# Patient Record
Sex: Female | Born: 1955 | Race: Black or African American | Hispanic: No | Smoking: Former smoker
Health system: Southern US, Community
[De-identification: ages and names within clinical notes are randomized; demographics above are authoritative.]

## PROBLEM LIST (undated history)

## (undated) DIAGNOSIS — I10 Essential (primary) hypertension: Secondary | ICD-10-CM

## (undated) DIAGNOSIS — E785 Hyperlipidemia, unspecified: Secondary | ICD-10-CM

## (undated) DIAGNOSIS — G4730 Sleep apnea, unspecified: Secondary | ICD-10-CM

## (undated) DIAGNOSIS — Z973 Presence of spectacles and contact lenses: Secondary | ICD-10-CM

## (undated) DIAGNOSIS — M1990 Unspecified osteoarthritis, unspecified site: Secondary | ICD-10-CM

## (undated) DIAGNOSIS — E119 Type 2 diabetes mellitus without complications: Secondary | ICD-10-CM

## (undated) DIAGNOSIS — K219 Gastro-esophageal reflux disease without esophagitis: Secondary | ICD-10-CM

## (undated) DIAGNOSIS — E669 Obesity, unspecified: Secondary | ICD-10-CM

## (undated) DIAGNOSIS — IMO0002 Reserved for concepts with insufficient information to code with codable children: Secondary | ICD-10-CM

## (undated) DIAGNOSIS — E0500 Thyrotoxicosis with diffuse goiter without thyrotoxic crisis or storm: Secondary | ICD-10-CM

## (undated) DIAGNOSIS — C50919 Malignant neoplasm of unspecified site of unspecified female breast: Secondary | ICD-10-CM

## (undated) DIAGNOSIS — IMO0001 Reserved for inherently not codable concepts without codable children: Secondary | ICD-10-CM

## (undated) DIAGNOSIS — E860 Dehydration: Secondary | ICD-10-CM

## (undated) HISTORY — PX: TOTAL THYROIDECTOMY: SHX2547

## (undated) HISTORY — DX: Obesity, unspecified: E66.9

## (undated) HISTORY — DX: Dehydration: E86.0

## (undated) HISTORY — DX: Hyperlipidemia, unspecified: E78.5

## (undated) HISTORY — DX: Unspecified osteoarthritis, unspecified site: M19.90

## (undated) HISTORY — DX: Essential (primary) hypertension: I10

---

## 1983-01-14 HISTORY — PX: TUBAL LIGATION: SHX77

## 2000-05-15 ENCOUNTER — Inpatient Hospital Stay (HOSPITAL_COMMUNITY): Admission: AD | Admit: 2000-05-15 | Discharge: 2000-05-20 | Payer: Self-pay | Admitting: Family Medicine

## 2000-05-16 ENCOUNTER — Encounter: Payer: Self-pay | Admitting: Family Medicine

## 2000-09-04 ENCOUNTER — Encounter: Payer: Self-pay | Admitting: Orthopedic Surgery

## 2000-09-04 ENCOUNTER — Encounter: Admission: RE | Admit: 2000-09-04 | Discharge: 2000-09-04 | Payer: Self-pay | Admitting: Orthopedic Surgery

## 2000-09-28 ENCOUNTER — Encounter (HOSPITAL_COMMUNITY): Admission: RE | Admit: 2000-09-28 | Discharge: 2000-10-28 | Payer: Self-pay | Admitting: Orthopedic Surgery

## 2001-01-13 DIAGNOSIS — M1990 Unspecified osteoarthritis, unspecified site: Secondary | ICD-10-CM

## 2001-01-13 HISTORY — DX: Unspecified osteoarthritis, unspecified site: M19.90

## 2001-01-13 HISTORY — PX: TOTAL KNEE ARTHROPLASTY: SHX125

## 2001-01-18 ENCOUNTER — Inpatient Hospital Stay (HOSPITAL_COMMUNITY): Admission: RE | Admit: 2001-01-18 | Discharge: 2001-01-21 | Payer: Self-pay | Admitting: Orthopedic Surgery

## 2001-01-21 ENCOUNTER — Inpatient Hospital Stay (HOSPITAL_COMMUNITY)
Admission: RE | Admit: 2001-01-21 | Discharge: 2001-02-01 | Payer: Self-pay | Admitting: Physical Medicine & Rehabilitation

## 2001-03-18 ENCOUNTER — Encounter (HOSPITAL_COMMUNITY): Admission: RE | Admit: 2001-03-18 | Discharge: 2001-04-17 | Payer: Self-pay | Admitting: Orthopedic Surgery

## 2001-04-16 ENCOUNTER — Encounter (HOSPITAL_COMMUNITY): Admission: RE | Admit: 2001-04-16 | Discharge: 2001-05-16 | Payer: Self-pay | Admitting: Orthopedic Surgery

## 2001-05-20 ENCOUNTER — Encounter (HOSPITAL_COMMUNITY): Admission: RE | Admit: 2001-05-20 | Discharge: 2001-06-19 | Payer: Self-pay | Admitting: Orthopedic Surgery

## 2001-06-29 ENCOUNTER — Encounter (HOSPITAL_COMMUNITY): Admission: RE | Admit: 2001-06-29 | Discharge: 2001-07-29 | Payer: Self-pay | Admitting: Orthopedic Surgery

## 2002-07-03 ENCOUNTER — Emergency Department (HOSPITAL_COMMUNITY): Admission: EM | Admit: 2002-07-03 | Discharge: 2002-07-03 | Payer: Self-pay | Admitting: *Deleted

## 2005-01-13 HISTORY — PX: COLONOSCOPY: SHX174

## 2005-05-26 ENCOUNTER — Ambulatory Visit: Payer: Self-pay | Admitting: Cardiology

## 2005-05-26 ENCOUNTER — Observation Stay (HOSPITAL_COMMUNITY): Admission: AD | Admit: 2005-05-26 | Discharge: 2005-05-28 | Payer: Self-pay | Admitting: Family Medicine

## 2005-07-04 ENCOUNTER — Ambulatory Visit: Payer: Self-pay | Admitting: Internal Medicine

## 2005-07-23 ENCOUNTER — Ambulatory Visit (HOSPITAL_COMMUNITY): Admission: RE | Admit: 2005-07-23 | Discharge: 2005-07-23 | Payer: Self-pay | Admitting: Internal Medicine

## 2005-07-23 ENCOUNTER — Ambulatory Visit: Payer: Self-pay | Admitting: Internal Medicine

## 2005-07-29 ENCOUNTER — Ambulatory Visit (HOSPITAL_COMMUNITY): Admission: RE | Admit: 2005-07-29 | Discharge: 2005-07-29 | Payer: Self-pay | Admitting: Internal Medicine

## 2005-08-09 ENCOUNTER — Emergency Department (HOSPITAL_COMMUNITY): Admission: EM | Admit: 2005-08-09 | Discharge: 2005-08-10 | Payer: Self-pay | Admitting: Emergency Medicine

## 2005-08-15 ENCOUNTER — Ambulatory Visit: Payer: Self-pay | Admitting: Internal Medicine

## 2005-11-21 ENCOUNTER — Emergency Department (HOSPITAL_COMMUNITY): Admission: EM | Admit: 2005-11-21 | Discharge: 2005-11-21 | Payer: Self-pay | Admitting: Emergency Medicine

## 2006-03-30 ENCOUNTER — Encounter: Admission: RE | Admit: 2006-03-30 | Discharge: 2006-03-30 | Payer: Self-pay | Admitting: Specialist

## 2006-04-14 ENCOUNTER — Ambulatory Visit (HOSPITAL_COMMUNITY): Payer: Self-pay | Admitting: Oncology

## 2006-04-14 ENCOUNTER — Encounter (HOSPITAL_COMMUNITY): Admission: RE | Admit: 2006-04-14 | Discharge: 2006-05-14 | Payer: Self-pay | Admitting: Oncology

## 2006-05-15 ENCOUNTER — Encounter (HOSPITAL_COMMUNITY): Admission: RE | Admit: 2006-05-15 | Discharge: 2006-06-14 | Payer: Self-pay | Admitting: Oncology

## 2006-06-11 ENCOUNTER — Ambulatory Visit (HOSPITAL_COMMUNITY): Payer: Self-pay | Admitting: Oncology

## 2006-08-15 ENCOUNTER — Emergency Department (HOSPITAL_COMMUNITY): Admission: EM | Admit: 2006-08-15 | Discharge: 2006-08-16 | Payer: Self-pay | Admitting: Emergency Medicine

## 2007-01-14 HISTORY — PX: COLONOSCOPY W/ POLYPECTOMY: SHX1380

## 2007-09-01 ENCOUNTER — Emergency Department (HOSPITAL_COMMUNITY): Admission: EM | Admit: 2007-09-01 | Discharge: 2007-09-01 | Payer: Self-pay | Admitting: Emergency Medicine

## 2008-03-07 ENCOUNTER — Ambulatory Visit: Payer: Self-pay | Admitting: Cardiology

## 2008-03-07 ENCOUNTER — Inpatient Hospital Stay (HOSPITAL_COMMUNITY): Admission: EM | Admit: 2008-03-07 | Discharge: 2008-03-14 | Payer: Self-pay | Admitting: Emergency Medicine

## 2008-03-13 ENCOUNTER — Encounter: Payer: Self-pay | Admitting: Cardiology

## 2009-11-19 ENCOUNTER — Emergency Department (HOSPITAL_COMMUNITY): Admission: EM | Admit: 2009-11-19 | Discharge: 2009-11-19 | Payer: Self-pay | Admitting: Emergency Medicine

## 2009-11-29 ENCOUNTER — Encounter (HOSPITAL_COMMUNITY)
Admission: RE | Admit: 2009-11-29 | Discharge: 2009-12-29 | Payer: Self-pay | Source: Home / Self Care | Attending: Orthopedic Surgery | Admitting: Orthopedic Surgery

## 2010-02-03 ENCOUNTER — Encounter (HOSPITAL_COMMUNITY): Payer: Self-pay | Admitting: Oncology

## 2010-03-26 LAB — BASIC METABOLIC PANEL
BUN: 25 mg/dL — ABNORMAL HIGH (ref 6–23)
CO2: 28 mEq/L (ref 19–32)
Calcium: 9.8 mg/dL (ref 8.4–10.5)
Chloride: 101 mEq/L (ref 96–112)
GFR calc Af Amer: >60 mL/min (ref >60)
Potassium: 3.6 mEq/L (ref 3.5–5.1)
Sodium: 139 mEq/L (ref 135–145)

## 2010-03-26 LAB — DIFFERENTIAL
Eosinophils Absolute: 0.2 10*3/uL (ref 0.0–0.7)
Eosinophils Relative: 3 % (ref 0–5)
Lymphs Abs: 1.8 10*3/uL (ref 0.7–4.0)

## 2010-03-26 LAB — CBC
Hemoglobin: 10.4 g/dL — ABNORMAL LOW (ref 12.0–15.0)
MCH: 27.7 pg (ref 26.0–34.0)
RBC: 3.77 MIL/uL — ABNORMAL LOW (ref 3.87–5.11)
WBC: 5.9 10*3/uL (ref 4.0–10.5)

## 2010-03-26 LAB — URINALYSIS, ROUTINE W REFLEX MICROSCOPIC
Bilirubin Urine: NEGATIVE
Glucose, UA: NEGATIVE mg/dL
Ketones, ur: NEGATIVE mg/dL
Specific Gravity, Urine: 1.025 (ref 1.005–1.030)

## 2010-04-25 LAB — CBC
MCHC: 33.3 g/dL (ref 30.0–36.0)
MCV: 72.7 fL — ABNORMAL LOW (ref 78.0–100.0)
Platelets: 208 10*3/uL (ref 150–400)
WBC: 4.5 10*3/uL (ref 4.0–10.5)

## 2010-04-25 LAB — DIFFERENTIAL
Basophils Relative: 1 % (ref 0–1)
Eosinophils Absolute: 0.2 10*3/uL (ref 0.0–0.7)
Eosinophils Relative: 5 % (ref 0–5)
Lymphocytes Relative: 31 % (ref 12–46)
Lymphs Abs: 1.4 10*3/uL (ref 0.7–4.0)
Monocytes Absolute: 0.7 10*3/uL (ref 0.1–1.0)
Monocytes Relative: 15 % — ABNORMAL HIGH (ref 3–12)
Neutro Abs: 2.2 10*3/uL (ref 1.7–7.7)

## 2010-04-25 LAB — GLUCOSE, CAPILLARY
Glucose-Capillary: 128 mg/dL — ABNORMAL HIGH (ref 70–99)
Glucose-Capillary: 138 mg/dL — ABNORMAL HIGH (ref 70–99)
Glucose-Capillary: 138 mg/dL — ABNORMAL HIGH (ref 70–99)
Glucose-Capillary: 179 mg/dL — ABNORMAL HIGH (ref 70–99)
Glucose-Capillary: 212 mg/dL — ABNORMAL HIGH (ref 70–99)

## 2010-04-25 LAB — BASIC METABOLIC PANEL
CO2: 25 mEq/L (ref 19–32)
GFR calc Af Amer: >60 mL/min (ref >60)
Glucose, Bld: 138 mg/dL — ABNORMAL HIGH (ref 70–99)
Sodium: 136 mEq/L (ref 135–145)

## 2010-04-30 LAB — BASIC METABOLIC PANEL
BUN: 10 mg/dL (ref 6–23)
CO2: 25 mEq/L (ref 19–32)
Calcium: 9.3 mg/dL (ref 8.4–10.5)
Chloride: 105 mEq/L (ref 96–112)
Chloride: 107 mEq/L (ref 96–112)
Chloride: 107 mEq/L (ref 96–112)
Creatinine, Ser: 0.69 mg/dL (ref 0.4–1.2)
Creatinine, Ser: 0.81 mg/dL (ref 0.4–1.2)
GFR calc Af Amer: >60 mL/min (ref >60)
GFR calc Af Amer: >60 mL/min (ref >60)
GFR calc non Af Amer: >60 mL/min (ref >60)
GFR calc non Af Amer: >60 mL/min (ref >60)
Potassium: 3.9 mEq/L (ref 3.5–5.1)
Potassium: 4 mEq/L (ref 3.5–5.1)
Sodium: 136 mEq/L (ref 135–145)
Sodium: 137 mEq/L (ref 135–145)

## 2010-04-30 LAB — DIFFERENTIAL
Basophils Absolute: 0 10*3/uL (ref 0.0–0.1)
Basophils Relative: 0 % (ref 0–1)
Basophils Relative: 0 % (ref 0–1)
Eosinophils Absolute: 0.1 10*3/uL (ref 0.0–0.7)
Eosinophils Absolute: 0.2 10*3/uL (ref 0.0–0.7)
Eosinophils Relative: 4 % (ref 0–5)
Lymphocytes Relative: 41 % (ref 12–46)
Lymphocytes Relative: 41 % (ref 12–46)
Lymphs Abs: 1.7 10*3/uL (ref 0.7–4.0)
Lymphs Abs: 2.6 10*3/uL (ref 0.7–4.0)
Lymphs Abs: 2.8 10*3/uL (ref 0.7–4.0)
Monocytes Absolute: 0.6 10*3/uL (ref 0.1–1.0)
Monocytes Absolute: 0.7 10*3/uL (ref 0.1–1.0)
Monocytes Relative: 13 % — ABNORMAL HIGH (ref 3–12)
Monocytes Relative: 14 % — ABNORMAL HIGH (ref 3–12)
Monocytes Relative: 9 % (ref 3–12)
Neutro Abs: 2.8 10*3/uL (ref 1.7–7.7)
Neutro Abs: 3 10*3/uL (ref 1.7–7.7)
Neutro Abs: 5.6 10*3/uL (ref 1.7–7.7)
Neutrophils Relative %: 42 % — ABNORMAL LOW (ref 43–77)
Neutrophils Relative %: 44 % (ref 43–77)

## 2010-04-30 LAB — GLUCOSE, CAPILLARY
Glucose-Capillary: 107 mg/dL — ABNORMAL HIGH (ref 70–99)
Glucose-Capillary: 129 mg/dL — ABNORMAL HIGH (ref 70–99)
Glucose-Capillary: 131 mg/dL — ABNORMAL HIGH (ref 70–99)
Glucose-Capillary: 136 mg/dL — ABNORMAL HIGH (ref 70–99)
Glucose-Capillary: 144 mg/dL — ABNORMAL HIGH (ref 70–99)
Glucose-Capillary: 147 mg/dL — ABNORMAL HIGH (ref 70–99)
Glucose-Capillary: 155 mg/dL — ABNORMAL HIGH (ref 70–99)
Glucose-Capillary: 155 mg/dL — ABNORMAL HIGH (ref 70–99)
Glucose-Capillary: 175 mg/dL — ABNORMAL HIGH (ref 70–99)
Glucose-Capillary: 205 mg/dL — ABNORMAL HIGH (ref 70–99)
Glucose-Capillary: 217 mg/dL — ABNORMAL HIGH (ref 70–99)
Glucose-Capillary: 229 mg/dL — ABNORMAL HIGH (ref 70–99)
Glucose-Capillary: 256 mg/dL — ABNORMAL HIGH (ref 70–99)
Glucose-Capillary: 93 mg/dL (ref 70–99)

## 2010-04-30 LAB — COMPREHENSIVE METABOLIC PANEL
ALT: 16 U/L (ref 0–35)
ALT: 19 U/L (ref 0–35)
Albumin: 3.5 g/dL (ref 3.5–5.2)
Alkaline Phosphatase: 110 U/L (ref 39–117)
Alkaline Phosphatase: 130 U/L — ABNORMAL HIGH (ref 39–117)
CO2: 27 mEq/L (ref 19–32)
Chloride: 99 mEq/L (ref 96–112)
Creatinine, Ser: 0.69 mg/dL (ref 0.4–1.2)
Potassium: 3.5 mEq/L (ref 3.5–5.1)
Potassium: 3.5 mEq/L (ref 3.5–5.1)
Sodium: 139 mEq/L (ref 135–145)
Sodium: 139 mEq/L (ref 135–145)
Total Protein: 7 g/dL (ref 6.0–8.3)

## 2010-04-30 LAB — URINE CULTURE: Special Requests: NEGATIVE

## 2010-04-30 LAB — CBC
HCT: 29.1 % — ABNORMAL LOW (ref 36.0–46.0)
HCT: 38.5 % (ref 36.0–46.0)
Hemoglobin: 9.7 g/dL — ABNORMAL LOW (ref 12.0–15.0)
MCHC: 32.9 g/dL (ref 30.0–36.0)
MCHC: 33.2 g/dL (ref 30.0–36.0)
MCV: 72.5 fL — ABNORMAL LOW (ref 78.0–100.0)
Platelets: 249 10*3/uL (ref 150–400)
RBC: 4.02 MIL/uL (ref 3.87–5.11)
RBC: 4.77 MIL/uL (ref 3.87–5.11)
RDW: 18.8 % — ABNORMAL HIGH (ref 11.5–15.5)
WBC: 4.4 10*3/uL (ref 4.0–10.5)
WBC: 6.4 10*3/uL (ref 4.0–10.5)

## 2010-04-30 LAB — CULTURE, BLOOD (ROUTINE X 2)
Culture: NO GROWTH
Culture: NO GROWTH
Report Status: 2282010

## 2010-04-30 LAB — HEPATIC FUNCTION PANEL
AST: 25 U/L (ref 0–37)
Albumin: 3.7 g/dL (ref 3.5–5.2)
Alkaline Phosphatase: 103 U/L (ref 39–117)
Total Bilirubin: 0.6 mg/dL (ref 0.3–1.2)
Total Protein: 7.2 g/dL (ref 6.0–8.3)

## 2010-04-30 LAB — LIPASE, BLOOD: Lipase: 41 U/L (ref 11–59)

## 2010-04-30 LAB — PROTIME-INR: Prothrombin Time: 14.8 seconds (ref 11.6–15.2)

## 2010-05-28 NOTE — Consult Note (Signed)
[name], [name]              ACCOUNT NO.:  [id number]   MEDICAL RECORD NO.:  [id number]          PATIENT TYPE:  INP   LOCATION:  A320                          FACILITY:  [facility]   PHYSICIAN:  Gerrit Friends. Dietrich Pates, MD, FACCDATE OF BIRTH:  [birth year]   DATE OF CONSULTATION:  03/13/2008  DATE OF DISCHARGE:                                 CONSULTATION   REASON FOR CONSULTATION:  Uncontrolled hypertension.   HISTORY OF PRESENT ILLNESS:  [name] is a 55 year old female patient  with a history of hypertension admitted to [facility] on  March 07, 2008, with complaints of abdominal pain secondary as  partial small-bowel obstruction felt to be caused by an umbilical  hernia.  Her small bowel obstruction has resolved.  She has had  uncontrolled hypertension since admission to the hospital on February  23.  General surgery has seen the patient but has deferred surgery to  correct her umbilical hernia secondary to uncontrolled hypertension.  She denies any headache, blurry vision, chest pain, shortness of breath  or syncope.  She has been recently placed on Tapazole secondary to  hyperthyroidism.   CURRENT MEDICATIONS:  1. Atenolol 100 mg b.i.d.  2. Benazepril 40 mg daily.  3. Clonidine 0.2 mg three times a day.  4. NitroPaste one inch q.6 h.  5. Potassium 20 mEq daily, verapamil 240 mg daily.  6. HCTZ 25 mg daily.   We are now asked to further evaluate and help manage her hypertension.   PAST MEDICAL HISTORY:  1. She reportedly has a normal heart catheterization in 1995.  She was      evaluated for chest pain in May 2007, and an echocardiogram at that      time demonstrated an EF of 65-70%, moderate concentric LVH, mild      mitral regurgitation.  Myoview study at that time demonstrated no      ischemia, no scar, and an EF of 58%.  2. Hypertension.  3. Diabetes mellitus.  4. Asthma.  5. Microcytic anemia secondary to iron deficiency.  History of      colonoscopy and  EGD as well as Givens capsule endoscopy in 2007      without bleeding source identified.  6. Osteoarthritis.  7. Anxiety.  8. Hyperthyroidism, currently on methimazole.  9. 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   MEDICATIONS AT HOME:  1. Atenolol 100 mg b.i.d.  2. Benazepril 40 mg daily.  3. Methimazole 20 mg daily.  4. Clonidine 0.2 mg b.i.d.  5. HCTZ 25 mg daily.  6. Metformin 500 mg b.i.d.   ALLERGIES:  PROCARDIA.   SOCIAL HISTORY:  The patient lives in [place] with her two daughter.  She is disabled and divorced.  She has a 20+ pack-year history of  smoking a good 15 years ago.  She denies alcohol abuse.   FAMILY HISTORY:  Significant for pancreatic cancer in her mother who  died at age 59 and lung cancer in her father who died at age 60.   REVIEW OF SYSTEMS:  Please see HPI.  She denies fevers, chills,  headache, sore throat rash, dysuria, hematuria, bright red blood per  rectum or melena.  She notes chronic dyspnea with exertion and describes  NYHA class IIB to III symptoms.  She denies PND.  She sleeps on three  pillows chronically without change.  She notes lower extremity edema  that is chronic.  She denies syncope or near syncope.  Rest of the  review of systems are negative.   PHYSICAL EXAMINATION:  GENERAL:  She is a well-nourished, well-developed  female in no acute distress.  VITAL SIGNS:  Blood pressure has ranged anywhere from 167/73 to 206/95,  pulse 78, respirations 18, temperature 97.9, oxygen saturation 100% on  room air, weight 115 kg.  HEENT:  Normal except for exophthalmos.  NECK:  Without JVD, lymphadenopathy.  ENDOCRINE:  Positive thyromegaly with the right lobe being greater than  left.  CARDIAC:  Normal S1, S2.  Regular rate and rhythm without murmur.  LUNGS:  Clear to auscultation bilaterally without wheezing, rhonchi or  rales.  SKIN:  Without rash.  ABDOMEN:  Soft, nontender with no bowel sounds, no organomegaly.  No   bruits noted.  EXTREMITIES:  With trace edema bilaterally.  MUSCULOSKELETAL:  Without joint deformity.  She has a positive scar over  her left knee anteriorly.  NEUROLOGIC:  She is alert and oriented x3.  Cranial nerves II-XII  grossly intact.  VASCULAR:  Exam without carotid bruits bilaterally.   STUDIES:  Abdominal pelvic CT demonstrates partial small-bowel  obstruction.  Of note, her adrenal glands were normal and her kidneys  were normal except for 1.5 cm cyst on the left kidney.  EKG is  unavailable.   LABORATORY DATA:  White count 4500, hemoglobin 9.9, hematocrit 29.7, MCV  72.7, platelet count 208,000, sodium 136, potassium 3.8, BUN 8,  creatinine 0.75, glucose 138, total bilirubin 0.6, alkaline phosphatase  103, AST 25, ALT 17, albumin 3.7, TSH less than 0.004, T 4 13.4, INR  1.1.   IMPRESSION:  1. Uncontrolled hypertension in the setting of partial small small-      bowel obstruction - now resolved.  2. Nonischemic Myoview study May 2007.  3. History of good LV function.  4. Diabetes mellitus.  5. Hyperthyroidism currently on methimazole therapy.  6. Microcytic anemia with negative workup in the past.  7. Asthma.   RECOMMENDATIONS:  The patient was also interviewed and examined by Dr.  Dietrich Pates.  She has had longstanding hypertension that has never been  well controlled.  It remains uncontrolled at this time despite multiple  drugs.  Her noncardiac illness and stress of hospitalization may be  contributing to her issue with her blood pressure.  It does not appear  that she is tolerating p.o. clonidine.  We will resume her clonidine  patch and increase the dosage.  We will add a second calcium channel  blocker by adding amlodipine.  Her atenolol will be switched to  labetalol to cover her with a nonselective beta blocker for better  control of her blood pressure.  Hydralazine has also been added at 50 mg  three times a day.  Nitrates are ineffective and these will be   discontinued.  Antihypertensive regimen can be adjusted after discharge.  It is obtained at this time that she is ready for discharge to home.  Thank you very much for the consultation.  We will be glad to follow the  patient throughout the remainder of this admission.      Tereso Newcomer, PA-C  Gerrit Friends. Dietrich Pates, MD, [facility]  Electronically Signed    SW/MEDQ  D:  03/14/2008  T:  03/14/2008  Job:  [id number]   cc:   Donna Bernard, M.D.  Fax: [phone]

## 2010-05-28 NOTE — Group Therapy Note (Signed)
[name], [name]              ACCOUNT NO.:  [id number]   MEDICAL RECORD NO.:  [id number]          PATIENT TYPE:  INP   LOCATION:  A320                          FACILITY:  [facility]   PHYSICIAN:  Dorris Singh, DO    DATE OF BIRTH:  [birth year]   DATE OF PROCEDURE:  03/12/2008  DATE OF DISCHARGE:                                 PROGRESS NOTE   The patient is seen today.  She states that she feels pretty good.  She  does not have any concerns.  She has been able to eat and her __________  came out so she has not had any issues.  There is some concern however  about her blood pressure, it has been uncontrolled.  So that is what we  are dealing with currently. The patient did mention the concern with  Graves disease and she has needed to go to an endocrinologist and has an  on the past due to finances. I talked with her extensively, stating that  she probably needed to do that.  We will go ahead and get a TSH, but I  told her that most of this may need to be followed up as an outpatient,   PHYSICAL EXAMINATION:  VITAL SIGNS:  Her vitals are as follows;  temperature 97.9, pulse 73, respirations 18, blood pressure 186/73.  GENERAL:  The patient is a 55 year old African American female who is  well-developed, well-nourished in no acute distress.  HEENT:  Eyes have exophthalmos.  NECK:  Does have a goiter.  HEART:  Regular rate and rhythm.  LUNGS:  Clear auscultation bilaterally.  ABDOMEN:  Soft, nontender.  EXTREMITIES:  Positive pulses.  No edema, ecchymosis, or cyanosis.   LABS:  Her labs are as follows:  White count 4.4, hemoglobin 9.7,  hematocrit 29.1, platelet count of 206. Sodium 137, potassium 4.,  chloride 107, CO2 of 25, glucose 133, BUN 8, and creatinine 0.81.   ASSESSMENT/PLAN:  1. Uncontrolled hypertension.  We will go ahead and adjust the      medications, hopefully we can get it under control for her to be      discharged.  2. Also check a TSH.  This can be followed up  outpatient with Dr.      Gerda Diss and they need to make a decisions if she needs to go to an      endocrinologist or not. We will continue to monitor patient and      make changes as necessary.      Dorris Singh, DO  Electronically Signed     CB/MEDQ  D:  03/12/2008  T:  03/12/2008  Job:  [id number]

## 2010-05-28 NOTE — H&P (Signed)
[name], [name]              ACCOUNT NO.:  [id number]   MEDICAL RECORD NO.:  [id number]          PATIENT TYPE:  INP   LOCATION:  A320                          FACILITY:  [facility]   PHYSICIAN:  Dorris Singh, DO    DATE OF BIRTH:  December 02, 1955   DATE OF ADMISSION:  03/07/2008  DATE OF DISCHARGE:  LH                              HISTORY & PHYSICAL   CHIEF COMPLAINT:  The patient is a 55 year old African-American female  who presented to the emergency room with chief complaint of abdominal  pain.   PRIMARY CARE PHYSICIAN:  Lilyan Punt, M.D.   HISTORY OF PRESENT ILLNESS:  The patient said the abdominal pain started  today. The onset was acute and the pattern was consistent and it was  getting worse. It was located below the abdomen and had no radiation. It  was a 6 out of 10 at its worse. It is described as moderate. Nothing  made it better and nothing aggravated it.   PAST MEDICAL HISTORY:  1. Asthma.  2. Hypertension.  3. Diabetes mellitus.  4. Arthritis.  5. Morbid obesity.  6. History of bursitis in the right arm.  7. Chronic pain left knee.   SOCIAL HISTORY:  Nonsmoker, nondrinker. Lives with daughter. No drug  abuse.   ALLERGIES:  NO KNOWN DRUG ALLERGIES. She has had some reaction to some  kind of blood pressure pill without any other explanation.   MEDICATIONS:  1. Atenolol 100 mg b.i.d.  2. Metformin 500 mg b.i.d.  3. HCTZ 25 mg once daily.  4. Benazepril 40 mg once daily.  5. Iron, no dose given, three times daily.  6. Clonidine 0.2 mg b.i.d.  7. Thiamazole 10 mg 2 tabs daily.   REVIEW OF SYSTEMS:  CONSTITUTIONAL:  Negative for fever, chills, or  appetite changes. HEENT:  Eyes negative for eye pain. Ears, nose, mouth,  and throat negative for ear pain, changes in voice, or sore throat.  CARDIOVASCULAR:  Negative for chest pain. RESPIRATORY:  Negative for  cough, wheezing, or shortness of breath. GASTROINTESTINAL:  Positive for  nausea. GENITOURINARY:   Negative for dysuria. MUSCULOSKELETAL:  Negative  for arthralgias but chronic pain in knee. SKIN:  Negative for pruritus.  NEUROLOGIC:  Negative for headaches or loss of consciousness.   PHYSICAL EXAMINATION:  VITAL SIGNS:  Blood pressure 211/104, temperature  98.8, pulse 73, respiratory rate 20, pulse ox 89% on room air.  GENERAL:  A 55 year old African-American female who looks older than her  stated age. Well developed, well nourished. No acute distress.  HEENT:  Normocephalic and atraumatic. Eyes, EO MI. No scleral icterus or  conjunctival injection. Mouth, there is no erythema. Teeth are in good  repair.  NECK:  Supple. No lymphadenopathy.  HEART:  Regular rate and rhythm. No S1 or S2 present. No gallops, rubs,  or murmurs.  LUNGS:  Clear to auscultation bilaterally. No wheezes, rales, or  rhonchi.  ABDOMEN:  Round. Soft, nontender, and nondistended. This is after pain  medication administration.  EXTREMITIES:  Positive pulses. No edema, ecchymosis, or cyanosis.  NEUROLOGIC:  Cranial nerves 2-12  are grossly intact.   DIAGNOSTIC STUDIES:  The patient had a CT of the pelvis and abdomen,  which shows partial small bowel obstruction, particularly of the  jejunum. No specific etiologies determined. No free air of the pelvis.  Small amount of fluid in the pelvis. No pelvic pathology is evident.   LABORATORY DATA:  Lipase 41, sodium 139, potassium 3.5, chloride 99, CO2  27, glucose 150, BUN 13, creatinine 0.69. White count 6.8. Hemoglobin  and hematocrit 12.7 and 38.5. Platelet count 275,000. Glucose 155.   ASSESSMENT:  1. PARTIAL SMALL BOWEL OBSTRUCTION.  2. DIABETES MELLITUS.  3. HYPERTENSION:  Uncontrolled.   PLAN:  Admit the patient to the service of [facility] until March 08, 2008, in which she will be transferred back to Dr. Fletcher Anon service.  Will provide bedside commode. We will place a Foley is needed and we  will keep her saturations above 98%. Due to the small bowel  obstruction,  will go ahead and make her NPO and place NG tube to intermittent  suction, where she will be NPO except for medications. Will place her on  IV hydration at 125 cc of normal saline. Will get repeat labs in the  morning. Will do DVT and GI prophylaxis and also anti-emetics. Will  cover her with her glycemic protocol and put her on antibiotics to  include Flagyl and Cipro. Due to her blood pressure being extremely out  of control, will give her her home medications, as well as switching her  Clonidine to a patch and giving Lopressor as needed. Will consult Dr.  Isaias Sakai group for small bowel obstruction and we will continue  to monitor her and make changes as necessary.      Dorris Singh, DO  Electronically Signed     CB/MEDQ  D:  03/08/2008  T:  03/08/2008  Job:  [id number]

## 2010-05-28 NOTE — Group Therapy Note (Signed)
[name], [name]              ACCOUNT NO.:  [id number]   MEDICAL RECORD NO.:  [id number]          PATIENT TYPE:  INP   LOCATION:  A320                          FACILITY:  [facility]   PHYSICIAN:  Scott A. Gerda Diss, MD    DATE OF BIRTH:  June 15, 1955   DATE OF PROCEDURE:  DATE OF DISCHARGE:                                 PROGRESS NOTE   She was admitted in with partial small bowel obstruction yesterday.  Her  abdominal pain is still moderate.  She was vomiting early this morning.  Her NG tube came out.  No diarrhea.   On examination, lungs clear.  Heart regular. Abdomen soft, minimal to  moderate midepigastric pain.  We will talk with surgeons today and have  them see her and then progress from there what needs to be done.  Ice  chips will be allowed.  Her labs look good, repeat labs again tomorrow.  Blood pressure still running high, using Lopressor help to control it  along with clonidine, may need other measures.      Scott A. Gerda Diss, MD  Electronically Signed     SAL/MEDQ  D:  03/08/2008  T:  03/08/2008  Job:  [id number]

## 2010-05-31 NOTE — Consult Note (Signed)
NAMEKIMBRIA, [name]              ACCOUNT NO.:  [id number]   MEDICAL RECORD NO.:  [id number]          PATIENT TYPE:  INP   LOCATION:  A214                          FACILITY:  [facility]   PHYSICIAN:  [place] Bing, M.D. [facility] OF BIRTH:  July 27, 1955   DATE OF CONSULTATION:  05/26/2005  DATE OF DISCHARGE:                                   CONSULTATION   REFERRING PHYSICIAN:  Dr. Lubertha South   HISTORY OF PRESENT ILLNESS:  A 55 year old woman referred for evaluation of  chest pain.  Ms. [name] has multiple cardiovascular risk factors, but no  known coronary disease.  She underwent coronary angiography in 1995 and was  said to have entirely normal results.  At that time she presented with  exertional chest discomfort.  She was started on antihypertensive  medications with resolution of her symptoms until the last few weeks when  she has again noted exertional chest tightness and dyspnea.  There has been  no prominent diaphoresis.  She has had no nausea nor emesis.  Symptoms are  promptly relieved with rest.  She has never been given nitroglycerin.  She  is unaware as to whether blood pressure control has been suboptimal  recently.   [name] has diabetes that has been controlled with modest oral regimen.  She has been told that her lipid profile is excellent.   Past medical history is otherwise notable for asthma, total left knee  replacement in January 2003 that was uncomplicated, anxiety, and  osteoarthritis.   The patient reports a previous adverse drug effect to [facility].  Her recent  medications include metformin 1000 mg b.i.d., benazepril/HCTZ 20/12.5 mg  daily, atenolol 50 mg b.i.d.   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  120-pack-year  history of cigarette smoking that was discontinued 14 years ago.  No  excessive alcohol.   FAMILY HISTORY:  Mother died at age 75 due to  pancreatic carcinoma; father  died at age 50 with lung carcinoma.  Four siblings are alive and well.   REVIEW OF SYSTEMS:  Notable for intermittent diaphoresis, nocturia,  continued arthralgias in her knees.   PHYSICAL EXAMINATION:  GENERAL:  Pleasant woman in no acute distress.  VITAL SIGNS:  The temperature is 98.7, blood pressure 95/95, heart rate 90  and regular, respirations 20.  HEENT:  Anicteric sclerae; normal oral mucosa.  NECK:  No jugular venous distension; normal carotid upstrokes without  bruits.  ENDOCRINE:  No thyromegaly.  HEMATOPOIETIC:  No adenopathy.  SKIN:  No significant lesions.  PSYCHIATRIC:  Alert and oriented; normal affect.  LUNGS:  Clear with decreased breath sounds at the bases.  CARDIAC:  Normal first and second heart sounds; fourth heart sound present.  ABDOMEN:  Obese; otherwise benign; no masses; no organomegaly.  EXTREMITIES:  Trace edema; distal pulses intact; scar over the anterior  surface of the left knee.   EKG:  Normal sinus rhythm; left atrial abnormality; delayed R-wave  progression.   Laboratory otherwise notable for a hemoglobin of 8.6, normal renal  function,  normal LFTs, and normal cardiac markers.   IMPRESSION:  [name] presents with recurrent symptoms very similar to  those she experienced 12 years ago when she had normal coronary angiography.  She has had an additional decade plus of significant cardiovascular risk  factors.  Certainly, ischemic heart disease is a diagnostic consideration.  She also has hypertension that is suboptimally controlled and newly  diagnosed anemia with microscopic findings suggesting possible hemolysis.  We will proceed with a stress test to evaluate for objective evidence of  ischemia.  Echocardiography will be the first imaging modality attempted.  If images are not adequate we will proceed with a stress nuclear study,  although her size is a disadvantage for diagnostic accuracy in that type of   examination as well.  She will require evaluation for anemia.  This  certainly could be a major contributor to her symptoms.  Additional  antihypertensive medication will be required as well.  We will add clonidine-  TTS at an intermediate dosage level.  We greatly appreciate the request for  consultation and will be happy to follow this nice woman with you.      [place] Bing, M.D. [facility]  Electronically Signed     RR/MEDQ  D:  05/26/2005  T:  05/27/2005  Job:  [id number]

## 2010-05-31 NOTE — H&P (Signed)
[place]. [facility]  Patient:    [name], [name]. Visit Number: [id number] MRN: [id number]          Service Type: Attending:  Carlisle Beers. Dorothyann Gibbs, M.D. Dictated by:   Arnoldo Morale, P.A. Adm. Date:  01/18/01                           History and Physical  DATE OF BIRTH:  [birth year].  CHIEF COMPLAINT:  Progressively worsening left knee pain since June 2002.  HISTORY OF PRESENT ILLNESS:  This 55 year old black female patient presented to Dr. Priscille Kluver with a history of a left knee arthroscopy for arthritis by Dr. Priscille Kluver in April 1997.  She did extremely well until she suffered a fall at work in June 2002, and she subsequently underwent a repeat arthroscopy of the that left knee on August 24, 2000, where she was found to have torn medial and lateral meniscus.  Since that time, the pain in her knee has been getting progressively worse and she has had no relief at all.  At this point, the pain is described as a constant ache which becomes burning at times and is located over the anteromedial aspect of the knee and also in the posterior aspect of the knee.  There is no other radiation of the pain. It increases with any walking and decreases with rest.  The knee catches at times and also pops.  She complains of it locking after she has sat for a prolonged period of time and then tries to get up.  She also has occasional swelling in the knee.  The pain also occasionally keeps her up at night.  She is currently taking Bextra 10 mg once a day for pain relief and that just provides a minimal amount of relief.  She has received cortisone injections in the past and they helped in 1997, but they have provided no relief at this time.  She does occasional use a cane to assist with ambulation.  ALLERGIES:  No known drug allergies.  CURRENT MEDICATIONS:  1. Metformin 500 mg 2 tablets p.o. b.i.d.  2. Cardura 8 mg 1 tablet p.o. q.d.  3. Lisinopril 20 mg 1 tablet  p.o. q.d.  4. Toprol XL 50 mg 1 tablet p.o. q.d.  5. Norvasc 5 mg 1 tablet p.o. b.i.d.  6. Hydrochlorothiazide 25 mg 1 tablet p.o. q.d.  7. Paxil 30 mg 1 tablet p.o. q.d.  8. Flovent inhaler 220 mcg 2 puffs inhaled b.i.d.  9. Albuterol inhaler 17 g 2 puffs inhaled as needed for asthma attack. 10. Bextra 10 mg 1 tablet p.o. q.d.  PAST MEDICAL HISTORY:  She was diagnosed with type 2 diabetes approximately nine years ago.  She has had hypertension for 20-30 years.  She has had asthma for the last six years and she does have a history of anxiety attacks treated with the Paxil for the last five years.  Her medical doctor is Dr. Lubertha South in [place].  She denies any history of thyroid disease, hiatal hernia, peptic ulcer disease, reflux disease, heart disease, or any other chronic medical condition, other than noted.  PAST SURGICAL HISTORY: 1. Cardiac catheterization in 1993. 2. Left knee arthroscopy in April 1997 by Dr. Jonny Ruiz L. Rendall. 3. Left knee arthroscopy with partial meniscectomies on August 24, 2000,    by Dr. Jonny Ruiz L. Rendall.  SOCIAL HISTORY:  She has a 42-pack-year history of cigarette smoking which  she quit 10 years ago.  She does not drink any alcohol nor use any drugs.  She is divorced and lives with her two daughters in a one-story house with six steps into the main entrance.  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  Her medical doctor is Dr. Lubertha South in [place], and his phone number is [phone].  FAMILY HISTORY:  Her mother died at the age of 23 with stomach and pancreatic cancer, hypertension, and diabetes.  Her father died at the age of 68 with lung cancer.  She has two brothers aged 60 and 46, and two sisters aged 82 and 76, and they are all alive and well.  Her daughters are aged 66 and 31 and they are both alive and healthy.  REVIEW OF SYSTEMS:  She does have a cough with her asthma and also  some complaints of tightness in her chest first thing in the morning that resolves with the use of her inhalers.  She was last hospitalized for her asthma in May 2002.  She does get dyspneic with stairs.  She complains of some bleeding gums when she brushes her teeth, but no other easy bleeding or bruising.  She reports frequent diarrhea and does have normally three bowel movements a day. Her last normal menstrual period was in November 2002, and she is a gravida 3, para 2-0-1-2.  She does complain of some right knee pain at this time.  She does wear glasses.  She does not have a living will nor a power of attorney. All other systems are negative and noncontributory.  PHYSICAL EXAMINATION:  GENERAL:  A well-developed, well-nourished, obese black female in no acute distress.  She talks easily with the examiner.  She walks with an antalgic gait and a limp on the left.  Mood and affect are appropriate.  Height is 5 feet 5-1/2 inches, weight 285 pounds.  BMI is 49.  VITAL SIGNS:  Temperature 98.2 degrees Fahrenheit, pulse 76, respirations 18, BP 152/94.  HEENT:  Normocephalic, atraumatic without frontal or maxillary sinus tenderness to palpation.  Conjunctivae pink.  Sclerae anicteric.  PERRLA. EOMs intact.  Funduscopic exam shows visible red reflex bilaterally with normal retinal vasculature, optic discs, and cup.  No visible external ear deformities.  Hearing grossly intact.  Tympanic membranes pearly gray bilaterally with good light reflex.  Nose and nasal septum midline.  Nasal mucosa pink and moist without exudates or polyps noted.  Buccal mucosa pink and moist.  Good dentition.  Pharynx without erythema or exudates.  Tongue and uvula midline.  Tongue without fasciculations and uvula rises equally with phonation.  NECK:  No visible masses or lesions noted.  Trachea midline.  No palpable lymphadenopathy nor thyromegaly.  Carotids +2 bilaterally without bruits. Full range of motion  and nontender to palpation along the cervical spine.  CARDIOVASCULAR:  Heart rate and rhythm regular.  S1, S2 present without rubs,  clicks, or murmurs noted.  RESPIRATORY:  Respirations even and unlabored.  No accessory muscle use at this time.  Breath sounds clear to auscultation bilaterally without rales or wheezes noted.  ABDOMEN:  She has a very large, rounded abdominal contour.  She does have a fist-sized pale area of discoloration on her upper right chest which she reports is a birthmark.  She does have scattered stretch marks noted across her abdomen.  Bowel sounds are present x4 quadrants.  She is soft and nontender to palpation without hepatosplenomegaly  nor CVA tenderness to palpation.  Nontender to palpation along the entire length of the vertebral column.  Femoral are +1 bilaterally.  BREASTS/GU/RECTAL:  These exams are deferred at this time.  MUSCULOSKELETAL:  No obvious deformities of the bilateral upper extremities with full range of motion of these extremities without pain.  Radial pulses +2 bilaterally.  She has flexion of both hips from 0-90 degrees.  Painless internal and external rotation that is equal bilaterally.  Full painless range of motion of her ankles and toes bilaterally.  DP and PT pulses are +1 bilaterally.  Right knee has full extension and flexion to 115 degrees, no crepitus.  She does complain of pain with palpation on both the medial and lateral joint line, but this is mild.  There is no effusion at this time, negative patellar apprehension and grind.  The left knee has full extension, but flexion only to 80 degrees.  She is acutely tender to palpation on both the medial and lateral joint line and has a +2 effusion at this time.  NEUROLOGIC:  Alert and oriented x3.  Cranial nerves II-XII are grossly intact. Strength 5/5 bilateral upper and lower extremities.  Rapid alternating movements intact.  Deep tendon reflexes 2+ bilateral upper and  lower extremities.  RADIOLOGIC FINDINGS:  X-rays taken on October 2002 of her left knee show bone-on-bone contact in the medial compartment at the left knee with significant spurring and sclerosis.  IMPRESSION: 1. End-stage osteoarthritis, left knee. 2. Obesity. 3. Type 2 diabetes mellitus. 4. Hypertension. 5. Asthma. 6. Anxiety attacks.  PLAN:  Ms. Nicolls will be admitted to [facility] on January 18, 2001, where she will undergo a left total knee arthroplasty by Dr. Jonny Ruiz L. Rendall. She will undergo all of the routine preoperative laboratory tests and studies prior to this procedure. Dictated by:   Arnoldo Morale, P.A. Attending:  Carlisle Beers. Dorothyann Gibbs, M.D. DD:  01/11/01 TD:  01/11/01 Job: [id number]

## 2010-05-31 NOTE — Op Note (Signed)
[place]. [facility]  Patient:    DEJAE, BERNET Visit Number: [id number] MRN: [id number]          Service Type: SUR Location: 5000 [id number] Attending Physician:  Carolan Shiver Ii Dictated by:   Carlisle Beers. Dorothyann Gibbs, M.D. Proc. Date: 01/18/01 Admit Date:  01/18/2001                             Operative Report  PREOPERATIVE DIAGNOSIS:  Osteoarthritis, left knee.  PROCEDURE:  Left LCS total knee replacement, fully cemented.  POSTOPERATIVE DIAGNOSIS:  Osteoarthritis, left knee.  SURGEON:  John L. Dorothyann Gibbs, M.D.  ASSISTANT:  Jamelle Rushing, P.A.-C  ANESTHESIA:  General.  PATHOLOGY:  The patient has very hard bone, and has bone against bone medial compartment with also a worn out patella.  DESCRIPTION OF PROCEDURE:  Under general anesthesia, the left leg is prepared with Duraprep and draped as a sterile field.  A high thigh tourniquet is applied sterilely, and the leg is wrapped out with an Esmarch, and the tourniquet elevated at 350 mm.  A midline incision is made, carried medial parapatellar deep.  At this point, an attempt to evert the patella is impossible due to the tourniquet sliding distally.  At this point, drapes that are all sterile are opened, the tourniquet is let down after rewrapping the leg with the Esmarch, and the tourniquet is reapplied.  It is sterile the whole time.  At this point, a new U drape is applied from below.  The patella is then easily everted after the tourniquet is back up in a higher position. The femur is sized to a standard.  Proximal tibial resection is carried out with the first tibial guide.  Using the first femoral guide, an intercondylar drill hole is placed.  Using the second guide, the anterior and posterior flare of the distal femur are resected with a 10 mm flexion gap.  The gap is balanced by release of the MCL from the tibia, allowing the tibia to swing in some external rotation.  The  intramedullary guide is then used, and a distal femoral cut is made, 10 mm extension gap matches the flexion gap very nicely. Recessing guide is then used.  Lamina spreaders are then inserted.  Remnants of the menisci and cruciates are resected.  Spurs are taken off of the back of the femoral condyles, and the knee is debrided.  Following this, the [facility] and [place] are inserted.  The proximal tibia is sized, and a 2.5 center peg hole is placed with a hole for fins.  Then a trial reduction of a 2.5 tibia, 10 mm rotating bearing, and standard femur reveals excellent fit, alignment, and stability.  The patella is then osteotomized, three peg holes were placed. The knee is felt to be stable with the trial patella in, and the patella is well centralized.  All trial components are removed.  The bony surfaces are prepared.  Due to the hard nature of the bone, we were afraid there would be very little impaction of the cement into the cancellous bone, so a number of holes were drilled in the hard surface of the femur and tibia, and in this manner, extra grip on the bony surfaces could be obtained.  Permanent components were then cemented in place, excess cement is removed after it is hardened.  The tourniquet is let down at 57 minutes.  Multiple small vessels  were cauterized.  The wound was then closed in layers with #1 surgidac, 0 and 2-0 Vicryl, and skin clips.  Operative time was approximately 1 hour 20 minutes.  Estimated blood loss was about 100 cc.  The patient was anemic prior to surgery, and consideration of starting blood in the near future is entertained.  Prosthesis was in excellent position and alignment, no difficulty in its application. Dictated by:   Carlisle Beers. Dorothyann Gibbs, M.D. Attending Physician:  Carolan Shiver Ii DD:  01/18/01 TD:  01/18/01 Job: [id number]

## 2010-05-31 NOTE — Op Note (Signed)
[name], [name]              ACCOUNT NO.:  [id number]   MEDICAL RECORD NO.:  [id number]          PATIENT TYPE:  AMB   LOCATION:  DAY                           FACILITY:  [facility]   PHYSICIAN:  R. Roetta Sessions, M.D. DATE OF BIRTH:  [birth year]   DATE OF PROCEDURE:  07/29/2005  DATE OF DISCHARGE:  07/29/2005                                 OPERATIVE REPORT   PROCEDURE:  Small bowel Givens capsule endoscopy.   PHYSICIAN REQUESTING PROCEDURE:  Jonathon Bellows, M.D.   INDICATIONS FOR PROCEDURE:  The patient is a 55 year old lady with profound  iron deficiency anemia with recent hospitalization for chest pain. No family  history of colorectal cancer. GI bleed has not been confirmed. She recently  underwent colonoscopy and EGD. Colonoscopy revealed minimal external  hemorrhoids and direct pancolonic diverticula, solitary erosion at the  ileocecal valve of uncertain significance. The terminal ileum cannot be  intubated. EGD revealed a small hiatal hernia and multiple antral erosions  of uncertain significance. H. pylori serologies were negative. It was felt  that her iron deficiency anemia was not explained by these findings. She is  therefore having her small bowel examined.   FINDINGS:  Gastric passage time was 21 minutes with first duodenal image  being at 23 minutes and 20 seconds. At 24 minutes, there was small bowel  appearance of lymphangiectasia. At 2 hours and 22 minutes, there was a small  area of inflamed appearing small bowel versus small erosion not clearly  defined. No evidence of active GI bleeding on the study. Small bowel transit  time was 2 hours and 27 minutes with first ileocecal valve image at 2 hours  50 minutes and 36 seconds.   SUMMARY AND RECOMMENDATIONS:  Proximal small bowel lymphangiectasia and  small erosion-like lesion seen at her distal small bowel but no active  bleeding noted throughout the study. Otherwise normal appearing small bowel  mucosa. These  findings unlikely explain her iron deficiency anemia. The  study was reviewed by Dr. Jena Gauss as  well. Without having any documented GI bleeding may consider a hematological  workup of her anemia. Will also go ahead and order celiac disease antibody  panel to exclude iron deficiency anemia secondary to celiac disease. Will go  ahead and check 3 more Hemoccults as well.      Tana Coast, P.AJonathon Bellows, M.D.  Electronically Signed    LL/MEDQ  D:  08/06/2005  T:  08/06/2005  Job:  [id number]   cc:   Donna Bernard, M.D.  Fax: [phone]

## 2010-05-31 NOTE — Discharge Summary (Signed)
[place]. [facility]  Patient:    [name], [name] Visit Number: [id number] MRN: [id number]          Service Type: [facility] Location: 4100 [id number] Attending Physician:  Herold Harms Dictated by:   Junie Bame, P.A. Admit Date:  01/21/2001 Discharge Date: 02/01/2001   CC:         John L. Dorothyann Gibbs, M.D.  Donna Bernard, M.D., [phone]   Discharge Summary  DISCHARGE DIAGNOSES: 1. Status post left total knee replacement secondary to torn medial and    lateral meniscus. 2. History of hypertension. 3. History of diabetes mellitus. 4. Anemia. 5. Hypokalemia. 6. Asthma.  HISTORY OF PRESENT ILLNESS:  The patient is a 55 year old black female admitted on January 18, 2001, with knee pain since June 2002, and history of arthroscopy in in August 2002.  The patient had a recent fall and scope showed to have a torn medial and lateral meniscus. The patient underwent a left total knee replacement on January 18, 2001, by John L. Dorothyann Gibbs, M.D.  The patient was placed on Arixtra for DVT prophylaxis and weightbearing as tolerated. Postoperative complications include anemia.  PT report at this time indicated the patient was ambulating a minimum of 15 feet with walker with minimum assist and transfer minimum to moderate assist.  Her hospital course was significant for postoperative anemia.  The patient received two units of packed red blood cells. The patient was transferred to inpatient rehab department.  PAST MEDICAL HISTORY:  This was significant for hypertension, depression, diabetes, asthma, anxiety attack and obesity.  PAST SURGICAL HISTORY:  This was significant for cardiac catheterization in 1993, left knee arthroscopy in 1997.  PRIMARY CARE PHYSICIAN:  Donna Bernard, M.D., [place], [place], [phone].  ALLERGIES:  PROCARDIA.  FAMILY HISTORY:  Noncontributory.  SOCIAL HISTORY:  The patient [place]  She was independent prior to admission. She has not worked since August 2002, secondary to left knee pain.  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 She lives in one level home with six steps to entry.  HOSPITAL COURSE:  The patient was admitted to [facility] on January 21, 2001, for comprehensive inpatient rehabilitation where she received more than three hours of PT and OT daily.  The patient remained on Arixtra 2.5 mg for DVT prophylaxis until January 29, 2001.  She had bilateral venous Dopplers performed on January 25, 2001, which demonstrated no evidence of DVT, superficial thrombus or Baker cyst bilaterally.   Her hospital course was significant for elevated CBGs, hypokalemia, anemia, and mild fever.  On January 21, 2001, she was started on K-Dur 40 mg b.i.d. for several days for hypokalemia.  After addition of potassium, repeat BMET still indicated the patients potassium was below normal limits.  Therefore, potassium was further increased and potassium level was stabilized on January 27, 2001, she had potassium level of 3.6.  The patient was on Glucophage 1000 mg p.o. b.i.d. for diabetes mellitus.  During her rehab stay, CBGs remained greater than 200, therefore Glucotrol was started on January 25, 2001, at 5 mg p.o. q.d. After addition of these medications, CBGs demonstrated slight improvement. She remained on Trinsicon p.o. b.i.d. the entire stay for anemia.  Hemoglobin did not reach above 9, possibly due to patient on menses at time she was in rehab, although she did not require transfusion.  Latest hemoglobin was 8.6, hematocrit 26.8.  Blood pressure remained  fairly stable on Cardura, HCTZ, and Prinivil and Toprol. She had no asthma exacerbation while in rehab.  She was on Ventolin and Flovent daily.  At time of admission, the patient had a slight fever.  Urine cultures came back negative and fever did resolve around day #4 of rehab.   The patient was encouraged to push incentive spirometry.  There were no other major complications beside occasional constipation that occurred in rehab.  She received laxative of choice as needed.  Latest labs indicated that her white blood cell count was 9.8, hemoglobin 8.6, hematocrit 26.8, platelet count 426.  Latest potassium was 4.2, sodium 139, glucose 147, BUN 11, creatinine 0.9. AST 20, ALT 18, alkaline phosphatase 82. She had hemoglobin A1C performed on January 25, 2001, was 7.8.  Urine cultures performed on January 23, 2001, demonstrated 15,000 colonies without any urine pathogens isolated.  At time of discharge, staples were removed and Steri-Strips were applied.  Surgical incision demonstrated no signs of infection, had about 1+ edema. Vitals were stable.  CBGs were running from 167 to 106, 256, 156.  PT report indicated the patient was ambulating approximately 140 feet with walker, modified independently, could transfer sit to stand modified independently, could transfer sit to stand modified independently.  She could perform most ADLs modified independently.  CPM machine indicated the patient had approximately 65 to 55 degrees flexion in her left knee.  The patient was discharged home with her family.  DISCHARGE MEDICATIONS:  1. Trinsicon one tablet twice daily for 30 more days.  2. Norvasc 5 mg twice daily.  3. Cardura 8 mg daily.  4. Lisinopril 20 mg daily.  5. Hydrochlorothiazide 25 mg daily.  6. Paxil 30 mg daily.  7. Glucophage 100 mg twice daily.  8. Glucotrol XL 5 mg daily.  9. Potassium 20 mEq daily. 10. Toprol XL 50 mg daily. 11. OxyContin CR follow taper instructions. 12. Oxycodone 5 mg one to two tablets every four to six hours as needed for     pain. 13. Albuterol and Flovent, resume home doses.  ACTIVITY:  She is to use her walker, no driving, no drinking.  DISCHARGE INSTRUCTIONS:  She will have a home CPM machine two use.   DIET:  No concentrated  sweets.  Low carbohydrates.  Chest CBGs at least twice daily and write down the results and times.  FOLLOW-UP:  She will have [facility] for PT and OT.  She will follow up with Dr. Priscille Kluver within two weeks. Follow-up with Reuel Boom L. Thomasena Edis, M.D., as needed.  Follow-up with Dr. Simone Curia within two to four weeks and monitor glucose and potassium. Dictated by:   Junie Bame, P.A. Attending Physician:  Herold Harms DD:  02/14/01 TD:  02/15/01 Job: [id number]

## 2010-05-31 NOTE — Procedures (Signed)
[name], [name]              ACCOUNT NO.:  [id number]   MEDICAL RECORD NO.:  [id number]          PATIENT TYPE:  INP   LOCATION:  A214                          FACILITY:  [facility]   PHYSICIAN:  Donna Bernard, M.D.DATE OF BIRTH:  [birth year]   DATE OF PROCEDURE:  05/26/2005  DATE OF DISCHARGE:  05/28/2005                                EKG INTERPRETATION   EKG reveals normal sinus rhythm with no significant ST-T changes.      Donna Bernard, M.D.  Electronically Signed     WSL/MEDQ  D:  06/11/2005  T:  06/11/2005  Job:  [id number]

## 2010-05-31 NOTE — Discharge Summary (Signed)
[place]. [facility]  Patient:    [name], [name] Visit Number: [id number] MRN: [id number]          Service Type: [facility] Location: [id number] Attending Physician:  Herold Harms Dictated by:   Jamelle Rushing, P.A. Admit Date:  01/21/2001 Discharge Date: 02/01/2001                             Discharge Summary  ADMISSION DIAGNOSES: 1. End-stage osteoarthritis, left knee. 2. Obesity. 3. Type 2 diabetes mellitus. 4. Hypertension. 5. Asthma. 6. Anxiety attacks.  DISCHARGE DIAGNOSES: 1. Left total knee arthroplasty. 2. Postoperative blood loss anemia. 3. Diabetes type 2. 4. Hypertension. 5. Asthma. 6. Obesity. 7. Anxiety. 8. History of percutaneous transluminal groin area angioplasty.  HISTORY OF PRESENT ILLNESS:  The patient is a 55 year old black female with a history of left knee arthroscopy in 1997 and 2002.  The patient had a significantly torn medial and lateral meniscus after a fall and presents with progressively worsening left knee pain.  The pain is described as a constant aching sensation and burning sensation over the anteromedial and posterior of the knee without any radiation.  The pain increases with walking and improves with rest.  She does have popping, clicking, catching, locking, and slight swelling of the knee.  She does have night pain.  Cortisone injections do not give any relief.  DRUG ALLERGIES:  No known drug allergies.  CURRENT MEDICATIONS:  1. Metformin 500 mg two tablets p.o. b.i.d.  2. Cardura 8 mg p.o. q.d.  3. Lisinopril 20 mg p.o. q.d.  4. Toprol XL 50 mg p.o. q.d.  5. Norvasc 5 mg p.o. b.i.d.  6. Hydrochlorothiazide 25 mg p.o. q.d.  7. Paxil 30 mg p.o. q.d.  8. Flovent 220 mcg two puffs b.i.d.  9. Albuterol 17 g two puffs p.r.n. 10. Bextra 10 mg p.o. q.d.  SURGICAL PROCEDURE:  On January 18, 2001, the patient was taken to the OR by Jonny Ruiz L. Dorothyann Gibbs, M.D., assisted by Jamelle Rushing, P.A.-C.  Under  general anesthesia, the patient underwent a left LCS total knee replacement, fully cemented.  The patient tolerated the procedure well.  The patient was transferred to the recovery room in good condition.  HOSPITAL COURSE:  On January 18, 2001, the patient was admitted to [facility] under the care of John L. Rendall III, M.D., and taken to the OR where a left total knee arthroplasty was performed.  The patient tolerated this procedure well.  No complications.  The estimated blood loss was 100 cc.  The patient was transferred to the recovery room and then to the orthopedics floor in good condition.  The patient then incurred a total of three days postoperative care on the orthopedics floor before being transferred to the rehabilitation unit for further recovery.  The patient initially did develop some postoperative blood loss anemia with her H&H dropping to 7.1 and 27.0.  She received two units of packed red blood cells without any complications and her H&H did improve.  The patients vital signs otherwise remained stable.  Her leg remained neuromotor and neurovascular intact.  Her wound was benign for any signs of infection.  After three days of physical therapy on the orthopedic floor, it was felt that the patient would benefit best from a short stay on the rehabilitation unit prior to being discharged home.  The patient was evaluated and accepted  on the rehabilitation floor on postoperative day #3.  LABORATORY DATA:  The chest x-ray was no active disease.  The CBC on January 20, 2001, showed WBC 12.3, hemoglobin 7.7, hematocrit 29.9, and platelets 285.  The patient was then transfused two units of packed red blood cells.  Routine chemistries on January 20, 2001, showed sodium 138, potassium 3.1, glucose 187 (down from 208 the day previous), BUN 5, and creatinine 0.9.  The urinalysis on admission was normal.  Blood cultures drawn on January 19, 2001, were negative for  growth after five days.  MEDICATIONS DISPENSED ON ORTHOPEDICS FLOOR:  1. Reglan 10 mg p.o. q.6h. p.r.n.  2. Percocet one or two tablets every four to six hours p.r.n.  3. Colace 100 mg p.o. b.i.d.  4. Senokot 8.6 mg p.o. b.i.d. a.c.  5. Tylenol 650 mg p.o. q.4h. p.r.n.  6. Robaxin 500 mg p.o. q.6h. p.r.n.  7. Restoril 30 mg p.o. q.h.s.  8. Arixtra 2.5 mg subcu q.24h. for a total of seven days.  9. Glucophage 1000 mg p.o. b.i.d. 10. Cardura 8 mg p.o. q.d. 11. Lisinopril 20 mg p.o. q.d. 12. Toprol 50 mg p.o. q.d. 13. Norvasc 5 mg p.o. b.i.d. 14. Hydrochlorothiazide 25 mg p.o. q.d. 15. Paxil 30 mg p.o. q.d. 16. Flovent one puff b.i.d. 17. Albuterol two puffs p.r.n. 18. OxyContin CR 10 mg p.o. q.12h.  DISCHARGE INSTRUCTIONS ON TRANSFER TO REHABILITATION FLOOR: 1. Medications:  The patient is to continue the medications dispensed on the    orthopedics floor. 2. Activity:  The patient may be weightbearing as tolerated with the use of a    walker. 3. Diet:  No restrictions. 4. Wound Care:  The patient is to have the wound checked daily for any signs    of infection.  The patient is to have the staples removed on postoperative    day #14. 5. Follow-up:  The patient is to have follow-up with John L. Rendall III,    M.D., in his office two weeks from the date of discharge.  CONDITION ON DISCHARGE FROM ORTHOPEDICS FLOOR TO REHABILITATION FLOOR:  Listed as good. Dictated by:   Jamelle Rushing, P.A. Attending Physician:  Herold Harms DD:  02/11/01 TD:  02/12/01 Job: [id number]

## 2010-05-31 NOTE — Consult Note (Signed)
NAMEJEMIA, FATA              ACCOUNT NO.:  [id number]   MEDICAL RECORD NO.:  [id number]           PATIENT TYPE:  AMB   LOCATION:                                FACILITY:  [facility]   PHYSICIAN:  R. Roetta Sessions, M.D. DATE OF BIRTH:  27-Jul-1955   DATE OF CONSULTATION:  DATE OF DISCHARGE:                                   CONSULTATION   HISTORY OF PRESENT ILLNESS:  [name] is a 55 year old lady who presents for  further evaluation of iron-deficiency anemia. She recently was hospitalized  with chest pain and ruled out for myocardial infarction.  She had a  Cardiolite stress-test which was negative for ischemia. She was found to  have significant microcytic anemia. She has had a long history of iron-  deficiency anemia in the past, always felt to be due to heavy menses  although now the patient has not menstruated for over 9 months and was very  sporadic for approximately a year before that. She was noted to have a  hemoglobin of 8.6, hematocrit 28.2, MCV 61.1. Her iron was 15, iron  saturation 5%, TIBC 316, B12 548, folate 17.8, ferritin was 10.  She has  never had a colonoscopy. She denies any constipation or diarrhea. She  generally has 1 to 3 stools a day. Denies any abdominal pain, nausea,  vomiting, dysphagia or odynophagia, melena or rectal bleeding. She has never  had an EGD or colonoscopy.  She has not had any Hemoccult per her report.  She continues to have intermittent chest pressure and tightness. She  continues to have fatigue.  Denies any dyspnea on exertion.  Sometimes she  wakes up with the chest pressure and wonders if it is reflux.   CURRENT MEDICATIONS:  1.  Catapres TTS two patches q.week.  2.  Atenolol 50 mg b.i.d.  3.  Metformin 1000 mg b.i.d.  4.  Hydrochlorothiazide 12.5 mg q.a.m.  5.  Benazepril 40 mg q.a.m.  6.  Iron 3 tablets daily.   ALLERGIES:  SHE HAS A SENSITIVITY TO PROCARDIA.   PAST MEDICAL HISTORY:  1.  Iron-deficiency anemia.  2.  Diabetes  mellitus.  3.  Hypertension.  4.  Asthma.  5.  Arthritis.  6.  Anxiety.  7.  Status post tubal ligation.  8.  Left knee replacement in 2003.  9.  Negative cardiac catheterization in 1995.   FAMILY HISTORY:  Mother died of intestinal cancer, specifics are unknown.  She says her father died of alcoholic liver disease.   SOCIAL HISTORY:  She is divorced and has two children. She is employed with  [employer]. She quit smoking 14 years ago, had smoked 2 packs daily.  No alcohol use.   REVIEW OF SYSTEMS:  See HPI for GI.  See HPI for cardiopulmonary.  Constitutional - no weight loss.   PHYSICAL EXAMINATION:  VITAL SIGNS:  Weight 274, height 5 feet, 5 inches.  Temperature 98.8, blood pressure 138/80, pulse 84.  GENERAL:  Pleasant, morbidly obese black female in no acute distress.  SKIN:  Warm and dry, no jaundice.  HEENT:  Conjunctivae are pale, sclerae non icteric. Oropharyngeal mucosa  moist and pink, no lesions, erythema or exudate.  NECK:  No lymphadenopathy or thyromegaly.  CHEST:  Lungs are clear to auscultation.  CARDIAC EXAM:  Reveals regular rate and rhythm, faint systolic ejection  murmur. No rubs or gallops.  ABDOMEN:  Obese but symmetrical, positive bowel sounds, soft, nontender. No  organomegaly or masses appreciated. No abdominal bruits or hernias.  EXTREMITIES:  No edema.   IMPRESSION:  [name] is a 55 year old lady with iron-deficiency anemia.  Recent hospitalization for chest pain with the discovery of  significant/profound iron-deficiency anemia. She has never had a  colonoscopy, therefore we need to rule out the possibility of chronic occult  GI bleed.  She has really avoided any GI symptoms.   PLAN:  1.  Colonoscopy with possible esophagogastroduodenoscopy in the near future.  2.  She reports the need for SBE prophylaxis and does have mild mitral      regurgitation on recent echocardiogram. Will cover her with gentamicin      and ampicillin.  Will  have her hold her iron for 7 days then aspirin for      3 days prior to procedure.  3.  Further recommendations to follow.     Dictated by Tana Coast, P.A.      Tana Coast, P.AJonathon Bellows, M.D.  Electronically Signed    LL/MEDQ  D:  07/04/2005  T:  07/04/2005  Job:  [id number]

## 2010-05-31 NOTE — H&P (Signed)
NAMEALCIE, [name]              ACCOUNT NO.:  [id number]   MEDICAL RECORD NO.:  [id number]          PATIENT TYPE:  INP   LOCATION:  A214                          FACILITY:  [facility]   PHYSICIAN:  Donna Bernard, M.D.DATE OF BIRTH:  [birth year]   DATE OF ADMISSION:  05/26/2005  DATE OF DISCHARGE:  LH                                HISTORY & PHYSICAL   CHIEF COMPLAINT:  Chest pain.   SUBJECTIVE:  This patient is a 55 year old female with history of type 2  diabetes, hypertension, asthma, and arthritis, who arrived to the office the  day of admission with complaints of chest discomfort.  The patient notes  over the last several weeks she has had substernal discomfort which is  generally a pressure-like sensation, although sometimes sharp an aching.  It  is accompanied by shortness of breath.  The patient had no wheezing with it,  no nausea and no diaphoresis. She notes that this pain after walking just 30  or 40 feet and then it can last for several minutes.  It goes away if she  rests.  The patient claims compliance with her current medications which  included:  1.  Glucophage 500 mg 2 p.o. twice daily.  2.  Benazepril/hydrochlorothiazide 20/12.5 one daily.  3.  Atenolol 50 mg 1 p.o. twice daily.   PRIOR SURGERIES:  1.  Remote tubal ligation.  2.  Left knee replacement in January 2003.   FAMILY HISTORY:  Positive for hypertension, diabetes, coronary artery  disease.   ALLERGIES:  No true allergies.  PROCARDIA sensitivity noted.   SOCIAL HISTORY:  The patient is single.  No tobacco abuse since 1994 and no  alcohol abuse. Two children.   REVIEW OF SYSTEMS:  The patient notes menstrual cycles stopped approximately  8 to 9 months ago. She does have history of anemia which was felt to be iron  deficiency in the past. She has not really been able to tolerate iron very  well.   PHYSICAL EXAMINATION:  VITAL SIGNS:  In the office, blood pressure 148/84.  Noted weight 278 pounds.  GENERAL:  The patient is alert, obese.  HEENT/NECK:  Neck supple.  Fundi normal. TMs normal.  Oropharynx normal.  LUNGS: Clear, no wheezes currently.  HEART: Regular rate and rhythm.  CHEST:  No chest wall pain.  ABDOMEN:  No significant tenderness. No rebound, no guarding, no CVA  tenderness.  EXTREMITIES: No peripheral edema.  Deep tendon reflexes intact.  Pain in the  knee with extension and flexion.   Hemoglobin A1c 9.9%.   EKG normal sinus rhythm with no significant ST-T changes.   CBC: Hemoglobin 8 with low MCV, positive schistocytes.  First cardiac  enzymes negative.   IMPRESSION:  1.  Unstable angina.  The patient had a negative cardiac catheterization 12      years ago.  However, her symptoms are quite impressive, and she has      strong family history of heart disease and multiple risk factors.  I      think this has to be at the top of the list as  far as potential      diagnoses.  2.  Anemia, worsening, microcytic in nature.  Await iron studies.  The      patient has had very heavy menstrual cycles for quite a few years with      concurrent iron-deficiency anemia.  Further workup is warranted.  Await      studies.  3.  Type 2 diabetes. A1c was good last fall but now is suboptimal.  4.  Hypertension, suboptimal.   PLAN:  1.  Admit for cardiac enzymes, cardiology consultation.  2.  Further orders as noted on chart.      Donna Bernard, M.D.  Electronically Signed     WSL/MEDQ  D:  05/26/2005  T:  05/26/2005  Job:  [id number]

## 2010-05-31 NOTE — Group Therapy Note (Signed)
NAMETOMASINA, KEASLING              ACCOUNT NO.:  [id number]   MEDICAL RECORD NO.:  [id number]          PATIENT TYPE:  INP   LOCATION:  A214                          FACILITY:  [facility]   PHYSICIAN:  Scott A. Gerda Diss, MD    DATE OF BIRTH:  [birth year]   DATE OF PROCEDURE:  05/28/2005  DATE OF DISCHARGE:                                   PROGRESS NOTE   HISTORY OF PRESENT ILLNESS:  We are awaiting the stress Cardiolite study.  She denies any chest tightness, pressure, pain during the night.  She has a  history of anemia for which is felt to be iron deficient anemia, although,  further GI workup is planned as an outpatient.  Should also be noted that  the patient had cardiac studies done which were all negative for any type of  insulin problem.  It should also be noted that patient at B-12 level which  was 548 and a folate level 17.8 and a ferritin of 10 and a total iron-  binding capacity of 308, percent saturation 4, iron level 11 and also total  cholesterol 132, triglyceride 62, HDL 41, LDL 79.  The patient's lungs sound  good.  Heart is regular.  There is a good possibility she will be able to be  discharged to home later today.  Await the findings of the test.      Scott A. Gerda Diss, MD  Electronically Signed     SAL/MEDQ  D:  05/28/2005  T:  05/28/2005  Job:  [id number]

## 2010-05-31 NOTE — Procedures (Signed)
[name], [name]              ACCOUNT NO.:  [id number]   MEDICAL RECORD NO.:  [id number]          PATIENT TYPE:  INP   LOCATION:  A214                          FACILITY:  [facility]   PHYSICIAN:  Donna Bernard, M.D.DATE OF BIRTH:  06-Jul-1955   DATE OF PROCEDURE:  05/27/2005  DATE OF DISCHARGE:  05/28/2005                                EKG INTERPRETATION   EKG reveals normal sinus rhythm with poor R-wave progression across the  precordium suggestive of potential for old anterior MI. However, of note  this same finding was not evidenced on the May 14th EKG. In addition, there  are nonspecific ST-T changes.      Donna Bernard, M.D.  Electronically Signed     WSL/MEDQ  D:  06/11/2005  T:  06/11/2005  Job:  [id number]

## 2010-05-31 NOTE — Discharge Summary (Signed)
NAMEMARGEE, [name]              ACCOUNT NO.:  [id number]   MEDICAL RECORD NO.:  [id number]          PATIENT TYPE:  INP   LOCATION:  A214                          FACILITY:  [facility]   PHYSICIAN:  Donna Bernard, M.D.DATE OF BIRTH:  1955/06/17   DATE OF ADMISSION:  05/26/2005  DATE OF DISCHARGE:  05/16/2007LH                                 DISCHARGE SUMMARY   FINAL DIAGNOSES:  1.  Chest pain.  2.  Microcytic anemia secondary to iron deficiency.  3.  History of chronic anemia.  4.  Type 2 diabetes.  5.  Hypertension.   FINAL DISPOSITION:  1.  The patient discharged to home.  2.  Discharge medications include Lotensin 40 mg daily, hydrochlorothiazide      one half tablet daily, atenolol 50 mg b.i.d., Glucophage 500 mg two      b.i.d., iron tablet one t.i.d. Maintain all other chronic medications.      Add Catapres TTS-2 patch.  3.  Follow up in two weeks in the office.   INITIAL HISTORY AND PHYSICAL:  Please see H&P as dictated.  This patent is  55 year old black female with multiple risk factors who presented to the  office with significant exertional chest discomfort which occurred with any  type of exertion. Based on her unstable anginal symptoms she was admitted to  the hospital.  We did serial cardiac enzymes and these did eventually proved  to be negative. The patient was started on Lovenox and cardiology was  consulted. Her bloodwork showed anemia had progressed somewhat. She has had  a hemoglobin in the range of 9 to 10 for quite a few years, which dropped  all the way down to 8. We did a ferritin level which was very low. There  were a few schistocytes present on CBC, but I felt this was consistent with  severe iron-deficiency anemia. Of note the patient has not had a menstrual  cycle for eight months. For many years iron-deficiency anemia has been felt  to be due to her menorrhagia, however this time the patient has had  amenorrhea for nine months. Based on this, plus  her age of 49+ years I  encouraged her to get an ultrasound and get a colonoscopy soon after we  discharge her from the hospital. The patient appeared to be agreeable.  Cardiology went ahead and did a stress test and this was read as normal. The  patient was discharged home with a diagnosis and disposition as noted above.  Of note, the last 36 hours of hospitalization the patient had no significant  discomfort.      Donna Bernard, M.D.  Electronically Signed     WSL/MEDQ  D:  06/11/2005  T:  06/11/2005  Job:  [id number]

## 2010-05-31 NOTE — Procedures (Signed)
NAMELOTTA, FRANKENFIELD              ACCOUNT NO.:  [id number]   MEDICAL RECORD NO.:  [id number]          PATIENT TYPE:  INP   LOCATION:  A214                          FACILITY:  [facility]   PHYSICIAN:  Vida Roller, M.D.   DATE OF BIRTH:  1955/01/25   DATE OF PROCEDURE:  05/27/2005  DATE OF DISCHARGE:                                  ECHOCARDIOGRAM   PROCEDURE:  Echocardiogram.   PRIMARY CARE PHYSICIAN:  Donna Bernard, M.D.   Tape #[id number], tape count [id number].   This is a 55 year old woman with chest pain. Technical quality of this study  is difficult and the tracing of the aorta is 28 mm.   Left atrium is 48 mm.   Septum is 18 mm.   Posterior wall 15 mm.   Left ventricular diastolic dimension 15 mm.   Left ventricular systolic dimension 32 mm.   Two-D Doppler imaging, the left ventricle is normal size, there is preserved  LV systolic function with an ejection fraction of 65-70%. There is moderate  left ventricular hypertrophy which is concentric. There are no obvious wall  motion abnormalities.   The right ventricle is normal size with normal systolic function.   The left atrium is enlarged, right atrium is difficult to see but probably  is mildly enlarged as well. There is mobility to the atrial septum without  obvious atrial septal aneurysm.   The aortic valve is sclerotic but no stenosis or regurgitation.   The mitral valve has minimal annular calcification with mild regurgitation,  no stenosis is seen.   Tricuspid valve with trivial regurgitation.   There is no pericardial effusion.      Vida Roller, M.D.  Electronically Signed     JH/MEDQ  D:  05/27/2005  T:  05/27/2005  Job:  [id number]   cc:   Donna Bernard, M.D.  Fax: [phone]

## 2010-05-31 NOTE — Discharge Summary (Signed)
[name], [name]              ACCOUNT NO.:  [id number]   MEDICAL RECORD NO.:  [id number]          PATIENT TYPE:  INP   LOCATION:  A320                          FACILITY:  [facility]   PHYSICIAN:  Donna Bernard, M.D.DATE OF BIRTH:  26-Jan-1955   DATE OF ADMISSION:  03/07/2008  DATE OF DISCHARGE:  03/02/2010LH                               DISCHARGE SUMMARY   FINAL DIAGNOSES:  1. Small bowel obstruction.  2. Hypertension, very poor control.  3. Type 2 diabetes.  4. Asthma.   FINAL DISPOSITION:  The patient discharged to home.   DISCHARGE MEDICATIONS:  1. Hydralazine 50 mg one p.o. t.i.d.  2. Labetalol 400 mg one p.o. b.i.d.  3. Clonidine 0.2 mg 3 times a day.  4. Methimazole 20 mg one b.i.d.  5. Norvasc 10 mg daily.  6. K-Tab 20 mEq daily.  7. Amox 500 t.i.d. x7 days.  8. Stop the atenolol.  9. Maintain benazepril 40 q.a.m.  10.Maintain hydrochlorothiazide 25 q.a.m.  11.Maintain metformin 500 two b.i.d.   FINAL DISPOSITION:  1. Discharged to home.  2. Follow up in the office in 1 week.  3. Low-salt diet, no concentrated sweets, low-cholesterol diet.   INITIAL HISTORY AND PHYSICAL:  Please see H&P as dictated.   HOSPITAL COURSE:  This patient is a 55 year old female with history of  many chronic multiple health problems as noted above who presented to  the hospital on day of admission with mid abdominal pain.  This was  accompanied by some nausea.  The pain was severe.  A workup revealed a  scan that was consistent with partial small bowel obstruction.  The  patient was admitted into the hospital.  She was placed on n.p.o.  She  was given DVT prophylaxis.  She was also initially started on Flagyl and  Cipro.  The surgeons were consulted and assessed her.  They recommended  conservative therapy over the next several days.  Her symptoms  stabilized from GI standpoint.  However, the patient's blood pressure  continued to remain tremendously high despite her usual  medications.  Based  on this, cardiologist was consulted.  They made a number of  pharmacologic recommendations and these were instituted.  Finally, on  day of discharge, the patient's numbers were improved and she was  considered stable for discharge.  The patient was sent home with  diagnosis and disposition as noted above.      Donna Bernard, M.D.  Electronically Signed     WSL/MEDQ  D:  04/21/2008  T:  04/22/2008  Job:  [id number]

## 2010-05-31 NOTE — Op Note (Signed)
NAMELETRICE, POLLOK              ACCOUNT NO.:  [id number]   MEDICAL RECORD NO.:  [id number]          PATIENT TYPE:  AMB   LOCATION:  DAY                           FACILITY:  [facility]   PHYSICIAN:  R. Roetta Sessions, M.D. DATE OF BIRTH:  08-26-1955   DATE OF PROCEDURE:  07/23/2005  DATE OF DISCHARGE:                                 OPERATIVE REPORT   PROCEDURE PERFORMED:  Esophagogastroduodenoscopy followed by colonoscopy.   INDICATIONS FOR PROCEDURE:  The patient is a  55 year old lady with profound  iron deficiency anemia.  Recently hospitalized for chest pain, found to have  significant iron deficiency anemia.  No family history of colorectal cancer.  GI bleeding has not been confirmed.  Colonoscopy is now being done with  possible EGD to follow depending on findings at colonoscopy.  This approach  has been discussed with the patient at length.  Potential risks, benefits  and alternatives have been reviewed, questions have been answered, he is  agreeable.  Please see documentation in the medical records.   PROCEDURE NOTE:  Oxygen saturations, blood pressure, pulse and respirations  were monitored throughout the entire procedure.  Conscious sedation with  Versed 6 mg IV, Demerol 125 mg IV in divided doses.  SBE prophylaxis  ampicillin with 2 g IV, gentamicin 120 mg IV.   INSTRUMENT USED:  Olympus video chip system.   FINDINGS:  Digital exam revealed no abnormalities.   ENDOSCOPIC FINDINGS:  Prep was adequate.   Rectum:  Examination of the rectal mucosa including retroflex view of the  anal verge revealed only minimal internal hemorrhoids.  Colon:  Colonic mucosa was surveyed from the rectosigmoid junction to the  left, transverse and right colon to the area of the appendiceal orifice and  ileocecal valve and cecum.  These structures were well seen and photographed  for the record.  From this level, the scope was slowly withdrawn.  All  previously mentioned mucosal surfaces were  again seen.  The patient had  pancolonic diverticula.  There was a 5 mm erosion on the distal side of the  ileocecal valve.  I attempted to intubate the terminal ileum and was  unsuccessful.  The ileocecal valve  opening was somewhat friable.  The  remainder of the colonic mucosa, however, appeared entirely normal.  The  patient tolerated the procedure well, was prepared for  esophagogastroduodenoscopy.   FINDINGS:  Examination of the tubular esophagus revealed no mucosal  abnormalities.  Esophagogastric junction was easily traversed.   Stomach:  The gastric cavity was empty and insufflated well with air.  Thorough examination of the gastric mucosa including retroflex view of the  proximal stomach, esophagogastric junction demonstrated only a small hiatal  hernia and multiple antral erosions.  There was no frank ulcer or  infiltrating process.  Please see photos.  Pylorus was patent and easily  traversed.  Examination of the bulb, and second portion revealed no  abnormalities.   THERAPY/DIAGNOSTIC MANEUVERS PERFORMED:  None.   The patient tolerated the procedures well and was reacted in endoscopy.   IMPRESSION:  Colonoscopy:  Minimal external hemorrhoids, otherwise normal.  Rectal pancolonic diverticula, solitary erosion at the ileocecal valve of  uncertain significance.  Unable to intubate terminal ileum.  Remainder of  colonic mucosa appeared normal.  Esophagogastroduodenoscopy:  1.  Normal esophagus.  2.  Small hiatal hernia, multiple antral erosions of uncertain significance,      otherwise normal gastric mucosa.  Patent pylorus, normal D1 and D2.   These findings would not explain her relatively severe iron deficiency  anemia although she does have some erosions in her stomach which could be  tell-tale signs of other areas of inflammation that remain occult in her  small bowel.  The erosion in her ileocecal valve was interesting.   RECOMMENDATIONS:  Helicobacter pylori  serology will be checked today.  Will  proceed with a Given's capsule study of her small bowel.  Further  recommendations to follow.  Diverticulosis literature given to Ms. Souffrant.      Jonathon Bellows, M.D.  Electronically Signed     RMR/MEDQ  D:  07/23/2005  T:  07/23/2005  Job:  [id number]   cc:   Donna Bernard, M.D.  Fax: [phone]

## 2010-10-16 ENCOUNTER — Encounter: Payer: Self-pay | Admitting: Cardiology

## 2010-10-16 ENCOUNTER — Ambulatory Visit (INDEPENDENT_AMBULATORY_CARE_PROVIDER_SITE_OTHER): Payer: MEDICARE | Admitting: Cardiology

## 2010-10-16 DIAGNOSIS — I10 Essential (primary) hypertension: Secondary | ICD-10-CM

## 2010-10-16 DIAGNOSIS — Z87891 Personal history of nicotine dependence: Secondary | ICD-10-CM

## 2010-10-16 DIAGNOSIS — D643 Other sideroblastic anemias: Secondary | ICD-10-CM

## 2010-10-16 DIAGNOSIS — E119 Type 2 diabetes mellitus without complications: Secondary | ICD-10-CM

## 2010-10-16 DIAGNOSIS — R0989 Other specified symptoms and signs involving the circulatory and respiratory systems: Secondary | ICD-10-CM

## 2010-10-16 DIAGNOSIS — R0683 Snoring: Secondary | ICD-10-CM

## 2010-10-16 DIAGNOSIS — F17201 Nicotine dependence, unspecified, in remission: Secondary | ICD-10-CM | POA: Insufficient documentation

## 2010-10-16 DIAGNOSIS — E785 Hyperlipidemia, unspecified: Secondary | ICD-10-CM

## 2010-10-16 DIAGNOSIS — E0590 Thyrotoxicosis, unspecified without thyrotoxic crisis or storm: Secondary | ICD-10-CM

## 2010-10-16 DIAGNOSIS — E1169 Type 2 diabetes mellitus with other specified complication: Secondary | ICD-10-CM | POA: Insufficient documentation

## 2010-10-16 DIAGNOSIS — R0609 Other forms of dyspnea: Secondary | ICD-10-CM

## 2010-10-16 MED ORDER — PRAVASTATIN SODIUM 40 MG PO TABS: 40.0000 mg | ORAL_TABLET | Freq: Every evening | ORAL | Status: DC

## 2010-10-16 MED ORDER — CLONIDINE HCL 0.2 MG/24HR TD PTWK: 1.0000 | MEDICATED_PATCH | TRANSDERMAL | Status: DC

## 2010-10-16 NOTE — Assessment & Plan Note (Addendum)
Clonidine may be culprit for sleepiness after dosing in the am and pm. Will change to clonidine TTS wkly patch to avoid this. She will have echo to evaluate heart murmur and LV fx. Sleep study is also ordered to evaluate for sleep apnea with ongoing hypertension on multiple medications. She will follow-up with Dr. Dietrich Pates. He has seen and evaluated her today as well.

## 2010-10-16 NOTE — Assessment & Plan Note (Signed)
With diabetes she would benefit from statin.  She will be placed on pravachol 40 mg daily. Follow-up labs in 3 months.

## 2010-10-16 NOTE — Progress Notes (Signed)
HPI: Mrs. Baccari returns to the office today after 2 year hiatus secondary to financial issues.  She has since received disability insurance and wants to be reestablished in our clinic. She has a history of difficult to control hypertension, obesity, diabetes, asthma and microcytic anemia. Since being seen last, she has had a thyroidectomy. Once that was completed she became hypotensive and medications were adjusted by Dr. Lubertha South. She states that her only complaint is sleepiness during the day after taking her medications (clonidine and labetolol). She states she is often sleepy during the day, and is very tired at night, falling asleep easily after taking her medications. She denies history of sleep apnea, but daughter who is with her says that she snores loudly.  No Known Allergies  Current Outpatient Prescriptions  Medication Sig Dispense Refill  . albuterol (PROVENTIL HFA;VENTOLIN HFA) 108 (90 BASE) MCG/ACT inhaler Inhale 2 puffs into the lungs every 4 (four) hours as needed.        Marland Kitchen albuterol (PROVENTIL) (2.5 MG/3ML) 0.083% nebulizer solution Take 2.5 mg by nebulization every 4 (four) hours as needed.        Marland Kitchen amLODipine (NORVASC) 10 MG tablet Take 10 mg by mouth daily.      . Artificial Tear Ointment (ARTIFICIAL TEARS) ointment as needed.        . Aspirin-Salicylamide-Caffeine (BC FAST PAIN RELIEF) 650-195-33.3 MG PACK Take by mouth. Take 3-4 packet a day for knee pain.       . benazepril (LOTENSIN) 40 MG tablet Take 40 mg by mouth daily.      . cycloSPORINE (RESTASIS) 0.05 % ophthalmic emulsion Place 1 drop into both eyes every 12 (twelve) hours.        Marland Kitchen glyBURIDE (DIABETA) 5 MG tablet Take 5 mg by mouth daily.      . hydrochlorothiazide (HYDRODIURIL) 25 MG tablet Take 25 mg by mouth daily.      Marland Kitchen labetalol (NORMODYNE) 200 MG tablet Take 200 mg by mouth 2 (two) times daily.      Marland Kitchen levothyroxine (SYNTHROID, LEVOTHROID) 125 MCG tablet Take 0.125 mg by mouth daily.      . metFORMIN  (GLUCOPHAGE) 500 MG tablet Take 500 mg by mouth Twice daily.      . polyethylene glycol (MIRALAX / GLYCOLAX) packet Take 17 g by mouth daily.        . Polyethylene Glycol 400 (BLINK TEARS OP) Apply to eye.        . cloNIDine (CATAPRES - DOSED IN MG/24 HR) 0.2 mg/24hr patch Place 1 patch (0.2 mg total) onto the skin once a week.  4 patch  3  . pravastatin (PRAVACHOL) 40 MG tablet Take 1 tablet (40 mg total) by mouth every evening. New med for pt.  30 tablet  3    Past Medical History  Diagnosis Date  . Hypertension     normal coronary angiography in 1995; echocardiogram in 05/2005-EF of 65-70%, moderate LVH; negative stress nuclear in 2007  . Diabetes mellitus, type 2   . History of small bowel obstruction 2010    Secondary to a helical hernia  . Asthma   . Hyperthyroidism   . Anemia, iron deficiency     No source identified; possibly due to menstrual blood loss  . Osteoarthritis   . Anxiety   . Tobacco abuse, in remission     20 pack years, discontinued 1990  . Thyroid eye disease   . Hyperlipidemia     Past Surgical History  Procedure Date  . Total knee arthroplasty 2003    Left  . Total thyroidectomy     History reviewed. No pertinent family history.  History   Social History  . Marital Status: Divorced    Spouse Name: N/A    Number of Children: N/A  . Years of Education: N/A   Occupational History  . Disability    Social History Main Topics  . Smoking status: Former Smoker -- 1.0 packs/day for 20 years    Types: Cigarettes  . Smokeless tobacco: Never Used  . Alcohol Use: No  . Drug Use: Not on file  . Sexually Active: No   Other Topics Concern  . Not on file   Social History Narrative   Lives in [place]    ZOX:WRUEAV of systems complete and found to be negative unless listed above   PHYSICAL EXAM BP 175/90  Pulse 80  Resp 18  Ht 5\' 4"  (1.626 m)  Wt 271 lb 6.4 oz (123.106 kg)  BMI 46.59 kg/m2  SpO2 87% General: Well developed, well  nourished, in no acute distress Obese Head: Eyes PERRLA, No xanthomas.   Normal cephalic and atramatic Neck is thick with well healed incision Lungs: Clear bilaterally to auscultation and percussion. Heart: HRRR S1 S2, 1/6 systolic murmur. Pulses equal.            No carotid bruit. No JVD.  No abdominal bruits. No femoral bruits. Abdomen: Bowel sounds are positive, abdomen soft and non-tender without masses or                  Hernia's noted. Msk:  Back normal, normal gait. Normal strength and tone for age. Extremities: No clubbing, cyanosis or edema.  DP +1 Neuro: Alert and oriented X 3. Psych:  Good affect, responds appropriately    ASSESSMENT AND PLAN

## 2010-10-16 NOTE — Patient Instructions (Signed)
**Note De-Identified Cheney Ewart Obfuscation** Your physician has recommended you make the following change in your medication: Change Clonidine to Clonidine TTS apply once weekly  Your physician has requested that you regularly monitor and record your blood pressure readings at home. Please use the same machine at the same time of day to check your readings and record them to bring to your follow-up visit.  Your physician recommends that you have a Sleep study performed  Your physician has requested that you have an echocardiogram. Echocardiography is a painless test that uses sound waves to create images of your heart. It provides your doctor with information about the size and shape of your heart and how well your heart's chambers and valves are working. This procedure takes approximately one hour. There are no restrictions for this procedure.  Your physician recommends that you schedule a follow-up appointment in: 1 month

## 2010-10-22 ENCOUNTER — Ambulatory Visit (HOSPITAL_COMMUNITY)
Admission: RE | Admit: 2010-10-22 | Discharge: 2010-10-22 | Disposition: A | Discharge status: Home / Self Care | Payer: Medicare Other | Source: Ambulatory Visit | Attending: Cardiology | Admitting: Cardiology

## 2010-10-22 DIAGNOSIS — E119 Type 2 diabetes mellitus without complications: Secondary | ICD-10-CM | POA: Insufficient documentation

## 2010-10-22 DIAGNOSIS — I10 Essential (primary) hypertension: Secondary | ICD-10-CM

## 2010-10-22 DIAGNOSIS — I517 Cardiomegaly: Secondary | ICD-10-CM

## 2010-10-22 NOTE — Progress Notes (Signed)
*  PRELIMINARY RESULTS* Echocardiogram 2D Echocardiogram has been performed.  [name] 10/22/2010, 11:35 AM

## 2010-10-28 LAB — URINALYSIS, ROUTINE W REFLEX MICROSCOPIC
Glucose, UA: NEGATIVE
Hgb urine dipstick: NEGATIVE
Ketones, ur: NEGATIVE
Protein, ur: 100 — AB

## 2010-10-28 LAB — COMPREHENSIVE METABOLIC PANEL
ALT: 41 — ABNORMAL HIGH
AST: 48 — ABNORMAL HIGH
CO2: 24
Calcium: 10
GFR calc Af Amer: >60
Sodium: 139
Total Protein: 6.8

## 2010-10-28 LAB — CBC
MCHC: 32.6
RBC: 4.59
RDW: 19.5 — ABNORMAL HIGH

## 2010-10-28 LAB — DIFFERENTIAL
Eosinophils Absolute: 0.1
Eosinophils Relative: 2
Lymphs Abs: 2.8
Monocytes Relative: 11

## 2010-10-28 LAB — URINE MICROSCOPIC-ADD ON

## 2010-11-07 ENCOUNTER — Encounter: Payer: Self-pay | Admitting: Cardiology

## 2010-11-14 ENCOUNTER — Encounter: Payer: Self-pay | Admitting: Cardiology

## 2010-11-14 DIAGNOSIS — D509 Iron deficiency anemia, unspecified: Secondary | ICD-10-CM | POA: Insufficient documentation

## 2010-11-15 ENCOUNTER — Ambulatory Visit (INDEPENDENT_AMBULATORY_CARE_PROVIDER_SITE_OTHER): Payer: Medicare Other | Admitting: Cardiology

## 2010-11-15 ENCOUNTER — Encounter: Payer: Self-pay | Admitting: Cardiology

## 2010-11-15 ENCOUNTER — Other Ambulatory Visit: Payer: Self-pay | Admitting: *Deleted

## 2010-11-15 VITALS — BP 153/94 | HR 97 | Resp 18 | Ht 64.0 in | Wt 271.0 lb

## 2010-11-15 DIAGNOSIS — D509 Iron deficiency anemia, unspecified: Secondary | ICD-10-CM

## 2010-11-15 DIAGNOSIS — E785 Hyperlipidemia, unspecified: Secondary | ICD-10-CM

## 2010-11-15 DIAGNOSIS — E119 Type 2 diabetes mellitus without complications: Secondary | ICD-10-CM

## 2010-11-15 DIAGNOSIS — I10 Essential (primary) hypertension: Secondary | ICD-10-CM

## 2010-11-15 DIAGNOSIS — E0590 Thyrotoxicosis, unspecified without thyrotoxic crisis or storm: Secondary | ICD-10-CM

## 2010-11-15 DIAGNOSIS — E669 Obesity, unspecified: Secondary | ICD-10-CM

## 2010-11-15 MED ORDER — LABETALOL HCL 300 MG PO TABS: 300.0000 mg | ORAL_TABLET | Freq: Three times a day (TID) | ORAL | Status: DC

## 2010-11-15 MED ORDER — LABETALOL HCL 300 MG PO TABS: 300.0000 mg | ORAL_TABLET | Freq: Two times a day (BID) | ORAL | Status: DC

## 2010-11-15 NOTE — Assessment & Plan Note (Signed)
Patient reports CBGs generally less than 120.  A hemoglobin A1c level will be obtained.

## 2010-11-15 NOTE — Patient Instructions (Addendum)
Your physician recommends that you schedule a follow-up appointment in:   1 month for blood pressure check 4 months with Dr Dietrich Pates  Your physician has recommended you make the following change in your medication:   Increase Labetalol to 300 mg twice times a day with your next prescription  Continue Home Blood Pressures  Your physician recommends that you return for lab work in:  Today (CBC, CMET, HGBA1C, TSH)  Referral to dietician regarding weight reduction. You will receive a call from the Dietition with appointment.

## 2010-11-15 NOTE — Assessment & Plan Note (Addendum)
Blood pressure control is fairly good.  Dose of amlodipine, benazepril and hydrochlorothiazide are as high as I would like to take them.  Labetalol will be increased to 300 mg twice a day, and patient will return in one month for reassessment by the cardiology nurses.  In the meantime, she will monitor blood pressures at home.  If BP remains elevated, labetalol can be further increased or clonidine TTS 2 changed to TTS 3.  I will see this nice woman again in 4 months.

## 2010-11-15 NOTE — Progress Notes (Signed)
HPI : Ms. Mclelland returns to the office as scheduled for continued assessment and treatment of hypertension.  Since her last visit, she has done extremely well.  She was never contacted for a sleep study, but somnolence has resolved following substitution of transdermal for oral clonidine.  Her energy level has been good with no dyspnea, chest discomfort, lightheadedness or syncope.  She has left knee pain, which has been present continuously for the past 10 years since a TKA performed by Dr. Brynda Greathouse.  She has considered seeking a second opinion at [facility], but this has not yet been done.  Diabetic control has been good when assessed by fingerstick at home.  No recent CBC or hemoglobin A1c level is available.  She returns with a list of approximately 20 blood pressures indicating relatively good control.  Approximately 30% of the systolics exceed 140.  Current Outpatient Prescriptions on File Prior to Visit  Medication Sig Dispense Refill  . albuterol (PROVENTIL HFA;VENTOLIN HFA) 108 (90 BASE) MCG/ACT inhaler Inhale 2 puffs into the lungs every 4 (four) hours as needed.        Marland Kitchen albuterol (PROVENTIL) (2.5 MG/3ML) 0.083% nebulizer solution Take 2.5 mg by nebulization every 4 (four) hours as needed.        Marland Kitchen amLODipine (NORVASC) 10 MG tablet Take 10 mg by mouth daily.      . Artificial Tear Ointment (ARTIFICIAL TEARS) ointment as needed.        . Aspirin-Salicylamide-Caffeine (BC FAST PAIN RELIEF) 650-195-33.3 MG PACK Take by mouth. Take 3-4 packet a day for knee pain.       . benazepril (LOTENSIN) 40 MG tablet Take 40 mg by mouth daily.      . cloNIDine (CATAPRES - DOSED IN MG/24 HR) 0.2 mg/24hr patch Place 1 patch (0.2 mg total) onto the skin once a week.  4 patch  3  . cycloSPORINE (RESTASIS) 0.05 % ophthalmic emulsion Place 1 drop into both eyes every 12 (twelve) hours.        Marland Kitchen glyBURIDE (DIABETA) 5 MG tablet Take 5 mg by mouth daily.      . hydrochlorothiazide (HYDRODIURIL) 25 MG tablet Take 25 mg  by mouth daily.      Marland Kitchen levothyroxine (SYNTHROID, LEVOTHROID) 125 MCG tablet Take 0.125 mg by mouth daily.      . metFORMIN (GLUCOPHAGE) 500 MG tablet Take 500 mg by mouth Twice daily.      . polyethylene glycol (MIRALAX / GLYCOLAX) packet Take 17 g by mouth daily.        . Polyethylene Glycol 400 (BLINK TEARS OP) Apply to eye.        . pravastatin (PRAVACHOL) 40 MG tablet Take 1 tablet (40 mg total) by mouth every evening. New med for pt.  30 tablet  3     Allergies  Allergen Reactions  . Procardia (Nifedipine)       Past medical history, social history, and family history reviewed and updated.  ROS: See history of present illness.  PHYSICAL EXAM: BP 153/94  Pulse 97  Resp 18  Ht 5\' 4"  (1.626 m)  Wt 271 lb (122.925 kg)  BMI 46.52 kg/m2  General-Well developed; no acute distress Body habitus-obese HEENT- prominent eyes, likely exophthalmos Neck-No JVD; no carotid bruits Lungs-clear lung fields; resonant to percussion; thyroidectomy scar Cardiovascular-normal PMI; normal S1 and S2; S4 present Abdomen-normal bowel sounds; soft and non-tender without masses or organomegaly Musculoskeletal-No deformities, no cyanosis or clubbing Neurologic-Normal cranial nerves; symmetric strength and tone Skin-Warm,  no significant lesions Extremities-distal pulses intact; trace edema  ASSESSMENT AND PLAN:

## 2010-11-15 NOTE — Assessment & Plan Note (Addendum)
She agrees to attempt caloric restriction and weight loss on her own as well as increase physical activity.  If this does not prove  successful, professional assistance will be required.

## 2010-11-15 NOTE — Assessment & Plan Note (Signed)
Suboptimal lipid profile, the patient has no known vascular disease.  Current dose of pravastatin can be continued for now.

## 2010-11-15 NOTE — Assessment & Plan Note (Signed)
No recent CBC available; one will be obtained. 

## 2010-11-15 NOTE — Assessment & Plan Note (Signed)
No recent TSH is available.  One will be obtained.

## 2010-11-16 ENCOUNTER — Encounter: Payer: Self-pay | Admitting: Cardiology

## 2010-11-16 LAB — COMPREHENSIVE METABOLIC PANEL
ALT: 12 U/L (ref 0–35)
AST: 21 U/L (ref 0–37)
CO2: 26 mEq/L (ref 19–32)
Chloride: 105 mEq/L (ref 96–112)
Sodium: 144 mEq/L (ref 135–145)
Total Bilirubin: 0.2 mg/dL — ABNORMAL LOW (ref 0.3–1.2)
Total Protein: 7.2 g/dL (ref 6.0–8.3)

## 2010-11-16 LAB — CBC
MCH: 23.7 pg — ABNORMAL LOW (ref 26.0–34.0)
Platelets: 268 10*3/uL (ref 150–400)
RBC: 4.64 MIL/uL (ref 3.87–5.11)
WBC: 7.5 10*3/uL (ref 4.0–10.5)

## 2010-11-16 LAB — HEMOGLOBIN A1C: Hgb A1c MFr Bld: 6.9 % — ABNORMAL HIGH (ref <5.7)

## 2010-12-02 ENCOUNTER — Ambulatory Visit: Admission: RE | Admit: 2010-12-02 | Payer: Self-pay | Source: Ambulatory Visit

## 2010-12-02 ENCOUNTER — Encounter (HOSPITAL_COMMUNITY): Payer: Self-pay | Admitting: Dietician

## 2010-12-02 NOTE — Progress Notes (Signed)
Outpatient Initial Nutrition Assessment  Date:12/02/2010   Time: 10:00 AM  Referring Physician: Dr. Dietrich Pates Reason for Visit: obesity/ weight loss  Nutrition Assessment:  Ht: 64" Wt: 270# IBW: 120# %IBW: 225% UBW:170# %UBW: 159% BMI: 46.35 Goal Weight: 165#  Weight hx: Pt reports UBW of 170#, which prior to thyroid surgery 2 years ago she was able to maintain; her weight has gradually increased. Her highest weight was 315# 1 year ago. Her lowest adult weight was 134#, 35 years ago.   Estimated nutritional needs: 1610-9604 kcals daily, 98-123 grams protein daily, 1.9-2.1 L fluid daily  PMH:  Past Medical History  Diagnosis Date  . Hypertension     Severe; normal coronary angiography in 1995; echocardiogram in 05/2005-EF of 65-70%, moderate LVH; negative stress nuclear in 2007  . Diabetes mellitus, type 2   . Asthma   . Grave's disease     With ophthalmic involvement and hyperthyroidism  . Anemia, iron deficiency     No GI source identified; possibly due to menstrual blood loss  . Osteoarthritis   . Anxiety   . Tobacco abuse, in remission     20 pack years, discontinued 1990  . Hyperlipidemia   . Small bowel obstruction 02/2008    umbilical hernia; diverticulosis  . Obesity     Medications: Current outpatient prescriptions:albuterol (PROVENTIL HFA;VENTOLIN HFA) 108 (90 BASE) MCG/ACT inhaler, Inhale 2 puffs into the lungs every 4 (four) hours as needed.  , Disp: , Rfl: ;  albuterol (PROVENTIL) (2.5 MG/3ML) 0.083% nebulizer solution, Take 2.5 mg by nebulization every 4 (four) hours as needed.  , Disp: , Rfl: ;  amLODipine (NORVASC) 10 MG tablet, Take 10 mg by mouth daily., Disp: , Rfl:  Artificial Tear Ointment (ARTIFICIAL TEARS) ointment, as needed.  , Disp: , Rfl: ;  Aspirin-Salicylamide-Caffeine (BC FAST PAIN RELIEF) 650-195-33.3 MG PACK, Take by mouth. Take 3-4 packet a day for knee pain. , Disp: , Rfl: ;  benazepril (LOTENSIN) 40 MG tablet, Take 40 mg by mouth daily., Disp: ,  Rfl: ;  cloNIDine (CATAPRES - DOSED IN MG/24 HR) 0.2 mg/24hr patch, Place 1 patch (0.2 mg total) onto the skin once a week., Disp: 4 patch, Rfl: 3 cycloSPORINE (RESTASIS) 0.05 % ophthalmic emulsion, Place 1 drop into both eyes every 12 (twelve) hours.  , Disp: , Rfl: ;  glyBURIDE (DIABETA) 5 MG tablet, Take 5 mg by mouth daily., Disp: , Rfl: ;  hydrochlorothiazide (HYDRODIURIL) 25 MG tablet, Take 25 mg by mouth daily., Disp: , Rfl: ;  labetalol (NORMODYNE) 300 MG tablet, Take 1 tablet (300 mg total) by mouth 2 (two) times daily., Disp: 60 tablet, Rfl: 12 levothyroxine (SYNTHROID, LEVOTHROID) 125 MCG tablet, Take 0.125 mg by mouth daily., Disp: , Rfl: ;  metFORMIN (GLUCOPHAGE) 500 MG tablet, Take 500 mg by mouth Twice daily., Disp: , Rfl: ;  polyethylene glycol (MIRALAX / GLYCOLAX) packet, Take 17 g by mouth daily.  , Disp: , Rfl: ;  Polyethylene Glycol 400 (BLINK TEARS OP), Apply to eye.  , Disp: , Rfl:  pravastatin (PRAVACHOL) 40 MG tablet, Take 1 tablet (40 mg total) by mouth every evening. New med for pt., Disp: 30 tablet, Rfl: 3  Labs:  CMP     Component Value Date/Time   NA 144 11/15/2010 1341   K 3.7 11/15/2010 1341   CL 105 11/15/2010 1341   CO2 26 11/15/2010 1341   GLUCOSE 157* 11/15/2010 1341   BUN 12 11/15/2010 1341   CREATININE 0.90 11/15/2010  1341   CREATININE 0.75 11/19/2009 1641   CALCIUM 8.8 11/15/2010 1341   PROT 7.2 11/15/2010 1341   ALBUMIN 4.1 11/15/2010 1341   AST 21 11/15/2010 1341   ALT 12 11/15/2010 1341   ALKPHOS 85 11/15/2010 1341   BILITOT 0.2* 11/15/2010 1341   GFRNONAA >60 11/19/2009 1641   GFRAA  Value: >60        The eGFR has been calculated using the MDRD equation. This calculation has not been validated in all clinical situations. eGFR's persistently <60 mL/min signify possible Chronic Kidney Disease. 11/19/2009 1641     CBC    Component Value Date/Time   WBC 7.5 11/15/2010 1341   RBC 4.64 11/15/2010 1341   HGB 11.0* 11/15/2010 1341   HCT 35.8* 11/15/2010 1341   PLT 268  11/15/2010 1341   MCV 77.2* 11/15/2010 1341   MCH 23.7* 11/15/2010 1341   MCHC 30.7 11/15/2010 1341   RDW 18.7* 11/15/2010 1341   LYMPHSABS 1.8 11/19/2009 1641   MONOABS 0.7 11/19/2009 1641   EOSABS 0.2 11/19/2009 1641   BASOSABS 0.0 11/19/2009 1641     Lipid Panel  No results found for this basename: chol, trig, hdl, cholhdl, vldl, ldlcalc     Lab Results  Component Value Date   HGBA1C 6.9* 11/15/2010   Lab Results  Component Value Date   CREATININE 0.90 11/15/2010     Nutrition hx/habits: Ms. 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 She reports that she has cut back on a lot of foods to help control her diabetes and blood pressure, including sodas, processed meats, fast food, and red meat. She is eating whole grains and frozen vegetables. CBGs range from 85-135, per pt report. BP runs 136-139/62-70, per pt report. She habitually skips breakfast, admitting that she doesn't feel hungry in the morning and doesn't want to prepare a large meal. She does not exercise, due to knee pain. She has gotten a knee replacement in the past and is considering another one. She reports that her knee hurt even when she was not as heavy. She in contemplating going to the [facility] to use the treadmill and water exercises (she is part of the [employer] program). Pt reports a very high stress level, due to finances, health, and family. She lives at home with her 2 adult daughters.   Diet recall: Breakfast: skips; Lunch (11-12 PM): oatmeal OR multigrain cereal with skim milk OR egg beaters, [place] bacon OR muffin; Snack: 1 cup milk; Dinner (5 PM): chicken or ground [place], vegetable; Snack: pudding OR cake OR brownie; Beverages: 1/2 cup orange juice with lunch, also drinks 3 cups of skim milk daily, 2-3 diet sodas daily, and unsweetened tea with artificial sweetener  Nutrition Diagnosis: Involuntary weight gain r/t sedentary  lifestyle, disordered eating pattern AEB 100# (37%) weight gain x 2 years.  Nutrition Intervention Nutrition rx: 1500 heart healthy, diabetic diet; 3 meals/day (45-60 grams carbohydrate per meal); low calorie beverages only; physical activity as tolerated  Education/Counseling Provided: Educated pt on importance of eating 3 meals a day and following a regular meal schedule. Discussed importance of limiting sweets and salty foods. Also emphasized importance of physical activity to assist with weight loss; discussed exercise options. Encouraged slow, moderate weight loss (1-2# per week) and educated pt how small amounts of weight loss (5-10% loss) can help improve overall health.  Understanding, Motivation, Ability to Follow Recommendations: Expect fair compliance.  Pt desires weight loss and understands that it will take a long and serious commitment to reach her goals; she is aware the it will take 1-2 years or more to reach her goals.   Monitoring and Evaluation: Goals: 1) 1-2# weight loss per year; 2) 3 meals/day (eat toast and smoothie OR oatmeal and toast OR muffin for breakfast); 3) Physical activity as tolerated (start at 15 minutes 3 times per week; work up to 30 minutes 5 times per week)  Recommendations: 1) For weight loss: 1610-9604 kcals daily; 2) Try water exercises for physical activity; 3) Break up work outs into smaller, more frequent sessions  F/U: 2 months  Orlene Plum, RD  12/02/2010  Time: 10:00 AM

## 2010-12-08 ENCOUNTER — Encounter: Payer: Self-pay | Admitting: Cardiology

## 2010-12-08 DIAGNOSIS — J45909 Unspecified asthma, uncomplicated: Secondary | ICD-10-CM | POA: Insufficient documentation

## 2010-12-08 DIAGNOSIS — M1990 Unspecified osteoarthritis, unspecified site: Secondary | ICD-10-CM | POA: Insufficient documentation

## 2010-12-24 ENCOUNTER — Ambulatory Visit (INDEPENDENT_AMBULATORY_CARE_PROVIDER_SITE_OTHER): Payer: Medicare Other

## 2010-12-24 VITALS — BP 140/75 | HR 70 | Wt 276.0 lb

## 2010-12-24 DIAGNOSIS — I10 Essential (primary) hypertension: Secondary | ICD-10-CM

## 2011-01-27 ENCOUNTER — Encounter (HOSPITAL_COMMUNITY): Payer: Self-pay | Admitting: Dietician

## 2011-01-27 NOTE — Progress Notes (Signed)
Pt was a no-show for follow-up appointment scheduled for 11/27/11 at 10 AM. Sent letter via [place] [employer] to pt home to notify pt of no-show and request rescheduling appointment.

## 2011-02-04 ENCOUNTER — Other Ambulatory Visit: Payer: Self-pay | Admitting: Cardiology

## 2011-02-14 ENCOUNTER — Other Ambulatory Visit: Payer: Self-pay | Admitting: Cardiology

## 2011-03-13 DIAGNOSIS — E890 Postprocedural hypothyroidism: Secondary | ICD-10-CM | POA: Insufficient documentation

## 2011-03-31 ENCOUNTER — Ambulatory Visit: Payer: Medicare Other | Admitting: Adult Health

## 2011-03-31 ENCOUNTER — Ambulatory Visit: Payer: Medicare Other | Admitting: Cardiology

## 2011-04-16 ENCOUNTER — Ambulatory Visit: Payer: Medicare Other | Admitting: Cardiology

## 2011-05-12 ENCOUNTER — Encounter: Payer: Self-pay | Admitting: Cardiology

## 2011-05-12 ENCOUNTER — Telehealth: Payer: Self-pay | Admitting: *Deleted

## 2011-05-12 ENCOUNTER — Ambulatory Visit (INDEPENDENT_AMBULATORY_CARE_PROVIDER_SITE_OTHER): Payer: Medicare Other | Admitting: Cardiology

## 2011-05-12 VITALS — BP 160/85 | HR 73 | Resp 18 | Ht 64.0 in | Wt 272.0 lb

## 2011-05-12 DIAGNOSIS — J45909 Unspecified asthma, uncomplicated: Secondary | ICD-10-CM

## 2011-05-12 DIAGNOSIS — I10 Essential (primary) hypertension: Secondary | ICD-10-CM

## 2011-05-12 DIAGNOSIS — E785 Hyperlipidemia, unspecified: Secondary | ICD-10-CM

## 2011-05-12 DIAGNOSIS — E119 Type 2 diabetes mellitus without complications: Secondary | ICD-10-CM

## 2011-05-12 DIAGNOSIS — M1990 Unspecified osteoarthritis, unspecified site: Secondary | ICD-10-CM | POA: Insufficient documentation

## 2011-05-12 NOTE — Assessment & Plan Note (Addendum)
Blood pressure is slightly high at this visit, but patient brings a list of 50 additional determinations at home, all of which are perfectly normal.  Current medication will be continued.

## 2011-05-12 NOTE — Patient Instructions (Addendum)
Your physician recommends that you schedule a follow-up appointment in: 1 year  Make a follow up appointment with Dr Berton Lan Your physician recommends that you schedule a follow-up appointment in: Within the week   ADDENDUM:  5/2 PHONE CALL RECEIVED REGARDING INCREASING PRAVASTATIN TO 80 MG DAILY AND OBTAINING LABS IN 1 MONTH.  THIS WAS DOCUMENTED IN PHYSICIAN NOTE, HOWEVER NOT TRANSCRIBED TO INSTRUCTION SHEET GIVEN TO ME, THEREFORE NEW SCRIPT SENT IN TODAY AND WILL FOLLOW UP LIPIDS IN 1 MONTH.

## 2011-05-12 NOTE — Assessment & Plan Note (Signed)
Hyperlipidemia control was suboptimal when assessed a year ago; dose of pravastatin will be increased to 80 mg per day with a repeat lipid profile obtained one month thereafter.

## 2011-05-12 NOTE — Progress Notes (Deleted)
Name: [name]    DOB: November 16, 1955  Age: 56 y.o.  MR#: [id number]       PCP:  Provider Not In System      Insurance: @PAYORNAME @   CC:    Chief Complaint  Patient presents with  . Appointment    no complaints -meds/list    VS BP 160/85  Pulse 73  Resp 18  Ht 5\' 4"  (1.626 m)  Wt 272 lb (123.378 kg)  BMI 46.69 kg/m2  Weights Current Weight  05/12/11 272 lb (123.378 kg)  12/24/10 276 lb 0.6 oz (125.211 kg)  12/02/10 270 lb (122.471 kg)    Blood Pressure  BP Readings from Last 3 Encounters:  05/12/11 160/85  12/24/10 140/75  11/15/10 153/94     Admit date:  (Not on file) Last encounter with RMR:  02/14/2011   Allergy Allergies  Allergen Reactions  . Labetalol     Previously taking; stopped for unclear reasons.  . Procardia (Nifedipine)     Current Outpatient Prescriptions  Medication Sig Dispense Refill  . albuterol (PROVENTIL HFA;VENTOLIN HFA) 108 (90 BASE) MCG/ACT inhaler Inhale 2 puffs into the lungs every 4 (four) hours as needed.        Marland Kitchen albuterol (PROVENTIL) (2.5 MG/3ML) 0.083% nebulizer solution Take 2.5 mg by nebulization every 4 (four) hours as needed.        Marland Kitchen amLODipine (NORVASC) 10 MG tablet Take 10 mg by mouth daily.      . Artificial Tear Ointment (ARTIFICIAL TEARS) ointment as needed.        . Aspirin-Salicylamide-Caffeine (BC FAST PAIN RELIEF) 650-195-33.3 MG PACK Take by mouth. Take 3-4 packet a day for knee pain.       . benazepril (LOTENSIN) 40 MG tablet Take 40 mg by mouth daily.      . cloNIDine (CATAPRES - DOSED IN MG/24 HR) 0.2 mg/24hr patch APPLY 1 PATCH ONTO THE SKIN ONCE WEEKLY  4 patch  6  . cycloSPORINE (RESTASIS) 0.05 % ophthalmic emulsion Place 1 drop into both eyes every 12 (twelve) hours.        Marland Kitchen glyBURIDE (DIABETA) 5 MG tablet Take 5 mg by mouth daily.      . hydrochlorothiazide (HYDRODIURIL) 25 MG tablet Take 25 mg by mouth daily.      Marland Kitchen labetalol (NORMODYNE) 300 MG tablet Take 1 tablet (300 mg total) by mouth 2 (two) times  daily.  60 tablet  12  . levothyroxine (SYNTHROID, LEVOTHROID) 125 MCG tablet Take 0.125 mg by mouth daily.      . metFORMIN (GLUCOPHAGE) 500 MG tablet Take 500 mg by mouth as directed. 2 QAM ANS 1 QHS      . polyethylene glycol (MIRALAX / GLYCOLAX) packet Take 17 g by mouth daily.        . pravastatin (PRAVACHOL) 40 MG tablet TAKE 1 TABLET BY MOUTH EVERY EVENING  30 tablet  2    Discontinued Meds:    Medications Discontinued During This Encounter  Medication Reason  . Polyethylene Glycol 400 (BLINK TEARS OP) Error    Patient Active Problem List  Diagnoses  . Hypertension  . Diabetes mellitus, type 2  . Hyperthyroidism  . Tobacco abuse, in remission  . Hyperlipidemia  . Anemia, iron deficiency  . Obesity  . Asthma  . Degenerative joint disease    LABS No visits with results within 3 Month(s) from this visit. Latest known visit with results is:  Office Visit on 11/15/2010  Component Date Value  .  WBC 11/15/2010 7.5   . RBC 11/15/2010 4.64   . Hemoglobin 11/15/2010 11.0*  . HCT 11/15/2010 35.8*  . MCV 11/15/2010 77.2*  . [facility] 11/15/2010 23.7*  . MCHC 11/15/2010 30.7   . RDW 11/15/2010 18.7*  . Platelets 11/15/2010 268   . Sodium 11/15/2010 144   . Potassium 11/15/2010 3.7   . Chloride 11/15/2010 105   . CO2 11/15/2010 26   . Glucose, Bld 11/15/2010 157*  . BUN 11/15/2010 12   . Creat 11/15/2010 0.90   . Total Bilirubin 11/15/2010 0.2*  . Alkaline Phosphatase 11/15/2010 85   . AST 11/15/2010 21   . ALT 11/15/2010 12   . Total Protein 11/15/2010 7.2   . Albumin 11/15/2010 4.1   . Calcium 11/15/2010 8.8   . Hemoglobin A1C 11/15/2010 6.9*  . Mean Plasma Glucose 11/15/2010 151*  . TSH 11/15/2010 0.775      Results for this Opt Visit:     Results for orders placed in visit on 11/15/10  CBC      Component Value Range   WBC 7.5  4.0 - 10.5 (K/uL)   RBC 4.64  3.87 - 5.11 (MIL/uL)   Hemoglobin 11.0 (*) 12.0 - 15.0 (g/dL)   HCT 19.1 (*) 47.8 - 46.0 (%)   MCV  77.2 (*) 78.0 - 100.0 (fL)   MCH 23.7 (*) 26.0 - 34.0 (pg)   MCHC 30.7  30.0 - 36.0 (g/dL)   RDW 29.5 (*) 62.1 - 15.5 (%)   Platelets 268  150 - 400 (K/uL)  COMPREHENSIVE METABOLIC PANEL      Component Value Range   Sodium 144  135 - 145 (mEq/L)   Potassium 3.7  3.5 - 5.3 (mEq/L)   Chloride 105  96 - 112 (mEq/L)   CO2 26  19 - 32 (mEq/L)   Glucose, Bld 157 (*) 70 - 99 (mg/dL)   BUN 12  6 - 23 (mg/dL)   Creat 3.08  6.57 - 8.46 (mg/dL)   Total Bilirubin 0.2 (*) 0.3 - 1.2 (mg/dL)   Alkaline Phosphatase 85  39 - 117 (U/L)   AST 21  0 - 37 (U/L)   ALT 12  0 - 35 (U/L)   Total Protein 7.2  6.0 - 8.3 (g/dL)   Albumin 4.1  3.5 - 5.2 (g/dL)   Calcium 8.8  8.4 - 96.2 (mg/dL)  HEMOGLOBIN X5M      Component Value Range   Hemoglobin A1C 6.9 (*) <5.7 (%)   Mean Plasma Glucose 151 (*) <117 (mg/dL)  TSH      Component Value Range   TSH 0.775  0.350 - 4.500 (uIU/mL)    EKG Orders placed in visit on 06/11/05  . EKG  . EKG     Prior Assessment and Plan Problem List as of 05/12/2011          Cardiology Problems   Hypertension   Last Assessment & Plan Note   11/15/2010 Office Visit Addendum 11/16/2010 11:16 PM by Kathlen Brunswick, MD    Blood pressure control is fairly good.  Dose of amlodipine, benazepril and hydrochlorothiazide are as high as I would like to take them.  Labetalol will be increased to 300 mg twice a day, and patient will return in one month for reassessment by the cardiology nurses.  In the meantime, she will monitor blood pressures at home.  If BP remains elevated, labetalol can be further increased or clonidine TTS 2 changed to TTS 3.  I will  see this nice woman again in 4 months.    Hyperlipidemia   Last Assessment & Plan Note   11/15/2010 Office Visit Signed 11/15/2010  4:20 PM by Kathlen Brunswick, MD    Suboptimal lipid profile, the patient has no known vascular disease.  Current dose of pravastatin can be continued for now.      Other   Diabetes mellitus, type 2    Last Assessment & Plan Note   11/15/2010 Office Visit Signed 11/15/2010  4:19 PM by Kathlen Brunswick, MD    Patient reports CBGs generally less than 120.  A hemoglobin A1c level will be obtained.    Hyperthyroidism   Last Assessment & Plan Note   11/15/2010 Office Visit Signed 11/15/2010  4:26 PM by Kathlen Brunswick, MD    No recent TSH is available.  One will be obtained.    Tobacco abuse, in remission   Anemia, iron deficiency   Last Assessment & Plan Note   11/15/2010 Office Visit Signed 11/15/2010  4:18 PM by Kathlen Brunswick, MD    No recent CBC available-one will be obtained.    Obesity   Last Assessment & Plan Note   11/15/2010 Office Visit Addendum 11/16/2010 11:18 PM by Kathlen Brunswick, MD    She agrees to attempt caloric restriction and weight loss on her own as well as increase physical activity.  If this does not prove  successful, professional assistance will be required.    Asthma   Degenerative joint disease       Imaging: No results found.   FRS Calculation: Score not calculated. Missing: Total Cholesterol, HDL

## 2011-05-12 NOTE — Progress Notes (Signed)
Patient ID: [name], female   DOB: [birth year], 56 y.o.   MRN: [id number]  HPI: Scheduled return visit for this very nice woman with long-standing diabetes, hypertension and hyperlipidemia but no known vascular disease.  Since her last visit, she has done quite well, having required neither urgent medical care nor hospitalization.  Activity is limited due to pain and immobility in the left knee, which has been present since a left TKA 10 years ago.  Prior to Admission medications   Medication Sig Start Date End Date Taking? Authorizing Provider  albuterol (PROVENTIL HFA;VENTOLIN HFA) 108 (90 BASE) MCG/ACT inhaler Inhale 2 puffs into the lungs every 4 (four) hours as needed.     Yes Historical Provider, MD  albuterol (PROVENTIL) (2.5 MG/3ML) 0.083% nebulizer solution Take 2.5 mg by nebulization every 4 (four) hours as needed.     Yes Historical Provider, MD  amLODipine (NORVASC) 10 MG tablet Take 10 mg by mouth daily. 10/07/10  Yes Historical Provider, MD  Artificial Tear Ointment (ARTIFICIAL TEARS) ointment as needed.     Yes Historical Provider, MD  Aspirin-Salicylamide-Caffeine (BC FAST PAIN RELIEF) 650-195-33.3 MG PACK Take by mouth. Take 3-4 packet a day for knee pain.    Yes Historical Provider, MD  benazepril (LOTENSIN) 40 MG tablet Take 40 mg by mouth daily. 09/16/10  Yes Historical Provider, MD  cloNIDine (CATAPRES - DOSED IN MG/24 HR) 0.2 mg/24hr patch APPLY 1 PATCH ONTO THE SKIN ONCE WEEKLY 02/04/11  Yes Lacey Brunswick, MD  cycloSPORINE (RESTASIS) 0.05 % ophthalmic emulsion Place 1 drop into both eyes every 12 (twelve) hours.     Yes Historical Provider, MD  glyBURIDE (DIABETA) 5 MG tablet Take 5 mg by mouth daily. 09/25/10  Yes Historical Provider, MD  hydrochlorothiazide (HYDRODIURIL) 25 MG tablet Take 25 mg by mouth daily. 10/02/10  Yes Historical Provider, MD  labetalol (NORMODYNE) 300 MG tablet Take 1 tablet (300 mg total) by mouth 2 (two) times daily. 11/15/10 11/15/11 Yes Lacey Brunswick, MD  levothyroxine (SYNTHROID, LEVOTHROID) 125 MCG tablet Take 0.125 mg by mouth daily. 09/16/10  Yes Historical Provider, MD  metFORMIN (GLUCOPHAGE) 500 MG tablet Take 500 mg by mouth as directed. 2 QAM ANS 1 QHS 10/07/10  Yes Historical Provider, MD  polyethylene glycol (MIRALAX / GLYCOLAX) packet Take 17 g by mouth daily.     Yes Historical Provider, MD  pravastatin (PRAVACHOL) 40 MG tablet TAKE 1 TABLET BY MOUTH EVERY EVENING 02/14/11  Yes Lacey Brunswick, MD   Allergies  Allergen Reactions  . Labetalol     Previously taking; stopped for unclear reasons.  . Procardia (Nifedipine)   Past medical history, social history, and family history reviewed and updated.  ROS: Denies orthopnea, PND, palpitations, lightheadedness, chest pain or syncope.  Mild intermittent pedal edema.  All other systems reviewed and are negative.  PHYSICAL EXAM: BP 160/85  Pulse 73  Resp 18  Ht 5\' 4"  (1.626 m)  Wt 123.378 kg (272 lb)  BMI 46.69 kg/m2  General-Well developed; no acute distress Body habitus-proportionate weight and height Neck-No JVD; no carotid bruits Lungs-clear lung fields with decreased breath sounds; resonant to percussion Cardiovascular-normal PMI; normal S1 and S2; modest systolic murmur Abdomen-normal bowel sounds; soft and non-tender without masses or organomegaly Musculoskeletal-No deformities, no cyanosis or clubbing Neurologic-Normal cranial nerves; symmetric strength and tone Skin-Warm, no significant lesions Extremities-distal pulses intact; no edema  ASSESSMENT AND PLAN:  Lacey Bing, MD 05/12/2011 3:16 PM

## 2011-05-12 NOTE — Assessment & Plan Note (Addendum)
Pain and limited mobility in her left prosthetic knee is the issue that most impairs her quality of life at present.  Her case was discussed with Dr. Lequita Halt, who agrees to assess the extent of disease and to determine if additional intervention is required.

## 2011-05-12 NOTE — Assessment & Plan Note (Signed)
Mild intermittent symptoms requiring minimal medical treatment.

## 2011-05-12 NOTE — Assessment & Plan Note (Signed)
Diabetic control was good when last assessed a few months ago.

## 2011-05-15 ENCOUNTER — Other Ambulatory Visit: Payer: Self-pay | Admitting: Cardiology

## 2011-05-15 ENCOUNTER — Other Ambulatory Visit: Payer: Self-pay | Admitting: *Deleted

## 2011-05-15 MED ORDER — PRAVASTATIN SODIUM 80 MG PO TABS: 80.0000 mg | ORAL_TABLET | Freq: Every day | ORAL | Status: DC

## 2011-05-15 NOTE — Telephone Encounter (Signed)
Spoke with patient about increasing pravastatin to 80 mg, per office note and obtaining labs in 1 month.  Pt states that she already had lipids drawn, as instructed and would like to wait until those results were back to increase dose.

## 2011-05-16 ENCOUNTER — Encounter: Payer: Self-pay | Admitting: *Deleted

## 2011-05-16 LAB — LIPID PANEL
HDL: 58 mg/dL (ref >39)
LDL Cholesterol: 66 mg/dL (ref 0–99)
Total CHOL/HDL Ratio: 2.3 Ratio
Triglycerides: 48 mg/dL (ref <150)
VLDL: 10 mg/dL (ref 0–40)

## 2011-05-20 DIAGNOSIS — E0500 Thyrotoxicosis with diffuse goiter without thyrotoxic crisis or storm: Secondary | ICD-10-CM | POA: Insufficient documentation

## 2011-05-20 DIAGNOSIS — H05839 Thyroid orbitopathy, unspecified orbit: Secondary | ICD-10-CM | POA: Insufficient documentation

## 2011-09-07 ENCOUNTER — Other Ambulatory Visit: Payer: Self-pay | Admitting: Cardiology

## 2011-12-23 ENCOUNTER — Other Ambulatory Visit: Payer: Self-pay | Admitting: Cardiology

## 2012-02-06 ENCOUNTER — Emergency Department (HOSPITAL_COMMUNITY)
Admission: EM | Admit: 2012-02-06 | Discharge: 2012-02-06 | Disposition: A | Discharge status: Home / Self Care | Payer: Medicare Other | Attending: Emergency Medicine | Admitting: Emergency Medicine

## 2012-02-06 ENCOUNTER — Other Ambulatory Visit: Payer: Self-pay

## 2012-02-06 ENCOUNTER — Emergency Department (HOSPITAL_COMMUNITY): Payer: Medicare Other

## 2012-02-06 ENCOUNTER — Encounter (HOSPITAL_COMMUNITY): Payer: Self-pay

## 2012-02-06 DIAGNOSIS — R42 Dizziness and giddiness: Secondary | ICD-10-CM | POA: Insufficient documentation

## 2012-02-06 DIAGNOSIS — Z8659 Personal history of other mental and behavioral disorders: Secondary | ICD-10-CM | POA: Insufficient documentation

## 2012-02-06 DIAGNOSIS — E669 Obesity, unspecified: Secondary | ICD-10-CM | POA: Insufficient documentation

## 2012-02-06 DIAGNOSIS — Z8719 Personal history of other diseases of the digestive system: Secondary | ICD-10-CM | POA: Insufficient documentation

## 2012-02-06 DIAGNOSIS — R0602 Shortness of breath: Secondary | ICD-10-CM | POA: Insufficient documentation

## 2012-02-06 DIAGNOSIS — J45909 Unspecified asthma, uncomplicated: Secondary | ICD-10-CM | POA: Insufficient documentation

## 2012-02-06 DIAGNOSIS — E0500 Thyrotoxicosis with diffuse goiter without thyrotoxic crisis or storm: Secondary | ICD-10-CM | POA: Insufficient documentation

## 2012-02-06 DIAGNOSIS — E119 Type 2 diabetes mellitus without complications: Secondary | ICD-10-CM | POA: Insufficient documentation

## 2012-02-06 DIAGNOSIS — Z87891 Personal history of nicotine dependence: Secondary | ICD-10-CM | POA: Insufficient documentation

## 2012-02-06 DIAGNOSIS — R079 Chest pain, unspecified: Secondary | ICD-10-CM | POA: Insufficient documentation

## 2012-02-06 DIAGNOSIS — I10 Essential (primary) hypertension: Secondary | ICD-10-CM | POA: Insufficient documentation

## 2012-02-06 DIAGNOSIS — E785 Hyperlipidemia, unspecified: Secondary | ICD-10-CM | POA: Insufficient documentation

## 2012-02-06 DIAGNOSIS — Z79899 Other long term (current) drug therapy: Secondary | ICD-10-CM | POA: Insufficient documentation

## 2012-02-06 DIAGNOSIS — Z862 Personal history of diseases of the blood and blood-forming organs and certain disorders involving the immune mechanism: Secondary | ICD-10-CM | POA: Insufficient documentation

## 2012-02-06 LAB — BASIC METABOLIC PANEL
Chloride: 103 mEq/L (ref 96–112)
GFR calc Af Amer: 75 mL/min — ABNORMAL LOW (ref >90)
GFR calc non Af Amer: 64 mL/min — ABNORMAL LOW (ref >90)
Potassium: 3.5 mEq/L (ref 3.5–5.1)
Sodium: 142 mEq/L (ref 135–145)

## 2012-02-06 LAB — CBC WITH DIFFERENTIAL/PLATELET
Basophils Absolute: 0 10*3/uL (ref 0.0–0.1)
Basophils Relative: 0 % (ref 0–1)
Eosinophils Absolute: 0.2 10*3/uL (ref 0.0–0.7)
Hemoglobin: 11.8 g/dL — ABNORMAL LOW (ref 12.0–15.0)
MCHC: 33.3 g/dL (ref 30.0–36.0)
Neutro Abs: 2.4 10*3/uL (ref 1.7–7.7)
Neutrophils Relative %: 43 % (ref 43–77)
Platelets: 256 10*3/uL (ref 150–400)
RDW: 16.7 % — ABNORMAL HIGH (ref 11.5–15.5)

## 2012-02-06 LAB — TROPONIN I: Troponin I: <0.3 ng/mL (ref <0.30)

## 2012-02-06 MED ORDER — PANTOPRAZOLE SODIUM 40 MG PO TBEC
40.0000 mg | DELAYED_RELEASE_TABLET | Freq: Once | ORAL | Status: AC
  Administered 2012-02-06: 40 mg via ORAL
  Filled 2012-02-06: qty 1

## 2012-02-06 MED ORDER — PANTOPRAZOLE SODIUM 40 MG PO TBEC: 40.0000 mg | DELAYED_RELEASE_TABLET | Freq: Once | ORAL | Status: DC

## 2012-02-06 NOTE — ED Provider Notes (Signed)
History     CSN: [id number]  Arrival date & time 02/06/12  2038   First MD Initiated Contact with Patient 02/06/12 2147      Chief Complaint  Patient presents with  . Chest Pain    (Consider location/radiation/quality/duration/timing/severity/associated sxs/prior treatment) HPI  FELICE HOPE is a 57 y.o. female with a h/o of Grave's disease, hypertension, high cholesterol, and DM who presents to the Emergency Department complaining of gradually worsening, intermittent, sudden onset central chest pain that comes and goes increments of 1 minutes and is not aggravated by burping with associated intermittent SOB and lightheadedness that began when she awoke at 0600. She denies any associated coughing or decreased appetite. Her cardiologist is Dr. Dietrich Pates who performed 2 catharizations 20 years ago. She denies any h/o of MI or heart failure. Her PCP is Dr. Lubertha South. She is a nonsmoker and is currently not working.  Past Medical History  Diagnosis Date  . Hypertension     Severe; normal coronary angiography in 1995; echocardiogram in 05/2005-EF of 65-70%, moderate LVH; negative stress nuclear in 2007  . Diabetes mellitus, type 2   . Asthma   . Grave's disease     With ophthalmic involvement and hyperthyroidism  . Anemia, iron deficiency     No GI source identified; possibly due to menstrual blood loss  . Osteoarthritis 2003    S/p left TKA In 2003  . Anxiety   . Tobacco abuse, in remission     20 pack years, discontinued 1990  . Hyperlipidemia   . Small bowel obstruction 02/2008    umbilical hernia; diverticulosis  . Obesity   . Graves disease     Past Surgical History  Procedure Date  . Total knee arthroplasty 2003    Left  . Total thyroidectomy   . Colonoscopy 2007    negative except for diverticulosis  . Tubal ligation 1985    Bilateral  . Colonoscopy w/ polypectomy 2009    No family history on file.  History  Substance Use Topics  . Smoking status: Former  Smoker -- 1.0 packs/day for 20 years    Types: Cigarettes  . Smokeless tobacco: Never Used     Comment: Quit in 1996  . Alcohol Use: No    OB History    Grav Para Term Preterm Abortions TAB SAB Ect Mult Living                  Review of Systems A complete 10 system review of systems was obtained and all systems are negative except as noted in the HPI and PMH.  Allergies  Labetalol and Procardia  Home Medications   Current Outpatient Rx  Name  Route  Sig  Dispense  Refill  . ACETAMINOPHEN 500 MG PO TABS   Oral   Take 1,000 mg by mouth 3 (three) times daily as needed. For pain         . ALBUTEROL SULFATE HFA 108 (90 BASE) MCG/ACT IN AERS   Inhalation   Inhale 2 puffs into the lungs every 4 (four) hours as needed.           . ALBUTEROL SULFATE (2.5 MG/3ML) 0.083% IN NEBU   Nebulization   Take 2.5 mg by nebulization every 4 (four) hours as needed.           Marland Kitchen AMLODIPINE BESYLATE 10 MG PO TABS   Oral   Take 10 mg by mouth every morning.          Marland Kitchen  REFRESH P.M. OP OINT      as needed.           Marland Kitchen BENAZEPRIL HCL 40 MG PO TABS   Oral   Take 40 mg by mouth every morning.          Marland Kitchen CLONIDINE HCL 0.2 MG/24HR TD PTWK   Transdermal   Place 1 patch onto the skin once a week.         Marland Kitchen CLONIDINE HCL 0.2 MG/24HR TD PTWK               . CYCLOSPORINE 0.05 % OP EMUL   Both Eyes   Place 1 drop into both eyes every 12 (twelve) hours.           Marland Kitchen GLIPIZIDE ER 5 MG PO TB24   Oral   Take 5 mg by mouth every morning.         Marland Kitchen HYDROCHLOROTHIAZIDE 25 MG PO TABS   Oral   Take 25 mg by mouth every morning.          Marland Kitchen LABETALOL HCL 300 MG PO TABS   Oral   Take 300 mg by mouth 2 (two) times daily.         Marland Kitchen LEVOTHYROXINE SODIUM 100 MCG PO TABS   Oral   Take 100 mcg by mouth every morning.         Marland Kitchen METFORMIN HCL 500 MG PO TABS   Oral   Take 500 mg by mouth 2 (two) times daily.          Marland Kitchen PRAVASTATIN SODIUM 80 MG PO TABS   Oral   Take 40  mg by mouth at bedtime.         Marland Kitchen PANTOPRAZOLE SODIUM 40 MG PO TBEC   Oral   Take 1 tablet (40 mg total) by mouth once.   30 tablet   0     BP 161/79  Pulse 69  Temp 98.4 F (36.9 C) (Oral)  Resp 18  Ht 5\' 5"  (1.651 m)  Wt 245 lb (111.131 kg)  BMI 40.77 kg/m2  SpO2 98%  Physical Exam  Nursing note and vitals reviewed. Constitutional: She is oriented to person, place, and time. She appears well-developed and well-nourished.       She is obese.  HENT:  Head: Normocephalic and atraumatic.  Eyes: Conjunctivae normal and EOM are normal. Pupils are equal, round, and reactive to light.  Neck: Normal range of motion and phonation normal. Neck supple.  Cardiovascular: Normal rate, regular rhythm and intact distal pulses.   Pulmonary/Chest: Effort normal and breath sounds normal. She exhibits no tenderness.  Abdominal: Soft. She exhibits no distension. There is no tenderness. There is no guarding.  Musculoskeletal: Normal range of motion.  Neurological: She is alert and oriented to person, place, and time. She has normal strength. She exhibits normal muscle tone.  Skin: Skin is warm and dry.  Psychiatric: She has a normal mood and affect. Her behavior is normal. Judgment and thought content normal.    ED Course  Procedures (including critical care time)  DIAGNOSTIC STUDIES: Oxygen Saturation is 96% on room air, adequate by my interpretation.    COORDINATION OF CARE:  21:51- Discussed planned course of treatment with the patient, including Protonix, who is agreeable at this time.  22:00- Medication Orders- Pantoprazole (protonix) ec tablet 40 mg- once.      Date: 10/31/2011  Rate: 76  Rhythm: normal sinus rhythm  QRS Axis: normal  PR and QT Intervals: normal  ST/T Wave abnormalities: normal  PR and QRS Conduction Disutrbances:none  Narrative Interpretation:   Old EKG Reviewed: unchanged   Labs Reviewed  CBC WITH DIFFERENTIAL - Abnormal; Notable for the  following:    Hemoglobin 11.8 (*)     HCT 35.4 (*)     RDW 16.7 (*)     All other components within normal limits  BASIC METABOLIC PANEL - Abnormal; Notable for the following:    Glucose, Bld 130 (*)     GFR calc non Af Amer 64 (*)     GFR calc Af Amer 75 (*)     All other components within normal limits  TROPONIN I   Dg Chest Portable 1 View  02/06/2012  *RADIOLOGY REPORT*  Clinical Data: Chest pain.  Generalized weakness.  PORTABLE CHEST - 1 VIEW 02/06/2012 2052 hours:  Comparison: Two-view chest x-ray 11/21/2005, 08/10/2005.  Findings: Suboptimal inspiration accounts for crowded bronchovascular markings, especially in the lung bases, and accentuates the cardiac silhouette.  Taking this into account, cardiomediastinal silhouette unremarkable and lungs clear.  IMPRESSION: Suboptimal inspiration.  No acute cardiopulmonary disease.   Original Report Authenticated By: Hulan Saas, M.D.      1. Chest pain, unspecified       MDM  Atypical, for cardiac, chest pain. Patient has risk factors for cardiac disease, she is TIMI 1. Differential diagnosis includes GI etiology. ED evaluation is negative for acute abnormalities. Patient is stable for discharge with close followup. She has access to care through her cardiology service.    Plan: Home Medications- Protonix; Home Treatments- Rest; Recommended follow up- Cardiology,        I personally performed the services described in this documentation, which was scribed in my presence. The recorded information has been reviewed and is accurate.          Flint Melter, MD 02/06/12 2303

## 2012-02-06 NOTE — ED Notes (Signed)
MD at bedside. 

## 2012-02-06 NOTE — ED Notes (Signed)
Mid sternal chest pain that started this am, intermittent all day but more frequent tonight.  Pt states pain comes every few minutes and only lasts a few minutes at a time, c/o mild sob and mild dizziness with same, denies pain at this moment

## 2012-02-12 ENCOUNTER — Ambulatory Visit (INDEPENDENT_AMBULATORY_CARE_PROVIDER_SITE_OTHER): Payer: Medicare Other | Admitting: Adult Health

## 2012-02-12 ENCOUNTER — Encounter: Payer: Self-pay | Admitting: Adult Health

## 2012-02-12 ENCOUNTER — Encounter: Payer: Self-pay | Admitting: *Deleted

## 2012-02-12 VITALS — BP 134/82 | HR 87 | Ht 65.0 in | Wt 266.0 lb

## 2012-02-12 DIAGNOSIS — R0789 Other chest pain: Secondary | ICD-10-CM | POA: Insufficient documentation

## 2012-02-12 DIAGNOSIS — I10 Essential (primary) hypertension: Secondary | ICD-10-CM

## 2012-02-12 NOTE — Assessment & Plan Note (Signed)
Excellent control currently. No changes in medications. 

## 2012-02-12 NOTE — Patient Instructions (Addendum)
Your physician recommends that you schedule a follow-up appointment in: POST TESTS WITH RR  YOUR PHYSICIAN RECOMMENDS THAT YOU HAVE A LEXISCAN MYOVIEW

## 2012-02-12 NOTE — Assessment & Plan Note (Addendum)
Most recent stress test completed in 2007 negative for ischemia. Doubt cardiac etiology for chest pain, but has risk factors for CAD, hypertension, obesity, diabetes,tobacco abuse history and hyperlipidemia.  Will schedule a lexiscan myoview with follow up appointment with Dr.Rothbart to discuss the results. She requests to see Dr.Rothbart on follow up.

## 2012-02-12 NOTE — Progress Notes (Deleted)
Name: [name]    DOB: 03/25/1955  Age: 57 y.o.  MR#: [id number]       PCP:  Harlow Asa, MD      Insurance: @PAYORNAME @   CC:   No chief complaint on file.   VS BP 134/82  Pulse 87  Ht 5\' 5"  (1.651 m)  Wt 266 lb (120.657 kg)  BMI 44.26 kg/m2  SpO2 97%  Weights Current Weight  02/12/12 266 lb (120.657 kg)  02/06/12 245 lb (111.131 kg)  05/12/11 272 lb (123.378 kg)    Blood Pressure  BP Readings from Last 3 Encounters:  02/12/12 134/82  02/06/12 161/79  05/12/11 160/85     Admit date:  (Not on file) Last encounter with RMR:  Visit date not found   Allergy Allergies  Allergen Reactions  . Procardia (Nifedipine)     Current Outpatient Prescriptions  Medication Sig Dispense Refill  . acetaminophen (TYLENOL) 500 MG tablet Take 1,000 mg by mouth 3 (three) times daily as needed. For pain      . albuterol (PROVENTIL HFA;VENTOLIN HFA) 108 (90 BASE) MCG/ACT inhaler Inhale 2 puffs into the lungs every 4 (four) hours as needed.        Marland Kitchen albuterol (PROVENTIL) (2.5 MG/3ML) 0.083% nebulizer solution Take 2.5 mg by nebulization every 4 (four) hours as needed.        Marland Kitchen amLODipine (NORVASC) 10 MG tablet Take 10 mg by mouth every morning.       . Artificial Tear Ointment (ARTIFICIAL TEARS) ointment as needed.        Marland Kitchen aspirin 81 MG tablet Take 81 mg by mouth daily.      . benazepril (LOTENSIN) 40 MG tablet Take 40 mg by mouth every morning.       . cloNIDine (CATAPRES - DOSED IN MG/24 HR) 0.2 mg/24hr patch Place 1 patch onto the skin once a week.      . cycloSPORINE (RESTASIS) 0.05 % ophthalmic emulsion Place 1 drop into both eyes every 12 (twelve) hours.        Marland Kitchen glipiZIDE (GLUCOTROL XL) 5 MG 24 hr tablet Take 5 mg by mouth every morning.      . hydrochlorothiazide (HYDRODIURIL) 25 MG tablet Take 25 mg by mouth every morning.       . labetalol (NORMODYNE) 300 MG tablet Take 300 mg by mouth 2 (two) times daily.      Marland Kitchen levothyroxine (SYNTHROID, LEVOTHROID) 100 MCG tablet Take 100  mcg by mouth every morning.      . metFORMIN (GLUCOPHAGE) 500 MG tablet Take 500 mg by mouth 2 (two) times daily.       . pantoprazole (PROTONIX) 40 MG tablet Take 1 tablet (40 mg total) by mouth once.  30 tablet  0  . pravastatin (PRAVACHOL) 80 MG tablet Take 40 mg by mouth at bedtime.        Discontinued Meds:    Medications Discontinued During This Encounter  Medication Reason  . cloNIDine (CATAPRES - DOSED IN MG/24 HR) 0.2 mg/24hr patch Error    Patient Active Problem List  Diagnosis  . Hypertension  . Diabetes mellitus, type 2  . Hyperthyroidism  . Tobacco abuse, in remission  . Hyperlipidemia  . Anemia, iron deficiency  . Obesity  . Asthma  . Degenerative joint disease  . Osteoarthritis    LABS Admission on 02/06/2012, Discharged on 02/06/2012  Component Date Value  . WBC 02/06/2012 5.6   . RBC 02/06/2012 4.27   .  Hemoglobin 02/06/2012 11.8*  . HCT 02/06/2012 35.4*  . MCV 02/06/2012 82.9   . [facility] 02/06/2012 27.6   . MCHC 02/06/2012 33.3   . RDW 02/06/2012 16.7*  . Platelets 02/06/2012 256   . Neutrophils Relative 02/06/2012 43   . Neutro Abs 02/06/2012 2.4   . Lymphocytes Relative 02/06/2012 45   . Lymphs Abs 02/06/2012 2.5   . Monocytes Relative 02/06/2012 8   . Monocytes Absolute 02/06/2012 0.4   . Eosinophils Relative 02/06/2012 4   . Eosinophils Absolute 02/06/2012 0.2   . Basophils Relative 02/06/2012 0   . Basophils Absolute 02/06/2012 0.0   . Sodium 02/06/2012 142   . Potassium 02/06/2012 3.5   . Chloride 02/06/2012 103   . CO2 02/06/2012 28   . Glucose, Bld 02/06/2012 130*  . BUN 02/06/2012 17   . Creatinine, Ser 02/06/2012 0.96   . Calcium 02/06/2012 9.3   . GFR calc non Af Amer 02/06/2012 64*  . GFR calc Af Amer 02/06/2012 75*  . Troponin I 02/06/2012 <0.30      Results for this Opt Visit:     Results for orders placed during the hospital encounter of 02/06/12  CBC WITH DIFFERENTIAL      Component Value Range   WBC 5.6  4.0 - 10.5 K/uL    RBC 4.27  3.87 - 5.11 MIL/uL   Hemoglobin 11.8 (*) 12.0 - 15.0 g/dL   HCT 40.9 (*) 81.1 - 91.4 %   MCV 82.9  78.0 - 100.0 fL   MCH 27.6  26.0 - 34.0 pg   MCHC 33.3  30.0 - 36.0 g/dL   RDW 78.2 (*) 95.6 - 21.3 %   Platelets 256  150 - 400 K/uL   Neutrophils Relative 43  43 - 77 %   Neutro Abs 2.4  1.7 - 7.7 K/uL   Lymphocytes Relative 45  12 - 46 %   Lymphs Abs 2.5  0.7 - 4.0 K/uL   Monocytes Relative 8  3 - 12 %   Monocytes Absolute 0.4  0.1 - 1.0 K/uL   Eosinophils Relative 4  0 - 5 %   Eosinophils Absolute 0.2  0.0 - 0.7 K/uL   Basophils Relative 0  0 - 1 %   Basophils Absolute 0.0  0.0 - 0.1 K/uL  BASIC METABOLIC PANEL      Component Value Range   Sodium 142  135 - 145 mEq/L   Potassium 3.5  3.5 - 5.1 mEq/L   Chloride 103  96 - 112 mEq/L   CO2 28  19 - 32 mEq/L   Glucose, Bld 130 (*) 70 - 99 mg/dL   BUN 17  6 - 23 mg/dL   Creatinine, Ser 0.86  0.50 - 1.10 mg/dL   Calcium 9.3  8.4 - 57.8 mg/dL   GFR calc non Af Amer 64 (*) >90 mL/min   GFR calc Af Amer 75 (*) >90 mL/min  TROPONIN I      Component Value Range   Troponin I <0.30  <0.30 ng/mL    EKG Orders placed during the hospital encounter of 02/06/12  . ED EKG  . ED EKG  . EKG     Prior Assessment and Plan Problem List as of 02/12/2012          Hypertension   Last Assessment & Plan Note   05/12/2011 Office Visit Addendum 05/12/2011  4:47 PM by Kathlen Brunswick, MD    Blood pressure is slightly high at  this visit, but patient brings a list of 50 additional determinations at home, all of which are perfectly normal.  Current medication will be continued.    Diabetes mellitus, type 2   Last Assessment & Plan Note   05/12/2011 Office Visit Signed 05/12/2011  4:40 PM by Kathlen Brunswick, MD    Diabetic control was good when last assessed a few months ago.    Hyperthyroidism   Last Assessment & Plan Note   11/15/2010 Office Visit Signed 11/15/2010  4:26 PM by Kathlen Brunswick, MD    No recent TSH is available.   One will be obtained.    Tobacco abuse, in remission   Hyperlipidemia   Last Assessment & Plan Note   05/12/2011 Office Visit Signed 05/12/2011  4:41 PM by Kathlen Brunswick, MD    Hyperlipidemia control was suboptimal when assessed a year ago; dose of pravastatin will be increased to 80 mg per day with a repeat lipid profile obtained one month thereafter.    Anemia, iron deficiency   Last Assessment & Plan Note   11/15/2010 Office Visit Signed 11/15/2010  4:18 PM by Kathlen Brunswick, MD    No recent CBC available-one will be obtained.    Obesity   Last Assessment & Plan Note   11/15/2010 Office Visit Addendum 11/16/2010 11:18 PM by Kathlen Brunswick, MD    She agrees to attempt caloric restriction and weight loss on her own as well as increase physical activity.  If this does not prove  successful, professional assistance will be required.    Asthma   Last Assessment & Plan Note   05/12/2011 Office Visit Signed 05/12/2011  4:39 PM by Kathlen Brunswick, MD    Mild intermittent symptoms requiring minimal medical treatment.    Degenerative joint disease   Osteoarthritis   Last Assessment & Plan Note   05/12/2011 Office Visit Addendum 05/13/2011 10:01 AM by Kathlen Brunswick, MD    Pain and limited mobility in her left prosthetic knee is the issue that most impairs her quality of life at present.  Her case was discussed with Dr. Lequita Halt, who agrees to assess the extent of disease and to determine if additional intervention is required.        Imaging: Dg Chest Portable 1 View  02/06/2012  *RADIOLOGY REPORT*  Clinical Data: Chest pain.  Generalized weakness.  PORTABLE CHEST - 1 VIEW 02/06/2012 2052 hours:  Comparison: Two-view chest x-ray 11/21/2005, 08/10/2005.  Findings: Suboptimal inspiration accounts for crowded bronchovascular markings, especially in the lung bases, and accentuates the cardiac silhouette.  Taking this into account, cardiomediastinal silhouette unremarkable and lungs clear.   IMPRESSION: Suboptimal inspiration.  No acute cardiopulmonary disease.   Original Report Authenticated By: Hulan Saas, M.D.      [facility] Calculation: Score not calculated. Missing: Total Cholesterol

## 2012-02-12 NOTE — Progress Notes (Signed)
HPI: [name] is a 57 y/o obese patient of Dr.Rothbart we are seeing for ongoing assessment and treatment of hypertension, with history of diabetes, hyperthyroidism, and iron deficiency anemia. She was recently seen in the ER on 02/06/2012 for atypical chest pain, gradually worsening, with frequent burping, and intermittent shortness of breath. In the ER ruled out for ACS, placed on a PPI but was unable to tolerate it, due to headache. Dr.Luking placed her on carafate slurry on follow up appointment and a baby ASA daily.. She has had no further complaints since that time. She is medically compliant.  Allergies  Allergen Reactions  . Procardia (Nifedipine)     Current Outpatient Prescriptions  Medication Sig Dispense Refill  . acetaminophen (TYLENOL) 500 MG tablet Take 1,000 mg by mouth 3 (three) times daily as needed. For pain      . albuterol (PROVENTIL HFA;VENTOLIN HFA) 108 (90 BASE) MCG/ACT inhaler Inhale 2 puffs into the lungs every 4 (four) hours as needed.        Marland Kitchen albuterol (PROVENTIL) (2.5 MG/3ML) 0.083% nebulizer solution Take 2.5 mg by nebulization every 4 (four) hours as needed.        Marland Kitchen amLODipine (NORVASC) 10 MG tablet Take 10 mg by mouth every morning.       . Artificial Tear Ointment (ARTIFICIAL TEARS) ointment as needed.        Marland Kitchen aspirin 81 MG tablet Take 81 mg by mouth daily.      . benazepril (LOTENSIN) 40 MG tablet Take 40 mg by mouth every morning.       . cloNIDine (CATAPRES - DOSED IN MG/24 HR) 0.2 mg/24hr patch Place 1 patch onto the skin once a week.      . cycloSPORINE (RESTASIS) 0.05 % ophthalmic emulsion Place 1 drop into both eyes every 12 (twelve) hours.        Marland Kitchen glipiZIDE (GLUCOTROL XL) 5 MG 24 hr tablet Take 5 mg by mouth every morning.      . hydrochlorothiazide (HYDRODIURIL) 25 MG tablet Take 25 mg by mouth every morning.       . labetalol (NORMODYNE) 300 MG tablet Take 300 mg by mouth 2 (two) times daily.      Marland Kitchen levothyroxine (SYNTHROID, LEVOTHROID) 100  MCG tablet Take 100 mcg by mouth every morning.      . metFORMIN (GLUCOPHAGE) 500 MG tablet Take 500 mg by mouth 2 (two) times daily.       . pantoprazole (PROTONIX) 40 MG tablet Take 1 tablet (40 mg total) by mouth once.  30 tablet  0  . pravastatin (PRAVACHOL) 80 MG tablet Take 40 mg by mouth at bedtime.        Past Medical History  Diagnosis Date  . Hypertension     Severe; normal coronary angiography in 1995; echocardiogram in 05/2005-EF of 65-70%, moderate LVH; negative stress nuclear in 2007  . Diabetes mellitus, type 2   . Asthma   . Grave's disease     With ophthalmic involvement and hyperthyroidism  . Anemia, iron deficiency     No GI source identified; possibly due to menstrual blood loss  . Osteoarthritis 2003    S/p left TKA In 2003  . Anxiety   . Tobacco abuse, in remission     20 pack years, discontinued 1990  . Hyperlipidemia   . Small bowel obstruction 02/2008    umbilical hernia; diverticulosis  . Obesity   . Graves disease     Past Surgical History  Procedure Date  . Total knee arthroplasty 2003    Left  . Total thyroidectomy   . Colonoscopy 2007    negative except for diverticulosis  . Tubal ligation 1985    Bilateral  . Colonoscopy w/ polypectomy 2009    NUU:[id number] of systems complete and found to be negative unless listed above  PHYSICAL EXAM BP 134/82  Pulse 87  Ht 5\' 5"  (1.651 m)  Wt 266 lb (120.657 kg)  BMI 44.26 kg/m2  SpO2 97% \General: Well developed, well nourished, in no acute distress Head: Eyes PERRLA, No xanthomas.   Normal cephalic and atramatic  Lungs: Clear bilaterally to auscultation and percussion. Heart: HRRR S1 S2, without MRG.  Pulses are 2+ & equal.            No carotid bruit. No JVD.  No abdominal bruits. No femoral bruits. Abdomen: Bowel sounds are positive, abdomen soft and non-tender without masses or                  Hernia's noted. Msk:  Back normal, normal gait. Normal strength and tone for age, S/P left TKA uses a  cane for ambulation. Extremities: No clubbing, cyanosis or edema.  DP +1 Neuro: Alert and oriented X 3. Psych:  Good affect, responds appropriately  EKG:  ASSESSMENT AND PLAN

## 2012-02-19 ENCOUNTER — Encounter (HOSPITAL_COMMUNITY)
Admission: RE | Admit: 2012-02-19 | Discharge: 2012-02-19 | Disposition: A | Discharge status: Home / Self Care | Payer: Medicare Other | Source: Ambulatory Visit | Attending: Adult Health | Admitting: Adult Health

## 2012-02-19 ENCOUNTER — Encounter (HOSPITAL_COMMUNITY): Payer: Self-pay

## 2012-02-19 ENCOUNTER — Ambulatory Visit (HOSPITAL_COMMUNITY)
Admission: RE | Admit: 2012-02-19 | Discharge: 2012-02-19 | Disposition: A | Discharge status: Home / Self Care | Payer: Medicare Other | Source: Ambulatory Visit | Attending: Cardiology | Admitting: Cardiology

## 2012-02-19 DIAGNOSIS — I10 Essential (primary) hypertension: Secondary | ICD-10-CM

## 2012-02-19 DIAGNOSIS — R079 Chest pain, unspecified: Secondary | ICD-10-CM | POA: Insufficient documentation

## 2012-02-19 DIAGNOSIS — R0789 Other chest pain: Secondary | ICD-10-CM | POA: Insufficient documentation

## 2012-02-19 MED ORDER — TECHNETIUM TC 99M SESTAMIBI - CARDIOLITE
10.0000 | Freq: Once | INTRAVENOUS | Status: AC | PRN
  Administered 2012-02-19: 10 via INTRAVENOUS

## 2012-02-19 MED ORDER — REGADENOSON 0.4 MG/5ML IV SOLN
INTRAVENOUS | Status: AC
  Administered 2012-02-19: 0.4 mg via INTRAVENOUS
  Filled 2012-02-19: qty 5

## 2012-02-19 MED ORDER — TECHNETIUM TC 99M SESTAMIBI - CARDIOLITE
30.0000 | Freq: Once | INTRAVENOUS | Status: AC | PRN
  Administered 2012-02-19: 28.5 via INTRAVENOUS

## 2012-02-19 MED ORDER — SODIUM CHLORIDE 0.9 % IJ SOLN
INTRAMUSCULAR | Status: AC
  Administered 2012-02-19: 10 mL via INTRAVENOUS
  Filled 2012-02-19: qty 10

## 2012-02-19 NOTE — Progress Notes (Signed)
Stress Lab Nurses Notes - [name]  [name] 02/19/2012 Reason for doing test: Chest Pain Type of test: Eugenie Birks cardiolite Nurse performing test: Parke Poisson, RN Nuclear Medicine Tech: Lyndel Pleasure Echo Tech: Not Applicable MD performing test: R. Rothbart & Joni Reining NP Family MD: Lubertha South Test explained and consent signed: yes IV started: 22g jelco, Saline lock flushed, No redness or edema and Saline lock started in radiology Symptoms: SOB Treatment/Intervention: None Reason test stopped: protocol completed After recovery IV was: Discontinued via X-ray tech and No redness or edema Patient to return to Nuc. Med at : 11:45 Patient discharged: Home Patient's Condition upon discharge was: stable Comments: During test BP 198/90 & HR 101.  Recovery BP 140/74 & HR 73.  Symptoms resolved in recovery. [name] T

## 2012-02-27 ENCOUNTER — Ambulatory Visit: Payer: Medicare Other | Admitting: Cardiology

## 2012-03-01 ENCOUNTER — Telehealth: Payer: Self-pay | Admitting: Cardiology

## 2012-03-01 NOTE — Telephone Encounter (Signed)
PT WAS ON [facility] FOR 03/01/12, I CALLED TO R/S APPT SHE STATES THAT SHE WAS ALREADY GIVEN HER RESULTS.  PT NEEDS TO KNOW WHEN SHE SHOULD FOLLOW UP NEXT.

## 2012-03-01 NOTE — Telephone Encounter (Signed)
Spoke with patient and was advised that Samara Deist wanted her to follow up with Dr Dietrich Pates, regardless of results of stress test.  Appt made for 2/26 @ 3:15.

## 2012-03-10 ENCOUNTER — Ambulatory Visit: Payer: Medicare Other | Admitting: Cardiology

## 2012-03-17 ENCOUNTER — Other Ambulatory Visit: Payer: Self-pay | Admitting: Cardiology

## 2012-04-15 ENCOUNTER — Ambulatory Visit: Payer: Medicare Other | Admitting: Cardiology

## 2012-04-25 ENCOUNTER — Other Ambulatory Visit: Payer: Self-pay | Admitting: Family Medicine

## 2012-05-04 ENCOUNTER — Encounter: Payer: Self-pay | Admitting: Cardiology

## 2012-05-04 ENCOUNTER — Ambulatory Visit (INDEPENDENT_AMBULATORY_CARE_PROVIDER_SITE_OTHER): Payer: Medicare Other | Admitting: Cardiology

## 2012-05-04 VITALS — BP 136/74 | HR 82 | Ht 64.5 in | Wt 272.0 lb

## 2012-05-04 DIAGNOSIS — R0789 Other chest pain: Secondary | ICD-10-CM

## 2012-05-04 DIAGNOSIS — I10 Essential (primary) hypertension: Secondary | ICD-10-CM

## 2012-05-04 DIAGNOSIS — M1990 Unspecified osteoarthritis, unspecified site: Secondary | ICD-10-CM

## 2012-05-04 DIAGNOSIS — D509 Iron deficiency anemia, unspecified: Secondary | ICD-10-CM

## 2012-05-04 DIAGNOSIS — E119 Type 2 diabetes mellitus without complications: Secondary | ICD-10-CM

## 2012-05-04 NOTE — Progress Notes (Deleted)
Name: [name]    DOB: 08-24-1955  Age: 57 y.o.  MR#: [id number]       PCP:  Harlow Asa, MD      Insurance: Payor: [employer] [place] [employer]  Plan: BLUE MEDICARE  Product Type: *No Product type*    CC:   No chief complaint on file.  LIST VS Filed Vitals:   05/04/12 1323  BP: 136/74  Pulse: 82  Height: 5' 4.5" (1.638 m)  Weight: 272 lb (123.378 kg)  SpO2: 97%    Weights Current Weight  05/04/12 272 lb (123.378 kg)  02/12/12 266 lb (120.657 kg)  02/06/12 245 lb (111.131 kg)    Blood Pressure  BP Readings from Last 3 Encounters:  05/04/12 136/74  02/12/12 134/82  02/06/12 161/79     Admit date:  (Not on file) Last encounter with RMR:  04/15/2012   Allergy Procardia  Current Outpatient Prescriptions  Medication Sig Dispense Refill  . acetaminophen (TYLENOL) 500 MG tablet Take 1,000 mg by mouth 3 (three) times daily as needed. For pain      . albuterol (PROVENTIL HFA;VENTOLIN HFA) 108 (90 BASE) MCG/ACT inhaler Inhale 2 puffs into the lungs every 4 (four) hours as needed.        Marland Kitchen albuterol (PROVENTIL) (2.5 MG/3ML) 0.083% nebulizer solution Take 2.5 mg by nebulization every 4 (four) hours as needed.        Marland Kitchen amLODipine (NORVASC) 10 MG tablet Take 10 mg by mouth every morning.       . Artificial Tear Ointment (ARTIFICIAL TEARS) ointment as needed.        Marland Kitchen aspirin 81 MG tablet Take 81 mg by mouth daily.      . benazepril (LOTENSIN) 40 MG tablet Take 40 mg by mouth every morning.       . cloNIDine (CATAPRES - DOSED IN MG/24 HR) 0.2 mg/24hr patch APPLY 1 PATCH TRANSDERMALLY ONCE A WEEK  4 patch  6  . cycloSPORINE (RESTASIS) 0.05 % ophthalmic emulsion Place 1 drop into both eyes every 12 (twelve) hours.        Marland Kitchen glipiZIDE (GLUCOTROL XL) 5 MG 24 hr tablet Take 5 mg by mouth every morning.      . hydrochlorothiazide (HYDRODIURIL) 25 MG tablet Take 25 mg by mouth every morning.       . labetalol (NORMODYNE) 300 MG tablet Take 300 mg by mouth 2 (two) times daily.       Marland Kitchen levothyroxine (SYNTHROID, LEVOTHROID) 100 MCG tablet Take 100 mcg by mouth every morning.      . metFORMIN (GLUCOPHAGE) 500 MG tablet TAKE 2 TABLETS BY MOUTH TWICE DAILY  120 tablet  0  . pantoprazole (PROTONIX) 40 MG tablet Take 1 tablet (40 mg total) by mouth once.  30 tablet  0  . pravastatin (PRAVACHOL) 80 MG tablet Take 40 mg by mouth at bedtime.       No current facility-administered medications for this visit.    Discontinued Meds:    Medications Discontinued During This Encounter  Medication Reason  . cloNIDine (CATAPRES - DOSED IN MG/24 HR) 0.2 mg/24hr patch Error    Patient Active Problem List  Diagnosis  . Hypertension  . Diabetes mellitus, type 2  . Hyperthyroidism  . Tobacco abuse, in remission  . Hyperlipidemia  . Anemia, iron deficiency  . Obesity  . Asthma  . Degenerative joint disease  . Osteoarthritis  . Atypical chest pain    LABS  Component Value Date/Time   NA 142 02/06/2012 2030   NA 144 11/15/2010 1341   NA 139 11/19/2009 1641   K 3.5 02/06/2012 2030   K 3.7 11/15/2010 1341   K 3.6 11/19/2009 1641   CL 103 02/06/2012 2030   CL 105 11/15/2010 1341   CL 101 11/19/2009 1641   CO2 28 02/06/2012 2030   CO2 26 11/15/2010 1341   CO2 28 11/19/2009 1641   GLUCOSE 130* 02/06/2012 2030   GLUCOSE 157* 11/15/2010 1341   GLUCOSE 132* 11/19/2009 1641   BUN 17 02/06/2012 2030   BUN 12 11/15/2010 1341   BUN 25* 11/19/2009 1641   CREATININE 0.96 02/06/2012 2030   CREATININE 0.90 11/15/2010 1341   CREATININE 0.75 11/19/2009 1641   CREATININE 0.75 03/13/2008 0535   CALCIUM 9.3 02/06/2012 2030   CALCIUM 8.8 11/15/2010 1341   CALCIUM 9.8 11/19/2009 1641   GFRNONAA 64* 02/06/2012 2030   GFRNONAA >60 11/19/2009 1641   GFRNONAA >60 03/13/2008 0535   GFRAA 75* 02/06/2012 2030   GFRAA  Value: >60        The eGFR has been calculated using the MDRD equation. This calculation has not been validated in all clinical situations. eGFR's persistently <60 mL/min signify possible Chronic  Kidney Disease. 11/19/2009 1641   GFRAA  Value: >60        The eGFR has been calculated using the MDRD equation. This calculation has not been validated in all clinical situations. eGFR's persistently <60 mL/min signify possible Chronic Kidney Disease. 03/13/2008 0535   CMP     Component Value Date/Time   NA 142 02/06/2012 2030   K 3.5 02/06/2012 2030   CL 103 02/06/2012 2030   CO2 28 02/06/2012 2030   GLUCOSE 130* 02/06/2012 2030   BUN 17 02/06/2012 2030   CREATININE 0.96 02/06/2012 2030   CREATININE 0.90 11/15/2010 1341   CALCIUM 9.3 02/06/2012 2030   PROT 7.2 11/15/2010 1341   ALBUMIN 4.1 11/15/2010 1341   AST 21 11/15/2010 1341   ALT 12 11/15/2010 1341   ALKPHOS 85 11/15/2010 1341   BILITOT 0.2* 11/15/2010 1341   GFRNONAA 64* 02/06/2012 2030   GFRAA 75* 02/06/2012 2030       Component Value Date/Time   WBC 5.6 02/06/2012 2030   WBC 7.5 11/15/2010 1341   WBC 5.9 11/19/2009 1641   HGB 11.8* 02/06/2012 2030   HGB 11.0* 11/15/2010 1341   HGB 10.4* 11/19/2009 1641   HCT 35.4* 02/06/2012 2030   HCT 35.8* 11/15/2010 1341   HCT 31.0* 11/19/2009 1641   MCV 82.9 02/06/2012 2030   MCV 77.2* 11/15/2010 1341   MCV 82.3 11/19/2009 1641    Lipid Panel     Component Value Date/Time   CHOL 134 05/15/2011 0715   TRIG 48 05/15/2011 0715   HDL 58 05/15/2011 0715   CHOLHDL 2.3 05/15/2011 0715   VLDL 10 05/15/2011 0715   LDLCALC 66 05/15/2011 0715    ABG No results found for this basename: phart, pco2, pco2art, po2, po2art, hco3, tco2, acidbasedef, o2sat     Lab Results  Component Value Date   TSH 0.775 11/15/2010   BNP (last 3 results) No results found for this basename: PROBNP,  in the last 8760 hours Cardiac Panel (last 3 results) No results found for this basename: CKTOTAL, CKMB, TROPONINI, RELINDX,  in the last 72 hours  Iron/TIBC/Ferritin No results found for this basename: iron, tibc, ferritin     EKG Orders placed during the hospital  encounter of 02/06/12  . ED EKG  . ED EKG  . EKG     Prior  Assessment and Plan Problem List as of 05/04/2012     ICD-9-CM   Hypertension   Last Assessment & Plan   02/12/2012 Office Visit Written 02/12/2012  4:13 PM by Jodelle Gross, NP     Excellent control currently. No changes in medications.     Diabetes mellitus, type 2   Last Assessment & Plan   05/12/2011 Office Visit Written 05/12/2011  4:40 PM by Kathlen Brunswick, MD     Diabetic control was good when last assessed a few months ago.    Hyperthyroidism   Last Assessment & Plan   11/15/2010 Office Visit Written 11/15/2010  4:26 PM by Kathlen Brunswick, MD     No recent TSH is available.  One will be obtained.    Tobacco abuse, in remission   Hyperlipidemia   Last Assessment & Plan   05/12/2011 Office Visit Written 05/12/2011  4:41 PM by Kathlen Brunswick, MD     Hyperlipidemia control was suboptimal when assessed a year ago; dose of pravastatin will be increased to 80 mg per day with a repeat lipid profile obtained one month thereafter.    Anemia, iron deficiency   Last Assessment & Plan   11/15/2010 Office Visit Written 11/15/2010  4:18 PM by Kathlen Brunswick, MD     No recent CBC available-one will be obtained.    Obesity   Last Assessment & Plan   11/15/2010 Office Visit Edited 11/16/2010 11:18 PM by Kathlen Brunswick, MD     She agrees to attempt caloric restriction and weight loss on her own as well as increase physical activity.  If this does not prove  successful, professional assistance will be required.    Asthma   Last Assessment & Plan   05/12/2011 Office Visit Written 05/12/2011  4:39 PM by Kathlen Brunswick, MD     Mild intermittent symptoms requiring minimal medical treatment.    Degenerative joint disease   Osteoarthritis   Last Assessment & Plan   05/12/2011 Office Visit Edited 05/13/2011 10:01 AM by Kathlen Brunswick, MD     Pain and limited mobility in her left prosthetic knee is the issue that most impairs her quality of life at present.  Her case was discussed with  Dr. Lequita Halt, who agrees to assess the extent of disease and to determine if additional intervention is required.    Atypical chest pain   Last Assessment & Plan   02/12/2012 Office Visit Edited 02/12/2012  4:12 PM by Jodelle Gross, NP     Most recent stress test completed in 2007 negative for ischemia. Doubt cardiac etiology for chest pain, but has risk factors for CAD, hypertension, obesity, diabetes,tobacco abuse history and hyperlipidemia.  Will schedule a lexiscan myoview with follow up appointment with Dr.Rothbart to discuss the results. She requests to see Dr.Rothbart on follow up.         Imaging: No results found.

## 2012-05-04 NOTE — Assessment & Plan Note (Addendum)
Patient has had anemia in the past. This will be reassessed with a repeat CBC, iron studies and stool for Hemoccult testing.  Results will be directed to patient's PCP, Dr. Gerda Diss, who will determine whether further testing or treatment is warranted.

## 2012-05-04 NOTE — Assessment & Plan Note (Signed)
Patient promises to select a new orthopedic surgeon within the next few months. She is considering seeking evaluation at [facility].

## 2012-05-04 NOTE — Progress Notes (Signed)
Patient ID: [name], female   DOB: Nov 14, 1955, 57 y.o.   MRN: [id number]  HPI: Schedule return visit for this very pleasant woman with multiple cardiovascular risk factors. Since her last visit, she is doing generally well. She has not yet sought a second opinion regarding prior TKA with continuing disability. She walks with a cane, but experiences no difficulty with daily activities other than knee pain.  She monitors blood pressures at home and records consistently good values.  A stress nuclear study was negative in 01/2012.  Current Outpatient Prescriptions  Medication Sig Dispense Refill  . acetaminophen (TYLENOL) 500 MG tablet Take 1,000 mg by mouth 3 (three) times daily as needed. For pain      . albuterol (PROVENTIL HFA;VENTOLIN HFA) 108 (90 BASE) MCG/ACT inhaler Inhale 2 puffs into the lungs every 4 (four) hours as needed.        Marland Kitchen albuterol (PROVENTIL) (2.5 MG/3ML) 0.083% nebulizer solution Take 2.5 mg by nebulization every 4 (four) hours as needed.        Marland Kitchen amLODipine (NORVASC) 10 MG tablet Take 10 mg by mouth every morning.       . Artificial Tear Ointment (ARTIFICIAL TEARS) ointment as needed.        Marland Kitchen aspirin 81 MG tablet Take 81 mg by mouth daily.      . benazepril (LOTENSIN) 40 MG tablet Take 40 mg by mouth every morning.       . cloNIDine (CATAPRES - DOSED IN MG/24 HR) 0.2 mg/24hr patch APPLY 1 PATCH TRANSDERMALLY ONCE A WEEK  4 patch  6  . cycloSPORINE (RESTASIS) 0.05 % ophthalmic emulsion Place 1 drop into both eyes every 12 (twelve) hours.        Marland Kitchen glipiZIDE (GLUCOTROL XL) 5 MG 24 hr tablet Take 5 mg by mouth every morning.      . hydrochlorothiazide (HYDRODIURIL) 25 MG tablet Take 25 mg by mouth every morning.       . labetalol (NORMODYNE) 300 MG tablet Take 300 mg by mouth 2 (two) times daily.      Marland Kitchen levothyroxine (SYNTHROID, LEVOTHROID) 100 MCG tablet Take 100 mcg by mouth every morning.      . metFORMIN (GLUCOPHAGE) 500 MG tablet TAKE 2 TABLETS BY MOUTH TWICE DAILY   120 tablet  0  . pantoprazole (PROTONIX) 40 MG tablet Take 1 tablet (40 mg total) by mouth once.  30 tablet  0  . pravastatin (PRAVACHOL) 80 MG tablet Take 40 mg by mouth at bedtime.       No current facility-administered medications for this visit.   Allergies  Allergen Reactions  . Procardia (Nifedipine)   Past medical history, social history, and family history reviewed and updated.  ROS: He denies chest pain or dyspnea. Colonoscopy performed approximately 2010 and reportedly negative, but chart indicates that polypectomy was performed. Patient has noted weight gain, which she attributes to decreased activity over the winter.  All other systems reviewed and are negative.  PHYSICAL EXAM: BP 136/74  Pulse 82  Ht 5' 4.5" (1.638 m)  Wt 123.378 kg (272 lb)  BMI 45.98 kg/m2  SpO2 97%;  Body mass index is 45.98 kg/(m^2). General-Well developed; no acute distress Body habitus-proportionate weight and height Neck-No JVD; no carotid bruits Lungs-clear lung fields; resonant to percussion Cardiovascular-normal PMI; normal S1 and S2; fourth heart sound present  Abdomen-normal bowel sounds; soft and non-tender without masses or organomegaly Musculoskeletal-No deformities, no cyanosis or clubbing Neurologic-Normal cranial nerves; symmetric strength and tone  Skin-Warm, no significant lesions Extremities-distal pulses intact; no edema  Sylvanite Bing, MD 05/04/2012  2:48 PM  ASSESSMENT AND PLAN

## 2012-05-04 NOTE — Patient Instructions (Addendum)
Your physician recommends that you schedule a follow-up appointment in: as needed  Your physician recommends that you return for lab work in: TODAY (SLIPS GIVEN-CBC, IRON, FERRITIN, TIBC)  YOUR PHYSICIAN RECOMMENDS THAT YOU COMPLETE HEMOCCULT STOOL CARDS, BRING BACK TO OFFICE ONCE COMPLETED

## 2012-05-04 NOTE — Assessment & Plan Note (Signed)
Blood pressure is well-controlled. Current medication will be continued.

## 2012-05-04 NOTE — Assessment & Plan Note (Signed)
No recent chest pain. Doubt that any of her previous symptoms have reflected coronary artery disease. No further testing planned at present.

## 2012-05-11 LAB — CBC
HCT: 35.7 % — ABNORMAL LOW (ref 36.0–46.0)
Hemoglobin: 12.2 g/dL (ref 12.0–15.0)
MCV: 80.4 fL (ref 78.0–100.0)
RBC: 4.44 MIL/uL (ref 3.87–5.11)
WBC: 6.3 10*3/uL (ref 4.0–10.5)

## 2012-05-11 LAB — IRON: Iron: 61 ug/dL (ref 42–145)

## 2012-05-12 ENCOUNTER — Encounter: Payer: Self-pay | Admitting: Cardiology

## 2012-05-12 ENCOUNTER — Encounter: Payer: Self-pay | Admitting: *Deleted

## 2012-05-17 ENCOUNTER — Ambulatory Visit (INDEPENDENT_AMBULATORY_CARE_PROVIDER_SITE_OTHER): Payer: Medicare Other | Admitting: *Deleted

## 2012-05-17 DIAGNOSIS — Z0000 Encounter for general adult medical examination without abnormal findings: Secondary | ICD-10-CM

## 2012-05-19 DIAGNOSIS — Z0000 Encounter for general adult medical examination without abnormal findings: Secondary | ICD-10-CM

## 2012-05-19 LAB — POC HEMOCCULT BLD/STL (HOME/3-CARD/SCREEN)
Card #3 Fecal Occult Blood, POC: NEGATIVE
Fecal Occult Blood, POC: NEGATIVE

## 2012-05-21 ENCOUNTER — Encounter: Payer: Self-pay | Admitting: Cardiology

## 2012-05-24 ENCOUNTER — Encounter: Payer: Self-pay | Admitting: *Deleted

## 2012-06-01 ENCOUNTER — Other Ambulatory Visit: Payer: Self-pay | Admitting: Family Medicine

## 2012-06-15 ENCOUNTER — Encounter (HOSPITAL_COMMUNITY): Payer: Self-pay

## 2012-06-15 ENCOUNTER — Encounter (HOSPITAL_COMMUNITY): Payer: Self-pay | Admitting: Cardiology

## 2012-07-15 ENCOUNTER — Other Ambulatory Visit: Payer: Self-pay | Admitting: Family Medicine

## 2012-08-18 ENCOUNTER — Encounter: Payer: Self-pay | Admitting: Family Medicine

## 2012-08-18 ENCOUNTER — Ambulatory Visit (INDEPENDENT_AMBULATORY_CARE_PROVIDER_SITE_OTHER): Payer: Medicare Other | Admitting: Family Medicine

## 2012-08-18 VITALS — BP 132/82 | Temp 98.7°F | Ht 65.5 in | Wt 277.5 lb

## 2012-08-18 DIAGNOSIS — J209 Acute bronchitis, unspecified: Secondary | ICD-10-CM

## 2012-08-18 DIAGNOSIS — R062 Wheezing: Secondary | ICD-10-CM

## 2012-08-18 MED ORDER — PRAVASTATIN SODIUM 80 MG PO TABS: 80.0000 mg | ORAL_TABLET | Freq: Every day | ORAL | Status: DC

## 2012-08-18 MED ORDER — METFORMIN HCL 500 MG PO TABS: 500.0000 mg | ORAL_TABLET | Freq: Two times a day (BID) | ORAL | Status: DC

## 2012-08-18 MED ORDER — LEVOFLOXACIN 500 MG PO TABS: 500.0000 mg | ORAL_TABLET | Freq: Every day | ORAL | Status: DC

## 2012-08-18 NOTE — Progress Notes (Signed)
  Subjective:    Patient ID: [name], female    DOB: 20-Jun-1955, 57 y.o.   MRN: [id number]  Cough This is a new problem. The current episode started 1 to 4 weeks ago. The problem has been unchanged. The problem occurs constantly. The cough is non-productive. Associated symptoms include a fever (comes and goes), shortness of breath (occasional) and wheezing. Pertinent negatives include no chest pain. Associated symptoms comments: congestion. Nothing aggravates the symptoms. She has tried steroid inhaler for the symptoms. The treatment provided mild relief. Her past medical history is significant for asthma.    History of reactive airway  Review of Systems  Constitutional: Positive for fever (comes and goes) and fatigue.  Respiratory: Positive for cough, shortness of breath (occasional) and wheezing.   Cardiovascular: Negative for chest pain.       Objective:   Physical Exam  Constitutional: She appears well-developed.  HENT:  Head: Normocephalic.  Cardiovascular: Normal rate, regular rhythm and normal heart sounds.   Pulmonary/Chest: Effort normal and breath sounds normal.  Coarse cough noted, no distress  Lymphadenopathy:    She has no cervical adenopathy.  Skin: Skin is warm and dry.          Assessment & Plan:  Mild acute bronchitis probable secondary reactive airway as well should gradually get better albuterol treatments as directed followup if progressive troubles warning signs were discussed in detail. Antibiotics. Albuterol. Levaquin 10 days

## 2012-08-31 ENCOUNTER — Other Ambulatory Visit: Payer: Self-pay | Admitting: Family Medicine

## 2012-09-25 ENCOUNTER — Encounter: Payer: Self-pay | Admitting: *Deleted

## 2012-09-28 ENCOUNTER — Ambulatory Visit (INDEPENDENT_AMBULATORY_CARE_PROVIDER_SITE_OTHER): Payer: Medicare Other | Admitting: Family Medicine

## 2012-09-28 ENCOUNTER — Encounter: Payer: Self-pay | Admitting: Family Medicine

## 2012-09-28 VITALS — BP 140/84 | Ht 65.0 in | Wt 281.2 lb

## 2012-09-28 DIAGNOSIS — I10 Essential (primary) hypertension: Secondary | ICD-10-CM

## 2012-09-28 DIAGNOSIS — Z79899 Other long term (current) drug therapy: Secondary | ICD-10-CM

## 2012-09-28 DIAGNOSIS — Z23 Encounter for immunization: Secondary | ICD-10-CM

## 2012-09-28 NOTE — Progress Notes (Signed)
  Subjective:    Patient ID: [name], female    DOB: 05-Oct-1955, 57 y.o.   MRN: [id number]  Hypertension This is a chronic problem. The current episode started more than 1 year ago. The problem has been gradually improving since onset. The problem is controlled. There are no associated agents to hypertension. There are no known risk factors for coronary artery disease. The current treatment provides moderate improvement. There are no compliance problems.   Patient states she has no concerns today. She has a specialist that manages her diabetes.  Pt now on lipid meds.  Trying to watch diet from multile angles., needs flu shot.  Asthma so far so good.  Uses the inhaler only once per mo on avefage   Review of Systems No chest pain no back pain no change in bowel habits no blood in stool ROS otherwise negative    Objective:   Physical Exam  Alert no acute distress. Lungs clear. Heart regular rate and rhythm. HEENT normal. Ankles without edema.      Assessment & Plan:  Impression #1 hypertension good control. #2 asthma good control. #3 hyperlipidemia status uncertain. Plan flu vaccine today. Appropriate blood work. Diet exercise discussed. Check every 6 months. Followup with diabetes specialist as scheduled. WSL

## 2012-10-01 ENCOUNTER — Other Ambulatory Visit: Payer: Self-pay | Admitting: Family Medicine

## 2012-10-04 DIAGNOSIS — Z96659 Presence of unspecified artificial knee joint: Secondary | ICD-10-CM | POA: Insufficient documentation

## 2012-10-04 DIAGNOSIS — M25669 Stiffness of unspecified knee, not elsewhere classified: Secondary | ICD-10-CM | POA: Insufficient documentation

## 2012-10-06 LAB — BASIC METABOLIC PANEL
CO2: 29 mEq/L (ref 19–32)
Calcium: 9.7 mg/dL (ref 8.4–10.5)
Chloride: 104 mEq/L (ref 96–112)
Creat: 0.96 mg/dL (ref 0.50–1.10)
Glucose, Bld: 141 mg/dL — ABNORMAL HIGH (ref 70–99)
Sodium: 143 mEq/L (ref 135–145)

## 2012-10-06 LAB — LIPID PANEL: LDL Cholesterol: 86 mg/dL (ref 0–99)

## 2012-10-06 LAB — HEPATIC FUNCTION PANEL
AST: 16 U/L (ref 0–37)
Alkaline Phosphatase: 81 U/L (ref 39–117)
Bilirubin, Direct: 0.1 mg/dL (ref 0.0–0.3)
Total Bilirubin: 0.4 mg/dL (ref 0.3–1.2)

## 2012-10-15 ENCOUNTER — Other Ambulatory Visit (HOSPITAL_COMMUNITY): Payer: Self-pay | Admitting: Cardiology

## 2012-11-12 ENCOUNTER — Other Ambulatory Visit: Payer: Self-pay | Admitting: Family Medicine

## 2012-11-24 ENCOUNTER — Other Ambulatory Visit: Payer: Self-pay | Admitting: Family Medicine

## 2012-12-15 ENCOUNTER — Other Ambulatory Visit: Payer: Self-pay | Admitting: Cardiovascular Disease

## 2013-01-03 ENCOUNTER — Other Ambulatory Visit: Payer: Self-pay | Admitting: Family Medicine

## 2013-01-03 DIAGNOSIS — N63 Unspecified lump in unspecified breast: Secondary | ICD-10-CM

## 2013-01-12 ENCOUNTER — Other Ambulatory Visit: Payer: Self-pay | Admitting: *Deleted

## 2013-01-12 ENCOUNTER — Telehealth: Payer: Self-pay | Admitting: Family Medicine

## 2013-01-12 MED ORDER — CLONIDINE HCL 0.2 MG/24HR TD PTWK: 0.2000 mg | MEDICATED_PATCH | TRANSDERMAL | Status: DC

## 2013-01-12 NOTE — Telephone Encounter (Signed)
Med sent to pharm. Pt notified on voicemail.  

## 2013-01-12 NOTE — Telephone Encounter (Signed)
Patient wants [place] to start prescribing her clonidine patch. Her heart doctor has retired.   [name]

## 2013-01-12 NOTE — Telephone Encounter (Signed)
Ok, that's reasonable since now under good control three months of refills to f u in march at 6 mo interval

## 2013-01-13 DIAGNOSIS — C50919 Malignant neoplasm of unspecified site of unspecified female breast: Secondary | ICD-10-CM

## 2013-01-13 HISTORY — DX: Malignant neoplasm of unspecified site of unspecified female breast: C50.919

## 2013-01-19 ENCOUNTER — Ambulatory Visit (HOSPITAL_COMMUNITY)
Admission: RE | Admit: 2013-01-19 | Discharge: 2013-01-19 | Disposition: A | Discharge status: Home / Self Care | Payer: Medicare Other | Source: Ambulatory Visit | Attending: Family Medicine | Admitting: Family Medicine

## 2013-01-19 ENCOUNTER — Other Ambulatory Visit: Payer: Self-pay | Admitting: Family Medicine

## 2013-01-19 DIAGNOSIS — N63 Unspecified lump in unspecified breast: Secondary | ICD-10-CM

## 2013-01-24 ENCOUNTER — Other Ambulatory Visit: Payer: Self-pay | Admitting: Family Medicine

## 2013-01-26 ENCOUNTER — Other Ambulatory Visit: Payer: Self-pay | Admitting: Family Medicine

## 2013-01-26 ENCOUNTER — Other Ambulatory Visit (HOSPITAL_COMMUNITY): Payer: Self-pay | Admitting: Oncology

## 2013-01-26 ENCOUNTER — Ambulatory Visit (HOSPITAL_COMMUNITY)
Admission: RE | Admit: 2013-01-26 | Discharge: 2013-01-26 | Disposition: A | Discharge status: Home / Self Care | Payer: Medicare Other | Source: Ambulatory Visit | Attending: Family Medicine | Admitting: Family Medicine

## 2013-01-26 DIAGNOSIS — IMO0002 Reserved for concepts with insufficient information to code with codable children: Secondary | ICD-10-CM

## 2013-01-26 DIAGNOSIS — C50419 Malignant neoplasm of upper-outer quadrant of unspecified female breast: Secondary | ICD-10-CM | POA: Insufficient documentation

## 2013-01-26 DIAGNOSIS — C773 Secondary and unspecified malignant neoplasm of axilla and upper limb lymph nodes: Secondary | ICD-10-CM | POA: Insufficient documentation

## 2013-01-26 DIAGNOSIS — R229 Localized swelling, mass and lump, unspecified: Principal | ICD-10-CM

## 2013-01-26 DIAGNOSIS — N63 Unspecified lump in unspecified breast: Secondary | ICD-10-CM

## 2013-01-26 MED ORDER — LIDOCAINE HCL (PF) 2 % IJ SOLN
INTRAMUSCULAR | Status: AC
  Administered 2013-01-26: 10 mL
  Filled 2013-01-26: qty 10

## 2013-01-26 MED ORDER — LIDOCAINE HCL (PF) 2 % IJ SOLN
10.0000 mL | Freq: Once | INTRAMUSCULAR | Status: AC
  Administered 2013-01-26: 10 mL

## 2013-01-26 MED ORDER — LIDOCAINE HCL (PF) 1 % IJ SOLN
INTRAMUSCULAR | Status: AC
  Filled 2013-01-26: qty 15

## 2013-01-28 ENCOUNTER — Other Ambulatory Visit: Payer: Self-pay | Admitting: Family Medicine

## 2013-01-28 DIAGNOSIS — C50911 Malignant neoplasm of unspecified site of right female breast: Secondary | ICD-10-CM

## 2013-01-31 ENCOUNTER — Telehealth: Payer: Self-pay | Admitting: *Deleted

## 2013-01-31 DIAGNOSIS — C50411 Malignant neoplasm of upper-outer quadrant of right female breast: Secondary | ICD-10-CM

## 2013-01-31 DIAGNOSIS — Z170 Estrogen receptor positive status [ER+]: Secondary | ICD-10-CM | POA: Insufficient documentation

## 2013-01-31 NOTE — Telephone Encounter (Signed)
Confirmed [facility] for 02/10/12 at 0800 .  Instructions and contact information given.

## 2013-02-02 ENCOUNTER — Telehealth: Payer: Self-pay | Admitting: Family Medicine

## 2013-02-02 MED ORDER — ALBUTEROL SULFATE HFA 108 (90 BASE) MCG/ACT IN AERS: 2.0000 | INHALATION_SPRAY | RESPIRATORY_TRACT | Status: DC | PRN

## 2013-02-02 NOTE — Telephone Encounter (Signed)
Rx sent electronically to pharmacy. Patient notified. 

## 2013-02-02 NOTE — Telephone Encounter (Signed)
Patient needs Rx for albuterol inhaler  Larene Pickett

## 2013-02-07 ENCOUNTER — Ambulatory Visit
Admission: RE | Admit: 2013-02-07 | Discharge: 2013-02-07 | Disposition: A | Discharge status: Home / Self Care | Payer: Medicare Other | Source: Ambulatory Visit | Attending: Family Medicine | Admitting: Family Medicine

## 2013-02-07 DIAGNOSIS — C50911 Malignant neoplasm of unspecified site of right female breast: Secondary | ICD-10-CM

## 2013-02-09 ENCOUNTER — Ambulatory Visit: Payer: Medicare Other

## 2013-02-09 ENCOUNTER — Encounter (INDEPENDENT_AMBULATORY_CARE_PROVIDER_SITE_OTHER): Payer: Self-pay | Admitting: Surgery

## 2013-02-09 ENCOUNTER — Ambulatory Visit: Payer: Medicare Other | Attending: Surgery | Admitting: Physical Therapy

## 2013-02-09 ENCOUNTER — Ambulatory Visit (HOSPITAL_BASED_OUTPATIENT_CLINIC_OR_DEPARTMENT_OTHER): Payer: Medicare Other | Admitting: Surgery

## 2013-02-09 ENCOUNTER — Encounter: Payer: Self-pay | Admitting: Oncology

## 2013-02-09 ENCOUNTER — Other Ambulatory Visit (HOSPITAL_BASED_OUTPATIENT_CLINIC_OR_DEPARTMENT_OTHER): Payer: Medicare Other

## 2013-02-09 ENCOUNTER — Ambulatory Visit (HOSPITAL_BASED_OUTPATIENT_CLINIC_OR_DEPARTMENT_OTHER): Payer: Medicare Other | Admitting: Oncology

## 2013-02-09 ENCOUNTER — Ambulatory Visit
Admission: RE | Admit: 2013-02-09 | Discharge: 2013-02-09 | Disposition: A | Discharge status: Home / Self Care | Payer: Medicare Other | Source: Ambulatory Visit | Attending: Radiation Oncology | Admitting: Radiation Oncology

## 2013-02-09 ENCOUNTER — Encounter (INDEPENDENT_AMBULATORY_CARE_PROVIDER_SITE_OTHER): Payer: Self-pay

## 2013-02-09 VITALS — BP 172/88 | HR 96 | Temp 98.3°F | Resp 19 | Ht 65.0 in | Wt 277.0 lb

## 2013-02-09 DIAGNOSIS — C50919 Malignant neoplasm of unspecified site of unspecified female breast: Secondary | ICD-10-CM

## 2013-02-09 DIAGNOSIS — E0500 Thyrotoxicosis with diffuse goiter without thyrotoxic crisis or storm: Secondary | ICD-10-CM | POA: Insufficient documentation

## 2013-02-09 DIAGNOSIS — I10 Essential (primary) hypertension: Secondary | ICD-10-CM

## 2013-02-09 DIAGNOSIS — C50411 Malignant neoplasm of upper-outer quadrant of right female breast: Secondary | ICD-10-CM

## 2013-02-09 DIAGNOSIS — E119 Type 2 diabetes mellitus without complications: Secondary | ICD-10-CM

## 2013-02-09 DIAGNOSIS — R293 Abnormal posture: Secondary | ICD-10-CM | POA: Insufficient documentation

## 2013-02-09 DIAGNOSIS — Z96659 Presence of unspecified artificial knee joint: Secondary | ICD-10-CM | POA: Insufficient documentation

## 2013-02-09 DIAGNOSIS — R5381 Other malaise: Secondary | ICD-10-CM | POA: Insufficient documentation

## 2013-02-09 DIAGNOSIS — C50419 Malignant neoplasm of upper-outer quadrant of unspecified female breast: Secondary | ICD-10-CM

## 2013-02-09 DIAGNOSIS — R269 Unspecified abnormalities of gait and mobility: Secondary | ICD-10-CM | POA: Insufficient documentation

## 2013-02-09 DIAGNOSIS — IMO0001 Reserved for inherently not codable concepts without codable children: Secondary | ICD-10-CM | POA: Insufficient documentation

## 2013-02-09 DIAGNOSIS — C50911 Malignant neoplasm of unspecified site of right female breast: Secondary | ICD-10-CM

## 2013-02-09 DIAGNOSIS — M25569 Pain in unspecified knee: Secondary | ICD-10-CM | POA: Insufficient documentation

## 2013-02-09 DIAGNOSIS — Z170 Estrogen receptor positive status [ER+]: Secondary | ICD-10-CM

## 2013-02-09 DIAGNOSIS — R5383 Other fatigue: Secondary | ICD-10-CM

## 2013-02-09 DIAGNOSIS — C773 Secondary and unspecified malignant neoplasm of axilla and upper limb lymph nodes: Secondary | ICD-10-CM

## 2013-02-09 LAB — CBC WITH DIFFERENTIAL/PLATELET
BASO%: 0.8 % (ref 0.0–2.0)
Basophils Absolute: 0.1 10*3/uL (ref 0.0–0.1)
EOS%: 3 % (ref 0.0–7.0)
Eosinophils Absolute: 0.2 10*3/uL (ref 0.0–0.5)
HCT: 34.5 % — ABNORMAL LOW (ref 34.8–46.6)
HGB: 11.6 g/dL (ref 11.6–15.9)
LYMPH#: 2 10*3/uL (ref 0.9–3.3)
LYMPH%: 27.2 % (ref 14.0–49.7)
MCH: 27.8 pg (ref 25.1–34.0)
MCHC: 33.5 g/dL (ref 31.5–36.0)
MCV: 82.8 fL (ref 79.5–101.0)
MONO#: 0.5 10*3/uL (ref 0.1–0.9)
MONO%: 6.8 % (ref 0.0–14.0)
NEUT#: 4.5 10*3/uL (ref 1.5–6.5)
NEUT%: 62.2 % (ref 38.4–76.8)
Platelets: 255 10*3/uL (ref 145–400)
RBC: 4.17 10*6/uL (ref 3.70–5.45)
RDW: 16.4 % — ABNORMAL HIGH (ref 11.2–14.5)
WBC: 7.3 10*3/uL (ref 3.9–10.3)

## 2013-02-09 LAB — COMPREHENSIVE METABOLIC PANEL (CC13)
ALT: 10 U/L (ref 0–55)
ANION GAP: 14 meq/L — AB (ref 3–11)
AST: 17 U/L (ref 5–34)
Albumin: 4 g/dL (ref 3.5–5.0)
Alkaline Phosphatase: 83 U/L (ref 40–150)
BUN: 12.5 mg/dL (ref 7.0–26.0)
CHLORIDE: 106 meq/L (ref 98–109)
CO2: 23 meq/L (ref 22–29)
Calcium: 9.5 mg/dL (ref 8.4–10.4)
Creatinine: 0.9 mg/dL (ref 0.6–1.1)
Glucose: 193 mg/dl — ABNORMAL HIGH (ref 70–140)
Potassium: 3.6 mEq/L (ref 3.5–5.1)
Sodium: 143 mEq/L (ref 136–145)
TOTAL PROTEIN: 7.8 g/dL (ref 6.4–8.3)
Total Bilirubin: 0.29 mg/dL (ref 0.20–1.20)

## 2013-02-09 MED ORDER — DEXAMETHASONE 4 MG PO TABS: 8.0000 mg | ORAL_TABLET | Freq: Two times a day (BID) | ORAL | Status: DC

## 2013-02-09 MED ORDER — PROCHLORPERAZINE MALEATE 10 MG PO TABS: 10.0000 mg | ORAL_TABLET | Freq: Four times a day (QID) | ORAL | Status: DC | PRN

## 2013-02-09 MED ORDER — ONDANSETRON HCL 8 MG PO TABS: 8.0000 mg | ORAL_TABLET | Freq: Two times a day (BID) | ORAL | Status: DC

## 2013-02-09 MED ORDER — LORAZEPAM 0.5 MG PO TABS: 0.5000 mg | ORAL_TABLET | Freq: Four times a day (QID) | ORAL | Status: DC | PRN

## 2013-02-09 NOTE — Addendum Note (Signed)
Addended by: Erroll Luna A on: 02/09/2013 11:36 AM   Modules accepted: Orders

## 2013-02-09 NOTE — Progress Notes (Signed)
Checked in new pt with no financial concerns. °

## 2013-02-09 NOTE — Progress Notes (Signed)
Patient ID: [name], female   DOB: 1955/04/14, 58 y.o.   MRN: [id number]  No chief complaint on file.   HPI [name] is a 58 y.o. female.   HPI Pt sent at request of  Mikey Kirschner, MD   For right breast cancer diagnoses by mammogram and biopsy showed 1.8 cm  IDC and positive LN by biopsy.  Pt noted lump for 2 months prior to seeking treatment.  No nipple discharge.    Past Medical History  Diagnosis Date  . Hypertension     Severe; normal coronary angiography in 1995; echocardiogram in 05/2005-EF of 65-70%, moderate LVH; negative stress nuclear in 2007  . Diabetes mellitus, type 2   . Asthma   . Grave's disease     With ophthalmic involvement and hyperthyroidism  . Anemia, iron deficiency     No GI source identified; possibly due to menstrual blood loss  . Osteoarthritis 2003    S/p left TKA In 2003  . Anxiety   . Tobacco abuse, in remission     20 pack years, discontinued 1990  . Hyperlipidemia   . Small bowel obstruction 02/9526    umbilical hernia; diverticulosis  . Obesity   . Graves disease     Past Surgical History  Procedure Laterality Date  . Total knee arthroplasty  2003    Left  . Total thyroidectomy    . Colonoscopy  2007    negative except for diverticulosis  . Tubal ligation  1985    Bilateral  . Colonoscopy w/ polypectomy  2009    Family History  Problem Relation Age of Onset  . Heart disease Father   . Lung cancer Father   . Diabetes Mother     Social History History  Substance Use Topics  . Smoking status: Former Smoker -- 1.00 packs/day for 20 years    Types: Cigarettes    Quit date: 02/10/1991  . Smokeless tobacco: Never Used     Comment: Quit in 1996  . Alcohol Use: No    Allergies  Allergen Reactions  . Procardia [Nifedipine]     Current Outpatient Prescriptions  Medication Sig Dispense Refill  . ACCU-CHEK SMARTVIEW test strip USE ONE STRIP TO CHECK GLUCOSE 4 TIMES DAILY  100 each  5  . acetaminophen (TYLENOL)  500 MG tablet Take 1,000 mg by mouth 3 (three) times daily as needed. For pain      . albuterol (PROVENTIL HFA;VENTOLIN HFA) 108 (90 BASE) MCG/ACT inhaler Inhale 2 puffs into the lungs every 4 (four) hours as needed.  18 g  2  . albuterol (PROVENTIL) (2.5 MG/3ML) 0.083% nebulizer solution Take 2.5 mg by nebulization every 4 (four) hours as needed.        Marland Kitchen amLODipine (NORVASC) 10 MG tablet TAKE ONE TABLET BY MOUTH ONCE DAILY.  90 tablet  1  . Artificial Tear Ointment (ARTIFICIAL TEARS) ointment as needed.        Marland Kitchen aspirin 81 MG tablet Take 81 mg by mouth daily.      . benazepril (LOTENSIN) 40 MG tablet TAKE ONE TABLET BY MOUTH ONCE DAILY.  30 tablet  5  . Biotin 1000 MCG tablet Take 1,000 mcg by mouth daily.      . cloNIDine (CATAPRES - DOSED IN MG/24 HR) 0.2 mg/24hr patch Place 1 patch (0.2 mg total) onto the skin once a week.  4 patch  2  . cycloSPORINE (RESTASIS) 0.05 % ophthalmic emulsion Place 1 drop into  both eyes every 12 (twelve) hours.        Marland Kitchen glipiZIDE (GLUCOTROL XL) 5 MG 24 hr tablet Take 5 mg by mouth every morning.      . hydrochlorothiazide (HYDRODIURIL) 25 MG tablet TAKE ONE TABLET BY MOUTH ONCE DAILY.  90 tablet  1  . labetalol (NORMODYNE) 300 MG tablet TAKE (1) TABLET BY MOUTH TWICE DAILY.  60 tablet  3  . levothyroxine (SYNTHROID, LEVOTHROID) 100 MCG tablet Take 100 mcg by mouth every morning.      . metFORMIN (GLUCOPHAGE) 500 MG tablet Take 1 tablet (500 mg total) by mouth 2 (two) times daily with a meal.  60 tablet  6  . pantoprazole (PROTONIX) 40 MG tablet Take 1 tablet (40 mg total) by mouth once.  30 tablet  0  . pravastatin (PRAVACHOL) 80 MG tablet Take 1 tablet (80 mg total) by mouth at bedtime.  30 tablet  6   No current facility-administered medications for this visit.    Review of Systems Review of Systems  Constitutional: Negative for fever, chills and unexpected weight change.  HENT: Negative for congestion, hearing loss, sore throat, trouble swallowing and  voice change.   Eyes: Negative for visual disturbance.  Respiratory: Negative for cough and wheezing.   Cardiovascular: Negative for chest pain, palpitations and leg swelling.  Gastrointestinal: Negative for nausea, vomiting, abdominal pain, diarrhea, constipation, blood in stool, abdominal distention and anal bleeding.  Genitourinary: Negative for hematuria, vaginal bleeding and difficulty urinating.  Musculoskeletal: Negative for arthralgias.  Skin: Negative for rash and wound.  Neurological: Negative for seizures, syncope and headaches.  Hematological: Negative for adenopathy. Does not bruise/bleed easily.  Psychiatric/Behavioral: Negative for confusion.    There were no vitals taken for this visit.  Physical Exam Physical Exam  Constitutional: She is oriented to person, place, and time. She appears well-developed and well-nourished.  HENT:  Head: Normocephalic.  Eyes: Pupils are equal, round, and reactive to light. No scleral icterus.  Neck: Normal range of motion. Neck supple.  Cardiovascular: Normal rate and regular rhythm.   Pulmonary/Chest: Right breast exhibits mass. Right breast exhibits no inverted nipple, no skin change and no tenderness. Left breast exhibits no inverted nipple, no mass, no nipple discharge, no skin change and no tenderness.    Musculoskeletal: Normal range of motion.  Lymphadenopathy:    She has no cervical adenopathy.    She has axillary adenopathy.       Right axillary: Lateral adenopathy present.  Neurological: She is alert and oriented to person, place, and time.  Skin: Skin is warm and dry.  Psychiatric: She has a normal mood and affect. Her behavior is normal. Judgment and thought content normal.    Data Reviewed CLINICAL DATA: Palpable right breast mass.  EXAM:  DIGITAL DIAGNOSTIC bilateral MAMMOGRAM WITH CAD  ULTRASOUND right BREAST  COMPARISON: None.  ACR Breast Density Category b: There are scattered areas of  fibroglandular density.    FINDINGS:  There is an irregular mass with pleomorphic calcifications located  within the upper outer quadrant of the right breast. On mammography  this measures 1.6 cm in size. Also, there is an abnormal appearing  level 1 low right axillary lymph node present. There are no  additional findings within the right breast.  Calcifications are seen inferiorly within the left breast. On  additional evaluation these are shown to represent dermal  calcifications.  Mammographic images were processed with CAD.  On physical exam, there is a firm, mobile palpable  mass located  within the right breast which measures by PHYSICAL EXAMINATION  approximately 2 cm in size.  Ultrasound is performed, showing an irregularly marginated  hypoechoic mass with an echogenic rim located within the right  breast at the 10 o'clock position 8 cm from the nipple corresponding  to the palpable finding. This measures 1.8 x 1.7 x 1.2 cm in size  and is worrisome for invasive mammary carcinoma. Ultrasound of the  right axilla demonstrates a 1.3 cm abnormal appearing rounded low  axillary (level 1) lymph node with loss of a fatty hilum worrisome  for a metastatic lymph node.  I discussed ultrasound-guided core biopsy of the breast mass and  abnormal appearing right axillary lymph node with the patient. This  has been scheduled for 01/26/2013.  IMPRESSION:  1.8 cm irregular mass worrisome for invasive mammary carcinoma  located within the right breast at the 10 o'clock position 8 cm from  the nipple with an abnormal appearing level 1 right axillary lymph  node.  RECOMMENDATION:  Ultrasound-guided core biopsy of the right breast mass and abnormal  appearing right axillary lymph node.  I have discussed the findings and recommendations with the patient.  Results were also provided in writing at the conclusion of the  visit.  BI-RADS CATEGORY 5: Highly suggestive of malignancy - appropriate  action should be taken.   Electronically Signed  By: Luberta Robertson M.D.  On: 01/19/2013 17:24   Path   1.8 cm right breast outer quadrant  LN pos  Er/pr her 2 neu positive   Assessment    T1c N1 mX right breast cancer Patient Active Problem List   Diagnosis Date Noted  . Breast cancer, right 02/09/2013  . Breast cancer of upper-outer quadrant of right female breast 01/31/2013  . Atypical chest pain 02/12/2012  . Osteoarthritis   . Asthma 12/08/2010  . Degenerative joint disease 12/08/2010  . Obesity   . Anemia, iron deficiency   . Hyperlipidemia 10/16/2010  . Hypertension   . Diabetes mellitus, type 2   . Hyperthyroidism   . Tobacco abuse, in remission       Plan    Right lumpectomy  Right  ALND and port placement discussed. Pt may be eligible for trial study to be discussed by members of cancer center for neoadjuvant chemotherapy and avoid ALND and only have SLN mapping.  The procedure has been discussed with the patient. Alternatives to surgery have been discussed with the patient.  Risks of surgery include bleeding,  Infection,  Seroma formation, death,  and the need for further surgery.   The patient understands and wishes to proceed. ALND risks include bleeding infection  Arm swelling  Pain stiffness and more surgery. Discussed port placement as well.  Risks include bleeding,  pneumothorax  Malfunction organ injury   Death .  She needs time to think things over and decide.        Janelle Culton A. 02/09/2013, 10:21 AM

## 2013-02-09 NOTE — Progress Notes (Signed)
[name] [id number] 1955/05/03 58 y.o. 02/09/2013 12:19 PM  CC Dr. Thea Silversmith Dr. Jeraldine Loots, MD [place] [address]  REASON FOR CONSULTATION:  58 year old female with new diagnosis of T1 N1 (stage II) right breast cancer.  STAGE:   Breast cancer of upper-outer quadrant of right female breast   Primary site: Breast (Right)   Staging method: AJCC 7th Edition   Clinical: Stage IIA (T1c, N1, cM0)   Summary: Stage IIA (T1c, N1, cM0)  REFERRING PHYSICIAN: Dr. Marcello Moores Cornett  HISTORY OF PRESENT ILLNESS:  [name] is a 58 y.o. female.  Who palpated a right breast mass. She had a mammogram that revealed a 1.8 cm lesion in the right breast. There was also noted to be enlarged lymph node by ultrasound. Patient could not tolerate MRI. 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 The biopsied right lymph node was positive for metastatic disease. Patient is now seen in the multidisciplinary breast clinic for discussion of treatment options. Her case was discussed in the breast conference. Pathology and radiology were reviewed. She does have history of right knee replacement and chronic pain. She is accompanied by HER-2 daughters.   Past Medical History: Past Medical History  Diagnosis Date  . Hypertension     Severe; normal coronary angiography in 1995; echocardiogram in 05/2005-EF of 65-70%, moderate LVH; negative stress nuclear in 2007  . Diabetes mellitus, type 2   . Asthma   . Grave's disease     With ophthalmic involvement and hyperthyroidism  . Anemia, iron deficiency     No GI source identified; possibly due to menstrual blood loss  . Osteoarthritis 2003    S/p left TKA In 2003  . Anxiety   . Tobacco abuse, in remission     20 pack years, discontinued 1990  . Hyperlipidemia   . Small bowel obstruction 07/9148    umbilical hernia; diverticulosis  .  Obesity   . Graves disease     Past Surgical History: Past Surgical History  Procedure Laterality Date  . Total knee arthroplasty  2003    Left  . Total thyroidectomy    . Colonoscopy  2007    negative except for diverticulosis  . Tubal ligation  1985    Bilateral  . Colonoscopy w/ polypectomy  2009    Family History: Family History  Problem Relation Age of Onset  . Heart disease Father   . Lung cancer Father   . Diabetes Mother     Social History History  Substance Use Topics  . Smoking status: Former Smoker -- 1.00 packs/day for 20 years    Types: Cigarettes    Quit date: 02/10/1991  . Smokeless tobacco: Never Used     Comment: Quit in 1996  . Alcohol Use: No    Allergies: Allergies  Allergen Reactions  . Procardia [Nifedipine]     Current Medications: Current Outpatient Prescriptions  Medication Sig Dispense Refill  . ACCU-CHEK SMARTVIEW test strip USE ONE STRIP TO CHECK GLUCOSE 4 TIMES DAILY  100 each  5  . acetaminophen (TYLENOL) 500 MG tablet Take 1,000 mg by mouth 3 (three) times daily as needed. For pain      . albuterol (PROVENTIL HFA;VENTOLIN HFA) 108 (90 BASE) MCG/ACT inhaler Inhale 2 puffs into the lungs every 4 (four) hours as needed.  18 g  2  . albuterol (PROVENTIL) (2.5 MG/3ML) 0.083% nebulizer  solution Take 2.5 mg by nebulization every 4 (four) hours as needed.        Marland Kitchen amLODipine (NORVASC) 10 MG tablet TAKE ONE TABLET BY MOUTH ONCE DAILY.  90 tablet  1  . Artificial Tear Ointment (ARTIFICIAL TEARS) ointment as needed.        Marland Kitchen aspirin 81 MG tablet Take 81 mg by mouth daily.      . benazepril (LOTENSIN) 40 MG tablet TAKE ONE TABLET BY MOUTH ONCE DAILY.  30 tablet  5  . Biotin 1000 MCG tablet Take 1,000 mcg by mouth daily.      . cloNIDine (CATAPRES - DOSED IN MG/24 HR) 0.2 mg/24hr patch Place 1 patch (0.2 mg total) onto the skin once a week.  4 patch  2  . cycloSPORINE (RESTASIS) 0.05 % ophthalmic emulsion Place 1 drop into both eyes every 12  (twelve) hours.        Marland Kitchen glipiZIDE (GLUCOTROL XL) 5 MG 24 hr tablet Take 5 mg by mouth every morning.      . hydrochlorothiazide (HYDRODIURIL) 25 MG tablet TAKE ONE TABLET BY MOUTH ONCE DAILY.  90 tablet  1  . labetalol (NORMODYNE) 300 MG tablet TAKE (1) TABLET BY MOUTH TWICE DAILY.  60 tablet  3  . levothyroxine (SYNTHROID, LEVOTHROID) 100 MCG tablet Take 100 mcg by mouth every morning.      . metFORMIN (GLUCOPHAGE) 500 MG tablet Take 1 tablet (500 mg total) by mouth 2 (two) times daily with a meal.  60 tablet  6  . pravastatin (PRAVACHOL) 80 MG tablet Take 1 tablet (80 mg total) by mouth at bedtime.  30 tablet  6  . pantoprazole (PROTONIX) 40 MG tablet Take 1 tablet (40 mg total) by mouth once.  30 tablet  0   No current facility-administered medications for this visit.    OB/GYN History: Menarche at age 50 she underwent menopause in 2009 no use of hormone replacement therapy. First live birth at 68.  Fertility Discussion: Not applicable Prior History of Cancer: No  Health Maintenance:  Colonoscopy 2007 Bone Density 2005 Last PAP smear 1984  ECOG PERFORMANCE STATUS: 1 - Symptomatic but completely ambulatory  Genetic Counseling/testing: no  REVIEW OF SYSTEMS:  A comprehensive 14 point review of systems was obtained and it is scant separately into the electronic medical record  PHYSICAL EXAMINATION: Blood pressure 172/88, pulse 96, temperature 98.3 F (36.8 C), temperature source Oral, resp. rate 19, height 5' 5"  (1.651 m), weight 277 lb (125.646 kg).  General:  well-nourished in no acute distress.  Eyes:  no scleral icterus.  ENT:  There were no oropharyngeal lesions.  Neck was without thyromegaly.  Lymphatics:  Negative cervical, supraclavicular or axillary adenopathy.  Respiratory: lungs were clear bilaterally without wheezing or crackles.  Cardiovascular:  Regular rate and rhythm, S1/S2, without murmur, rub or gallop.  There was no pedal edema.  GI:  abdomen was soft, flat,  nontender, nondistended, without organomegaly.  Muscoloskeletal:  no spinal tenderness of palpation of vertebral spine.  Skin exam was without echymosis, petichae.  Neuro exam was nonfocal.  Patient was able to get on and off exam table without assistance.  Gait was normal.  Patient was alerted and oriented.  Attention was good.   Language was appropriate.  Mood was normal without depression.  Speech was not pressured.  Thought content was not tangential.   Breasts: Left breast appear normal, no suspicious masses, no skin or nipple changes or axillary nodes, abnormal mass palpable  in the right breast in the upper outer quadrant.    STUDIES/RESULTS: [place] Core Biopsy  February 06, 2013   ADDENDUM REPORT: 02/06/13 13:42  ADDENDUM: Pathology results of a right breast biopsy 10 o'clock position demonstrate invasive ductal carcinoma. Pathology of a right axillary lymph node demonstrates metastatic carcinoma. Pathology results of both biopsies are concordant with imaging findings.  I discussed pathology results with the patient today. She states that she would like to be referred to [facility] for treatment. She reports no significant problems after biopsies.  She has been scheduled for the Multidisciplinary Breast Clinic at [facility] on 02/09/2013.  Bilateral breast MRI is suggested and will be scheduled for the patient.   Electronically Signed   By: Curlene Dolphin M.D.   On: 02/06/13 13:42   06-Feb-2013   CLINICAL DATA:  Suspicious right axillary lymph node. Biopsy is recommended.  EXAM: ULTRASOUND GUIDED CORE NEEDLE BIOPSY OF A RIGHT AXILLARY NODE  COMPARISON:  Previous exams.  FINDINGS: I met with the patient and we discussed the procedure of ultrasound-guided biopsy, including benefits and alternatives. We discussed the high likelihood of a successful procedure. We discussed the risks of the procedure, including infection, bleeding, tissue injury, clip migration, and inadequate sampling. Informed written consent  was given. The usual time-out protocol was performed immediately prior to the procedure.  Using sterile technique and 2% Lidocaine as local anesthetic, under direct ultrasound visualization, a 9 gauge vacuum assisted device was used to perform biopsy of a suspicious right axillary lymph node using a lateral to medial approach.  IMPRESSION: Ultrasound guided biopsy of right axillary lymph node. No apparent complications.  Electronically Signed: By: Curlene Dolphin M.D. On: 02-06-13 13:38   Mm Digital Diagnostic Bilat  01/19/2013   CLINICAL DATA:  Palpable right breast mass.  EXAM: DIGITAL DIAGNOSTIC  bilateral MAMMOGRAM WITH CAD  ULTRASOUND right BREAST  COMPARISON:  None.  ACR Breast Density Category b: There are scattered areas of fibroglandular density.  FINDINGS: There is an irregular mass with pleomorphic calcifications located within the upper outer quadrant of the right breast. On mammography this measures 1.6 cm in size. Also, there is an abnormal appearing level 1 low right axillary lymph node present. There are no additional findings within the right breast.  Calcifications are seen inferiorly within the left breast. On additional evaluation these are shown to represent dermal calcifications.  Mammographic images were processed with CAD.  On physical exam, there is a firm, mobile palpable mass located within the right breast which measures by PHYSICAL EXAMINATION approximately 2 cm in size.  Ultrasound is performed, showing an irregularly marginated hypoechoic mass with an echogenic rim located within the right breast at the 10 o'clock position 8 cm from the nipple corresponding to the palpable finding. This measures 1.8 x 1.7 x 1.2 cm in size and is worrisome for invasive mammary carcinoma. Ultrasound of the right axilla demonstrates a 1.3 cm abnormal appearing rounded low axillary (level 1) lymph node with loss of a fatty hilum worrisome for a metastatic lymph node.  I discussed ultrasound-guided core  biopsy of the breast mass and abnormal appearing right axillary lymph node with the patient. This has been scheduled for 01/26/2013.  IMPRESSION: 1.8 cm irregular mass worrisome for invasive mammary carcinoma located within the right breast at the 10 o'clock position 8 cm from the nipple with an abnormal appearing level 1 right axillary lymph node.  RECOMMENDATION: Ultrasound-guided core biopsy of the right breast mass and abnormal appearing right axillary lymph  node.  I have discussed the findings and recommendations with the patient. Results were also provided in writing at the conclusion of the visit.  BI-RADS CATEGORY  5: Highly suggestive of malignancy - appropriate action should be taken.   Electronically Signed   By: Luberta Robertson M.D.   On: 01/19/2013 17:24   Mm Digital Diagnostic Unilat R  01/26/2013   CLINICAL DATA:  Biopsy was performed of a palpable mass in the 10 o'clock position of the right breast.  EXAM: POST-BIOPSY CLIP PLACEMENT RIGHT DIAGNOSTIC MAMMOGRAM  COMPARISON:  Previous exams.  FINDINGS: Films are performed following ultrasound guided biopsy of suspicious palpable right breast mass at 10 o'clock position. A ribbon-shaped biopsy clip is satisfactorily positioned within the mass.  IMPRESSION: Satisfactory position of biopsy clip in the right breast mass.  Final Assessment: Post Procedure Mammograms for Marker Placement   Electronically Signed   By: Curlene Dolphin M.D.   On: 01/26/2013 16:44   [place] Rt Breast Bx W Loc Dev 1st Lesion Img Bx Spec US Guide  01/28/2013   ADDENDUM REPORT: 01/28/2013 13:42  ADDENDUM: Pathology results of a right breast biopsy 10 o'clock position demonstrate invasive ductal carcinoma. Pathology of a right axillary lymph node demonstrates metastatic carcinoma. Pathology results of both biopsies are concordant with imaging findings.  I discussed pathology results with the patient today. She states that she would like to be referred to [facility] for treatment. She  reports no significant problems after biopsies.  She has been scheduled for the Multidisciplinary Breast Clinic at [facility] on 02/09/2013.  Bilateral breast MRI is suggested and will be scheduled for the patient.   Electronically Signed   By: Curlene Dolphin M.D.   On: 01/28/2013 13:42   01/28/2013   CLINICAL DATA:  Suspicious mass 10 o'clock position right breast, palpable. Biopsy is recommended.  EXAM: ULTRASOUND GUIDED RIGHT BREAST CORE NEEDLE BIOPSY WITH VACUUM ASSIST  COMPARISON:  Previous exams.  PROCEDURE: I met with the patient and we discussed the procedure of ultrasound-guided biopsy, including benefits and alternatives. We discussed the high likelihood of a successful procedure. We discussed the risks of the procedure including infection, bleeding, tissue injury, clip migration, and inadequate sampling. Informed written consent was given. The usual time-out protocol was performed immediately prior to the procedure.  Using sterile technique and 2% Lidocaine as local anesthetic, under direct ultrasound visualization, a 12 gauge vacuum-assisteddevice was used to perform biopsy of palpable suspicious appearing mass 10 o'clock position right breast using a lateral to medial approach. At the conclusion of the procedure, a tissue marker clip was deployed into the biopsy cavity. Follow-up 2-view mammogram was performed and dictated separately.  IMPRESSION: Ultrasound-guided biopsy of right breast mass. No apparent complications.  Electronically Signed: By: Curlene Dolphin M.D. On: 01/26/2013 16:53   [place] Rt Breast Bx W Loc Dev Ea Add Lesion Img Bx Spec US Guide  01/28/2013   ADDENDUM REPORT: 01/28/2013 13:42  ADDENDUM: Pathology results of a right breast biopsy 10 o'clock position demonstrate invasive ductal carcinoma. Pathology of a right axillary lymph node demonstrates metastatic carcinoma. Pathology results of both biopsies are concordant with imaging findings.  I discussed pathology results with the patient  today. She states that she would like to be referred to [facility] for treatment. She reports no significant problems after biopsies.  She has been scheduled for the Multidisciplinary Breast Clinic at [facility] on 02/09/2013.  Bilateral breast MRI is suggested and will be scheduled for the patient.  Electronically Signed   By: Curlene Dolphin M.D.   On: 01/28/2013 13:42   01/28/2013   CLINICAL DATA:  Suspicious mass 10 o'clock position right breast, palpable. Biopsy is recommended.  EXAM: ULTRASOUND GUIDED RIGHT BREAST CORE NEEDLE BIOPSY WITH VACUUM ASSIST  COMPARISON:  Previous exams.  PROCEDURE: I met with the patient and we discussed the procedure of ultrasound-guided biopsy, including benefits and alternatives. We discussed the high likelihood of a successful procedure. We discussed the risks of the procedure including infection, bleeding, tissue injury, clip migration, and inadequate sampling. Informed written consent was given. The usual time-out protocol was performed immediately prior to the procedure.  Using sterile technique and 2% Lidocaine as local anesthetic, under direct ultrasound visualization, a 12 gauge vacuum-assisteddevice was used to perform biopsy of palpable suspicious appearing mass 10 o'clock position right breast using a lateral to medial approach. At the conclusion of the procedure, a tissue marker clip was deployed into the biopsy cavity. Follow-up 2-view mammogram was performed and dictated separately.  IMPRESSION: Ultrasound-guided biopsy of right breast mass. No apparent complications.  Electronically Signed: By: Curlene Dolphin M.D. On: 01/26/2013 16:53     LABS:    Chemistry      Component Value Date/Time   NA 143 02/09/2013 0839   NA 143 10/06/2012 0830   K 3.6 02/09/2013 0839   K 4.2 10/06/2012 0830   CL 104 10/06/2012 0830   CO2 23 02/09/2013 0839   CO2 29 10/06/2012 0830   BUN 12.5 02/09/2013 0839   BUN 17 10/06/2012 0830   CREATININE 0.9 02/09/2013 0839   CREATININE  0.96 10/06/2012 0830   CREATININE 0.96 02/06/2012 2030      Component Value Date/Time   CALCIUM 9.5 02/09/2013 0839   CALCIUM 9.7 10/06/2012 0830   ALKPHOS 83 02/09/2013 0839   ALKPHOS 81 10/06/2012 0830   AST 17 02/09/2013 0839   AST 16 10/06/2012 0830   ALT 10 02/09/2013 0839   ALT 9 10/06/2012 0830   BILITOT 0.29 02/09/2013 0839   BILITOT 0.4 10/06/2012 0830      Lab Results  Component Value Date   WBC 7.3 02/09/2013   HGB 11.6 02/09/2013   HCT 34.5* 02/09/2013   MCV 82.8 02/09/2013   PLT 255 02/09/2013     ASSESSMENT/PLAN    58 year old female with  #1 new diagnosis of stage II a (T1 C. N1) invasive ductal carcinoma that was ER positive PR positive HER-2/neu positive with a positive axillary lymph node. On ultrasound the mass measured 1.8 cm. MRI could not be obtained do to intolerance of the prone position.We spent the better part of today's hour-long appointment discussing the biology of breast cancer in general, and the specifics of the patient's tumor in particular.  #2 patient desires breast conservation. We discussed neoadjuvant treatment with Taxotere carboplatinum Herceptin/perjeta. I do think she would be a good candidate for this. We discussed rationale for this risks benefits and side effects of this treatment. Patient consented to starting this.  #3 patient will need staging scans due to the high-risk nature of her disease. I have recommended PET CT.  #4 For chemotherapy she will need a Port-A-Cath placement, chemotherapy teaching class, echocardiogram, and I will also send her to cardiology for ongoing surveillance of her ejection fraction.  #5 patient will be seen back after her port is placed and we will begin chemotherapy.  Clinical Trial Eligibility: yes Multidisciplinary conference discussion yes   Discussion: Patient is being treated per NCCN  breast cancer care guidelines appropriate for stage.II   Thank you so much for allowing me to participate in the care of  KALISE FICKETT. I will continue to follow up the patient with you and assist in her care.  All questions were answered. The patient knows to call the clinic with any problems, questions or concerns. We can certainly see the patient much sooner if necessary.  I spent 55 minutes counseling the patient face to face. The total time spent in the appointment was 60 minutes.  Lacey Panning, MD Medical/Oncology [facility] [phone] (beeper) [phone] (Office)  02/09/2013, 12:19 PM

## 2013-02-09 NOTE — Patient Instructions (Signed)
Swollen Lymph Nodes The lymphatic system filters fluid from around cells. It is like a system of blood vessels. These channels carry lymph instead of blood. The lymphatic system is an important part of the immune (disease fighting) system. When people talk about "swollen glands in the neck," they are usually talking about swollen lymph nodes. The lymph nodes are like the little traps for infection. You and your caregiver may be able to feel lymph nodes, especially swollen nodes, in these common areas: the groin (inguinal area), armpits (axilla), and above the clavicle (supraclavicular). You may also feel them in the neck (cervical) and the back of the head just above the hairline (occipital). Swollen glands occur when there is any condition in which the body responds with an allergic type of reaction. For instance, the glands in the neck can become swollen from insect bites or any type of minor infection on the head. These are very noticeable in children with only minor problems. Lymph nodes may also become swollen when there is a tumor or problem with the lymphatic system, such as Hodgkin's disease. TREATMENT   Most swollen glands do not require treatment. They can be observed (watched) for a short period of time, if your caregiver feels it is necessary. Most of the time, observation is not necessary.  Antibiotics (medicines that kill germs) may be prescribed by your caregiver. Your caregiver may prescribe these if he or she feels the swollen glands are due to a bacterial (germ) infection. Antibiotics are not used if the swollen glands are caused by a virus. HOME CARE INSTRUCTIONS   Take medications as directed by your caregiver. Only take over-the-counter or prescription medicines for pain, discomfort, or fever as directed by your caregiver. SEEK MEDICAL CARE IF:   If you begin to run a temperature greater than 102 F (38.9 C), or as your caregiver suggests. MAKE SURE YOU:   Understand these  instructions.  Will watch your condition.  Will get help right away if you are not doing well or get worse. Document Released: 12/20/2001 Document Revised: 03/24/2011 Document Reviewed: 12/30/2004 [facility] Patient Information 2014 [place]. Implanted Port Insertion An implanted port is a central line that has a round shape and is placed under the skin. It is used as a long-term IV access for:   Medicines, such as chemotherapy.   Fluids.   Liquid nutrition, such as total parenteral nutrition (TPN).   Blood samples.  LET [facility] CARE PROVIDER KNOW ABOUT:  Allergies to food or medicine.   Medicines taken, including vitamins, herbs, eye drops, creams, and over-the-counter medicines.   Any allergies to heparin.  Use of steroids (by mouth or creams).   Previous problems with anesthetics or numbing medicines.   History of bleeding problems or blood clots.   Previous surgery.   Other health problems, including diabetes and kidney problems.   Possibility of pregnancy, if this applies. RISKS AND COMPLICATIONS  Damage to the blood vessel, bruising, or bleeding at the puncture site.   Infection.  Blood clot in the vessel that the port is in.  Breakdown of the skin over your port.  Very rarely a person may develop a condition called a pneumothorax, a collection of air in the chest that may cause one of the lungs to collapse. The placement of these catheters with the appropriate imaging guidance significantly decreases the risk of a pneumothorax.  BEFORE THE PROCEDURE   Your health care provider may want you to have blood tests. These tests  can help tell how well your kidneys and liver are working. They can also show how well your blood clots.   If you take blood thinners (anticoagulant medicines), ask your health care provider when you should stop taking them.   Make arrangements for someone to drive you home. This is necessary if you have been  sedated for your procedure.  PROCEDURE  Port insertion usually takes about 30 45 minutes.   An IV needle will be inserted in your arm. Medicine for pain and medicine to help relax you (sedative) will flow directly into your body through this needle.   You will lie on an exam table, and you will be connected to monitors to keep track of your heart rate, blood pressure, and breathing throughout the procedure.  An oxygen monitoring device may be attached to your finger. Oxygen will be given.   Everything is kept as germ free (sterile) as possible during the procedure. The skin near the point of the incision will be cleaned with antiseptic, and the area will be draped with sterile towels. The skin and deeper tissues over the port area will be made numb with a local anesthetic.  Two small cuts (incisions) will be made in the skin to insert the port. One will be made in the neck to obtain access to the vein where the catheter will lie.   Because the port reservoir is placed under the skin, a small skin incision is made in the upper chest, and a small pocket for the port is made under the skin. The catheter connected to the port tunnels to a large central vein in the chest. A small, raised area remains on your body at the site of the reservoir when the procedure is complete.  The port placement is done under imaging guidance to ensure the proper placement.  The reservoir has a silicone covering that can be punctured with a special needle.   The port is flushed with normal saline, and blood is drawn to make sure it is working properly.  There is nothing remaining outside the skin when the procedure is finished.   Incisions are held together by stitches, surgical glue, or a special tape.  AFTER THE PROCEDURE  You will stay in a recovery area until the anesthesia has worn off. Your blood pressure and pulse will be checked.  A final chest X-ray is taken to check the placement of the port  and that there is no injury to your lung.  If there are no problems, you should be able to go home after the procedure.  Document Released: 10/20/2012 Document Reviewed: 09/06/2012 [facility] Patient Information 2014 [place], [place].

## 2013-02-09 NOTE — Progress Notes (Signed)
Radiation Oncology         [id number]) [phone] ________________________________  Initial outpatient Consultation - Date: 02/09/2013   Name: [name] MRN: [id number]   DOB: Feb 14, 1955  REFERRING PHYSICIAN: Turner Daniels., MD  STAGE: Breast cancer of upper-outer quadrant of right female breast   Primary site: Breast (Right)   Staging method: AJCC 7th Edition   Clinical: Stage IIA (T1c, N1, cM0)   Summary: Stage IIA (T1c, N1, cM0)   Clinical comments: Staged at breast conference 02/09/13  HISTORY OF PRESENT ILLNESS::[name] is a 58 y.o. female  palpated a right breast will mass. Mammogram showed a 1.8 cm in lesion. This was confirmed by ultrasound. An enlarged lymph node was also noted and was biopsied. Patient did not tolerate the prone position for MRI so this was not performed.  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 The axillary lymph node was also positive. She has had some soreness since biopsy. She has chronic right knee pain after a knee replacement surgery. She is accompanied by her daughters today. She is interested in breast conservation. She is Conner P2 with her first live birth at 72. Her menstrual period was at 11. She has postmenopausal and had her last period 2009. She has not been on hormone replacement therapy.  PREVIOUS RADIATION THERAPY: No  PAST MEDICAL HISTORY:  has a past medical history of Hypertension; Diabetes mellitus, type 2; Asthma; Grave's disease; Anemia, iron deficiency; Osteoarthritis (2003); Anxiety; Tobacco abuse, in remission; Hyperlipidemia; Small bowel obstruction (02/2008); Obesity; and Graves disease.    PAST SURGICAL HISTORY: Past Surgical History  Procedure Laterality Date  . Total knee arthroplasty  2003    Left  . Total thyroidectomy    . Colonoscopy  2007    negative except for diverticulosis  . Tubal ligation  1985    Bilateral  . Colonoscopy w/ polypectomy  2009    FAMILY  HISTORY:  Family History  Problem Relation Age of Onset  . Heart disease Father   . Lung cancer Father   . Diabetes Mother     SOCIAL HISTORY:  History  Substance Use Topics  . Smoking status: Former Smoker -- 1.00 packs/day for 20 years    Types: Cigarettes    Quit date: 02/10/1991  . Smokeless tobacco: Never Used     Comment: Quit in 1996  . Alcohol Use: No    ALLERGIES: Procardia  MEDICATIONS:  Current Outpatient Prescriptions  Medication Sig Dispense Refill  . ACCU-CHEK SMARTVIEW test strip USE ONE STRIP TO CHECK GLUCOSE 4 TIMES DAILY  100 each  5  . acetaminophen (TYLENOL) 500 MG tablet Take 1,000 mg by mouth 3 (three) times daily as needed. For pain      . albuterol (PROVENTIL HFA;VENTOLIN HFA) 108 (90 BASE) MCG/ACT inhaler Inhale 2 puffs into the lungs every 4 (four) hours as needed.  18 g  2  . albuterol (PROVENTIL) (2.5 MG/3ML) 0.083% nebulizer solution Take 2.5 mg by nebulization every 4 (four) hours as needed.        Marland Kitchen amLODipine (NORVASC) 10 MG tablet TAKE ONE TABLET BY MOUTH ONCE DAILY.  90 tablet  1  . Artificial Tear Ointment (ARTIFICIAL TEARS) ointment as needed.        Marland Kitchen aspirin 81 MG tablet Take 81 mg by mouth daily.      . benazepril (LOTENSIN) 40 MG tablet TAKE ONE TABLET BY MOUTH ONCE DAILY.  [place]  tablet  5  . Biotin 1000 MCG tablet Take 1,000 mcg by mouth daily.      . cloNIDine (CATAPRES - DOSED IN MG/24 HR) 0.2 mg/24hr patch Place 1 patch (0.2 mg total) onto the skin once a week.  4 patch  2  . cycloSPORINE (RESTASIS) 0.05 % ophthalmic emulsion Place 1 drop into both eyes every 12 (twelve) hours.        Marland Kitchen glipiZIDE (GLUCOTROL XL) 5 MG 24 hr tablet Take 5 mg by mouth every morning.      . hydrochlorothiazide (HYDRODIURIL) 25 MG tablet TAKE ONE TABLET BY MOUTH ONCE DAILY.  90 tablet  1  . labetalol (NORMODYNE) 300 MG tablet TAKE (1) TABLET BY MOUTH TWICE DAILY.  60 tablet  3  . levothyroxine (SYNTHROID, LEVOTHROID) 100 MCG tablet Take 100 mcg by mouth every  morning.      . metFORMIN (GLUCOPHAGE) 500 MG tablet Take 1 tablet (500 mg total) by mouth 2 (two) times daily with a meal.  60 tablet  6  . pantoprazole (PROTONIX) 40 MG tablet Take 1 tablet (40 mg total) by mouth once.  30 tablet  0  . pravastatin (PRAVACHOL) 80 MG tablet Take 1 tablet (80 mg total) by mouth at bedtime.  30 tablet  6   No current facility-administered medications for this encounter.    REVIEW OF SYSTEMS:  A 15 point review of systems is documented in the electronic medical record. This was obtained by the nursing staff. However, I reviewed this with the patient to discuss relevant findings and make appropriate changes.  Pertinent items are noted in HPI.  PHYSICAL EXAM: There were no vitals filed for this visit.. . Female in no distress sitting comfortably examining table. She has 5 out of 5 strength bilaterally. She has a palpable mass in the upper outer quadrant of the right breast. She has no palpable masses of the left breast. Cranial nurse 3 through 12 are tested and intact. He is alert and oriented x3.  LABORATORY DATA:  Lab Results  Component Value Date   WBC 7.3 02/09/2013   HGB 11.6 02/09/2013   HCT 34.5* 02/09/2013   MCV 82.8 02/09/2013   PLT 255 02/09/2013   Lab Results  Component Value Date   NA 143 02/09/2013   K 3.6 02/09/2013   CL 104 10/06/2012   CO2 23 02/09/2013   Lab Results  Component Value Date   ALT 10 02/09/2013   AST 17 02/09/2013   ALKPHOS 83 02/09/2013   BILITOT 0.29 02/09/2013     RADIOGRAPHY: [place] Core Biopsy  01/28/2013   ADDENDUM REPORT: 01/28/2013 13:42  ADDENDUM: Pathology results of a right breast biopsy 10 o'clock position demonstrate invasive ductal carcinoma. Pathology of a right axillary lymph node demonstrates metastatic carcinoma. Pathology results of both biopsies are concordant with imaging findings.  I discussed pathology results with the patient today. She states that she would like to be referred to [facility] for treatment. She  reports no significant problems after biopsies.  She has been scheduled for the Multidisciplinary Breast Clinic at [facility] on 02/09/2013.  Bilateral breast MRI is suggested and will be scheduled for the patient.   Electronically Signed   By: Curlene Dolphin M.D.   On: 01/28/2013 13:42   01/28/2013   CLINICAL DATA:  Suspicious right axillary lymph node. Biopsy is recommended.  EXAM: ULTRASOUND GUIDED CORE NEEDLE BIOPSY OF A RIGHT AXILLARY NODE  COMPARISON:  Previous exams.  FINDINGS: I met with  the patient and we discussed the procedure of ultrasound-guided biopsy, including benefits and alternatives. We discussed the high likelihood of a successful procedure. We discussed the risks of the procedure, including infection, bleeding, tissue injury, clip migration, and inadequate sampling. Informed written consent was given. The usual time-out protocol was performed immediately prior to the procedure.  Using sterile technique and 2% Lidocaine as local anesthetic, under direct ultrasound visualization, a 9 gauge vacuum assisted device was used to perform biopsy of a suspicious right axillary lymph node using a lateral to medial approach.  IMPRESSION: Ultrasound guided biopsy of right axillary lymph node. No apparent complications.  Electronically Signed: By: Curlene Dolphin M.D. On: 01/28/2013 13:38   Mm Digital Diagnostic Bilat  01/19/2013   CLINICAL DATA:  Palpable right breast mass.  EXAM: DIGITAL DIAGNOSTIC  bilateral MAMMOGRAM WITH CAD  ULTRASOUND right BREAST  COMPARISON:  None.  ACR Breast Density Category b: There are scattered areas of fibroglandular density.  FINDINGS: There is an irregular mass with pleomorphic calcifications located within the upper outer quadrant of the right breast. On mammography this measures 1.6 cm in size. Also, there is an abnormal appearing level 1 low right axillary lymph node present. There are no additional findings within the right breast.  Calcifications are seen inferiorly  within the left breast. On additional evaluation these are shown to represent dermal calcifications.  Mammographic images were processed with CAD.  On physical exam, there is a firm, mobile palpable mass located within the right breast which measures by PHYSICAL EXAMINATION approximately 2 cm in size.  Ultrasound is performed, showing an irregularly marginated hypoechoic mass with an echogenic rim located within the right breast at the 10 o'clock position 8 cm from the nipple corresponding to the palpable finding. This measures 1.8 x 1.7 x 1.2 cm in size and is worrisome for invasive mammary carcinoma. Ultrasound of the right axilla demonstrates a 1.3 cm abnormal appearing rounded low axillary (level 1) lymph node with loss of a fatty hilum worrisome for a metastatic lymph node.  I discussed ultrasound-guided core biopsy of the breast mass and abnormal appearing right axillary lymph node with the patient. This has been scheduled for 01/26/2013.  IMPRESSION: 1.8 cm irregular mass worrisome for invasive mammary carcinoma located within the right breast at the 10 o'clock position 8 cm from the nipple with an abnormal appearing level 1 right axillary lymph node.  RECOMMENDATION: Ultrasound-guided core biopsy of the right breast mass and abnormal appearing right axillary lymph node.  I have discussed the findings and recommendations with the patient. Results were also provided in writing at the conclusion of the visit.  BI-RADS CATEGORY  5: Highly suggestive of malignancy - appropriate action should be taken.   Electronically Signed   By: Luberta Robertson M.D.   On: 01/19/2013 17:24   Mm Digital Diagnostic Unilat R  01/26/2013   CLINICAL DATA:  Biopsy was performed of a palpable mass in the 10 o'clock position of the right breast.  EXAM: POST-BIOPSY CLIP PLACEMENT RIGHT DIAGNOSTIC MAMMOGRAM  COMPARISON:  Previous exams.  FINDINGS: Films are performed following ultrasound guided biopsy of suspicious palpable right breast  mass at 10 o'clock position. A ribbon-shaped biopsy clip is satisfactorily positioned within the mass.  IMPRESSION: Satisfactory position of biopsy clip in the right breast mass.  Final Assessment: Post Procedure Mammograms for Marker Placement   Electronically Signed   By: Curlene Dolphin M.D.   On: 01/26/2013 16:44   [place] Rt Breast Bx W  Loc Dev 1st Lesion Img Bx Spec US Guide  01/28/2013   ADDENDUM REPORT: 01/28/2013 13:42  ADDENDUM: Pathology results of a right breast biopsy 10 o'clock position demonstrate invasive ductal carcinoma. Pathology of a right axillary lymph node demonstrates metastatic carcinoma. Pathology results of both biopsies are concordant with imaging findings.  I discussed pathology results with the patient today. She states that she would like to be referred to [facility] for treatment. She reports no significant problems after biopsies.  She has been scheduled for the Multidisciplinary Breast Clinic at [facility] on 02/09/2013.  Bilateral breast MRI is suggested and will be scheduled for the patient.   Electronically Signed   By: Curlene Dolphin M.D.   On: 01/28/2013 13:42   01/28/2013   CLINICAL DATA:  Suspicious mass 10 o'clock position right breast, palpable. Biopsy is recommended.  EXAM: ULTRASOUND GUIDED RIGHT BREAST CORE NEEDLE BIOPSY WITH VACUUM ASSIST  COMPARISON:  Previous exams.  PROCEDURE: I met with the patient and we discussed the procedure of ultrasound-guided biopsy, including benefits and alternatives. We discussed the high likelihood of a successful procedure. We discussed the risks of the procedure including infection, bleeding, tissue injury, clip migration, and inadequate sampling. Informed written consent was given. The usual time-out protocol was performed immediately prior to the procedure.  Using sterile technique and 2% Lidocaine as local anesthetic, under direct ultrasound visualization, a 12 gauge vacuum-assisteddevice was used to perform biopsy of palpable  suspicious appearing mass 10 o'clock position right breast using a lateral to medial approach. At the conclusion of the procedure, a tissue marker clip was deployed into the biopsy cavity. Follow-up 2-view mammogram was performed and dictated separately.  IMPRESSION: Ultrasound-guided biopsy of right breast mass. No apparent complications.  Electronically Signed: By: Curlene Dolphin M.D. On: 01/26/2013 16:53   [place] Rt Breast Bx W Loc Dev Ea Add Lesion Img Bx Spec US Guide  01/28/2013   ADDENDUM REPORT: 01/28/2013 13:42  ADDENDUM: Pathology results of a right breast biopsy 10 o'clock position demonstrate invasive ductal carcinoma. Pathology of a right axillary lymph node demonstrates metastatic carcinoma. Pathology results of both biopsies are concordant with imaging findings.  I discussed pathology results with the patient today. She states that she would like to be referred to [facility] for treatment. She reports no significant problems after biopsies.  She has been scheduled for the Multidisciplinary Breast Clinic at [facility] on 02/09/2013.  Bilateral breast MRI is suggested and will be scheduled for the patient.   Electronically Signed   By: Curlene Dolphin M.D.   On: 01/28/2013 13:42   01/28/2013   CLINICAL DATA:  Suspicious mass 10 o'clock position right breast, palpable. Biopsy is recommended.  EXAM: ULTRASOUND GUIDED RIGHT BREAST CORE NEEDLE BIOPSY WITH VACUUM ASSIST  COMPARISON:  Previous exams.  PROCEDURE: I met with the patient and we discussed the procedure of ultrasound-guided biopsy, including benefits and alternatives. We discussed the high likelihood of a successful procedure. We discussed the risks of the procedure including infection, bleeding, tissue injury, clip migration, and inadequate sampling. Informed written consent was given. The usual time-out protocol was performed immediately prior to the procedure.  Using sterile technique and 2% Lidocaine as local anesthetic, under direct ultrasound  visualization, a 12 gauge vacuum-assisteddevice was used to perform biopsy of palpable suspicious appearing mass 10 o'clock position right breast using a lateral to medial approach. At the conclusion of the procedure, a tissue marker clip was deployed into the biopsy cavity. Follow-up 2-view mammogram was  performed and dictated separately.  IMPRESSION: Ultrasound-guided biopsy of right breast mass. No apparent complications.  Electronically Signed: By: Curlene Dolphin M.D. On: 01/26/2013 16:53      IMPRESSION: T1 C. N1 invasive ductal carcinoma of the right breast  PLAN: I spoke to the patient and her daughters today regarding her diagnosis. He discussed the equivalency in terms of survival between mastectomy and lumpectomy. We discussed that if she underwent neoadjuvant chemotherapy if possible she could enroll in a clinical trial which would allow her the opportunity to be randomized to no axillary node dissection which would be our standard recommendation at this point. We discussed the role of radiation and decreasing local failures and also the effectiveness of radiation in controlling microscopic disease. He discussed 6 weeks of treatment as an outpatient. We discussed the most common side effects are fatigue and skin redness and darkening. She has met with surgery and medical oncology. I will plan on meeting with her in the middle of her neoadjuvant chemotherapy did discuss studies in further detail. She is a surgery medical oncology. She is physical therapy and in a member of our patient family support team. I spent 40 minutes  face to face with the patient and more than 50% of that time was spent in counseling and/or coordination of care.   ------------------------------------------------  Thea Silversmith, MD

## 2013-02-09 NOTE — Patient Instructions (Signed)
TCH/perjeta  Chemo class  Staging scans  cardiology referral

## 2013-02-10 ENCOUNTER — Telehealth: Payer: Self-pay | Admitting: Oncology

## 2013-02-10 ENCOUNTER — Telehealth: Payer: Self-pay | Admitting: *Deleted

## 2013-02-10 NOTE — Telephone Encounter (Signed)
Per staff message and POF I have scheduled appts. Books closed on 2/10, left message advising MD and scheduler. JMW

## 2013-02-10 NOTE — Telephone Encounter (Signed)
Per staff message from MD ok to move appt from 2/10 to 2/11

## 2013-02-10 NOTE — Telephone Encounter (Signed)
lmonvm advising the pt of her appts on 02/22/2013 along with the [facility]. Sent michelle a staff message to add the chemo appts.

## 2013-02-11 ENCOUNTER — Other Ambulatory Visit: Payer: Self-pay | Admitting: Emergency Medicine

## 2013-02-11 ENCOUNTER — Encounter (HOSPITAL_BASED_OUTPATIENT_CLINIC_OR_DEPARTMENT_OTHER): Payer: Self-pay | Admitting: *Deleted

## 2013-02-11 MED ORDER — LIDOCAINE-PRILOCAINE 2.5-2.5 % EX CREA: 1.0000 "application " | TOPICAL_CREAM | CUTANEOUS | Status: DC | PRN

## 2013-02-11 NOTE — Pre-Procedure Instructions (Addendum)
To come for EKG, CXR; had CMET, CBC, diff 02/09/2013

## 2013-02-11 NOTE — Pre-Procedure Instructions (Signed)
Stress test and 2012 echo reviewed by Dr. Linna Caprice; pt. OK to come for surgery.

## 2013-02-14 ENCOUNTER — Encounter: Payer: Self-pay | Admitting: *Deleted

## 2013-02-14 ENCOUNTER — Telehealth: Payer: Self-pay | Admitting: *Deleted

## 2013-02-14 NOTE — Progress Notes (Signed)
[place] Psychosocial Distress Screening Clinical Social Work  Patient completed distress screening protocol, and patient scored a 6 on the Psychosocial Distress Thermometer which indicates moderate distress. Clinical Social Worker met with pt in [facility] to assess for distress and other psychosocial needs.  Pt stated she was doing "better" and her level of distress had decreased after meeting with the multidisciplinary team and getting more information on her treatment plan.  CSW informed pt of the support team and support services at [facility], and pt was agreeable to an [employer] referral.  CSW encouraged pt to call with any questions or concerns.   Johnnye Lana, MSW, [place] Worker [facility] [phone]

## 2013-02-14 NOTE — Telephone Encounter (Signed)
Faxed Care Plan to PCP and took to Med Rec to scan.  

## 2013-02-15 ENCOUNTER — Other Ambulatory Visit: Payer: Self-pay | Admitting: *Deleted

## 2013-02-15 ENCOUNTER — Other Ambulatory Visit: Payer: Medicare Other

## 2013-02-15 ENCOUNTER — Telehealth: Payer: Self-pay | Admitting: *Deleted

## 2013-02-15 ENCOUNTER — Telehealth: Payer: Self-pay | Admitting: Oncology

## 2013-02-15 ENCOUNTER — Encounter: Payer: Self-pay | Admitting: *Deleted

## 2013-02-15 ENCOUNTER — Encounter (HOSPITAL_BASED_OUTPATIENT_CLINIC_OR_DEPARTMENT_OTHER)
Admission: RE | Admit: 2013-02-15 | Discharge: 2013-02-15 | Disposition: A | Discharge status: Home / Self Care | Payer: Medicare Other | Source: Ambulatory Visit | Attending: Surgery | Admitting: Surgery

## 2013-02-15 ENCOUNTER — Ambulatory Visit
Admission: RE | Admit: 2013-02-15 | Discharge: 2013-02-15 | Disposition: A | Discharge status: Home / Self Care | Payer: Medicare Other | Source: Ambulatory Visit | Attending: Surgery | Admitting: Surgery

## 2013-02-15 LAB — CBC WITH DIFFERENTIAL/PLATELET
BASOS ABS: 0 10*3/uL (ref 0.0–0.1)
Basophils Relative: 0 % (ref 0–1)
EOS PCT: 4 % (ref 0–5)
Eosinophils Absolute: 0.2 10*3/uL (ref 0.0–0.7)
HCT: 35.7 % — ABNORMAL LOW (ref 36.0–46.0)
Hemoglobin: 12 g/dL (ref 12.0–15.0)
LYMPHS PCT: 34 % (ref 12–46)
Lymphs Abs: 2.2 10*3/uL (ref 0.7–4.0)
MCH: 27.3 pg (ref 26.0–34.0)
MCHC: 33.6 g/dL (ref 30.0–36.0)
MCV: 81.3 fL (ref 78.0–100.0)
MONO ABS: 0.5 10*3/uL (ref 0.1–1.0)
Monocytes Relative: 7 % (ref 3–12)
Neutro Abs: 3.5 10*3/uL (ref 1.7–7.7)
Neutrophils Relative %: 55 % (ref 43–77)
Platelets: 260 10*3/uL (ref 150–400)
RBC: 4.39 MIL/uL (ref 3.87–5.11)
RDW: 15.9 % — ABNORMAL HIGH (ref 11.5–15.5)
WBC: 6.3 10*3/uL (ref 4.0–10.5)

## 2013-02-15 LAB — COMPREHENSIVE METABOLIC PANEL
ALT: 11 U/L (ref 0–35)
AST: 18 U/L (ref 0–37)
Albumin: 4.2 g/dL (ref 3.5–5.2)
Alkaline Phosphatase: 90 U/L (ref 39–117)
BILIRUBIN TOTAL: 0.3 mg/dL (ref 0.3–1.2)
BUN: 11 mg/dL (ref 6–23)
CO2: 26 meq/L (ref 19–32)
CREATININE: 0.76 mg/dL (ref 0.50–1.10)
Calcium: 9.4 mg/dL (ref 8.4–10.5)
Chloride: 98 mEq/L (ref 96–112)
Glucose, Bld: 127 mg/dL — ABNORMAL HIGH (ref 70–99)
Potassium: 4.4 mEq/L (ref 3.7–5.3)
Sodium: 141 mEq/L (ref 137–147)
Total Protein: 8.3 g/dL (ref 6.0–8.3)

## 2013-02-15 NOTE — Telephone Encounter (Signed)
Left vm for pt to return call regarding [facility] from 02/09/13. 

## 2013-02-15 NOTE — Telephone Encounter (Signed)
Spoke to pt concerning Landa from 02/09/13.  Pt denies questions or concerns regarding dx or treatment care plan.  Confirmed future appts and port placement.  Encourage pt to call with needs.  Received verbal understanding.  Contact information given.

## 2013-02-17 ENCOUNTER — Ambulatory Visit (HOSPITAL_BASED_OUTPATIENT_CLINIC_OR_DEPARTMENT_OTHER)
Admission: RE | Admit: 2013-02-17 | Discharge: 2013-02-17 | Disposition: A | Discharge status: Home / Self Care | Payer: Medicare Other | Source: Ambulatory Visit | Attending: Surgery | Admitting: Surgery

## 2013-02-17 ENCOUNTER — Ambulatory Visit (HOSPITAL_COMMUNITY): Payer: Medicare Other

## 2013-02-17 ENCOUNTER — Encounter (HOSPITAL_BASED_OUTPATIENT_CLINIC_OR_DEPARTMENT_OTHER): Payer: Medicare Other | Admitting: Anesthesiology

## 2013-02-17 ENCOUNTER — Encounter (HOSPITAL_BASED_OUTPATIENT_CLINIC_OR_DEPARTMENT_OTHER)
Admission: RE | Disposition: A | Discharge status: Home / Self Care | Payer: Self-pay | Source: Ambulatory Visit | Attending: Surgery

## 2013-02-17 ENCOUNTER — Ambulatory Visit (HOSPITAL_BASED_OUTPATIENT_CLINIC_OR_DEPARTMENT_OTHER): Payer: Medicare Other | Admitting: Anesthesiology

## 2013-02-17 ENCOUNTER — Encounter (HOSPITAL_BASED_OUTPATIENT_CLINIC_OR_DEPARTMENT_OTHER): Payer: Self-pay | Admitting: Anesthesiology

## 2013-02-17 DIAGNOSIS — Z87891 Personal history of nicotine dependence: Secondary | ICD-10-CM | POA: Insufficient documentation

## 2013-02-17 DIAGNOSIS — E119 Type 2 diabetes mellitus without complications: Secondary | ICD-10-CM | POA: Insufficient documentation

## 2013-02-17 DIAGNOSIS — C50411 Malignant neoplasm of upper-outer quadrant of right female breast: Secondary | ICD-10-CM

## 2013-02-17 DIAGNOSIS — Z452 Encounter for adjustment and management of vascular access device: Secondary | ICD-10-CM | POA: Insufficient documentation

## 2013-02-17 DIAGNOSIS — E785 Hyperlipidemia, unspecified: Secondary | ICD-10-CM | POA: Insufficient documentation

## 2013-02-17 DIAGNOSIS — J45909 Unspecified asthma, uncomplicated: Secondary | ICD-10-CM | POA: Insufficient documentation

## 2013-02-17 DIAGNOSIS — I10 Essential (primary) hypertension: Secondary | ICD-10-CM | POA: Insufficient documentation

## 2013-02-17 DIAGNOSIS — E0590 Thyrotoxicosis, unspecified without thyrotoxic crisis or storm: Secondary | ICD-10-CM | POA: Insufficient documentation

## 2013-02-17 DIAGNOSIS — C50919 Malignant neoplasm of unspecified site of unspecified female breast: Secondary | ICD-10-CM

## 2013-02-17 DIAGNOSIS — C50419 Malignant neoplasm of upper-outer quadrant of unspecified female breast: Secondary | ICD-10-CM | POA: Insufficient documentation

## 2013-02-17 HISTORY — DX: Thyrotoxicosis with diffuse goiter without thyrotoxic crisis or storm: E05.00

## 2013-02-17 HISTORY — DX: Malignant neoplasm of unspecified site of unspecified female breast: C50.919

## 2013-02-17 HISTORY — PX: PORTACATH PLACEMENT: SHX2246

## 2013-02-17 HISTORY — DX: Type 2 diabetes mellitus without complications: E11.9

## 2013-02-17 LAB — GLUCOSE, CAPILLARY
Glucose-Capillary: 129 mg/dL — ABNORMAL HIGH (ref 70–99)
Glucose-Capillary: 133 mg/dL — ABNORMAL HIGH (ref 70–99)

## 2013-02-17 SURGERY — INSERTION, TUNNELED CENTRAL VENOUS DEVICE, WITH PORT
Anesthesia: General | Site: Neck | Laterality: Right

## 2013-02-17 MED ORDER — HEPARIN (PORCINE) IN NACL 2-0.9 UNIT/ML-% IJ SOLN
INTRAMUSCULAR | Status: DC | PRN
  Administered 2013-02-17: 500 mL via INTRAVENOUS

## 2013-02-17 MED ORDER — MIDAZOLAM HCL 2 MG/ML PO SYRP: 12.0000 mg | ORAL_SOLUTION | Freq: Once | ORAL | Status: DC | PRN

## 2013-02-17 MED ORDER — MIDAZOLAM HCL 5 MG/5ML IJ SOLN
INTRAMUSCULAR | Status: DC | PRN
  Administered 2013-02-17: 1 mg via INTRAVENOUS

## 2013-02-17 MED ORDER — HEPARIN (PORCINE) IN NACL 2-0.9 UNIT/ML-% IJ SOLN
INTRAMUSCULAR | Status: AC
  Filled 2013-02-17: qty 500

## 2013-02-17 MED ORDER — MIDAZOLAM HCL 2 MG/2ML IJ SOLN
INTRAMUSCULAR | Status: AC
  Filled 2013-02-17: qty 2

## 2013-02-17 MED ORDER — OXYCODONE HCL 5 MG/5ML PO SOLN: 5.0000 mg | Freq: Once | ORAL | Status: AC | PRN

## 2013-02-17 MED ORDER — FENTANYL CITRATE 0.05 MG/ML IJ SOLN
INTRAMUSCULAR | Status: AC
  Filled 2013-02-17: qty 4

## 2013-02-17 MED ORDER — LACTATED RINGERS IV SOLN
INTRAVENOUS | Status: DC
  Administered 2013-02-17: 07:00:00 via INTRAVENOUS

## 2013-02-17 MED ORDER — HEPARIN SOD (PORK) LOCK FLUSH 100 UNIT/ML IV SOLN
INTRAVENOUS | Status: AC
  Filled 2013-02-17: qty 5

## 2013-02-17 MED ORDER — CHLORHEXIDINE GLUCONATE 4 % EX LIQD: 1.0000 "application " | Freq: Once | CUTANEOUS | Status: DC

## 2013-02-17 MED ORDER — CEFAZOLIN SODIUM-DEXTROSE 2-3 GM-% IV SOLR
INTRAVENOUS | Status: AC
  Filled 2013-02-17: qty 100

## 2013-02-17 MED ORDER — DEXTROSE 5 % IV SOLN
3.0000 g | INTRAVENOUS | Status: AC
  Administered 2013-02-17: 3 g via INTRAVENOUS

## 2013-02-17 MED ORDER — GLYCOPYRROLATE 0.2 MG/ML IJ SOLN
INTRAMUSCULAR | Status: DC | PRN
  Administered 2013-02-17: 0.2 mg via INTRAVENOUS

## 2013-02-17 MED ORDER — DEXAMETHASONE SODIUM PHOSPHATE 4 MG/ML IJ SOLN
INTRAMUSCULAR | Status: DC | PRN
  Administered 2013-02-17: 10 mg via INTRAVENOUS

## 2013-02-17 MED ORDER — FENTANYL CITRATE 0.05 MG/ML IJ SOLN
INTRAMUSCULAR | Status: DC | PRN
  Administered 2013-02-17: 25 ug via INTRAVENOUS
  Administered 2013-02-17: 75 ug via INTRAVENOUS

## 2013-02-17 MED ORDER — FENTANYL CITRATE 0.05 MG/ML IJ SOLN: 50.0000 ug | INTRAMUSCULAR | Status: DC | PRN

## 2013-02-17 MED ORDER — MIDAZOLAM HCL 2 MG/2ML IJ SOLN: 1.0000 mg | INTRAMUSCULAR | Status: DC | PRN

## 2013-02-17 MED ORDER — PROPOFOL 10 MG/ML IV BOLUS
INTRAVENOUS | Status: DC | PRN
  Administered 2013-02-17: 150 mg via INTRAVENOUS

## 2013-02-17 MED ORDER — HYDROMORPHONE HCL PF 1 MG/ML IJ SOLN: 0.2500 mg | INTRAMUSCULAR | Status: DC | PRN

## 2013-02-17 MED ORDER — OXYCODONE HCL 5 MG PO TABS
5.0000 mg | ORAL_TABLET | Freq: Once | ORAL | Status: AC | PRN
  Administered 2013-02-17: 5 mg via ORAL
  Filled 2013-02-17: qty 1

## 2013-02-17 MED ORDER — OXYCODONE-ACETAMINOPHEN 5-325 MG PO TABS: 1.0000 | ORAL_TABLET | ORAL | Status: DC | PRN

## 2013-02-17 MED ORDER — HEPARIN SOD (PORK) LOCK FLUSH 100 UNIT/ML IV SOLN
INTRAVENOUS | Status: DC | PRN
  Administered 2013-02-17: 500 [IU] via INTRAVENOUS

## 2013-02-17 MED ORDER — LIDOCAINE HCL (CARDIAC) 20 MG/ML IV SOLN
INTRAVENOUS | Status: DC | PRN
  Administered 2013-02-17: 60 mg via INTRAVENOUS

## 2013-02-17 MED ORDER — BUPIVACAINE-EPINEPHRINE PF 0.25-1:200000 % IJ SOLN
INTRAMUSCULAR | Status: AC
  Filled 2013-02-17: qty 30

## 2013-02-17 MED ORDER — ONDANSETRON HCL 4 MG/2ML IJ SOLN
INTRAMUSCULAR | Status: DC | PRN
  Administered 2013-02-17: 4 mg via INTRAVENOUS

## 2013-02-17 MED ORDER — BUPIVACAINE-EPINEPHRINE 0.25% -1:200000 IJ SOLN
INTRAMUSCULAR | Status: DC | PRN
  Administered 2013-02-17: 20 mL

## 2013-02-17 SURGICAL SUPPLY — 59 items
ADH SKN CLS APL DERMABOND .7 (GAUZE/BANDAGES/DRESSINGS) ×1
APL SKNCLS STERI-STRIP NONHPOA (GAUZE/BANDAGES/DRESSINGS)
BAG DECANTER FOR FLEXI CONT (MISCELLANEOUS) ×2 IMPLANT
BENZOIN TINCTURE PRP APPL 2/3 (GAUZE/BANDAGES/DRESSINGS) IMPLANT
BLADE SURG 11 STRL SS (BLADE) ×2 IMPLANT
BLADE SURG 15 STRL LF DISP TIS (BLADE) ×1 IMPLANT
BLADE SURG 15 STRL SS (BLADE) ×2
CANISTER SUCT 1200ML W/VALVE (MISCELLANEOUS) IMPLANT
CHLORAPREP W/TINT 26ML (MISCELLANEOUS) ×2 IMPLANT
CLEANER CAUTERY TIP 5X5 PAD (MISCELLANEOUS) ×1 IMPLANT
COVER MAYO STAND STRL (DRAPES) ×2 IMPLANT
COVER TABLE BACK 60X90 (DRAPES) ×2 IMPLANT
DECANTER SPIKE VIAL GLASS SM (MISCELLANEOUS) IMPLANT
DERMABOND ADVANCED (GAUZE/BANDAGES/DRESSINGS) ×1
DERMABOND ADVANCED .7 DNX12 (GAUZE/BANDAGES/DRESSINGS) ×1 IMPLANT
DRAPE C-ARM 42X72 X-RAY (DRAPES) ×2 IMPLANT
DRAPE LAPAROSCOPIC ABDOMINAL (DRAPES) ×2 IMPLANT
DRAPE UTILITY XL STRL (DRAPES) ×2 IMPLANT
DRSG TEGADERM 2-3/8X2-3/4 SM (GAUZE/BANDAGES/DRESSINGS) IMPLANT
ELECT REM PT RETURN 9FT ADLT (ELECTROSURGICAL) ×2
ELECTRODE REM PT RTRN 9FT ADLT (ELECTROSURGICAL) ×1 IMPLANT
GLOVE BIOGEL PI IND STRL 8 (GLOVE) ×1 IMPLANT
GLOVE BIOGEL PI INDICATOR 8 (GLOVE) ×1
GLOVE ECLIPSE 8.0 STRL XLNG CF (GLOVE) ×2 IMPLANT
GLOVE SURG SS PI 7.0 STRL IVOR (GLOVE) ×1 IMPLANT
GOWN STRL REUS W/ TWL LRG LVL3 (GOWN DISPOSABLE) ×2 IMPLANT
GOWN STRL REUS W/TWL LRG LVL3 (GOWN DISPOSABLE) ×4
IV KIT MINILOC 20X1 SAFETY (NEEDLE) IMPLANT
KIT PORT POWER 8FR ISP CVUE (Catheter) ×1 IMPLANT
KIT PROBE COVER (MISCELLANEOUS) ×1 IMPLANT
NDL HYPO 25X1 1.5 SAFETY (NEEDLE) ×1 IMPLANT
NDL SAFETY ECLIPSE 18X1.5 (NEEDLE) IMPLANT
NDL SPNL 22GX3.5 QUINCKE BK (NEEDLE) IMPLANT
NEEDLE HYPO 18GX1.5 SHARP (NEEDLE)
NEEDLE HYPO 22GX1.5 SAFETY (NEEDLE) IMPLANT
NEEDLE HYPO 25X1 1.5 SAFETY (NEEDLE) ×2 IMPLANT
NEEDLE SPNL 22GX3.5 QUINCKE BK (NEEDLE) IMPLANT
PACK BASIN DAY SURGERY FS (CUSTOM PROCEDURE TRAY) ×2 IMPLANT
PAD CLEANER CAUTERY TIP 5X5 (MISCELLANEOUS) ×1
PENCIL BUTTON HOLSTER BLD 10FT (ELECTRODE) ×2 IMPLANT
SET SHEATH INTRODUCER 10FR (MISCELLANEOUS) IMPLANT
SHEATH COOK PEEL AWAY SET 9F (SHEATH) IMPLANT
SLEEVE SCD COMPRESS KNEE MED (MISCELLANEOUS) IMPLANT
SPONGE GAUZE 4X4 12PLY STER LF (GAUZE/BANDAGES/DRESSINGS) IMPLANT
SPONGE LAP 4X18 X RAY DECT (DISPOSABLE) IMPLANT
STRIP CLOSURE SKIN 1/2X4 (GAUZE/BANDAGES/DRESSINGS) IMPLANT
SUT MON AB 4-0 PC3 18 (SUTURE) ×2 IMPLANT
SUT PROLENE 2 0 CT2 30 (SUTURE) IMPLANT
SUT PROLENE 2 0 SH DA (SUTURE) ×2 IMPLANT
SUT SILK 2 0 TIES 17X18 (SUTURE)
SUT SILK 2-0 18XBRD TIE BLK (SUTURE) IMPLANT
SUT VIC AB 3-0 SH 27 (SUTURE) ×2
SUT VIC AB 3-0 SH 27X BRD (SUTURE) ×1 IMPLANT
SYR 5ML LUER SLIP (SYRINGE) ×2 IMPLANT
SYR CONTROL 10ML LL (SYRINGE) ×2 IMPLANT
TOWEL OR 17X24 6PK STRL BLUE (TOWEL DISPOSABLE) ×3 IMPLANT
TOWEL OR NON WOVEN STRL DISP B (DISPOSABLE) ×1 IMPLANT
TUBE CONNECTING 20X1/4 (TUBING) IMPLANT
YANKAUER SUCT BULB TIP NO VENT (SUCTIONS) IMPLANT

## 2013-02-17 NOTE — Brief Op Note (Signed)
02/17/2013  8:45 AM  PATIENT:  [name]  58 y.o. female  PRE-OPERATIVE DIAGNOSIS:  BREAST CANCER   POST-OPERATIVE DIAGNOSIS:  BREAST CANCER   PROCEDURE:  Procedure(s): INSERTION PORT-A-CATH (Right)  SURGEON:  Surgeon(s) and Role:    * Dynesha Woolen A. Anajulia Leyendecker, MD - Primary     ANESTHESIA:   general  EBL:  Total I/O In: 1000 [I.V.:1000] Out: -   BLOOD ADMINISTERED:none  DRAINS: none   LOCAL MEDICATIONS USED:  BUPIVICAINE   SPECIMEN:  No Specimen  DISPOSITION OF SPECIMEN:  N/A  COUNTS:  YES  TOURNIQUET:  * No tourniquets in log *  DICTATION: .Other Dictation: Dictation Number [id number]  PLAN OF CARE: Discharge to home after PACU  PATIENT DISPOSITION:  PACU - hemodynamically stable.   Delay start of Pharmacological VTE agent (>24hrs) due to surgical blood loss or risk of bleeding: not applicable

## 2013-02-17 NOTE — Discharge Instructions (Signed)
°  Post Anesthesia Home Care Instructions  Activity: Get plenty of rest for the remainder of the day. A responsible adult should stay with you for 24 hours following the procedure.  For the next 24 hours, DO NOT: -Drive a car -Paediatric nurse -Drink alcoholic beverages -Take any medication unless instructed by your physician -Make any legal decisions or sign important papers.  Meals: Start with liquid foods such as gelatin or soup. Progress to regular foods as tolerated. Avoid greasy, spicy, heavy foods. If nausea and/or vomiting occur, drink only clear liquids until the nausea and/or vomiting subsides. Call your physician if vomiting continues.  Special Instructions/Symptoms: Your throat may feel dry or sore from the anesthesia or the breathing tube placed in your throat during surgery. If this causes discomfort, gargle with warm salt water. The discomfort should disappear within 24 hours. Implanted Port Insertion, Care After Refer to this sheet in the next few weeks. These instructions provide you with information on caring for yourself after your procedure. Your health care provider may also give you more specific instructions. Your treatment has been planned according to current medical practices, but problems sometimes occur. Call your health care provider if you have any problems or questions after your procedure. WHAT TO EXPECT AFTER THE PROCEDURE After your procedure, it is typical to have the following:   Discomfort at the port insertion site. Ice packs to the area will help.  Bruising on the skin over the port. This will subside in 3 4 days. HOME CARE INSTRUCTIONS  After your port is placed, you will get a manufacturer's information card. The card has information about your port. Keep this card with you at all times.   Know what kind of port you have. There are many types of ports available.   Wear a medical alert bracelet in case of an emergency. This can help alert health  care workers that you have a port.   The port can stay in for as long as your health care provider believes it is necessary.   A home health care nurse may give medicines and take care of the port.   You or a family member can get special training and directions for giving medicine and taking care of the port at home.  SEEK MEDICAL CARE IF:  Your port does not flush or you are unable to get a blood return.   SEEK IMMEDIATE MEDICAL CARE IF:  You have new fluid or pus coming from your incision.   You notice a bad smell coming from your incision site.   You have swelling, pain, or more redness at the incision or port site.   You have a fever or chills.   You have chest pain or shortness of breath. Document Released: 10/20/2012 Document Reviewed: 09/06/2012 [facility] Patient Information 2014 [place], [place].

## 2013-02-17 NOTE — Anesthesia Postprocedure Evaluation (Signed)
  Anesthesia Post-op Note  Patient: [name]  Procedure(s) Performed: Procedure(s): INSERTION PORT-A-CATH (Right)  Patient Location: PACU  Anesthesia Type:General  Level of Consciousness: awake and alert   Airway and Oxygen Therapy: Patient Spontanous Breathing  Post-op Pain: none  Post-op Assessment: Post-op Vital signs reviewed, Patient's Cardiovascular Status Stable and Respiratory Function Stable  Post-op Vital Signs: Reviewed  Filed Vitals:   02/17/13 0930  BP: 155/70  Pulse: 65  Temp:   Resp: 14    Complications: No apparent anesthesia complications

## 2013-02-17 NOTE — H&P (View-Only) (Signed)
Patient ID: [name], female   DOB: 1955/04/14, 58 y.o.   MRN: [id number]  No chief complaint on file.   HPI [name] is a 58 y.o. female.   HPI Pt sent at request of  Mikey Kirschner, MD   For right breast cancer diagnoses by mammogram and biopsy showed 1.8 cm  IDC and positive LN by biopsy.  Pt noted lump for 2 months prior to seeking treatment.  No nipple discharge.    Past Medical History  Diagnosis Date  . Hypertension     Severe; normal coronary angiography in 1995; echocardiogram in 05/2005-EF of 65-70%, moderate LVH; negative stress nuclear in 2007  . Diabetes mellitus, type 2   . Asthma   . Grave's disease     With ophthalmic involvement and hyperthyroidism  . Anemia, iron deficiency     No GI source identified; possibly due to menstrual blood loss  . Osteoarthritis 2003    S/p left TKA In 2003  . Anxiety   . Tobacco abuse, in remission     20 pack years, discontinued 1990  . Hyperlipidemia   . Small bowel obstruction 02/9526    umbilical hernia; diverticulosis  . Obesity   . Graves disease     Past Surgical History  Procedure Laterality Date  . Total knee arthroplasty  2003    Left  . Total thyroidectomy    . Colonoscopy  2007    negative except for diverticulosis  . Tubal ligation  1985    Bilateral  . Colonoscopy w/ polypectomy  2009    Family History  Problem Relation Age of Onset  . Heart disease Father   . Lung cancer Father   . Diabetes Mother     Social History History  Substance Use Topics  . Smoking status: Former Smoker -- 1.00 packs/day for 20 years    Types: Cigarettes    Quit date: 02/10/1991  . Smokeless tobacco: Never Used     Comment: Quit in 1996  . Alcohol Use: No    Allergies  Allergen Reactions  . Procardia [Nifedipine]     Current Outpatient Prescriptions  Medication Sig Dispense Refill  . ACCU-CHEK SMARTVIEW test strip USE ONE STRIP TO CHECK GLUCOSE 4 TIMES DAILY  100 each  5  . acetaminophen (TYLENOL)  500 MG tablet Take 1,000 mg by mouth 3 (three) times daily as needed. For pain      . albuterol (PROVENTIL HFA;VENTOLIN HFA) 108 (90 BASE) MCG/ACT inhaler Inhale 2 puffs into the lungs every 4 (four) hours as needed.  18 g  2  . albuterol (PROVENTIL) (2.5 MG/3ML) 0.083% nebulizer solution Take 2.5 mg by nebulization every 4 (four) hours as needed.        Marland Kitchen amLODipine (NORVASC) 10 MG tablet TAKE ONE TABLET BY MOUTH ONCE DAILY.  90 tablet  1  . Artificial Tear Ointment (ARTIFICIAL TEARS) ointment as needed.        Marland Kitchen aspirin 81 MG tablet Take 81 mg by mouth daily.      . benazepril (LOTENSIN) 40 MG tablet TAKE ONE TABLET BY MOUTH ONCE DAILY.  30 tablet  5  . Biotin 1000 MCG tablet Take 1,000 mcg by mouth daily.      . cloNIDine (CATAPRES - DOSED IN MG/24 HR) 0.2 mg/24hr patch Place 1 patch (0.2 mg total) onto the skin once a week.  4 patch  2  . cycloSPORINE (RESTASIS) 0.05 % ophthalmic emulsion Place 1 drop into  both eyes every 12 (twelve) hours.        Marland Kitchen glipiZIDE (GLUCOTROL XL) 5 MG 24 hr tablet Take 5 mg by mouth every morning.      . hydrochlorothiazide (HYDRODIURIL) 25 MG tablet TAKE ONE TABLET BY MOUTH ONCE DAILY.  90 tablet  1  . labetalol (NORMODYNE) 300 MG tablet TAKE (1) TABLET BY MOUTH TWICE DAILY.  60 tablet  3  . levothyroxine (SYNTHROID, LEVOTHROID) 100 MCG tablet Take 100 mcg by mouth every morning.      . metFORMIN (GLUCOPHAGE) 500 MG tablet Take 1 tablet (500 mg total) by mouth 2 (two) times daily with a meal.  60 tablet  6  . pantoprazole (PROTONIX) 40 MG tablet Take 1 tablet (40 mg total) by mouth once.  30 tablet  0  . pravastatin (PRAVACHOL) 80 MG tablet Take 1 tablet (80 mg total) by mouth at bedtime.  30 tablet  6   No current facility-administered medications for this visit.    Review of Systems Review of Systems  Constitutional: Negative for fever, chills and unexpected weight change.  HENT: Negative for congestion, hearing loss, sore throat, trouble swallowing and  voice change.   Eyes: Negative for visual disturbance.  Respiratory: Negative for cough and wheezing.   Cardiovascular: Negative for chest pain, palpitations and leg swelling.  Gastrointestinal: Negative for nausea, vomiting, abdominal pain, diarrhea, constipation, blood in stool, abdominal distention and anal bleeding.  Genitourinary: Negative for hematuria, vaginal bleeding and difficulty urinating.  Musculoskeletal: Negative for arthralgias.  Skin: Negative for rash and wound.  Neurological: Negative for seizures, syncope and headaches.  Hematological: Negative for adenopathy. Does not bruise/bleed easily.  Psychiatric/Behavioral: Negative for confusion.    There were no vitals taken for this visit.  Physical Exam Physical Exam  Constitutional: She is oriented to person, place, and time. She appears well-developed and well-nourished.  HENT:  Head: Normocephalic.  Eyes: Pupils are equal, round, and reactive to light. No scleral icterus.  Neck: Normal range of motion. Neck supple.  Cardiovascular: Normal rate and regular rhythm.   Pulmonary/Chest: Right breast exhibits mass. Right breast exhibits no inverted nipple, no skin change and no tenderness. Left breast exhibits no inverted nipple, no mass, no nipple discharge, no skin change and no tenderness.    Musculoskeletal: Normal range of motion.  Lymphadenopathy:    She has no cervical adenopathy.    She has axillary adenopathy.       Right axillary: Lateral adenopathy present.  Neurological: She is alert and oriented to person, place, and time.  Skin: Skin is warm and dry.  Psychiatric: She has a normal mood and affect. Her behavior is normal. Judgment and thought content normal.    Data Reviewed CLINICAL DATA: Palpable right breast mass.  EXAM:  DIGITAL DIAGNOSTIC bilateral MAMMOGRAM WITH CAD  ULTRASOUND right BREAST  COMPARISON: None.  ACR Breast Density Category b: There are scattered areas of  fibroglandular density.    FINDINGS:  There is an irregular mass with pleomorphic calcifications located  within the upper outer quadrant of the right breast. On mammography  this measures 1.6 cm in size. Also, there is an abnormal appearing  level 1 low right axillary lymph node present. There are no  additional findings within the right breast.  Calcifications are seen inferiorly within the left breast. On  additional evaluation these are shown to represent dermal  calcifications.  Mammographic images were processed with CAD.  On physical exam, there is a firm, mobile palpable  mass located  within the right breast which measures by PHYSICAL EXAMINATION  approximately 2 cm in size.  Ultrasound is performed, showing an irregularly marginated  hypoechoic mass with an echogenic rim located within the right  breast at the 10 o'clock position 8 cm from the nipple corresponding  to the palpable finding. This measures 1.8 x 1.7 x 1.2 cm in size  and is worrisome for invasive mammary carcinoma. Ultrasound of the  right axilla demonstrates a 1.3 cm abnormal appearing rounded low  axillary (level 1) lymph node with loss of a fatty hilum worrisome  for a metastatic lymph node.  I discussed ultrasound-guided core biopsy of the breast mass and  abnormal appearing right axillary lymph node with the patient. This  has been scheduled for 01/26/2013.  IMPRESSION:  1.8 cm irregular mass worrisome for invasive mammary carcinoma  located within the right breast at the 10 o'clock position 8 cm from  the nipple with an abnormal appearing level 1 right axillary lymph  node.  RECOMMENDATION:  Ultrasound-guided core biopsy of the right breast mass and abnormal  appearing right axillary lymph node.  I have discussed the findings and recommendations with the patient.  Results were also provided in writing at the conclusion of the  visit.  BI-RADS CATEGORY 5: Highly suggestive of malignancy - appropriate  action should be taken.   Electronically Signed  By: Luberta Robertson M.D.  On: 01/19/2013 17:24   Path   1.8 cm right breast outer quadrant  LN pos  Er/pr her 2 neu positive   Assessment    T1c N1 mX right breast cancer Patient Active Problem List   Diagnosis Date Noted  . Breast cancer, right 02/09/2013  . Breast cancer of upper-outer quadrant of right female breast 01/31/2013  . Atypical chest pain 02/12/2012  . Osteoarthritis   . Asthma 12/08/2010  . Degenerative joint disease 12/08/2010  . Obesity   . Anemia, iron deficiency   . Hyperlipidemia 10/16/2010  . Hypertension   . Diabetes mellitus, type 2   . Hyperthyroidism   . Tobacco abuse, in remission       Plan    Right lumpectomy  Right  ALND and port placement discussed. Pt may be eligible for trial study to be discussed by members of cancer center for neoadjuvant chemotherapy and avoid ALND and only have SLN mapping.  The procedure has been discussed with the patient. Alternatives to surgery have been discussed with the patient.  Risks of surgery include bleeding,  Infection,  Seroma formation, death,  and the need for further surgery.   The patient understands and wishes to proceed. ALND risks include bleeding infection  Arm swelling  Pain stiffness and more surgery. Discussed port placement as well.  Risks include bleeding,  pneumothorax  Malfunction organ injury   Death .  She needs time to think things over and decide.        Camilo Mander A. 02/09/2013, 10:21 AM

## 2013-02-17 NOTE — Interval H&P Note (Signed)
History and Physical Interval Note:  02/17/2013 7:25 AM  [name]  has presented today for surgery, with the diagnosis of BREAST CANCER   The various methods of treatment have been discussed with the patient and family. After consideration of risks, benefits and other options for treatment, the patient has consented to  Procedure(s): INSERTION PORT-A-CATH (N/A) as a surgical intervention .  The patient's history has been reviewed, patient examined, no change in status, stable for surgery.  I have reviewed the patient's chart and labs.  Questions were answered to the patient's satisfaction.  Risk of bleeding,  Infection,  Pneumothorax,  Hemothorax,  Puncture of major vascular structure,  Mediastinal injury,  Nerve injury and need for other operations and death.     Nakiyah Beverley A.

## 2013-02-17 NOTE — Anesthesia Preprocedure Evaluation (Addendum)
Anesthesia Evaluation  Patient identified by MRN, date of birth, ID band Patient awake    Reviewed: Allergy & Precautions, H&P , NPO status , Patient's Chart, lab work & pertinent test results, reviewed documented beta blocker date and time   Airway Mallampati: II TM Distance: >3 FB Neck ROM: Full    Dental no notable dental hx. (+) Teeth Intact and Dental Advisory Given   Pulmonary asthma , former smoker,  breath sounds clear to auscultation  Pulmonary exam normal       Cardiovascular hypertension, On Medications and On Home Beta Blockers Rhythm:Regular Rate:Normal     Neuro/Psych negative neurological ROS  negative psych ROS   GI/Hepatic negative GI ROS, Neg liver ROS,   Endo/Other  diabetes, Type 2, Oral Hypoglycemic AgentsHyperthyroidism Morbid obesity  Renal/GU negative Renal ROS  negative genitourinary   Musculoskeletal   Abdominal   Peds  Hematology negative hematology ROS (+)   Anesthesia Other Findings   Reproductive/Obstetrics negative OB ROS                          Anesthesia Physical Anesthesia Plan  ASA: III  Anesthesia Plan: General   Post-op Pain Management:    Induction: Intravenous  Airway Management Planned: LMA  Additional Equipment:   Intra-op Plan:   Post-operative Plan: Extubation in OR  Informed Consent: I have reviewed the patients History and Physical, chart, labs and discussed the procedure including the risks, benefits and alternatives for the proposed anesthesia with the patient or authorized representative who has indicated his/her understanding and acceptance.   Dental advisory given  Plan Discussed with: CRNA  Anesthesia Plan Comments:         Anesthesia Quick Evaluation

## 2013-02-17 NOTE — Anesthesia Procedure Notes (Signed)
Procedure Name: LMA Insertion Performed by: Stephie Xu W Pre-anesthesia Checklist: Patient identified, Timeout performed, Emergency Drugs available, Suction available and Patient being monitored Patient Re-evaluated:Patient Re-evaluated prior to inductionOxygen Delivery Method: Circle system utilized Preoxygenation: Pre-oxygenation with 100% oxygen Intubation Type: IV induction Ventilation: Mask ventilation without difficulty LMA: LMA with gastric port inserted LMA Size: 4.0 Tube type: Oral Number of attempts: 1 Placement Confirmation: breath sounds checked- equal and bilateral and positive ETCO2 Tube secured with: Tape Dental Injury: Teeth and Oropharynx as per pre-operative assessment      

## 2013-02-17 NOTE — Transfer of Care (Signed)
Immediate Anesthesia Transfer of Care Note  Patient: [name]  Procedure(s) Performed: Procedure(s): INSERTION PORT-A-CATH (Right)  Patient Location: PACU  Anesthesia Type:General  Level of Consciousness: awake, alert  and oriented  Airway & Oxygen Therapy: Patient Spontanous Breathing and Patient connected to face mask oxygen  Post-op Assessment: Report given to PACU RN and Post -op Vital signs reviewed and stable  Post vital signs: Reviewed and stable  Complications: No apparent anesthesia complications

## 2013-02-18 ENCOUNTER — Encounter (HOSPITAL_BASED_OUTPATIENT_CLINIC_OR_DEPARTMENT_OTHER): Payer: Self-pay | Admitting: Surgery

## 2013-02-18 NOTE — Op Note (Signed)
[name], S[age]              ACCOUNT NO.:  [id number]  MEDICAL RECORD NO.:  [id number]  LOCATION:                                 FACILITY:  PHYSICIAN:  Marcello Moores A. [name], M.D.DATE OF BIRTH:  05-Apr-1955  DATE OF PROCEDURE:  02/17/2013 DATE OF DISCHARGE:                              OPERATIVE REPORT   PREOPERATIVE DIAGNOSIS:  Stage II right breast cancer, in need of venous access for chemotherapy.  POSTOPERATIVE DIAGNOSIS:  Stage II right breast cancer, in need of venous access for chemotherapy.  PROCEDURE:  Placement of right internal jugular 8-French ClearView Port- A-Cath with ultrasound guidance and fluoroscopy.  SURGEON:  Marcello Moores A. Earnestine Shipp, M.D.  ANESTHESIA:  LMA with 0.25% Sensorcaine local.  EBL:  Minimal.  SPECIMENS:  None.  FINDINGS:  Tip with fluoroscopy of the catheter at the cavoatrial junction.  INDICATIONS FOR PROCEDURE:  The patient is a morbidly obese 58 year old female, who was diagnosed with stage II breast cancer.  She is in need of preoperative chemotherapy.  After discussion of vascular access options, we felt Port-A-Cath would be helpful for her.  Risk of bleeding, infection, migration, embolization catheter, pneumothorax, hemothorax, injury to major thoracic structures, death, DVT, the possibility of having open surgery if there were complications were all discussed.  Alternatives to Port-A-Cath discussed.  She agreed to proceed with Port-A-Cath placement.  DESCRIPTION OF PROCEDURE:  The patient was met in the holding area. Questions were answered.  She was taken back to the operating room where she was placed supine and LMA anesthesia was initiated.  Both arms were tucked.  The upper chest and neck regions were prepped and draped in sterile fashion.  She received 2 g of Ancef.  Time-out was done.  We placed in Trendelenburg.  Initial attempts to cannulate the right subclavian vein with the needle was successful.  In passing the  wire though, the wire would either go up to right internal jugular vein or  across  To the innominate vein.  We tried to pull her right arm down and  again could not get the wire to go down the superior vena cava.  At this point in time, I felt that a right internal jugular approach would be best.  Using ultrasound guidance, I identified the right internal jugular vein.  The needle was placed under ultrasound guidance into the right internal jugular vein with dark nonpulsatile bleeding from the needle noted. Wire was placed without difficulty.  Fluoroscopy showed the wire going down the right internal jugular vein into the right superior vena cava. A 3 cm incision was made just below the right clavicle.  Small cavity for the port was then made bluntly with cautery and my finger.  We then made a stab incision at the wire insertion site.  The tunneling device was used to tunnel the catheter up to the tunnel.  It was then attached to the port flushed without difficulty.  The catheter was trimmed to approximately 14 cm from the skin insertion site.  With the patient in Trendelenburg, a dilator  and introducer complex was placed over the wire moving the wire to and fro as it was advanced with no resistance.  I removed the wire and dilator leaving the introducer in place.  The catheter was placed the peel-away sheath and peeled away. Final film showed the tip to be at the cavoatrial junction.  There was no significant kinking.  I was able to draw back the catheter easily and got return of dark blood.  It was then flushed with heparinized saline without difficulty.  5 mL of 100 units/mL of heparinized saline placed in the catheter.  We then closed the incision with a combination of 3-0 Vicryl and 4-0 Monocryl.  Dermabond applied.  All final counts found to be correct.  The patient was awoke, taken to recovery in satisfactory condition.     Shanterria Franta A. Keymari Sato, M.D.     TAC/MEDQ  D:   02/17/2013  T:  02/18/2013  Job:  [id number]

## 2013-02-21 ENCOUNTER — Ambulatory Visit (HOSPITAL_COMMUNITY)
Admission: RE | Admit: 2013-02-21 | Discharge: 2013-02-21 | Disposition: A | Discharge status: Home / Self Care | Payer: Medicare Other | Source: Ambulatory Visit | Attending: Oncology | Admitting: Oncology

## 2013-02-21 ENCOUNTER — Telehealth: Payer: Self-pay | Admitting: *Deleted

## 2013-02-21 ENCOUNTER — Encounter: Payer: Self-pay | Admitting: *Deleted

## 2013-02-21 ENCOUNTER — Ambulatory Visit (HOSPITAL_BASED_OUTPATIENT_CLINIC_OR_DEPARTMENT_OTHER)
Admission: RE | Admit: 2013-02-21 | Discharge: 2013-02-21 | Disposition: A | Discharge status: Home / Self Care | Payer: Medicare Other | Source: Ambulatory Visit | Attending: Internal Medicine | Admitting: Internal Medicine

## 2013-02-21 VITALS — BP 154/86 | HR 90 | Wt 269.5 lb

## 2013-02-21 DIAGNOSIS — E785 Hyperlipidemia, unspecified: Secondary | ICD-10-CM | POA: Insufficient documentation

## 2013-02-21 DIAGNOSIS — I10 Essential (primary) hypertension: Secondary | ICD-10-CM | POA: Insufficient documentation

## 2013-02-21 DIAGNOSIS — E119 Type 2 diabetes mellitus without complications: Secondary | ICD-10-CM | POA: Insufficient documentation

## 2013-02-21 DIAGNOSIS — Z87891 Personal history of nicotine dependence: Secondary | ICD-10-CM | POA: Insufficient documentation

## 2013-02-21 DIAGNOSIS — J45909 Unspecified asthma, uncomplicated: Secondary | ICD-10-CM | POA: Insufficient documentation

## 2013-02-21 DIAGNOSIS — D649 Anemia, unspecified: Secondary | ICD-10-CM | POA: Insufficient documentation

## 2013-02-21 DIAGNOSIS — C50411 Malignant neoplasm of upper-outer quadrant of right female breast: Secondary | ICD-10-CM

## 2013-02-21 DIAGNOSIS — C50919 Malignant neoplasm of unspecified site of unspecified female breast: Secondary | ICD-10-CM | POA: Insufficient documentation

## 2013-02-21 DIAGNOSIS — M1990 Unspecified osteoarthritis, unspecified site: Secondary | ICD-10-CM | POA: Insufficient documentation

## 2013-02-21 DIAGNOSIS — C50419 Malignant neoplasm of upper-outer quadrant of unspecified female breast: Secondary | ICD-10-CM

## 2013-02-21 DIAGNOSIS — E669 Obesity, unspecified: Secondary | ICD-10-CM | POA: Insufficient documentation

## 2013-02-21 DIAGNOSIS — E0500 Thyrotoxicosis with diffuse goiter without thyrotoxic crisis or storm: Secondary | ICD-10-CM | POA: Insufficient documentation

## 2013-02-21 DIAGNOSIS — I517 Cardiomegaly: Secondary | ICD-10-CM

## 2013-02-21 NOTE — Addendum Note (Signed)
Encounter addended by: Scarlette Calico, RN on: 02/21/2013  9:52 AM<BR>     Documentation filed: Vitals Section

## 2013-02-21 NOTE — Addendum Note (Signed)
Encounter addended by: Scarlette Calico, RN on: 02/21/2013  9:49 AM<BR>     Documentation filed: Patient Instructions Section

## 2013-02-21 NOTE — Telephone Encounter (Signed)
Spoke to pt concerning nutrition appt.  Pt request to change it to another day d/t it being on the same day as chemo.  I sent a POF to schedulers to change it to any other day per pt request.  Pt also request to make all future appts in the morning d/t transportation.  Informed pt to make that request to schedulers at her appt on 02/22/13.  Received verbal understanding.  Pt denies further needs at this time.  Encourage pt to call with needs. Contact information given.

## 2013-02-21 NOTE — Patient Instructions (Signed)
We will contact you in 3 months to schedule your next appointment and echocardiogram  

## 2013-02-21 NOTE — Progress Notes (Signed)
  Echocardiogram 2D Echocardiogram has been performed.  [place], [place] 02/21/2013, 9:11 AM

## 2013-02-21 NOTE — Progress Notes (Signed)
PCP: [facility]tazar Apo Cardiologist: previously Dr. Lattie Haw Oncologist: Dr. Humphrey Rolls Patient ID: [name], female   DOB: [birth year], 58 y.o.   MRN: [id number]    HPI:  [name] is a 58 y.o. female. 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 The biopsied right lymph node was positive for metastatic disease. She is referred to the Cardio-Oncology clinic for monitoring during Herceptin therapy.   Medical hx notable for morbid obesity, DM2, Graves disease, severe HTN, tobacco abuse (q 1990). Had cardiac cath in 1995 which showed normal coronaries.   Due to start Taxotere carboplatinum Herceptin/perjeta this Wednesday 02/23/13  Walks slowly with a cane. Mild dyspnea on exam. No CP. Occasional edema. Still having problems with L leg after L TKA in 2003  ECHO 02/21/13: LVEF 60-65% Grade 1 DD.  lateral s' 10.3 cm/s. GLS -18.4  Review of Systems: [y] = yes, [ ]  = no   General: Weight gain [ ] ; Weight loss [ ] ; Anorexia [ ] ; Fatigue [ ] ; Fever [ ] ; Chills [ ] ; Weakness [ ]   Cardiac: Chest pain/pressure [ ] ; Resting SOB [ ] ; Exertional SOB [ y]; Orthopnea [ ] ; Pedal Edema [ ] ; Palpitations [ ] ; Syncope [ ] ; Presyncope [ ] ; Paroxysmal nocturnal dyspnea[ ]   Pulmonary: Cough [ ] ; Wheezing[ ] ; Hemoptysis[ ] ; Sputum [ ] ; Snoring [ ]   GI: Vomiting[ ] ; Dysphagia[ ] ; Melena[ ] ; Hematochezia [ ] ; Heartburn[ ] ; Abdominal pain [ ] ; Constipation [ ] ; Diarrhea [ ] ; BRBPR [ ]   GU: Hematuria[ ] ; Dysuria [ ] ; Nocturia[ ]   Vascular: Pain in legs with walking [ ] ; Pain in feet with lying flat [ ] ; Non-healing sores [ ] ; Stroke [ ] ; TIA [ ] ; Slurred speech [ ] ;  Neuro: Headaches[ ] ; Vertigo[ ] ; Seizures[ ] ; Paresthesias[ ] ;Blurred vision [ ] ; Diplopia [ ] ; Vision changes [ ]   Ortho/Skin: Arthritis [ ] ; Joint pain Blue.Reese ]; Muscle pain [ ] ; Joint swelling [ ] ; Back Pain [ ] ; Rash [ ]   Psych: Depression[ ] ; Anxiety[ ]   Heme: Bleeding  problems [ ] ; Clotting disorders [ ] ; Anemia [ ]   Endocrine: Diabetes Blue.Reese ]; Thyroid dysfunctiony ]   Past Medical History  Diagnosis Date  . Hyperlipidemia   . Obesity   . Asthma     prn neb. and inhaler  . Osteoarthritis 2003    left knee  . Non-insulin dependent type 2 diabetes mellitus   . Graves' disease   . Ophthalmic manifestation of Graves disease   . Hypertension     on multiple meds., has been on med. > 14 yr.  . Breast cancer 01/2013    right    Current Outpatient Prescriptions  Medication Sig Dispense Refill  . ACCU-CHEK SMARTVIEW test strip USE ONE STRIP TO CHECK GLUCOSE 4 TIMES DAILY  100 each  5  . albuterol (PROVENTIL HFA;VENTOLIN HFA) 108 (90 BASE) MCG/ACT inhaler Inhale 2 puffs into the lungs every 4 (four) hours as needed.  18 g  2  . albuterol (PROVENTIL) (5 MG/ML) 0.5% nebulizer solution Take 2.5 mg by nebulization every 6 (six) hours as needed for wheezing or shortness of breath.      Marland Kitchen amLODipine (NORVASC) 10 MG tablet TAKE ONE TABLET BY MOUTH ONCE DAILY.  90 tablet  1  . Artificial Tear Ointment (ARTIFICIAL TEARS) ointment Place 1 drop into both eyes as needed.       Marland Kitchen aspirin 81 MG tablet  Take 81 mg by mouth daily.      . benazepril (LOTENSIN) 40 MG tablet TAKE ONE TABLET BY MOUTH ONCE DAILY.  30 tablet  5  . Biotin 1000 MCG tablet Take 1,000 mcg by mouth daily.      . cloNIDine (CATAPRES - DOSED IN MG/24 HR) 0.2 mg/24hr patch Place 1 patch (0.2 mg total) onto the skin once a week.  4 patch  2  . cycloSPORINE (RESTASIS) 0.05 % ophthalmic emulsion Place 1 drop into both eyes 2 (two) times daily.       Marland Kitchen glipiZIDE (GLUCOTROL XL) 5 MG 24 hr tablet Take 5 mg by mouth every morning.      . hydrochlorothiazide (HYDRODIURIL) 25 MG tablet TAKE ONE TABLET BY MOUTH ONCE DAILY.  90 tablet  1  . labetalol (NORMODYNE) 300 MG tablet TAKE (1) TABLET BY MOUTH TWICE DAILY.  60 tablet  3  . levothyroxine (SYNTHROID, LEVOTHROID) 100 MCG tablet Take 100 mcg by mouth every  morning.      . lidocaine-prilocaine (EMLA) cream Apply 1 application topically as needed.  30 g  0  . metFORMIN (GLUCOPHAGE) 500 MG tablet Take 1 tablet (500 mg total) by mouth 2 (two) times daily with a meal.  60 tablet  6  . oxyCODONE-acetaminophen (ROXICET) 5-325 MG per tablet Take 1-2 tablets by mouth every 4 (four) hours as needed.  30 tablet  0  . pravastatin (PRAVACHOL) 80 MG tablet Take 1 tablet (80 mg total) by mouth at bedtime.  30 tablet  6   No current facility-administered medications for this encounter.    Allergies  Allergen Reactions  . Procardia [Nifedipine] Other (See Comments)    MIGRAINES    History   Social History  . Marital Status: Divorced    Spouse Name: N/A    Number of Children: 2  . Years of Education: N/A   Occupational History  . Disability    Social History Main Topics  . Smoking status: Former Smoker -- 20 years    Quit date: 02/10/1991  . Smokeless tobacco: Never Used  . Alcohol Use: No  . Drug Use: No  . Sexual Activity: No   Other Topics Concern  . Not on file   Social History Narrative   Lives in [place]          Family History  Problem Relation Age of Onset  . Heart disease Father   . Lung cancer Father   . Diabetes Mother     PHYSICAL EXAM: Filed Vitals:   02/21/13 0916  BP: 154/86  Pulse: 90  Weight: 226 lb 8 oz (102.74 kg)  SpO2: 98%    General:  Well appearing. No respiratory difficulty HEENT: normal x for exophthalmosis  Neck: supple. no JVD. Carotids 2+ bilat; no bruits. No lymphadenopathy or thryomegaly appreciated. Cor: PMI nondisplaced. Regular rate & rhythm. No rubs, gallops or murmurs. Lungs: clear Abdomen: obese soft, nontender, nondistended. No hepatosplenomegaly. No bruits or masses. Good bowel sounds. Extremities: no cyanosis, clubbing, rash, edema Neuro: alert & oriented x 3, cranial nerves grossly intact. moves all 4 extremities w/o difficulty. Affect pleasant.   ASSESSMENT & PLAN: 1. R  breast cancer 2. HTN -followed by Dr. Wolfgang Phoenix.   Explained incidence of Herceptin cardiotoxicity and role of Cardio-oncology clinic at length. Echo images reviewed personally. All parameters stable. Reviewed signs and symptoms of HF to look for. Continue Herceptin. Follow-up with echo in 3 months.  Daniel Bensimhon,MD 9:27 AM

## 2013-02-22 ENCOUNTER — Telehealth: Payer: Self-pay | Admitting: *Deleted

## 2013-02-22 ENCOUNTER — Ambulatory Visit (HOSPITAL_BASED_OUTPATIENT_CLINIC_OR_DEPARTMENT_OTHER): Payer: Medicare Other | Admitting: Oncology

## 2013-02-22 ENCOUNTER — Other Ambulatory Visit (HOSPITAL_BASED_OUTPATIENT_CLINIC_OR_DEPARTMENT_OTHER): Payer: Medicare Other

## 2013-02-22 ENCOUNTER — Telehealth: Payer: Self-pay | Admitting: Oncology

## 2013-02-22 VITALS — BP 165/89 | HR 89 | Temp 98.2°F | Resp 20 | Ht 64.5 in | Wt 269.5 lb

## 2013-02-22 DIAGNOSIS — C50419 Malignant neoplasm of upper-outer quadrant of unspecified female breast: Secondary | ICD-10-CM

## 2013-02-22 DIAGNOSIS — C50411 Malignant neoplasm of upper-outer quadrant of right female breast: Secondary | ICD-10-CM

## 2013-02-22 DIAGNOSIS — Z170 Estrogen receptor positive status [ER+]: Secondary | ICD-10-CM

## 2013-02-22 DIAGNOSIS — F17201 Nicotine dependence, unspecified, in remission: Secondary | ICD-10-CM

## 2013-02-22 DIAGNOSIS — E119 Type 2 diabetes mellitus without complications: Secondary | ICD-10-CM

## 2013-02-22 DIAGNOSIS — C773 Secondary and unspecified malignant neoplasm of axilla and upper limb lymph nodes: Secondary | ICD-10-CM

## 2013-02-22 LAB — CBC WITH DIFFERENTIAL/PLATELET
BASO%: 0.3 % (ref 0.0–2.0)
Basophils Absolute: 0 10*3/uL (ref 0.0–0.1)
EOS%: 2.1 % (ref 0.0–7.0)
Eosinophils Absolute: 0.1 10*3/uL (ref 0.0–0.5)
HEMATOCRIT: 34.8 % (ref 34.8–46.6)
HGB: 11.4 g/dL — ABNORMAL LOW (ref 11.6–15.9)
LYMPH%: 23.2 % (ref 14.0–49.7)
MCH: 26.5 pg (ref 25.1–34.0)
MCHC: 32.8 g/dL (ref 31.5–36.0)
MCV: 80.9 fL (ref 79.5–101.0)
MONO#: 0.3 10*3/uL (ref 0.1–0.9)
MONO%: 4.6 % (ref 0.0–14.0)
NEUT#: 4.4 10*3/uL (ref 1.5–6.5)
NEUT%: 69.8 % (ref 38.4–76.8)
Platelets: 217 10*3/uL (ref 145–400)
RBC: 4.3 10*6/uL (ref 3.70–5.45)
RDW: 16.3 % — AB (ref 11.2–14.5)
WBC: 6.3 10*3/uL (ref 3.9–10.3)
lymph#: 1.5 10*3/uL (ref 0.9–3.3)
nRBC: 0 % (ref 0–0)

## 2013-02-22 LAB — COMPREHENSIVE METABOLIC PANEL (CC13)
ALBUMIN: 4.1 g/dL (ref 3.5–5.0)
ALT: 11 U/L (ref 0–55)
AST: 14 U/L (ref 5–34)
Alkaline Phosphatase: 79 U/L (ref 40–150)
Anion Gap: 12 mEq/L — ABNORMAL HIGH (ref 3–11)
BUN: 12.9 mg/dL (ref 7.0–26.0)
CALCIUM: 9.7 mg/dL (ref 8.4–10.4)
CHLORIDE: 104 meq/L (ref 98–109)
CO2: 28 mEq/L (ref 22–29)
Creatinine: 1 mg/dL (ref 0.6–1.1)
Glucose: 240 mg/dl — ABNORMAL HIGH (ref 70–140)
POTASSIUM: 3.7 meq/L (ref 3.5–5.1)
SODIUM: 144 meq/L (ref 136–145)
TOTAL PROTEIN: 7.6 g/dL (ref 6.4–8.3)
Total Bilirubin: 0.49 mg/dL (ref 0.20–1.20)

## 2013-02-22 NOTE — Telephone Encounter (Signed)
Per staff message I have adjusted 3/10 appt

## 2013-02-23 ENCOUNTER — Ambulatory Visit: Payer: Medicare Other | Admitting: Nutrition

## 2013-02-23 ENCOUNTER — Telehealth: Payer: Self-pay | Admitting: Oncology

## 2013-02-23 ENCOUNTER — Ambulatory Visit (HOSPITAL_BASED_OUTPATIENT_CLINIC_OR_DEPARTMENT_OTHER): Payer: Medicare Other

## 2013-02-23 ENCOUNTER — Ambulatory Visit: Payer: Medicare Other

## 2013-02-23 VITALS — BP 138/62 | HR 88 | Temp 98.2°F | Resp 18 | Ht 64.5 in

## 2013-02-23 DIAGNOSIS — C50419 Malignant neoplasm of upper-outer quadrant of unspecified female breast: Secondary | ICD-10-CM

## 2013-02-23 DIAGNOSIS — C773 Secondary and unspecified malignant neoplasm of axilla and upper limb lymph nodes: Secondary | ICD-10-CM

## 2013-02-23 DIAGNOSIS — Z5112 Encounter for antineoplastic immunotherapy: Secondary | ICD-10-CM

## 2013-02-23 DIAGNOSIS — C50411 Malignant neoplasm of upper-outer quadrant of right female breast: Secondary | ICD-10-CM

## 2013-02-23 MED ORDER — DIPHENHYDRAMINE HCL 25 MG PO CAPS
ORAL_CAPSULE | ORAL | Status: AC
  Filled 2013-02-23: qty 2

## 2013-02-23 MED ORDER — ACETAMINOPHEN 325 MG PO TABS
ORAL_TABLET | ORAL | Status: AC
  Filled 2013-02-23: qty 2

## 2013-02-23 MED ORDER — DEXAMETHASONE SODIUM PHOSPHATE 20 MG/5ML IJ SOLN
20.0000 mg | Freq: Once | INTRAMUSCULAR | Status: AC
  Administered 2013-02-23: 20 mg via INTRAVENOUS

## 2013-02-23 MED ORDER — TRASTUZUMAB CHEMO INJECTION 440 MG
8.0000 mg/kg | Freq: Once | INTRAVENOUS | Status: AC
  Administered 2013-02-23: 1008 mg via INTRAVENOUS
  Filled 2013-02-23: qty 48

## 2013-02-23 MED ORDER — SODIUM CHLORIDE 0.9 % IV SOLN
900.0000 mg | Freq: Once | INTRAVENOUS | Status: DC
  Filled 2013-02-23: qty 90

## 2013-02-23 MED ORDER — DEXAMETHASONE SODIUM PHOSPHATE 20 MG/5ML IJ SOLN
INTRAMUSCULAR | Status: AC
  Filled 2013-02-23: qty 5

## 2013-02-23 MED ORDER — ONDANSETRON 16 MG/50ML IVPB (CHCC)
16.0000 mg | Freq: Once | INTRAVENOUS | Status: AC
  Administered 2013-02-23: 16 mg via INTRAVENOUS

## 2013-02-23 MED ORDER — SODIUM CHLORIDE 0.9 % IV SOLN
840.0000 mg | Freq: Once | INTRAVENOUS | Status: AC
  Administered 2013-02-23: 840 mg via INTRAVENOUS
  Filled 2013-02-23: qty 28

## 2013-02-23 MED ORDER — ACETAMINOPHEN 325 MG PO TABS
650.0000 mg | ORAL_TABLET | Freq: Once | ORAL | Status: AC
  Administered 2013-02-23: 650 mg via ORAL

## 2013-02-23 MED ORDER — DIPHENHYDRAMINE HCL 25 MG PO CAPS
50.0000 mg | ORAL_CAPSULE | Freq: Once | ORAL | Status: AC
  Administered 2013-02-23: 50 mg via ORAL

## 2013-02-23 MED ORDER — SODIUM CHLORIDE 0.9 % IV SOLN
75.0000 mg/m2 | Freq: Once | INTRAVENOUS | Status: DC
  Filled 2013-02-23: qty 18

## 2013-02-23 MED ORDER — SODIUM CHLORIDE 0.9 % IV SOLN
Freq: Once | INTRAVENOUS | Status: AC
  Administered 2013-02-23: 13:00:00 via INTRAVENOUS

## 2013-02-23 MED ORDER — HEPARIN SOD (PORK) LOCK FLUSH 100 UNIT/ML IV SOLN
500.0000 [IU] | Freq: Once | INTRAVENOUS | Status: AC | PRN
  Administered 2013-02-23: 500 [IU]
  Filled 2013-02-23: qty 5

## 2013-02-23 MED ORDER — SODIUM CHLORIDE 0.9 % IJ SOLN
10.0000 mL | INTRAMUSCULAR | Status: DC | PRN
  Administered 2013-02-23: 10 mL
  Filled 2013-02-23: qty 10

## 2013-02-23 MED ORDER — ONDANSETRON 16 MG/50ML IVPB (CHCC)
INTRAVENOUS | Status: AC
  Filled 2013-02-23: qty 16

## 2013-02-23 NOTE — Progress Notes (Signed)
58 year old female diagnosed with breast cancer.  She is a patient of Dr. Chancy Milroy.  Past medical history includes hyperlipidemia, obesity, osteoarthritis, non-insulin-dependent diabetes, Graves' disease, and hypertension.  Medications include Glucotrol, Synthroid, and Glucophage.  Patient reports she was recently put on Lantus.  Labs include a glucose of 133.  Height: 64.5 inches. Weight: 269.5 pounds February 10. Usual body weight: 281 pounds September 2014. BMI: 45.56.  I spoke with patient during chemotherapy at her request.  Patient reports she is very concerned that her blood sugars became elevated after receiving steroids.  She states her physician has put her on Lantus and she is afraid to eat because her blood sugars have been elevated.  Nutrition diagnosis: Food and nutrition related knowledge deficit related to diagnosis of breast cancer and associated treatments as evidenced by no prior need for nutrition related information.  Intervention: Patient was educated that she should continue to follow a healthy, plant-based diet with controlled carbohydrate intake.  I reinforced the importance of higher fiber foods.  I've encouraged patient to be sure she consumes protein, and or healthy fats along with carbohydrates for improved blood sugar control.  Patient educated to continue discussions with physician regarding recent blood sugar levels.  Questions were answered.  Teach back method used.  Monitoring, evaluation, goals: Patient will tolerate carbohydrate-controlled healthy, plant-based diet to promote maintenance of lean body mass and adequate glycemic control.  Next visit: Patient will call me if she has questions or concerns.

## 2013-02-23 NOTE — Patient Instructions (Signed)
[facility] Discharge Instructions for Patients Receiving Chemotherapy  Today you received the following chemotherapy agents carboplatin and Taxotere . You also received 2 biotherapy drugs called herceptin and  perjeta . To help prevent nausea and vomiting after your treatment, we encourage you to take your nausea medication as prescribed.   If you develop nausea and vomiting that is not controlled by your nausea medication, call the clinic.   BELOW ARE SYMPTOMS THAT SHOULD BE REPORTED IMMEDIATELY:  *FEVER GREATER THAN 100.5 F  *CHILLS WITH OR WITHOUT FEVER  NAUSEA AND VOMITING THAT IS NOT CONTROLLED WITH YOUR NAUSEA MEDICATION  *UNUSUAL SHORTNESS OF BREATH  *UNUSUAL BRUISING OR BLEEDING  TENDERNESS IN MOUTH AND THROAT WITH OR WITHOUT PRESENCE OF ULCERS  *URINARY PROBLEMS  *BOWEL PROBLEMS  UNUSUAL RASH Items with * indicate a potential emergency and should be followed up as soon as possible.  Feel free to call the clinic you have any questions or concerns. The clinic phone number is [phone].  Trastuzumab injection for infusion What is this medicine? TRASTUZUMAB (tras TOO zoo mab) is a monoclonal antibody. It targets a protein called HER2. This protein is found in some stomach and breast cancers. This medicine can stop cancer cell growth. This medicine may be used with other cancer treatments. This medicine may be used for other purposes; ask your health care provider or pharmacist if you have questions. COMMON BRAND NAME(S): Herceptin What should I tell my health care provider before I take this medicine? They need to know if you have any of these conditions: -heart disease -heart failure -infection (especially a virus infection such as chickenpox, cold sores, or herpes) -lung or breathing disease, like asthma -recent or ongoing radiation therapy -an unusual or allergic reaction to trastuzumab, benzyl alcohol, or other medications, foods, dyes, or  preservatives -pregnant or trying to get pregnant -breast-feeding How should I use this medicine? This drug is given as an infusion into a vein. It is administered in a hospital or clinic by a specially trained health care professional. Talk to your pediatrician regarding the use of this medicine in children. This medicine is not approved for use in children. Overdosage: If you think you have taken too much of this medicine contact a poison control center or emergency room at once. NOTE: This medicine is only for you. Do not share this medicine with others. What if I miss a dose? It is important not to miss a dose. Call your doctor or health care professional if you are unable to keep an appointment. What may interact with this medicine? -cyclophosphamide -doxorubicin -warfarin This list may not describe all possible interactions. Give your health care provider a list of all the medicines, herbs, non-prescription drugs, or dietary supplements you use. Also tell them if you smoke, drink alcohol, or use illegal drugs. Some items may interact with your medicine. What should I watch for while using this medicine? Visit your doctor for checks on your progress. Report any side effects. Continue your course of treatment even though you feel ill unless your doctor tells you to stop. Call your doctor or health care professional for advice if you get a fever, chills or sore throat, or other symptoms of a cold or flu. Do not treat yourself. Try to avoid being around people who are sick. You may experience fever, chills and shaking during your first infusion. These effects are usually mild and can be treated with other medicines. Report any side effects during the infusion  to your health care professional. Fever and chills usually do not happen with later infusions. What side effects may I notice from receiving this medicine? Side effects that you should report to your doctor or other health care  professional as soon as possible: -breathing difficulties -chest pain or palpitations -cough -dizziness or fainting -fever or chills, sore throat -skin rash, itching or hives -swelling of the legs or ankles -unusually weak or tired Side effects that usually do not require medical attention (report to your doctor or other health care professional if they continue or are bothersome): -loss of appetite -headache -muscle aches -nausea This list may not describe all possible side effects. Call your doctor for medical advice about side effects. You may report side effects to FDA at 1-800-FDA-1088. Where should I keep my medicine? This drug is given in a hospital or clinic and will not be stored at home. NOTE: This sheet is a summary. It may not cover all possible information. If you have questions about this medicine, talk to your doctor, pharmacist, or health care provider.  2014, Elsevier/Gold Standard. (2008-11-03 13:43:15)  Pertuzumab injection What is this medicine? PERTUZUMAB (per TOOZ ue mab) is a monoclonal antibody that targets a protein called HER2. HER2 is found in some breast cancers. This medicine can stop cancer cell growth. This medicine is used with other cancer treatments. This medicine may be used for other purposes; ask your health care provider or pharmacist if you have questions. COMMON BRAND NAME(S): PERJETA What should I tell my health care provider before I take this medicine? They need to know if you have any of these conditions: -heart disease -heart failure -high blood pressure -history of irregular heart beat -recent or ongoing radiation therapy -an unusual or allergic reaction to pertuzumab, other medicines, foods, dyes, or preservatives -pregnant or trying to get pregnant -breast-feeding How should I use this medicine? This medicine is for infusion into a vein. It is given by a health care professional in a hospital or clinic setting. Talk to your  pediatrician regarding the use of this medicine in children. Special care may be needed. Overdosage: If you think you've taken too much of this medicine contact a poison control center or emergency room at once. Overdosage: If you think you have taken too much of this medicine contact a poison control center or emergency room at once. NOTE: This medicine is only for you. Do not share this medicine with others. What if I miss a dose? It is important not to miss your dose. Call your doctor or health care professional if you are unable to keep an appointment. What may interact with this medicine? Interactions are not expected. Give your health care provider a list of all the medicines, herbs, non-prescription drugs, or dietary supplements you use. Also tell them if you smoke, drink alcohol, or use illegal drugs. Some items may interact with your medicine. This list may not describe all possible interactions. Give your health care provider a list of all the medicines, herbs, non-prescription drugs, or dietary supplements you use. Also tell them if you smoke, drink alcohol, or use illegal drugs. Some items may interact with your medicine. What should I watch for while using this medicine? Your condition will be monitored carefully while you are receiving this medicine. Report any side effects. Continue your course of treatment even though you feel ill unless your doctor tells you to stop. Do not become pregnant while taking this medicine. Women should inform their doctor if they  wish to become pregnant or think they might be pregnant. There is a potential for serious side effects to an unborn child. Talk to your health care professional or pharmacist for more information. Do not breast-feed an infant while taking this medicine. Call your doctor or health care professional for advice if you get a fever, chills or sore throat, or other symptoms of a cold or flu. Do not treat yourself. Try to avoid being around  people who are sick. You may experience fever, chills, and headache during the infusion. Report any side effects during the infusion to your health care professional. What side effects may I notice from receiving this medicine? Side effects that you should report to your doctor or health care professional as soon as possible: -breathing problems -chest pain or palpitations -dizziness -feeling faint or lightheaded -fever or chills -skin rash, itching or hives -sore throat -swelling of the face, lips, or tongue -swelling of the legs or ankles -unusually weak or tired  Side effects that usually do not require medical attention (Report these to your doctor or health care professional if they continue or are bothersome.): -diarrhea -hair loss -nausea, vomiting -tiredness This list may not describe all possible side effects. Call your doctor for medical advice about side effects. You may report side effects to FDA at 1-800-FDA-1088. Where should I keep my medicine? This drug is given in a hospital or clinic and will not be stored at home. NOTE: This sheet is a summary. It may not cover all possible information. If you have questions about this medicine, talk to your doctor, pharmacist, or health care provider.  2014, Elsevier/Gold Standard. (2011-10-29 16:54:15)

## 2013-02-23 NOTE — Telephone Encounter (Signed)
received call from inf nurse to add inf 3hr for 2/12 as pt would need to return tomorrow to coninue tx. due to continued tx 2/12 pt inj appt for 2/12 moved to 2/13. per inf nurse pt would be told and given appt/schedule in back. d/t/location for 2/12 inf per inf nurse (amy h)

## 2013-02-24 ENCOUNTER — Ambulatory Visit (HOSPITAL_BASED_OUTPATIENT_CLINIC_OR_DEPARTMENT_OTHER): Payer: Medicare Other

## 2013-02-24 ENCOUNTER — Ambulatory Visit (HOSPITAL_COMMUNITY): Payer: Medicare Other

## 2013-02-24 ENCOUNTER — Ambulatory Visit: Payer: Medicare Other

## 2013-02-24 VITALS — BP 168/83 | HR 65 | Temp 98.7°F | Resp 20

## 2013-02-24 DIAGNOSIS — C773 Secondary and unspecified malignant neoplasm of axilla and upper limb lymph nodes: Secondary | ICD-10-CM

## 2013-02-24 DIAGNOSIS — Z5111 Encounter for antineoplastic chemotherapy: Secondary | ICD-10-CM

## 2013-02-24 DIAGNOSIS — C50411 Malignant neoplasm of upper-outer quadrant of right female breast: Secondary | ICD-10-CM

## 2013-02-24 DIAGNOSIS — C50419 Malignant neoplasm of upper-outer quadrant of unspecified female breast: Secondary | ICD-10-CM

## 2013-02-24 MED ORDER — DEXTROSE 5 % IV SOLN: 75.0000 mg/m2 | Freq: Once | INTRAVENOUS | Status: DC

## 2013-02-24 MED ORDER — ONDANSETRON 16 MG/50ML IVPB (CHCC)
INTRAVENOUS | Status: AC
  Filled 2013-02-24: qty 16

## 2013-02-24 MED ORDER — DEXAMETHASONE SODIUM PHOSPHATE 20 MG/5ML IJ SOLN
INTRAMUSCULAR | Status: AC
  Filled 2013-02-24: qty 5

## 2013-02-24 MED ORDER — DEXAMETHASONE SODIUM PHOSPHATE 20 MG/5ML IJ SOLN
20.0000 mg | Freq: Once | INTRAMUSCULAR | Status: AC
  Administered 2013-02-24: 20 mg via INTRAVENOUS

## 2013-02-24 MED ORDER — DOCETAXEL CHEMO INJECTION 160 MG/16ML
75.0000 mg/m2 | Freq: Once | INTRAVENOUS | Status: AC
  Administered 2013-02-24: 180 mg via INTRAVENOUS
  Filled 2013-02-24: qty 18

## 2013-02-24 MED ORDER — SODIUM CHLORIDE 0.9 % IJ SOLN
10.0000 mL | INTRAMUSCULAR | Status: DC | PRN
  Administered 2013-02-24: 10 mL
  Filled 2013-02-24: qty 10

## 2013-02-24 MED ORDER — HEPARIN SOD (PORK) LOCK FLUSH 100 UNIT/ML IV SOLN
500.0000 [IU] | Freq: Once | INTRAVENOUS | Status: AC | PRN
  Administered 2013-02-24: 500 [IU]
  Filled 2013-02-24: qty 5

## 2013-02-24 MED ORDER — CARBOPLATIN CHEMO INJECTION 600 MG/60ML
900.0000 mg | Freq: Once | INTRAVENOUS | Status: AC
  Administered 2013-02-24: 900 mg via INTRAVENOUS
  Filled 2013-02-24: qty 90

## 2013-02-24 MED ORDER — ONDANSETRON 16 MG/50ML IVPB (CHCC)
16.0000 mg | Freq: Once | INTRAVENOUS | Status: AC
  Administered 2013-02-24: 16 mg via INTRAVENOUS

## 2013-02-24 MED ORDER — SODIUM CHLORIDE 0.9 % IV SOLN
Freq: Once | INTRAVENOUS | Status: AC
  Administered 2013-02-24: 10:00:00 via INTRAVENOUS

## 2013-02-24 NOTE — Patient Instructions (Addendum)
[facility] Discharge Instructions for Patients Receiving Chemotherapy  Today you received the following chemotherapy agents: Taxotere and Carboplatin   To help prevent nausea and vomiting after your treatment, we encourage you to take your nausea medication as prescribed. You received Zofran and Decadron (Steroid) in the infusion room prior to chemotherapy.    If you develop nausea and vomiting that is not controlled by your nausea medication, call the clinic.   BELOW ARE SYMPTOMS THAT SHOULD BE REPORTED IMMEDIATELY:  *FEVER GREATER THAN 100.5 F  *CHILLS WITH OR WITHOUT FEVER  NAUSEA AND VOMITING THAT IS NOT CONTROLLED WITH YOUR NAUSEA MEDICATION  *UNUSUAL SHORTNESS OF BREATH  *UNUSUAL BRUISING OR BLEEDING  TENDERNESS IN MOUTH AND THROAT WITH OR WITHOUT PRESENCE OF ULCERS  *URINARY PROBLEMS  *BOWEL PROBLEMS  UNUSUAL RASH Items with * indicate a potential emergency and should be followed up as soon as possible.  Feel free to call the clinic you have any questions or concerns. The clinic phone number is [phone].   Docetaxel injection (Taxotere) What is this medicine? DOCETAXEL (doe se TAX el) is a chemotherapy drug. It targets fast dividing cells, like cancer cells, and causes these cells to die. This medicine is used to treat many types of cancers like breast cancer, certain stomach cancers, head and neck cancer, lung cancer, and prostate cancer. This medicine may be used for other purposes; ask your health care provider or pharmacist if you have questions. COMMON BRAND NAME(S): Docefrez , Taxotere What should I tell my health care provider before I take this medicine? They need to know if you have any of these conditions: -infection (especially a virus infection such as chickenpox, cold sores, or herpes) -liver disease -low blood counts, like low white cell, platelet, or red cell counts -an unusual or allergic reaction to docetaxel, polysorbate 80,  other chemotherapy agents, other medicines, foods, dyes, or preservatives -pregnant or trying to get pregnant -breast-feeding How should I use this medicine? This drug is given as an infusion into a vein. It is administered in a hospital or clinic by a specially trained health care professional. Talk to your pediatrician regarding the use of this medicine in children. Special care may be needed. Overdosage: If you think you have taken too much of this medicine contact a poison control center or emergency room at once. NOTE: This medicine is only for you. Do not share this medicine with others. What if I miss a dose? It is important not to miss your dose. Call your doctor or health care professional if you are unable to keep an appointment. What may interact with this medicine? -cyclosporine -erythromycin -ketoconazole -medicines to increase blood counts like filgrastim, pegfilgrastim, sargramostim -vaccines Talk to your doctor or health care professional before taking any of these medicines: -acetaminophen -aspirin -ibuprofen -ketoprofen -naproxen This list may not describe all possible interactions. Give your health care provider a list of all the medicines, herbs, non-prescription drugs, or dietary supplements you use. Also tell them if you smoke, drink alcohol, or use illegal drugs. Some items may interact with your medicine. What should I watch for while using this medicine? Your condition will be monitored carefully while you are receiving this medicine. You will need important blood work done while you are taking this medicine. This drug may make you feel generally unwell. This is not uncommon, as chemotherapy can affect healthy cells as well as cancer cells. Report any side effects. Continue your course of treatment even though you  feel ill unless your doctor tells you to stop. In some cases, you may be given additional medicines to help with side effects. Follow all directions for  their use. Call your doctor or health care professional for advice if you get a fever, chills or sore throat, or other symptoms of a cold or flu. Do not treat yourself. This drug decreases your body's ability to fight infections. Try to avoid being around people who are sick. This medicine may increase your risk to bruise or bleed. Call your doctor or health care professional if you notice any unusual bleeding. Be careful brushing and flossing your teeth or using a toothpick because you may get an infection or bleed more easily. If you have any dental work done, tell your dentist you are receiving this medicine. Avoid taking products that contain aspirin, acetaminophen, ibuprofen, naproxen, or ketoprofen unless instructed by your doctor. These medicines may hide a fever. Do not become pregnant while taking this medicine. Women should inform their doctor if they wish to become pregnant or think they might be pregnant. There is a potential for serious side effects to an unborn child. Talk to your health care professional or pharmacist for more information. Do not breast-feed an infant while taking this medicine. What side effects may I notice from receiving this medicine? Side effects that you should report to your doctor or health care professional as soon as possible: -allergic reactions like skin rash, itching or hives, swelling of the face, lips, or tongue -low blood counts - This drug may decrease the number of white blood cells, red blood cells and platelets. You may be at increased risk for infections and bleeding. -signs of infection - fever or chills, cough, sore throat, pain or difficulty passing urine -signs of decreased platelets or bleeding - bruising, pinpoint red spots on the skin, black, tarry stools, nosebleeds -signs of decreased red blood cells - unusually weak or tired, fainting spells, lightheadedness -breathing problems -fast or irregular heartbeat -low blood pressure -mouth  sores -nausea and vomiting -pain, swelling, redness or irritation at the injection site -pain, tingling, numbness in the hands or feet -swelling of the ankle, feet, hands -weight gain Side effects that usually do not require medical attention (report to your prescriber or health care professional if they continue or are bothersome): -bone pain -complete hair loss including hair on your head, underarms, pubic hair, eyebrows, and eyelashes -diarrhea -excessive tearing -changes in the color of fingernails -loosening of the fingernails -nausea -muscle pain -red flush to skin -sweating -weak or tired This list may not describe all possible side effects. Call your doctor for medical advice about side effects. You may report side effects to FDA at 1-800-FDA-1088. Where should I keep my medicine? This drug is given in a hospital or clinic and will not be stored at home. NOTE: This sheet is a summary. It may not cover all possible information. If you have questions about this medicine, talk to your doctor, pharmacist, or health care provider.  2014, [employer]. (2007-12-13 11:52:10)   Carboplatin injection What is this medicine? CARBOPLATIN (KAR boe pla tin) is a chemotherapy drug. It targets fast dividing cells, like cancer cells, and causes these cells to die. This medicine is used to treat ovarian cancer and many other cancers. This medicine may be used for other purposes; ask your health care provider or pharmacist if you have questions. COMMON BRAND NAME(S): Paraplatin What should I tell my health care provider before I take this medicine?  They need to know if you have any of these conditions: -blood disorders -hearing problems -kidney disease -recent or ongoing radiation therapy -an unusual or allergic reaction to carboplatin, cisplatin, other chemotherapy, other medicines, foods, dyes, or preservatives -pregnant or trying to get pregnant -breast-feeding How should I  use this medicine? This drug is usually given as an infusion into a vein. It is administered in a hospital or clinic by a specially trained health care professional. Talk to your pediatrician regarding the use of this medicine in children. Special care may be needed. Overdosage: If you think you have taken too much of this medicine contact a poison control center or emergency room at once. NOTE: This medicine is only for you. Do not share this medicine with others. What if I miss a dose? It is important not to miss a dose. Call your doctor or health care professional if you are unable to keep an appointment. What may interact with this medicine? -medicines for seizures -medicines to increase blood counts like filgrastim, pegfilgrastim, sargramostim -some antibiotics like amikacin, gentamicin, neomycin, streptomycin, tobramycin -vaccines Talk to your doctor or health care professional before taking any of these medicines: -acetaminophen -aspirin -ibuprofen -ketoprofen -naproxen This list may not describe all possible interactions. Give your health care provider a list of all the medicines, herbs, non-prescription drugs, or dietary supplements you use. Also tell them if you smoke, drink alcohol, or use illegal drugs. Some items may interact with your medicine. What should I watch for while using this medicine? Your condition will be monitored carefully while you are receiving this medicine. You will need important blood work done while you are taking this medicine. This drug may make you feel generally unwell. This is not uncommon, as chemotherapy can affect healthy cells as well as cancer cells. Report any side effects. Continue your course of treatment even though you feel ill unless your doctor tells you to stop. In some cases, you may be given additional medicines to help with side effects. Follow all directions for their use. Call your doctor or health care professional for advice if you  get a fever, chills or sore throat, or other symptoms of a cold or flu. Do not treat yourself. This drug decreases your body's ability to fight infections. Try to avoid being around people who are sick. This medicine may increase your risk to bruise or bleed. Call your doctor or health care professional if you notice any unusual bleeding. Be careful brushing and flossing your teeth or using a toothpick because you may get an infection or bleed more easily. If you have any dental work done, tell your dentist you are receiving this medicine. Avoid taking products that contain aspirin, acetaminophen, ibuprofen, naproxen, or ketoprofen unless instructed by your doctor. These medicines may hide a fever. Do not become pregnant while taking this medicine. Women should inform their doctor if they wish to become pregnant or think they might be pregnant. There is a potential for serious side effects to an unborn child. Talk to your health care professional or pharmacist for more information. Do not breast-feed an infant while taking this medicine. What side effects may I notice from receiving this medicine? Side effects that you should report to your doctor or health care professional as soon as possible: -allergic reactions like skin rash, itching or hives, swelling of the face, lips, or tongue -signs of infection - fever or chills, cough, sore throat, pain or difficulty passing urine -signs of decreased platelets or bleeding -  bruising, pinpoint red spots on the skin, black, tarry stools, nosebleeds -signs of decreased red blood cells - unusually weak or tired, fainting spells, lightheadedness -breathing problems -changes in hearing -changes in vision -chest pain -high blood pressure -low blood counts - This drug may decrease the number of white blood cells, red blood cells and platelets. You may be at increased risk for infections and bleeding. -nausea and vomiting -pain, swelling, redness or irritation  at the injection site -pain, tingling, numbness in the hands or feet -problems with balance, talking, walking -trouble passing urine or change in the amount of urine Side effects that usually do not require medical attention (report to your doctor or health care professional if they continue or are bothersome): -hair loss -loss of appetite -metallic taste in the mouth or changes in taste This list may not describe all possible side effects. Call your doctor for medical advice about side effects. You may report side effects to FDA at 1-800-FDA-1088. Where should I keep my medicine? This drug is given in a hospital or clinic and will not be stored at home. NOTE: This sheet is a summary. It may not cover all possible information. If you have questions about this medicine, talk to your doctor, pharmacist, or health care provider.  2014, [employer] Standard. (2007-04-06 14:38:05)   Pegfilgrastim injection (Neulasta) What is this medicine? PEGFILGRASTIM (peg fil GRA stim) helps the body make more white blood cells. It is used to prevent infection in people with low amounts of white blood cells following cancer treatment. This medicine may be used for other purposes; ask your health care provider or pharmacist if you have questions. COMMON BRAND NAME(S): Neulasta What should I tell my health care provider before I take this medicine? They need to know if you have any of these conditions: -sickle cell disease -an unusual or allergic reaction to pegfilgrastim, filgrastim, E.coli protein, other medicines, foods, dyes, or preservatives -pregnant or trying to get pregnant -breast-feeding How should I use this medicine? This medicine is for injection under the skin. It is usually given by a health care professional in a hospital or clinic setting. If you get this medicine at home, you will be taught how to prepare and give this medicine. Do not shake this medicine. Use exactly as directed. Take  your medicine at regular intervals. Do not take your medicine more often than directed. It is important that you put your used needles and syringes in a special sharps container. Do not put them in a trash can. If you do not have a sharps container, call your pharmacist or healthcare provider to get one. Talk to your pediatrician regarding the use of this medicine in children. While this drug may be prescribed for children who weigh more than 45 kg for selected conditions, precautions do apply Overdosage: If you think you have taken too much of this medicine contact a poison control center or emergency room at once. NOTE: This medicine is only for you. Do not share this medicine with others. What if I miss a dose? If you miss a dose, take it as soon as you can. If it is almost time for your next dose, take only that dose. Do not take double or extra doses. What may interact with this medicine? -lithium -medicines for growth therapy This list may not describe all possible interactions. Give your health care provider a list of all the medicines, herbs, non-prescription drugs, or dietary supplements you use. Also tell them if you smoke,  drink alcohol, or use illegal drugs. Some items may interact with your medicine. What should I watch for while using this medicine? Visit your doctor for regular check ups. You will need important blood work done while you are taking this medicine. What side effects may I notice from receiving this medicine? Side effects that you should report to your doctor or health care professional as soon as possible: -allergic reactions like skin rash, itching or hives, swelling of the face, lips, or tongue -breathing problems -fever -pain, redness, or swelling where injected -shoulder pain -stomach or side pain Side effects that usually do not require medical attention (report to your doctor or health care professional if they continue or are bothersome): -aches,  pains -headache -loss of appetite -nausea, vomiting -unusually tired This list may not describe all possible side effects. Call your doctor for medical advice about side effects. You may report side effects to FDA at 1-800-FDA-1088. Where should I keep my medicine? Keep out of the reach of children. Store in a refrigerator between 2 and 8 degrees C (36 and 46 degrees F). Do not freeze. Keep in carton to protect from light. Throw away this medicine if it is left out of the refrigerator for more than 48 hours. Throw away any unused medicine after the expiration date. NOTE: This sheet is a summary. It may not cover all possible information. If you have questions about this medicine, talk to your doctor, pharmacist, or health care provider.  2014, [employer] Standard. (2007-08-02 15:41:44)

## 2013-02-24 NOTE — Progress Notes (Signed)
1145 - Pt reports tightness in chest under her breasts - "feels like when you have indigestion, have to burp".  No other symptoms.  Infusion paused 137 mL left to infuse, NS wide open, VS - see Epic.   1150 - pt up to restroom. NS to 500 ml/hr 1155 - Pt returned to chair - all symptom resolved. Reports she burped. 1200 - VS - see Epic, infusion restarted, NS stopped 1210 - BP recheck.  Pt reports she did take her BP meds today.

## 2013-02-25 ENCOUNTER — Telehealth: Payer: Self-pay | Admitting: *Deleted

## 2013-02-25 ENCOUNTER — Ambulatory Visit (HOSPITAL_COMMUNITY)
Admission: RE | Admit: 2013-02-25 | Discharge: 2013-02-25 | Disposition: A | Discharge status: Home / Self Care | Payer: Medicare Other | Source: Ambulatory Visit | Attending: Oncology | Admitting: Oncology

## 2013-02-25 ENCOUNTER — Encounter (HOSPITAL_COMMUNITY): Payer: Self-pay

## 2013-02-25 ENCOUNTER — Ambulatory Visit (HOSPITAL_BASED_OUTPATIENT_CLINIC_OR_DEPARTMENT_OTHER): Payer: Medicare Other

## 2013-02-25 VITALS — BP 154/84 | HR 82 | Temp 99.0°F

## 2013-02-25 DIAGNOSIS — C50411 Malignant neoplasm of upper-outer quadrant of right female breast: Secondary | ICD-10-CM

## 2013-02-25 DIAGNOSIS — D259 Leiomyoma of uterus, unspecified: Secondary | ICD-10-CM | POA: Insufficient documentation

## 2013-02-25 DIAGNOSIS — K8020 Calculus of gallbladder without cholecystitis without obstruction: Secondary | ICD-10-CM | POA: Insufficient documentation

## 2013-02-25 DIAGNOSIS — C50919 Malignant neoplasm of unspecified site of unspecified female breast: Secondary | ICD-10-CM | POA: Insufficient documentation

## 2013-02-25 DIAGNOSIS — N281 Cyst of kidney, acquired: Secondary | ICD-10-CM | POA: Insufficient documentation

## 2013-02-25 DIAGNOSIS — Z5189 Encounter for other specified aftercare: Secondary | ICD-10-CM

## 2013-02-25 DIAGNOSIS — I700 Atherosclerosis of aorta: Secondary | ICD-10-CM | POA: Insufficient documentation

## 2013-02-25 DIAGNOSIS — I2510 Atherosclerotic heart disease of native coronary artery without angina pectoris: Secondary | ICD-10-CM | POA: Insufficient documentation

## 2013-02-25 DIAGNOSIS — C50419 Malignant neoplasm of upper-outer quadrant of unspecified female breast: Secondary | ICD-10-CM

## 2013-02-25 DIAGNOSIS — Z79899 Other long term (current) drug therapy: Secondary | ICD-10-CM | POA: Insufficient documentation

## 2013-02-25 LAB — GLUCOSE, CAPILLARY: GLUCOSE-CAPILLARY: 189 mg/dL — AB (ref 70–99)

## 2013-02-25 MED ORDER — FLUDEOXYGLUCOSE F - 18 (FDG) INJECTION
12.2000 | Freq: Once | INTRAVENOUS | Status: AC | PRN
  Administered 2013-02-25: 12.2 via INTRAVENOUS

## 2013-02-25 MED ORDER — PEGFILGRASTIM INJECTION 6 MG/0.6ML
6.0000 mg | Freq: Once | SUBCUTANEOUS | Status: AC
  Administered 2013-02-25: 6 mg via SUBCUTANEOUS
  Filled 2013-02-25: qty 0.6

## 2013-02-25 MED ORDER — IOHEXOL 300 MG/ML  SOLN
100.0000 mL | Freq: Once | INTRAMUSCULAR | Status: AC | PRN
  Administered 2013-02-25: 100 mL via INTRAVENOUS

## 2013-02-25 NOTE — Telephone Encounter (Signed)
[name] here for Neulasta injection following 1st tch/perj chemotherapy.  States that she is doing well.  No nausea or vomiting.  States that she has been having some diarrhea, but feels that it is due to not eating today because of the scan she has to have today.  Told her she could use some Imodium if necessary and if it doesn't go away to call us.  She is drinking fluids and eating well.  All questions answered.  Knows to call if she has any problems or concerns.

## 2013-02-25 NOTE — Patient Instructions (Signed)

## 2013-02-27 ENCOUNTER — Encounter: Payer: Self-pay | Admitting: Oncology

## 2013-02-27 NOTE — Progress Notes (Signed)
OFFICE PROGRESS NOTE  CC  Lacey Battiest, MD [place] [address] Dr. Thea Silversmith  Dr. Erroll Luna  DIAGNOSIS: 58 year old female with new diagnosis of T1 N1 (stage II) right breast cancer.  STAGE:  Breast cancer of upper-outer quadrant of right female breast  Primary site: Breast (Right)  Staging method: AJCC 7th Edition  Clinical: Stage IIA (T1c, N1, cM0)  Summary: Stage IIA (T1c, N1, cM0)  PRIOR THERAPY: #1 Patient palpated a right breast mass. She had a mammogram that revealed a 1.8 cm lesion in the right breast. There was also noted to be enlarged lymph node by ultrasound. Patient could not tolerate MRI. 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 The biopsied right lymph node was positive for metastatic disease.    CURRENT THERAPY: Neoadjuvant cycle day 1 of TCH/Perjeta  INTERVAL HISTORY: [name] 58 y.o. female returns for follow up visit and to begin cycle 1 of her TCH/P chemotherapy. She had her port placed by Dr. Brantley Stage. She is doing well. She denies any fevers or chills no N/V/D, no headaches. Her echocardiogram was normal. Remainder of the 10 point ROS is normal.  MEDICAL HISTORY: Past Medical History  Diagnosis Date  . Hyperlipidemia   . Obesity   . Asthma     prn neb. and inhaler  . Osteoarthritis 2003    left knee  . Non-insulin dependent type 2 diabetes mellitus   . Graves' disease   . Ophthalmic manifestation of Graves disease   . Hypertension     on multiple meds., has been on med. > 14 yr.  . Breast cancer 01/2013    right    ALLERGIES:  is allergic to procardia.  MEDICATIONS:  Current Outpatient Prescriptions  Medication Sig Dispense Refill  . ACCU-CHEK SMARTVIEW test strip USE ONE STRIP TO CHECK GLUCOSE 4 TIMES DAILY  100 each  5  . albuterol (PROVENTIL HFA;VENTOLIN HFA) 108 (90 BASE) MCG/ACT inhaler Inhale 2 puffs into the lungs every 4 (four)  hours as needed.  18 g  2  . albuterol (PROVENTIL) (5 MG/ML) 0.5% nebulizer solution Take 2.5 mg by nebulization every 6 (six) hours as needed for wheezing or shortness of breath.      Marland Kitchen amLODipine (NORVASC) 10 MG tablet TAKE ONE TABLET BY MOUTH ONCE DAILY.  90 tablet  1  . Artificial Tear Ointment (ARTIFICIAL TEARS) ointment Place 1 drop into both eyes as needed.       Marland Kitchen aspirin 81 MG tablet Take 81 mg by mouth daily.      . benazepril (LOTENSIN) 40 MG tablet TAKE ONE TABLET BY MOUTH ONCE DAILY.  30 tablet  5  . Biotin 1000 MCG tablet Take 1,000 mcg by mouth daily.      . cloNIDine (CATAPRES - DOSED IN MG/24 HR) 0.2 mg/24hr patch Place 1 patch (0.2 mg total) onto the skin once a week.  4 patch  2  . cycloSPORINE (RESTASIS) 0.05 % ophthalmic emulsion Place 1 drop into both eyes 2 (two) times daily.       . hydrochlorothiazide (HYDRODIURIL) 25 MG tablet TAKE ONE TABLET BY MOUTH ONCE DAILY.  90 tablet  1  . labetalol (NORMODYNE) 300 MG tablet TAKE (1) TABLET BY MOUTH TWICE DAILY.  60 tablet  3  . levothyroxine (SYNTHROID, LEVOTHROID) 100 MCG tablet Take 100 mcg by mouth every morning.      . lidocaine-prilocaine (EMLA) cream Apply  1 application topically as needed.  30 g  0  . metFORMIN (GLUCOPHAGE) 500 MG tablet Take 1 tablet (500 mg total) by mouth 2 (two) times daily with a meal.  60 tablet  6  . oxyCODONE-acetaminophen (ROXICET) 5-325 MG per tablet Take 1-2 tablets by mouth every 4 (four) hours as needed.  30 tablet  0  . pravastatin (PRAVACHOL) 80 MG tablet Take 1 tablet (80 mg total) by mouth at bedtime.  30 tablet  6  . dexamethasone (DECADRON) 4 MG tablet       . prochlorperazine (COMPAZINE) 10 MG tablet        No current facility-administered medications for this visit.    SURGICAL HISTORY:  Past Surgical History  Procedure Laterality Date  . Total knee arthroplasty Left 2003  . Total thyroidectomy    . Colonoscopy  2007  . Tubal ligation  1985  . Colonoscopy w/ polypectomy  2009   . Portacath placement Right 02/17/2013    Procedure: INSERTION PORT-A-CATH;  Surgeon: Joyice Faster. Cornett, MD;  Location: [facility];  Service: General;  Laterality: Right;    REVIEW OF SYSTEMS:  Pertinent items are noted in HPI.     PHYSICAL EXAMINATION: Blood pressure 165/89, pulse 89, temperature 98.2 F (36.8 C), temperature source Oral, resp. rate 20, height 5' 4.5" (1.638 m), weight 269 lb 8 oz (122.244 kg). Body mass index is 45.56 kg/(m^2). ECOG PERFORMANCE STATUS: 1 - Symptomatic but completely ambulatory   General appearance: alert, cooperative and appears older than stated age Resp: clear to auscultation bilaterally Cardio: regular rate and rhythm GI: soft, non-tender; bowel sounds normal; no masses,  no organomegaly Extremities: extremities normal, atraumatic, no cyanosis or edema Neurologic: Grossly normal Breasts: left breast normal without mass, skin or nipple changes or axillary nodes, abnormal mass palpable in the right breast. No tenderness or skin changes.   LABORATORY DATA: Lab Results  Component Value Date   WBC 6.3 02/22/2013   HGB 11.4* 02/22/2013   HCT 34.8 02/22/2013   MCV 80.9 02/22/2013   PLT 217 02/22/2013      Chemistry      Component Value Date/Time   NA 144 02/22/2013 1126   NA 141 02/15/2013 1445   K 3.7 02/22/2013 1126   K 4.4 02/15/2013 1445   CL 98 02/15/2013 1445   CO2 28 02/22/2013 1126   CO2 26 02/15/2013 1445   BUN 12.9 02/22/2013 1126   BUN 11 02/15/2013 1445   CREATININE 1.0 02/22/2013 1126   CREATININE 0.76 02/15/2013 1445   CREATININE 0.96 10/06/2012 0830      Component Value Date/Time   CALCIUM 9.7 02/22/2013 1126   CALCIUM 9.4 02/15/2013 1445   ALKPHOS 79 02/22/2013 1126   ALKPHOS 90 02/15/2013 1445   AST 14 02/22/2013 1126   AST 18 02/15/2013 1445   ALT 11 02/22/2013 1126   ALT 11 02/15/2013 1445   BILITOT 0.49 02/22/2013 1126   BILITOT 0.3 02/15/2013 1445       RADIOGRAPHIC STUDIES:  Chest 2 View  02/15/2013   CLINICAL DATA:   Preop port placement.  Breast cancer.  EXAM: CHEST  2 VIEW  COMPARISON:  None.  FINDINGS: The heart size and mediastinal contours are within normal limits. Both lungs are clear. The visualized skeletal structures are unremarkable.  Surgical clips in the thyroid bilaterally. Thoracic osteophytes diffusely.  IMPRESSION: No active cardiopulmonary disease.   Electronically Signed   By: Franchot Gallo M.D.   On: 02/15/2013  14:10   Ct Chest W Contrast  02/25/2013   CLINICAL DATA:  New diagnosis of right breast cancer. Chemotherapy in progress.  EXAM: CT CHEST, ABDOMEN, AND PELVIS WITH CONTRAST  TECHNIQUE: Multidetector CT imaging of the chest, abdomen and pelvis was performed following the standard protocol during bolus administration of intravenous contrast.  CONTRAST:  127m OMNIPAQUE IOHEXOL 300 MG/ML  SOLN  COMPARISON:  PET-CT performed today.  FINDINGS: CT CHEST FINDINGS  There is a biopsy clip within a 1.6 cm mass laterally in the right breast on image 19. Small adjacent inferior right axillary lymph nodes are not pathologically enlarged, although are mildly hypermetabolic on today's PET-CT. These include a 10 mm node on image 17 and an 8 mm node on image 19.  There are no enlarged internal mammary, mediastinal or hilar lymph nodes. Postsurgical changes are noted at the thoracic inlet consistent with prior thyroid resection. There is a right IJ central venous catheter with its tip at the SVC right atrial junction.  There is mild atherosclerosis of the aorta and coronary arteries. The heart size is normal. There is no pleural or pericardial effusion.  The lungs are clear.  There are no worrisome osseous findings.  CT ABDOMEN AND PELVIS FINDINGS  The liver appears normal without evidence of metastatic disease. There are small calcified gallstones within the gallbladder lumen. The spleen, pancreas and adrenal glands appear normal. There is a 1.5 cm parapelvic cyst inferiorly in the left kidney. The right kidney  appears normal. There is no hydronephrosis.  The stomach, small bowel, appendix and colon demonstrate no significant findings. There are no enlarged abdominal pelvic lymph nodes. There is mild atherosclerosis of the aorta and iliac arteries.  The uterus is lobulated with small calcifications consistent with degenerated fibroids. There is no adnexal mass. The bladder appears normal. There is a large phlebolith or calcified lymph node inferiorly in the left pelvis.  There are no worrisome osseous findings. There are degenerative changes throughout the spine. In addition, there are degenerative changes at the hips and sacroiliac joints bilaterally.  IMPRESSION: 1. Biopsy proven right breast cancer with probable small adjacent nodal metastases. 2. No distant/hematogenous metastatic disease identified. 3. Cholelithiasis, left renal parapelvic cyst and uterine fibroids noted. 4. Degenerative changes in the spine, hips and sacroiliac joints.   Electronically Signed   By: BCamie PatienceM.D.   On: 02/25/2013 13:05   Ct Abdomen Pelvis W Contrast  02/25/2013   CLINICAL DATA:  New diagnosis of right breast cancer. Chemotherapy in progress.  EXAM: CT CHEST, ABDOMEN, AND PELVIS WITH CONTRAST  TECHNIQUE: Multidetector CT imaging of the chest, abdomen and pelvis was performed following the standard protocol during bolus administration of intravenous contrast.  CONTRAST:  1027mOMNIPAQUE IOHEXOL 300 MG/ML  SOLN  COMPARISON:  PET-CT performed today.  FINDINGS: CT CHEST FINDINGS  There is a biopsy clip within a 1.6 cm mass laterally in the right breast on image 19. Small adjacent inferior right axillary lymph nodes are not pathologically enlarged, although are mildly hypermetabolic on today's PET-CT. These include a 10 mm node on image 17 and an 8 mm node on image 19.  There are no enlarged internal mammary, mediastinal or hilar lymph nodes. Postsurgical changes are noted at the thoracic inlet consistent with prior thyroid  resection. There is a right IJ central venous catheter with its tip at the SVC right atrial junction.  There is mild atherosclerosis of the aorta and coronary arteries. The heart size is normal. There  is no pleural or pericardial effusion.  The lungs are clear.  There are no worrisome osseous findings.  CT ABDOMEN AND PELVIS FINDINGS  The liver appears normal without evidence of metastatic disease. There are small calcified gallstones within the gallbladder lumen. The spleen, pancreas and adrenal glands appear normal. There is a 1.5 cm parapelvic cyst inferiorly in the left kidney. The right kidney appears normal. There is no hydronephrosis.  The stomach, small bowel, appendix and colon demonstrate no significant findings. There are no enlarged abdominal pelvic lymph nodes. There is mild atherosclerosis of the aorta and iliac arteries.  The uterus is lobulated with small calcifications consistent with degenerated fibroids. There is no adnexal mass. The bladder appears normal. There is a large phlebolith or calcified lymph node inferiorly in the left pelvis.  There are no worrisome osseous findings. There are degenerative changes throughout the spine. In addition, there are degenerative changes at the hips and sacroiliac joints bilaterally.  IMPRESSION: 1. Biopsy proven right breast cancer with probable small adjacent nodal metastases. 2. No distant/hematogenous metastatic disease identified. 3. Cholelithiasis, left renal parapelvic cyst and uterine fibroids noted. 4. Degenerative changes in the spine, hips and sacroiliac joints.   Electronically Signed   By: Camie Patience M.D.   On: 02/25/2013 13:05   Nm Pet Image Initial (pi) Skull Base To Thigh  02/25/2013   CLINICAL DATA:  Initial treatment strategy for HER-2 positive, node positive right breast cancer.  EXAM: NUCLEAR MEDICINE PET SKULL BASE TO THIGH  FASTING BLOOD GLUCOSE:  Value: 189 mg/dl  TECHNIQUE: 12.2 mCi F-18 FDG was injected intravenously. Full-ring  PET imaging was performed from the skull base to thigh after the radiotracer. CT data was obtained and used for attenuation correction and anatomic localization.  COMPARISON:  [place] CORE BIOPSY dated 01/26/2013; CT CHEST W/CM dated 02/25/2013; CT ABD/PELVIS W CM dated 02/25/2013  FINDINGS: NECK  No hypermetabolic cervical lymph nodes are identified.There are no lesions of the pharyngeal mucosal space.  CHEST  The approximately 1.7 cm mass laterally in the right breast on image 67 is hypermetabolic with SUV max 81.77. Small nodes inferiorly in the right axilla are also mildly hypermetabolic with an SUV max of 2.67. There are no hypermetabolic internal mammary, mediastinal or hilar lymph nodes. There is no hypermetabolic pulmonary activity. CT findings are deferred to the diagnostic study of the same date.  ABDOMEN/PELVIS  There is no hypermetabolic activity within the liver, adrenal glands, spleen or pancreas. There is no hypermetabolic nodal activity. CT findings are deferred to the diagnostic study of the same date.  SKELETON  There is no hypermetabolic activity to suggest osseous metastatic disease.  IMPRESSION: 1. The right lateral breast cancer is hypermetabolic. There are small right axillary lymph nodes which are also hypermetabolic, suspicious for nodal metastases. 2. No evidence of distant/hematogenous metastatic disease.   Electronically Signed   By: Camie Patience M.D.   On: 02/25/2013 13:06   Dg Chest Port 1 View  02/17/2013   CLINICAL DATA:  Port-A-Cath placement.  EXAM: PORTABLE CHEST - 1 VIEW  COMPARISON:  02/15/2013  FINDINGS: Right anterior chest wall, internal jugular, Port-A-Cath has its tip in the mid superior vena cava.  No pneumothorax.  Cardiac silhouette is normal in size. Normal mediastinal and hilar contours. Mild linear atelectasis in the left mid lung. Lungs are otherwise clear. Stable changes from previous thyroid surgery.  IMPRESSION: Right anterior chest wall Port-A-Cath tip is in the mid  superior vena cava. No pneumothorax.  Electronically Signed   By: Lajean Manes M.D.   On: 02/17/2013 09:21   Dg Fluoro Guide Cv Line-no Report  02/17/2013   CLINICAL DATA: port placement   FLOURO GUIDE CV LINE  Fluoroscopy was utilized by the requesting physician.  No radiographic  interpretation.     ASSESSMENT/PLAN:  58 year old female with  #1. Stage IIA (T1N1) right breast cancer for neoadjuvant chemotherapy today, cycle 1 day of therapy with TCH/perjeta today. Patient has had chemotherapy teaching, understands risks and benefits and rational for chemotherapy.  #2 she will also recive neulasta on day 2  #3 Diabetes: controlled and she is followed by her PCP  4. anit-emetics were given to patient and she is counsled about how to take them  #5 Follow up: 1 week with labs and MD visit    All questions were answered. The patient knows to call the clinic with any problems, questions or concerns. We can certainly see the patient much sooner if necessary.  I spent 20 minutes counseling the patient face to face. The total time spent in the appointment was 30 minutes.    Marcy Panning, MD Medical/Oncology [facility] [phone] (beeper) [phone] (Office)  02/27/2013, 6:40 PM

## 2013-03-01 ENCOUNTER — Other Ambulatory Visit: Payer: Medicare Other

## 2013-03-01 ENCOUNTER — Telehealth: Payer: Self-pay | Admitting: Oncology

## 2013-03-01 ENCOUNTER — Ambulatory Visit: Payer: Medicare Other | Admitting: Oncology

## 2013-03-01 ENCOUNTER — Telehealth: Payer: Self-pay | Admitting: *Deleted

## 2013-03-01 NOTE — Telephone Encounter (Signed)
Message copied by Hebert Soho on Tue Mar 01, 2013 10:50 AM ------      Message from: Deatra Robinson      Created: Sun Feb 27, 2013 10:07 PM       PET scan no cancer in other organs ------

## 2013-03-01 NOTE — Telephone Encounter (Signed)
, °

## 2013-03-01 NOTE — Telephone Encounter (Signed)
As noted below by Dr. Humphrey Rolls, I informed patient that PET scan results showed no cancer in other organs. Patient verbalized understanding.

## 2013-03-02 ENCOUNTER — Other Ambulatory Visit: Payer: Self-pay | Admitting: Family Medicine

## 2013-03-04 ENCOUNTER — Ambulatory Visit (HOSPITAL_BASED_OUTPATIENT_CLINIC_OR_DEPARTMENT_OTHER): Payer: Medicare Other | Admitting: Hematology and Oncology

## 2013-03-04 ENCOUNTER — Telehealth: Payer: Self-pay | Admitting: Oncology

## 2013-03-04 ENCOUNTER — Other Ambulatory Visit (HOSPITAL_BASED_OUTPATIENT_CLINIC_OR_DEPARTMENT_OTHER): Payer: Medicare Other

## 2013-03-04 VITALS — BP 110/73 | HR 106 | Temp 98.8°F | Resp 18 | Ht 64.5 in | Wt 260.9 lb

## 2013-03-04 DIAGNOSIS — C50411 Malignant neoplasm of upper-outer quadrant of right female breast: Secondary | ICD-10-CM

## 2013-03-04 DIAGNOSIS — C50911 Malignant neoplasm of unspecified site of right female breast: Secondary | ICD-10-CM

## 2013-03-04 DIAGNOSIS — C50419 Malignant neoplasm of upper-outer quadrant of unspecified female breast: Secondary | ICD-10-CM

## 2013-03-04 DIAGNOSIS — C773 Secondary and unspecified malignant neoplasm of axilla and upper limb lymph nodes: Secondary | ICD-10-CM

## 2013-03-04 DIAGNOSIS — E119 Type 2 diabetes mellitus without complications: Secondary | ICD-10-CM

## 2013-03-04 DIAGNOSIS — I10 Essential (primary) hypertension: Secondary | ICD-10-CM

## 2013-03-04 DIAGNOSIS — R110 Nausea: Secondary | ICD-10-CM

## 2013-03-04 LAB — CBC WITH DIFFERENTIAL/PLATELET
BASO%: 0.1 % (ref 0.0–2.0)
Basophils Absolute: 0 10*3/uL (ref 0.0–0.1)
EOS%: 0.2 % (ref 0.0–7.0)
Eosinophils Absolute: 0 10*3/uL (ref 0.0–0.5)
HCT: 37.7 % (ref 34.8–46.6)
HGB: 12.1 g/dL (ref 11.6–15.9)
LYMPH#: 2.3 10*3/uL (ref 0.9–3.3)
LYMPH%: 14.6 % (ref 14.0–49.7)
MCH: 26.6 pg (ref 25.1–34.0)
MCHC: 32.2 g/dL (ref 31.5–36.0)
MCV: 82.5 fL (ref 79.5–101.0)
MONO#: 1.8 10*3/uL — ABNORMAL HIGH (ref 0.1–0.9)
MONO%: 11.3 % (ref 0.0–14.0)
NEUT%: 73.8 % (ref 38.4–76.8)
NEUTROS ABS: 11.8 10*3/uL — AB (ref 1.5–6.5)
Platelets: 171 10*3/uL (ref 145–400)
RBC: 4.57 10*6/uL (ref 3.70–5.45)
RDW: 16.8 % — AB (ref 11.2–14.5)
WBC: 15.9 10*3/uL — ABNORMAL HIGH (ref 3.9–10.3)

## 2013-03-04 LAB — COMPREHENSIVE METABOLIC PANEL (CC13)
ALK PHOS: 140 U/L (ref 40–150)
ALT: 15 U/L (ref 0–55)
AST: 18 U/L (ref 5–34)
Albumin: 3.6 g/dL (ref 3.5–5.0)
Anion Gap: 11 mEq/L (ref 3–11)
BUN: 16.4 mg/dL (ref 7.0–26.0)
CALCIUM: 9 mg/dL (ref 8.4–10.4)
CHLORIDE: 101 meq/L (ref 98–109)
CO2: 25 mEq/L (ref 22–29)
Creatinine: 1.4 mg/dL — ABNORMAL HIGH (ref 0.6–1.1)
Glucose: 414 mg/dl — ABNORMAL HIGH (ref 70–140)
POTASSIUM: 4.4 meq/L (ref 3.5–5.1)
SODIUM: 137 meq/L (ref 136–145)
TOTAL PROTEIN: 6.6 g/dL (ref 6.4–8.3)
Total Bilirubin: 0.33 mg/dL (ref 0.20–1.20)

## 2013-03-04 MED ORDER — OMEPRAZOLE 20 MG PO CPDR: 20.0000 mg | DELAYED_RELEASE_CAPSULE | Freq: Every day | ORAL | Status: DC

## 2013-03-04 NOTE — Telephone Encounter (Signed)
, °

## 2013-03-04 NOTE — Progress Notes (Signed)
OFFICE PROGRESS NOTE  CC  Rubbie Battiest, MD [place] [address] Dr. Thea Silversmith  Dr. Erroll Luna  DIAGNOSIS: 58 year old female with new diagnosis of T1 N1 (stage II) right breast cancer.  STAGE:  Breast cancer of upper-outer quadrant of right female breast  Primary site: Breast (Right)  Staging method: AJCC 7th Edition  Clinical: Stage IIA (T1c, N1, cM0)  Summary: Stage IIA (T1c, N1, cM0)  PRIOR THERAPY: #1 Patient palpated a right breast mass. She had a mammogram that revealed a 1.8 cm lesion in the right breast. There was also noted to be enlarged lymph node by ultrasound. Patient could not tolerate MRI. 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 The biopsied right lymph node was positive for metastatic disease.    CURRENT THERAPY: Status post Neoadjuvant cycle day 1 of TCH/Perjeta started on 02/22/2013 INTERVAL HISTORY: LUDA CHARBONNEAU 58 y.o. female returns for follow up visit after completion of cycle 1 chemotherapy with TCH/P. she i has tolerated cycle #1 chemotherapy very well. She does complain of intermittent heartburn and she says omeprazole helped in the past. She also complains of mild fatigue She denies any fever, chills, chest pain, blood in the stool blood in the urine, blurred vision, headaches, shortness of breath, dizziness. She is eating well and drinking enough fluids    MEDICAL HISTORY: Past Medical History  Diagnosis Date  . Hyperlipidemia   . Obesity   . Asthma     prn neb. and inhaler  . Osteoarthritis 2003    left knee  . Non-insulin dependent type 2 diabetes mellitus   . Graves' disease   . Ophthalmic manifestation of Graves disease   . Hypertension     on multiple meds., has been on med. > 14 yr.  . Breast cancer 01/2013    right    ALLERGIES:  is allergic to procardia.  MEDICATIONS:  Current Outpatient Prescriptions  Medication Sig Dispense  Refill  . ACCU-CHEK SMARTVIEW test strip USE ONE STRIP TO CHECK GLUCOSE 4 TIMES DAILY  100 each  5  . albuterol (PROVENTIL HFA;VENTOLIN HFA) 108 (90 BASE) MCG/ACT inhaler Inhale 2 puffs into the lungs every 4 (four) hours as needed.  18 g  2  . albuterol (PROVENTIL) (5 MG/ML) 0.5% nebulizer solution Take 2.5 mg by nebulization every 6 (six) hours as needed for wheezing or shortness of breath.      Marland Kitchen amLODipine (NORVASC) 10 MG tablet TAKE ONE TABLET BY MOUTH ONCE DAILY.  90 tablet  1  . Artificial Tear Ointment (ARTIFICIAL TEARS) ointment Place 1 drop into both eyes as needed.       Marland Kitchen aspirin 81 MG tablet Take 81 mg by mouth daily.      . benazepril (LOTENSIN) 40 MG tablet TAKE ONE TABLET BY MOUTH ONCE DAILY.  30 tablet  5  . Biotin 1000 MCG tablet Take 1,000 mcg by mouth daily.      . cloNIDine (CATAPRES - DOSED IN MG/24 HR) 0.2 mg/24hr patch Place 1 patch (0.2 mg total) onto the skin once a week.  4 patch  2  . cycloSPORINE (RESTASIS) 0.05 % ophthalmic emulsion Place 1 drop into both eyes 2 (two) times daily.       Marland Kitchen dexamethasone (DECADRON) 4 MG tablet       . hydrochlorothiazide (HYDRODIURIL) 25 MG tablet TAKE ONE TABLET BY MOUTH ONCE DAILY.  30 tablet  0  .  labetalol (NORMODYNE) 300 MG tablet TAKE (1) TABLET BY MOUTH TWICE DAILY.  60 tablet  3  . LANTUS SOLOSTAR 100 UNIT/ML Solostar Pen       . levothyroxine (SYNTHROID, LEVOTHROID) 100 MCG tablet Take 100 mcg by mouth every morning.      . lidocaine-prilocaine (EMLA) cream Apply 1 application topically as needed.  30 g  0  . metFORMIN (GLUCOPHAGE) 500 MG tablet Take 1 tablet (500 mg total) by mouth 2 (two) times daily with a meal.  60 tablet  6  . oxyCODONE-acetaminophen (ROXICET) 5-325 MG per tablet Take 1-2 tablets by mouth every 4 (four) hours as needed.  30 tablet  0  . pravastatin (PRAVACHOL) 80 MG tablet Take 1 tablet (80 mg total) by mouth at bedtime.  30 tablet  6  . prochlorperazine (COMPAZINE) 10 MG tablet        No current  facility-administered medications for this visit.    SURGICAL HISTORY:  Past Surgical History  Procedure Laterality Date  . Total knee arthroplasty Left 2003  . Total thyroidectomy    . Colonoscopy  2007  . Tubal ligation  1985  . Colonoscopy w/ polypectomy  2009  . Portacath placement Right 02/17/2013    Procedure: INSERTION PORT-A-CATH;  Surgeon: Joyice Faster. Cornett, MD;  Location: [facility];  Service: General;  Laterality: Right;    REVIEW OF SYSTEMS:  Pertinent items are noted in HPI.     PHYSICAL EXAMINATION: Blood pressure 110/73, pulse 106, temperature 98.8 F (37.1 C), temperature source Oral, resp. rate 18, height 5' 4.5" (1.638 m), weight 260 lb 14.4 oz (118.343 kg). Body mass index is 44.11 kg/(m^2). ECOG PERFORMANCE STATUS: 1 - Symptomatic but completely ambulatory HEENT PERRLA, sclera anicteric, conjunctiva no pallor, neck supple no JVD, no thyromegaly   General appearance: alert, cooperative and appears older than stated age Resp: clear to auscultation bilaterally Cardio: regular rate and rhythm GI: soft, non-tender; bowel sounds normal; no masses,  no organomegaly Extremities: extremities normal, atraumatic, no cyanosis or edema Neurologic: Grossly normal Breasts: left breast normal without mass, skin or nipple changes or axillary nodes, abnormal mass palpable in the right breast. No tenderness or skin changes.   LABORATORY DATA: Lab Results  Component Value Date   WBC 15.9* 03/04/2013   HGB 12.1 03/04/2013   HCT 37.7 03/04/2013   MCV 82.5 03/04/2013   PLT 171 03/04/2013      Chemistry      Component Value Date/Time   NA 144 02/22/2013 1126   NA 141 02/15/2013 1445   K 3.7 02/22/2013 1126   K 4.4 02/15/2013 1445   CL 98 02/15/2013 1445   CO2 28 02/22/2013 1126   CO2 26 02/15/2013 1445   BUN 12.9 02/22/2013 1126   BUN 11 02/15/2013 1445   CREATININE 1.0 02/22/2013 1126   CREATININE 0.76 02/15/2013 1445   CREATININE 0.96 10/06/2012 0830      Component  Value Date/Time   CALCIUM 9.7 02/22/2013 1126   CALCIUM 9.4 02/15/2013 1445   ALKPHOS 79 02/22/2013 1126   ALKPHOS 90 02/15/2013 1445   AST 14 02/22/2013 1126   AST 18 02/15/2013 1445   ALT 11 02/22/2013 1126   ALT 11 02/15/2013 1445   BILITOT 0.49 02/22/2013 1126   BILITOT 0.3 02/15/2013 1445       RADIOGRAPHIC STUDIES:  Chest 2 View  02/15/2013   CLINICAL DATA:  Preop port placement.  Breast cancer.  EXAM: CHEST  2 VIEW  COMPARISON:  None.  FINDINGS: The heart size and mediastinal contours are within normal limits. Both lungs are clear. The visualized skeletal structures are unremarkable.  Surgical clips in the thyroid bilaterally. Thoracic osteophytes diffusely.  IMPRESSION: No active cardiopulmonary disease.   Electronically Signed   By: Franchot Gallo M.D.   On: 02/15/2013 14:10   Ct Chest W Contrast  02/25/2013   CLINICAL DATA:  New diagnosis of right breast cancer. Chemotherapy in progress.  EXAM: CT CHEST, ABDOMEN, AND PELVIS WITH CONTRAST  TECHNIQUE: Multidetector CT imaging of the chest, abdomen and pelvis was performed following the standard protocol during bolus administration of intravenous contrast.  CONTRAST:  132m OMNIPAQUE IOHEXOL 300 MG/ML  SOLN  COMPARISON:  PET-CT performed today.  FINDINGS: CT CHEST FINDINGS  There is a biopsy clip within a 1.6 cm mass laterally in the right breast on image 19. Small adjacent inferior right axillary lymph nodes are not pathologically enlarged, although are mildly hypermetabolic on today's PET-CT. These include a 10 mm node on image 17 and an 8 mm node on image 19.  There are no enlarged internal mammary, mediastinal or hilar lymph nodes. Postsurgical changes are noted at the thoracic inlet consistent with prior thyroid resection. There is a right IJ central venous catheter with its tip at the SVC right atrial junction.  There is mild atherosclerosis of the aorta and coronary arteries. The heart size is normal. There is no pleural or pericardial effusion.   The lungs are clear.  There are no worrisome osseous findings.  CT ABDOMEN AND PELVIS FINDINGS  The liver appears normal without evidence of metastatic disease. There are small calcified gallstones within the gallbladder lumen. The spleen, pancreas and adrenal glands appear normal. There is a 1.5 cm parapelvic cyst inferiorly in the left kidney. The right kidney appears normal. There is no hydronephrosis.  The stomach, small bowel, appendix and colon demonstrate no significant findings. There are no enlarged abdominal pelvic lymph nodes. There is mild atherosclerosis of the aorta and iliac arteries.  The uterus is lobulated with small calcifications consistent with degenerated fibroids. There is no adnexal mass. The bladder appears normal. There is a large phlebolith or calcified lymph node inferiorly in the left pelvis.  There are no worrisome osseous findings. There are degenerative changes throughout the spine. In addition, there are degenerative changes at the hips and sacroiliac joints bilaterally.  IMPRESSION: 1. Biopsy proven right breast cancer with probable small adjacent nodal metastases. 2. No distant/hematogenous metastatic disease identified. 3. Cholelithiasis, left renal parapelvic cyst and uterine fibroids noted. 4. Degenerative changes in the spine, hips and sacroiliac joints.   Electronically Signed   By: BCamie PatienceM.D.   On: 02/25/2013 13:05   Ct Abdomen Pelvis W Contrast  02/25/2013   CLINICAL DATA:  New diagnosis of right breast cancer. Chemotherapy in progress.  EXAM: CT CHEST, ABDOMEN, AND PELVIS WITH CONTRAST  TECHNIQUE: Multidetector CT imaging of the chest, abdomen and pelvis was performed following the standard protocol during bolus administration of intravenous contrast.  CONTRAST:  1054mOMNIPAQUE IOHEXOL 300 MG/ML  SOLN  COMPARISON:  PET-CT performed today.  FINDINGS: CT CHEST FINDINGS  There is a biopsy clip within a 1.6 cm mass laterally in the right breast on image 19. Small  adjacent inferior right axillary lymph nodes are not pathologically enlarged, although are mildly hypermetabolic on today's PET-CT. These include a 10 mm node on image 17 and an 8 mm node on image 19.  There are  no enlarged internal mammary, mediastinal or hilar lymph nodes. Postsurgical changes are noted at the thoracic inlet consistent with prior thyroid resection. There is a right IJ central venous catheter with its tip at the SVC right atrial junction.  There is mild atherosclerosis of the aorta and coronary arteries. The heart size is normal. There is no pleural or pericardial effusion.  The lungs are clear.  There are no worrisome osseous findings.  CT ABDOMEN AND PELVIS FINDINGS  The liver appears normal without evidence of metastatic disease. There are small calcified gallstones within the gallbladder lumen. The spleen, pancreas and adrenal glands appear normal. There is a 1.5 cm parapelvic cyst inferiorly in the left kidney. The right kidney appears normal. There is no hydronephrosis.  The stomach, small bowel, appendix and colon demonstrate no significant findings. There are no enlarged abdominal pelvic lymph nodes. There is mild atherosclerosis of the aorta and iliac arteries.  The uterus is lobulated with small calcifications consistent with degenerated fibroids. There is no adnexal mass. The bladder appears normal. There is a large phlebolith or calcified lymph node inferiorly in the left pelvis.  There are no worrisome osseous findings. There are degenerative changes throughout the spine. In addition, there are degenerative changes at the hips and sacroiliac joints bilaterally.  IMPRESSION: 1. Biopsy proven right breast cancer with probable small adjacent nodal metastases. 2. No distant/hematogenous metastatic disease identified. 3. Cholelithiasis, left renal parapelvic cyst and uterine fibroids noted. 4. Degenerative changes in the spine, hips and sacroiliac joints.   Electronically Signed   By: Camie Patience M.D.   On: 02/25/2013 13:05   Nm Pet Image Initial (pi) Skull Base To Thigh  02/25/2013   CLINICAL DATA:  Initial treatment strategy for HER-2 positive, node positive right breast cancer.  EXAM: NUCLEAR MEDICINE PET SKULL BASE TO THIGH  FASTING BLOOD GLUCOSE:  Value: 189 mg/dl  TECHNIQUE: 12.2 mCi F-18 FDG was injected intravenously. Full-ring PET imaging was performed from the skull base to thigh after the radiotracer. CT data was obtained and used for attenuation correction and anatomic localization.  COMPARISON:  [place] CORE BIOPSY dated 01/26/2013; CT CHEST W/CM dated 02/25/2013; CT ABD/PELVIS W CM dated 02/25/2013  FINDINGS: NECK  No hypermetabolic cervical lymph nodes are identified.There are no lesions of the pharyngeal mucosal space.  CHEST  The approximately 1.7 cm mass laterally in the right breast on image 67 is hypermetabolic with SUV max 50.03. Small nodes inferiorly in the right axilla are also mildly hypermetabolic with an SUV max of 2.67. There are no hypermetabolic internal mammary, mediastinal or hilar lymph nodes. There is no hypermetabolic pulmonary activity. CT findings are deferred to the diagnostic study of the same date.  ABDOMEN/PELVIS  There is no hypermetabolic activity within the liver, adrenal glands, spleen or pancreas. There is no hypermetabolic nodal activity. CT findings are deferred to the diagnostic study of the same date.  SKELETON  There is no hypermetabolic activity to suggest osseous metastatic disease.  IMPRESSION: 1. The right lateral breast cancer is hypermetabolic. There are small right axillary lymph nodes which are also hypermetabolic, suspicious for nodal metastases. 2. No evidence of distant/hematogenous metastatic disease.   Electronically Signed   By: Camie Patience M.D.   On: 02/25/2013 13:06   Dg Chest Port 1 View  02/17/2013   CLINICAL DATA:  Port-A-Cath placement.  EXAM: PORTABLE CHEST - 1 VIEW  COMPARISON:  02/15/2013  FINDINGS: Right anterior chest wall,  internal jugular, Port-A-Cath has its tip in  the mid superior vena cava.  No pneumothorax.  Cardiac silhouette is normal in size. Normal mediastinal and hilar contours. Mild linear atelectasis in the left mid lung. Lungs are otherwise clear. Stable changes from previous thyroid surgery.  IMPRESSION: Right anterior chest wall Port-A-Cath tip is in the mid superior vena cava. No pneumothorax.   Electronically Signed   By: Lajean Manes M.D.   On: 02/17/2013 09:21   Dg Fluoro Guide Cv Line-no Report  02/17/2013   CLINICAL DATA: port placement   FLOURO GUIDE CV LINE  Fluoroscopy was utilized by the requesting physician.  No radiographic  interpretation.     ASSESSMENT/PLAN:  58 year old female with  #1. Stage IIA (T1N1) right breast cancer for neoadjuvant chemotherapy  cycle 1 day of therapy with TCH/perjeta started on 02/22/2013. Patient has had chemotherapy teaching, understands risks and benefits and rational for chemotherapy.  #2 I have reviewed the PET scan reports and also CAT scan of the abdomen and pelvis and chest  performed on 02/25/2013 and those revealed right lateral breast cancer and small right axillary lymph nodes which are hypermetabolic.No evidence of distant metastasis noted   #3 she has tolerated the cycle #1 chemotherapy with TCH/perjeta very well. I have reviewed her labs with the patient.   #4 Diabetes: controlled and she is followed by her PCP  #5 Gastroesophageal reflux: I have given prescription for omeprazole to be taken 20 mg by mouth once daily 30-40 minutes prior to meals  #6 nausea is controlled with the medications  Her next visit is on 03/15/2013 for chemotherapy, CBC differential, CMP and physician visit   All questions were answered. The patient knows to call the clinic with any problems, questions or concerns. We can certainly see the patient much sooner if necessary.  I spent 20 minutes counseling the patient face to face. The total time spent in the  appointment was 30 minutes.    Wilmon Arms, M.D.  Medical/Oncology [place] [phone] (Office)  03/04/2013, 10:53 AM

## 2013-03-14 ENCOUNTER — Other Ambulatory Visit: Payer: Self-pay | Admitting: Family Medicine

## 2013-03-15 ENCOUNTER — Telehealth: Payer: Self-pay | Admitting: *Deleted

## 2013-03-15 ENCOUNTER — Ambulatory Visit (HOSPITAL_BASED_OUTPATIENT_CLINIC_OR_DEPARTMENT_OTHER): Payer: Medicare Other | Admitting: Oncology

## 2013-03-15 ENCOUNTER — Ambulatory Visit (HOSPITAL_BASED_OUTPATIENT_CLINIC_OR_DEPARTMENT_OTHER): Payer: Medicare Other

## 2013-03-15 ENCOUNTER — Other Ambulatory Visit: Payer: Medicare Other

## 2013-03-15 ENCOUNTER — Other Ambulatory Visit (HOSPITAL_BASED_OUTPATIENT_CLINIC_OR_DEPARTMENT_OTHER): Payer: Medicare Other

## 2013-03-15 ENCOUNTER — Encounter: Payer: Self-pay | Admitting: Oncology

## 2013-03-15 DIAGNOSIS — E119 Type 2 diabetes mellitus without complications: Secondary | ICD-10-CM

## 2013-03-15 DIAGNOSIS — R110 Nausea: Secondary | ICD-10-CM

## 2013-03-15 DIAGNOSIS — K219 Gastro-esophageal reflux disease without esophagitis: Secondary | ICD-10-CM

## 2013-03-15 DIAGNOSIS — Z5111 Encounter for antineoplastic chemotherapy: Secondary | ICD-10-CM

## 2013-03-15 DIAGNOSIS — Z5112 Encounter for antineoplastic immunotherapy: Secondary | ICD-10-CM

## 2013-03-15 DIAGNOSIS — C773 Secondary and unspecified malignant neoplasm of axilla and upper limb lymph nodes: Secondary | ICD-10-CM

## 2013-03-15 DIAGNOSIS — Z170 Estrogen receptor positive status [ER+]: Secondary | ICD-10-CM

## 2013-03-15 DIAGNOSIS — C50411 Malignant neoplasm of upper-outer quadrant of right female breast: Secondary | ICD-10-CM

## 2013-03-15 DIAGNOSIS — D509 Iron deficiency anemia, unspecified: Secondary | ICD-10-CM

## 2013-03-15 DIAGNOSIS — C50419 Malignant neoplasm of upper-outer quadrant of unspecified female breast: Secondary | ICD-10-CM

## 2013-03-15 LAB — CBC WITH DIFFERENTIAL/PLATELET
BASO%: 0.3 % (ref 0.0–2.0)
Basophils Absolute: 0 10*3/uL (ref 0.0–0.1)
EOS ABS: 0 10*3/uL (ref 0.0–0.5)
EOS%: 0 % (ref 0.0–7.0)
HCT: 32.4 % — ABNORMAL LOW (ref 34.8–46.6)
HGB: 10.6 g/dL — ABNORMAL LOW (ref 11.6–15.9)
LYMPH%: 12.4 % — ABNORMAL LOW (ref 14.0–49.7)
MCH: 27.2 pg (ref 25.1–34.0)
MCHC: 32.8 g/dL (ref 31.5–36.0)
MCV: 82.9 fL (ref 79.5–101.0)
MONO#: 0.3 10*3/uL (ref 0.1–0.9)
MONO%: 3.7 % (ref 0.0–14.0)
NEUT%: 83.6 % — ABNORMAL HIGH (ref 38.4–76.8)
NEUTROS ABS: 7.5 10*3/uL — AB (ref 1.5–6.5)
PLATELETS: 202 10*3/uL (ref 145–400)
RBC: 3.91 10*6/uL (ref 3.70–5.45)
RDW: 16.5 % — AB (ref 11.2–14.5)
WBC: 8.9 10*3/uL (ref 3.9–10.3)
lymph#: 1.1 10*3/uL (ref 0.9–3.3)

## 2013-03-15 LAB — COMPREHENSIVE METABOLIC PANEL (CC13)
ALBUMIN: 3.7 g/dL (ref 3.5–5.0)
ALK PHOS: 99 U/L (ref 40–150)
ALT: 18 U/L (ref 0–55)
AST: 19 U/L (ref 5–34)
Anion Gap: 14 mEq/L — ABNORMAL HIGH (ref 3–11)
BILIRUBIN TOTAL: 0.32 mg/dL (ref 0.20–1.20)
BUN: 14.5 mg/dL (ref 7.0–26.0)
CO2: 24 mEq/L (ref 22–29)
Calcium: 8.6 mg/dL (ref 8.4–10.4)
Chloride: 104 mEq/L (ref 98–109)
Creatinine: 1 mg/dL (ref 0.6–1.1)
Glucose: 322 mg/dl — ABNORMAL HIGH (ref 70–140)
POTASSIUM: 3.7 meq/L (ref 3.5–5.1)
SODIUM: 143 meq/L (ref 136–145)
TOTAL PROTEIN: 7.1 g/dL (ref 6.4–8.3)

## 2013-03-15 MED ORDER — DIPHENHYDRAMINE HCL 25 MG PO CAPS
ORAL_CAPSULE | ORAL | Status: AC
  Filled 2013-03-15: qty 2

## 2013-03-15 MED ORDER — DIPHENHYDRAMINE HCL 25 MG PO CAPS
50.0000 mg | ORAL_CAPSULE | Freq: Once | ORAL | Status: AC
  Administered 2013-03-15: 50 mg via ORAL

## 2013-03-15 MED ORDER — DOCETAXEL CHEMO INJECTION 160 MG/16ML
75.0000 mg/m2 | Freq: Once | INTRAVENOUS | Status: AC
  Administered 2013-03-15: 180 mg via INTRAVENOUS
  Filled 2013-03-15: qty 18

## 2013-03-15 MED ORDER — ONDANSETRON 16 MG/50ML IVPB (CHCC)
INTRAVENOUS | Status: AC
  Filled 2013-03-15: qty 16

## 2013-03-15 MED ORDER — ACETAMINOPHEN 325 MG PO TABS
ORAL_TABLET | ORAL | Status: AC
  Filled 2013-03-15: qty 2

## 2013-03-15 MED ORDER — SODIUM CHLORIDE 0.9 % IJ SOLN
10.0000 mL | INTRAMUSCULAR | Status: DC | PRN
  Administered 2013-03-15: 10 mL
  Filled 2013-03-15: qty 10

## 2013-03-15 MED ORDER — ONDANSETRON 16 MG/50ML IVPB (CHCC)
16.0000 mg | Freq: Once | INTRAVENOUS | Status: AC
  Administered 2013-03-15: 16 mg via INTRAVENOUS

## 2013-03-15 MED ORDER — DEXAMETHASONE SODIUM PHOSPHATE 20 MG/5ML IJ SOLN
20.0000 mg | Freq: Once | INTRAMUSCULAR | Status: AC
  Administered 2013-03-15: 20 mg via INTRAVENOUS

## 2013-03-15 MED ORDER — ACETAMINOPHEN 325 MG PO TABS
650.0000 mg | ORAL_TABLET | Freq: Once | ORAL | Status: AC
  Administered 2013-03-15: 650 mg via ORAL

## 2013-03-15 MED ORDER — HEPARIN SOD (PORK) LOCK FLUSH 100 UNIT/ML IV SOLN
500.0000 [IU] | Freq: Once | INTRAVENOUS | Status: AC | PRN
  Administered 2013-03-15: 500 [IU]
  Filled 2013-03-15: qty 5

## 2013-03-15 MED ORDER — DEXAMETHASONE SODIUM PHOSPHATE 20 MG/5ML IJ SOLN
INTRAMUSCULAR | Status: AC
  Filled 2013-03-15: qty 5

## 2013-03-15 MED ORDER — SODIUM CHLORIDE 0.9 % IV SOLN
6.0000 mg/kg | Freq: Once | INTRAVENOUS | Status: AC
  Administered 2013-03-15: 756 mg via INTRAVENOUS
  Filled 2013-03-15: qty 36

## 2013-03-15 MED ORDER — SODIUM CHLORIDE 0.9 % IV SOLN
Freq: Once | INTRAVENOUS | Status: AC
  Administered 2013-03-15: 10:00:00 via INTRAVENOUS

## 2013-03-15 MED ORDER — SODIUM CHLORIDE 0.9 % IV SOLN
900.0000 mg | Freq: Once | INTRAVENOUS | Status: AC
  Administered 2013-03-15: 900 mg via INTRAVENOUS
  Filled 2013-03-15: qty 90

## 2013-03-15 MED ORDER — SODIUM CHLORIDE 0.9 % IV SOLN
420.0000 mg | Freq: Once | INTRAVENOUS | Status: AC
  Administered 2013-03-15: 420 mg via INTRAVENOUS
  Filled 2013-03-15: qty 14

## 2013-03-15 NOTE — Patient Instructions (Signed)
[facility] Discharge Instructions for Patients Receiving Chemotherapy  Today you received the following chemotherapy agents Herceptin/Perjeta/Taxotere/Carboplatin To help prevent nausea and vomiting after your treatment, we encourage you to take your nausea medication as prescribed.  If you develop nausea and vomiting that is not controlled by your nausea medication, call the clinic.   BELOW ARE SYMPTOMS THAT SHOULD BE REPORTED IMMEDIATELY:  *FEVER GREATER THAN 100.5 F  *CHILLS WITH OR WITHOUT FEVER  NAUSEA AND VOMITING THAT IS NOT CONTROLLED WITH YOUR NAUSEA MEDICATION  *UNUSUAL SHORTNESS OF BREATH  *UNUSUAL BRUISING OR BLEEDING  TENDERNESS IN MOUTH AND THROAT WITH OR WITHOUT PRESENCE OF ULCERS  *URINARY PROBLEMS  *BOWEL PROBLEMS  UNUSUAL RASH Items with * indicate a potential emergency and should be followed up as soon as possible.  Feel free to call the clinic you have any questions or concerns. The clinic phone number is [phone].    

## 2013-03-15 NOTE — Telephone Encounter (Signed)
Per staff message and POF I have scheduled appts.  JMW  

## 2013-03-15 NOTE — Patient Instructions (Signed)
Proceed with cycle 2 of TCH/P  We will see you back in 1 week for follow up

## 2013-03-15 NOTE — Telephone Encounter (Signed)
appts made and printed. Pt is aware that tx will be added. i emailed MW to add the tx...td 

## 2013-03-15 NOTE — Progress Notes (Signed)
OFFICE PROGRESS NOTE  CC  LUKING,W S, MD [address] [place] [address] Dr. Stacy Wentworth  Dr. Thomas Cornett  DIAGNOSIS: 58-year-old female with new diagnosis of T1 N1 (stage II) right breast cancer.  STAGE:  Breast cancer of upper-outer quadrant of right female breast  Primary site: Breast (Right)  Staging method: AJCC 7th Edition  Clinical: Stage IIA (T1c, N1, cM0)  Summary: Stage IIA (T1c, N1, cM0)  PRIOR THERAPY: #1 Patient palpated a right breast mass. She had a mammogram that revealed a 1.8 cm lesion in the right breast. There was also noted to be enlarged lymph node by ultrasound. Patient could not tolerate MRI. 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 The biopsied right lymph node was positive for metastatic disease.   #2. Begun on neoadjuvant chemotherapy consisting of Taxotere carboplatinum Herceptin/Perjeta beginning 02/22/2013. Total of 6 cycles of therapy is planned.   CURRENT THERAPY: Neoadjuvant cycle 3 day 1 of TCH/Perjeta  INTERVAL HISTORY: Nicolasa S Silveri 58 y.o. female returns for follow up visit. She is now here for cycle 2 of her TCH/P. overall she's doing well. Her physical exam of the right breast reveals very minimally palpable mass. This is very encouraging. Her heartburn is well controlled on omeprazole. She also complains of mild fatigue. She denies any fever, chills, chest pain, blood in the stool blood in the urine, blurred vision, headaches, shortness of breath, dizziness. She is eating well and drinking enough fluids. Remainder of the 10 point review of systems is negative.    MEDICAL HISTORY: Past Medical History  Diagnosis Date  . Hyperlipidemia   . Obesity   . Asthma     prn neb. and inhaler  . Osteoarthritis 2003    left knee  . Non-insulin dependent type 2 diabetes mellitus   . Graves' disease   . Ophthalmic manifestation of Graves disease   .  Hypertension     on multiple meds., has been on med. > 14 yr.  . Breast cancer 01/2013    right    ALLERGIES:  is allergic to procardia.  MEDICATIONS:  Current Outpatient Prescriptions  Medication Sig Dispense Refill  . ACCU-CHEK SMARTVIEW test strip USE ONE STRIP TO CHECK GLUCOSE 4 TIMES DAILY  100 each  5  . albuterol (PROVENTIL HFA;VENTOLIN HFA) 108 (90 BASE) MCG/ACT inhaler Inhale 2 puffs into the lungs every 4 (four) hours as needed.  18 g  2  . albuterol (PROVENTIL) (5 MG/ML) 0.5% nebulizer solution Take 2.5 mg by nebulization every 6 (six) hours as needed for wheezing or shortness of breath.      . amLODipine (NORVASC) 10 MG tablet TAKE ONE TABLET BY MOUTH ONCE DAILY.  90 tablet  0  . Artificial Tear Ointment (ARTIFICIAL TEARS) ointment Place 1 drop into both eyes as needed.       . aspirin 81 MG tablet Take 81 mg by mouth daily.      . benazepril (LOTENSIN) 40 MG tablet TAKE ONE TABLET BY MOUTH ONCE DAILY.  30 tablet  5  . Biotin 1000 MCG tablet Take 1,000 mcg by mouth daily.      . cloNIDine (CATAPRES - DOSED IN MG/24 HR) 0.2 mg/24hr patch Place 1 patch (0.2 mg total) onto the skin once a week.  4 patch  2  . cycloSPORINE (RESTASIS) 0.05 % ophthalmic emulsion Place 1 drop into both eyes 2 (two) times daily.       .   dexamethasone (DECADRON) 4 MG tablet       . hydrochlorothiazide (HYDRODIURIL) 25 MG tablet TAKE ONE TABLET BY MOUTH ONCE DAILY.  30 tablet  0  . labetalol (NORMODYNE) 300 MG tablet TAKE (1) TABLET BY MOUTH TWICE DAILY.  60 tablet  3  . LANTUS SOLOSTAR 100 UNIT/ML Solostar Pen Inject 5 Units into the skin. Take 5 units in am.      . levothyroxine (SYNTHROID, LEVOTHROID) 100 MCG tablet Take 100 mcg by mouth every morning.      . lidocaine-prilocaine (EMLA) cream Apply 1 application topically as needed.  30 g  0  . metFORMIN (GLUCOPHAGE) 500 MG tablet Take 1 tablet (500 mg total) by mouth 2 (two) times daily with a meal.  60 tablet  6  . omeprazole (PRILOSEC) 20 MG  capsule Take 1 capsule (20 mg total) by mouth daily.  30 capsule  1  . oxyCODONE-acetaminophen (ROXICET) 5-325 MG per tablet Take 1-2 tablets by mouth every 4 (four) hours as needed.  30 tablet  0  . pravastatin (PRAVACHOL) 80 MG tablet Take 1 tablet (80 mg total) by mouth at bedtime.  30 tablet  6  . prochlorperazine (COMPAZINE) 10 MG tablet        No current facility-administered medications for this visit.    SURGICAL HISTORY:  Past Surgical History  Procedure Laterality Date  . Total knee arthroplasty Left 2003  . Total thyroidectomy    . Colonoscopy  2007  . Tubal ligation  1985  . Colonoscopy w/ polypectomy  2009  . Portacath placement Right 02/17/2013    Procedure: INSERTION PORT-A-CATH;  Surgeon: Joyice Faster. Cornett, MD;  Location: [facility];  Service: General;  Laterality: Right;    REVIEW OF SYSTEMS:  Pertinent items are noted in HPI.     PHYSICAL EXAMINATION: There were no vitals taken for this visit. There is no weight on file to calculate BMI. ECOG PERFORMANCE STATUS: 1 - Symptomatic but completely ambulatory HEENT PERRLA, sclera anicteric, conjunctiva no pallor, neck supple no JVD, no thyromegaly   General appearance: alert, cooperative and appears older than stated age Resp: clear to auscultation bilaterally Cardio: regular rate and rhythm GI: soft, non-tender; bowel sounds normal; no masses,  no organomegaly Extremities: extremities normal, atraumatic, no cyanosis or edema Neurologic: Grossly normal Breasts: left breast normal without mass, skin or nipple changes or axillary nodes, abnormal mass palpable in the right breast. No tenderness or skin changes.   LABORATORY DATA: Lab Results  Component Value Date   WBC 8.9 03/15/2013   HGB 10.6* 03/15/2013   HCT 32.4* 03/15/2013   MCV 82.9 03/15/2013   PLT 202 03/15/2013      Chemistry      Component Value Date/Time   NA 137 03/04/2013 1031   NA 141 02/15/2013 1445   K 4.4 03/04/2013 1031   K 4.4 02/15/2013  1445   CL 98 02/15/2013 1445   CO2 25 03/04/2013 1031   CO2 26 02/15/2013 1445   BUN 16.4 03/04/2013 1031   BUN 11 02/15/2013 1445   CREATININE 1.4* 03/04/2013 1031   CREATININE 0.76 02/15/2013 1445   CREATININE 0.96 10/06/2012 0830      Component Value Date/Time   CALCIUM 9.0 03/04/2013 1031   CALCIUM 9.4 02/15/2013 1445   ALKPHOS 140 03/04/2013 1031   ALKPHOS 90 02/15/2013 1445   AST 18 03/04/2013 1031   AST 18 02/15/2013 1445   ALT 15 03/04/2013 1031   ALT 11 02/15/2013  1445   BILITOT 0.33 03/04/2013 1031   BILITOT 0.3 02/15/2013 1445       RADIOGRAPHIC STUDIES:  Chest 2 View  02/15/2013   CLINICAL DATA:  Preop port placement.  Breast cancer.  EXAM: CHEST  2 VIEW  COMPARISON:  None.  FINDINGS: The heart size and mediastinal contours are within normal limits. Both lungs are clear. The visualized skeletal structures are unremarkable.  Surgical clips in the thyroid bilaterally. Thoracic osteophytes diffusely.  IMPRESSION: No active cardiopulmonary disease.   Electronically Signed   By: Charles  Clark M.D.   On: 02/15/2013 14:10   Ct Chest W Contrast  02/25/2013   CLINICAL DATA:  New diagnosis of right breast cancer. Chemotherapy in progress.  EXAM: CT CHEST, ABDOMEN, AND PELVIS WITH CONTRAST  TECHNIQUE: Multidetector CT imaging of the chest, abdomen and pelvis was performed following the standard protocol during bolus administration of intravenous contrast.  CONTRAST:  100mL OMNIPAQUE IOHEXOL 300 MG/ML  SOLN  COMPARISON:  PET-CT performed today.  FINDINGS: CT CHEST FINDINGS  There is a biopsy clip within a 1.6 cm mass laterally in the right breast on image 19. Small adjacent inferior right axillary lymph nodes are not pathologically enlarged, although are mildly hypermetabolic on today's PET-CT. These include a 10 mm node on image 17 and an 8 mm node on image 19.  There are no enlarged internal mammary, mediastinal or hilar lymph nodes. Postsurgical changes are noted at the thoracic inlet consistent with prior  thyroid resection. There is a right IJ central venous catheter with its tip at the SVC right atrial junction.  There is mild atherosclerosis of the aorta and coronary arteries. The heart size is normal. There is no pleural or pericardial effusion.  The lungs are clear.  There are no worrisome osseous findings.  CT ABDOMEN AND PELVIS FINDINGS  The liver appears normal without evidence of metastatic disease. There are small calcified gallstones within the gallbladder lumen. The spleen, pancreas and adrenal glands appear normal. There is a 1.5 cm parapelvic cyst inferiorly in the left kidney. The right kidney appears normal. There is no hydronephrosis.  The stomach, small bowel, appendix and colon demonstrate no significant findings. There are no enlarged abdominal pelvic lymph nodes. There is mild atherosclerosis of the aorta and iliac arteries.  The uterus is lobulated with small calcifications consistent with degenerated fibroids. There is no adnexal mass. The bladder appears normal. There is a large phlebolith or calcified lymph node inferiorly in the left pelvis.  There are no worrisome osseous findings. There are degenerative changes throughout the spine. In addition, there are degenerative changes at the hips and sacroiliac joints bilaterally.  IMPRESSION: 1. Biopsy proven right breast cancer with probable small adjacent nodal metastases. 2. No distant/hematogenous metastatic disease identified. 3. Cholelithiasis, left renal parapelvic cyst and uterine fibroids noted. 4. Degenerative changes in the spine, hips and sacroiliac joints.   Electronically Signed   By: Bill  Veazey M.D.   On: 02/25/2013 13:05   Ct Abdomen Pelvis W Contrast  02/25/2013   CLINICAL DATA:  New diagnosis of right breast cancer. Chemotherapy in progress.  EXAM: CT CHEST, ABDOMEN, AND PELVIS WITH CONTRAST  TECHNIQUE: Multidetector CT imaging of the chest, abdomen and pelvis was performed following the standard protocol during bolus  administration of intravenous contrast.  CONTRAST:  100mL OMNIPAQUE IOHEXOL 300 MG/ML  SOLN  COMPARISON:  PET-CT performed today.  FINDINGS: CT CHEST FINDINGS  There is a biopsy clip within a 1.6 cm mass laterally   in the right breast on image 19. Small adjacent inferior right axillary lymph nodes are not pathologically enlarged, although are mildly hypermetabolic on today's PET-CT. These include a 10 mm node on image 17 and an 8 mm node on image 19.  There are no enlarged internal mammary, mediastinal or hilar lymph nodes. Postsurgical changes are noted at the thoracic inlet consistent with prior thyroid resection. There is a right IJ central venous catheter with its tip at the SVC right atrial junction.  There is mild atherosclerosis of the aorta and coronary arteries. The heart size is normal. There is no pleural or pericardial effusion.  The lungs are clear.  There are no worrisome osseous findings.  CT ABDOMEN AND PELVIS FINDINGS  The liver appears normal without evidence of metastatic disease. There are small calcified gallstones within the gallbladder lumen. The spleen, pancreas and adrenal glands appear normal. There is a 1.5 cm parapelvic cyst inferiorly in the left kidney. The right kidney appears normal. There is no hydronephrosis.  The stomach, small bowel, appendix and colon demonstrate no significant findings. There are no enlarged abdominal pelvic lymph nodes. There is mild atherosclerosis of the aorta and iliac arteries.  The uterus is lobulated with small calcifications consistent with degenerated fibroids. There is no adnexal mass. The bladder appears normal. There is a large phlebolith or calcified lymph node inferiorly in the left pelvis.  There are no worrisome osseous findings. There are degenerative changes throughout the spine. In addition, there are degenerative changes at the hips and sacroiliac joints bilaterally.  IMPRESSION: 1. Biopsy proven right breast cancer with probable small  adjacent nodal metastases. 2. No distant/hematogenous metastatic disease identified. 3. Cholelithiasis, left renal parapelvic cyst and uterine fibroids noted. 4. Degenerative changes in the spine, hips and sacroiliac joints.   Electronically Signed   By: Bill  Veazey M.D.   On: 02/25/2013 13:05   Nm Pet Image Initial (pi) Skull Base To Thigh  02/25/2013   CLINICAL DATA:  Initial treatment strategy for HER-2 positive, node positive right breast cancer.  EXAM: NUCLEAR MEDICINE PET SKULL BASE TO THIGH  FASTING BLOOD GLUCOSE:  Value: 189 mg/dl  TECHNIQUE: 12.2 mCi F-18 FDG was injected intravenously. Full-ring PET imaging was performed from the skull base to thigh after the radiotracer. CT data was obtained and used for attenuation correction and anatomic localization.  COMPARISON:  US CORE BIOPSY dated 01/26/2013; CT CHEST W/CM dated 02/25/2013; CT ABD/PELVIS W CM dated 02/25/2013  FINDINGS: NECK  No hypermetabolic cervical lymph nodes are identified.There are no lesions of the pharyngeal mucosal space.  CHEST  The approximately 1.7 cm mass laterally in the right breast on image 67 is hypermetabolic with SUV max 10.32. Small nodes inferiorly in the right axilla are also mildly hypermetabolic with an SUV max of 2.67. There are no hypermetabolic internal mammary, mediastinal or hilar lymph nodes. There is no hypermetabolic pulmonary activity. CT findings are deferred to the diagnostic study of the same date.  ABDOMEN/PELVIS  There is no hypermetabolic activity within the liver, adrenal glands, spleen or pancreas. There is no hypermetabolic nodal activity. CT findings are deferred to the diagnostic study of the same date.  SKELETON  There is no hypermetabolic activity to suggest osseous metastatic disease.  IMPRESSION: 1. The right lateral breast cancer is hypermetabolic. There are small right axillary lymph nodes which are also hypermetabolic, suspicious for nodal metastases. 2. No evidence of distant/hematogenous  metastatic disease.   Electronically Signed   By: Bill  Veazey M.D.     On: 02/25/2013 13:06   Dg Chest Port 1 View  02/17/2013   CLINICAL DATA:  Port-A-Cath placement.  EXAM: PORTABLE CHEST - 1 VIEW  COMPARISON:  02/15/2013  FINDINGS: Right anterior chest wall, internal jugular, Port-A-Cath has its tip in the mid superior vena cava.  No pneumothorax.  Cardiac silhouette is normal in size. Normal mediastinal and hilar contours. Mild linear atelectasis in the left mid lung. Lungs are otherwise clear. Stable changes from previous thyroid surgery.  IMPRESSION: Right anterior chest wall Port-A-Cath tip is in the mid superior vena cava. No pneumothorax.   Electronically Signed   By: David  Ormond M.D.   On: 02/17/2013 09:21   Dg Fluoro Guide Cv Line-no Report  02/17/2013   CLINICAL DATA: port placement   FLOURO GUIDE CV LINE  Fluoroscopy was utilized by the requesting physician.  No radiographic  interpretation.     ASSESSMENT/PLAN:  57 year old female with  #1. Stage IIA (T1N1) right breast cancer for neoadjuvant chemotherapy  cycle 1 day of therapy with TCH/perjeta started on 02/22/2013. Patient has had chemotherapy teaching, understands risks and benefits and rational for chemotherapy.  #2 PET scan reports and also CAT scan of the abdomen and pelvis and chest  performed on 02/25/2013 and those revealed right lateral breast cancer and small right axillary lymph nodes which are hypermetabolic.No evidence of distant metastasis noted   #3 proceed with  cycle # 2 chemotherapy with TCH/perjeta very well. I have reviewed her labs with the patient.   #4 Diabetes: controlled and she is followed by her PCP  #5 Gastroesophageal reflux: On omeprazole  20 mg by mouth once daily 30-40 minutes prior to meals  #6 nausea is controlled with the medications  #7. Follow up: 1 week with blood work office visit and toxicity check   All questions were answered. The patient knows to call the clinic with any problems,  questions or concerns. We can certainly see the patient much sooner if necessary.  I spent 20 minutes counseling the patient face to face. The total time spent in the appointment was 30 minutes.   Kalsoom Khan, MD Medical/Oncology [facility] [phone] (beeper) [phone] (Office)  03/15/2013, 9:26 AM       

## 2013-03-16 ENCOUNTER — Ambulatory Visit (HOSPITAL_BASED_OUTPATIENT_CLINIC_OR_DEPARTMENT_OTHER): Payer: Medicare Other

## 2013-03-16 VITALS — BP 163/66 | HR 81 | Temp 99.0°F

## 2013-03-16 DIAGNOSIS — Z5189 Encounter for other specified aftercare: Secondary | ICD-10-CM

## 2013-03-16 DIAGNOSIS — C50411 Malignant neoplasm of upper-outer quadrant of right female breast: Secondary | ICD-10-CM

## 2013-03-16 DIAGNOSIS — C50419 Malignant neoplasm of upper-outer quadrant of unspecified female breast: Secondary | ICD-10-CM

## 2013-03-16 MED ORDER — PEGFILGRASTIM INJECTION 6 MG/0.6ML
6.0000 mg | Freq: Once | SUBCUTANEOUS | Status: AC
  Administered 2013-03-16: 6 mg via SUBCUTANEOUS
  Filled 2013-03-16: qty 0.6

## 2013-03-18 ENCOUNTER — Encounter (HOSPITAL_COMMUNITY): Payer: Self-pay | Admitting: Emergency Medicine

## 2013-03-18 DIAGNOSIS — I10 Essential (primary) hypertension: Secondary | ICD-10-CM | POA: Insufficient documentation

## 2013-03-18 DIAGNOSIS — E785 Hyperlipidemia, unspecified: Secondary | ICD-10-CM | POA: Insufficient documentation

## 2013-03-18 DIAGNOSIS — Z79899 Other long term (current) drug therapy: Secondary | ICD-10-CM | POA: Insufficient documentation

## 2013-03-18 DIAGNOSIS — J45909 Unspecified asthma, uncomplicated: Secondary | ICD-10-CM | POA: Insufficient documentation

## 2013-03-18 DIAGNOSIS — E119 Type 2 diabetes mellitus without complications: Secondary | ICD-10-CM | POA: Insufficient documentation

## 2013-03-18 DIAGNOSIS — Z7982 Long term (current) use of aspirin: Secondary | ICD-10-CM | POA: Insufficient documentation

## 2013-03-18 DIAGNOSIS — M1990 Unspecified osteoarthritis, unspecified site: Secondary | ICD-10-CM | POA: Insufficient documentation

## 2013-03-18 DIAGNOSIS — Z794 Long term (current) use of insulin: Secondary | ICD-10-CM | POA: Insufficient documentation

## 2013-03-18 DIAGNOSIS — E669 Obesity, unspecified: Secondary | ICD-10-CM | POA: Insufficient documentation

## 2013-03-18 DIAGNOSIS — IMO0002 Reserved for concepts with insufficient information to code with codable children: Secondary | ICD-10-CM | POA: Insufficient documentation

## 2013-03-18 DIAGNOSIS — Z87891 Personal history of nicotine dependence: Secondary | ICD-10-CM | POA: Insufficient documentation

## 2013-03-18 DIAGNOSIS — C50919 Malignant neoplasm of unspecified site of unspecified female breast: Secondary | ICD-10-CM | POA: Insufficient documentation

## 2013-03-18 DIAGNOSIS — E0500 Thyrotoxicosis with diffuse goiter without thyrotoxic crisis or storm: Secondary | ICD-10-CM | POA: Insufficient documentation

## 2013-03-18 DIAGNOSIS — Z792 Long term (current) use of antibiotics: Secondary | ICD-10-CM | POA: Insufficient documentation

## 2013-03-18 LAB — CBG MONITORING, ED: Glucose-Capillary: 460 mg/dL — ABNORMAL HIGH (ref 70–99)

## 2013-03-18 NOTE — ED Notes (Signed)
CBG over 600 at home.  Pt is on chemo for  Breast cancer.   Alert, No N/V,

## 2013-03-19 ENCOUNTER — Emergency Department (HOSPITAL_COMMUNITY)
Admission: EM | Admit: 2013-03-19 | Discharge: 2013-03-19 | Disposition: A | Discharge status: Home / Self Care | Payer: Medicare Other | Attending: Emergency Medicine | Admitting: Emergency Medicine

## 2013-03-19 DIAGNOSIS — R739 Hyperglycemia, unspecified: Secondary | ICD-10-CM

## 2013-03-19 LAB — CBC WITH DIFFERENTIAL/PLATELET
Basophils Absolute: 0.1 10*3/uL (ref 0.0–0.1)
Basophils Relative: 0 % (ref 0–1)
Eosinophils Absolute: 0 10*3/uL (ref 0.0–0.7)
Eosinophils Relative: 0 % (ref 0–5)
HCT: 31.2 % — ABNORMAL LOW (ref 36.0–46.0)
Hemoglobin: 10.9 g/dL — ABNORMAL LOW (ref 12.0–15.0)
LYMPHS PCT: 2 % — AB (ref 12–46)
Lymphs Abs: 0.3 10*3/uL — ABNORMAL LOW (ref 0.7–4.0)
MCH: 28.1 pg (ref 26.0–34.0)
MCHC: 34.9 g/dL (ref 30.0–36.0)
MCV: 80.4 fL (ref 78.0–100.0)
Monocytes Absolute: 0 10*3/uL — ABNORMAL LOW (ref 0.1–1.0)
Monocytes Relative: 0 % — ABNORMAL LOW (ref 3–12)
NEUTROS PCT: 98 % — AB (ref 43–77)
Neutro Abs: 14 10*3/uL — ABNORMAL HIGH (ref 1.7–7.7)
PLATELETS: 128 10*3/uL — AB (ref 150–400)
RBC: 3.88 MIL/uL (ref 3.87–5.11)
RDW: 16.7 % — ABNORMAL HIGH (ref 11.5–15.5)
WBC: 14.4 10*3/uL — ABNORMAL HIGH (ref 4.0–10.5)

## 2013-03-19 LAB — URINE MICROSCOPIC-ADD ON

## 2013-03-19 LAB — COMPREHENSIVE METABOLIC PANEL
ALK PHOS: 112 U/L (ref 39–117)
ALT: 24 U/L (ref 0–35)
AST: 21 U/L (ref 0–37)
Albumin: 4 g/dL (ref 3.5–5.2)
BILIRUBIN TOTAL: 0.8 mg/dL (ref 0.3–1.2)
BUN: 20 mg/dL (ref 6–23)
CHLORIDE: 92 meq/L — AB (ref 96–112)
CO2: 30 mEq/L (ref 19–32)
Calcium: 8.9 mg/dL (ref 8.4–10.5)
Creatinine, Ser: 0.92 mg/dL (ref 0.50–1.10)
GFR calc non Af Amer: 67 mL/min — ABNORMAL LOW (ref >90)
GFR, EST AFRICAN AMERICAN: 78 mL/min — AB (ref >90)
GLUCOSE: 463 mg/dL — AB (ref 70–99)
POTASSIUM: 3.8 meq/L (ref 3.7–5.3)
SODIUM: 137 meq/L (ref 137–147)
Total Protein: 7.2 g/dL (ref 6.0–8.3)

## 2013-03-19 LAB — URINALYSIS, ROUTINE W REFLEX MICROSCOPIC
Bilirubin Urine: NEGATIVE
Glucose, UA: >1000 mg/dL — AB
Ketones, ur: NEGATIVE mg/dL
Leukocytes, UA: NEGATIVE
NITRITE: NEGATIVE
PH: 5.5 (ref 5.0–8.0)
Protein, ur: NEGATIVE mg/dL
SPECIFIC GRAVITY, URINE: 1.01 (ref 1.005–1.030)
UROBILINOGEN UA: 0.2 mg/dL (ref 0.0–1.0)

## 2013-03-19 LAB — LIPASE, BLOOD: Lipase: 42 U/L (ref 11–59)

## 2013-03-19 LAB — CBG MONITORING, ED
GLUCOSE-CAPILLARY: 428 mg/dL — AB (ref 70–99)
GLUCOSE-CAPILLARY: 395 mg/dL — AB (ref 70–99)
Glucose-Capillary: 365 mg/dL — ABNORMAL HIGH (ref 70–99)

## 2013-03-19 MED ORDER — ONDANSETRON 4 MG PO TBDP: 4.0000 mg | ORAL_TABLET | Freq: Three times a day (TID) | ORAL | Status: DC | PRN

## 2013-03-19 MED ORDER — INSULIN ASPART 100 UNIT/ML ~~LOC~~ SOLN
10.0000 [IU] | SUBCUTANEOUS | Status: AC
  Administered 2013-03-19: 10 [IU] via SUBCUTANEOUS

## 2013-03-19 MED ORDER — INSULIN ASPART 100 UNIT/ML ~~LOC~~ SOLN: 10.0000 [IU] | Freq: Once | SUBCUTANEOUS | Status: DC

## 2013-03-19 MED ORDER — INSULIN ASPART 100 UNIT/ML ~~LOC~~ SOLN
10.0000 [IU] | Freq: Once | SUBCUTANEOUS | Status: AC
  Administered 2013-03-19: 10 [IU] via SUBCUTANEOUS
  Filled 2013-03-19: qty 1

## 2013-03-19 MED ORDER — SODIUM CHLORIDE 0.9 % IV BOLUS (SEPSIS)
1000.0000 mL | Freq: Once | INTRAVENOUS | Status: AC
  Administered 2013-03-19: 1000 mL via INTRAVENOUS

## 2013-03-19 NOTE — Discharge Instructions (Signed)
Please check your blood sugar every 2 hours for the next 10 hours and eat a healthy diabetic diet.  If your blood sugar rises or continues to stay elevated, you should be rechecked tomorrow or return to the ER if symptoms worsen.  Please call your doctor for a followup appointment within 24-48 hours. When you talk to your doctor please let them know that you were seen in the emergency department and have them acquire all of your records so that they can discuss the findings with you and formulate a treatment plan to fully care for your new and ongoing problems.

## 2013-03-19 NOTE — ED Provider Notes (Signed)
CSN: [id number]     Arrival date & time 03/18/13  2117 History   First MD Initiated Contact with Patient 03/19/13 0211     Chief Complaint  Patient presents with  . Hyperglycemia     (Consider location/radiation/quality/duration/timing/severity/associated sxs/prior Treatment) HPI Comments: 58 year old female, history of diabetes and a history of recently diagnosed breast cancer who currently takes chemotherapy and prednisone. She has been on chemotherapy for the last 3-1/2 weeks, she received her second dose of chemotherapy on Tuesday and has been taking steroid therapy for the last 3 days at the request of her oncologist. She has had nausea and decreased appetite but states that over the last 12 hours she has developed generalized weakness associated with increased blood sugar. He registered as too high to measure at home, she presents without any acute treatments prior to arrival including no extra insulin. In fact she was only on metformin prior to her starting chemotherapy at which time she was switched to Lantus. She takes Lantus in the morning, she has not missed any doses, she has had very little to eat today. She also has associated diarrhea. The patient denies fevers, chills, cough, shortness of breath, chest pain or back pain. She has no rashes, no headache, no focal weakness  Patient is a 58 y.o. female presenting with hyperglycemia. The history is provided by the patient.  Hyperglycemia   Past Medical History  Diagnosis Date  . Hyperlipidemia   . Obesity   . Asthma     prn neb. and inhaler  . Osteoarthritis 2003    left knee  . Non-insulin dependent type 2 diabetes mellitus   . Graves' disease   . Ophthalmic manifestation of Graves disease   . Hypertension     on multiple meds., has been on med. > 14 yr.  . Breast cancer 01/2013    right   Past Surgical History  Procedure Laterality Date  . Total knee arthroplasty Left 2003  . Total thyroidectomy    . Colonoscopy  2007   . Tubal ligation  1985  . Colonoscopy w/ polypectomy  2009  . Portacath placement Right 02/17/2013    Procedure: INSERTION PORT-A-CATH;  Surgeon: Joyice Faster. Cornett, MD;  Location: [facility];  Service: General;  Laterality: Right;   Family History  Problem Relation Age of Onset  . Heart disease Father   . Lung cancer Father   . Diabetes Mother    History  Substance Use Topics  . Smoking status: Former Smoker -- 20 years    Quit date: 02/10/1991  . Smokeless tobacco: Never Used  . Alcohol Use: No   OB History   Grav Para Term Preterm Abortions TAB SAB Ect Mult Living                 Review of Systems  All other systems reviewed and are negative.      Allergies  Procardia  Home Medications   Current Outpatient Rx  Name  Route  Sig  Dispense  Refill  . ACCU-CHEK SMARTVIEW test strip      USE ONE STRIP TO CHECK GLUCOSE 4 TIMES DAILY   100 each   5   . albuterol (PROVENTIL HFA;VENTOLIN HFA) 108 (90 BASE) MCG/ACT inhaler   Inhalation   Inhale 2 puffs into the lungs every 4 (four) hours as needed.   18 g   2   . albuterol (PROVENTIL) (5 MG/ML) 0.5% nebulizer solution   Nebulization   Take 2.5  mg by nebulization every 6 (six) hours as needed for wheezing or shortness of breath.         Marland Kitchen amLODipine (NORVASC) 10 MG tablet      TAKE ONE TABLET BY MOUTH ONCE DAILY.   90 tablet   0     *NEEDS OFFICE VISIT*   . Artificial Tear Ointment (ARTIFICIAL TEARS) ointment   Both Eyes   Place 1 drop into both eyes as needed.          Marland Kitchen aspirin 81 MG tablet   Oral   Take 81 mg by mouth daily.         . benazepril (LOTENSIN) 40 MG tablet      TAKE ONE TABLET BY MOUTH ONCE DAILY.   30 tablet   5   . Biotin 1000 MCG tablet   Oral   Take 1,000 mcg by mouth daily.         . cloNIDine (CATAPRES - DOSED IN MG/24 HR) 0.2 mg/24hr patch   Transdermal   Place 1 patch (0.2 mg total) onto the skin once a week.   4 patch   2   . cycloSPORINE  (RESTASIS) 0.05 % ophthalmic emulsion   Both Eyes   Place 1 drop into both eyes 2 (two) times daily.          Marland Kitchen dexamethasone (DECADRON) 4 MG tablet               . hydrochlorothiazide (HYDRODIURIL) 25 MG tablet      TAKE ONE TABLET BY MOUTH ONCE DAILY.   30 tablet   0     Needs office visit   . labetalol (NORMODYNE) 300 MG tablet      TAKE (1) TABLET BY MOUTH TWICE DAILY.   60 tablet   3   . LANTUS SOLOSTAR 100 UNIT/ML Solostar Pen   Subcutaneous   Inject 5 Units into the skin. Take 5 units in am.         . levothyroxine (SYNTHROID, LEVOTHROID) 100 MCG tablet   Oral   Take 100 mcg by mouth every morning.         . lidocaine-prilocaine (EMLA) cream   Topical   Apply 1 application topically as needed.   30 g   0   . metFORMIN (GLUCOPHAGE) 500 MG tablet   Oral   Take 1 tablet (500 mg total) by mouth 2 (two) times daily with a meal.   60 tablet   6   . omeprazole (PRILOSEC) 20 MG capsule   Oral   Take 1 capsule (20 mg total) by mouth daily.   30 capsule   1   . ondansetron (ZOFRAN ODT) 4 MG disintegrating tablet   Oral   Take 1 tablet (4 mg total) by mouth every 8 (eight) hours as needed for nausea.   10 tablet   0   . oxyCODONE-acetaminophen (ROXICET) 5-325 MG per tablet   Oral   Take 1-2 tablets by mouth every 4 (four) hours as needed.   30 tablet   0   . pravastatin (PRAVACHOL) 80 MG tablet   Oral   Take 1 tablet (80 mg total) by mouth at bedtime.   30 tablet   6   . prochlorperazine (COMPAZINE) 10 MG tablet                BP 150/65  Pulse 70  Temp(Src) 98.2 F (36.8 C) (Oral)  Resp 18  Ht 5' 4.5" (1.638  m)  Wt 262 lb (118.842 kg)  BMI 44.29 kg/m2  SpO2 97% Physical Exam  Nursing note and vitals reviewed. Constitutional: She appears well-developed and well-nourished. No distress.  HENT:  Head: Normocephalic and atraumatic.  Mouth/Throat: Oropharynx is clear and moist. No oropharyngeal exudate.  Eyes: Conjunctivae and EOM  are normal. Pupils are equal, round, and reactive to light. Right eye exhibits no discharge. Left eye exhibits no discharge. No scleral icterus.  Neck: Normal range of motion. Neck supple. No JVD present. No thyromegaly present.  Cardiovascular: Normal rate, regular rhythm, normal heart sounds and intact distal pulses.  Exam reveals no gallop and no friction rub.   No murmur heard. Pulmonary/Chest: Effort normal and breath sounds normal. No respiratory distress. She has no wheezes. She has no rales.  Abdominal: Soft. Bowel sounds are normal. She exhibits no distension and no mass. There is no tenderness.  Musculoskeletal: Normal range of motion. She exhibits no edema and no tenderness.  Lymphadenopathy:    She has no cervical adenopathy.  Neurological: She is alert. Coordination normal.  Skin: Skin is warm and dry. No rash noted. No erythema.  Psychiatric: She has a normal mood and affect. Her behavior is normal.    ED Course  Procedures (including critical care time) Labs Review Labs Reviewed  URINALYSIS, ROUTINE W REFLEX MICROSCOPIC - Abnormal; Notable for the following:    Glucose, UA >1000 (*)    Hgb urine dipstick TRACE (*)    All other components within normal limits  CBC WITH DIFFERENTIAL - Abnormal; Notable for the following:    WBC 14.4 (*)    Hemoglobin 10.9 (*)    HCT 31.2 (*)    RDW 16.7 (*)    Platelets 128 (*)    Neutrophils Relative % 98 (*)    Neutro Abs 14.0 (*)    Lymphocytes Relative 2 (*)    Lymphs Abs 0.3 (*)    Monocytes Relative 0 (*)    Monocytes Absolute 0.0 (*)    All other components within normal limits  COMPREHENSIVE METABOLIC PANEL - Abnormal; Notable for the following:    Chloride 92 (*)    Glucose, Bld 463 (*)    GFR calc non Af Amer 67 (*)    GFR calc Af Amer 78 (*)    All other components within normal limits  CBG MONITORING, ED - Abnormal; Notable for the following:    Glucose-Capillary 460 (*)    All other components within normal limits   CBG MONITORING, ED - Abnormal; Notable for the following:    Glucose-Capillary 428 (*)    All other components within normal limits  CBG MONITORING, ED - Abnormal; Notable for the following:    Glucose-Capillary 395 (*)    All other components within normal limits  CBG MONITORING, ED - Abnormal; Notable for the following:    Glucose-Capillary 365 (*)    All other components within normal limits  LIPASE, BLOOD  URINE MICROSCOPIC-ADD ON   Imaging Review No results found.   EKG Interpretation None      MDM   Final diagnoses:  Hyperglycemia    The patient does not appear to be in any distress, her exam is rather unremarkable, her blood sugar was 460 on arrival, it will need to be rechecked, labs will be drawn to make sure that she is not in diabetic ketoacidosis.  BS improved after insulin and fluids - pt has been ambulatory - I have offered her another dose of insulin  given her persistently elevated (though improved) BS and she has agreed to second dose but does not want to stay for observation.  She has 2 family members with her who are CNA's and can check CBG q 2 hours today - they are all in agreement - her leukocytosis is non specific and may be due to the steroids - otherwise she is afebrile and has no other sx including pulmonary sx.  She has ambulated to the bathroom withotu dififculty.    indications for return given, understanding expressed.   Meds given in ED:  Medications  insulin aspart (novoLOG) injection 10 Units (not administered)  sodium chloride 0.9 % bolus 1,000 mL (0 mLs Intravenous Stopped 03/19/13 0424)  insulin aspart (novoLOG) injection 10 Units (10 Units Subcutaneous Given 03/19/13 0445)    New Prescriptions   ONDANSETRON (ZOFRAN ODT) 4 MG DISINTEGRATING TABLET    Take 1 tablet (4 mg total) by mouth every 8 (eight) hours as needed for nausea.      Johnna Acosta, MD 03/19/13 [id number]

## 2013-03-22 ENCOUNTER — Ambulatory Visit (HOSPITAL_BASED_OUTPATIENT_CLINIC_OR_DEPARTMENT_OTHER): Payer: Medicare Other | Admitting: Oncology

## 2013-03-22 ENCOUNTER — Telehealth: Payer: Self-pay | Admitting: Oncology

## 2013-03-22 ENCOUNTER — Ambulatory Visit (HOSPITAL_BASED_OUTPATIENT_CLINIC_OR_DEPARTMENT_OTHER): Payer: Medicare Other

## 2013-03-22 ENCOUNTER — Other Ambulatory Visit (HOSPITAL_BASED_OUTPATIENT_CLINIC_OR_DEPARTMENT_OTHER): Payer: Medicare Other

## 2013-03-22 ENCOUNTER — Other Ambulatory Visit: Payer: Self-pay | Admitting: *Deleted

## 2013-03-22 VITALS — BP 121/64 | HR 90 | Resp 20

## 2013-03-22 VITALS — BP 140/78 | HR 106 | Temp 98.2°F | Resp 20 | Ht 64.5 in

## 2013-03-22 DIAGNOSIS — E860 Dehydration: Secondary | ICD-10-CM

## 2013-03-22 DIAGNOSIS — C50419 Malignant neoplasm of upper-outer quadrant of unspecified female breast: Secondary | ICD-10-CM

## 2013-03-22 DIAGNOSIS — C50411 Malignant neoplasm of upper-outer quadrant of right female breast: Secondary | ICD-10-CM

## 2013-03-22 DIAGNOSIS — Z170 Estrogen receptor positive status [ER+]: Secondary | ICD-10-CM

## 2013-03-22 DIAGNOSIS — E119 Type 2 diabetes mellitus without complications: Secondary | ICD-10-CM

## 2013-03-22 DIAGNOSIS — T451X5A Adverse effect of antineoplastic and immunosuppressive drugs, initial encounter: Principal | ICD-10-CM

## 2013-03-22 DIAGNOSIS — C773 Secondary and unspecified malignant neoplasm of axilla and upper limb lymph nodes: Secondary | ICD-10-CM

## 2013-03-22 DIAGNOSIS — R112 Nausea with vomiting, unspecified: Secondary | ICD-10-CM

## 2013-03-22 DIAGNOSIS — R34 Anuria and oliguria: Secondary | ICD-10-CM

## 2013-03-22 DIAGNOSIS — R197 Diarrhea, unspecified: Secondary | ICD-10-CM

## 2013-03-22 LAB — COMPREHENSIVE METABOLIC PANEL (CC13)
ALT: 33 U/L (ref 0–55)
AST: 25 U/L (ref 5–34)
Albumin: 3.9 g/dL (ref 3.5–5.0)
Alkaline Phosphatase: 116 U/L (ref 40–150)
Anion Gap: 13 mEq/L — ABNORMAL HIGH (ref 3–11)
BUN: 13.1 mg/dL (ref 7.0–26.0)
CHLORIDE: 98 meq/L (ref 98–109)
CO2: 24 mEq/L (ref 22–29)
Calcium: 9 mg/dL (ref 8.4–10.4)
Creatinine: 1.2 mg/dL — ABNORMAL HIGH (ref 0.6–1.1)
Glucose: 257 mg/dl — ABNORMAL HIGH (ref 70–140)
Potassium: 3.2 mEq/L — ABNORMAL LOW (ref 3.5–5.1)
Sodium: 136 mEq/L (ref 136–145)
Total Bilirubin: 0.52 mg/dL (ref 0.20–1.20)
Total Protein: 6.9 g/dL (ref 6.4–8.3)

## 2013-03-22 LAB — CBC WITH DIFFERENTIAL/PLATELET
BASO%: 0.2 % (ref 0.0–2.0)
Basophils Absolute: 0 10*3/uL (ref 0.0–0.1)
EOS%: 0.3 % (ref 0.0–7.0)
Eosinophils Absolute: 0 10*3/uL (ref 0.0–0.5)
HCT: 35.5 % (ref 34.8–46.6)
HEMOGLOBIN: 11.8 g/dL (ref 11.6–15.9)
LYMPH#: 2.2 10*3/uL (ref 0.9–3.3)
LYMPH%: 21.9 % (ref 14.0–49.7)
MCH: 27.3 pg (ref 25.1–34.0)
MCHC: 33.2 g/dL (ref 31.5–36.0)
MCV: 82.2 fL (ref 79.5–101.0)
MONO#: 1.6 10*3/uL — ABNORMAL HIGH (ref 0.1–0.9)
MONO%: 15.8 % — AB (ref 0.0–14.0)
NEUT#: 6.3 10*3/uL (ref 1.5–6.5)
NEUT%: 61.8 % (ref 38.4–76.8)
Platelets: 162 10*3/uL (ref 145–400)
RBC: 4.32 10*6/uL (ref 3.70–5.45)
RDW: 16.2 % — AB (ref 11.2–14.5)
WBC: 10.2 10*3/uL (ref 3.9–10.3)

## 2013-03-22 MED ORDER — FUROSEMIDE 10 MG/ML IJ SOLN: 20.0000 mg | Freq: Once | INTRAMUSCULAR | Status: DC

## 2013-03-22 MED ORDER — SODIUM CHLORIDE 0.9 % IV SOLN
2000.0000 mL | Freq: Once | INTRAVENOUS | Status: AC
  Administered 2013-03-22: 1000 mL via INTRAVENOUS

## 2013-03-22 MED ORDER — SODIUM CHLORIDE 0.9 % IV SOLN
Freq: Once | INTRAVENOUS | Status: AC
  Administered 2013-03-22: 10:00:00 via INTRAVENOUS

## 2013-03-22 MED ORDER — SODIUM CHLORIDE 0.9 % IJ SOLN
10.0000 mL | Freq: Once | INTRAMUSCULAR | Status: AC
  Administered 2013-03-22: 10 mL
  Filled 2013-03-22: qty 10

## 2013-03-22 MED ORDER — HEPARIN SOD (PORK) LOCK FLUSH 100 UNIT/ML IV SOLN
500.0000 [IU] | Freq: Once | INTRAVENOUS | Status: AC
Start: 2013-03-22 — End: 2013-03-22
  Administered 2013-03-22: 500 [IU]
  Filled 2013-03-22: qty 5

## 2013-03-22 MED ORDER — ONDANSETRON 16 MG/50ML IVPB (CHCC)
16.0000 mg | Freq: Once | INTRAVENOUS | Status: AC
Start: 2013-03-22 — End: 2013-03-22
  Administered 2013-03-22: 16 mg via INTRAVENOUS

## 2013-03-22 MED ORDER — ONDANSETRON 16 MG/50ML IVPB (CHCC)
INTRAVENOUS | Status: AC
  Filled 2013-03-22: qty 16

## 2013-03-22 NOTE — Progress Notes (Signed)
1200 -  Pt voided  175cc of concentrated yellow urine. Dr. Humphrey Rolls notified of pt voided after 1L Normal Saline.  Order to Hold Lasix for now, but continue with IVF until further instructions from md. 1340 -   Pt voided  100cc of yellow urine. 1400  -  Dr. Humphrey Rolls notified of pt's progress - per pt, feels much better, did not feel having urine retention as prior to IVF.  Vital signs stable.  Per Dr, Humphrey Rolls, finish current IVF bag and can discharge pt.  Pt to come back for more IVF on 3/11 and 3/12. Explanations given to pt and daughter.  Both voiced understanding.

## 2013-03-22 NOTE — Patient Instructions (Signed)
Dehydration, Adult Dehydration is when you lose more fluids from the body than you take in. Vital organs like the kidneys, brain, and heart cannot function without a proper amount of fluids and salt. Any loss of fluids from the body can cause dehydration.  CAUSES   Vomiting.  Diarrhea.  Excessive sweating.  Excessive urine output.  Fever. SYMPTOMS  Mild dehydration  Thirst.  Dry lips.  Slightly dry mouth. Moderate dehydration  Very dry mouth.  Sunken eyes.  Skin does not bounce back quickly when lightly pinched and released.  Dark urine and decreased urine production.  Decreased tear production.  Headache. Severe dehydration  Very dry mouth.  Extreme thirst.  Rapid, weak pulse (more than 100 beats per minute at rest).  Cold hands and feet.  Not able to sweat in spite of heat and temperature.  Rapid breathing.  Blue lips.  Confusion and lethargy.  Difficulty being awakened.  Minimal urine production.  No tears. DIAGNOSIS  Your caregiver will diagnose dehydration based on your symptoms and your exam. Blood and urine tests will help confirm the diagnosis. The diagnostic evaluation should also identify the cause of dehydration. TREATMENT  Treatment of mild or moderate dehydration can often be done at home by increasing the amount of fluids that you drink. It is best to drink small amounts of fluid more often. Drinking too much at one time can make vomiting worse. Refer to the home care instructions below. Severe dehydration needs to be treated at the hospital where you will probably be given intravenous (IV) fluids that contain water and electrolytes. HOME CARE INSTRUCTIONS   Ask your caregiver about specific rehydration instructions.  Drink enough fluids to keep your urine clear or pale yellow.  Drink small amounts frequently if you have nausea and vomiting.  Eat as you normally do.  Avoid:  Foods or drinks high in sugar.  Carbonated  drinks.  Juice.  Extremely hot or cold fluids.  Drinks with caffeine.  Fatty, greasy foods.  Alcohol.  Tobacco.  Overeating.  Gelatin desserts.  Wash your hands well to avoid spreading bacteria and viruses.  Only take over-the-counter or prescription medicines for pain, discomfort, or fever as directed by your caregiver.  Ask your caregiver if you should continue all prescribed and over-the-counter medicines.  Keep all follow-up appointments with your caregiver. SEEK MEDICAL CARE IF:  You have abdominal pain and it increases or stays in one area (localizes).  You have a rash, stiff neck, or severe headache.  You are irritable, sleepy, or difficult to awaken.  You are weak, dizzy, or extremely thirsty. SEEK IMMEDIATE MEDICAL CARE IF:   You are unable to keep fluids down or you get worse despite treatment.  You have frequent episodes of vomiting or diarrhea.  You have blood or green matter (bile) in your vomit.  You have blood in your stool or your stool looks black and tarry.  You have not urinated in 6 to 8 hours, or you have only urinated a small amount of very dark urine.  You have a fever.  You faint. MAKE SURE YOU:   Understand these instructions.  Will watch your condition.  Will get help right away if you are not doing well or get worse. Document Released: 12/30/2004 Document Revised: 03/24/2011 Document Reviewed: 08/19/2010 [employer] Patient Information 2014 [employer]  

## 2013-03-22 NOTE — Telephone Encounter (Signed)
added f/u 3/12 - chemo scheduler added IVF's for today 3/11 and 3/12. pt given schedule while in inf. per 2nd 3/10 pof do not add pt to CP2's schedule

## 2013-03-23 ENCOUNTER — Ambulatory Visit (HOSPITAL_BASED_OUTPATIENT_CLINIC_OR_DEPARTMENT_OTHER): Payer: Medicare Other

## 2013-03-23 ENCOUNTER — Other Ambulatory Visit: Payer: Self-pay | Admitting: Oncology

## 2013-03-23 VITALS — BP 115/65 | HR 73 | Temp 97.0°F | Resp 16

## 2013-03-23 DIAGNOSIS — C50419 Malignant neoplasm of upper-outer quadrant of unspecified female breast: Secondary | ICD-10-CM

## 2013-03-23 DIAGNOSIS — E860 Dehydration: Secondary | ICD-10-CM

## 2013-03-23 MED ORDER — SODIUM CHLORIDE 0.9 % IV SOLN
INTRAVENOUS | Status: DC
  Administered 2013-03-23: 11:00:00 via INTRAVENOUS

## 2013-03-23 MED ORDER — HEPARIN SOD (PORK) LOCK FLUSH 100 UNIT/ML IV SOLN
500.0000 [IU] | Freq: Once | INTRAVENOUS | Status: AC
  Administered 2013-03-23: 500 [IU] via INTRAVENOUS
  Filled 2013-03-23: qty 5

## 2013-03-23 MED ORDER — SODIUM CHLORIDE 0.9 % IJ SOLN
10.0000 mL | Freq: Once | INTRAMUSCULAR | Status: AC
  Administered 2013-03-23: 10 mL via INTRAVENOUS
  Filled 2013-03-23: qty 10

## 2013-03-23 NOTE — Patient Instructions (Signed)
Dehydration, Adult Dehydration is when you lose more fluids from the body than you take in. Vital organs like the kidneys, brain, and heart cannot function without a proper amount of fluids and salt. Any loss of fluids from the body can cause dehydration.  CAUSES   Vomiting.  Diarrhea.  Excessive sweating.  Excessive urine output.  Fever. SYMPTOMS  Mild dehydration  Thirst.  Dry lips.  Slightly dry mouth. Moderate dehydration  Very dry mouth.  Sunken eyes.  Skin does not bounce back quickly when lightly pinched and released.  Dark urine and decreased urine production.  Decreased tear production.  Headache. Severe dehydration  Very dry mouth.  Extreme thirst.  Rapid, weak pulse (more than 100 beats per minute at rest).  Cold hands and feet.  Not able to sweat in spite of heat and temperature.  Rapid breathing.  Blue lips.  Confusion and lethargy.  Difficulty being awakened.  Minimal urine production.  No tears. DIAGNOSIS  Your caregiver will diagnose dehydration based on your symptoms and your exam. Blood and urine tests will help confirm the diagnosis. The diagnostic evaluation should also identify the cause of dehydration. TREATMENT  Treatment of mild or moderate dehydration can often be done at home by increasing the amount of fluids that you drink. It is best to drink small amounts of fluid more often. Drinking too much at one time can make vomiting worse. Refer to the home care instructions below. Severe dehydration needs to be treated at the hospital where you will probably be given intravenous (IV) fluids that contain water and electrolytes. HOME CARE INSTRUCTIONS   Ask your caregiver about specific rehydration instructions.  Drink enough fluids to keep your urine clear or pale yellow.  Drink small amounts frequently if you have nausea and vomiting.  Eat as you normally do.  Avoid:  Foods or drinks high in sugar.  Carbonated  drinks.  Juice.  Extremely hot or cold fluids.  Drinks with caffeine.  Fatty, greasy foods.  Alcohol.  Tobacco.  Overeating.  Gelatin desserts.  Wash your hands well to avoid spreading bacteria and viruses.  Only take over-the-counter or prescription medicines for pain, discomfort, or fever as directed by your caregiver.  Ask your caregiver if you should continue all prescribed and over-the-counter medicines.  Keep all follow-up appointments with your caregiver. SEEK MEDICAL CARE IF:  You have abdominal pain and it increases or stays in one area (localizes).  You have a rash, stiff neck, or severe headache.  You are irritable, sleepy, or difficult to awaken.  You are weak, dizzy, or extremely thirsty. SEEK IMMEDIATE MEDICAL CARE IF:   You are unable to keep fluids down or you get worse despite treatment.  You have frequent episodes of vomiting or diarrhea.  You have blood or green matter (bile) in your vomit.  You have blood in your stool or your stool looks black and tarry.  You have not urinated in 6 to 8 hours, or you have only urinated a small amount of very dark urine.  You have a fever.  You faint. MAKE SURE YOU:   Understand these instructions.  Will watch your condition.  Will get help right away if you are not doing well or get worse. Document Released: 12/30/2004 Document Revised: 03/24/2011 Document Reviewed: 08/19/2010 [employer] Patient Information 2014 [employer]  

## 2013-03-24 ENCOUNTER — Ambulatory Visit (HOSPITAL_BASED_OUTPATIENT_CLINIC_OR_DEPARTMENT_OTHER): Payer: Medicare Other | Admitting: Physician Assistant

## 2013-03-24 ENCOUNTER — Telehealth: Payer: Self-pay | Admitting: Oncology

## 2013-03-24 ENCOUNTER — Ambulatory Visit (HOSPITAL_BASED_OUTPATIENT_CLINIC_OR_DEPARTMENT_OTHER): Payer: Medicare Other

## 2013-03-24 ENCOUNTER — Encounter: Payer: Self-pay | Admitting: Physician Assistant

## 2013-03-24 VITALS — BP 138/82 | HR 90 | Temp 97.8°F | Resp 19 | Ht 64.5 in | Wt 258.8 lb

## 2013-03-24 DIAGNOSIS — C50419 Malignant neoplasm of upper-outer quadrant of unspecified female breast: Secondary | ICD-10-CM

## 2013-03-24 DIAGNOSIS — B370 Candidal stomatitis: Secondary | ICD-10-CM

## 2013-03-24 DIAGNOSIS — E860 Dehydration: Secondary | ICD-10-CM

## 2013-03-24 DIAGNOSIS — E119 Type 2 diabetes mellitus without complications: Secondary | ICD-10-CM

## 2013-03-24 DIAGNOSIS — R197 Diarrhea, unspecified: Secondary | ICD-10-CM

## 2013-03-24 DIAGNOSIS — E876 Hypokalemia: Secondary | ICD-10-CM

## 2013-03-24 DIAGNOSIS — R110 Nausea: Secondary | ICD-10-CM

## 2013-03-24 DIAGNOSIS — C50411 Malignant neoplasm of upper-outer quadrant of right female breast: Secondary | ICD-10-CM

## 2013-03-24 DIAGNOSIS — R634 Abnormal weight loss: Secondary | ICD-10-CM

## 2013-03-24 DIAGNOSIS — K219 Gastro-esophageal reflux disease without esophagitis: Secondary | ICD-10-CM

## 2013-03-24 DIAGNOSIS — Z170 Estrogen receptor positive status [ER+]: Secondary | ICD-10-CM

## 2013-03-24 MED ORDER — SODIUM CHLORIDE 0.9 % IJ SOLN
10.0000 mL | INTRAMUSCULAR | Status: DC | PRN
  Administered 2013-03-24: 10 mL via INTRAVENOUS
  Filled 2013-03-24: qty 10

## 2013-03-24 MED ORDER — HEPARIN SOD (PORK) LOCK FLUSH 100 UNIT/ML IV SOLN
500.0000 [IU] | Freq: Once | INTRAVENOUS | Status: AC
  Administered 2013-03-24: 500 [IU] via INTRAVENOUS
  Filled 2013-03-24: qty 5

## 2013-03-24 MED ORDER — FLUCONAZOLE 100 MG PO TABS: ORAL_TABLET | ORAL | Status: DC

## 2013-03-24 MED ORDER — SODIUM CHLORIDE 0.9 % IV SOLN
Freq: Once | INTRAVENOUS | Status: AC
  Administered 2013-03-24: 14:00:00 via INTRAVENOUS
  Filled 2013-03-24: qty 1000

## 2013-03-24 NOTE — Progress Notes (Signed)
OFFICE PROGRESS NOTE  CC  Lacey Battiest, MD [place] [address] Dr. Thea Silversmith  Dr. Erroll Luna  DIAGNOSIS: 58 year old female with new diagnosis of T1 N1 (stage II) right breast cancer.  STAGE:  Breast cancer of upper-outer quadrant of right female breast  Primary site: Breast (Right)  Staging method: AJCC 7th Edition  Clinical: Stage IIA (T1c, N1, cM0)  Summary: Stage IIA (T1c, N1, cM0)  PRIOR THERAPY: #1 Patient palpated a right breast mass. She had a mammogram that revealed a 1.8 cm lesion in the right breast. There was also noted to be enlarged lymph node by ultrasound. Patient could not tolerate MRI. 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 The biopsied right lymph node was positive for metastatic disease.   #2[name] on neoadjuvant chemotherapy consisting of Taxotere carboplatinum Herceptin/Perjeta beginning 02/22/2013. Total of 6 cycles of therapy is planned.   CURRENT THERAPY: Neoadjuvant cycle 3 day 1 of TCH/Perjeta due 04/06/2013  INTERVAL HISTORY: [name] 58 y.o. female returns for follow up visit. She returns today accompanied by her daughter. She's been receiving IV fluids and she's been having some issues with diarrhea. She continues to have diarrhea but seems to be slowing down. She had a very large episode at about 3:30 this morning. She did take Imodium after this episode. She had another episode at 6:30 this morning and again took more Imodium. She reports she had a very small episode just prior to coming to the office. She complains of pain in her left knee. She continues to complain of decreased appetite decreased by mouth intake. She complains of fatigue and just feels wiped out. Her blood sugars this morning  were 197 followed by 210 after breakfast and was 171 just prior to coming to the office. She is status post 2 cycles of her TCH/P. Overall she's doing  well. Her heartburn is well controlled on omeprazole. She also complains of mild fatigue. She denies any fever, chills, chest pain, blood in the stool blood in the urine, blurred vision, headaches, shortness of breath, dizziness. Remainder of the 10 point review of systems is negative.    MEDICAL HISTORY: Past Medical History  Diagnosis Date  . Hyperlipidemia   . Obesity   . Asthma     prn neb. and inhaler  . Osteoarthritis 2003    left knee  . Non-insulin dependent type 2 diabetes mellitus   . Graves' disease   . Ophthalmic manifestation of Graves disease   . Hypertension     on multiple meds., has been on med. > 14 yr.  . Breast cancer 01/2013    right    ALLERGIES:  is allergic to procardia.  MEDICATIONS:  Current Outpatient Prescriptions  Medication Sig Dispense Refill  . ACCU-CHEK SMARTVIEW test strip USE ONE STRIP TO CHECK GLUCOSE 4 TIMES DAILY  100 each  5  . albuterol (PROVENTIL HFA;VENTOLIN HFA) 108 (90 BASE) MCG/ACT inhaler Inhale 2 puffs into the lungs every 4 (four) hours as needed.  18 g  2  . albuterol (PROVENTIL) (5 MG/ML) 0.5% nebulizer solution Take 2.5 mg by nebulization every 6 (six) hours as needed for wheezing or shortness of breath.      Marland Kitchen amLODipine (NORVASC) 10 MG tablet TAKE ONE TABLET BY MOUTH ONCE DAILY.  90 tablet  0  . Artificial Tear Ointment (ARTIFICIAL TEARS) ointment Place 1 drop into both eyes as needed.       Marland Kitchen  aspirin 81 MG tablet Take 81 mg by mouth daily.      . benazepril (LOTENSIN) 40 MG tablet TAKE ONE TABLET BY MOUTH ONCE DAILY.  30 tablet  5  . cloNIDine (CATAPRES - DOSED IN MG/24 HR) 0.2 mg/24hr patch Place 1 patch (0.2 mg total) onto the skin once a week.  4 patch  2  . cycloSPORINE (RESTASIS) 0.05 % ophthalmic emulsion Place 1 drop into both eyes 2 (two) times daily.       Marland Kitchen dexamethasone (DECADRON) 4 MG tablet       . hydrochlorothiazide (HYDRODIURIL) 25 MG tablet TAKE ONE TABLET BY MOUTH ONCE DAILY.  30 tablet  0  . labetalol  (NORMODYNE) 300 MG tablet TAKE (1) TABLET BY MOUTH TWICE DAILY.  60 tablet  3  . LANTUS SOLOSTAR 100 UNIT/ML Solostar Pen Inject 5 Units into the skin. Take 5 units in am.      . levothyroxine (SYNTHROID, LEVOTHROID) 100 MCG tablet Take 100 mcg by mouth every morning.      . lidocaine-prilocaine (EMLA) cream Apply 1 application topically as needed.  30 g  0  . metFORMIN (GLUCOPHAGE) 500 MG tablet Take 1 tablet (500 mg total) by mouth 2 (two) times daily with a meal.  60 tablet  6  . omeprazole (PRILOSEC) 20 MG capsule Take 1 capsule (20 mg total) by mouth daily.  30 capsule  1  . ondansetron (ZOFRAN ODT) 4 MG disintegrating tablet Take 1 tablet (4 mg total) by mouth every 8 (eight) hours as needed for nausea.  10 tablet  0  . pravastatin (PRAVACHOL) 80 MG tablet Take 1 tablet (80 mg total) by mouth at bedtime.  30 tablet  6  . prochlorperazine (COMPAZINE) 10 MG tablet       . Biotin 1000 MCG tablet Take 1,000 mcg by mouth daily.      . fluconazole (DIFLUCAN) 100 MG tablet Take 2 tablets by mouth on day one, then take 1 tablet by mouth daily until completed  16 tablet  0  . oxyCODONE-acetaminophen (ROXICET) 5-325 MG per tablet Take 1-2 tablets by mouth every 4 (four) hours as needed.  30 tablet  0   No current facility-administered medications for this visit.   Facility-Administered Medications Ordered in Other Visits  Medication Dose Route Frequency Provider Last Rate Last Dose  . sodium chloride 0.9 % injection 10 mL  10 mL Intravenous PRN Deatra Robinson, MD   10 mL at 03/24/13 1625    SURGICAL HISTORY:  Past Surgical History  Procedure Laterality Date  . Total knee arthroplasty Left 2003  . Total thyroidectomy    . Colonoscopy  2007  . Tubal ligation  1985  . Colonoscopy w/ polypectomy  2009  . Portacath placement Right 02/17/2013    Procedure: INSERTION PORT-A-CATH;  Surgeon: Joyice Faster. Cornett, MD;  Location: [facility];  Service: General;  Laterality: Right;    REVIEW  OF SYSTEMS:  Pertinent items are noted in HPI.     PHYSICAL EXAMINATION: Blood pressure 138/82, pulse 90, temperature 97.8 F (36.6 C), temperature source Oral, resp. rate 19, height 5' 4.5" (1.638 m), weight 258 lb 12.8 oz (117.391 kg). Body mass index is 43.75 kg/(m^2).  ECOG PERFORMANCE STATUS: 1 - Symptomatic but completely ambulatory  HEENT PERRLA, sclera anicteric, conjunctiva no pallor, neck supple no JVD, no thyromegaly   General appearance: alert, cooperative and appears older than stated age Resp: clear to auscultation bilaterally Cardio: regular rate and  rhythm GI: soft, non-tender; bowel sounds normal; no masses,  no organomegaly Extremities: extremities normal, atraumatic, no cyanosis or edema Neurologic: Grossly normal Breasts: exam deferred  Mouth: Reveals moderate thrush  LABORATORY DATA: Lab Results  Component Value Date   WBC 10.2 03/22/2013   HGB 11.8 03/22/2013   HCT 35.5 03/22/2013   MCV 82.2 03/22/2013   PLT 162 03/22/2013      Chemistry      Component Value Date/Time   NA 136 03/22/2013 0829   NA 137 03/19/2013 0247   K 3.2* 03/22/2013 0829   K 3.8 03/19/2013 0247   CL 92* 03/19/2013 0247   CO2 24 03/22/2013 0829   CO2 30 03/19/2013 0247   BUN 13.1 03/22/2013 0829   BUN 20 03/19/2013 0247   CREATININE 1.2* 03/22/2013 0829   CREATININE 0.92 03/19/2013 0247   CREATININE 0.96 10/06/2012 0830      Component Value Date/Time   CALCIUM 9.0 03/22/2013 0829   CALCIUM 8.9 03/19/2013 0247   ALKPHOS 116 03/22/2013 0829   ALKPHOS 112 03/19/2013 0247   AST 25 03/22/2013 0829   AST 21 03/19/2013 0247   ALT 33 03/22/2013 0829   ALT 24 03/19/2013 0247   BILITOT 0.52 03/22/2013 0829   BILITOT 0.8 03/19/2013 0247       RADIOGRAPHIC STUDIES:  Chest 2 View  02/15/2013   CLINICAL DATA:  Preop port placement.  Breast cancer.  EXAM: CHEST  2 VIEW  COMPARISON:  None.  FINDINGS: The heart size and mediastinal contours are within normal limits. Both lungs are clear. The visualized skeletal  structures are unremarkable.  Surgical clips in the thyroid bilaterally. Thoracic osteophytes diffusely.  IMPRESSION: No active cardiopulmonary disease.   Electronically Signed   By: Franchot Gallo M.D.   On: 02/15/2013 14:10   Ct Chest W Contrast  02/25/2013   CLINICAL DATA:  New diagnosis of right breast cancer. Chemotherapy in progress.  EXAM: CT CHEST, ABDOMEN, AND PELVIS WITH CONTRAST  TECHNIQUE: Multidetector CT imaging of the chest, abdomen and pelvis was performed following the standard protocol during bolus administration of intravenous contrast.  CONTRAST:  158m OMNIPAQUE IOHEXOL 300 MG/ML  SOLN  COMPARISON:  PET-CT performed today.  FINDINGS: CT CHEST FINDINGS  There is a biopsy clip within a 1.6 cm mass laterally in the right breast on image 19. Small adjacent inferior right axillary lymph nodes are not pathologically enlarged, although are mildly hypermetabolic on today's PET-CT. These include a 10 mm node on image 17 and an 8 mm node on image 19.  There are no enlarged internal mammary, mediastinal or hilar lymph nodes. Postsurgical changes are noted at the thoracic inlet consistent with prior thyroid resection. There is a right IJ central venous catheter with its tip at the SVC right atrial junction.  There is mild atherosclerosis of the aorta and coronary arteries. The heart size is normal. There is no pleural or pericardial effusion.  The lungs are clear.  There are no worrisome osseous findings.  CT ABDOMEN AND PELVIS FINDINGS  The liver appears normal without evidence of metastatic disease. There are small calcified gallstones within the gallbladder lumen. The spleen, pancreas and adrenal glands appear normal. There is a 1.5 cm parapelvic cyst inferiorly in the left kidney. The right kidney appears normal. There is no hydronephrosis.  The stomach, small bowel, appendix and colon demonstrate no significant findings. There are no enlarged abdominal pelvic lymph nodes. There is mild  atherosclerosis of the aorta and iliac arteries.  The uterus is lobulated with small calcifications consistent with degenerated fibroids. There is no adnexal mass. The bladder appears normal. There is a large phlebolith or calcified lymph node inferiorly in the left pelvis.  There are no worrisome osseous findings. There are degenerative changes throughout the spine. In addition, there are degenerative changes at the hips and sacroiliac joints bilaterally.  IMPRESSION: 1. Biopsy proven right breast cancer with probable small adjacent nodal metastases. 2. No distant/hematogenous metastatic disease identified. 3. Cholelithiasis, left renal parapelvic cyst and uterine fibroids noted. 4. Degenerative changes in the spine, hips and sacroiliac joints.   Electronically Signed   By: Camie Patience M.D.   On: 02/25/2013 13:05   Ct Abdomen Pelvis W Contrast  02/25/2013   CLINICAL DATA:  New diagnosis of right breast cancer. Chemotherapy in progress.  EXAM: CT CHEST, ABDOMEN, AND PELVIS WITH CONTRAST  TECHNIQUE: Multidetector CT imaging of the chest, abdomen and pelvis was performed following the standard protocol during bolus administration of intravenous contrast.  CONTRAST:  141m OMNIPAQUE IOHEXOL 300 MG/ML  SOLN  COMPARISON:  PET-CT performed today.  FINDINGS: CT CHEST FINDINGS  There is a biopsy clip within a 1.6 cm mass laterally in the right breast on image 19. Small adjacent inferior right axillary lymph nodes are not pathologically enlarged, although are mildly hypermetabolic on today's PET-CT. These include a 10 mm node on image 17 and an 8 mm node on image 19.  There are no enlarged internal mammary, mediastinal or hilar lymph nodes. Postsurgical changes are noted at the thoracic inlet consistent with prior thyroid resection. There is a right IJ central venous catheter with its tip at the SVC right atrial junction.  There is mild atherosclerosis of the aorta and coronary arteries. The heart size is normal. There  is no pleural or pericardial effusion.  The lungs are clear.  There are no worrisome osseous findings.  CT ABDOMEN AND PELVIS FINDINGS  The liver appears normal without evidence of metastatic disease. There are small calcified gallstones within the gallbladder lumen. The spleen, pancreas and adrenal glands appear normal. There is a 1.5 cm parapelvic cyst inferiorly in the left kidney. The right kidney appears normal. There is no hydronephrosis.  The stomach, small bowel, appendix and colon demonstrate no significant findings. There are no enlarged abdominal pelvic lymph nodes. There is mild atherosclerosis of the aorta and iliac arteries.  The uterus is lobulated with small calcifications consistent with degenerated fibroids. There is no adnexal mass. The bladder appears normal. There is a large phlebolith or calcified lymph node inferiorly in the left pelvis.  There are no worrisome osseous findings. There are degenerative changes throughout the spine. In addition, there are degenerative changes at the hips and sacroiliac joints bilaterally.  IMPRESSION: 1. Biopsy proven right breast cancer with probable small adjacent nodal metastases. 2. No distant/hematogenous metastatic disease identified. 3. Cholelithiasis, left renal parapelvic cyst and uterine fibroids noted. 4. Degenerative changes in the spine, hips and sacroiliac joints.   Electronically Signed   By: BCamie PatienceM.D.   On: 02/25/2013 13:05   Nm Pet Image Initial (pi) Skull Base To Thigh  02/25/2013   CLINICAL DATA:  Initial treatment strategy for HER-2 positive, node positive right breast cancer.  EXAM: NUCLEAR MEDICINE PET SKULL BASE TO THIGH  FASTING BLOOD GLUCOSE:  Value: 189 mg/dl  TECHNIQUE: 12.2 mCi F-18 FDG was injected intravenously. Full-ring PET imaging was performed from the skull base to thigh after the radiotracer. CT data was obtained  and used for attenuation correction and anatomic localization.  COMPARISON:  [place] CORE BIOPSY dated  01/26/2013; CT CHEST W/CM dated 02/25/2013; CT ABD/PELVIS W CM dated 02/25/2013  FINDINGS: NECK  No hypermetabolic cervical lymph nodes are identified.There are no lesions of the pharyngeal mucosal space.  CHEST  The approximately 1.7 cm mass laterally in the right breast on image 67 is hypermetabolic with SUV max 36.14. Small nodes inferiorly in the right axilla are also mildly hypermetabolic with an SUV max of 2.67. There are no hypermetabolic internal mammary, mediastinal or hilar lymph nodes. There is no hypermetabolic pulmonary activity. CT findings are deferred to the diagnostic study of the same date.  ABDOMEN/PELVIS  There is no hypermetabolic activity within the liver, adrenal glands, spleen or pancreas. There is no hypermetabolic nodal activity. CT findings are deferred to the diagnostic study of the same date.  SKELETON  There is no hypermetabolic activity to suggest osseous metastatic disease.  IMPRESSION: 1. The right lateral breast cancer is hypermetabolic. There are small right axillary lymph nodes which are also hypermetabolic, suspicious for nodal metastases. 2. No evidence of distant/hematogenous metastatic disease.   Electronically Signed   By: Camie Patience M.D.   On: 02/25/2013 13:06   Dg Chest Port 1 View  02/17/2013   CLINICAL DATA:  Port-A-Cath placement.  EXAM: PORTABLE CHEST - 1 VIEW  COMPARISON:  02/15/2013  FINDINGS: Right anterior chest wall, internal jugular, Port-A-Cath has its tip in the mid superior vena cava.  No pneumothorax.  Cardiac silhouette is normal in size. Normal mediastinal and hilar contours. Mild linear atelectasis in the left mid lung. Lungs are otherwise clear. Stable changes from previous thyroid surgery.  IMPRESSION: Right anterior chest wall Port-A-Cath tip is in the mid superior vena cava. No pneumothorax.   Electronically Signed   By: Lajean Manes M.D.   On: 02/17/2013 09:21   Dg Fluoro Guide Cv Line-no Report  02/17/2013   CLINICAL DATA: port placement   FLOURO  GUIDE CV LINE  Fluoroscopy was utilized by the requesting physician.  No radiographic  interpretation.     ASSESSMENT/PLAN:  58 year old female with  #1. Stage IIA (T1N1) right breast cancer for neoadjuvant chemotherapy  cycle 1 day of therapy with TCH/perjeta started on 02/22/2013. Patient has had chemotherapy teaching, understands risks and benefits and rational for chemotherapy.  #2 PET scan reports and also CAT scan of the abdomen and pelvis and chest  performed on 02/25/2013 and those revealed right lateral breast cancer and small right axillary lymph nodes which are hypermetabolic.No evidence of distant metastasis noted   #3 proceed with  cycle # 2 chemotherapy with TCH/perjeta very well. I have reviewed her labs with the patient.   #4 Diabetes: controlled and she is followed by her PCP  #5 Gastroesophageal reflux: On omeprazole  20 mg by mouth once daily 30-40 minutes prior to meals  #6 nausea is controlled with the medications  #7. Follow up: 2 week with blood work, office visit, toxicity check and next chemotherapy  #8. Oral candidiasis-a prescription for Diflucan was sent to the patient's pharmacy of record via [facility] Sc[facility]  #9. Dehydration secondary to am diarrhea, patient will receive IV fluids as previously scheduled   All questions were answered. The patient knows to call the clinic with any problems, questions or concerns. We can certainly see the patient much sooner if necessary.  I spent 20 minutes counseling the patient face to face. The total time spent in the appointment  was 30 minutes.   Awilda Metro E, PA-C  03/24/2013, 4:59 PM

## 2013-03-24 NOTE — Telephone Encounter (Signed)
, °

## 2013-03-24 NOTE — Patient Instructions (Signed)
Dehydration, Adult Dehydration means your body does not have as much fluid as it needs. Your kidneys, brain, and heart will not work properly without the right amount of fluids and salt.  HOME CARE  Ask your doctor how to replace body fluid losses (rehydrate).  Drink enough fluids to keep your pee (urine) clear or pale yellow.  Drink small amounts of fluids often if you feel sick to your stomach (nauseous) or throw up (vomit).  Eat like you normally do.  Avoid:  Foods or drinks high in sugar.  Bubbly (carbonated) drinks.  Juice.  Very hot or cold fluids.  Drinks with caffeine.  Fatty, greasy foods.  Alcohol.  Tobacco.  Eating too much.  Gelatin desserts.  Wash your hands to avoid spreading germs (bacteria, viruses).  Only take medicine as told by your doctor.  Keep all doctor visits as told. GET HELP RIGHT AWAY IF:   You cannot drink something without throwing up.  You get worse even with treatment.  Your vomit has blood in it or looks greenish.  Your poop (stool) has blood in it or looks black and tarry.  You have not peed in 6 to 8 hours.  You pee a small amount of very dark pee.  You have a fever.  You pass out (faint).  You have belly (abdominal) pain that gets worse or stays in one spot (localizes).  You have a rash, stiff neck, or bad headache.  You get easily annoyed, sleepy, or are hard to wake up.  You feel weak, dizzy, or very thirsty. MAKE SURE YOU:   Understand these instructions.  Will watch your condition.  Will get help right away if you are not doing well or get worse. Document Released: 10/26/2008 Document Revised: 03/24/2011 Document Reviewed: 08/19/2010 Exit[employer] Patient Information 2014 [employer].  Hypokalemia Hypokalemia means that the amount of potassium in the blood is lower than normal.Potassium is a chemical, called an electrolyte, that helps regulate the amount of fluid in the body. It also stimulates muscle  contraction and helps nerves function properly.Most of the body's potassium is inside of cells, and only a very small amount is in the blood. Because the amount in the blood is so small, minor changes can be life-threatening. CAUSES  Antibiotics.  Diarrhea or vomiting.  Using laxatives too much, which can cause diarrhea.  Chronic kidney disease.  Water pills (diuretics).  Eating disorders (bulimia).  Low magnesium level.  Sweating a lot. SIGNS AND SYMPTOMS  Weakness.  Constipation.  Fatigue.  Muscle cramps.  Mental confusion.  Skipped heartbeats or irregular heartbeat (palpitations).  Tingling or numbness. DIAGNOSIS  Your health care provider can diagnose hypokalemia with blood tests. In addition to checking your potassium level, your health care provider may also check other lab tests. TREATMENT Hypokalemia can be treated with potassium supplements taken by mouth or adjustments in your current medicines. If your potassium level is very low, you may need to get potassium through a vein (IV) and be monitored in the hospital. A diet high in potassium is also helpful. Foods high in potassium are:  Nuts, such as peanuts and pistachios.  Seeds, such as sunflower seeds and pumpkin seeds.  Peas, lentils, and lima beans.  Whole grain and bran cereals and breads.  Fresh fruit and vegetables, such as apricots, avocado, bananas, cantaloupe, kiwi, oranges, tomatoes, asparagus, and potatoes.  Orange and tomato juices.  Red meats.  Fruit yogurt. HOME CARE INSTRUCTIONS  Take all medicines as prescribed by your   health care provider.  Maintain a healthy diet by including nutritious food, such as fruits, vegetables, nuts, whole grains, and lean meats.  If you are taking a laxative, be sure to follow the directions on the label. SEEK MEDICAL CARE IF:  Your weakness gets worse.  You feel your heart pounding or racing.  You are vomiting or having diarrhea.  You are  diabetic and having trouble keeping your blood glucose in the normal range. SEEK IMMEDIATE MEDICAL CARE IF:  You have chest pain, shortness of breath, or dizziness.  You are vomiting or having diarrhea for more than 2 days.  You faint. MAKE SURE YOU:   Understand these instructions.  Will watch your condition.  Will get help right away if you are not doing well or get worse. Document Released: 12/30/2004 Document Revised: 10/20/2012 Document Reviewed: 07/02/2012 Exit[employer] Patient Information 2014 [employer].     

## 2013-03-25 NOTE — Patient Instructions (Signed)
Call our office if your symptoms persist or worsen Take the Diflucan as prescribed to treat the thrush in her mouth. This should improve your ability to eat Followup as previously scheduled with Dr. Humphrey Rolls

## 2013-03-27 ENCOUNTER — Encounter: Payer: Self-pay | Admitting: Oncology

## 2013-03-27 NOTE — Progress Notes (Signed)
OFFICE PROGRESS NOTE  CC  Lacey Battiest, MD [place] [address] Dr. Thea Silversmith  Dr. Erroll Luna  DIAGNOSIS: 58 year old female with new diagnosis of T1 N1 (stage II) right breast cancer.  STAGE:  Breast cancer of upper-outer quadrant of right female breast  Primary site: Breast (Right)  Staging method: AJCC 7th Edition  Clinical: Stage IIA (T1c, N1, cM0)  Summary: Stage IIA (T1c, N1, cM0)  PRIOR THERAPY: #1 Patient palpated a right breast mass. She had a mammogram that revealed a 1.8 cm lesion in the right breast. There was also noted to be enlarged lymph node by ultrasound. Patient could not tolerate MRI. 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 The biopsied right lymph node was positive for metastatic disease.   #2[name] on neoadjuvant chemotherapy consisting of Taxotere carboplatinum Herceptin/Perjeta beginning 02/22/2013. Total of 6 cycles of therapy is planned.   CURRENT THERAPY: Neoadjuvant cycle 2 day 8 of TCH/Perjeta  INTERVAL HISTORY: [name] 58 y.o. female returns for follow up visit after cycle 2 of TCH/P. administered on 03/15/2013. Her course is complicated by development of severe weakness fatigue nausea vomiting grade 2-3. She also has had severe diarrhea unable to keep anything down. She's denying peripheral paresthesias. She has not had any bleeding. She has been trying to push fluids and her antiemetics at home but is unable to keep anything down. She has not had any fevers chills or night sweats at home. Remainder of the 10 point review of systems is negative.  MEDICAL HISTORY: Past Medical History  Diagnosis Date  . Hyperlipidemia   . Obesity   . Asthma     prn neb. and inhaler  . Osteoarthritis 2003    left knee  . Non-insulin dependent type 2 diabetes mellitus   . Graves' disease   . Ophthalmic manifestation of Graves disease   . Hypertension      on multiple meds., has been on med. > 14 yr.  . Breast cancer 01/2013    right    ALLERGIES:  is allergic to procardia.  MEDICATIONS:  Current Outpatient Prescriptions  Medication Sig Dispense Refill  . ACCU-CHEK SMARTVIEW test strip USE ONE STRIP TO CHECK GLUCOSE 4 TIMES DAILY  100 each  5  . albuterol (PROVENTIL HFA;VENTOLIN HFA) 108 (90 BASE) MCG/ACT inhaler Inhale 2 puffs into the lungs every 4 (four) hours as needed.  18 g  2  . albuterol (PROVENTIL) (5 MG/ML) 0.5% nebulizer solution Take 2.5 mg by nebulization every 6 (six) hours as needed for wheezing or shortness of breath.      Marland Kitchen amLODipine (NORVASC) 10 MG tablet TAKE ONE TABLET BY MOUTH ONCE DAILY.  90 tablet  0  . Artificial Tear Ointment (ARTIFICIAL TEARS) ointment Place 1 drop into both eyes as needed.       Marland Kitchen aspirin 81 MG tablet Take 81 mg by mouth daily.      . benazepril (LOTENSIN) 40 MG tablet TAKE ONE TABLET BY MOUTH ONCE DAILY.  30 tablet  5  . Biotin 1000 MCG tablet Take 1,000 mcg by mouth daily.      . cloNIDine (CATAPRES - DOSED IN MG/24 HR) 0.2 mg/24hr patch Place 1 patch (0.2 mg total) onto the skin once a week.  4 patch  2  . cycloSPORINE (RESTASIS) 0.05 % ophthalmic emulsion Place 1 drop into both eyes 2 (two) times daily.       Marland Kitchen  dexamethasone (DECADRON) 4 MG tablet       . hydrochlorothiazide (HYDRODIURIL) 25 MG tablet TAKE ONE TABLET BY MOUTH ONCE DAILY.  30 tablet  0  . labetalol (NORMODYNE) 300 MG tablet TAKE (1) TABLET BY MOUTH TWICE DAILY.  60 tablet  3  . LANTUS SOLOSTAR 100 UNIT/ML Solostar Pen Inject 5 Units into the skin. Take 5 units in am.      . levothyroxine (SYNTHROID, LEVOTHROID) 100 MCG tablet Take 100 mcg by mouth every morning.      . lidocaine-prilocaine (EMLA) cream Apply 1 application topically as needed.  30 g  0  . metFORMIN (GLUCOPHAGE) 500 MG tablet Take 1 tablet (500 mg total) by mouth 2 (two) times daily with a meal.  60 tablet  6  . omeprazole (PRILOSEC) 20 MG capsule Take 1  capsule (20 mg total) by mouth daily.  30 capsule  1  . ondansetron (ZOFRAN ODT) 4 MG disintegrating tablet Take 1 tablet (4 mg total) by mouth every 8 (eight) hours as needed for nausea.  10 tablet  0  . oxyCODONE-acetaminophen (ROXICET) 5-325 MG per tablet Take 1-2 tablets by mouth every 4 (four) hours as needed.  30 tablet  0  . pravastatin (PRAVACHOL) 80 MG tablet Take 1 tablet (80 mg total) by mouth at bedtime.  30 tablet  6  . prochlorperazine (COMPAZINE) 10 MG tablet       . fluconazole (DIFLUCAN) 100 MG tablet Take 2 tablets by mouth on day one, then take 1 tablet by mouth daily until completed  16 tablet  0   No current facility-administered medications for this visit.    SURGICAL HISTORY:  Past Surgical History  Procedure Laterality Date  . Total knee arthroplasty Left 2003  . Total thyroidectomy    . Colonoscopy  2007  . Tubal ligation  1985  . Colonoscopy w/ polypectomy  2009  . Portacath placement Right 02/17/2013    Procedure: INSERTION PORT-A-CATH;  Surgeon: Joyice Faster. Cornett, MD;  Location: [facility];  Service: General;  Laterality: Right;    REVIEW OF SYSTEMS:  Pertinent items are noted in HPI.     PHYSICAL EXAMINATION: Blood pressure 140/78, pulse 106, temperature 98.2 F (36.8 C), temperature source Oral, resp. rate 20, height 5' 4.5" (1.638 m), weight 0 lb (0 kg). Body mass index is 0.00 kg/(m^2). ECOG PERFORMANCE STATUS: 1 - Symptomatic but completely ambulatory HEENT PERRLA, sclera anicteric, conjunctiva no pallor, neck supple no JVD, no thyromegaly   General appearance: alert, cooperative and appears older than stated age Resp: clear to auscultation bilaterally Cardio: regular rate and rhythm GI: soft, non-tender; bowel sounds normal; no masses,  no organomegaly Extremities: extremities normal, atraumatic, no cyanosis or edema Neurologic: Grossly normal Breasts: left breast normal without mass, skin or nipple changes or axillary nodes,  abnormal mass palpable in the right breast. No tenderness or skin changes.   LABORATORY DATA: Lab Results  Component Value Date   WBC 10.2 03/22/2013   HGB 11.8 03/22/2013   HCT 35.5 03/22/2013   MCV 82.2 03/22/2013   PLT 162 03/22/2013      Chemistry      Component Value Date/Time   NA 136 03/22/2013 0829   NA 137 03/19/2013 0247   K 3.2* 03/22/2013 0829   K 3.8 03/19/2013 0247   CL 92* 03/19/2013 0247   CO2 24 03/22/2013 0829   CO2 30 03/19/2013 0247   BUN 13.1 03/22/2013 0829   BUN 20 03/19/2013  0247   CREATININE 1.2* 03/22/2013 0829   CREATININE 0.92 03/19/2013 0247   CREATININE 0.96 10/06/2012 0830      Component Value Date/Time   CALCIUM 9.0 03/22/2013 0829   CALCIUM 8.9 03/19/2013 0247   ALKPHOS 116 03/22/2013 0829   ALKPHOS 112 03/19/2013 0247   AST 25 03/22/2013 0829   AST 21 03/19/2013 0247   ALT 33 03/22/2013 0829   ALT 24 03/19/2013 0247   BILITOT 0.52 03/22/2013 0829   BILITOT 0.8 03/19/2013 0247       RADIOGRAPHIC STUDIES:  Chest 2 View  02/15/2013   CLINICAL DATA:  Preop port placement.  Breast cancer.  EXAM: CHEST  2 VIEW  COMPARISON:  None.  FINDINGS: The heart size and mediastinal contours are within normal limits. Both lungs are clear. The visualized skeletal structures are unremarkable.  Surgical clips in the thyroid bilaterally. Thoracic osteophytes diffusely.  IMPRESSION: No active cardiopulmonary disease.   Electronically Signed   By: Franchot Gallo M.D.   On: 02/15/2013 14:10   Ct Chest W Contrast  02/25/2013   CLINICAL DATA:  New diagnosis of right breast cancer. Chemotherapy in progress.  EXAM: CT CHEST, ABDOMEN, AND PELVIS WITH CONTRAST  TECHNIQUE: Multidetector CT imaging of the chest, abdomen and pelvis was performed following the standard protocol during bolus administration of intravenous contrast.  CONTRAST:  137m OMNIPAQUE IOHEXOL 300 MG/ML  SOLN  COMPARISON:  PET-CT performed today.  FINDINGS: CT CHEST FINDINGS  There is a biopsy clip within a 1.6 cm mass laterally in  the right breast on image 19. Small adjacent inferior right axillary lymph nodes are not pathologically enlarged, although are mildly hypermetabolic on today's PET-CT. These include a 10 mm node on image 17 and an 8 mm node on image 19.  There are no enlarged internal mammary, mediastinal or hilar lymph nodes. Postsurgical changes are noted at the thoracic inlet consistent with prior thyroid resection. There is a right IJ central venous catheter with its tip at the SVC right atrial junction.  There is mild atherosclerosis of the aorta and coronary arteries. The heart size is normal. There is no pleural or pericardial effusion.  The lungs are clear.  There are no worrisome osseous findings.  CT ABDOMEN AND PELVIS FINDINGS  The liver appears normal without evidence of metastatic disease. There are small calcified gallstones within the gallbladder lumen. The spleen, pancreas and adrenal glands appear normal. There is a 1.5 cm parapelvic cyst inferiorly in the left kidney. The right kidney appears normal. There is no hydronephrosis.  The stomach, small bowel, appendix and colon demonstrate no significant findings. There are no enlarged abdominal pelvic lymph nodes. There is mild atherosclerosis of the aorta and iliac arteries.  The uterus is lobulated with small calcifications consistent with degenerated fibroids. There is no adnexal mass. The bladder appears normal. There is a large phlebolith or calcified lymph node inferiorly in the left pelvis.  There are no worrisome osseous findings. There are degenerative changes throughout the spine. In addition, there are degenerative changes at the hips and sacroiliac joints bilaterally.  IMPRESSION: 1. Biopsy proven right breast cancer with probable small adjacent nodal metastases. 2. No distant/hematogenous metastatic disease identified. 3. Cholelithiasis, left renal parapelvic cyst and uterine fibroids noted. 4. Degenerative changes in the spine, hips and sacroiliac joints.    Electronically Signed   By: BCamie PatienceM.D.   On: 02/25/2013 13:05   Ct Abdomen Pelvis W Contrast  02/25/2013   CLINICAL DATA:  New diagnosis of right breast cancer. Chemotherapy in progress.  EXAM: CT CHEST, ABDOMEN, AND PELVIS WITH CONTRAST  TECHNIQUE: Multidetector CT imaging of the chest, abdomen and pelvis was performed following the standard protocol during bolus administration of intravenous contrast.  CONTRAST:  176m OMNIPAQUE IOHEXOL 300 MG/ML  SOLN  COMPARISON:  PET-CT performed today.  FINDINGS: CT CHEST FINDINGS  There is a biopsy clip within a 1.6 cm mass laterally in the right breast on image 19. Small adjacent inferior right axillary lymph nodes are not pathologically enlarged, although are mildly hypermetabolic on today's PET-CT. These include a 10 mm node on image 17 and an 8 mm node on image 19.  There are no enlarged internal mammary, mediastinal or hilar lymph nodes. Postsurgical changes are noted at the thoracic inlet consistent with prior thyroid resection. There is a right IJ central venous catheter with its tip at the SVC right atrial junction.  There is mild atherosclerosis of the aorta and coronary arteries. The heart size is normal. There is no pleural or pericardial effusion.  The lungs are clear.  There are no worrisome osseous findings.  CT ABDOMEN AND PELVIS FINDINGS  The liver appears normal without evidence of metastatic disease. There are small calcified gallstones within the gallbladder lumen. The spleen, pancreas and adrenal glands appear normal. There is a 1.5 cm parapelvic cyst inferiorly in the left kidney. The right kidney appears normal. There is no hydronephrosis.  The stomach, small bowel, appendix and colon demonstrate no significant findings. There are no enlarged abdominal pelvic lymph nodes. There is mild atherosclerosis of the aorta and iliac arteries.  The uterus is lobulated with small calcifications consistent with degenerated fibroids. There is no adnexal  mass. The bladder appears normal. There is a large phlebolith or calcified lymph node inferiorly in the left pelvis.  There are no worrisome osseous findings. There are degenerative changes throughout the spine. In addition, there are degenerative changes at the hips and sacroiliac joints bilaterally.  IMPRESSION: 1. Biopsy proven right breast cancer with probable small adjacent nodal metastases. 2. No distant/hematogenous metastatic disease identified. 3. Cholelithiasis, left renal parapelvic cyst and uterine fibroids noted. 4. Degenerative changes in the spine, hips and sacroiliac joints.   Electronically Signed   By: BCamie PatienceM.D.   On: 02/25/2013 13:05   Nm Pet Image Initial (pi) Skull Base To Thigh  02/25/2013   CLINICAL DATA:  Initial treatment strategy for HER-2 positive, node positive right breast cancer.  EXAM: NUCLEAR MEDICINE PET SKULL BASE TO THIGH  FASTING BLOOD GLUCOSE:  Value: 189 mg/dl  TECHNIQUE: 12.2 mCi F-18 FDG was injected intravenously. Full-ring PET imaging was performed from the skull base to thigh after the radiotracer. CT data was obtained and used for attenuation correction and anatomic localization.  COMPARISON:  [place] BI[place] dated 01/26/2013; CT CHEST W/CM dated 02/25/2013; CT ABD/PELVIS W CM dated 02/25/2013  FINDINGS: NECK  No hypermetabolic cervical lymph nodes are identified.There are no lesions of the pharyngeal mucosal space.  CHEST  The approximately 1.7 cm mass laterally in the right breast on image 67 is hypermetabolic with SUV max 116.10 Small nodes inferiorly in the right axilla are also mildly hypermetabolic with an SUV max of 2.67. There are no hypermetabolic internal mammary, mediastinal or hilar lymph nodes. There is no hypermetabolic pulmonary activity. CT findings are deferred to the diagnostic study of the same date.  ABDOMEN/PELVIS  There is no hypermetabolic activity within the liver, adrenal glands, spleen or pancreas. There  is no hypermetabolic nodal activity.  CT findings are deferred to the diagnostic study of the same date.  SKELETON  There is no hypermetabolic activity to suggest osseous metastatic disease.  IMPRESSION: 1. The right lateral breast cancer is hypermetabolic. There are small right axillary lymph nodes which are also hypermetabolic, suspicious for nodal metastases. 2. No evidence of distant/hematogenous metastatic disease.   Electronically Signed   By: Camie Patience M.D.   On: 02/25/2013 13:06   Dg Chest Port 1 View  02/17/2013   CLINICAL DATA:  Port-A-Cath placement.  EXAM: PORTABLE CHEST - 1 VIEW  COMPARISON:  02/15/2013  FINDINGS: Right anterior chest wall, internal jugular, Port-A-Cath has its tip in the mid superior vena cava.  No pneumothorax.  Cardiac silhouette is normal in size. Normal mediastinal and hilar contours. Mild linear atelectasis in the left mid lung. Lungs are otherwise clear. Stable changes from previous thyroid surgery.  IMPRESSION: Right anterior chest wall Port-A-Cath tip is in the mid superior vena cava. No pneumothorax.   Electronically Signed   By: Lajean Manes M.D.   On: 02/17/2013 09:21   Dg Fluoro Guide Cv Line-no Report  02/17/2013   CLINICAL DATA: port placement   FLOURO GUIDE CV LINE  Fluoroscopy was utilized by the requesting physician.  No radiographic  interpretation.     ASSESSMENT/PLAN:  58 year old female with  #1. Stage IIA (T1N1) right breast cancer for neoadjuvant chemotherapy  cycle 1 day of therapy with TCH/perjeta started on 02/22/2013. Patient has had chemotherapy teaching, understands risks and benefits and rational for chemotherapy.  #2 PET scan reports and also CAT scan of the abdomen and pelvis and chest  performed on 02/25/2013 and those revealed right lateral breast cancer and small right axillary lymph nodes which are hypermetabolic.No evidence of distant metastasis noted   #3 status post  cycle # 2 chemotherapy with TCH/perjeta given on 03/15/2013. Today is day 8. She has experienced  significant GI toxicity. Patient is dehydrated, she had severe diarrhea, grade 2-3 nausea and vomiting. I have recommended proceeding with IV fluids antiemetics consisting of Zofran. She is also encouraged to take Imodium. We will continue to bring her in to the clinic for the next few days for IV fluids and anti-emetics. If needed patient may need to be admitted we will try to do all of this as an outpatient to avoid an admission to her. Patient is instructed to call us with any problems once she is discharged to home after receiving IV fluids. She will also be seen back on Thursday in followup for reevaluation  #4 Diabetes: Patient has had significant problems with control of her diabetes. I have made some changes in her regimen. We will also continue to keep her PCP her breasts so that we can continue to have her diabetes under well-controlled while she is on her chemotherapy.  #5 Gastroesophageal reflux: Patient looks that you need to take omeprazole  #6 nausea is controlled with the medications  #7. Follow up: In 2-3 days for followup., Continue IV fluids over the next few days. She will return again on 04/06/2013 for cycle 3 day 1 of chemotherapy  All questions were answered. The patient knows to call the clinic with any problems, questions or concerns. We can certainly see the patient much sooner if necessary.  I spent 20 minutes counseling the patient face to face. The total time spent in the appointment was 30 minutes.   Marcy Panning, MD Medical/Oncology [facility] [phone] (beeper) [phone] (Office)

## 2013-03-28 NOTE — Progress Notes (Signed)
Late entry for DOS; 03/24/2013. Per MAR, postassium started at 1423hr and saline flush at 1625hr, infusion over 2hrs. HL

## 2013-04-01 ENCOUNTER — Ambulatory Visit (INDEPENDENT_AMBULATORY_CARE_PROVIDER_SITE_OTHER): Payer: Medicare Other | Admitting: Family Medicine

## 2013-04-01 ENCOUNTER — Other Ambulatory Visit: Payer: Self-pay | Admitting: Family Medicine

## 2013-04-01 ENCOUNTER — Encounter: Payer: Self-pay | Admitting: Family Medicine

## 2013-04-01 VITALS — BP 128/70 | Ht 64.5 in | Wt 257.0 lb

## 2013-04-01 DIAGNOSIS — E119 Type 2 diabetes mellitus without complications: Secondary | ICD-10-CM

## 2013-04-01 DIAGNOSIS — E785 Hyperlipidemia, unspecified: Secondary | ICD-10-CM

## 2013-04-01 DIAGNOSIS — Z79899 Other long term (current) drug therapy: Secondary | ICD-10-CM

## 2013-04-01 DIAGNOSIS — C50911 Malignant neoplasm of unspecified site of right female breast: Secondary | ICD-10-CM

## 2013-04-01 DIAGNOSIS — C50919 Malignant neoplasm of unspecified site of unspecified female breast: Secondary | ICD-10-CM

## 2013-04-01 LAB — POCT GLYCOSYLATED HEMOGLOBIN (HGB A1C): Hemoglobin A1C: 12

## 2013-04-01 NOTE — Patient Instructions (Signed)
New lantus regimen (pending visit to your specialist)  For fasting sugar under 120, use none  For fasting sugar 120 to 200 use 8 units  For fasting sugar 201 to 300 use 14 units  For fasting sugar greater than 300 use 16 units

## 2013-04-01 NOTE — Progress Notes (Signed)
   Subjective:    Patient ID: [name], female    DOB: [birth year], 59 y.o.   MRN: [id number]  Diabetes She presents for her follow-up diabetic visit. She has type 2 diabetes mellitus. Her overall blood glucose range is >200 mg/dl. She does not see a podiatrist.Eye exam is not current.   Patient was in ER for high blood sugar. Taking steroids. Found out in January she has breast cancer. She is doing chemo.   Last few days sugars have been elevated, partic when taking steroids. Currently followed by the diabetes specialist. But having a difficult time getting down there.  Intermittent nausea with chemotherapy.  Diagnosed with breast cancer. Overall handling management well at this time.  Was given Lantus for chemotherapy but just using a several days around edema.  Current lantus dose, told to do eight units if over 200, still very high.  Chemo treatments for three more three wk cycles  diab assistant rec adding lantus because of elevated numbers,  Next number after 7 units was 400  Review of Systems No headache no chest pain no back pain no abdominal pain no change about habits no blood in stool ROS otherwise negative.    Objective:   Physical Exam Alert no apparent distress. Scalp bald secondary to chemotherapy. Swelling lungs clear. Heart regular in rhythm. HEENT normal. Ankles trace edema.       Assessment & Plan:  Impression 1 diabetes poor control exacerbated in part to #2 #2 recent chemotherapy and concurrent chemotherapy for breast cancer. #3 hypertension stable #4 asthma stable plan insulin regimen written out for patient. Encouraged her to still followup with specialist. Multiple questions answered. 25 minutes spent most in discussion. See patient instruction for Lantus regimen WSL

## 2013-04-06 ENCOUNTER — Ambulatory Visit (HOSPITAL_BASED_OUTPATIENT_CLINIC_OR_DEPARTMENT_OTHER): Payer: Medicare Other

## 2013-04-06 ENCOUNTER — Other Ambulatory Visit (HOSPITAL_BASED_OUTPATIENT_CLINIC_OR_DEPARTMENT_OTHER): Payer: Medicare Other

## 2013-04-06 ENCOUNTER — Telehealth: Payer: Self-pay | Admitting: *Deleted

## 2013-04-06 ENCOUNTER — Ambulatory Visit (HOSPITAL_BASED_OUTPATIENT_CLINIC_OR_DEPARTMENT_OTHER): Payer: Medicare Other | Admitting: Oncology

## 2013-04-06 ENCOUNTER — Telehealth: Payer: Self-pay | Admitting: Oncology

## 2013-04-06 ENCOUNTER — Ambulatory Visit: Payer: Medicare Other | Admitting: Oncology

## 2013-04-06 ENCOUNTER — Encounter: Payer: Self-pay | Admitting: Oncology

## 2013-04-06 VITALS — BP 141/84 | HR 103 | Temp 98.6°F | Resp 18 | Ht 64.5 in | Wt 263.3 lb

## 2013-04-06 DIAGNOSIS — R944 Abnormal results of kidney function studies: Secondary | ICD-10-CM

## 2013-04-06 DIAGNOSIS — C50411 Malignant neoplasm of upper-outer quadrant of right female breast: Secondary | ICD-10-CM

## 2013-04-06 DIAGNOSIS — Z5112 Encounter for antineoplastic immunotherapy: Secondary | ICD-10-CM

## 2013-04-06 DIAGNOSIS — D6481 Anemia due to antineoplastic chemotherapy: Secondary | ICD-10-CM

## 2013-04-06 DIAGNOSIS — N289 Disorder of kidney and ureter, unspecified: Secondary | ICD-10-CM

## 2013-04-06 DIAGNOSIS — C50419 Malignant neoplasm of upper-outer quadrant of unspecified female breast: Secondary | ICD-10-CM

## 2013-04-06 DIAGNOSIS — E119 Type 2 diabetes mellitus without complications: Secondary | ICD-10-CM

## 2013-04-06 DIAGNOSIS — Z170 Estrogen receptor positive status [ER+]: Secondary | ICD-10-CM

## 2013-04-06 DIAGNOSIS — T451X5A Adverse effect of antineoplastic and immunosuppressive drugs, initial encounter: Secondary | ICD-10-CM

## 2013-04-06 DIAGNOSIS — E860 Dehydration: Secondary | ICD-10-CM

## 2013-04-06 DIAGNOSIS — Z5111 Encounter for antineoplastic chemotherapy: Secondary | ICD-10-CM

## 2013-04-06 DIAGNOSIS — M1990 Unspecified osteoarthritis, unspecified site: Secondary | ICD-10-CM

## 2013-04-06 DIAGNOSIS — D509 Iron deficiency anemia, unspecified: Secondary | ICD-10-CM

## 2013-04-06 LAB — CBC WITH DIFFERENTIAL/PLATELET
BASO%: 0.4 % (ref 0.0–2.0)
BASOS ABS: 0 10*3/uL (ref 0.0–0.1)
EOS ABS: 0 10*3/uL (ref 0.0–0.5)
EOS%: 0 % (ref 0.0–7.0)
HCT: 31.2 % — ABNORMAL LOW (ref 34.8–46.6)
HEMOGLOBIN: 10.3 g/dL — AB (ref 11.6–15.9)
LYMPH%: 8.9 % — ABNORMAL LOW (ref 14.0–49.7)
MCH: 27.9 pg (ref 25.1–34.0)
MCHC: 33 g/dL (ref 31.5–36.0)
MCV: 84.6 fL (ref 79.5–101.0)
MONO#: 0.4 10*3/uL (ref 0.1–0.9)
MONO%: 5.5 % (ref 0.0–14.0)
NEUT#: 6.9 10*3/uL — ABNORMAL HIGH (ref 1.5–6.5)
NEUT%: 85.2 % — AB (ref 38.4–76.8)
PLATELETS: 212 10*3/uL (ref 145–400)
RBC: 3.68 10*6/uL — ABNORMAL LOW (ref 3.70–5.45)
RDW: 19.5 % — ABNORMAL HIGH (ref 11.2–14.5)
WBC: 8.1 10*3/uL (ref 3.9–10.3)
lymph#: 0.7 10*3/uL — ABNORMAL LOW (ref 0.9–3.3)

## 2013-04-06 LAB — COMPREHENSIVE METABOLIC PANEL (CC13)
ALT: 13 U/L (ref 0–55)
ANION GAP: 17 meq/L — AB (ref 3–11)
AST: 17 U/L (ref 5–34)
Albumin: 3.8 g/dL (ref 3.5–5.0)
Alkaline Phosphatase: 107 U/L (ref 40–150)
BILIRUBIN TOTAL: 0.3 mg/dL (ref 0.20–1.20)
BUN: 20 mg/dL (ref 7.0–26.0)
CO2: 21 meq/L — AB (ref 22–29)
Calcium: 8.3 mg/dL — ABNORMAL LOW (ref 8.4–10.4)
Chloride: 107 mEq/L (ref 98–109)
Creatinine: 1.2 mg/dL — ABNORMAL HIGH (ref 0.6–1.1)
GLUCOSE: 255 mg/dL — AB (ref 70–140)
Potassium: 4.1 mEq/L (ref 3.5–5.1)
SODIUM: 145 meq/L (ref 136–145)
TOTAL PROTEIN: 7.3 g/dL (ref 6.4–8.3)

## 2013-04-06 MED ORDER — SODIUM CHLORIDE 0.9 % IV SOLN
Freq: Once | INTRAVENOUS | Status: AC
  Administered 2013-04-06: 10:00:00 via INTRAVENOUS

## 2013-04-06 MED ORDER — DEXAMETHASONE SODIUM PHOSPHATE 20 MG/5ML IJ SOLN
20.0000 mg | Freq: Once | INTRAMUSCULAR | Status: AC
  Administered 2013-04-06: 20 mg via INTRAVENOUS

## 2013-04-06 MED ORDER — SODIUM CHLORIDE 0.9 % IV SOLN
757.8000 mg | Freq: Once | INTRAVENOUS | Status: AC
  Administered 2013-04-06: 760 mg via INTRAVENOUS
  Filled 2013-04-06: qty 76

## 2013-04-06 MED ORDER — TRASTUZUMAB CHEMO INJECTION 440 MG
6.0000 mg/kg | Freq: Once | INTRAVENOUS | Status: AC
  Administered 2013-04-06: 756 mg via INTRAVENOUS
  Filled 2013-04-06: qty 36

## 2013-04-06 MED ORDER — SODIUM CHLORIDE 0.9 % IJ SOLN
10.0000 mL | INTRAMUSCULAR | Status: DC | PRN
  Administered 2013-04-06: 10 mL
  Filled 2013-04-06: qty 10

## 2013-04-06 MED ORDER — ACETAMINOPHEN 325 MG PO TABS
650.0000 mg | ORAL_TABLET | Freq: Once | ORAL | Status: AC
  Administered 2013-04-06: 650 mg via ORAL

## 2013-04-06 MED ORDER — DEXAMETHASONE SODIUM PHOSPHATE 20 MG/5ML IJ SOLN
INTRAMUSCULAR | Status: AC
  Filled 2013-04-06: qty 5

## 2013-04-06 MED ORDER — ONDANSETRON 16 MG/50ML IVPB (CHCC)
INTRAVENOUS | Status: AC
  Filled 2013-04-06: qty 16

## 2013-04-06 MED ORDER — ACETAMINOPHEN 325 MG PO TABS
ORAL_TABLET | ORAL | Status: AC
  Filled 2013-04-06: qty 2

## 2013-04-06 MED ORDER — SODIUM CHLORIDE 0.9 % IV SOLN
75.0000 mg/m2 | Freq: Once | INTRAVENOUS | Status: AC
  Administered 2013-04-06: 180 mg via INTRAVENOUS
  Filled 2013-04-06: qty 18

## 2013-04-06 MED ORDER — HEPARIN SOD (PORK) LOCK FLUSH 100 UNIT/ML IV SOLN
500.0000 [IU] | Freq: Once | INTRAVENOUS | Status: AC | PRN
Start: 2013-04-06 — End: 2013-04-06
  Administered 2013-04-06: 500 [IU]
  Filled 2013-04-06: qty 5

## 2013-04-06 MED ORDER — DIPHENHYDRAMINE HCL 25 MG PO CAPS
50.0000 mg | ORAL_CAPSULE | Freq: Once | ORAL | Status: AC
  Administered 2013-04-06: 50 mg via ORAL

## 2013-04-06 MED ORDER — SODIUM CHLORIDE 0.9 % IV SOLN
420.0000 mg | Freq: Once | INTRAVENOUS | Status: AC
  Administered 2013-04-06: 420 mg via INTRAVENOUS
  Filled 2013-04-06: qty 14

## 2013-04-06 MED ORDER — DIPHENHYDRAMINE HCL 25 MG PO CAPS
ORAL_CAPSULE | ORAL | Status: AC
  Filled 2013-04-06: qty 2

## 2013-04-06 MED ORDER — ONDANSETRON 16 MG/50ML IVPB (CHCC)
16.0000 mg | Freq: Once | INTRAVENOUS | Status: AC
  Administered 2013-04-06: 16 mg via INTRAVENOUS

## 2013-04-06 NOTE — Patient Instructions (Signed)
[facility] Discharge Instructions for Patients Receiving Chemotherapy  Today you received the following chemotherapy agents:  Taxotere, Carboplatin, Herceptin and Perjeta  To help prevent nausea and vomiting after your treatment, we encourage you to take your nausea medication as ordered per MD.   If you develop nausea and vomiting that is not controlled by your nausea medication, call the clinic.   BELOW ARE SYMPTOMS THAT SHOULD BE REPORTED IMMEDIATELY:  *FEVER GREATER THAN 100.5 F  *CHILLS WITH OR WITHOUT FEVER  NAUSEA AND VOMITING THAT IS NOT CONTROLLED WITH YOUR NAUSEA MEDICATION  *UNUSUAL SHORTNESS OF BREATH  *UNUSUAL BRUISING OR BLEEDING  TENDERNESS IN MOUTH AND THROAT WITH OR WITHOUT PRESENCE OF ULCERS  *URINARY PROBLEMS  *BOWEL PROBLEMS  UNUSUAL RASH Items with * indicate a potential emergency and should be followed up as soon as possible.  Feel free to call the clinic you have any questions or concerns. The clinic phone number is [phone].    

## 2013-04-06 NOTE — Telephone Encounter (Signed)
Per staff message and POF I have scheduled appts.  JMW  

## 2013-04-06 NOTE — Telephone Encounter (Signed)
, °

## 2013-04-06 NOTE — Progress Notes (Signed)
[facility] OFFICE PROGRESS NOTE  Patient Care Team: Mikey Kirschner, MD as PCP - General (Family Medicine) Gearlean Alf, MD (Orthopedic Surgery) Dr. Thea Silversmith  Dr. Erroll Luna  DIAGNOSIS: 59 year old female with new diagnosis of T1 N1 (stage II) right breast cancer.  STAGE:  Breast cancer of upper-outer quadrant of right female breast  Primary site: Breast (Right)  Staging method: AJCC 7th Edition  Clinical: Stage IIA (T1c, N1, cM0)  Summary: Stage IIA (T1c, N1, cM0)  SUMMARY OF ONCOLOGIC HISTORY: #1 Patient palpated a right breast mass. She had a mammogram that revealed a 1.8 cm lesion in the right breast. There was also noted to be enlarged lymph node by ultrasound. Patient could not tolerate MRI. 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 The biopsied right lymph node was positive for metastatic disease.  #2[name] on neoadjuvant chemotherapy consisting of Taxotere carboplatinum Herceptin/Perjeta beginning 02/22/2013. Total of 6 cycles of therapy is planned.   CURRENT THERAPY: Neoadjuvant cycle 3 day 1 of TCH/Perjeta due 04/06/2013   INTERVAL HISTORY: DESIRIE MINTEER 58 y.o. female returns for followup visit and to begin cycle 3 of her chemotherapy. She feels much better her. Her diarrhea has resolved. She did get significant relief from her diarrhea from the Imodium that she has been taking. She also looks well-hydrated today. She is not complaining of any kind of pain. She has no fatigue. She tells me that the IV fluids that we give do help her quite a bit. In my plan is to get her to receive IV fluids 3 days postchemotherapy. She has no nausea vomiting no fevers chills or night sweats. She is denying any headaches double vision blurring of vision. She states that she no longer can feel the breast mass in her right breast. She is very thrilled with this.  I have reviewed the past medical  history, past surgical history, social history and family history with the patient and they are unchanged from previous note.  ALLERGIES:  is allergic to procardia.  MEDICATIONS:  Current Outpatient Prescriptions  Medication Sig Dispense Refill  . ACCU-CHEK SMARTVIEW test strip USE ONE STRIP TO CHECK GLUCOSE 4 TIMES DAILY  100 each  5  . albuterol (PROVENTIL HFA;VENTOLIN HFA) 108 (90 BASE) MCG/ACT inhaler Inhale 2 puffs into the lungs every 4 (four) hours as needed.  18 g  2  . albuterol (PROVENTIL) (5 MG/ML) 0.5% nebulizer solution Take 2.5 mg by nebulization every 6 (six) hours as needed for wheezing or shortness of breath.      Marland Kitchen amLODipine (NORVASC) 10 MG tablet TAKE ONE TABLET BY MOUTH ONCE DAILY.  90 tablet  0  . Artificial Tear Ointment (ARTIFICIAL TEARS) ointment Place 1 drop into both eyes as needed.       Marland Kitchen aspirin 81 MG tablet Take 81 mg by mouth daily.      . benazepril (LOTENSIN) 40 MG tablet TAKE ONE TABLET BY MOUTH ONCE DAILY.  30 tablet  5  . cloNIDine (CATAPRES - DOSED IN MG/24 HR) 0.2 mg/24hr patch Place 1 patch (0.2 mg total) onto the skin once a week.  4 patch  2  . cycloSPORINE (RESTASIS) 0.05 % ophthalmic emulsion Place 1 drop into both eyes 2 (two) times daily.       Marland Kitchen dexamethasone (DECADRON) 4 MG tablet       . hydrochlorothiazide (HYDRODIURIL) 25 MG tablet TAKE ONE TABLET BY MOUTH ONCE  DAILY.  30 tablet  2  . labetalol (NORMODYNE) 300 MG tablet TAKE (1) TABLET BY MOUTH TWICE DAILY.  60 tablet  3  . LANTUS SOLOSTAR 100 UNIT/ML Solostar Pen Inject 5 Units into the skin. Take 5 units in am.      . levothyroxine (SYNTHROID, LEVOTHROID) 100 MCG tablet Take 100 mcg by mouth every morning.      . lidocaine-prilocaine (EMLA) cream Apply 1 application topically as needed.  30 g  0  . metFORMIN (GLUCOPHAGE) 500 MG tablet Take 1 tablet (500 mg total) by mouth 2 (two) times daily with a meal.  60 tablet  6  . omeprazole (PRILOSEC) 20 MG capsule Take 1 capsule (20 mg total) by  mouth daily.  30 capsule  1  . ondansetron (ZOFRAN ODT) 4 MG disintegrating tablet Take 1 tablet (4 mg total) by mouth every 8 (eight) hours as needed for nausea.  10 tablet  0  . pravastatin (PRAVACHOL) 80 MG tablet Take 1 tablet (80 mg total) by mouth at bedtime.  30 tablet  6  . prochlorperazine (COMPAZINE) 10 MG tablet        No current facility-administered medications for this visit.   Facility-Administered Medications Ordered in Other Visits  Medication Dose Route Frequency Provider Last Rate Last Dose  . CARBOplatin (PARAPLATIN) 760 mg in sodium chloride 0.9 % 250 mL chemo infusion  760 mg Intravenous Once Deatra Robinson, MD      . DOCEtaxel (TAXOTERE) 180 mg in sodium chloride 0.9 % 250 mL chemo infusion  75 mg/m2 (Treatment Plan Actual) Intravenous Once Deatra Robinson, MD      . heparin lock flush 100 unit/mL  500 Units Intracatheter Once PRN Deatra Robinson, MD      . pertuzumab (PERJETA) 420 mg in sodium chloride 0.9 % 250 mL chemo infusion  420 mg Intravenous Once Deatra Robinson, MD 528 mL/hr at 04/06/13 1158 420 mg at 04/06/13 1158  . sodium chloride 0.9 % injection 10 mL  10 mL Intracatheter PRN Deatra Robinson, MD        REVIEW OF SYSTEMS:   Constitutional: Denies fevers, chills or abnormal weight loss Eyes: Denies blurriness of vision Ears, nose, mouth, throat, and face: Denies mucositis or sore throat Respiratory: Denies cough, dyspnea or wheezes Cardiovascular: Denies palpitation, chest discomfort or lower extremity swelling Gastrointestinal:  Denies nausea, heartburn or change in bowel habits Skin: Denies abnormal skin rashes Lymphatics: Denies new lymphadenopathy or easy bruising Neurological:Denies numbness, tingling or new weaknesses Behavioral/Psych: Mood is stable, no new changes  All other systems were reviewed with the patient and are negative.  PHYSICAL EXAMINATION: ECOG PERFORMANCE STATUS: 1 - Symptomatic but completely ambulatory  Filed Vitals:   04/06/13  0808  BP: 141/84  Pulse: 103  Temp: 98.6 F (37 C)  Resp: 18   Filed Weights   04/06/13 0808  Weight: 263 lb 4.8 oz (119.432 kg)    GENERAL:alert, no distress and comfortable SKIN: skin color, texture, turgor are normal, no rashes or significant lesions EYES: normal, Conjunctiva are pink and non-injected, sclera clear OROPHARYNX:no exudate, no erythema and lips, buccal mucosa, and tongue normal  NECK: supple, thyroid normal size, non-tender, without nodularity LYMPH:  no palpable lymphadenopathy in the cervical, axillary or inguinal LUNGS: clear to auscultation and percussion with normal breathing effort HEART: regular rate & rhythm and no murmurs and no lower extremity edema ABDOMEN:abdomen soft, non-tender and normal bowel sounds Musculoskeletal:no cyanosis of digits and no  clubbing  NEURO: alert & oriented x 3 with fluent speech, no focal motor/sensory deficits Right breast: No palpable masses no nipple discharge no skin changes Port-A-Cath: No evidence of infections.  LABORATORY DATA:  I have reviewed the data as listed    Component Value Date/Time   NA 145 04/06/2013 0850   NA 137 03/19/2013 0247   K 4.1 04/06/2013 0850   K 3.8 03/19/2013 0247   CL 92* 03/19/2013 0247   CO2 21* 04/06/2013 0850   CO2 30 03/19/2013 0247   GLUCOSE 255* 04/06/2013 0850   GLUCOSE 463* 03/19/2013 0247   BUN 20.0 04/06/2013 0850   BUN 20 03/19/2013 0247   CREATININE 1.2* 04/06/2013 0850   CREATININE 0.92 03/19/2013 0247   CREATININE 0.96 10/06/2012 0830   CALCIUM 8.3* 04/06/2013 0850   CALCIUM 8.9 03/19/2013 0247   PROT 7.3 04/06/2013 0850   PROT 7.2 03/19/2013 0247   ALBUMIN 3.8 04/06/2013 0850   ALBUMIN 4.0 03/19/2013 0247   AST 17 04/06/2013 0850   AST 21 03/19/2013 0247   ALT 13 04/06/2013 0850   ALT 24 03/19/2013 0247   ALKPHOS 107 04/06/2013 0850   ALKPHOS 112 03/19/2013 0247   BILITOT 0.30 04/06/2013 0850   BILITOT 0.8 03/19/2013 0247   GFRNONAA 67* 03/19/2013 0247   GFRAA 78* 03/19/2013 0247    No results found  for this basename: SPEP, UPEP,  kappa and lambda light chains    Lab Results  Component Value Date   WBC 8.1 04/06/2013   NEUTROABS 6.9* 04/06/2013   HGB 10.3* 04/06/2013   HCT 31.2* 04/06/2013   MCV 84.6 04/06/2013   PLT 212 04/06/2013      Chemistry      Component Value Date/Time   NA 145 04/06/2013 0850   NA 137 03/19/2013 0247   K 4.1 04/06/2013 0850   K 3.8 03/19/2013 0247   CL 92* 03/19/2013 0247   CO2 21* 04/06/2013 0850   CO2 30 03/19/2013 0247   BUN 20.0 04/06/2013 0850   BUN 20 03/19/2013 0247   CREATININE 1.2* 04/06/2013 0850   CREATININE 0.92 03/19/2013 0247   CREATININE 0.96 10/06/2012 0830      Component Value Date/Time   CALCIUM 8.3* 04/06/2013 0850   CALCIUM 8.9 03/19/2013 0247   ALKPHOS 107 04/06/2013 0850   ALKPHOS 112 03/19/2013 0247   AST 17 04/06/2013 0850   AST 21 03/19/2013 0247   ALT 13 04/06/2013 0850   ALT 24 03/19/2013 0247   BILITOT 0.30 04/06/2013 0850   BILITOT 0.8 03/19/2013 0247       RADIOGRAPHIC STUDIES: I have personally reviewed the radiological images as listed and agreed with the findings in the report. No results found.    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 Patient is receiving neoadjuvant chemotherapy consisting of Taxotere carboplatinum Herceptin/perjeta. Overall she has tolerated it well except for significant diarrhea and some pain associated with the Neulasta injections that she receives. She however does recover quite nicely from all of these symptoms prior to a new cycle. Today I have reviewed her blood counts as well as her liver function studies. Everything looks pretty stable and she will proceed with cycle 3 day 1 of TCH/P. she will return tomorrow for Neulasta injection. She will receive IV fluids 3 consecutive days starting on 04/07/2013. She will have a followup in one week.  #  2 diabetes: This has been an issue. She  has had uncontrolled hyperglycemia secondary to her dexamethasone that she takes for chemotherapy. She is now on a sliding scale. Today her glucose results are pending.  #3 anemia: Secondary to underlying chemotherapy. Platelets are normal.  #4 degenerative joint disease: This does flare up secondary to the Neulasta injection. Symptomatic management is recommended.  #5 mild renal insufficiency: Creatinine today is 1.2 slightly up from 03/19/2013. This may be dehydration. She is encouraged to increase her fluid intake and she will get extra fluids as well in the next few days  #6 followup: Patient will be seen back for Neulasta injection tomorrow with IV fluids for the next 3 consecutive days. She will have a office visit in 1 week with blood work as well as chemotherapy toxicity assessment   No orders of the defined types were placed in this encounter.   All questions were answered. The patient knows to call the clinic with any problems, questions or concerns. No barriers to learning was detected. I spent 20 minutes counseling the patient face to face. The total time spent in the appointment was 30 minutes and more than 50% was on counseling and review of test results     Marcy Panning, MD 04/06/2013 12:24 PM

## 2013-04-07 ENCOUNTER — Ambulatory Visit (HOSPITAL_BASED_OUTPATIENT_CLINIC_OR_DEPARTMENT_OTHER): Payer: Medicare Other

## 2013-04-07 ENCOUNTER — Ambulatory Visit: Payer: Medicare Other

## 2013-04-07 VITALS — BP 109/70 | HR 83 | Temp 98.8°F | Resp 20

## 2013-04-07 DIAGNOSIS — R944 Abnormal results of kidney function studies: Secondary | ICD-10-CM

## 2013-04-07 DIAGNOSIS — Z5189 Encounter for other specified aftercare: Secondary | ICD-10-CM

## 2013-04-07 DIAGNOSIS — C50419 Malignant neoplasm of upper-outer quadrant of unspecified female breast: Secondary | ICD-10-CM

## 2013-04-07 DIAGNOSIS — E860 Dehydration: Secondary | ICD-10-CM

## 2013-04-07 DIAGNOSIS — C50411 Malignant neoplasm of upper-outer quadrant of right female breast: Secondary | ICD-10-CM

## 2013-04-07 MED ORDER — HEPARIN SOD (PORK) LOCK FLUSH 100 UNIT/ML IV SOLN
500.0000 [IU] | Freq: Once | INTRAVENOUS | Status: AC
Start: 2013-04-07 — End: 2013-04-07
  Administered 2013-04-07: 500 [IU]
  Filled 2013-04-07: qty 5

## 2013-04-07 MED ORDER — PEGFILGRASTIM INJECTION 6 MG/0.6ML
6.0000 mg | Freq: Once | SUBCUTANEOUS | Status: AC
  Administered 2013-04-07: 6 mg via SUBCUTANEOUS
  Filled 2013-04-07: qty 0.6

## 2013-04-07 MED ORDER — SODIUM CHLORIDE 0.9 % IV SOLN
Freq: Once | INTRAVENOUS | Status: AC
  Administered 2013-04-07: 09:00:00 via INTRAVENOUS

## 2013-04-07 MED ORDER — SODIUM CHLORIDE 0.9 % IJ SOLN
10.0000 mL | Freq: Once | INTRAMUSCULAR | Status: AC
  Administered 2013-04-07: 10 mL
  Filled 2013-04-07: qty 10

## 2013-04-07 NOTE — Patient Instructions (Addendum)
Dehydration, Adult Dehydration is when you lose more fluids from the body than you take in. Vital organs like the kidneys, brain, and heart cannot function without a proper amount of fluids and salt. Any loss of fluids from the body can cause dehydration.  CAUSES   Vomiting.  Diarrhea.  Excessive sweating.  Excessive urine output.  Fever. SYMPTOMS  Mild dehydration  Thirst.  Dry lips.  Slightly dry mouth. Moderate dehydration  Very dry mouth.  Sunken eyes.  Skin does not bounce back quickly when lightly pinched and released.  Dark urine and decreased urine production.  Decreased tear production.  Headache. Severe dehydration  Very dry mouth.  Extreme thirst.  Rapid, weak pulse (more than 100 beats per minute at rest).  Cold hands and feet.  Not able to sweat in spite of heat and temperature.  Rapid breathing.  Blue lips.  Confusion and lethargy.  Difficulty being awakened.  Minimal urine production.  No tears. DIAGNOSIS  Your caregiver will diagnose dehydration based on your symptoms and your exam. Blood and urine tests will help confirm the diagnosis. The diagnostic evaluation should also identify the cause of dehydration. TREATMENT  Treatment of mild or moderate dehydration can often be done at home by increasing the amount of fluids that you drink. It is best to drink small amounts of fluid more often. Drinking too much at one time can make vomiting worse. Refer to the home care instructions below. Severe dehydration needs to be treated at the hospital where you will probably be given intravenous (IV) fluids that contain water and electrolytes. HOME CARE INSTRUCTIONS   Ask your caregiver about specific rehydration instructions.  Drink enough fluids to keep your urine clear or pale yellow.  Drink small amounts frequently if you have nausea and vomiting.  Eat as you normally do.  Avoid:  Foods or drinks high in sugar.  Carbonated  drinks.  Juice.  Extremely hot or cold fluids.  Drinks with caffeine.  Fatty, greasy foods.  Alcohol.  Tobacco.  Overeating.  Gelatin desserts.  Wash your hands well to avoid spreading bacteria and viruses.  Only take over-the-counter or prescription medicines for pain, discomfort, or fever as directed by your caregiver.  Ask your caregiver if you should continue all prescribed and over-the-counter medicines.  Keep all follow-up appointments with your caregiver. SEEK MEDICAL CARE IF:  You have abdominal pain and it increases or stays in one area (localizes).  You have a rash, stiff neck, or severe headache.  You are irritable, sleepy, or difficult to awaken.  You are weak, dizzy, or extremely thirsty. SEEK IMMEDIATE MEDICAL CARE IF:   You are unable to keep fluids down or you get worse despite treatment.  You have frequent episodes of vomiting or diarrhea.  You have blood or green matter (bile) in your vomit.  You have blood in your stool or your stool looks black and tarry.  You have not urinated in 6 to 8 hours, or you have only urinated a small amount of very dark urine.  You have a fever.  You faint. MAKE SURE YOU:   Understand these instructions.  Will watch your condition.  Will get help right away if you are not doing well or get worse. Document Released: 12/30/2004 Document Revised: 03/24/2011 Document Reviewed: 08/19/2010 [employer] Patient Information 2014 [employer].  Pegfilgrastim injection What is this medicine? PEGFILGRASTIM (peg fil GRA stim) helps the body make more white blood cells. It is used to prevent infection in people   with low amounts of white blood cells following cancer treatment. This medicine may be used for other purposes; ask your health care provider or pharmacist if you have questions. COMMON BRAND NAME(S): Neulasta What should I tell my health care provider before I take this medicine? They need to know if you have  any of these conditions: -sickle cell disease -an unusual or allergic reaction to pegfilgrastim, filgrastim, E.coli protein, other medicines, foods, dyes, or preservatives -pregnant or trying to get pregnant -breast-feeding How should I use this medicine? This medicine is for injection under the skin. It is usually given by a health care professional in a hospital or clinic setting. If you get this medicine at home, you will be taught how to prepare and give this medicine. Do not shake this medicine. Use exactly as directed. Take your medicine at regular intervals. Do not take your medicine more often than directed. It is important that you put your used needles and syringes in a special sharps container. Do not put them in a trash can. If you do not have a sharps container, call your pharmacist or healthcare provider to get one. Talk to your pediatrician regarding the use of this medicine in children. While this drug may be prescribed for children who weigh more than 45 kg for selected conditions, precautions do apply Overdosage: If you think you have taken too much of this medicine contact a poison control center or emergency room at once. NOTE: This medicine is only for you. Do not share this medicine with others. What if I miss a dose? If you miss a dose, take it as soon as you can. If it is almost time for your next dose, take only that dose. Do not take double or extra doses. What may interact with this medicine? -lithium -medicines for growth therapy This list may not describe all possible interactions. Give your health care provider a list of all the medicines, herbs, non-prescription drugs, or dietary supplements you use. Also tell them if you smoke, drink alcohol, or use illegal drugs. Some items may interact with your medicine. What should I watch for while using this medicine? Visit your doctor for regular check ups. You will need important blood work done while you are taking this  medicine. What side effects may I notice from receiving this medicine? Side effects that you should report to your doctor or health care professional as soon as possible: -allergic reactions like skin rash, itching or hives, swelling of the face, lips, or tongue -breathing problems -fever -pain, redness, or swelling where injected -shoulder pain -stomach or side pain Side effects that usually do not require medical attention (report to your doctor or health care professional if they continue or are bothersome): -aches, pains -headache -loss of appetite -nausea, vomiting -unusually tired This list may not describe all possible side effects. Call your doctor for medical advice about side effects. You may report side effects to FDA at 1-800-FDA-1088. Where should I keep my medicine? Keep out of the reach of children. Store in a refrigerator between 2 and 8 degrees C (36 and 46 degrees F). Do not freeze. Keep in carton to protect from light. Throw away this medicine if it is left out of the refrigerator for more than 48 hours. Throw away any unused medicine after the expiration date. NOTE: This sheet is a summary. It may not cover all possible information. If you have questions about this medicine, talk to your doctor, pharmacist, or health care provider.    2014, [employer] Standard. (2007-08-02 15:41:44)   

## 2013-04-08 ENCOUNTER — Ambulatory Visit (HOSPITAL_BASED_OUTPATIENT_CLINIC_OR_DEPARTMENT_OTHER): Payer: Medicare Other

## 2013-04-08 VITALS — BP 135/63 | HR 88 | Temp 98.1°F | Resp 20

## 2013-04-08 DIAGNOSIS — E860 Dehydration: Secondary | ICD-10-CM

## 2013-04-08 DIAGNOSIS — C50419 Malignant neoplasm of upper-outer quadrant of unspecified female breast: Secondary | ICD-10-CM

## 2013-04-08 MED ORDER — SODIUM CHLORIDE 0.9 % IJ SOLN
10.0000 mL | INTRAMUSCULAR | Status: DC | PRN
  Administered 2013-04-08: 10 mL via INTRAVENOUS
  Filled 2013-04-08: qty 10

## 2013-04-08 MED ORDER — HEPARIN SOD (PORK) LOCK FLUSH 100 UNIT/ML IV SOLN
500.0000 [IU] | Freq: Once | INTRAVENOUS | Status: AC
  Administered 2013-04-08: 500 [IU] via INTRAVENOUS
  Filled 2013-04-08: qty 5

## 2013-04-08 MED ORDER — SODIUM CHLORIDE 0.9 % IV SOLN
1000.0000 mL | INTRAVENOUS | Status: DC
  Administered 2013-04-08: 500 mL via INTRAVENOUS

## 2013-04-08 NOTE — Patient Instructions (Signed)
[facility] Discharge Instructions for Patients Receiving Chemotherapy  To help prevent nausea and vomiting after your treatment, we encourage you to take your nausea medications as ordered:  Compazine 10 mg every 6 hours as needed  Zofran 4 mg every 8 hours as needed If you develop nausea and vomiting that is not controlled by your nausea medication, call the clinic. Tomorrow you will come back to infusion area without registering (Saturday procedure)  BELOW ARE SYMPTOMS THAT SHOULD BE REPORTED IMMEDIATELY:  *FEVER GREATER THAN 100.5 F  *CHILLS WITH OR WITHOUT FEVER  NAUSEA AND VOMITING THAT IS NOT CONTROLLED WITH YOUR NAUSEA MEDICATION  *UNUSUAL SHORTNESS OF BREATH  *UNUSUAL BRUISING OR BLEEDING  TENDERNESS IN MOUTH AND THROAT WITH OR WITHOUT PRESENCE OF ULCERS  *URINARY PROBLEMS  *BOWEL PROBLEMS  UNUSUAL RASH Items with * indicate a potential emergency and should be followed up as soon as possible.  Feel free to call the clinic should you have any questions or concerns. The clinic phone number is [phone].   Dehydration, Adult Dehydration is when you lose more fluids from the body than you take in. Vital organs like the kidneys, brain, and heart cannot function without a proper amount of fluids and salt. Any loss of fluids from the body can cause dehydration.  CAUSES   Vomiting.  Diarrhea.  Excessive sweating.  Excessive urine output.  Fever. SYMPTOMS  Mild dehydration  Thirst.  Dry lips.  Slightly dry mouth. Moderate dehydration  Very dry mouth.  Sunken eyes.  Skin does not bounce back quickly when lightly pinched and released.  Dark urine and decreased urine production.  Decreased tear production.  Headache. Severe dehydration  Very dry mouth.  Extreme thirst.  Rapid, weak pulse (more than 100 beats per minute at rest).  Cold hands and feet.  Not able to sweat in spite of heat and temperature.  Rapid  breathing.  Blue lips.  Confusion and lethargy.  Difficulty being awakened.  Minimal urine production.  No tears. DIAGNOSIS  Your caregiver will diagnose dehydration based on your symptoms and your exam. Blood and urine tests will help confirm the diagnosis. The diagnostic evaluation should also identify the cause of dehydration. TREATMENT  Treatment of mild or moderate dehydration can often be done at home by increasing the amount of fluids that you drink. It is best to drink small amounts of fluid more often. Drinking too much at one time can make vomiting worse. Refer to the home care instructions below. Severe dehydration needs to be treated at the hospital where you will probably be given intravenous (IV) fluids that contain water and electrolytes. HOME CARE INSTRUCTIONS   Ask your caregiver about specific rehydration instructions.  Drink enough fluids to keep your urine clear or pale yellow.  Drink small amounts frequently if you have nausea and vomiting.  Eat as you normally do.  Avoid:  Foods or drinks high in sugar.  Carbonated drinks.  Juice.  Extremely hot or cold fluids.  Drinks with caffeine.  Fatty, greasy foods.  Alcohol.  Tobacco.  Overeating.  Gelatin desserts.  Wash your hands well to avoid spreading bacteria and viruses.  Only take over-the-counter or prescription medicines for pain, discomfort, or fever as directed by your caregiver.  Ask your caregiver if you should continue all prescribed and over-the-counter medicines.  Keep all follow-up appointments with your caregiver. SEEK MEDICAL CARE IF:  You have abdominal pain and it increases or stays in one area (localizes).  You  have a rash, stiff neck, or severe headache.  You are irritable, sleepy, or difficult to awaken.  You are weak, dizzy, or extremely thirsty. SEEK IMMEDIATE MEDICAL CARE IF:   You are unable to keep fluids down or you get worse despite treatment.  You have  frequent episodes of vomiting or diarrhea.  You have blood or green matter (bile) in your vomit.  You have blood in your stool or your stool looks black and tarry.  You have not urinated in 6 to 8 hours, or you have only urinated a small amount of very dark urine.  You have a fever.  You faint. MAKE SURE YOU:   Understand these instructions.  Will watch your condition.  Will get help right away if you are not doing well or get worse. Document Released: 12/30/2004 Document Revised: 03/24/2011 Document Reviewed: 08/19/2010 [facility] Patient Information 2014 [place], [place].  It has been a pleasure to serve you today!

## 2013-04-09 ENCOUNTER — Ambulatory Visit (HOSPITAL_BASED_OUTPATIENT_CLINIC_OR_DEPARTMENT_OTHER): Payer: Medicare Other

## 2013-04-09 VITALS — BP 130/72 | HR 80 | Temp 98.8°F | Resp 19

## 2013-04-09 DIAGNOSIS — C50419 Malignant neoplasm of upper-outer quadrant of unspecified female breast: Secondary | ICD-10-CM

## 2013-04-09 DIAGNOSIS — E860 Dehydration: Secondary | ICD-10-CM

## 2013-04-09 MED ORDER — SODIUM CHLORIDE 0.9 % IV SOLN
INTRAVENOUS | Status: DC
  Administered 2013-04-09: 08:00:00 via INTRAVENOUS

## 2013-04-09 MED ORDER — SODIUM CHLORIDE 0.9 % IJ SOLN
10.0000 mL | INTRAMUSCULAR | Status: DC | PRN
  Administered 2013-04-09: 10 mL via INTRAVENOUS
  Filled 2013-04-09: qty 10

## 2013-04-09 MED ORDER — ALTEPLASE 2 MG IJ SOLR
2.0000 mg | Freq: Once | INTRAMUSCULAR | Status: DC | PRN
  Filled 2013-04-09: qty 2

## 2013-04-09 MED ORDER — HEPARIN SOD (PORK) LOCK FLUSH 100 UNIT/ML IV SOLN
500.0000 [IU] | Freq: Once | INTRAVENOUS | Status: AC
  Administered 2013-04-09: 500 [IU] via INTRAVENOUS
  Filled 2013-04-09: qty 5

## 2013-04-09 NOTE — Patient Instructions (Signed)
Dehydration, Adult Dehydration is when you lose more fluids from the body than you take in. Vital organs like the kidneys, brain, and heart cannot function without a proper amount of fluids and salt. Any loss of fluids from the body can cause dehydration.  CAUSES   Vomiting.  Diarrhea.  Excessive sweating.  Excessive urine output.  Fever. SYMPTOMS  Mild dehydration  Thirst.  Dry lips.  Slightly dry mouth. Moderate dehydration  Very dry mouth.  Sunken eyes.  Skin does not bounce back quickly when lightly pinched and released.  Dark urine and decreased urine production.  Decreased tear production.  Headache. Severe dehydration  Very dry mouth.  Extreme thirst.  Rapid, weak pulse (more than 100 beats per minute at rest).  Cold hands and feet.  Not able to sweat in spite of heat and temperature.  Rapid breathing.  Blue lips.  Confusion and lethargy.  Difficulty being awakened.  Minimal urine production.  No tears. DIAGNOSIS  Your caregiver will diagnose dehydration based on your symptoms and your exam. Blood and urine tests will help confirm the diagnosis. The diagnostic evaluation should also identify the cause of dehydration. TREATMENT  Treatment of mild or moderate dehydration can often be done at home by increasing the amount of fluids that you drink. It is best to drink small amounts of fluid more often. Drinking too much at one time can make vomiting worse. Refer to the home care instructions below. Severe dehydration needs to be treated at the hospital where you will probably be given intravenous (IV) fluids that contain water and electrolytes. HOME CARE INSTRUCTIONS   Ask your caregiver about specific rehydration instructions.  Drink enough fluids to keep your urine clear or pale yellow.  Drink small amounts frequently if you have nausea and vomiting.  Eat as you normally do.  Avoid:  Foods or drinks high in sugar.  Carbonated  drinks.  Juice.  Extremely hot or cold fluids.  Drinks with caffeine.  Fatty, greasy foods.  Alcohol.  Tobacco.  Overeating.  Gelatin desserts.  Wash your hands well to avoid spreading bacteria and viruses.  Only take over-the-counter or prescription medicines for pain, discomfort, or fever as directed by your caregiver.  Ask your caregiver if you should continue all prescribed and over-the-counter medicines.  Keep all follow-up appointments with your caregiver. SEEK MEDICAL CARE IF:  You have abdominal pain and it increases or stays in one area (localizes).  You have a rash, stiff neck, or severe headache.  You are irritable, sleepy, or difficult to awaken.  You are weak, dizzy, or extremely thirsty. SEEK IMMEDIATE MEDICAL CARE IF:   You are unable to keep fluids down or you get worse despite treatment.  You have frequent episodes of vomiting or diarrhea.  You have blood or green matter (bile) in your vomit.  You have blood in your stool or your stool looks black and tarry.  You have not urinated in 6 to 8 hours, or you have only urinated a small amount of very dark urine.  You have a fever.  You faint. MAKE SURE YOU:   Understand these instructions.  Will watch your condition.  Will get help right away if you are not doing well or get worse. Document Released: 12/30/2004 Document Revised: 03/24/2011 Document Reviewed: 08/19/2010 [employer] Patient Information 2014 [employer]  

## 2013-04-13 ENCOUNTER — Ambulatory Visit (HOSPITAL_BASED_OUTPATIENT_CLINIC_OR_DEPARTMENT_OTHER): Payer: Medicare Other

## 2013-04-13 ENCOUNTER — Encounter: Payer: Self-pay | Admitting: Oncology

## 2013-04-13 ENCOUNTER — Other Ambulatory Visit: Payer: Self-pay | Admitting: Family Medicine

## 2013-04-13 ENCOUNTER — Other Ambulatory Visit (HOSPITAL_BASED_OUTPATIENT_CLINIC_OR_DEPARTMENT_OTHER): Payer: Medicare Other

## 2013-04-13 ENCOUNTER — Ambulatory Visit (HOSPITAL_BASED_OUTPATIENT_CLINIC_OR_DEPARTMENT_OTHER): Payer: Medicare Other | Admitting: Oncology

## 2013-04-13 VITALS — BP 131/77 | HR 106 | Temp 97.7°F | Resp 18 | Ht 64.5 in | Wt 249.0 lb

## 2013-04-13 DIAGNOSIS — M1990 Unspecified osteoarthritis, unspecified site: Secondary | ICD-10-CM

## 2013-04-13 DIAGNOSIS — C50411 Malignant neoplasm of upper-outer quadrant of right female breast: Secondary | ICD-10-CM

## 2013-04-13 DIAGNOSIS — Z170 Estrogen receptor positive status [ER+]: Secondary | ICD-10-CM

## 2013-04-13 DIAGNOSIS — T451X5A Adverse effect of antineoplastic and immunosuppressive drugs, initial encounter: Secondary | ICD-10-CM

## 2013-04-13 DIAGNOSIS — C50419 Malignant neoplasm of upper-outer quadrant of unspecified female breast: Secondary | ICD-10-CM

## 2013-04-13 DIAGNOSIS — E119 Type 2 diabetes mellitus without complications: Secondary | ICD-10-CM

## 2013-04-13 DIAGNOSIS — D6481 Anemia due to antineoplastic chemotherapy: Secondary | ICD-10-CM

## 2013-04-13 DIAGNOSIS — E860 Dehydration: Secondary | ICD-10-CM

## 2013-04-13 DIAGNOSIS — N289 Disorder of kidney and ureter, unspecified: Secondary | ICD-10-CM

## 2013-04-13 HISTORY — DX: Dehydration: E86.0

## 2013-04-13 LAB — COMPREHENSIVE METABOLIC PANEL (CC13)
ALBUMIN: 4 g/dL (ref 3.5–5.0)
ALT: 34 U/L (ref 0–55)
ANION GAP: 16 meq/L — AB (ref 3–11)
AST: 29 U/L (ref 5–34)
Alkaline Phosphatase: 128 U/L (ref 40–150)
BUN: 19.4 mg/dL (ref 7.0–26.0)
CO2: 21 mEq/L — ABNORMAL LOW (ref 22–29)
Calcium: 8.7 mg/dL (ref 8.4–10.4)
Chloride: 99 mEq/L (ref 98–109)
Creatinine: 1.5 mg/dL — ABNORMAL HIGH (ref 0.6–1.1)
GLUCOSE: 280 mg/dL — AB (ref 70–140)
POTASSIUM: 3.6 meq/L (ref 3.5–5.1)
Sodium: 137 mEq/L (ref 136–145)
TOTAL PROTEIN: 7.1 g/dL (ref 6.4–8.3)
Total Bilirubin: 0.53 mg/dL (ref 0.20–1.20)

## 2013-04-13 LAB — LIPID PANEL
CHOLESTEROL: 121 mg/dL (ref 0–200)
HDL: 45 mg/dL (ref >39)
LDL Cholesterol: 32 mg/dL (ref 0–99)
TRIGLYCERIDES: 219 mg/dL — AB (ref <150)
Total CHOL/HDL Ratio: 2.7 Ratio
VLDL: 44 mg/dL — ABNORMAL HIGH (ref 0–40)

## 2013-04-13 LAB — CBC WITH DIFFERENTIAL/PLATELET
BASO%: 0.2 % (ref 0.0–2.0)
Basophils Absolute: 0 10*3/uL (ref 0.0–0.1)
EOS ABS: 0 10*3/uL (ref 0.0–0.5)
EOS%: 0.4 % (ref 0.0–7.0)
HCT: 32.8 % — ABNORMAL LOW (ref 34.8–46.6)
HEMOGLOBIN: 11.1 g/dL — AB (ref 11.6–15.9)
LYMPH%: 21.9 % (ref 14.0–49.7)
MCH: 27.7 pg (ref 25.1–34.0)
MCHC: 33.8 g/dL (ref 31.5–36.0)
MCV: 81.8 fL (ref 79.5–101.0)
MONO#: 1.8 10*3/uL — AB (ref 0.1–0.9)
MONO%: 18.6 % — ABNORMAL HIGH (ref 0.0–14.0)
NEUT%: 58.9 % (ref 38.4–76.8)
NEUTROS ABS: 5.6 10*3/uL (ref 1.5–6.5)
PLATELETS: 179 10*3/uL (ref 145–400)
RBC: 4.01 10*6/uL (ref 3.70–5.45)
RDW: 19 % — AB (ref 11.2–14.5)
WBC: 9.6 10*3/uL (ref 3.9–10.3)
lymph#: 2.1 10*3/uL (ref 0.9–3.3)

## 2013-04-13 LAB — HEPATIC FUNCTION PANEL
ALK PHOS: 105 U/L (ref 39–117)
ALT: 30 U/L (ref 0–35)
AST: 26 U/L (ref 0–37)
Albumin: 4.1 g/dL (ref 3.5–5.2)
Bilirubin, Direct: 0.2 mg/dL (ref 0.0–0.3)
Indirect Bilirubin: 0.3 mg/dL (ref 0.2–1.2)
TOTAL PROTEIN: 6.6 g/dL (ref 6.0–8.3)
Total Bilirubin: 0.5 mg/dL (ref 0.2–1.2)

## 2013-04-13 MED ORDER — GABAPENTIN 100 MG PO CAPS: 100.0000 mg | ORAL_CAPSULE | Freq: Three times a day (TID) | ORAL | Status: DC

## 2013-04-13 MED ORDER — HEPARIN SOD (PORK) LOCK FLUSH 100 UNIT/ML IV SOLN
500.0000 [IU] | Freq: Once | INTRAVENOUS | Status: AC
  Administered 2013-04-13: 500 [IU] via INTRAVENOUS
  Filled 2013-04-13: qty 5

## 2013-04-13 MED ORDER — SODIUM CHLORIDE 0.9 % IV SOLN
Freq: Once | INTRAVENOUS | Status: AC
  Administered 2013-04-13: 10:00:00 via INTRAVENOUS

## 2013-04-13 MED ORDER — SODIUM CHLORIDE 0.9 % IJ SOLN
10.0000 mL | INTRAMUSCULAR | Status: DC | PRN
  Administered 2013-04-13: 10 mL via INTRAVENOUS
  Filled 2013-04-13: qty 10

## 2013-04-13 NOTE — Patient Instructions (Addendum)
Dehydration: IVF today and on 4/2  Diarrhea: continue immodium   Neuropathy: begin taking neurontin 100 mg three times a day, prescription sent to pharmacy, super B complex daily (find it over the counter)  Follow up: in 2 weeks for cycle 4 of TCH/P

## 2013-04-13 NOTE — Patient Instructions (Addendum)
Dehydration, Adult Dehydration means your body does not have as much fluid as it needs. Your kidneys, brain, and heart will not work properly without the right amount of fluids and salt.  HOME CARE  Ask your doctor how to replace body fluid losses (rehydrate).  Drink enough fluids to keep your pee (urine) clear or pale yellow.  Drink small amounts of fluids often if you feel sick to your stomach (nauseous) or throw up (vomit).  Eat like you normally do.  Avoid:  Foods or drinks high in sugar.  Bubbly (carbonated) drinks.  Juice.  Very hot or cold fluids.  Drinks with caffeine.  Fatty, greasy foods.  Alcohol.  Tobacco.  Eating too much.  Gelatin desserts.  Wash your hands to avoid spreading germs (bacteria, viruses).  Only take medicine as told by your doctor.  Keep all doctor visits as told. GET HELP RIGHT AWAY IF:   You cannot drink something without throwing up.  You get worse even with treatment.  Your vomit has blood in it or looks greenish.  Your poop (stool) has blood in it or looks black and tarry.  You have not peed in 6 to 8 hours.  You pee a small amount of very dark pee.  You have a fever.  You pass out (faint).  You have belly (abdominal) pain that gets worse or stays in one spot (localizes).  You have a rash, stiff neck, or bad headache.  You get easily annoyed, sleepy, or are hard to wake up.  You feel weak, dizzy, or very thirsty. MAKE SURE YOU:   Understand these instructions.  Will watch your condition.  Will get help right away if you are not doing well or get worse. Document Released: 10/26/2008 Document Revised: 03/24/2011 Document Reviewed: 08/19/2010 [facility] Patient Information 2014 [place], [place]. a

## 2013-04-14 ENCOUNTER — Ambulatory Visit (HOSPITAL_BASED_OUTPATIENT_CLINIC_OR_DEPARTMENT_OTHER): Payer: Medicare Other

## 2013-04-14 ENCOUNTER — Other Ambulatory Visit: Payer: Self-pay | Admitting: Family Medicine

## 2013-04-14 ENCOUNTER — Other Ambulatory Visit: Payer: Self-pay | Admitting: *Deleted

## 2013-04-14 VITALS — BP 109/66 | HR 86 | Temp 97.7°F

## 2013-04-14 DIAGNOSIS — E860 Dehydration: Secondary | ICD-10-CM

## 2013-04-14 DIAGNOSIS — C50419 Malignant neoplasm of upper-outer quadrant of unspecified female breast: Secondary | ICD-10-CM

## 2013-04-14 DIAGNOSIS — N289 Disorder of kidney and ureter, unspecified: Secondary | ICD-10-CM

## 2013-04-14 MED ORDER — HEPARIN SOD (PORK) LOCK FLUSH 100 UNIT/ML IV SOLN
500.0000 [IU] | Freq: Once | INTRAVENOUS | Status: AC
  Administered 2013-04-14: 500 [IU] via INTRAVENOUS
  Filled 2013-04-14: qty 5

## 2013-04-14 MED ORDER — SODIUM CHLORIDE 0.9 % IV SOLN
Freq: Once | INTRAVENOUS | Status: AC
  Administered 2013-04-14: 13:00:00 via INTRAVENOUS

## 2013-04-14 MED ORDER — SODIUM CHLORIDE 0.9 % IJ SOLN
10.0000 mL | INTRAMUSCULAR | Status: DC | PRN
  Administered 2013-04-14: 10 mL via INTRAVENOUS
  Filled 2013-04-14: qty 10

## 2013-04-14 NOTE — Patient Instructions (Signed)

## 2013-04-15 ENCOUNTER — Telehealth: Payer: Self-pay | Admitting: *Deleted

## 2013-04-15 NOTE — Telephone Encounter (Signed)
Mailed after appt letter to pt. 

## 2013-04-15 NOTE — Progress Notes (Signed)
[facility] OFFICE PROGRESS NOTE  Patient Care Team: Mikey Kirschner, MD as PCP - General (Family Medicine) Gearlean Alf, MD (Orthopedic Surgery) Dr. Thea Silversmith  Dr. Erroll Luna  DIAGNOSIS: 58 year old female with new diagnosis of T1 N1 (stage II) right breast cancer.  STAGE:  Breast cancer of upper-outer quadrant of right female breast  Primary site: Breast (Right)  Staging method: AJCC 7th Edition  Clinical: Stage IIA (T1c, N1, cM0)  Summary: Stage IIA (T1c, N1, cM0)  SUMMARY OF ONCOLOGIC HISTORY: #1 Patient palpated a right breast mass. She had a mammogram that revealed a 1.8 cm lesion in the right breast. There was also noted to be enlarged lymph node by ultrasound. Patient could not tolerate MRI. 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 The biopsied right lymph node was positive for metastatic disease.  #2[name] on neoadjuvant chemotherapy consisting of Taxotere carboplatinum Herceptin/Perjeta beginning 02/22/2013. Total of 6 cycles of therapy is planned.   CURRENT THERAPY: Neoadjuvant cycle 3 day 8 of TCH/Perjeta  INTERVAL HISTORY: [name] 58 y.o. female returns for followup visit. She tolerated the chemotherapy reasonably well. She however did develop diarrhea as well as changes in her taste buds. She has been taking Imodium for the diarrhea. She's had some nausea but has been able to take care of her with her antiemetics. She does develop again aches and pains from the Neulasta. She is recommended to take some Claritin as well as Tylenol as needed for the aches and pains. She's not having any peripheral paresthesias. Remainder of the review of systems as below  I have reviewed the past medical history, past surgical history, social history and family history with the patient and they are unchanged from previous note.  ALLERGIES:  is allergic to procardia.  MEDICATIONS:   Current Outpatient Prescriptions  Medication Sig Dispense Refill  . ACCU-CHEK SMARTVIEW test strip USE ONE STRIP TO CHECK GLUCOSE 4 TIMES DAILY  100 each  5  . albuterol (PROVENTIL HFA;VENTOLIN HFA) 108 (90 BASE) MCG/ACT inhaler Inhale 2 puffs into the lungs every 4 (four) hours as needed.  18 g  2  . albuterol (PROVENTIL) (5 MG/ML) 0.5% nebulizer solution Take 2.5 mg by nebulization every 6 (six) hours as needed for wheezing or shortness of breath.      Marland Kitchen amLODipine (NORVASC) 10 MG tablet TAKE ONE TABLET BY MOUTH ONCE DAILY.  90 tablet  0  . Artificial Tear Ointment (ARTIFICIAL TEARS) ointment Place 1 drop into both eyes as needed.       Marland Kitchen aspirin 81 MG tablet Take 81 mg by mouth daily.      . cycloSPORINE (RESTASIS) 0.05 % ophthalmic emulsion Place 1 drop into both eyes 2 (two) times daily.       Marland Kitchen dexamethasone (DECADRON) 4 MG tablet       . hydrochlorothiazide (HYDRODIURIL) 25 MG tablet TAKE ONE TABLET BY MOUTH ONCE DAILY.  30 tablet  2  . labetalol (NORMODYNE) 300 MG tablet TAKE (1) TABLET BY MOUTH TWICE DAILY.  60 tablet  3  . LANTUS SOLOSTAR 100 UNIT/ML Solostar Pen Inject 5 Units into the skin. Take 5 units in am.      . levothyroxine (SYNTHROID, LEVOTHROID) 100 MCG tablet Take 100 mcg by mouth every morning.      . lidocaine-prilocaine (EMLA) cream Apply 1 application topically as needed.  30 g  0  . metFORMIN (GLUCOPHAGE) 500 MG  tablet Take 1 tablet (500 mg total) by mouth 2 (two) times daily with a meal.  60 tablet  6  . omeprazole (PRILOSEC) 20 MG capsule Take 1 capsule (20 mg total) by mouth daily.  30 capsule  1  . ondansetron (ZOFRAN ODT) 4 MG disintegrating tablet Take 1 tablet (4 mg total) by mouth every 8 (eight) hours as needed for nausea.  10 tablet  0  . pravastatin (PRAVACHOL) 80 MG tablet Take 1 tablet (80 mg total) by mouth at bedtime.  30 tablet  6  . prochlorperazine (COMPAZINE) 10 MG tablet       . benazepril (LOTENSIN) 40 MG tablet TAKE ONE TABLET BY MOUTH ONCE  DAILY.  30 tablet  5  . cloNIDine (CATAPRES - DOSED IN MG/24 HR) 0.2 mg/24hr patch APPLY 1 PATCH TRANSDERMALLY ONCE A WEEK.  4 patch  5  . gabapentin (NEURONTIN) 100 MG capsule Take 1 capsule (100 mg total) by mouth 3 (three) times daily.  90 capsule  5   No current facility-administered medications for this visit.    REVIEW OF SYSTEMS:   Constitutional: Denies fevers, chills or abnormal weight loss Eyes: Denies blurriness of vision Ears, nose, mouth, throat, and face: Denies mucositis or sore throat Respiratory: Denies cough, dyspnea or wheezes Cardiovascular: Denies palpitation, chest discomfort or lower extremity swelling Gastrointestinal:  Denies nausea, heartburn, positive for diarrhea Skin: Denies abnormal skin rashes Lymphatics: Denies new lymphadenopathy or easy bruising Neurological:Denies numbness, tingling or new weaknesses Behavioral/Psych: Mood is stable, no new changes  All other systems were reviewed with the patient and are negative.  PHYSICAL EXAMINATION: ECOG PERFORMANCE STATUS: 1 - Symptomatic but completely ambulatory  Filed Vitals:   04/13/13 0835  BP: 131/77  Pulse: 106  Temp: 97.7 F (36.5 C)  Resp: 18   Filed Weights   04/13/13 0835  Weight: 249 lb (112.946 kg)    GENERAL:alert, no distress and comfortable SKIN: skin color, texture, turgor are normal, no rashes or significant lesions EYES: normal, Conjunctiva are pink and non-injected, sclera clear OROPHARYNX:no exudate, no erythema and lips, buccal mucosa, and tongue normal  NECK: supple, thyroid normal size, non-tender, without nodularity LYMPH:  no palpable lymphadenopathy in the cervical, axillary or inguinal LUNGS: clear to auscultation and percussion with normal breathing effort HEART: regular rate & rhythm and no murmurs and no lower extremity edema ABDOMEN:abdomen soft, non-tender and normal bowel sounds Musculoskeletal:no cyanosis of digits and no clubbing  NEURO: alert & oriented x 3  with fluent speech, no focal motor/sensory deficits Right breast: No palpable masses no nipple discharge no skin changes Port-A-Cath: No evidence of infections.  LABORATORY DATA:  I have reviewed the data as listed    Component Value Date/Time   NA 137 04/13/2013 0757   NA 137 03/19/2013 0247   K 3.6 04/13/2013 0757   K 3.8 03/19/2013 0247   CL 92* 03/19/2013 0247   CO2 21* 04/13/2013 0757   CO2 30 03/19/2013 0247   GLUCOSE 280* 04/13/2013 0757   GLUCOSE 463* 03/19/2013 0247   BUN 19.4 04/13/2013 0757   BUN 20 03/19/2013 0247   CREATININE 1.5* 04/13/2013 0757   CREATININE 0.92 03/19/2013 0247   CREATININE 0.96 10/06/2012 0830   CALCIUM 8.7 04/13/2013 0757   CALCIUM 8.9 03/19/2013 0247   PROT 6.6 04/13/2013 0805   PROT 7.1 04/13/2013 0757   ALBUMIN 4.1 04/13/2013 0805   ALBUMIN 4.0 04/13/2013 0757   AST 26 04/13/2013 0805   AST 29 04/13/2013 0757  ALT 30 04/13/2013 0805   ALT 34 04/13/2013 0757   ALKPHOS 105 04/13/2013 0805   ALKPHOS 128 04/13/2013 0757   BILITOT 0.5 04/13/2013 0805   BILITOT 0.53 04/13/2013 0757   GFRNONAA 67* 03/19/2013 0247   GFRAA 78* 03/19/2013 0247    No results found for this basename: SPEP,  UPEP,   kappa and lambda light chains    Lab Results  Component Value Date   WBC 9.6 04/13/2013   NEUTROABS 5.6 04/13/2013   HGB 11.1* 04/13/2013   HCT 32.8* 04/13/2013   MCV 81.8 04/13/2013   PLT 179 04/13/2013      Chemistry      Component Value Date/Time   NA 137 04/13/2013 0757   NA 137 03/19/2013 0247   K 3.6 04/13/2013 0757   K 3.8 03/19/2013 0247   CL 92* 03/19/2013 0247   CO2 21* 04/13/2013 0757   CO2 30 03/19/2013 0247   BUN 19.4 04/13/2013 0757   BUN 20 03/19/2013 0247   CREATININE 1.5* 04/13/2013 0757   CREATININE 0.92 03/19/2013 0247   CREATININE 0.96 10/06/2012 0830      Component Value Date/Time   CALCIUM 8.7 04/13/2013 0757   CALCIUM 8.9 03/19/2013 0247   ALKPHOS 105 04/13/2013 0805   ALKPHOS 128 04/13/2013 0757   AST 26 04/13/2013 0805   AST 29 04/13/2013 0757   ALT 30 04/13/2013 0805   ALT 34 04/13/2013 0757   BILITOT  0.5 04/13/2013 0805   BILITOT 0.53 04/13/2013 0757       RADIOGRAPHIC STUDIES: I have personally reviewed the radiological images as listed and agreed with the findings in the report. No results found.    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 Patient is receiving neoadjuvant chemotherapy consisting of Taxotere carboplatinum Herceptin/perjeta. She is now status post cycle 3 day 1. Tolerated it well. She did develop diarrhea. She does take Imodium for it.  #2 diabetes: Better controlled. We will continue to monitor  #3 anemia: Secondary to underlying chemotherapy. Platelets are normal.  #4 degenerative joint disease: This does flare up secondary to the Neulasta injection. Symptomatic management is recommended.  #5 mild renal insufficiency: Creatinine today is 1.5 patient received IV fluids today as well as tomorrow. She is also encouraged to continue oral fluids at home.  #6 followup: She'll be seen back in 2 weeks' time for cycle 4 day 1 of TCH/ P.  No orders of the defined types were placed in this encounter.   All questions were answered. The patient knows to call the clinic with any problems, questions or concerns. No barriers to learning was detected. I spent 20 minutes counseling the patient face to face. The total time spent in the appointment was 30 minutes and more than 50% was on counseling and review of test results and coordination of care     Marcy Panning, MD 04/15/2013 11:10 AM

## 2013-04-22 ENCOUNTER — Other Ambulatory Visit: Payer: Self-pay | Admitting: Family Medicine

## 2013-04-27 ENCOUNTER — Ambulatory Visit (HOSPITAL_BASED_OUTPATIENT_CLINIC_OR_DEPARTMENT_OTHER): Payer: Medicare Other

## 2013-04-27 ENCOUNTER — Encounter: Payer: Self-pay | Admitting: Oncology

## 2013-04-27 ENCOUNTER — Ambulatory Visit (HOSPITAL_BASED_OUTPATIENT_CLINIC_OR_DEPARTMENT_OTHER): Payer: Medicare Other | Admitting: Oncology

## 2013-04-27 ENCOUNTER — Other Ambulatory Visit (HOSPITAL_BASED_OUTPATIENT_CLINIC_OR_DEPARTMENT_OTHER): Payer: Medicare Other

## 2013-04-27 VITALS — BP 116/74 | HR 85 | Temp 98.5°F | Resp 18 | Ht 64.0 in | Wt 258.7 lb

## 2013-04-27 DIAGNOSIS — G609 Hereditary and idiopathic neuropathy, unspecified: Secondary | ICD-10-CM

## 2013-04-27 DIAGNOSIS — R197 Diarrhea, unspecified: Secondary | ICD-10-CM

## 2013-04-27 DIAGNOSIS — C50411 Malignant neoplasm of upper-outer quadrant of right female breast: Secondary | ICD-10-CM

## 2013-04-27 DIAGNOSIS — C50419 Malignant neoplasm of upper-outer quadrant of unspecified female breast: Secondary | ICD-10-CM

## 2013-04-27 DIAGNOSIS — E119 Type 2 diabetes mellitus without complications: Secondary | ICD-10-CM

## 2013-04-27 DIAGNOSIS — Z5111 Encounter for antineoplastic chemotherapy: Secondary | ICD-10-CM

## 2013-04-27 DIAGNOSIS — E860 Dehydration: Secondary | ICD-10-CM

## 2013-04-27 LAB — CBC WITH DIFFERENTIAL/PLATELET
BASO%: 0.2 % (ref 0.0–2.0)
Basophils Absolute: 0 10*3/uL (ref 0.0–0.1)
EOS ABS: 0.1 10*3/uL (ref 0.0–0.5)
EOS%: 1.9 % (ref 0.0–7.0)
HCT: 26.1 % — ABNORMAL LOW (ref 34.8–46.6)
HGB: 8.7 g/dL — ABNORMAL LOW (ref 11.6–15.9)
LYMPH%: 20.5 % (ref 14.0–49.7)
MCH: 28.3 pg (ref 25.1–34.0)
MCHC: 33.3 g/dL (ref 31.5–36.0)
MCV: 85 fL (ref 79.5–101.0)
MONO#: 0.5 10*3/uL (ref 0.1–0.9)
MONO%: 8.6 % (ref 0.0–14.0)
NEUT%: 68.8 % (ref 38.4–76.8)
NEUTROS ABS: 4.3 10*3/uL (ref 1.5–6.5)
Platelets: 154 10*3/uL (ref 145–400)
RBC: 3.07 10*6/uL — ABNORMAL LOW (ref 3.70–5.45)
RDW: 21.6 % — AB (ref 11.2–14.5)
WBC: 6.3 10*3/uL (ref 3.9–10.3)
lymph#: 1.3 10*3/uL (ref 0.9–3.3)

## 2013-04-27 LAB — COMPREHENSIVE METABOLIC PANEL (CC13)
ALBUMIN: 3.5 g/dL (ref 3.5–5.0)
ALK PHOS: 100 U/L (ref 40–150)
ALT: 8 U/L (ref 0–55)
ANION GAP: 15 meq/L — AB (ref 3–11)
AST: 17 U/L (ref 5–34)
BUN: 7.5 mg/dL (ref 7.0–26.0)
CO2: 26 meq/L (ref 22–29)
Calcium: 6.7 mg/dL — ABNORMAL LOW (ref 8.4–10.4)
Chloride: 103 mEq/L (ref 98–109)
Creatinine: 1 mg/dL (ref 0.6–1.1)
GLUCOSE: 197 mg/dL — AB (ref 70–140)
POTASSIUM: 3.2 meq/L — AB (ref 3.5–5.1)
Sodium: 145 mEq/L (ref 136–145)
Total Bilirubin: 0.3 mg/dL (ref 0.20–1.20)
Total Protein: 6.6 g/dL (ref 6.4–8.3)

## 2013-04-27 MED ORDER — TRASTUZUMAB CHEMO INJECTION 440 MG
6.0000 mg/kg | Freq: Once | INTRAVENOUS | Status: AC
  Administered 2013-04-27: 756 mg via INTRAVENOUS
  Filled 2013-04-27: qty 36

## 2013-04-27 MED ORDER — SODIUM CHLORIDE 0.9 % IV SOLN
Freq: Once | INTRAVENOUS | Status: AC
  Administered 2013-04-27: 10:00:00 via INTRAVENOUS

## 2013-04-27 MED ORDER — HEPARIN SOD (PORK) LOCK FLUSH 100 UNIT/ML IV SOLN
500.0000 [IU] | Freq: Once | INTRAVENOUS | Status: AC | PRN
  Administered 2013-04-27: 500 [IU]
  Filled 2013-04-27: qty 5

## 2013-04-27 MED ORDER — SODIUM CHLORIDE 0.9 % IV SOLN
180.0000 mg | Freq: Once | INTRAVENOUS | Status: AC
  Administered 2013-04-27: 180 mg via INTRAVENOUS
  Filled 2013-04-27: qty 18

## 2013-04-27 MED ORDER — ACETAMINOPHEN 325 MG PO TABS
ORAL_TABLET | ORAL | Status: AC
  Filled 2013-04-27: qty 2

## 2013-04-27 MED ORDER — DEXAMETHASONE SODIUM PHOSPHATE 20 MG/5ML IJ SOLN
INTRAMUSCULAR | Status: AC
Start: 2013-04-27 — End: 2013-04-27
  Filled 2013-04-27: qty 5

## 2013-04-27 MED ORDER — DOCETAXEL CHEMO INJECTION 160 MG/16ML
75.0000 mg/m2 | Freq: Once | INTRAVENOUS | Status: DC
  Filled 2013-04-27: qty 18

## 2013-04-27 MED ORDER — DEXAMETHASONE SODIUM PHOSPHATE 20 MG/5ML IJ SOLN
20.0000 mg | Freq: Once | INTRAMUSCULAR | Status: AC
  Administered 2013-04-27: 20 mg via INTRAVENOUS

## 2013-04-27 MED ORDER — SODIUM CHLORIDE 0.9 % IJ SOLN
10.0000 mL | INTRAMUSCULAR | Status: DC | PRN
  Administered 2013-04-27: 10 mL
  Filled 2013-04-27: qty 10

## 2013-04-27 MED ORDER — SODIUM CHLORIDE 0.9 % IV SOLN
880.0000 mg | Freq: Once | INTRAVENOUS | Status: AC
  Administered 2013-04-27: 880 mg via INTRAVENOUS
  Filled 2013-04-27: qty 88

## 2013-04-27 MED ORDER — SODIUM CHLORIDE 0.9 % IV SOLN
420.0000 mg | Freq: Once | INTRAVENOUS | Status: AC
  Administered 2013-04-27: 420 mg via INTRAVENOUS
  Filled 2013-04-27: qty 14

## 2013-04-27 MED ORDER — ONDANSETRON 16 MG/50ML IVPB (CHCC)
16.0000 mg | Freq: Once | INTRAVENOUS | Status: AC
  Administered 2013-04-27: 16 mg via INTRAVENOUS

## 2013-04-27 MED ORDER — DIPHENHYDRAMINE HCL 25 MG PO CAPS
ORAL_CAPSULE | ORAL | Status: AC
  Filled 2013-04-27: qty 2

## 2013-04-27 MED ORDER — DIPHENHYDRAMINE HCL 25 MG PO CAPS
50.0000 mg | ORAL_CAPSULE | Freq: Once | ORAL | Status: AC
  Administered 2013-04-27: 50 mg via ORAL

## 2013-04-27 MED ORDER — ONDANSETRON 16 MG/50ML IVPB (CHCC)
INTRAVENOUS | Status: AC
  Filled 2013-04-27: qty 16

## 2013-04-27 MED ORDER — ACETAMINOPHEN 325 MG PO TABS
650.0000 mg | ORAL_TABLET | Freq: Once | ORAL | Status: AC
  Administered 2013-04-27: 650 mg via ORAL

## 2013-04-27 NOTE — Progress Notes (Signed)
[facility] OFFICE PROGRESS NOTE  Patient Care Team: Mikey Kirschner, MD as PCP - General (Family Medicine) Gearlean Alf, MD (Orthopedic Surgery) Dr. Thea Silversmith  Dr. Erroll Luna  DIAGNOSIS: 58 year old female with new diagnosis of T1 N1 (stage II) right breast cancer.  STAGE:  Breast cancer of upper-outer quadrant of right female breast  Primary site: Breast (Right)  Staging method: AJCC 7th Edition  Clinical: Stage IIA (T1c, N1, cM0)  Summary: Stage IIA (T1c, N1, cM0)  SUMMARY OF ONCOLOGIC HISTORY: #1 Patient palpated a right breast mass. She had a mammogram that revealed a 1.8 cm lesion in the right breast. There was also noted to be enlarged lymph node by ultrasound. Patient could not tolerate MRI. 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 The biopsied right lymph node was positive for metastatic disease.  #2[name] on neoadjuvant chemotherapy consisting of Taxotere carboplatinum Herceptin/Perjeta beginning 02/22/2013. Total of 6 cycles of therapy is planned.   CURRENT THERAPY: Neoadjuvant cycle 4 day 1 of TCH/Perjeta  INTERVAL HISTORY: [name] 58 y.o. female returns for followup visit. She is here for cycle 4 of her neoadjuvant chemotherapy. Thus far I am thrilled that she has tolerated her chemotherapy well. He is not having any nausea vomiting fevers chills night sweats no diarrhea. Her tingling numbness is improving significantly. It has not worsened. She is keeping an eye on. Remainder of the 10 point review of systems is negative and as below   Past Medical History  Diagnosis Date  . Hyperlipidemia   . Obesity   . Asthma     prn neb. and inhaler  . Osteoarthritis 2003    left knee  . Non-insulin dependent type 2 diabetes mellitus   . Graves' disease   . Ophthalmic manifestation of Graves disease   . Hypertension     on multiple meds., has been on med. > 14 yr.  .  Breast cancer 01/2013    right  . Dehydration 04/13/2013   Past Surgical History  Procedure Laterality Date  . Total knee arthroplasty Left 2003  . Total thyroidectomy    . Colonoscopy  2007  . Tubal ligation  1985  . Colonoscopy w/ polypectomy  2009  . Portacath placement Right 02/17/2013    Procedure: INSERTION PORT-A-CATH;  Surgeon: Joyice Faster. Cornett, MD;  Location: [facility];  Service: General;  Laterality: Right;   History   Social History  . Marital Status: Divorced    Spouse Name: N/A    Number of Children: 2  . Years of Education: N/A   Occupational History  . Disability    Social History Main Topics  . Smoking status: Former Smoker -- 20 years    Quit date: 02/10/1991  . Smokeless tobacco: Never Used  . Alcohol Use: No  . Drug Use: No  . Sexual Activity: No   Other Topics Concern  . Not on file   Social History Narrative   Lives in [place]:  is allergic to procardia.  MEDICATIONS:  Current Outpatient Prescriptions  Medication Sig Dispense Refill  . ACCU-CHEK SMARTVIEW test strip USE ONE STRIP TO CHECK GLUCOSE 4 TIMES DAILY  100 each  5  . albuterol (PROVENTIL HFA;VENTOLIN HFA) 108 (90 BASE) MCG/ACT inhaler Inhale 2 puffs into the lungs every 4 (four) hours as needed.  18 g  2  .  albuterol (PROVENTIL) (5 MG/ML) 0.5% nebulizer solution Take 2.5 mg by nebulization every 6 (six) hours as needed for wheezing or shortness of breath.      Marland Kitchen amLODipine (NORVASC) 10 MG tablet TAKE ONE TABLET BY MOUTH ONCE DAILY.  90 tablet  0  . Artificial Tear Ointment (ARTIFICIAL TEARS) ointment Place 1 drop into both eyes as needed.       Marland Kitchen aspirin 81 MG tablet Take 81 mg by mouth daily.      . benazepril (LOTENSIN) 40 MG tablet TAKE ONE TABLET BY MOUTH ONCE DAILY.  30 tablet  5  . cloNIDine (CATAPRES - DOSED IN MG/24 HR) 0.2 mg/24hr patch APPLY 1 PATCH TRANSDERMALLY ONCE A WEEK.  4 patch  5  . cycloSPORINE (RESTASIS) 0.05 % ophthalmic  emulsion Place 1 drop into both eyes 2 (two) times daily.       Marland Kitchen dexamethasone (DECADRON) 4 MG tablet       . gabapentin (NEURONTIN) 100 MG capsule Take 1 capsule (100 mg total) by mouth 3 (three) times daily.  90 capsule  5  . hydrochlorothiazide (HYDRODIURIL) 25 MG tablet TAKE ONE TABLET BY MOUTH ONCE DAILY.  30 tablet  2  . labetalol (NORMODYNE) 300 MG tablet TAKE (1) TABLET BY MOUTH TWICE DAILY.  60 tablet  3  . LANTUS SOLOSTAR 100 UNIT/ML Solostar Pen Inject 5 Units into the skin. Take 5 units in am.      . levothyroxine (SYNTHROID, LEVOTHROID) 100 MCG tablet Take 100 mcg by mouth every morning.      . lidocaine-prilocaine (EMLA) cream Apply 1 application topically as needed.  30 g  0  . metFORMIN (GLUCOPHAGE) 500 MG tablet TAKE (1) TABLET BY MOUTH TWICE A DAY WITH MEALS (BREAKFAST AND SUPPER).  60 tablet  2  . omeprazole (PRILOSEC) 20 MG capsule Take 1 capsule (20 mg total) by mouth daily.  30 capsule  1  . ondansetron (ZOFRAN ODT) 4 MG disintegrating tablet Take 1 tablet (4 mg total) by mouth every 8 (eight) hours as needed for nausea.  10 tablet  0  . oxyCODONE-acetaminophen (PERCOCET/ROXICET) 5-325 MG per tablet       . pravastatin (PRAVACHOL) 80 MG tablet Take 1 tablet (80 mg total) by mouth at bedtime.  30 tablet  6  . vitamin B-12 (CYANOCOBALAMIN) 500 MCG tablet Take 500 mcg by mouth daily.      . prochlorperazine (COMPAZINE) 10 MG tablet        No current facility-administered medications for this visit.    REVIEW OF SYSTEMS:   Constitutional: Denies fevers, chills or abnormal weight loss Eyes: Denies blurriness of vision Ears, nose, mouth, throat, and face: Denies mucositis or sore throat Respiratory: Denies cough, dyspnea or wheezes Cardiovascular: Denies palpitation, chest discomfort or lower extremity swelling Gastrointestinal:  Denies nausea, heartburn, positive for diarrhea Skin: Denies abnormal skin rashes Lymphatics: Denies new lymphadenopathy or easy  bruising Neurological:Denies numbness, tingling or new weaknesses Behavioral/Psych: Mood is stable, no new changes  All other systems were reviewed with the patient and are negative.  PHYSICAL EXAMINATION: ECOG PERFORMANCE STATUS: 1 - Symptomatic but completely ambulatory  Filed Vitals:   04/27/13 0905  BP: 116/74  Pulse: 85  Temp: 98.5 F (36.9 C)  Resp: 18   Filed Weights   04/27/13 0905  Weight: 258 lb 11.2 oz (117.346 kg)    GENERAL:alert, no distress and comfortable SKIN: skin color, texture, turgor are normal, no rashes or significant lesions EYES: normal, Conjunctiva are  pink and non-injected, sclera clear OROPHARYNX:no exudate, no erythema and lips, buccal mucosa, and tongue normal  NECK: supple, thyroid normal size, non-tender, without nodularity LYMPH:  no palpable lymphadenopathy in the cervical, axillary or inguinal LUNGS: clear to auscultation and percussion with normal breathing effort HEART: regular rate & rhythm and no murmurs and no lower extremity edema ABDOMEN:abdomen soft, non-tender and normal bowel sounds Musculoskeletal:no cyanosis of digits and no clubbing  NEURO: alert & oriented x 3 with fluent speech, no focal motor/sensory deficits Right breast: No palpable masses no nipple discharge no skin changes Port-A-Cath: No evidence of infections.  LABORATORY DATA:  I have reviewed the data as listed    Component Value Date/Time   NA 137 04/13/2013 0757   NA 137 03/19/2013 0247   K 3.6 04/13/2013 0757   K 3.8 03/19/2013 0247   CL 92* 03/19/2013 0247   CO2 21* 04/13/2013 0757   CO2 30 03/19/2013 0247   GLUCOSE 280* 04/13/2013 0757   GLUCOSE 463* 03/19/2013 0247   BUN 19.4 04/13/2013 0757   BUN 20 03/19/2013 0247   CREATININE 1.5* 04/13/2013 0757   CREATININE 0.92 03/19/2013 0247   CREATININE 0.96 10/06/2012 0830   CALCIUM 8.7 04/13/2013 0757   CALCIUM 8.9 03/19/2013 0247   PROT 6.6 04/13/2013 0805   PROT 7.1 04/13/2013 0757   ALBUMIN 4.1 04/13/2013 0805   ALBUMIN 4.0 04/13/2013  0757   AST 26 04/13/2013 0805   AST 29 04/13/2013 0757   ALT 30 04/13/2013 0805   ALT 34 04/13/2013 0757   ALKPHOS 105 04/13/2013 0805   ALKPHOS 128 04/13/2013 0757   BILITOT 0.5 04/13/2013 0805   BILITOT 0.53 04/13/2013 0757   GFRNONAA 67* 03/19/2013 0247   GFRAA 78* 03/19/2013 0247    No results found for this basename: SPEP,  UPEP,   kappa and lambda light chains    Lab Results  Component Value Date   WBC 6.3 04/27/2013   NEUTROABS 4.3 04/27/2013   HGB 8.7* 04/27/2013   HCT 26.1* 04/27/2013   MCV 85.0 04/27/2013   PLT 154 04/27/2013      Chemistry      Component Value Date/Time   NA 137 04/13/2013 0757   NA 137 03/19/2013 0247   K 3.6 04/13/2013 0757   K 3.8 03/19/2013 0247   CL 92* 03/19/2013 0247   CO2 21* 04/13/2013 0757   CO2 30 03/19/2013 0247   BUN 19.4 04/13/2013 0757   BUN 20 03/19/2013 0247   CREATININE 1.5* 04/13/2013 0757   CREATININE 0.92 03/19/2013 0247   CREATININE 0.96 10/06/2012 0830      Component Value Date/Time   CALCIUM 8.7 04/13/2013 0757   CALCIUM 8.9 03/19/2013 0247   ALKPHOS 105 04/13/2013 0805   ALKPHOS 128 04/13/2013 0757   AST 26 04/13/2013 0805   AST 29 04/13/2013 0757   ALT 30 04/13/2013 0805   ALT 34 04/13/2013 0757   BILITOT 0.5 04/13/2013 0805   BILITOT 0.53 04/13/2013 0757       RADIOGRAPHIC STUDIES: I have personally reviewed the radiological images as listed and agreed with the findings in the report. No results found.    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 Patient is receiving neoadjuvant chemotherapy consisting of Taxotere carboplatinum Herceptin/perjeta. Total of 6 cycles is planned. This will then be followed by restaging MRI of the breasts then  eventual surgery but possibly a lumpectomy area patient will proceed with cycle 4 day 1 of TCH/P. today. Overall she's tolerating it well. Exam does reveal reduction in the size of her mass. #2 anemia:  Hemoglobin is 8.7. Patient is asymptomatic. We will continue to monitor. Patient is reminded to call us should she started having increasing shortness of breath chest pains or any features consistent with progressive anemia. She may require transfusions eventually possibly next week.  #3 dehydration: Patient does have a tendency of becoming significantly dehydrated. Because of this she is on IV fluids tomorrow Friday and Saturday.  #4 diarrhea: Patient does not have diarrhea currently but she does have a tendency getting diarrhea after her treatments. She is counseled on taking care of this very quickly and call us if he becomes profuse.  #5 diabetes: This is well-controlled. We will continue to monitor.  #6 neuropathy: Very minimal pins and needles sensation in the fingertips. She does not feel that she needs anything such as gabapentin. She is on B12 orally here  #7 followup: Patient will be seen back in one week for followup. But she was advised to call me sooner if need arises  No orders of the defined types were placed in this encounter.   All questions were answered. The patient knows to call the clinic with any problems, questions or concerns. No barriers to learning was detected. I spent 20 minutes counseling the patient face to face. The total time spent in the appointment was 30 minutes and more than 50% was on counseling and review of test results and coordination of care     Deatra Robinson, MD 04/27/2013 9:17 AM

## 2013-04-27 NOTE — Patient Instructions (Signed)
[facility] Discharge Instructions for Patients Receiving Chemotherapy  Today you received the following chemotherapy agents Herceptin, Perjeta, Taxotere, and Carboplatin.  To help prevent nausea and vomiting after your treatment, we encourage you to take your nausea medication Zofran 8 mg OTC every 8 hours as needed.  If you develop nausea and vomiting that is not controlled by your nausea medication, call the clinic.   BELOW ARE SYMPTOMS THAT SHOULD BE REPORTED IMMEDIATELY:  *FEVER GREATER THAN 100.5 F  *CHILLS WITH OR WITHOUT FEVER  NAUSEA AND VOMITING THAT IS NOT CONTROLLED WITH YOUR NAUSEA MEDICATION  *UNUSUAL SHORTNESS OF BREATH  *UNUSUAL BRUISING OR BLEEDING  TENDERNESS IN MOUTH AND THROAT WITH OR WITHOUT PRESENCE OF ULCERS  *URINARY PROBLEMS  *BOWEL PROBLEMS  UNUSUAL RASH Items with * indicate a potential emergency and should be followed up as soon as possible.  Feel free to call the clinic you have any questions or concerns. The clinic phone number is [phone].

## 2013-04-27 NOTE — Patient Instructions (Signed)
PROCEED WITH CYCLE 4 OF CHEMOTHERAPY

## 2013-04-28 ENCOUNTER — Ambulatory Visit (HOSPITAL_BASED_OUTPATIENT_CLINIC_OR_DEPARTMENT_OTHER): Payer: Medicare Other

## 2013-04-28 ENCOUNTER — Ambulatory Visit: Payer: Medicare Other

## 2013-04-28 VITALS — BP 153/85 | HR 85 | Temp 98.7°F | Resp 22

## 2013-04-28 DIAGNOSIS — Z5189 Encounter for other specified aftercare: Secondary | ICD-10-CM

## 2013-04-28 DIAGNOSIS — C50411 Malignant neoplasm of upper-outer quadrant of right female breast: Secondary | ICD-10-CM

## 2013-04-28 DIAGNOSIS — E860 Dehydration: Secondary | ICD-10-CM

## 2013-04-28 DIAGNOSIS — C50419 Malignant neoplasm of upper-outer quadrant of unspecified female breast: Secondary | ICD-10-CM

## 2013-04-28 MED ORDER — PEGFILGRASTIM INJECTION 6 MG/0.6ML
6.0000 mg | Freq: Once | SUBCUTANEOUS | Status: AC
  Administered 2013-04-28: 6 mg via SUBCUTANEOUS
  Filled 2013-04-28: qty 0.6

## 2013-04-28 MED ORDER — SODIUM CHLORIDE 0.9 % IJ SOLN
10.0000 mL | INTRAMUSCULAR | Status: DC | PRN
  Administered 2013-04-28: 10 mL via INTRAVENOUS
  Filled 2013-04-28: qty 10

## 2013-04-28 MED ORDER — SODIUM CHLORIDE 0.9 % IV SOLN
Freq: Once | INTRAVENOUS | Status: AC
  Administered 2013-04-28: 09:00:00 via INTRAVENOUS

## 2013-04-28 MED ORDER — HEPARIN SOD (PORK) LOCK FLUSH 100 UNIT/ML IV SOLN
500.0000 [IU] | Freq: Once | INTRAVENOUS | Status: AC
  Administered 2013-04-28: 500 [IU] via INTRAVENOUS
  Filled 2013-04-28: qty 5

## 2013-04-28 NOTE — Patient Instructions (Signed)
Dehydration, Adult Dehydration is when you lose more fluids from the body than you take in. Vital organs like the kidneys, brain, and heart cannot function without a proper amount of fluids and salt. Any loss of fluids from the body can cause dehydration.  CAUSES   Vomiting.  Diarrhea.  Excessive sweating.  Excessive urine output.  Fever. SYMPTOMS  Mild dehydration  Thirst.  Dry lips.  Slightly dry mouth. Moderate dehydration  Very dry mouth.  Sunken eyes.  Skin does not bounce back quickly when lightly pinched and released.  Dark urine and decreased urine production.  Decreased tear production.  Headache. Severe dehydration  Very dry mouth.  Extreme thirst.  Rapid, weak pulse (more than 100 beats per minute at rest).  Cold hands and feet.  Not able to sweat in spite of heat and temperature.  Rapid breathing.  Blue lips.  Confusion and lethargy.  Difficulty being awakened.  Minimal urine production.  No tears. DIAGNOSIS  Your caregiver will diagnose dehydration based on your symptoms and your exam. Blood and urine tests will help confirm the diagnosis. The diagnostic evaluation should also identify the cause of dehydration. TREATMENT  Treatment of mild or moderate dehydration can often be done at home by increasing the amount of fluids that you drink. It is best to drink small amounts of fluid more often. Drinking too much at one time can make vomiting worse. Refer to the home care instructions below. Severe dehydration needs to be treated at the hospital where you will probably be given intravenous (IV) fluids that contain water and electrolytes. HOME CARE INSTRUCTIONS   Ask your caregiver about specific rehydration instructions.  Drink enough fluids to keep your urine clear or pale yellow.  Drink small amounts frequently if you have nausea and vomiting.  Eat as you normally do.  Avoid:  Foods or drinks high in sugar.  Carbonated  drinks.  Juice.  Extremely hot or cold fluids.  Drinks with caffeine.  Fatty, greasy foods.  Alcohol.  Tobacco.  Overeating.  Gelatin desserts.  Wash your hands well to avoid spreading bacteria and viruses.  Only take over-the-counter or prescription medicines for pain, discomfort, or fever as directed by your caregiver.  Ask your caregiver if you should continue all prescribed and over-the-counter medicines.  Keep all follow-up appointments with your caregiver. SEEK MEDICAL CARE IF:  You have abdominal pain and it increases or stays in one area (localizes).  You have a rash, stiff neck, or severe headache.  You are irritable, sleepy, or difficult to awaken.  You are weak, dizzy, or extremely thirsty. SEEK IMMEDIATE MEDICAL CARE IF:   You are unable to keep fluids down or you get worse despite treatment.  You have frequent episodes of vomiting or diarrhea.  You have blood or green matter (bile) in your vomit.  You have blood in your stool or your stool looks black and tarry.  You have not urinated in 6 to 8 hours, or you have only urinated a small amount of very dark urine.  You have a fever.  You faint. MAKE SURE YOU:   Understand these instructions.  Will watch your condition.  Will get help right away if you are not doing well or get worse. Document Released: 12/30/2004 Document Revised: 03/24/2011 Document Reviewed: 08/19/2010 [employer] Patient Information 2014 [employer]  

## 2013-04-29 ENCOUNTER — Ambulatory Visit (HOSPITAL_BASED_OUTPATIENT_CLINIC_OR_DEPARTMENT_OTHER): Payer: Medicare Other

## 2013-04-29 ENCOUNTER — Other Ambulatory Visit: Payer: Self-pay | Admitting: *Deleted

## 2013-04-29 VITALS — BP 129/80 | HR 85 | Temp 98.6°F | Resp 16

## 2013-04-29 DIAGNOSIS — E860 Dehydration: Secondary | ICD-10-CM

## 2013-04-29 DIAGNOSIS — C50419 Malignant neoplasm of upper-outer quadrant of unspecified female breast: Secondary | ICD-10-CM

## 2013-04-29 MED ORDER — SODIUM CHLORIDE 0.9 % IV SOLN
INTRAVENOUS | Status: DC
  Administered 2013-04-29: 10:00:00 via INTRAVENOUS

## 2013-04-29 MED ORDER — SODIUM CHLORIDE 0.9 % IJ SOLN
10.0000 mL | INTRAMUSCULAR | Status: DC | PRN
  Administered 2013-04-29: 10 mL via INTRAVENOUS
  Filled 2013-04-29: qty 10

## 2013-04-29 MED ORDER — HEPARIN SOD (PORK) LOCK FLUSH 100 UNIT/ML IV SOLN
500.0000 [IU] | Freq: Once | INTRAVENOUS | Status: AC
Start: 2013-04-29 — End: 2013-04-29
  Administered 2013-04-29: 500 [IU] via INTRAVENOUS
  Filled 2013-04-29: qty 5

## 2013-04-29 NOTE — Patient Instructions (Signed)
Dehydration, Adult Dehydration is when you lose more fluids from the body than you take in. Vital organs like the kidneys, brain, and heart cannot function without a proper amount of fluids and salt. Any loss of fluids from the body can cause dehydration.  CAUSES   Vomiting.  Diarrhea.  Excessive sweating.  Excessive urine output.  Fever. SYMPTOMS  Mild dehydration  Thirst.  Dry lips.  Slightly dry mouth. Moderate dehydration  Very dry mouth.  Sunken eyes.  Skin does not bounce back quickly when lightly pinched and released.  Dark urine and decreased urine production.  Decreased tear production.  Headache. Severe dehydration  Very dry mouth.  Extreme thirst.  Rapid, weak pulse (more than 100 beats per minute at rest).  Cold hands and feet.  Not able to sweat in spite of heat and temperature.  Rapid breathing.  Blue lips.  Confusion and lethargy.  Difficulty being awakened.  Minimal urine production.  No tears. DIAGNOSIS  Your caregiver will diagnose dehydration based on your symptoms and your exam. Blood and urine tests will help confirm the diagnosis. The diagnostic evaluation should also identify the cause of dehydration. TREATMENT  Treatment of mild or moderate dehydration can often be done at home by increasing the amount of fluids that you drink. It is best to drink small amounts of fluid more often. Drinking too much at one time can make vomiting worse. Refer to the home care instructions below. Severe dehydration needs to be treated at the hospital where you will probably be given intravenous (IV) fluids that contain water and electrolytes. HOME CARE INSTRUCTIONS   Ask your caregiver about specific rehydration instructions.  Drink enough fluids to keep your urine clear or pale yellow.  Drink small amounts frequently if you have nausea and vomiting.  Eat as you normally do.  Avoid:  Foods or drinks high in sugar.  Carbonated  drinks.  Juice.  Extremely hot or cold fluids.  Drinks with caffeine.  Fatty, greasy foods.  Alcohol.  Tobacco.  Overeating.  Gelatin desserts.  Wash your hands well to avoid spreading bacteria and viruses.  Only take over-the-counter or prescription medicines for pain, discomfort, or fever as directed by your caregiver.  Ask your caregiver if you should continue all prescribed and over-the-counter medicines.  Keep all follow-up appointments with your caregiver. SEEK MEDICAL CARE IF:  You have abdominal pain and it increases or stays in one area (localizes).  You have a rash, stiff neck, or severe headache.  You are irritable, sleepy, or difficult to awaken.  You are weak, dizzy, or extremely thirsty. SEEK IMMEDIATE MEDICAL CARE IF:   You are unable to keep fluids down or you get worse despite treatment.  You have frequent episodes of vomiting or diarrhea.  You have blood or green matter (bile) in your vomit.  You have blood in your stool or your stool looks black and tarry.  You have not urinated in 6 to 8 hours, or you have only urinated a small amount of very dark urine.  You have a fever.  You faint. MAKE SURE YOU:   Understand these instructions.  Will watch your condition.  Will get help right away if you are not doing well or get worse. Document Released: 12/30/2004 Document Revised: 03/24/2011 Document Reviewed: 08/19/2010 [employer] Patient Information 2014 [employer]  

## 2013-04-30 ENCOUNTER — Ambulatory Visit (HOSPITAL_BASED_OUTPATIENT_CLINIC_OR_DEPARTMENT_OTHER): Payer: Medicare Other

## 2013-04-30 VITALS — BP 132/56 | HR 89 | Temp 98.3°F | Resp 18

## 2013-04-30 DIAGNOSIS — C50419 Malignant neoplasm of upper-outer quadrant of unspecified female breast: Secondary | ICD-10-CM

## 2013-04-30 DIAGNOSIS — E860 Dehydration: Secondary | ICD-10-CM

## 2013-04-30 MED ORDER — HEPARIN SOD (PORK) LOCK FLUSH 100 UNIT/ML IV SOLN
500.0000 [IU] | Freq: Once | INTRAVENOUS | Status: AC
  Administered 2013-04-30: 500 [IU] via INTRAVENOUS
  Filled 2013-04-30: qty 5

## 2013-04-30 MED ORDER — SODIUM CHLORIDE 0.9 % IV SOLN
INTRAVENOUS | Status: DC
  Administered 2013-04-30: 08:00:00 via INTRAVENOUS

## 2013-04-30 MED ORDER — SODIUM CHLORIDE 0.9 % IJ SOLN
10.0000 mL | INTRAMUSCULAR | Status: DC | PRN
  Administered 2013-04-30: 10 mL via INTRAVENOUS
  Filled 2013-04-30: qty 10

## 2013-04-30 NOTE — Patient Instructions (Signed)
Dehydration, Adult Dehydration is when you lose more fluids from the body than you take in. Vital organs like the kidneys, brain, and heart cannot function without a proper amount of fluids and salt. Any loss of fluids from the body can cause dehydration.  CAUSES   Vomiting.  Diarrhea.  Excessive sweating.  Excessive urine output.  Fever. SYMPTOMS  Mild dehydration  Thirst.  Dry lips.  Slightly dry mouth. Moderate dehydration  Very dry mouth.  Sunken eyes.  Skin does not bounce back quickly when lightly pinched and released.  Dark urine and decreased urine production.  Decreased tear production.  Headache. Severe dehydration  Very dry mouth.  Extreme thirst.  Rapid, weak pulse (more than 100 beats per minute at rest).  Cold hands and feet.  Not able to sweat in spite of heat and temperature.  Rapid breathing.  Blue lips.  Confusion and lethargy.  Difficulty being awakened.  Minimal urine production.  No tears. DIAGNOSIS  Your caregiver will diagnose dehydration based on your symptoms and your exam. Blood and urine tests will help confirm the diagnosis. The diagnostic evaluation should also identify the cause of dehydration. TREATMENT  Treatment of mild or moderate dehydration can often be done at home by increasing the amount of fluids that you drink. It is best to drink small amounts of fluid more often. Drinking too much at one time can make vomiting worse. Refer to the home care instructions below. Severe dehydration needs to be treated at the hospital where you will probably be given intravenous (IV) fluids that contain water and electrolytes. HOME CARE INSTRUCTIONS   Ask your caregiver about specific rehydration instructions.  Drink enough fluids to keep your urine clear or pale yellow.  Drink small amounts frequently if you have nausea and vomiting.  Eat as you normally do.  Avoid:  Foods or drinks high in sugar.  Carbonated  drinks.  Juice.  Extremely hot or cold fluids.  Drinks with caffeine.  Fatty, greasy foods.  Alcohol.  Tobacco.  Overeating.  Gelatin desserts.  Wash your hands well to avoid spreading bacteria and viruses.  Only take over-the-counter or prescription medicines for pain, discomfort, or fever as directed by your caregiver.  Ask your caregiver if you should continue all prescribed and over-the-counter medicines.  Keep all follow-up appointments with your caregiver. SEEK MEDICAL CARE IF:  You have abdominal pain and it increases or stays in one area (localizes).  You have a rash, stiff neck, or severe headache.  You are irritable, sleepy, or difficult to awaken.  You are weak, dizzy, or extremely thirsty. SEEK IMMEDIATE MEDICAL CARE IF:   You are unable to keep fluids down or you get worse despite treatment.  You have frequent episodes of vomiting or diarrhea.  You have blood or green matter (bile) in your vomit.  You have blood in your stool or your stool looks black and tarry.  You have not urinated in 6 to 8 hours, or you have only urinated a small amount of very dark urine.  You have a fever.  You faint. MAKE SURE YOU:   Understand these instructions.  Will watch your condition.  Will get help right away if you are not doing well or get worse. Document Released: 12/30/2004 Document Revised: 03/24/2011 Document Reviewed: 08/19/2010 [employer] Patient Information 2014 [employer]  

## 2013-05-02 ENCOUNTER — Emergency Department (HOSPITAL_COMMUNITY): Payer: Medicare Other

## 2013-05-02 ENCOUNTER — Encounter (HOSPITAL_COMMUNITY): Payer: Self-pay | Admitting: Emergency Medicine

## 2013-05-02 ENCOUNTER — Other Ambulatory Visit: Payer: Self-pay

## 2013-05-02 ENCOUNTER — Inpatient Hospital Stay (HOSPITAL_COMMUNITY)
Admission: EM | Admit: 2013-05-02 | Discharge: 2013-05-04 | DRG: 640 | Disposition: A | Discharge status: Home / Self Care | Payer: Medicare Other | Attending: Internal Medicine | Admitting: Internal Medicine

## 2013-05-02 DIAGNOSIS — E876 Hypokalemia: Secondary | ICD-10-CM | POA: Diagnosis present

## 2013-05-02 DIAGNOSIS — E872 Acidosis, unspecified: Secondary | ICD-10-CM | POA: Diagnosis present

## 2013-05-02 DIAGNOSIS — D509 Iron deficiency anemia, unspecified: Secondary | ICD-10-CM

## 2013-05-02 DIAGNOSIS — E1169 Type 2 diabetes mellitus with other specified complication: Secondary | ICD-10-CM | POA: Diagnosis present

## 2013-05-02 DIAGNOSIS — Z7982 Long term (current) use of aspirin: Secondary | ICD-10-CM

## 2013-05-02 DIAGNOSIS — E669 Obesity, unspecified: Secondary | ICD-10-CM

## 2013-05-02 DIAGNOSIS — R197 Diarrhea, unspecified: Secondary | ICD-10-CM | POA: Diagnosis present

## 2013-05-02 DIAGNOSIS — C50919 Malignant neoplasm of unspecified site of unspecified female breast: Secondary | ICD-10-CM | POA: Diagnosis present

## 2013-05-02 DIAGNOSIS — Z96659 Presence of unspecified artificial knee joint: Secondary | ICD-10-CM

## 2013-05-02 DIAGNOSIS — E860 Dehydration: Principal | ICD-10-CM

## 2013-05-02 DIAGNOSIS — E039 Hypothyroidism, unspecified: Secondary | ICD-10-CM

## 2013-05-02 DIAGNOSIS — M1990 Unspecified osteoarthritis, unspecified site: Secondary | ICD-10-CM

## 2013-05-02 DIAGNOSIS — Z8249 Family history of ischemic heart disease and other diseases of the circulatory system: Secondary | ICD-10-CM

## 2013-05-02 DIAGNOSIS — Z833 Family history of diabetes mellitus: Secondary | ICD-10-CM

## 2013-05-02 DIAGNOSIS — I10 Essential (primary) hypertension: Secondary | ICD-10-CM

## 2013-05-02 DIAGNOSIS — Z6841 Body Mass Index (BMI) 40.0 and over, adult: Secondary | ICD-10-CM

## 2013-05-02 DIAGNOSIS — Z87891 Personal history of nicotine dependence: Secondary | ICD-10-CM

## 2013-05-02 DIAGNOSIS — T451X5A Adverse effect of antineoplastic and immunosuppressive drugs, initial encounter: Secondary | ICD-10-CM | POA: Diagnosis present

## 2013-05-02 DIAGNOSIS — Z801 Family history of malignant neoplasm of trachea, bronchus and lung: Secondary | ICD-10-CM

## 2013-05-02 DIAGNOSIS — J45909 Unspecified asthma, uncomplicated: Secondary | ICD-10-CM | POA: Diagnosis present

## 2013-05-02 DIAGNOSIS — D61810 Antineoplastic chemotherapy induced pancytopenia: Secondary | ICD-10-CM | POA: Diagnosis present

## 2013-05-02 DIAGNOSIS — E209 Hypoparathyroidism, unspecified: Secondary | ICD-10-CM | POA: Diagnosis present

## 2013-05-02 DIAGNOSIS — F17201 Nicotine dependence, unspecified, in remission: Secondary | ICD-10-CM

## 2013-05-02 DIAGNOSIS — R0789 Other chest pain: Secondary | ICD-10-CM

## 2013-05-02 DIAGNOSIS — E0590 Thyrotoxicosis, unspecified without thyrotoxic crisis or storm: Secondary | ICD-10-CM

## 2013-05-02 DIAGNOSIS — N390 Urinary tract infection, site not specified: Secondary | ICD-10-CM

## 2013-05-02 DIAGNOSIS — E119 Type 2 diabetes mellitus without complications: Secondary | ICD-10-CM

## 2013-05-02 DIAGNOSIS — C50911 Malignant neoplasm of unspecified site of right female breast: Secondary | ICD-10-CM

## 2013-05-02 DIAGNOSIS — I951 Orthostatic hypotension: Secondary | ICD-10-CM

## 2013-05-02 DIAGNOSIS — E785 Hyperlipidemia, unspecified: Secondary | ICD-10-CM

## 2013-05-02 DIAGNOSIS — E0789 Other specified disorders of thyroid: Secondary | ICD-10-CM | POA: Diagnosis present

## 2013-05-02 DIAGNOSIS — C50411 Malignant neoplasm of upper-outer quadrant of right female breast: Secondary | ICD-10-CM

## 2013-05-02 DIAGNOSIS — Z9221 Personal history of antineoplastic chemotherapy: Secondary | ICD-10-CM

## 2013-05-02 LAB — COMPREHENSIVE METABOLIC PANEL
ALBUMIN: 3.5 g/dL (ref 3.5–5.2)
ALK PHOS: 120 U/L — AB (ref 39–117)
ALT: 30 U/L (ref 0–35)
AST: 53 U/L — AB (ref 0–37)
BILIRUBIN TOTAL: 0.9 mg/dL (ref 0.3–1.2)
BUN: 13 mg/dL (ref 6–23)
CO2: 30 mEq/L (ref 19–32)
Calcium: 6.5 mg/dL — ABNORMAL LOW (ref 8.4–10.5)
Chloride: 91 mEq/L — ABNORMAL LOW (ref 96–112)
Creatinine, Ser: 0.98 mg/dL (ref 0.50–1.10)
GFR calc Af Amer: 72 mL/min — ABNORMAL LOW (ref >90)
GFR calc non Af Amer: 62 mL/min — ABNORMAL LOW (ref >90)
Glucose, Bld: 160 mg/dL — ABNORMAL HIGH (ref 70–99)
POTASSIUM: 2.7 meq/L — AB (ref 3.7–5.3)
SODIUM: 139 meq/L (ref 137–147)
Total Protein: 6.4 g/dL (ref 6.0–8.3)

## 2013-05-02 LAB — BLOOD GAS, VENOUS
ACID-BASE EXCESS: 6.6 mmol/L — AB (ref 0.0–2.0)
Bicarbonate: 30.1 mEq/L — ABNORMAL HIGH (ref 20.0–24.0)
O2 SAT: 54.8 %
PO2 VEN: 31.4 mmHg (ref 30.0–45.0)
Patient temperature: 37
TCO2: 27.8 mmol/L (ref 0–100)
pCO2, Ven: 39.7 mmHg — ABNORMAL LOW (ref 45.0–50.0)
pH, Ven: 7.492 — ABNORMAL HIGH (ref 7.250–7.300)

## 2013-05-02 LAB — URINE MICROSCOPIC-ADD ON

## 2013-05-02 LAB — CBC WITH DIFFERENTIAL/PLATELET
Basophils Absolute: 0 10*3/uL (ref 0.0–0.1)
Basophils Relative: 1 % (ref 0–1)
EOS PCT: 1 % (ref 0–5)
Eosinophils Absolute: 0 10*3/uL (ref 0.0–0.7)
HCT: 27.8 % — ABNORMAL LOW (ref 36.0–46.0)
HEMOGLOBIN: 9.6 g/dL — AB (ref 12.0–15.0)
Lymphocytes Relative: 42 % (ref 12–46)
Lymphs Abs: 1.6 10*3/uL (ref 0.7–4.0)
MCH: 29.4 pg (ref 26.0–34.0)
MCHC: 34.5 g/dL (ref 30.0–36.0)
MCV: 85 fL (ref 78.0–100.0)
MONOS PCT: 3 % (ref 3–12)
Monocytes Absolute: 0.1 10*3/uL (ref 0.1–1.0)
NEUTROS PCT: 53 % (ref 43–77)
Neutro Abs: 2.2 10*3/uL (ref 1.7–7.7)
PLATELETS: 105 10*3/uL — AB (ref 150–400)
RBC: 3.27 MIL/uL — AB (ref 3.87–5.11)
RDW: 20.7 % — ABNORMAL HIGH (ref 11.5–15.5)
WBC: 3.9 10*3/uL — ABNORMAL LOW (ref 4.0–10.5)

## 2013-05-02 LAB — URINALYSIS, ROUTINE W REFLEX MICROSCOPIC
BILIRUBIN URINE: NEGATIVE
GLUCOSE, UA: NEGATIVE mg/dL
Ketones, ur: NEGATIVE mg/dL
Nitrite: NEGATIVE
PH: 7.5 (ref 5.0–8.0)
Protein, ur: NEGATIVE mg/dL
SPECIFIC GRAVITY, URINE: 1.015 (ref 1.005–1.030)
Urobilinogen, UA: 0.2 mg/dL (ref 0.0–1.0)

## 2013-05-02 LAB — TROPONIN I: Troponin I: <0.3 ng/mL (ref <0.30)

## 2013-05-02 LAB — CALCIUM, IONIZED: Calcium, Ion: 0.73 mmol/L — ABNORMAL LOW (ref 1.12–1.23)

## 2013-05-02 LAB — I-STAT CG4 LACTIC ACID, ED: LACTIC ACID, VENOUS: 3.53 mmol/L — AB (ref 0.5–2.2)

## 2013-05-02 LAB — GLUCOSE, CAPILLARY: Glucose-Capillary: 151 mg/dL — ABNORMAL HIGH (ref 70–99)

## 2013-05-02 LAB — LIPASE, BLOOD: Lipase: 47 U/L (ref 11–59)

## 2013-05-02 LAB — KETONES, QUALITATIVE: Acetone, Bld: NEGATIVE

## 2013-05-02 MED ORDER — INSULIN GLARGINE 100 UNIT/ML ~~LOC~~ SOLN
5.0000 [IU] | Freq: Every day | SUBCUTANEOUS | Status: DC
  Administered 2013-05-03 – 2013-05-04 (×2): 5 [IU] via SUBCUTANEOUS
  Filled 2013-05-02 (×4): qty 0.05

## 2013-05-02 MED ORDER — ASPIRIN EC 81 MG PO TBEC
81.0000 mg | DELAYED_RELEASE_TABLET | Freq: Every day | ORAL | Status: DC
  Administered 2013-05-02 – 2013-05-04 (×3): 81 mg via ORAL
  Filled 2013-05-02 (×3): qty 1

## 2013-05-02 MED ORDER — ONDANSETRON HCL 4 MG/2ML IJ SOLN
4.0000 mg | Freq: Once | INTRAMUSCULAR | Status: AC
  Administered 2013-05-02: 4 mg via INTRAVENOUS
  Filled 2013-05-02: qty 2

## 2013-05-02 MED ORDER — BENAZEPRIL HCL 10 MG PO TABS
40.0000 mg | ORAL_TABLET | Freq: Every day | ORAL | Status: DC
  Administered 2013-05-02 – 2013-05-04 (×3): 40 mg via ORAL
  Filled 2013-05-02 (×3): qty 4

## 2013-05-02 MED ORDER — ONDANSETRON HCL 4 MG PO TABS: 4.0000 mg | ORAL_TABLET | Freq: Four times a day (QID) | ORAL | Status: DC | PRN

## 2013-05-02 MED ORDER — SODIUM CHLORIDE 0.9 % IV SOLN
1.0000 g | Freq: Once | INTRAVENOUS | Status: DC
  Filled 2013-05-02: qty 10

## 2013-05-02 MED ORDER — ACETAMINOPHEN 650 MG RE SUPP: 650.0000 mg | Freq: Four times a day (QID) | RECTAL | Status: DC | PRN

## 2013-05-02 MED ORDER — CLONIDINE HCL 0.2 MG/24HR TD PTWK
0.2000 mg | MEDICATED_PATCH | TRANSDERMAL | Status: DC
  Administered 2013-05-02: 0.2 mg via TRANSDERMAL
  Filled 2013-05-02: qty 1

## 2013-05-02 MED ORDER — CYCLOSPORINE 0.05 % OP EMUL
1.0000 [drp] | Freq: Two times a day (BID) | OPHTHALMIC | Status: DC
  Administered 2013-05-03 – 2013-05-04 (×3): 1 [drp] via OPHTHALMIC
  Filled 2013-05-02 (×11): qty 1

## 2013-05-02 MED ORDER — POTASSIUM CHLORIDE IN NACL 20-0.9 MEQ/L-% IV SOLN
INTRAVENOUS | Status: DC
  Administered 2013-05-02 – 2013-05-04 (×4): via INTRAVENOUS

## 2013-05-02 MED ORDER — POTASSIUM CHLORIDE CRYS ER 20 MEQ PO TBCR
40.0000 meq | EXTENDED_RELEASE_TABLET | Freq: Once | ORAL | Status: AC
  Administered 2013-05-02: 40 meq via ORAL
  Filled 2013-05-02: qty 2

## 2013-05-02 MED ORDER — LEVOTHYROXINE SODIUM 100 MCG PO TABS
100.0000 ug | ORAL_TABLET | Freq: Every day | ORAL | Status: DC
  Administered 2013-05-03: 100 ug via ORAL
  Filled 2013-05-02: qty 1

## 2013-05-02 MED ORDER — VITAMIN B-12 1000 MCG PO TABS
500.0000 ug | ORAL_TABLET | Freq: Every day | ORAL | Status: DC
  Administered 2013-05-02 – 2013-05-04 (×3): 500 ug via ORAL
  Filled 2013-05-02 (×3): qty 1

## 2013-05-02 MED ORDER — ONDANSETRON HCL 4 MG/2ML IJ SOLN: 4.0000 mg | Freq: Four times a day (QID) | INTRAMUSCULAR | Status: DC | PRN

## 2013-05-02 MED ORDER — SODIUM CHLORIDE 0.9 % IJ SOLN
3.0000 mL | Freq: Two times a day (BID) | INTRAMUSCULAR | Status: DC
  Administered 2013-05-02 – 2013-05-03 (×2): 3 mL via INTRAVENOUS

## 2013-05-02 MED ORDER — ENOXAPARIN SODIUM 40 MG/0.4ML ~~LOC~~ SOLN
40.0000 mg | SUBCUTANEOUS | Status: DC
Start: 2013-05-02 — End: 2013-05-04
  Administered 2013-05-02 – 2013-05-03 (×2): 40 mg via SUBCUTANEOUS
  Filled 2013-05-02 (×3): qty 0.4

## 2013-05-02 MED ORDER — INSULIN ASPART 100 UNIT/ML ~~LOC~~ SOLN
0.0000 [IU] | Freq: Three times a day (TID) | SUBCUTANEOUS | Status: DC
Start: 2013-05-03 — End: 2013-05-04
  Administered 2013-05-03: 1 [IU] via SUBCUTANEOUS
  Administered 2013-05-03 (×2): 2 [IU] via SUBCUTANEOUS
  Administered 2013-05-04 (×2): 1 [IU] via SUBCUTANEOUS

## 2013-05-02 MED ORDER — SIMVASTATIN 20 MG PO TABS
20.0000 mg | ORAL_TABLET | Freq: Every day | ORAL | Status: DC
  Administered 2013-05-02 – 2013-05-04 (×3): 20 mg via ORAL
  Filled 2013-05-02 (×3): qty 1

## 2013-05-02 MED ORDER — POLYVINYL ALCOHOL 1.4 % OP SOLN: 1.0000 [drp] | OPHTHALMIC | Status: DC | PRN

## 2013-05-02 MED ORDER — LABETALOL HCL 200 MG PO TABS
300.0000 mg | ORAL_TABLET | Freq: Two times a day (BID) | ORAL | Status: DC
  Administered 2013-05-02 – 2013-05-04 (×4): 300 mg via ORAL
  Filled 2013-05-02 (×4): qty 2

## 2013-05-02 MED ORDER — INSULIN ASPART 100 UNIT/ML ~~LOC~~ SOLN: 0.0000 [IU] | Freq: Every day | SUBCUTANEOUS | Status: DC

## 2013-05-02 MED ORDER — POTASSIUM CHLORIDE 10 MEQ/100ML IV SOLN
10.0000 meq | INTRAVENOUS | Status: AC
  Administered 2013-05-02 (×2): 10 meq via INTRAVENOUS
  Filled 2013-05-02 (×2): qty 100

## 2013-05-02 MED ORDER — GABAPENTIN 100 MG PO CAPS
100.0000 mg | ORAL_CAPSULE | Freq: Three times a day (TID) | ORAL | Status: DC
  Administered 2013-05-02 – 2013-05-04 (×7): 100 mg via ORAL
  Filled 2013-05-02 (×6): qty 1

## 2013-05-02 MED ORDER — ACETAMINOPHEN 325 MG PO TABS: 650.0000 mg | ORAL_TABLET | Freq: Four times a day (QID) | ORAL | Status: DC | PRN

## 2013-05-02 MED ORDER — SODIUM CHLORIDE 0.9 % IV SOLN: INTRAVENOUS | Status: DC

## 2013-05-02 MED ORDER — SODIUM CHLORIDE 0.9 % IV BOLUS (SEPSIS)
1000.0000 mL | Freq: Once | INTRAVENOUS | Status: AC
  Administered 2013-05-02: 1000 mL via INTRAVENOUS

## 2013-05-02 MED ORDER — ALBUTEROL SULFATE (2.5 MG/3ML) 0.083% IN NEBU: 2.5000 mg | INHALATION_SOLUTION | Freq: Four times a day (QID) | RESPIRATORY_TRACT | Status: DC | PRN

## 2013-05-02 MED ORDER — PANTOPRAZOLE SODIUM 40 MG PO TBEC
40.0000 mg | DELAYED_RELEASE_TABLET | Freq: Every day | ORAL | Status: DC
Start: 2013-05-02 — End: 2013-05-04
  Administered 2013-05-02 – 2013-05-04 (×3): 40 mg via ORAL
  Filled 2013-05-02 (×3): qty 1

## 2013-05-02 NOTE — H&P (Signed)
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  Per history, the patient has a propensity for becoming dehydrated after chemotherapy. The patient actually went back to the cancer Center at [facility] and received 1 L intravenous fluids on 04/29/2013 04/30/2013. The patient continued to have some dizziness and generalized weakness. She complains of some loose stools without any hematochezia or melena. The patient had a loose stools on 05/01/2013 and 4 loose stools today. She denies any dysuria or hematuria.. She denies any fevers, chills,  vomiting, abdominal pain, syncope, headache, visual disturbance. 24 hours, the patient has complained of some chest discomfort with exertion as well as some dyspnea on exertion. She has had poor po intake.  In the ED, the patient was noted to have positive orthostatic vital signs. BMP was noted to show potassium 2.7. Hepatic enzymes showed mild elevation of AST 53 otherwise were unremarkable. Lipase was 47. The patient's CBC revealed to be dizzy 3.9 and platelets to 105,000. Lactic acid was 2.53. Serum acetone was negative. Hemoglobin was stable at 9.6. EKG showed sinus rhythm with T-wave inversion in V1 to V3 as well as T wave inversion II, III, AVF. The patient was given 1 L normal saline. Acute abdominal series was negative Assessment/Plan:  Orthostatic hypotension/dehydration -Continue IV fluids -Discontinue HCTZ Hypocalcemia -Likely due to poor oral intake, diarrhea, although cannot rule out hypoparathyroidism given the patient's history of thyroidectomy -Check vitamin D levels--1,25-vitamin D, and  25 vitamin D -Replete -The patient has had history of thyroidectomy -ionized calcium Generalize weakness -Likely related to dehydration and hypercalcemia -Check TSH -Urinalysis with reflex to urine culture Abnormal EKG/Atypical chest pain -Cycle troponins  Lactic acidosis -Likely due to hypoperfusion/volume depletion Pancytopenia -Likely due to chemotherapy -Continue to monitor closely Diabetes mellitus type 2 -Place on half home dose of Lantus -NovoLog sliding scale -Hemoglobin A1c -d/c metformin Invasive ductal carcinoma of the right breast -04/27/2013--last dose of chemotherapy -Dr. Welton Flakes is pt's oncologist Hypokalemia  -Replete  -Check magnesium   diarrhea  -Likely related to the patient's chemotherapy  -Check C. difficile PCR Hypertension -Continue labetalol -Continue clonidine TTS 2 patch Continue benazepril      Past Medical History  Diagnosis Date  . Hyperlipidemia   . Obesity   . Asthma     prn neb. and inhaler  . Osteoarthritis 2003    left knee  . Non-insulin dependent type 2 diabetes mellitus   . Graves' disease   . Ophthalmic manifestation of Graves disease   . Hypertension     on multiple meds., has been on med. > 14 yr.  . Dehydration 04/13/2013  . Breast cancer 01/2013    right   Past Surgical History  Procedure Laterality Date  . Total knee arthroplasty Left 2003  . Total thyroidectomy    . Colonoscopy  2007  . Tubal ligation  1985  . Colonoscopy w/ polypectomy  2009  . Portacath placement Right 02/17/2013    Procedure: INSERTION PORT-A-CATH;  Surgeon: Clovis Pu. Cornett, MD;  Location: [facility];  Service: General;  Laterality: Right;   Social History:  reports that she quit smoking about 22 years ago. She has never used  smokeless tobacco. She reports that she does not drink alcohol or use illicit drugs.   Family History  Problem Relation Age of Onset  . Heart disease Father   . Lung cancer Father   . Diabetes Mother        Allergies  Allergen Reactions  . Procardia [Nifedipine] Other (See Comments)    MIGRAINES      Prior to Admission medications   Medication Sig Start Date End Date Taking? Authorizing Provider  albuterol (PROVENTIL HFA;VENTOLIN HFA) 108 (90 BASE) MCG/ACT inhaler Inhale 2 puffs into the lungs every 4 (four) hours as needed. 02/02/13  Yes Mikey Kirschner, MD  albuterol (PROVENTIL) (5 MG/ML) 0.5% nebulizer solution Take 2.5 mg by nebulization every 6 (six) hours as needed for wheezing or shortness of breath.   Yes Historical Provider, MD  amLODipine (NORVASC) 10 MG tablet TAKE ONE TABLET BY MOUTH ONCE DAILY. 03/14/13  Yes Mikey Kirschner, MD  Artificial Tear Ointment (ARTIFICIAL TEARS) ointment Place 1 drop into both eyes as needed (for grey eye disease).    Yes Historical Provider, MD  aspirin 81 MG tablet Take 81 mg by mouth daily.   Yes Historical Provider, MD  benazepril (LOTENSIN) 40 MG tablet TAKE ONE TABLET BY MOUTH ONCE DAILY. 04/14/13  Yes Mikey Kirschner, MD  cloNIDine (CATAPRES - DOSED IN MG/24 HR) 0.2 mg/24hr patch APPLY 1 PATCH TRANSDERMALLY ONCE A WEEK. 04/14/13  Yes Mikey Kirschner, MD  cycloSPORINE (RESTASIS) 0.05 % ophthalmic emulsion Place 1 drop into both eyes 2 (two) times daily.    Yes Historical Provider, MD  gabapentin (NEURONTIN) 100 MG capsule Take 1 capsule (100 mg total) by mouth 3 (three) times daily. 04/13/13  Yes Deatra Robinson, MD  hydrochlorothiazide (HYDRODIURIL) 25 MG tablet TAKE ONE TABLET BY MOUTH ONCE DAILY. 04/01/13  Yes Mikey Kirschner, MD  ibuprofen (ADVIL,MOTRIN) 200 MG tablet Take 400 mg by mouth every 8 (eight) hours as needed for moderate pain.   Yes Historical Provider, MD  labetalol (NORMODYNE) 300 MG tablet TAKE (1) TABLET BY MOUTH TWICE DAILY. 01/24/13  Yes Mikey Kirschner, MD  LANTUS SOLOSTAR 100 UNIT/ML Solostar Pen Inject 7-18 Units into the skin daily. Per sliding scale. 02/21/13  Yes Historical Provider, MD  levothyroxine (SYNTHROID,  LEVOTHROID) 100 MCG tablet Take 100 mcg by mouth every morning.   Yes Historical Provider, MD  metFORMIN (GLUCOPHAGE) 500 MG tablet TAKE (1) TABLET BY MOUTH TWICE A DAY WITH MEALS (BREAKFAST AND SUPPER). 04/22/13  Yes Mikey Kirschner, MD  omeprazole (PRILOSEC) 20 MG capsule Take 1 capsule (20 mg total) by mouth daily. 03/04/13  Yes Wilmon Arms, MD  ondansetron (ZOFRAN ODT) 4 MG disintegrating tablet Take 1 tablet (4 mg total) by mouth every 8 (eight) hours as needed for nausea. 03/19/13  Yes Johnna Acosta, MD  pravastatin (PRAVACHOL) 40 MG tablet Take 40 mg by mouth 2 (two) times daily at 8 am and 10 pm.   Yes Historical Provider, MD  prochlorperazine (COMPAZINE) 10 MG tablet Take 10 mg by mouth daily as needed for nausea or vomiting.  02/09/13  Yes Historical Provider, MD  vitamin B-12 (CYANOCOBALAMIN) 500 MCG tablet Take 500 mcg by mouth daily.   Yes Historical Provider, MD  ACCU-CHEK SMARTVIEW test strip USE ONE STRIP TO CHECK GLUCOSE 4 TIMES DAILY 10/01/12   Mikey Kirschner, MD  dexamethasone (DECADRON) 4 MG tablet Take 4 mg by mouth daily. Take 1 tablet the day before chemo and 3  days after chemo. 02/15/13   Historical Provider, MD    Review of Systems:  Constitutional:  No weight loss, night sweats, Fevers, chills,  Head&Eyes: No headache.  No vision loss.  No eye pain or scotoma ENT:  No Difficulty swallowing,Tooth/dental problems,Sore throat,   Cardio-vascular:  No chest pain, Orthopnea, PND, swelling in lower extremities,  dizziness, palpitations  GI:  No  abdominal pain,vomiting, diarrhea, loss of appetite, hematochezia, melena, heartburn, indigestion, Resp:  No shortness of breath with exertion or at rest. No cough. No coughing up of blood .No wheezing.No chest wall deformity  Skin:  no rash or lesions.  GU:  no dysuria, change in color of urine, no urgency or frequency. No flank pain.  Musculoskeletal:  No joint pain or swelling. No decreased range of motion. No back pain.   Psych:  No change in mood or affect. No depression or anxiety. Neurologic: No headache, no dysesthesia, no focal weakness, no vision loss. No syncope  Physical Exam: Filed Vitals:   05/02/13 1335 05/02/13 1336 05/02/13 1404 05/02/13 1545  BP: 149/89 152/94  116/63  Pulse: 104 121 89 94  Temp:      TempSrc:      Resp:  28  18  Height:      Weight:      SpO2:  96%  98%   General:  A&O x 3, NAD, nontoxic, pleasant/cooperative Head/Eye: No conjunctival hemorrhage, no icterus, [place]/AT, No nystagmus ENT:  No icterus,  No thrush, good dentition, no pharyngeal exudate Neck:  No masses, no lymphadenpathy, no bruits CV:  RRR, no rub, no gallop, no S3 Lung:  CTAB, good air movement, no wheeze, no rhonchi Abdomen: soft/NT, +BS, nondistended, no peritoneal signs Ext: No cyanosis, No rashes, No petechiae, No lymphangitis, No edema Neuro: CNII-XII intact, strength 4/5 in bilateral upper and lower extremities, no dysmetria  Labs on Admission:  Basic Metabolic Panel:  Recent Labs Lab 04/27/13 0852 05/02/13 1245  NA 145 139  K 3.2* 2.7*  CL  --  91*  CO2 26 30  GLUCOSE 197* 160*  BUN 7.5 13  CREATININE 1.0 0.98  CALCIUM 6.7* 6.5*   Liver Function Tests:  Recent Labs Lab 04/27/13 0852 05/02/13 1245  AST 17 53*  ALT 8 30  ALKPHOS 100 120*  BILITOT 0.30 0.9  PROT 6.6 6.4  ALBUMIN 3.5 3.5    Recent Labs Lab 05/02/13 1245  LIPASE 47   No results found for this basename: AMMONIA,  in the last 168 hours CBC:  Recent Labs Lab 04/27/13 0852 05/02/13 1245  WBC 6.3 3.9*  NEUTROABS 4.3 2.2  HGB 8.7* 9.6*  HCT 26.1* 27.8*  MCV 85.0 85.0  PLT 154 105*   Cardiac Enzymes:  Recent Labs Lab 05/02/13 1245  TROPONINI <0.30   BNP: No components found with this basename: POCBNP,  CBG: No results found for this basename: GLUCAP,  in the last 168 hours  Radiological Exams on Admission: Dg Abd Acute W/chest  05/02/2013   CLINICAL DATA:  Nausea and diarrhea since  yesterday. History of breast cancer.  EXAM: ACUTE ABDOMEN SERIES (ABDOMEN 2 VIEW & CHEST 1 VIEW)  COMPARISON:  03/07/2013  FINDINGS: Gas pattern is within normal limits without evidence of ileus, obstruction or free air. No worrisome calcifications. Chronic degenerative change of the spine.  One-view chest shows power port on the right insert it from an internal jugular approach with the tip in the SVC at the azygos level. Heart and mediastinal shadows  are normal. The lungs are clear. No free air. Previous thyroidectomy.  IMPRESSION: Negative acute abdominal series.   Electronically Signed   By: Nelson Chimes M.D.   On: 05/02/2013 14:40    EKG: Independently reviewed. Sinus rhythm, T-wave inversion V1-V3 II, III, AVF    Time spent:70 minutes Code Status:   FULL Family Communication:   Family at bedside   Orson Eva, DO  [facility] Pager [phone]  If 7PM-7AM, please contact night-coverage [email or web address] Password [facility] 05/02/2013, 3:59 PM

## 2013-05-02 NOTE — ED Notes (Addendum)
Pt received chemo on Wednesday, Now feels weak, having diarrhea, numbness all over "my heart rate is up".   Nausea, no vomiting  Moaning intermittently, being tx for breast cancer

## 2013-05-02 NOTE — ED Provider Notes (Signed)
CSN: [id number]     Arrival date & time 05/02/13  1140 History  This chart was scribed for Ezequiel Essex, MD by Marcha Dutton, ED Scribe. This patient was seen in room APA19/APA19 and the patient's care was started at 1:11 PM.    Chief Complaint  Patient presents with  . Diarrhea      The history is provided by the patient and a relative. No language interpreter was used.  HPI Comments: [name] is a 58 y.o. female with a h/o HTN, HLD, thyroidectomywho presents to the Emergency Department complaining of diarrhea that began yesterday. Pt reports 6-7 episodes since yesterday. She reports associated light headedness, SOB and CP. Pt denies hematochezia, cough,  abdominal pain, and back pain. She reports she is on her fourth round of chemo for breast cancer and had a treatment 5 days ago. She reports these symptoms are normal for her post chemo but not as severe. Pt reports she hasn't been eaten since. She also reports her sugars have been high lately. She states Dr. Chancy Milroy is her Cancer doctor, and Dr. Wolfgang Phoenix is her PCP.  Past Medical History  Diagnosis Date  . Hyperlipidemia   . Obesity   . Asthma     prn neb. and inhaler  . Osteoarthritis 2003    left knee  . Non-insulin dependent type 2 diabetes mellitus   . Graves' disease   . Ophthalmic manifestation of Graves disease   . Hypertension     on multiple meds., has been on med. > 14 yr.  . Dehydration 04/13/2013  . Breast cancer 01/2013    right   Past Surgical History  Procedure Laterality Date  . Total knee arthroplasty Left 2003  . Total thyroidectomy    . Colonoscopy  2007  . Tubal ligation  1985  . Colonoscopy w/ polypectomy  2009  . Portacath placement Right 02/17/2013    Procedure: INSERTION PORT-A-CATH;  Surgeon: Joyice Faster. Cornett, MD;  Location: [place];  Service: General;  Laterality: Right;   Family History  Problem Relation Age of Onset  . Heart disease Father   . Lung cancer Father   .  Diabetes Mother    History  Substance Use Topics  . Smoking status: Former Smoker -- 20 years    Quit date: 02/10/1991  . Smokeless tobacco: Never Used  . Alcohol Use: No   OB History   Grav Para Term Preterm Abortions TAB SAB Ect Mult Living                 Review of Systems A complete 10 system review of systems was obtained and all systems are negative except as noted in the HPI and PMH.    Allergies  Procardia  Home Medications   Prior to Admission medications   Medication Sig Start Date End Date Taking? Authorizing Provider  albuterol (PROVENTIL HFA;VENTOLIN HFA) 108 (90 BASE) MCG/ACT inhaler Inhale 2 puffs into the lungs every 4 (four) hours as needed. 02/02/13  Yes Mikey Kirschner, MD  albuterol (PROVENTIL) (5 MG/ML) 0.5% nebulizer solution Take 2.5 mg by nebulization every 6 (six) hours as needed for wheezing or shortness of breath.   Yes Historical Provider, MD  amLODipine (NORVASC) 10 MG tablet TAKE ONE TABLET BY MOUTH ONCE DAILY. 03/14/13  Yes Mikey Kirschner, MD  Artificial Tear Ointment (ARTIFICIAL TEARS) ointment Place 1 drop into both eyes as needed (for grey eye disease).    Yes Historical Provider, MD  aspirin 81 MG tablet Take 81 mg by mouth daily.   Yes Historical Provider, MD  benazepril (LOTENSIN) 40 MG tablet TAKE ONE TABLET BY MOUTH ONCE DAILY. 04/14/13  Yes Mikey Kirschner, MD  cloNIDine (CATAPRES - DOSED IN MG/24 HR) 0.2 mg/24hr patch APPLY 1 PATCH TRANSDERMALLY ONCE A WEEK. 04/14/13  Yes Mikey Kirschner, MD  cycloSPORINE (RESTASIS) 0.05 % ophthalmic emulsion Place 1 drop into both eyes 2 (two) times daily.    Yes Historical Provider, MD  gabapentin (NEURONTIN) 100 MG capsule Take 1 capsule (100 mg total) by mouth 3 (three) times daily. 04/13/13  Yes Deatra Robinson, MD  hydrochlorothiazide (HYDRODIURIL) 25 MG tablet TAKE ONE TABLET BY MOUTH ONCE DAILY. 04/01/13  Yes Mikey Kirschner, MD  ibuprofen (ADVIL,MOTRIN) 200 MG tablet Take 400 mg by mouth every 8 (eight)  hours as needed for moderate pain.   Yes Historical Provider, MD  labetalol (NORMODYNE) 300 MG tablet TAKE (1) TABLET BY MOUTH TWICE DAILY. 01/24/13  Yes Mikey Kirschner, MD  LANTUS SOLOSTAR 100 UNIT/ML Solostar Pen Inject 7-18 Units into the skin daily. Per sliding scale. 02/21/13  Yes Historical Provider, MD  levothyroxine (SYNTHROID, LEVOTHROID) 100 MCG tablet Take 100 mcg by mouth every morning.   Yes Historical Provider, MD  metFORMIN (GLUCOPHAGE) 500 MG tablet TAKE (1) TABLET BY MOUTH TWICE A DAY WITH MEALS (BREAKFAST AND SUPPER). 04/22/13  Yes Mikey Kirschner, MD  omeprazole (PRILOSEC) 20 MG capsule Take 1 capsule (20 mg total) by mouth daily. 03/04/13  Yes Wilmon Arms, MD  ondansetron (ZOFRAN ODT) 4 MG disintegrating tablet Take 1 tablet (4 mg total) by mouth every 8 (eight) hours as needed for nausea. 03/19/13  Yes Johnna Acosta, MD  pravastatin (PRAVACHOL) 40 MG tablet Take 40 mg by mouth 2 (two) times daily at 8 am and 10 pm.   Yes Historical Provider, MD  prochlorperazine (COMPAZINE) 10 MG tablet Take 10 mg by mouth daily as needed for nausea or vomiting.  02/09/13  Yes Historical Provider, MD  vitamin B-12 (CYANOCOBALAMIN) 500 MCG tablet Take 500 mcg by mouth daily.   Yes Historical Provider, MD  ACCU-CHEK SMARTVIEW test strip USE ONE STRIP TO CHECK GLUCOSE 4 TIMES DAILY 10/01/12   Mikey Kirschner, MD  dexamethasone (DECADRON) 4 MG tablet Take 4 mg by mouth daily. Take 1 tablet the day before chemo and 3 days after chemo. 02/15/13   Historical Provider, MD   Triage Vitals: BP 145/91  Pulse 111  Temp(Src) 98.6 F (37 C) (Oral)  Resp 24  Ht 5' 4.5" (1.638 m)  Wt 258 lb (117.028 kg)  BMI 43.62 kg/m2  SpO2 100%  Physical Exam  Nursing note and vitals reviewed. Constitutional: She appears well-developed and well-nourished. No distress.  HENT:  Head: Normocephalic and atraumatic.  Right Ear: External ear normal.  Left Ear: External ear normal.  Mouth/Throat: Mucous membranes are dry.   Eyes: Conjunctivae are normal. Right eye exhibits no discharge. Left eye exhibits no discharge. No scleral icterus.  Neck: Neck supple. No tracheal deviation present.  Cardiovascular: Normal rate, regular rhythm and intact distal pulses.   Pulmonary/Chest: Effort normal and breath sounds normal. No stridor. No respiratory distress. She has no wheezes. She has no rales.  Abdominal: Soft. Bowel sounds are normal. She exhibits no distension. There is no tenderness. There is no rebound and no guarding.  Obese   Musculoskeletal: She exhibits no edema and no tenderness.  Neurological: She is alert. She has  normal strength. No cranial nerve deficit (no facial droop, extraocular movements intact, no slurred speech) or sensory deficit. She exhibits normal muscle tone. She displays no seizure activity. Coordination normal.  Skin: Skin is warm and dry. No rash noted.  Psychiatric: She has a normal mood and affect.    ED Course  Procedures (including critical care time)  DIAGNOSTIC STUDIES: Oxygen Saturation is 100% on RA, normal by my interpretation.    COORDINATION OF CARE: 1:15 PM- Pt advised of plan for treatment and pt agrees.   Labs Review Labs Reviewed  CBC WITH DIFFERENTIAL - Abnormal; Notable for the following:    WBC 3.9 (*)    RBC 3.27 (*)    Hemoglobin 9.6 (*)    HCT 27.8 (*)    RDW 20.7 (*)    Platelets 105 (*)    All other components within normal limits  COMPREHENSIVE METABOLIC PANEL - Abnormal; Notable for the following:    Potassium 2.7 (*)    Chloride 91 (*)    Glucose, Bld 160 (*)    Calcium 6.5 (*)    AST 53 (*)    Alkaline Phosphatase 120 (*)    GFR calc non Af Amer 62 (*)    GFR calc Af Amer 72 (*)    All other components within normal limits  BLOOD GAS, VENOUS - Abnormal; Notable for the following:    pH, Ven 7.492 (*)    pCO2, Ven 39.7 (*)    Bicarbonate 30.1 (*)    Acid-Base Excess 6.6 (*)    All other components within normal limits  I-STAT CG4 LACTIC  ACID, ED - Abnormal; Notable for the following:    Lactic Acid, Venous 3.53 (*)    All other components within normal limits  CLOSTRIDIUM DIFFICILE BY PCR  TROPONIN I  LIPASE, BLOOD  KETONES, QUALITATIVE  URINALYSIS, ROUTINE W REFLEX MICROSCOPIC    Imaging Review Dg Abd Acute W/chest  05/02/2013   CLINICAL DATA:  Nausea and diarrhea since yesterday. History of breast cancer.  EXAM: ACUTE ABDOMEN SERIES (ABDOMEN 2 VIEW & CHEST 1 VIEW)  COMPARISON:  03/07/2013  FINDINGS: Gas pattern is within normal limits without evidence of ileus, obstruction or free air. No worrisome calcifications. Chronic degenerative change of the spine.  One-view chest shows power port on the right insert it from an internal jugular approach with the tip in the SVC at the azygos level. Heart and mediastinal shadows are normal. The lungs are clear. No free air. Previous thyroidectomy.  IMPRESSION: Negative acute abdominal series.   Electronically Signed   By: Nelson Chimes M.D.   On: 05/02/2013 14:40     EKG Interpretation None      MDM   Final diagnoses:  Dehydration  Orthostatic hypotension   Generalized weakness with diarrhea since chemotherapy meds 4 days ago. Received IV fluids 3 times since chemotherapy infusion. Poor by mouth intake.  Orthostatics Positive for heart rate. Elevates to 121 with standing. Patient is hypokalemic. She's given IV fluids and IV antiemetics. Lipase elevated at 3.5. No abdominal pain or chest pain.  EKG shows new T-wave inversions inferiorly. Troponin is negative. Suspect orthostatic hypotension and dehydration from recent chemotherapy use. We'll check C. difficile studies. Patient is given IV fluids, antiemetics. She also has hypocalcemia, which may be causing her symptoms.  She will be admitted for ongoing hydration, cycling of troponins and symptomatic control.   I personally performed the services described in this documentation, which was scribed in my presence.  The  recorded information has been reviewed and is accurate.   Ezequiel Essex, MD 05/02/13 1650

## 2013-05-02 NOTE — ED Notes (Signed)
CRITICAL VALUE ALERT  Critical value received:  2.7 Potassium  Date of notification:  05/02/13  Time of notification:  1779  Critical value read back:yes  Nurse who received alert:  Gerda Diss   MD notified (1st page):    Time of first page:    MD notified (2nd page):  Time of second page:  Responding MD:  Dr. Wyvonnia Dusky  Time MD responded:  [phone]

## 2013-05-03 ENCOUNTER — Telehealth: Payer: Self-pay | Admitting: Oncology

## 2013-05-03 DIAGNOSIS — R0789 Other chest pain: Secondary | ICD-10-CM

## 2013-05-03 LAB — CBC
HCT: 23.8 % — ABNORMAL LOW (ref 36.0–46.0)
Hemoglobin: 8.1 g/dL — ABNORMAL LOW (ref 12.0–15.0)
MCH: 29 pg (ref 26.0–34.0)
MCHC: 34 g/dL (ref 30.0–36.0)
MCV: 85.3 fL (ref 78.0–100.0)
PLATELETS: 88 10*3/uL — AB (ref 150–400)
RBC: 2.79 MIL/uL — ABNORMAL LOW (ref 3.87–5.11)
RDW: 20.7 % — ABNORMAL HIGH (ref 11.5–15.5)
WBC: 2.5 10*3/uL — AB (ref 4.0–10.5)

## 2013-05-03 LAB — BASIC METABOLIC PANEL
BUN: 9 mg/dL (ref 6–23)
CO2: 31 meq/L (ref 19–32)
CREATININE: 1.03 mg/dL (ref 0.50–1.10)
Calcium: 5.8 mg/dL — CL (ref 8.4–10.5)
Chloride: 99 mEq/L (ref 96–112)
GFR calc non Af Amer: 59 mL/min — ABNORMAL LOW (ref >90)
GFR, EST AFRICAN AMERICAN: 68 mL/min — AB (ref >90)
Glucose, Bld: 162 mg/dL — ABNORMAL HIGH (ref 70–99)
Potassium: 3.3 mEq/L — ABNORMAL LOW (ref 3.7–5.3)
Sodium: 141 mEq/L (ref 137–147)

## 2013-05-03 LAB — GLUCOSE, CAPILLARY
GLUCOSE-CAPILLARY: 158 mg/dL — AB (ref 70–99)
GLUCOSE-CAPILLARY: 127 mg/dL — AB (ref 70–99)
Glucose-Capillary: 154 mg/dL — ABNORMAL HIGH (ref 70–99)
Glucose-Capillary: 149 mg/dL — ABNORMAL HIGH (ref 70–99)

## 2013-05-03 LAB — TROPONIN I

## 2013-05-03 LAB — HEMOGLOBIN A1C
HEMOGLOBIN A1C: 9.6 % — AB (ref <5.7)
MEAN PLASMA GLUCOSE: 229 mg/dL — AB (ref <117)

## 2013-05-03 LAB — CLOSTRIDIUM DIFFICILE BY PCR: Toxigenic C. Difficile by PCR: NEGATIVE

## 2013-05-03 LAB — TSH: TSH: 12.66 u[IU]/mL — ABNORMAL HIGH (ref 0.350–4.500)

## 2013-05-03 MED ORDER — LEVOTHYROXINE SODIUM 25 MCG PO TABS
125.0000 ug | ORAL_TABLET | Freq: Every day | ORAL | Status: DC
  Administered 2013-05-04: 125 ug via ORAL
  Filled 2013-05-03 (×2): qty 1

## 2013-05-03 MED ORDER — POTASSIUM CHLORIDE CRYS ER 10 MEQ PO TBCR
30.0000 meq | EXTENDED_RELEASE_TABLET | Freq: Once | ORAL | Status: AC
  Administered 2013-05-03: 30 meq via ORAL
  Filled 2013-05-03: qty 3

## 2013-05-03 MED ORDER — LOPERAMIDE HCL 2 MG PO CAPS
2.0000 mg | ORAL_CAPSULE | ORAL | Status: DC | PRN
  Administered 2013-05-03 – 2013-05-04 (×2): 2 mg via ORAL
  Filled 2013-05-03 (×2): qty 1

## 2013-05-03 MED ORDER — SODIUM CHLORIDE 0.9 % IV SOLN
1.0000 g | Freq: Once | INTRAVENOUS | Status: AC
  Administered 2013-05-03: 1 g via INTRAVENOUS
  Filled 2013-05-03: qty 10

## 2013-05-03 MED ORDER — CALCIUM CARBONATE ANTACID 500 MG PO CHEW
400.0000 mg | CHEWABLE_TABLET | Freq: Two times a day (BID) | ORAL | Status: DC
  Administered 2013-05-03 – 2013-05-04 (×2): 400 mg via ORAL
  Filled 2013-05-03 (×3): qty 1

## 2013-05-03 MED ORDER — DEXTROSE 5 % IV SOLN
INTRAVENOUS | Status: AC
  Filled 2013-05-03: qty 10

## 2013-05-03 MED ORDER — POTASSIUM CHLORIDE CRYS ER 20 MEQ PO TBCR
30.0000 meq | EXTENDED_RELEASE_TABLET | Freq: Once | ORAL | Status: AC
  Administered 2013-05-03: 30 meq via ORAL
  Filled 2013-05-03 (×2): qty 1

## 2013-05-03 MED ORDER — DEXTROSE 5 % IV SOLN
1.0000 g | INTRAVENOUS | Status: DC
  Administered 2013-05-03: 1 g via INTRAVENOUS
  Filled 2013-05-03 (×2): qty 10

## 2013-05-03 NOTE — Progress Notes (Signed)
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  The patient actually went back to the [place] at [facility] and received 1 L intravenous fluids on 04/29/2013 04/30/2013. The patient continued to have some dizziness and generalized weakness. She complains of some loose stools without any hematochezia or melena. The patient had a loose stools on 05/01/2013 and 4 loose stools today. She denies any dysuria or hematuria.. She denies any fevers, chills, vomiting, abdominal pain, syncope, headache, visual disturbance. 24 hours, the patient has complained of some chest discomfort with exertion as well as some dyspnea on exertion. She has had poor po intake. The patient had positive orthostatic vital signs. The patient was started on intravenous fluids with improvement of her symptoms.  The patient was noted to have pyuria and was started on ceftriaxone empirically. She was also found to have hypocalcemia which may be contributing to her generalized weakness.   Assessment/Plan: Orthostatic hypotension/dehydration  -Continue IV fluids-->clinically improving--no more dizziness -Discontinue HCTZ  Hypocalcemia  -Likely due to poor oral intake, diarrhea, although cannot rule out hypoparathyroidism given the patient's history of thyroidectomy  -Check vitamin D levels--1,25-vitamin D, and 25 vitamin D  -Replete  -The patient has had history of thyroidectomy  -ionized calcium--0.73 -Calcium gluconate was not given as ordered previously--regular today -Start calcium carbonate by mouth -Repeat BMP, ionized calcium in am Generalize weakness  -Likely related to dehydration and hypercalcemia    -Check TSH 12.660--> increase Synthroid to 125 mcg daily  -May also be related to UTI -Symptoms improving with IV fluids Pyuria -Start ceftriaxone -Await urine culture Abnormal EKG/Atypical chest pain  -Cycle troponins--neg x 3 -repeat EKG in am now she is fluid resuscitated -first EKG with ST depression II, III, AVF and V1-3 Lactic acidosis  -Likely due to hypoperfusion/volume depletion  Pancytopenia  -Likely due to chemotherapy  -Continue to monitor closely  Diabetes mellitus type 2  -Place on half home dose of Lantus  -NovoLog sliding scale  -Hemoglobin A1c--9.6 -d/c metformin  Invasive ductal carcinoma of the right breast  -04/27/2013--last dose of chemotherapy  -Dr. Humphrey Rolls is pt's oncologist  Hypokalemia  -Replete  -Check magnesium  diarrhea  -Likely related to the patient's chemotherapy  -Check C. difficile PCR--pending  Hypertension  -Continue labetalol  -Continue clonidine TTS 2 patch  -Continue benazepril -d/c amlodipine/HCTZ       Procedures/Studies: Dg Abd Acute W/chest  05/02/2013   CLINICAL DATA:  Nausea and diarrhea since yesterday. History of breast cancer.  EXAM: ACUTE ABDOMEN SERIES (ABDOMEN 2 VIEW & CHEST 1 VIEW)  COMPARISON:  03/07/2013  FINDINGS: Gas pattern is within normal limits without evidence of ileus, obstruction or free air. No worrisome calcifications. Chronic degenerative change of the spine.  One-view chest shows power port on the right insert it from an internal jugular approach with the tip in the SVC at the azygos level. Heart and mediastinal shadows are normal. The lungs are clear. No free air. Previous thyroidectomy.  IMPRESSION: Negative acute abdominal series.   Electronically Signed   By: Nelson Chimes M.D.   On: 05/02/2013 14:40         Subjective:   Objective: Filed Vitals:   05/02/13 1751 05/02/13 2333 05/03/13  0606 05/03/13 1408  BP: 144/64 106/52 126/62 108/61  Pulse: 86 94 90 88  Temp: 99 F (37.2 C) 99.3 F (37.4  C) 99.9 F (37.7 C) 98.7 F (37.1 C)  TempSrc: Oral Oral Other (Comment) Oral  Resp: 18 18 18 18   Height: 5' 4.5" (1.638 m)     Weight: 117.028 kg (258 lb)     SpO2: 99% 95% 97% 100%    Intake/Output Summary (Last 24 hours) at 05/03/13 1918 Last data filed at 05/03/13 1833  Gross per 24 hour  Intake   1670 ml  Output    550 ml  Net   1120 ml   Weight change:  Exam:   General:  Pt is alert, follows commands appropriately, not in acute distress  HEENT: No icterus, No thrush, No neck mass, [place]/AT  Cardiovascular: RRR, S1/S2, no rubs, no gallops  Respiratory: CTA bilaterally, no wheezing, no crackles, no rhonchi  Abdomen: Soft/+BS, non tender, non distended, no guarding  Extremities: No edema, No lymphangitis, No petechiae, No rashes, no synovitis  Data Reviewed: Basic Metabolic Panel:  Recent Labs Lab 04/27/13 0852 05/02/13 1245 05/03/13 0532  NA 145 139 141  K 3.2* 2.7* 3.3*  CL  --  91* 99  CO2 26 30 31   GLUCOSE 197* 160* 162*  BUN 7.5 13 9   CREATININE 1.0 0.98 1.03  CALCIUM 6.7* 6.5* 5.8*   Liver Function Tests:  Recent Labs Lab 04/27/13 0852 05/02/13 1245  AST 17 53*  ALT 8 30  ALKPHOS 100 120*  BILITOT 0.30 0.9  PROT 6.6 6.4  ALBUMIN 3.5 3.5    Recent Labs Lab 05/02/13 1245  LIPASE 47   No results found for this basename: AMMONIA,  in the last 168 hours CBC:  Recent Labs Lab 04/27/13 0852 05/02/13 1245 05/03/13 0532  WBC 6.3 3.9* 2.5*  NEUTROABS 4.3 2.2  --   HGB 8.7* 9.6* 8.1*  HCT 26.1* 27.8* 23.8*  MCV 85.0 85.0 85.3  PLT 154 105* 88*   Cardiac Enzymes:  Recent Labs Lab 05/02/13 1245 05/02/13 1837 05/03/13 0009  TROPONINI <0.30 <0.30 <0.30   BNP: No components found with this basename: POCBNP,  CBG:  Recent Labs Lab 05/02/13 2054 05/03/13 0727 05/03/13 1116 05/03/13 1613  GLUCAP 151* 154* 158* 149*    No results found for this or any previous visit (from the past 240 hour(s)).   Scheduled Meds: . aspirin  EC  81 mg Oral Daily  . benazepril  40 mg Oral Daily  . calcium gluconate  1 g Intravenous Once  . cloNIDine  0.2 mg Transdermal Weekly  . cycloSPORINE  1 drop Both Eyes BID  . enoxaparin (LOVENOX) injection  40 mg Subcutaneous Q24H  . gabapentin  100 mg Oral TID  . insulin aspart  0-5 Units Subcutaneous QHS  . insulin aspart  0-9 Units Subcutaneous TID WC  . insulin glargine  5 Units Subcutaneous Daily  . labetalol  300 mg Oral BID  . levothyroxine  100 mcg Oral QAC breakfast  . pantoprazole  40 mg Oral Daily  . simvastatin  20 mg Oral q1800  . sodium chloride  3 mL Intravenous Q12H  . vitamin B-12  500 mcg Oral Daily   Continuous Infusions: . 0.9 % NaCl with KCl 20 mEq / L 100 mL/hr at 05/03/13 Kingsley Plan, DO  [facility] Pager [phone]  If 7PM-7AM, please contact night-coverage [email or web address] Password [facility] 05/03/2013, 7:18 PM  LOS: 1 day

## 2013-05-03 NOTE — Care Management Utilization Note (Signed)
UR completed 

## 2013-05-03 NOTE — Care Management Note (Signed)
    Page 1 of 1   05/03/2013     1:53:00 PM CARE MANAGEMENT NOTE 05/03/2013  Patient:  [name], [name]   Account Number:  [id number]  Date Initiated:  05/03/2013  Documentation initiated by:  Claretha Cooper  Subjective/Objective Assessment:   Pt admitted from home where she lives with her daughters. One daughter is at bedside. Pt has a walker and BSC. No other needs identified or anticipated by pt.     Action/Plan:   Anticipated DC Date:     Anticipated DC Plan:  HOME/SELF CARE      DC Planning Services  CM consult      Choice offered to / List presented to:             Status of service:   Medicare Important Message given?   (If response is "NO", the following Medicare IM given date fields will be blank) Date Medicare IM given:   Date Additional Medicare IM given:    Discharge Disposition:    Per UR Regulation:    If discussed at Long Length of Stay Meetings, dates discussed:    Comments:  05/03/13 Claretha Cooper RN BSN CM

## 2013-05-03 NOTE — Telephone Encounter (Signed)
s/w pt earlier (see previous note) she is currently inpt @ [facility]. appt for tomorrow cxd and appts for 5/6 and 5/13 moved to [facility]. new schedule mailed and pt will also call us for next appt upon d/c.

## 2013-05-03 NOTE — Telephone Encounter (Signed)
kk out - moved 4/22 appt to [facility]. lmonvm phone for pt then called cell. s/w pt and she is currently an inpt @ [facility]. appt for 4/22 cxd and info given to [facility]. pt will call when she is d/c'd.

## 2013-05-04 ENCOUNTER — Telehealth: Payer: Self-pay | Admitting: *Deleted

## 2013-05-04 ENCOUNTER — Ambulatory Visit: Payer: Medicare Other | Admitting: Adult Health

## 2013-05-04 ENCOUNTER — Other Ambulatory Visit: Payer: Medicare Other

## 2013-05-04 DIAGNOSIS — E039 Hypothyroidism, unspecified: Secondary | ICD-10-CM

## 2013-05-04 DIAGNOSIS — C50419 Malignant neoplasm of upper-outer quadrant of unspecified female breast: Secondary | ICD-10-CM

## 2013-05-04 DIAGNOSIS — N390 Urinary tract infection, site not specified: Secondary | ICD-10-CM

## 2013-05-04 LAB — URINE CULTURE

## 2013-05-04 LAB — GLUCOSE, CAPILLARY
Glucose-Capillary: 126 mg/dL — ABNORMAL HIGH (ref 70–99)
Glucose-Capillary: 132 mg/dL — ABNORMAL HIGH (ref 70–99)
Glucose-Capillary: 100 mg/dL — ABNORMAL HIGH (ref 70–99)

## 2013-05-04 LAB — HEPATIC FUNCTION PANEL
ALK PHOS: 92 U/L (ref 39–117)
ALT: 40 U/L — ABNORMAL HIGH (ref 0–35)
AST: 41 U/L — ABNORMAL HIGH (ref 0–37)
Albumin: 3.1 g/dL — ABNORMAL LOW (ref 3.5–5.2)
BILIRUBIN TOTAL: 0.4 mg/dL (ref 0.3–1.2)
Bilirubin, Direct: <0.2 mg/dL (ref 0.0–0.3)
Total Protein: 5.6 g/dL — ABNORMAL LOW (ref 6.0–8.3)

## 2013-05-04 LAB — BASIC METABOLIC PANEL
BUN: 5 mg/dL — AB (ref 6–23)
CO2: 27 mEq/L (ref 19–32)
CREATININE: 1 mg/dL (ref 0.50–1.10)
Calcium: 6 mg/dL — CL (ref 8.4–10.5)
Chloride: 105 mEq/L (ref 96–112)
GFR calc Af Amer: 71 mL/min — ABNORMAL LOW (ref >90)
GFR, EST NON AFRICAN AMERICAN: 61 mL/min — AB (ref >90)
Glucose, Bld: 120 mg/dL — ABNORMAL HIGH (ref 70–99)
Potassium: 3.8 mEq/L (ref 3.7–5.3)
Sodium: 143 mEq/L (ref 137–147)

## 2013-05-04 LAB — MAGNESIUM: Magnesium: 0.7 mg/dL — CL (ref 1.5–2.5)

## 2013-05-04 LAB — CALCIUM, IONIZED: Calcium, Ion: 0.79 mmol/L — ABNORMAL LOW (ref 1.12–1.23)

## 2013-05-04 LAB — VITAMIN D 25 HYDROXY (VIT D DEFICIENCY, FRACTURES): Vit D, 25-Hydroxy: 13 ng/mL — ABNORMAL LOW (ref 30–89)

## 2013-05-04 MED ORDER — CALCIUM CARBONATE ANTACID 500 MG PO CHEW: 400.0000 mg | CHEWABLE_TABLET | Freq: Two times a day (BID) | ORAL | Status: DC

## 2013-05-04 MED ORDER — HEPARIN SOD (PORK) LOCK FLUSH 100 UNIT/ML IV SOLN
500.0000 [IU] | INTRAVENOUS | Status: AC | PRN
  Administered 2013-05-04: 500 [IU]
  Filled 2013-05-04: qty 5

## 2013-05-04 MED ORDER — CIPROFLOXACIN HCL 250 MG PO TABS: 250.0000 mg | ORAL_TABLET | Freq: Two times a day (BID) | ORAL | Status: DC

## 2013-05-04 MED ORDER — LEVOTHYROXINE SODIUM 125 MCG PO TABS
125.0000 ug | ORAL_TABLET | Freq: Every day | ORAL | Status: DC
Start: 2013-05-04 — End: 2013-11-15

## 2013-05-04 MED ORDER — SODIUM CHLORIDE 0.9 % IV SOLN
1.0000 g | Freq: Once | INTRAVENOUS | Status: AC
  Administered 2013-05-04: 1 g via INTRAVENOUS
  Filled 2013-05-04: qty 10

## 2013-05-04 NOTE — Discharge Summary (Signed)
Physician Discharge Summary  [name] GUR:[id number] DOB: 05-28-1955 DOA: 05/02/2013  PCP: Rubbie Battiest, MD  Admit date: 05/02/2013 Discharge date: 05/04/2013  Time spent: 45 minutes  Recommendations for Outpatient Follow-up:  -Will be discharged home today. -Advised to follow up with PCP in 2 days at which time calcium level should be rechecked and results of urine cx should be followed to make sure cipro was an appropriate abx choice. -Recheck TSH in 4-6 weeks.  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 Per history, the patient has a propensity for becoming dehydrated after chemotherapy. The patient actually went back to the [place] at [facility] and received 1 L intravenous fluids on 04/29/2013 04/30/2013. The patient continued to have some dizziness and generalized weakness. She complains of some loose stools without any hematochezia or melena. The patient had a loose stools on 05/01/2013 and 4 loose stools today. She denies any dysuria or hematuria.. She denies any fevers, chills, vomiting, abdominal pain, syncope, headache, visual disturbance. 24 hours, the patient has complained of some chest discomfort with exertion as well as some dyspnea on exertion. She has had poor po intake.  In the ED, the patient was noted to have positive orthostatic vital signs. BMP was noted to show potassium 2.7. Hepatic enzymes showed mild elevation of AST 53  otherwise were unremarkable. Lipase was 47. The patient's CBC revealed to be dizzy 3.9 and platelets to 105,000. Lactic acid was 2.53. Serum acetone was negative. Hemoglobin was stable at 9.6. EKG showed sinus rhythm with T-wave inversion in V1 to V3 as well as T wave inversion II, III, AVF. The patient was given 1 L normal saline. Acute abdominal series was negative. Hospitalist admission was requested.   Hospital Course:   Orthostatic Hypotension/Dehydration -Resolved with IVF. -Has been ambulating without dizziness. -DC HCTZ for now.  Hypocalcemia -Likely 2/2 poor PO intake, diarrhea and possibly hypoparathyroidism with h/o thyroidectomy in the past. -Corrected Calcium 6.4 today. -Was given 1gr ca gluconate. -Continue PO ca carbonate. -She wants to go home today. -Have advised she follow up with PCP in 2 days to have levels rechecked.  UTI -Cx with >100,000 GNR. -Will Dc on cipro for 5 days to complete a 7 day course of abx. -At time of hospital F/u will need to follow urine cx.  Hypothyroidism -TSH >12. -Synthroid increased from 100 to 125 mcg. -Should have her TSH rechecked in 4 -6 weeks.  Atypical Chest Pain -Resolved. -Likely related to hypocalcemia. -Nofurther work up at this point is deemed necessary.   Pancytopenia -2/2 chemotherapy. -Stable counts.  DM 2 -Continue lantus.  Breast Cancer -Follow up with Dr. Humphrey Rolls as an OP.  Diarrhea -Resolved. -C Diff PCR negative.     Procedures:  None   Consultations:  None  Discharge Instructions      Discharge Orders   Future Appointments Provider Department Dept Phone   05/05/2013 10:00 AM Mikey Kirschner, MD [place] [phone]  05/18/2013 10:45 AM [facility] [phone]   05/18/2013 11:15 AM Minette Headland, NP [facility] [phone]   05/18/2013 1:30 PM [facility] [phone]   05/19/2013 9:00 AM [facility] Nurse [facility] [phone]   05/25/2013 8:15 AM [facility] [place] [facility] [phone]   05/25/2013 8:45 AM Minette Headland, NP [place] [phone]   Future Orders Complete By Expires   Diet - low sodium heart healthy  As directed    Discontinue IV  As directed    Increase activity slowly  As directed        Medication List    STOP taking these medications       dexamethasone 4 MG tablet  Commonly known as:  DECADRON     hydrochlorothiazide 25 MG tablet  Commonly known as:  HYDRODIURIL      TAKE these medications       ACCU-CHEK SMARTVIEW test strip  Generic drug:  glucose blood  USE ONE STRIP TO CHECK GLUCOSE 4 TIMES DAILY     albuterol (5 MG/ML) 0.5% nebulizer solution  Commonly known as:  PROVENTIL  Take 2.5 mg by nebulization every 6 (six) hours as needed for wheezing or shortness of breath.     albuterol 108 (90 BASE) MCG/ACT inhaler  Commonly known as:  PROVENTIL HFA;VENTOLIN HFA  Inhale 2 puffs into the lungs every 4 (four) hours as needed.     amLODipine 10 MG tablet  Commonly known as:  NORVASC  TAKE ONE TABLET BY MOUTH ONCE DAILY.     artificial tears ointment  Place 1 drop into both eyes as needed (for grey eye disease).     aspirin 81 MG tablet  Take 81 mg by mouth daily.     benazepril 40 MG tablet  Commonly known as:  LOTENSIN  TAKE ONE TABLET BY MOUTH ONCE DAILY.     calcium carbonate 500 MG chewable tablet  Commonly known as:  TUMS - dosed in mg elemental calcium  Chew 2 tablets (400 mg of elemental calcium total) by mouth 2 (two) times daily.     ciprofloxacin 250 MG tablet  Commonly known as:  CIPRO  Take 1 tablet (250 mg total) by mouth 2 (two) times daily. For 5 days     cloNIDine 0.2 mg/24hr patch  Commonly known as:  CATAPRES - Dosed in mg/24 hr  APPLY 1 PATCH  TRANSDERMALLY ONCE A WEEK.     cycloSPORINE 0.05 % ophthalmic emulsion  Commonly known as:  RESTASIS  Place 1 drop into both eyes 2 (two) times daily.     gabapentin 100 MG capsule  Commonly known as:  NEURONTIN  Take 1 capsule (100 mg total) by mouth 3 (three) times daily.     ibuprofen 200 MG tablet  Commonly known as:  ADVIL,MOTRIN  Take 400 mg by mouth every 8 (eight) hours as needed for moderate pain.     labetalol 300 MG tablet  Commonly known as:  NORMODYNE  TAKE (1) TABLET BY MOUTH TWICE DAILY.     LANTUS SOLOSTAR 100 UNIT/ML Solostar Pen  Generic drug:  Insulin Glargine  Inject 7-18 Units into the skin daily. Per sliding scale.     levothyroxine 125 MCG tablet  Commonly known as:  SYNTHROID, LEVOTHROID  Take 1 tablet (125 mcg total) by mouth  daily before breakfast.     metFORMIN 500 MG tablet  Commonly known as:  GLUCOPHAGE  TAKE (1) TABLET BY MOUTH TWICE A DAY WITH MEALS (BREAKFAST AND SUPPER).     omeprazole 20 MG capsule  Commonly known as:  PRILOSEC  Take 1 capsule (20 mg total) by mouth daily.     ondansetron 4 MG disintegrating tablet  Commonly known as:  ZOFRAN ODT  Take 1 tablet (4 mg total) by mouth every 8 (eight) hours as needed for nausea.     pravastatin 40 MG tablet  Commonly known as:  PRAVACHOL  Take 40 mg by mouth 2 (two) times daily at 8 am and 10 pm.     prochlorperazine 10 MG tablet  Commonly known as:  COMPAZINE  Take 10 mg by mouth daily as needed for nausea or vomiting.     vitamin B-12 500 MCG tablet  Commonly known as:  CYANOCOBALAMIN  Take 500 mcg by mouth daily.       Allergies  Allergen Reactions  . Procardia [Nifedipine] Other (See Comments)    MIGRAINES   Follow-up Information   Follow up with Rubbie Battiest, MD. Schedule an appointment as soon as possible for a visit in 2 days.   Specialty:  Family Medicine   Contact information:   [address] [place] [address] [phone]        The results of  significant diagnostics from this hospitalization (including imaging, microbiology, ancillary and laboratory) are listed below for reference.    Significant Diagnostic Studies: Dg Abd Acute W/chest  05/02/2013   CLINICAL DATA:  Nausea and diarrhea since yesterday. History of breast cancer.  EXAM: ACUTE ABDOMEN SERIES (ABDOMEN 2 VIEW & CHEST 1 VIEW)  COMPARISON:  03/07/2013  FINDINGS: Gas pattern is within normal limits without evidence of ileus, obstruction or free air. No worrisome calcifications. Chronic degenerative change of the spine.  One-view chest shows power port on the right insert it from an internal jugular approach with the tip in the SVC at the azygos level. Heart and mediastinal shadows are normal. The lungs are clear. No free air. Previous thyroidectomy.  IMPRESSION: Negative acute abdominal series.   Electronically Signed   By: Nelson Chimes M.D.   On: 05/02/2013 14:40    Microbiology: Recent Results (from the past 240 hour(s))  URINE CULTURE     Status: None   Collection Time    05/02/13  7:11 PM      Result Value Ref Range Status   Specimen Description URINE, CLEAN CATCH   Final   Special Requests NONE   Final   Culture  Setup Time     Final   Value: 05/02/2013 23:35     Performed at [facility]     Final   Value: >=100,000 COLONIES/ML     Performed at [facility]   Culture     Final   Value: [place]     Performed at [facility]   Report Status PENDING   Incomplete  CLOSTRIDIUM DIFFICILE BY PCR     Status: None   Collection Time    05/03/13  7:45 PM      Result Value Ref Range Status   C difficile by pcr NEGATIVE  NEGATIVE Final     Labs: Basic Metabolic Panel:  Recent Labs Lab 05/02/13 1245 05/03/13 0532 05/04/13 0559  NA 139 141 143  K 2.7* 3.3* 3.8  CL 91* 99 105  CO2 30 31 27   GLUCOSE 160* 162* 120*  BUN 13 9 5*  CREATININE 0.98 1.03 1.00  CALCIUM 6.5* 5.8* 6.0*  MG  --   --  0.7*   Liver  Function Tests:  Recent Labs Lab 05/02/13 1245 05/04/13 0559  AST 53* 41*  ALT 30 40*  ALKPHOS 120* 92  BILITOT 0.9 0.4  PROT 6.4 5.6*  ALBUMIN 3.5 3.1*    Recent Labs Lab 05/02/13 1245  LIPASE 47   No results found for this basename: AMMONIA,  in the last 168 hours CBC:  Recent Labs Lab 05/02/13 1245 05/03/13 0532  WBC 3.9* 2.5*  NEUTROABS 2.2  --   HGB 9.6* 8.1*  HCT 27.8* 23.8*  MCV 85.0 85.3  PLT 105* 88*   Cardiac Enzymes:  Recent Labs Lab 05/02/13 1245 05/02/13 1837 05/03/13 0009  TROPONINI <0.30 <0.30 <0.30   BNP: BNP (last 3 results) No results found for this basename: PROBNP,  in the last 8760 hours CBG:  Recent Labs Lab 05/03/13 1116 05/03/13 1613 05/03/13 2152 05/04/13 0723 05/04/13 1118  GLUCAP 158* 149* 127* 126* 132*       Signed:  Erline Hau  [facility] Pager: [phone] 05/04/2013, 3:09 PM

## 2013-05-04 NOTE — Telephone Encounter (Signed)
Message copied by Amelia Jo I on Wed May 04, 2013  4:00 PM ------      Message from: Minette Headland      Created: Wed May 04, 2013  1:21 AM       Please call patient.  Her potassium was 3.2 on 4/15.  Please see if she will come in sometime this week for a lab only to recheck a bmet.  If so, please put in POF and order.            Thanks,       LC       ----- Message -----         From: [facility] Interface         Sent: 04/27/2013   9:05 AM           To: Deatra Robinson, MD                   ------

## 2013-05-04 NOTE — Telephone Encounter (Signed)
Did not speak with patient due to her being inpatient at [facility]. Potassium and Magnesium levels being checked in hospital.

## 2013-05-04 NOTE — Progress Notes (Signed)
Discharge instructions given, verbalized understanding, out in stable condition via w/c with staff. 

## 2013-05-05 ENCOUNTER — Ambulatory Visit (INDEPENDENT_AMBULATORY_CARE_PROVIDER_SITE_OTHER): Payer: Medicare Other | Admitting: Family Medicine

## 2013-05-05 ENCOUNTER — Encounter: Payer: Self-pay | Admitting: Family Medicine

## 2013-05-05 VITALS — BP 144/94 | Ht 64.5 in | Wt 254.0 lb

## 2013-05-05 DIAGNOSIS — E876 Hypokalemia: Secondary | ICD-10-CM

## 2013-05-05 DIAGNOSIS — N289 Disorder of kidney and ureter, unspecified: Secondary | ICD-10-CM

## 2013-05-05 DIAGNOSIS — E58 Dietary calcium deficiency: Secondary | ICD-10-CM

## 2013-05-05 MED ORDER — KETOCONAZOLE 2 % EX CREA: 1.0000 "application " | TOPICAL_CREAM | Freq: Two times a day (BID) | CUTANEOUS | Status: DC

## 2013-05-05 NOTE — Progress Notes (Signed)
   Subjective:    Patient ID: [name], female    DOB: 1955/03/15, 58 y.o.   MRN: [id number]  HPI  Patient is here today for a f/u from the hospital.   She was there for 3 days d/t dehydration from diarrhea r/t the chemotherapy.   They told her calcium was low from the diarrhea.   Pt feels better now. She is taking Imodium now, so the diarrhea has slowed down.  Overall patient's energy level is now better. She also had an aberration in her renal function tests.  Patient reports generally her sugars are in decent control. Due to see the endocrinologist later this month.  Compliant with her new medications. Of note they changed her thyroid dosage Romie Minus.  Patient also has rash pruritic in nature and it intertrigo location.   Review of Systems No headache no chest pain no back pain no abdominal pain no change in bowel habits    Objective:   Physical Exam  Alert no acute distress talkative. H&T normal. Vital stable. Lungs clear. Heart regular in rhythm. Abdomen benign. Intertrigo rash noted.      Assessment & Plan:  Impression 1 status post hospitalization for dehydration and elevated potassium and aberration in renal function tests. #2 diabetes decent control. #3 intertrigo rash likely is compliant plan econazole cream. Medications discussed. Appropriate blood work. Further recommendations based on results. Be sure to followup with thyroid and diabetes specialist. Also noted patient's thyroid dose was change in the hospital due 2 abnormal TSH.

## 2013-05-06 LAB — BASIC METABOLIC PANEL
BUN: 6 mg/dL (ref 6–23)
CO2: 21 meq/L (ref 19–32)
Calcium: 6.8 mg/dL — ABNORMAL LOW (ref 8.4–10.5)
Chloride: 98 mEq/L (ref 96–112)
Creat: 1.28 mg/dL — ABNORMAL HIGH (ref 0.50–1.10)
GLUCOSE: 185 mg/dL — AB (ref 70–99)
POTASSIUM: 3.9 meq/L (ref 3.5–5.3)
Sodium: 140 mEq/L (ref 135–145)

## 2013-05-08 ENCOUNTER — Inpatient Hospital Stay (HOSPITAL_COMMUNITY)
Admission: EM | Admit: 2013-05-08 | Discharge: 2013-05-11 | DRG: 682 | Disposition: A | Discharge status: Home / Self Care | Payer: Medicare Other | Attending: Internal Medicine | Admitting: Internal Medicine

## 2013-05-08 ENCOUNTER — Encounter (HOSPITAL_COMMUNITY): Payer: Self-pay | Admitting: Emergency Medicine

## 2013-05-08 DIAGNOSIS — Z791 Long term (current) use of non-steroidal anti-inflammatories (NSAID): Secondary | ICD-10-CM

## 2013-05-08 DIAGNOSIS — Z7982 Long term (current) use of aspirin: Secondary | ICD-10-CM

## 2013-05-08 DIAGNOSIS — T451X5A Adverse effect of antineoplastic and immunosuppressive drugs, initial encounter: Secondary | ICD-10-CM | POA: Diagnosis present

## 2013-05-08 DIAGNOSIS — I10 Essential (primary) hypertension: Secondary | ICD-10-CM

## 2013-05-08 DIAGNOSIS — D509 Iron deficiency anemia, unspecified: Secondary | ICD-10-CM

## 2013-05-08 DIAGNOSIS — E039 Hypothyroidism, unspecified: Secondary | ICD-10-CM

## 2013-05-08 DIAGNOSIS — I129 Hypertensive chronic kidney disease with stage 1 through stage 4 chronic kidney disease, or unspecified chronic kidney disease: Secondary | ICD-10-CM | POA: Diagnosis present

## 2013-05-08 DIAGNOSIS — E860 Dehydration: Secondary | ICD-10-CM

## 2013-05-08 DIAGNOSIS — Z87891 Personal history of nicotine dependence: Secondary | ICD-10-CM

## 2013-05-08 DIAGNOSIS — M1710 Unilateral primary osteoarthritis, unspecified knee: Secondary | ICD-10-CM | POA: Diagnosis present

## 2013-05-08 DIAGNOSIS — Z6841 Body Mass Index (BMI) 40.0 and over, adult: Secondary | ICD-10-CM

## 2013-05-08 DIAGNOSIS — E559 Vitamin D deficiency, unspecified: Secondary | ICD-10-CM | POA: Diagnosis present

## 2013-05-08 DIAGNOSIS — I519 Heart disease, unspecified: Secondary | ICD-10-CM | POA: Diagnosis present

## 2013-05-08 DIAGNOSIS — Z888 Allergy status to other drugs, medicaments and biological substances status: Secondary | ICD-10-CM

## 2013-05-08 DIAGNOSIS — E669 Obesity, unspecified: Secondary | ICD-10-CM

## 2013-05-08 DIAGNOSIS — R197 Diarrhea, unspecified: Secondary | ICD-10-CM

## 2013-05-08 DIAGNOSIS — E440 Moderate protein-calorie malnutrition: Secondary | ICD-10-CM

## 2013-05-08 DIAGNOSIS — E871 Hypo-osmolality and hyponatremia: Secondary | ICD-10-CM

## 2013-05-08 DIAGNOSIS — N390 Urinary tract infection, site not specified: Secondary | ICD-10-CM

## 2013-05-08 DIAGNOSIS — D6959 Other secondary thrombocytopenia: Secondary | ICD-10-CM | POA: Diagnosis present

## 2013-05-08 DIAGNOSIS — C50911 Malignant neoplasm of unspecified site of right female breast: Secondary | ICD-10-CM

## 2013-05-08 DIAGNOSIS — E876 Hypokalemia: Secondary | ICD-10-CM | POA: Diagnosis present

## 2013-05-08 DIAGNOSIS — J45909 Unspecified asthma, uncomplicated: Secondary | ICD-10-CM | POA: Diagnosis present

## 2013-05-08 DIAGNOSIS — E1169 Type 2 diabetes mellitus with other specified complication: Secondary | ICD-10-CM | POA: Diagnosis present

## 2013-05-08 DIAGNOSIS — N182 Chronic kidney disease, stage 2 (mild): Secondary | ICD-10-CM

## 2013-05-08 DIAGNOSIS — F17201 Nicotine dependence, unspecified, in remission: Secondary | ICD-10-CM

## 2013-05-08 DIAGNOSIS — N189 Chronic kidney disease, unspecified: Secondary | ICD-10-CM | POA: Diagnosis present

## 2013-05-08 DIAGNOSIS — E0590 Thyrotoxicosis, unspecified without thyrotoxic crisis or storm: Secondary | ICD-10-CM

## 2013-05-08 DIAGNOSIS — Z79899 Other long term (current) drug therapy: Secondary | ICD-10-CM

## 2013-05-08 DIAGNOSIS — R0789 Other chest pain: Secondary | ICD-10-CM

## 2013-05-08 DIAGNOSIS — Z96659 Presence of unspecified artificial knee joint: Secondary | ICD-10-CM

## 2013-05-08 DIAGNOSIS — Z833 Family history of diabetes mellitus: Secondary | ICD-10-CM

## 2013-05-08 DIAGNOSIS — E119 Type 2 diabetes mellitus without complications: Secondary | ICD-10-CM

## 2013-05-08 DIAGNOSIS — Z801 Family history of malignant neoplasm of trachea, bronchus and lung: Secondary | ICD-10-CM

## 2013-05-08 DIAGNOSIS — N179 Acute kidney failure, unspecified: Principal | ICD-10-CM

## 2013-05-08 DIAGNOSIS — E0500 Thyrotoxicosis with diffuse goiter without thyrotoxic crisis or storm: Secondary | ICD-10-CM | POA: Diagnosis present

## 2013-05-08 DIAGNOSIS — C50411 Malignant neoplasm of upper-outer quadrant of right female breast: Secondary | ICD-10-CM

## 2013-05-08 DIAGNOSIS — Z8249 Family history of ischemic heart disease and other diseases of the circulatory system: Secondary | ICD-10-CM

## 2013-05-08 DIAGNOSIS — E785 Hyperlipidemia, unspecified: Secondary | ICD-10-CM

## 2013-05-08 DIAGNOSIS — D61810 Antineoplastic chemotherapy induced pancytopenia: Secondary | ICD-10-CM | POA: Diagnosis present

## 2013-05-08 DIAGNOSIS — D630 Anemia in neoplastic disease: Secondary | ICD-10-CM | POA: Diagnosis present

## 2013-05-08 DIAGNOSIS — N19 Unspecified kidney failure: Secondary | ICD-10-CM

## 2013-05-08 DIAGNOSIS — M1990 Unspecified osteoarthritis, unspecified site: Secondary | ICD-10-CM

## 2013-05-08 DIAGNOSIS — C50919 Malignant neoplasm of unspecified site of unspecified female breast: Secondary | ICD-10-CM | POA: Diagnosis present

## 2013-05-08 LAB — CBC
HEMATOCRIT: 25.5 % — AB (ref 36.0–46.0)
Hemoglobin: 8.9 g/dL — ABNORMAL LOW (ref 12.0–15.0)
MCH: 29 pg (ref 26.0–34.0)
MCHC: 34.9 g/dL (ref 30.0–36.0)
MCV: 83.1 fL (ref 78.0–100.0)
Platelets: 158 10*3/uL (ref 150–400)
RBC: 3.07 MIL/uL — ABNORMAL LOW (ref 3.87–5.11)
RDW: 21 % — AB (ref 11.5–15.5)
WBC: 6 10*3/uL (ref 4.0–10.5)

## 2013-05-08 LAB — COMPREHENSIVE METABOLIC PANEL
ALT: 23 U/L (ref 0–35)
AST: 23 U/L (ref 0–37)
Albumin: 3.9 g/dL (ref 3.5–5.2)
Alkaline Phosphatase: 115 U/L (ref 39–117)
BUN: 9 mg/dL (ref 6–23)
CO2: 19 mEq/L (ref 19–32)
CREATININE: 3.33 mg/dL — AB (ref 0.50–1.10)
Calcium: 6.7 mg/dL — ABNORMAL LOW (ref 8.4–10.5)
Chloride: 94 mEq/L — ABNORMAL LOW (ref 96–112)
GFR calc Af Amer: 16 mL/min — ABNORMAL LOW (ref >90)
GFR calc non Af Amer: 14 mL/min — ABNORMAL LOW (ref >90)
Glucose, Bld: 165 mg/dL — ABNORMAL HIGH (ref 70–99)
Potassium: 3.9 mEq/L (ref 3.7–5.3)
Sodium: 133 mEq/L — ABNORMAL LOW (ref 137–147)
TOTAL PROTEIN: 6.7 g/dL (ref 6.0–8.3)
Total Bilirubin: 0.4 mg/dL (ref 0.3–1.2)

## 2013-05-08 LAB — GLUCOSE, CAPILLARY
Glucose-Capillary: 133 mg/dL — ABNORMAL HIGH (ref 70–99)
Glucose-Capillary: 117 mg/dL — ABNORMAL HIGH (ref 70–99)

## 2013-05-08 LAB — VITAMIN D 1,25 DIHYDROXY
VITAMIN D3 1, 25 (OH): 46 pg/mL
Vitamin D 1, 25 (OH)2 Total: 46 pg/mL (ref 18–72)

## 2013-05-08 LAB — CBG MONITORING, ED: Glucose-Capillary: 129 mg/dL — ABNORMAL HIGH (ref 70–99)

## 2013-05-08 MED ORDER — INSULIN ASPART 100 UNIT/ML ~~LOC~~ SOLN
0.0000 [IU] | Freq: Three times a day (TID) | SUBCUTANEOUS | Status: DC
  Administered 2013-05-08 – 2013-05-10 (×3): 1 [IU] via SUBCUTANEOUS
  Administered 2013-05-10 – 2013-05-11 (×2): 2 [IU] via SUBCUTANEOUS

## 2013-05-08 MED ORDER — CIPROFLOXACIN HCL 250 MG PO TABS
250.0000 mg | ORAL_TABLET | Freq: Two times a day (BID) | ORAL | Status: AC
  Administered 2013-05-08 – 2013-05-10 (×4): 250 mg via ORAL
  Filled 2013-05-08 (×5): qty 1

## 2013-05-08 MED ORDER — SODIUM CHLORIDE 0.9 % IV SOLN
1000.0000 mL | Freq: Once | INTRAVENOUS | Status: AC
  Administered 2013-05-08: 1000 mL via INTRAVENOUS

## 2013-05-08 MED ORDER — SODIUM CHLORIDE 0.9 % IV SOLN
1000.0000 mL | INTRAVENOUS | Status: DC
Start: 2013-05-08 — End: 2013-05-08

## 2013-05-08 MED ORDER — SIMVASTATIN 20 MG PO TABS
20.0000 mg | ORAL_TABLET | Freq: Every day | ORAL | Status: DC
  Administered 2013-05-08 – 2013-05-10 (×3): 20 mg via ORAL
  Filled 2013-05-08 (×4): qty 1

## 2013-05-08 MED ORDER — CYANOCOBALAMIN 500 MCG PO TABS
500.0000 ug | ORAL_TABLET | Freq: Every day | ORAL | Status: DC
  Administered 2013-05-08 – 2013-05-11 (×4): 500 ug via ORAL
  Filled 2013-05-08 (×4): qty 1

## 2013-05-08 MED ORDER — SODIUM CHLORIDE 0.9 % IV SOLN
1.0000 g | Freq: Once | INTRAVENOUS | Status: AC
  Administered 2013-05-08: 1 g via INTRAVENOUS
  Filled 2013-05-08: qty 10

## 2013-05-08 MED ORDER — GABAPENTIN 100 MG PO CAPS
100.0000 mg | ORAL_CAPSULE | Freq: Three times a day (TID) | ORAL | Status: DC
  Administered 2013-05-08 – 2013-05-11 (×9): 100 mg via ORAL
  Filled 2013-05-08 (×11): qty 1

## 2013-05-08 MED ORDER — CHOLECALCIFEROL 10 MCG (400 UNIT) PO TABS
800.0000 [IU] | ORAL_TABLET | Freq: Every day | ORAL | Status: DC
  Administered 2013-05-08 – 2013-05-09 (×2): 800 [IU] via ORAL
  Filled 2013-05-08 (×3): qty 2

## 2013-05-08 MED ORDER — CLONIDINE HCL 0.2 MG/24HR TD PTWK
0.2000 mg | MEDICATED_PATCH | TRANSDERMAL | Status: DC
  Administered 2013-05-08: 0.2 mg via TRANSDERMAL
  Filled 2013-05-08: qty 1

## 2013-05-08 MED ORDER — ALBUTEROL SULFATE (2.5 MG/3ML) 0.083% IN NEBU: 2.5000 mg | INHALATION_SOLUTION | Freq: Four times a day (QID) | RESPIRATORY_TRACT | Status: DC | PRN

## 2013-05-08 MED ORDER — LEVOTHYROXINE SODIUM 125 MCG PO TABS
125.0000 ug | ORAL_TABLET | Freq: Every day | ORAL | Status: DC
  Administered 2013-05-09 – 2013-05-11 (×3): 125 ug via ORAL
  Filled 2013-05-08 (×5): qty 1

## 2013-05-08 MED ORDER — PROCHLORPERAZINE MALEATE 10 MG PO TABS: 10.0000 mg | ORAL_TABLET | Freq: Every day | ORAL | Status: DC | PRN

## 2013-05-08 MED ORDER — ONDANSETRON HCL 4 MG/2ML IJ SOLN: 4.0000 mg | Freq: Four times a day (QID) | INTRAMUSCULAR | Status: DC | PRN

## 2013-05-08 MED ORDER — PANTOPRAZOLE SODIUM 40 MG PO TBEC
40.0000 mg | DELAYED_RELEASE_TABLET | Freq: Every day | ORAL | Status: DC
  Administered 2013-05-08 – 2013-05-11 (×4): 40 mg via ORAL
  Filled 2013-05-08 (×4): qty 1

## 2013-05-08 MED ORDER — SODIUM CHLORIDE 0.9 % IV SOLN: 1000.0000 mL | Freq: Once | INTRAVENOUS | Status: DC

## 2013-05-08 MED ORDER — VITAMIN D3 25 MCG (1000 UNIT) PO TABS
1000.0000 [IU] | ORAL_TABLET | Freq: Every day | ORAL | Status: DC
  Administered 2013-05-08 – 2013-05-09 (×2): 1000 [IU] via ORAL
  Filled 2013-05-08 (×3): qty 1

## 2013-05-08 MED ORDER — SODIUM CHLORIDE 0.9 % IV SOLN
INTRAVENOUS | Status: DC
  Administered 2013-05-08 (×2): via INTRAVENOUS
  Administered 2013-05-09: 125 mL/h via INTRAVENOUS

## 2013-05-08 MED ORDER — ONDANSETRON HCL 4 MG PO TABS: 4.0000 mg | ORAL_TABLET | Freq: Four times a day (QID) | ORAL | Status: DC | PRN

## 2013-05-08 MED ORDER — CYCLOSPORINE 0.05 % OP EMUL
1.0000 [drp] | Freq: Two times a day (BID) | OPHTHALMIC | Status: DC
  Administered 2013-05-08: via OPHTHALMIC
  Administered 2013-05-08 – 2013-05-11 (×6): 1 [drp] via OPHTHALMIC
  Filled 2013-05-08 (×10): qty 1

## 2013-05-08 MED ORDER — SODIUM CHLORIDE 0.9 % IV SOLN: 1000.0000 mL | INTRAVENOUS | Status: DC

## 2013-05-08 NOTE — ED Notes (Signed)
Pt coming from home with c/o diarrhea that has been going on for about a week and a half. Pt has breast cancer and is currently on chemo. Pt sts she usually gets diarrhea with chemo, but it doesn't last this long. Pt denies nausea and vomiting.Pt was recently (Wednesday) discharged from [facility] where she was admitted for same complaint.

## 2013-05-08 NOTE — H&P (Signed)
History and Physical    [name] FBP:[id number] DOB: 11/18/1955 DOA: 05/08/2013  Referring physician: Dr. Venora Maples PCP: Rubbie Battiest, MD  Specialists: none  Chief Complaint: weakness  HPI: [name] is a 58 y.o. female has a past medical history significant for invasive ductal carcinoma of the breast followed by Dr. Humphrey Rolls from oncology, currently undergoing chemotherapy and just finished cycle 4/6, diabetes, Graves' disease status post thyroidectomy, hyperlipidemia, presents to the emergency room with the chief complaint of lightheadedness and dizziness, ongoing profuse watery diarrhea and generalized weakness. This has happened to her in the past in relationship to her chemotherapy. She was just admitted and discharged 4 days ago with similar symptoms, and on discharge she was feeling a little bit better. She went home and she has had ongoing diarrhea, very poor appetite and decreased by mouth intake.  Patient denies any chest pain, denies any shortness of breath, she has no abdominal pain, endorses minimal nausea. She has poor appetite. She also states that in the last 3-4 days, she has noticed a decreasing urine output. In the emergency room, patient was found to be in acute renal failure with a creatinine of 3.3 from 1.0 on discharge 4 days ago (she saw her PCP 2 days ago and the creatinine was 1.2). During her previous hospitalization she was diagnosed with a urinary tract infection and was discharged on ciprofloxacin based on sensitivities. She was also found to be persistently hypocalcemic and was started on calcium supplements. It seems like she was supposed to stop the hydrochlorothiazide, however patient has still been taking that and patient has made no changes to her home medication list.  Review of Systems: As per history of present illness, otherwise negative  Past Medical History  Diagnosis Date  . Hyperlipidemia   . Obesity   . Asthma     prn neb. and inhaler  .  Osteoarthritis 2003    left knee  . Non-insulin dependent type 2 diabetes mellitus   . Graves' disease   . Ophthalmic manifestation of Graves disease   . Hypertension     on multiple meds., has been on med. > 14 yr.  . Dehydration 04/13/2013  . Breast cancer 01/2013    right   Past Surgical History  Procedure Laterality Date  . Total knee arthroplasty Left 2003  . Total thyroidectomy    . Colonoscopy  2007  . Tubal ligation  1985  . Colonoscopy w/ polypectomy  2009  . Portacath placement Right 02/17/2013    Procedure: INSERTION PORT-A-CATH;  Surgeon: Joyice Faster. Cornett, MD;  Location: [facility];  Service: General;  Laterality: Right;   Social History:  reports that she quit smoking about 22 years ago. She has never used smokeless tobacco. She reports that she does not drink alcohol or use illicit drugs.  Allergies  Allergen Reactions  . Procardia [Nifedipine] Other (See Comments)    MIGRAINES    Family History  Problem Relation Age of Onset  . Heart disease Father   . Lung cancer Father   . Diabetes Mother     Prior to Admission medications   Medication Sig Start Date End Date Taking? Authorizing Provider  albuterol (PROVENTIL HFA;VENTOLIN HFA) 108 (90 BASE) MCG/ACT inhaler Inhale 2 puffs into the lungs every 4 (four) hours as needed. 02/02/13  Yes Mikey Kirschner, MD  albuterol (PROVENTIL) (5 MG/ML) 0.5% nebulizer solution Take 2.5 mg by nebulization every 6 (six) hours as needed for wheezing or  shortness of breath.   Yes Historical Provider, MD  amLODipine (NORVASC) 10 MG tablet TAKE ONE TABLET BY MOUTH ONCE DAILY. 03/14/13  Yes Mikey Kirschner, MD  Artificial Tear Ointment (ARTIFICIAL TEARS) ointment Place 1 drop into both eyes as needed (for grey eye disease).    Yes Historical Provider, MD  aspirin 81 MG tablet Take 81 mg by mouth daily.   Yes Historical Provider, MD  benazepril (LOTENSIN) 40 MG tablet TAKE ONE TABLET BY MOUTH ONCE DAILY. 04/14/13  Yes Mikey Kirschner, MD  calcium carbonate (TUMS - DOSED IN MG ELEMENTAL CALCIUM) 500 MG chewable tablet Chew 2 tablets (400 mg of elemental calcium total) by mouth 2 (two) times daily. 05/04/13  Yes Estela Leonie Green, MD  ciprofloxacin (CIPRO) 250 MG tablet Take 250 mg by mouth 2 (two) times daily. 5 day therapy course. 05/04/13 patient has two day remaining on therapy. 05/04/13  Yes Estela Leonie Green, MD  cloNIDine (CATAPRES - DOSED IN MG/24 HR) 0.2 mg/24hr patch Place 0.2 mg onto the skin once a week. Mondays.   Yes Historical Provider, MD  cycloSPORINE (RESTASIS) 0.05 % ophthalmic emulsion Place 1 drop into both eyes 2 (two) times daily.    Yes Historical Provider, MD  dexamethasone (DECADRON) 4 MG tablet Take 4 mg by mouth as directed. Patient takes day before chemo and three days following chemo 03/15/13  Yes Historical Provider, MD  gabapentin (NEURONTIN) 100 MG capsule Take 1 capsule (100 mg total) by mouth 3 (three) times daily. 04/13/13  Yes Deatra Robinson, MD  hydrochlorothiazide (HYDRODIURIL) 25 MG tablet Take 25 mg by mouth daily.  04/01/13  Yes Historical Provider, MD  ibuprofen (ADVIL,MOTRIN) 200 MG tablet Take 400 mg by mouth every 8 (eight) hours as needed for moderate pain.   Yes Historical Provider, MD  ketoconazole (NIZORAL) 2 % cream Apply 1 application topically 2 (two) times daily. 05/05/13  Yes Mikey Kirschner, MD  labetalol (NORMODYNE) 300 MG tablet TAKE (1) TABLET BY MOUTH TWICE DAILY. 01/24/13  Yes Mikey Kirschner, MD  LANTUS SOLOSTAR 100 UNIT/ML Solostar Pen Inject 7-18 Units into the skin daily. Per sliding scale. 02/21/13  Yes Historical Provider, MD  levothyroxine (SYNTHROID, LEVOTHROID) 125 MCG tablet Take 1 tablet (125 mcg total) by mouth daily before breakfast. 05/04/13  Yes Estela Leonie Green, MD  metFORMIN (GLUCOPHAGE) 500 MG tablet TAKE (1) TABLET BY MOUTH TWICE A DAY WITH MEALS (BREAKFAST AND SUPPER). 04/22/13  Yes Mikey Kirschner, MD  omeprazole (PRILOSEC) 20 MG  capsule Take 1 capsule (20 mg total) by mouth daily. 03/04/13  Yes Wilmon Arms, MD  ondansetron (ZOFRAN ODT) 4 MG disintegrating tablet Take 1 tablet (4 mg total) by mouth every 8 (eight) hours as needed for nausea. 03/19/13  Yes Johnna Acosta, MD  pravastatin (PRAVACHOL) 40 MG tablet Take 40 mg by mouth 2 (two) times daily at 8 am and 10 pm.   Yes Historical Provider, MD  prochlorperazine (COMPAZINE) 10 MG tablet Take 10 mg by mouth daily as needed for nausea or vomiting.  02/09/13  Yes Historical Provider, MD  vitamin B-12 (CYANOCOBALAMIN) 500 MCG tablet Take 500 mcg by mouth daily.   Yes Historical Provider, MD  vitamin D, CHOLECALCIFEROL, 400 UNITS tablet Take 800 Units by mouth daily.   Yes Historical Provider, MD  ACCU-CHEK SMARTVIEW test strip USE ONE STRIP TO CHECK GLUCOSE 4 TIMES DAILY 10/01/12   Mikey Kirschner, MD   Physical Exam: Danley Danker  Vitals:   05/08/13 0946 05/08/13 1016 05/08/13 1017 05/08/13 1018  BP: 90/57 95/63 99/67  104/27  Pulse: 90 81 108 116  Temp: 97.9 F (36.6 C)     TempSrc: Oral     Resp: 20     SpO2: 99%        General:  No apparent distress  Eyes: PERRL, EOMI, no scleral icterus  ENT: moist oropharynx  Neck: supple, no JVD  Cardiovascular: regular rate without MRG; 2+ peripheral pulses  Respiratory: CTA biL, good air movement without wheezing, rhonchi or crackled  Abdomen: soft, non tender to palpation, positive bowel sounds, no guarding, no rebound  Skin: no rashes  Musculoskeletal: no peripheral edema  Psychiatric: normal mood and affect  Neurologic: Nonfocal  Labs on Admission:  Basic Metabolic Panel:  Recent Labs Lab 05/02/13 1245 05/03/13 0532 05/04/13 0559 05/06/13 1149 05/08/13 1028  NA 139 141 143 140 133*  K 2.7* 3.3* 3.8 3.9 3.9  CL 91* 99 105 98 94*  CO2 30 31 27 21 19   GLUCOSE 160* 162* 120* 185* 165*  BUN 13 9 5* 6 9  CREATININE 0.98 1.03 1.00 1.28* 3.33*  CALCIUM 6.5* 5.8* 6.0* 6.8* 6.7*  MG  --   --  0.7*  --   --     Liver Function Tests:  Recent Labs Lab 05/02/13 1245 05/04/13 0559 05/08/13 1028  AST 53* 41* 23  ALT 30 40* 23  ALKPHOS 120* 92 115  BILITOT 0.9 0.4 0.4  PROT 6.4 5.6* 6.7  ALBUMIN 3.5 3.1* 3.9    Recent Labs Lab 05/02/13 1245  LIPASE 47   CBC:  Recent Labs Lab 05/02/13 1245 05/03/13 0532 05/08/13 1028  WBC 3.9* 2.5* 6.0  NEUTROABS 2.2  --   --   HGB 9.6* 8.1* 8.9*  HCT 27.8* 23.8* 25.5*  MCV 85.0 85.3 83.1  PLT 105* 88* 158   Cardiac Enzymes:  Recent Labs Lab 05/02/13 1245 05/02/13 1837 05/03/13 0009  TROPONINI <0.30 <0.30 <0.30   CBG:  Recent Labs Lab 05/03/13 1613 05/03/13 2152 05/04/13 0723 05/04/13 1118 05/04/13 1608  GLUCAP 149* 127* 126* 132* 100*    Radiological Exams on Admission: No results found.  EKG: Independently reviewed.  Assessment/Plan Active Problems:   Hypertension   Diabetes mellitus, type 2   Tobacco abuse, in remission   Hyperlipidemia   Anemia, iron deficiency   Obesity   Dehydration   UTI (urinary tract infection)   Hypocalcemia   Unspecified hypothyroidism   Renal failure   CKD (chronic kidney disease) stage 2, GFR 60-89 ml/min   Acute renal failure   Diarrhea   Hyponatremia    Acute renal failure in the setting of chronic kidney disease stage II - Due to ongoing diarrhea with poor by mouth intake. Patient has received 2 L of normal saline in the emergency room, we'll continue IV fluids with normal saline at 125 cc per hour. Encourage by mouth intake. Monitor strict I&Os. - Discontinued ACE inhibitors, hydrochlorothiazide  Diarrhea - likely due to chemotherapy. Recheck a C. Difficile as she has been on antibiotics recently. - Continue IV fluids. Recently had a 2-D echo which showed normal systolic function and grade 1 diastolic dysfunction. Monitor respiratory status while receiving IV fluids.  Hypothyroidism - history of Graves' disease status post thyroidectomy. Her TSH few days ago was elevated  at 12 and her Synthroid dose was increased from 100-125. Continue 125. She will need a repeat TSH as an outpatient in 4-6  weeks.  Diabetes mellitus - will hold Lantus in the setting of renal failure. Start sliding scale and allow a diabetic diet.  Hypocalcemia - she has low vitamin D levels. Start vitamin D, check PTH.  UTI - previously diagnosed. Microbiology shows Escherichia coli sensitive to ciprofloxacin. Continue ciprofloxacin for 2 additional days.  Hyponatremia - due to dehydration. Monitor with BMP in the morning.  Hypertension -continue patch of clonidine to prevent rebound hypertension, discontinue labetalol and amlodipine as she is orthostatic in the emergency room.  Anemia - likely due to iron deficiency and in the setting of chemotherapy. Check fecal occult.  Hyperlipidemia - continue home statin     Diet: diabetic  Fluids: normal saline at 125 cc per hour  DVT Prophylaxis: SCDs. Transition to heparin subcutaneous if it FOBT is negative   Code Status: Full code   Family Communication: Discussed with patient and her daughter   Disposition Plan: Admit to inpatient   Time spent: 37   This note has been created with Surveyor, quantity. Any transcriptional errors are unintentional.   Costin M. Cruzita Lederer, MD [facility] Pager [phone]  If 7PM-7AM, please contact night-coverage [email or web address] Password [facility] 05/08/2013, 11:52 AM

## 2013-05-08 NOTE — ED Provider Notes (Signed)
CSN: [id number]     Arrival date & time 05/08/13  8841 History   First MD Initiated Contact with Patient 05/08/13 [phone]     Chief Complaint  Patient presents with  . Diarrhea    cancer patient      Patient is a 58 y.o. female presenting with diarrhea.  Diarrhea  patient throughout the emergency department because of ongoing diarrhea over the past week.  She was hospitalized and discharged 4 days ago.  At time of discharge she states she wasn't feeling much better but she was frustrated with her hospital stay and therefore went home.  She's continued to have ongoing diarrhea since then he were from 3-9 times a day.  She states the stool is watery.  She denies blood or mucus in her stool.  She denies abdominal pain.  She reports ongoing oral intake but continues to feel weak and lightheaded when she stands up.  No nausea or vomiting.  No chemotherapy since 10 days ago.  She's currently being treated for invasive breast cancer.  She has not followed up with her oncologist.  No fevers or chills.  No chest pain or shortness of breath.  No other complaints.  Past Medical History  Diagnosis Date  . Hyperlipidemia   . Obesity   . Asthma     prn neb. and inhaler  . Osteoarthritis 2003    left knee  . Non-insulin dependent type 2 diabetes mellitus   . Graves' disease   . Ophthalmic manifestation of Graves disease   . Hypertension     on multiple meds., has been on med. > 14 yr.  . Dehydration 04/13/2013  . Breast cancer 01/2013    right   Past Surgical History  Procedure Laterality Date  . Total knee arthroplasty Left 2003  . Total thyroidectomy    . Colonoscopy  2007  . Tubal ligation  1985  . Colonoscopy w/ polypectomy  2009  . Portacath placement Right 02/17/2013    Procedure: INSERTION PORT-A-CATH;  Surgeon: Joyice Faster. Cornett, MD;  Location: [facility];  Service: General;  Laterality: Right;   Family History  Problem Relation Age of Onset  . Heart disease Father   .  Lung cancer Father   . Diabetes Mother    History  Substance Use Topics  . Smoking status: Former Smoker -- 20 years    Quit date: 02/10/1991  . Smokeless tobacco: Never Used  . Alcohol Use: No   OB History   Grav Para Term Preterm Abortions TAB SAB Ect Mult Living                 Review of Systems  Gastrointestinal: Positive for diarrhea.  All other systems reviewed and are negative.     Allergies  Procardia  Home Medications   Prior to Admission medications   Medication Sig Start Date End Date Taking? Authorizing Provider  ACCU-CHEK SMARTVIEW test strip USE ONE STRIP TO CHECK GLUCOSE 4 TIMES DAILY 10/01/12   Mikey Kirschner, MD  albuterol (PROVENTIL HFA;VENTOLIN HFA) 108 (90 BASE) MCG/ACT inhaler Inhale 2 puffs into the lungs every 4 (four) hours as needed. 02/02/13   Mikey Kirschner, MD  albuterol (PROVENTIL) (5 MG/ML) 0.5% nebulizer solution Take 2.5 mg by nebulization every 6 (six) hours as needed for wheezing or shortness of breath.    Historical Provider, MD  amLODipine (NORVASC) 10 MG tablet TAKE ONE TABLET BY MOUTH ONCE DAILY. 03/14/13   Mikey Kirschner, MD  Artificial Tear Ointment (ARTIFICIAL TEARS) ointment Place 1 drop into both eyes as needed (for grey eye disease).     Historical Provider, MD  aspirin 81 MG tablet Take 81 mg by mouth daily.    Historical Provider, MD  benazepril (LOTENSIN) 40 MG tablet TAKE ONE TABLET BY MOUTH ONCE DAILY. 04/14/13   Mikey Kirschner, MD  calcium carbonate (TUMS - DOSED IN MG ELEMENTAL CALCIUM) 500 MG chewable tablet Chew 2 tablets (400 mg of elemental calcium total) by mouth 2 (two) times daily. 05/04/13   Erline Hau, MD  ciprofloxacin (CIPRO) 250 MG tablet Take 1 tablet (250 mg total) by mouth 2 (two) times daily. For 5 days 05/04/13   Erline Hau, MD  cloNIDine (CATAPRES - DOSED IN MG/24 HR) 0.2 mg/24hr patch APPLY 1 PATCH TRANSDERMALLY ONCE A WEEK. 04/14/13   Mikey Kirschner, MD  cycloSPORINE (RESTASIS)  0.05 % ophthalmic emulsion Place 1 drop into both eyes 2 (two) times daily.     Historical Provider, MD  dexamethasone (DECADRON) 4 MG tablet  03/15/13   Historical Provider, MD  gabapentin (NEURONTIN) 100 MG capsule Take 1 capsule (100 mg total) by mouth 3 (three) times daily. 04/13/13   Deatra Robinson, MD  hydrochlorothiazide (HYDRODIURIL) 25 MG tablet  04/01/13   Historical Provider, MD  ibuprofen (ADVIL,MOTRIN) 200 MG tablet Take 400 mg by mouth every 8 (eight) hours as needed for moderate pain.    Historical Provider, MD  ketoconazole (NIZORAL) 2 % cream Apply 1 application topically 2 (two) times daily. 05/05/13   Mikey Kirschner, MD  labetalol (NORMODYNE) 300 MG tablet TAKE (1) TABLET BY MOUTH TWICE DAILY. 01/24/13   Mikey Kirschner, MD  LANTUS SOLOSTAR 100 UNIT/ML Solostar Pen Inject 7-18 Units into the skin daily. Per sliding scale. 02/21/13   Historical Provider, MD  levothyroxine (SYNTHROID, LEVOTHROID) 125 MCG tablet Take 1 tablet (125 mcg total) by mouth daily before breakfast. 05/04/13   Erline Hau, MD  metFORMIN (GLUCOPHAGE) 500 MG tablet TAKE (1) TABLET BY MOUTH TWICE A DAY WITH MEALS (BREAKFAST AND SUPPER). 04/22/13   Mikey Kirschner, MD  omeprazole (PRILOSEC) 20 MG capsule Take 1 capsule (20 mg total) by mouth daily. 03/04/13   Wilmon Arms, MD  ondansetron (ZOFRAN ODT) 4 MG disintegrating tablet Take 1 tablet (4 mg total) by mouth every 8 (eight) hours as needed for nausea. 03/19/13   Johnna Acosta, MD  pravastatin (PRAVACHOL) 40 MG tablet Take 40 mg by mouth 2 (two) times daily at 8 am and 10 pm.    Historical Provider, MD  prochlorperazine (COMPAZINE) 10 MG tablet Take 10 mg by mouth daily as needed for nausea or vomiting.  02/09/13   Historical Provider, MD  vitamin B-12 (CYANOCOBALAMIN) 500 MCG tablet Take 500 mcg by mouth daily.    Historical Provider, MD   BP 104/27  Pulse 116  Temp(Src) 97.9 F (36.6 C) (Oral)  Resp 20  SpO2 99% Physical Exam  Nursing note and  vitals reviewed. Constitutional: She is oriented to person, place, and time. She appears well-developed and well-nourished. No distress.  HENT:  Head: Normocephalic and atraumatic.  Eyes: EOM are normal.  Neck: Normal range of motion.  Cardiovascular: Normal rate, regular rhythm and normal heart sounds.   Pulmonary/Chest: Effort normal and breath sounds normal.  Abdominal: Soft. She exhibits no distension. There is no tenderness.  Musculoskeletal: Normal range of motion.  Neurological: She is alert and oriented  to person, place, and time.  Skin: Skin is warm and dry.  Psychiatric: She has a normal mood and affect. Judgment normal.    ED Course  Procedures (including critical care time) Labs Review Labs Reviewed  CBC - Abnormal; Notable for the following:    RBC 3.07 (*)    Hemoglobin 8.9 (*)    HCT 25.5 (*)    RDW 21.0 (*)    All other components within normal limits  COMPREHENSIVE METABOLIC PANEL - Abnormal; Notable for the following:    Sodium 133 (*)    Chloride 94 (*)    Glucose, Bld 165 (*)    Creatinine, Ser 3.33 (*)    Calcium 6.7 (*)    GFR calc non Af Amer 14 (*)    GFR calc Af Amer 16 (*)    All other components within normal limits  URINALYSIS, ROUTINE W REFLEX MICROSCOPIC    Imaging Review No results found.   EKG Interpretation None      MDM   Final diagnoses:  Renal failure  Dehydration  Diarrhea    Patient with new renal failure likely secondary to ongoing volume depletion dehydration.  Orthostatic when she stands up.  IV fluids initiated.  Patient be admitted the hospital for ongoing IV fluids.    Hoy Morn, MD 05/08/13 [id number]

## 2013-05-09 LAB — COMPREHENSIVE METABOLIC PANEL
ALK PHOS: 91 U/L (ref 39–117)
ALT: 17 U/L (ref 0–35)
AST: 19 U/L (ref 0–37)
Albumin: 3 g/dL — ABNORMAL LOW (ref 3.5–5.2)
BUN: 8 mg/dL (ref 6–23)
CHLORIDE: 103 meq/L (ref 96–112)
CO2: 17 mEq/L — ABNORMAL LOW (ref 19–32)
Calcium: 6.1 mg/dL — CL (ref 8.4–10.5)
Creatinine, Ser: 2.26 mg/dL — ABNORMAL HIGH (ref 0.50–1.10)
GFR calc Af Amer: 26 mL/min — ABNORMAL LOW (ref >90)
GFR, EST NON AFRICAN AMERICAN: 23 mL/min — AB (ref >90)
GLUCOSE: 96 mg/dL (ref 70–99)
POTASSIUM: 3.6 meq/L — AB (ref 3.7–5.3)
Sodium: 137 mEq/L (ref 137–147)
Total Bilirubin: 0.3 mg/dL (ref 0.3–1.2)
Total Protein: 5.4 g/dL — ABNORMAL LOW (ref 6.0–8.3)

## 2013-05-09 LAB — GLUCOSE, CAPILLARY
GLUCOSE-CAPILLARY: 154 mg/dL — AB (ref 70–99)
GLUCOSE-CAPILLARY: 110 mg/dL — AB (ref 70–99)
Glucose-Capillary: 101 mg/dL — ABNORMAL HIGH (ref 70–99)

## 2013-05-09 LAB — MAGNESIUM: Magnesium: 0.7 mg/dL — CL (ref 1.5–2.5)

## 2013-05-09 LAB — CBC
HCT: 21.2 % — ABNORMAL LOW (ref 36.0–46.0)
Hemoglobin: 7.3 g/dL — ABNORMAL LOW (ref 12.0–15.0)
MCH: 29.1 pg (ref 26.0–34.0)
MCHC: 34.4 g/dL (ref 30.0–36.0)
MCV: 84.5 fL (ref 78.0–100.0)
Platelets: 127 10*3/uL — ABNORMAL LOW (ref 150–400)
RBC: 2.51 MIL/uL — ABNORMAL LOW (ref 3.87–5.11)
RDW: 21 % — AB (ref 11.5–15.5)
WBC: 5.4 10*3/uL (ref 4.0–10.5)

## 2013-05-09 LAB — PTH, INTACT AND CALCIUM
Calcium, Total (PTH): 6.4 mg/dL — CL (ref 8.4–10.5)
PTH: 57.9 pg/mL (ref 14.0–72.0)

## 2013-05-09 LAB — CLOSTRIDIUM DIFFICILE BY PCR: CDIFFPCR: NEGATIVE

## 2013-05-09 LAB — ABO/RH: ABO/RH(D): A POS

## 2013-05-09 LAB — PREPARE RBC (CROSSMATCH)

## 2013-05-09 MED ORDER — SODIUM CHLORIDE 0.9 % IV SOLN
1.0000 g | Freq: Once | INTRAVENOUS | Status: AC
  Administered 2013-05-09: 1 g via INTRAVENOUS
  Filled 2013-05-09: qty 10

## 2013-05-09 MED ORDER — SODIUM CHLORIDE 0.9 % IJ SOLN
10.0000 mL | INTRAMUSCULAR | Status: DC | PRN
  Administered 2013-05-09 – 2013-05-11 (×4): 10 mL

## 2013-05-09 MED ORDER — LACTATED RINGERS IV SOLN
INTRAVENOUS | Status: DC
  Administered 2013-05-09: 100 mL/h via INTRAVENOUS
  Administered 2013-05-10: 14:00:00 via INTRAVENOUS

## 2013-05-09 NOTE — Progress Notes (Signed)
Critical value CA 6.1 called in to hospitalist. New orders given.

## 2013-05-09 NOTE — Progress Notes (Signed)
CRITICAL VALUE ALERT  Critical value received:  Ca 6.1  Date of notification:  [id number]  Time of notification:  0545  Critical value read back: y  Nurse who received alert: Liliane Bade  MD notified (1st page):  [phone]  Time of first page:  0630  MD notified (2nd page):Schorr  Time of second [id number]  Responding MD:  Schorr  Time MD responded: [phone]

## 2013-05-09 NOTE — Progress Notes (Signed)
PROGRESS NOTE  [name] QGB:[id number] DOB: 09/24/1955 DOA: 05/08/2013 PCP: Rubbie Battiest, MD  HPI: [name] is a 58 y.o. female has a past medical history significant for invasive ductal carcinoma of the breast followed by Dr. Humphrey Rolls from oncology, currently undergoing chemotherapy and just finished cycle 4/6, diabetes, Graves' disease status post thyroidectomy, hyperlipidemia, presents to the emergency room with the chief complaint of lightheadedness and dizziness, ongoing profuse watery diarrhea and generalized weakness. This has happened to her in the past in relationship to her chemotherapy. She was just admitted and discharged 4 days ago with similar symptoms, and on discharge she was feeling a little bit better. She went home and she has had ongoing diarrhea, very poor appetite and decreased by mouth intake. Patient denies any chest pain, denies any shortness of breath, she has no abdominal pain, endorses minimal nausea. She has poor appetite. She also states that in the last 3-4 days, she has noticed a decreasing urine output. In the emergency room, patient was found to be in acute renal failure with a creatinine of 3.3 from 1.0 on discharge 4 days ago (she saw her PCP 2 days ago and the creatinine was 1.2). During her previous hospitalization she was diagnosed with a urinary tract infection and was discharged on ciprofloxacin based on sensitivities. She was also found to be persistently hypocalcemic and was started on calcium supplements. It seems like she was supposed to stop the hydrochlorothiazide, however patient has still been taking that and patient has made no changes to her home medication list.  Assessment/Plan:  Acute renal failure in the setting of chronic kidney disease stage II  - Due to ongoing diarrhea with poor by mouth intake. Patient has received 2 L of normal saline in the emergency room, continued IV fluids with normal saline at 125 cc per hour overnight, renal  function improving this morning.  - Discontinued ACE inhibitors, hydrochlorothiazide    Diarrhea - likely due to chemotherapy.  - C diff negative.  - Continue IV fluids. Recently had a 2-D echo which showed normal systolic function and grade 1 diastolic dysfunction. Monitor respiratory status while receiving IV fluids.  - patient drinks milk everyday, advised to hold it for few weeks   Hypothyroidism - history of Graves' disease status post thyroidectomy. Her TSH few days ago was elevated at 12 and her Synthroid dose was increased from 100-125. Continue 125. She will need a repeat TSH as an outpatient in 4-6 weeks.   Diabetes mellitus - will hold Lantus in the setting of renal failure. Start sliding scale and allow a diabetic diet.   Hypocalcemia - she has low vitamin D levels. Start vitamin D, check PTH.  - Ca iv again today  UTI - previously diagnosed. Microbiology shows Escherichia coli sensitive to ciprofloxacin. Continue ciprofloxacin for 1 additional days.   Hyponatremia - due to dehydration. Monitor with BMP in the morning.  - resolved with fluids.   Hypertension -continue patch of clonidine to prevent rebound hypertension, - continue to hold BP meds  Anemia - likely due to chemotherapy - transfuse 1U today.   Thrombocytopenia - due to chemotherapy, monitor.   Hyperlipidemia - continue home statin   Diet: carb  Fluids: LR @100  DVT Prophylaxis: SCD  Code Status: Full Family Communication: none  Disposition Plan: inpatient  Consultants:  None   Procedures:  None    Antibiotics  Anti-infectives   Start     Dose/Rate Route Frequency Ordered Stop  05/08/13 2000  ciprofloxacin (CIPRO) tablet 250 mg     250 mg Oral 2 times daily 05/08/13 1318 05/10/13 1959     Antibiotics Given (last 72 hours)   Date/Time Action Medication Dose   05/08/13 2030 Given   ciprofloxacin (CIPRO) tablet 250 mg 250 mg   05/09/13 0858 Given   ciprofloxacin (CIPRO) tablet 250 mg  250 mg      HPI/Subjective: Feeling better, continues to have diarrheal BMs  Objective: Filed Vitals:   05/08/13 1220 05/08/13 1500 05/08/13 2249 05/09/13 0629  BP: 108/69 91/44 105/51 98/54  Pulse: 76 89 82 92  Temp: 98 F (36.7 C) 98.3 F (36.8 C) 98.8 F (37.1 C) 98.6 F (37 C)  TempSrc: Oral Oral Oral Oral  Resp: 18 18 18 18   Height: 5' 4.5" (1.638 m)     Weight: 117.7 kg (259 lb 7.7 oz)     SpO2: 100% 100% 100% 99%    Intake/Output Summary (Last 24 hours) at 05/09/13 1007 Last data filed at 05/08/13 1900  Gross per 24 hour  Intake    240 ml  Output    200 ml  Net     40 ml   Filed Weights   05/08/13 1220  Weight: 117.7 kg (259 lb 7.7 oz)    Exam:   General:  NAD  Cardiovascular: regular rate and rhythm, without MRG  Respiratory: good air movement, clear to auscultation throughout, no wheezing, ronchi or rales  Abdomen: soft, not tender to palpation, positive bowel sounds  MSK: no peripheral edema  Neuro: non focal  Data Reviewed: Basic Metabolic Panel:  Recent Labs Lab 05/03/13 0532 05/04/13 0559 05/06/13 1149 05/08/13 1028 05/09/13 0456  NA 141 143 140 133* 137  K 3.3* 3.8 3.9 3.9 3.6*  CL 99 105 98 94* 103  CO2 31 27 21 19  17*  GLUCOSE 162* 120* 185* 165* 96  BUN 9 5* 6 9 8   CREATININE 1.03 1.00 1.28* 3.33* 2.26*  CALCIUM 5.8* 6.0* 6.8* 6.7* 6.1*  MG  --  0.7*  --   --   --    Liver Function Tests:  Recent Labs Lab 05/02/13 1245 05/04/13 0559 05/08/13 1028 05/09/13 0456  AST 53* 41* 23 19  ALT 30 40* 23 17  ALKPHOS 120* 92 115 91  BILITOT 0.9 0.4 0.4 0.3  PROT 6.4 5.6* 6.7 5.4*  ALBUMIN 3.5 3.1* 3.9 3.0*    Recent Labs Lab 05/02/13 1245  LIPASE 47   No results found for this basename: AMMONIA,  in the last 168 hours CBC:  Recent Labs Lab 05/02/13 1245 05/03/13 0532 05/08/13 1028 05/09/13 0456  WBC 3.9* 2.5* 6.0 5.4  NEUTROABS 2.2  --   --   --   HGB 9.6* 8.1* 8.9* 7.3*  HCT 27.8* 23.8* 25.5* 21.2*  MCV  85.0 85.3 83.1 84.5  PLT 105* 88* 158 127*   Cardiac Enzymes:  Recent Labs Lab 05/02/13 1245 05/02/13 1837 05/03/13 0009  TROPONINI <0.30 <0.30 <0.30   BNP (last 3 results) No results found for this basename: PROBNP,  in the last 8760 hours CBG:  Recent Labs Lab 05/04/13 1608 05/08/13 1203 05/08/13 1650 05/08/13 2247 05/09/13 0737  GLUCAP 100* 129* 133* 117* 101*    Recent Results (from the past 240 hour(s))  URINE CULTURE     Status: None   Collection Time    05/02/13  7:11 PM      Result Value Ref Range Status   Specimen  Description URINE, CLEAN CATCH   Final   Special Requests NONE   Final   Culture  Setup Time     Final   Value: 05/02/2013 23:35     Performed at [facility]     Final   Value: >=100,000 COLONIES/ML     Performed at [facility]   Culture     Final   Value: ESCHERICHIA COLI     Performed at [facility]   Report Status 05/04/2013 FINAL   Final   Organism ID, Bacteria ESCHERICHIA COLI   Final  CLOSTRIDIUM DIFFICILE BY PCR     Status: None   Collection Time    05/03/13  7:45 PM      Result Value Ref Range Status   C difficile by pcr NEGATIVE  NEGATIVE Final  CLOSTRIDIUM DIFFICILE BY PCR     Status: None   Collection Time    05/08/13  3:08 PM      Result Value Ref Range Status   C difficile by pcr NEGATIVE  NEGATIVE Final   Comment: Performed at [facility]     Studies: No results found.  Scheduled Meds: . cholecalciferol  1,000 Units Oral Daily  . cholecalciferol  800 Units Oral Daily  . ciprofloxacin  250 mg Oral BID  . cloNIDine  0.2 mg Transdermal Weekly  . cyanocobalamin  500 mcg Oral Daily  . cycloSPORINE  1 drop Both Eyes BID  . gabapentin  100 mg Oral TID  . insulin aspart  0-9 Units Subcutaneous TID WC  . levothyroxine  125 mcg Oral QAC breakfast  . pantoprazole  40 mg Oral Daily  . simvastatin  20 mg Oral q1800   Continuous Infusions: . sodium chloride    . sodium  chloride 125 mL/hr (05/09/13 8676)    Active Problems:   Hypertension   Diabetes mellitus, type 2   Tobacco abuse, in remission   Hyperlipidemia   Anemia, iron deficiency   Obesity   Dehydration   UTI (urinary tract infection)   Hypocalcemia   Unspecified hypothyroidism   Renal failure   CKD (chronic kidney disease) stage 2, GFR 60-89 ml/min   Acute renal failure   Diarrhea   Hyponatremia  Time spent: 35  This note has been created with Surveyor, quantity. Any transcriptional errors are unintentional.   Marzetta Board, MD [facility] Pager [phone]. If 7 PM - 7 AM, please contact night-coverage at [email or web address], password [facility] 05/09/2013, 10:07 AM  LOS: 1 day

## 2013-05-09 NOTE — Progress Notes (Signed)
Quick Note:  I put the orders in STAT. I was about to call the patient, but then I just realized that I think she is in the hospital? ______

## 2013-05-10 LAB — GLUCOSE, CAPILLARY
GLUCOSE-CAPILLARY: 151 mg/dL — AB (ref 70–99)
GLUCOSE-CAPILLARY: 99 mg/dL (ref 70–99)
GLUCOSE-CAPILLARY: 145 mg/dL — AB (ref 70–99)
Glucose-Capillary: 116 mg/dL — ABNORMAL HIGH (ref 70–99)
Glucose-Capillary: 126 mg/dL — ABNORMAL HIGH (ref 70–99)

## 2013-05-10 LAB — BASIC METABOLIC PANEL
BUN: 5 mg/dL — AB (ref 6–23)
CALCIUM: 6.3 mg/dL — AB (ref 8.4–10.5)
CO2: 18 mEq/L — ABNORMAL LOW (ref 19–32)
CREATININE: 1.25 mg/dL — AB (ref 0.50–1.10)
Chloride: 107 mEq/L (ref 96–112)
GFR, EST AFRICAN AMERICAN: 54 mL/min — AB (ref >90)
GFR, EST NON AFRICAN AMERICAN: 46 mL/min — AB (ref >90)
Glucose, Bld: 122 mg/dL — ABNORMAL HIGH (ref 70–99)
Potassium: 3.6 mEq/L — ABNORMAL LOW (ref 3.7–5.3)
Sodium: 141 mEq/L (ref 137–147)

## 2013-05-10 LAB — CBC
HCT: 24.7 % — ABNORMAL LOW (ref 36.0–46.0)
Hemoglobin: 8.3 g/dL — ABNORMAL LOW (ref 12.0–15.0)
MCH: 28.4 pg (ref 26.0–34.0)
MCHC: 33.6 g/dL (ref 30.0–36.0)
MCV: 84.6 fL (ref 78.0–100.0)
Platelets: 137 10*3/uL — ABNORMAL LOW (ref 150–400)
RBC: 2.92 MIL/uL — ABNORMAL LOW (ref 3.87–5.11)
RDW: 20.1 % — AB (ref 11.5–15.5)
WBC: 6.4 10*3/uL (ref 4.0–10.5)

## 2013-05-10 LAB — TYPE AND SCREEN
ABO/RH(D): A POS
Antibody Screen: NEGATIVE
Unit division: 0

## 2013-05-10 MED ORDER — VITAMIN D (ERGOCALCIFEROL) 1.25 MG (50000 UNIT) PO CAPS
50000.0000 [IU] | ORAL_CAPSULE | ORAL | Status: DC
  Administered 2013-05-10: 50000 [IU] via ORAL
  Filled 2013-05-10 (×2): qty 1

## 2013-05-10 MED ORDER — SODIUM CHLORIDE 0.9 % IV SOLN
2.0000 g | Freq: Once | INTRAVENOUS | Status: AC
  Administered 2013-05-10: 2 g via INTRAVENOUS
  Filled 2013-05-10: qty 20

## 2013-05-10 MED ORDER — CALCIUM CITRATE 950 (200 CA) MG PO TABS
200.0000 mg | ORAL_TABLET | Freq: Two times a day (BID) | ORAL | Status: DC
  Administered 2013-05-10 – 2013-05-11 (×3): 200 mg via ORAL
  Filled 2013-05-10 (×6): qty 1

## 2013-05-10 NOTE — Discharge Instructions (Signed)
Take a vitamin D 50,000 units per week for the next 8 weeks, then resume 1000 units per day. Take calcium citrate 2 times a day with lunch and dinner. Avoid taking that in the morning because you are on Synthroid.

## 2013-05-10 NOTE — Progress Notes (Signed)
PROGRESS NOTE  [name]:[id number] DOB: 15-Sep-1955 DOA: 05/08/2013 PCP: Rubbie Battiest, MD  HPI: [name] is a 58 y.o. female has a past medical history significant for invasive ductal carcinoma of the breast followed by Dr. Humphrey Rolls from oncology, currently undergoing chemotherapy and just finished cycle 4/6, diabetes, Graves' disease status post thyroidectomy, hyperlipidemia, presents to the emergency room with the chief complaint of lightheadedness and dizziness, ongoing profuse watery diarrhea and generalized weakness. This has happened to her in the past in relationship to her chemotherapy. She was just admitted and discharged 4 days ago with similar symptoms, and on discharge she was feeling a little bit better. She went home and she has had ongoing diarrhea, very poor appetite and decreased by mouth intake. Patient denies any chest pain, denies any shortness of breath, she has no abdominal pain, endorses minimal nausea. She has poor appetite. She also states that in the last 3-4 days, she has noticed a decreasing urine output. In the emergency room, patient was found to be in acute renal failure with a creatinine of 3.3 from 1.0 on discharge 4 days ago (she saw her PCP 2 days ago and the creatinine was 1.2). During her previous hospitalization she was diagnosed with a urinary tract infection and was discharged on ciprofloxacin based on sensitivities. She was also found to be persistently hypocalcemic and was started on calcium supplements. It seems like she was supposed to stop the hydrochlorothiazide, however patient has still been taking that and patient has made no changes to her home medication list.  Assessment/Plan:  Acute renal failure in the setting of chronic kidney disease stage II  - Due to ongoing diarrhea with poor by mouth intake. Patient has received 2 L of normal saline in the emergency room, continued IV fluids, renal function continues to improve - Discontinued ACE  inhibitors, hydrochlorothiazide  Diarrhea - likely due to chemotherapy.  - C diff negative.  - Continue IV fluids. Recently had a 2-D echo which showed normal systolic function and grade 1 diastolic dysfunction. Monitor respiratory status while receiving IV fluids.  - patient drinks milk everyday, advised to hold it for few weeks as this can exacerbate her diarrhea  Hypothyroidism - history of Graves' disease status post thyroidectomy. Her TSH few days ago was elevated at 12 and her Synthroid dose was increased from 100-125. Continue 125. She will need a repeat TSH as an outpatient in 4-6 weeks.   Diabetes mellitus - will hold Lantus in the setting of renal failure. Start sliding scale and allow a diabetic diet.   Hypocalcemia - she has low vitamin D levels, will start vitamin D 50,000 units per week for 8 weeks, then she can resume 1000 units per day. Recommend that she follows up with her PCP in 2-3 months for vitamin D recheck. - Have also started her on calcium citrate that she is to take with lunch and dinner. Patient was instructed not to take calcium citrate in the morning since she is on Synthroid and can interfere with the thyroid medication absorption  UTI - previously diagnosed. Microbiology shows Escherichia coli sensitive to ciprofloxacin. Continue ciprofloxacin, last day of treatment is 4/28  Hyponatremia - due to dehydration.  - resolved with fluids.   Hypertension -continue patch of clonidine to prevent rebound hypertension, - continue to hold BP meds  Anemia - likely due to chemotherapy - transfuse 1U today 4/27.   Thrombocytopenia - due to chemotherapy, monitor.   Hyperlipidemia - continue  home statin   Diet: carb  Fluids: LR @ 50 DVT Prophylaxis: SCD  Code Status: Full Family Communication: none  Disposition Plan: inpatient  Consultants:  None   Procedures:  None    Antibiotics  Anti-infectives   Start     Dose/Rate Route Frequency Ordered Stop    05/08/13 2000  ciprofloxacin (CIPRO) tablet 250 mg     250 mg Oral 2 times daily 05/08/13 1318 05/10/13 0845     Antibiotics Given (last 72 hours)   Date/Time Action Medication Dose   05/08/13 2030 Given   ciprofloxacin (CIPRO) tablet 250 mg 250 mg   05/09/13 0858 Given   ciprofloxacin (CIPRO) tablet 250 mg 250 mg   05/09/13 2114 Given   ciprofloxacin (CIPRO) tablet 250 mg 250 mg   05/10/13 0845 Given   ciprofloxacin (CIPRO) tablet 250 mg 250 mg      HPI/Subjective: Feeling better, continues to have diarrheal BMs, but appreciates improvement  Objective: Filed Vitals:   05/09/13 1845 05/09/13 1900 05/09/13 2131 05/10/13 0628  BP: 132/75 122/80 144/69 120/73  Pulse: 90 94 94   Temp: 98.6 F (37 C) 98.5 F (36.9 C) 98.9 F (37.2 C) 98.4 F (36.9 C)  TempSrc: Oral Oral Oral Oral  Resp: 18 20 22 20   Height:      Weight:      SpO2: 97% 98% 99% 97%    Intake/Output Summary (Last 24 hours) at 05/10/13 1110 Last data filed at 05/10/13 0700  Gross per 24 hour  Intake 2226.67 ml  Output      0 ml  Net 2226.67 ml   Filed Weights   05/08/13 1220  Weight: 117.7 kg (259 lb 7.7 oz)    Exam:  General:  NAD  Cardiovascular: regular rate and rhythm, without MRG  Respiratory: good air movement, clear to auscultation throughout, no wheezing, ronchi or rales  Abdomen: soft, not tender to palpation, positive bowel sounds  MSK: no peripheral edema  Neuro: non focal  Data Reviewed: Basic Metabolic Panel:  Recent Labs Lab 05/04/13 0559 05/06/13 1149 05/08/13 1028 05/08/13 1215 05/09/13 0456 05/10/13 0430  NA 143 140 133*  --  137 141  K 3.8 3.9 3.9  --  3.6* 3.6*  CL 105 98 94*  --  103 107  CO2 27 21 19   --  17* 18*  GLUCOSE 120* 185* 165*  --  96 122*  BUN 5* 6 9  --  8 5*  CREATININE 1.00 1.28* 3.33*  --  2.26* 1.25*  CALCIUM 6.0* 6.8* 6.7* 6.4* 6.1* 6.3*  MG 0.7*  --   --   --  0.7*  --    Liver Function Tests:  Recent Labs Lab 05/04/13 0559  05/08/13 1028 05/09/13 0456  AST 41* 23 19  ALT 40* 23 17  ALKPHOS 92 115 91  BILITOT 0.4 0.4 0.3  PROT 5.6* 6.7 5.4*  ALBUMIN 3.1* 3.9 3.0*   CBC:  Recent Labs Lab 05/08/13 1028 05/09/13 0456 05/10/13 0430  WBC 6.0 5.4 6.4  HGB 8.9* 7.3* 8.3*  HCT 25.5* 21.2* 24.7*  MCV 83.1 84.5 84.6  PLT 158 127* 137*   CBG:  Recent Labs Lab 05/09/13 0737 05/09/13 1141 05/09/13 1639 05/09/13 2129 05/10/13 0716  GLUCAP 101* 154* 110* 116* 126*    Recent Results (from the past 240 hour(s))  URINE CULTURE     Status: None   Collection Time    05/02/13  7:11 PM  Result Value Ref Range Status   Specimen Description URINE, CLEAN CATCH   Final   Special Requests NONE   Final   Culture  Setup Time     Final   Value: 05/02/2013 23:35     Performed at [place]     Final   Value: >=100,000 COLONIES/ML     Performed at [facility]   Culture     Final   Value: ESCHERICHIA COLI     Performed at [facility]   Report Status 05/04/2013 FINAL   Final   Organism ID, Bacteria ESCHERICHIA COLI   Final  CLOSTRIDIUM DIFFICILE BY PCR     Status: None   Collection Time    05/03/13  7:45 PM      Result Value Ref Range Status   C difficile by pcr NEGATIVE  NEGATIVE Final  CLOSTRIDIUM DIFFICILE BY PCR     Status: None   Collection Time    05/08/13  3:08 PM      Result Value Ref Range Status   C difficile by pcr NEGATIVE  NEGATIVE Final   Comment: Performed at [facility]     Studies: No results found.  Scheduled Meds: . calcium citrate  200 mg of elemental calcium Oral BID PC  . cloNIDine  0.2 mg Transdermal Weekly  . cyanocobalamin  500 mcg Oral Daily  . cycloSPORINE  1 drop Both Eyes BID  . gabapentin  100 mg Oral TID  . insulin aspart  0-9 Units Subcutaneous TID WC  . levothyroxine  125 mcg Oral QAC breakfast  . pantoprazole  40 mg Oral Daily  . simvastatin  20 mg Oral q1800  . Vitamin D (Ergocalciferol)  50,000 Units  Oral Q7 days   Continuous Infusions: . sodium chloride 10 mL/hr (05/10/13 0822)  . lactated ringers 100 mL/hr at 05/09/13 2200    Active Problems:   Hypertension   Diabetes mellitus, type 2   Tobacco abuse, in remission   Hyperlipidemia   Anemia, iron deficiency   Obesity   Dehydration   UTI (urinary tract infection)   Hypocalcemia   Unspecified hypothyroidism   Renal failure   CKD (chronic kidney disease) stage 2, GFR 60-89 ml/min   Acute renal failure   Diarrhea   Hyponatremia  Time spent: 25  This note has been created with Surveyor, quantity. Any transcriptional errors are unintentional.   Marzetta Board, MD [facility] Pager [phone]. If 7 PM - 7 AM, please contact night-coverage at [email or web address], password [facility] 05/10/2013, 11:10 AM  LOS: 2 days

## 2013-05-10 NOTE — Progress Notes (Signed)
CRITICAL VALUE ALERT  Critical value received:  Calcium 6.3 (increase from yesterday)  Date of notification:  05/10/2013  Time of notification:  0555  Critical value read back:yes  Nurse who received alert:  Myriam Forehand, RN  MD notified (1st page):   Time of first page: MD notified (2nd page):  Time of second page:  Responding MD: Time MD responded:

## 2013-05-11 DIAGNOSIS — E440 Moderate protein-calorie malnutrition: Secondary | ICD-10-CM | POA: Insufficient documentation

## 2013-05-11 DIAGNOSIS — N179 Acute kidney failure, unspecified: Principal | ICD-10-CM

## 2013-05-11 DIAGNOSIS — N182 Chronic kidney disease, stage 2 (mild): Secondary | ICD-10-CM

## 2013-05-11 DIAGNOSIS — E860 Dehydration: Secondary | ICD-10-CM

## 2013-05-11 DIAGNOSIS — R197 Diarrhea, unspecified: Secondary | ICD-10-CM

## 2013-05-11 LAB — COMPREHENSIVE METABOLIC PANEL
ALBUMIN: 3.3 g/dL — AB (ref 3.5–5.2)
ALK PHOS: 94 U/L (ref 39–117)
ALT: 14 U/L (ref 0–35)
AST: 19 U/L (ref 0–37)
BUN: 3 mg/dL — AB (ref 6–23)
CALCIUM: 6.7 mg/dL — AB (ref 8.4–10.5)
CO2: 21 mEq/L (ref 19–32)
Chloride: 105 mEq/L (ref 96–112)
Creatinine, Ser: 1.1 mg/dL (ref 0.50–1.10)
GFR calc Af Amer: 63 mL/min — ABNORMAL LOW (ref >90)
GFR calc non Af Amer: 54 mL/min — ABNORMAL LOW (ref >90)
Glucose, Bld: 113 mg/dL — ABNORMAL HIGH (ref 70–99)
POTASSIUM: 3.1 meq/L — AB (ref 3.7–5.3)
Sodium: 141 mEq/L (ref 137–147)
TOTAL PROTEIN: 5.9 g/dL — AB (ref 6.0–8.3)
Total Bilirubin: 0.5 mg/dL (ref 0.3–1.2)

## 2013-05-11 LAB — GLUCOSE, CAPILLARY
GLUCOSE-CAPILLARY: 119 mg/dL — AB (ref 70–99)
Glucose-Capillary: 177 mg/dL — ABNORMAL HIGH (ref 70–99)

## 2013-05-11 LAB — CBC
HEMATOCRIT: 25.1 % — AB (ref 36.0–46.0)
Hemoglobin: 8.6 g/dL — ABNORMAL LOW (ref 12.0–15.0)
MCH: 29.2 pg (ref 26.0–34.0)
MCHC: 34.3 g/dL (ref 30.0–36.0)
MCV: 85.1 fL (ref 78.0–100.0)
Platelets: 138 10*3/uL — ABNORMAL LOW (ref 150–400)
RBC: 2.95 MIL/uL — ABNORMAL LOW (ref 3.87–5.11)
RDW: 20.2 % — AB (ref 11.5–15.5)
WBC: 5.6 10*3/uL (ref 4.0–10.5)

## 2013-05-11 MED ORDER — SODIUM CHLORIDE 0.9 % IV SOLN
2.0000 g | Freq: Once | INTRAVENOUS | Status: AC
  Administered 2013-05-11: 2 g via INTRAVENOUS
  Filled 2013-05-11: qty 20

## 2013-05-11 MED ORDER — CALCIUM CITRATE 950 (200 CA) MG PO TABS: 200.0000 mg | ORAL_TABLET | Freq: Two times a day (BID) | ORAL | Status: DC

## 2013-05-11 MED ORDER — POTASSIUM CHLORIDE CRYS ER 20 MEQ PO TBCR
40.0000 meq | EXTENDED_RELEASE_TABLET | Freq: Once | ORAL | Status: AC
  Administered 2013-05-11: 40 meq via ORAL
  Filled 2013-05-11: qty 2

## 2013-05-11 MED ORDER — VITAMIN D (ERGOCALCIFEROL) 1.25 MG (50000 UNIT) PO CAPS: 50000.0000 [IU] | ORAL_CAPSULE | ORAL | Status: DC

## 2013-05-11 MED ORDER — GLUCERNA SHAKE PO LIQD
237.0000 mL | Freq: Two times a day (BID) | ORAL | Status: DC | PRN
  Filled 2013-05-11: qty 237

## 2013-05-11 NOTE — Progress Notes (Addendum)
Patient discharge home with daughter, alert and oriented, discharge instructions given, patient verbalize understanding of discharge orders given, My Chart acces declined at this time, patient in stable condition at this time

## 2013-05-11 NOTE — Evaluation (Signed)
I have reviewed this note and agree with all findings. Kati Hawke Villalpando, PT, DPT Pager: [phone]   

## 2013-05-11 NOTE — Progress Notes (Signed)
INITIAL NUTRITION ASSESSMENT  DOCUMENTATION CODES Per approved criteria  -Non-severe (moderate) malnutrition in the context of chronic illness  Pt meets criteria for moderate MALNUTRITION in the context of chronic illness as evidenced by PO intake <75% for one month, moderate muscle wasting.  INTERVENTION: -Provided pt with "Diarrhea Nutrition Therapy" handout and "Poor Appetite" handout -Discussed chemo-related nutrition therapy -Encouraged fluid intake -Allow one Glucerna shake PRN -Will continue to monitor  NUTRITION DIAGNOSIS: Inadequate oral intake related to loose stools/poor appetite as evidenced by 27 lbs unintentional wt loss, poor PO pta.   Goal: Pt to meet >/= 90% of their estimated nutrition needs    Monitor:  Total protein/energy intake, labs, weights, GI profile, education needs  Reason for Assessment: Consult  58 y.o. female  Admitting Dx: <principal problem not specified>  ASSESSMENT: [name] is a 58 y.o. female has a past medical history significant for invasive ductal carcinoma of the breast followed by Dr. Humphrey Rolls from oncology, currently undergoing chemotherapy and just finished cycle 4/6, diabetes, Graves' disease status post thyroidectomy, hyperlipidemia, presents to the emergency room with the chief complaint of lightheadedness and dizziness, ongoing profuse watery diarrhea and generalized weakness  -Pt endorsed unintentional 27 lbs wt loss since beginning chemo treatments. Usual body weight around 280 lbs in 09/2012. Was educated by outpatient oncology RD on 02/2013 about DM diet and blood glucose control while using steroids -Started having week long episodes of loose stools after 2-3 cycle of chemo -Has had difficulty complying with diet recommendations for loose stools and diabetes. -Pt has been drinking water, decaffeinated beverages, and occasional Gatorade. Recommended pt consume 1 cup fluid after every diarrhea episodes -Appetite fluctuates with  chemo treatments, ate 25-50% during previous admit in 05/03/13; however, is currently eating well >75% of meals. Denied nausea/abd pain post meals. Encouraged high kcal/protein source for weight maintenance. Allow one supplement as requested by pt if appetite decreases. Promoted low fiber/high potassium foods with gradual introduction of fiber as tolerated -Was drinking Ensure/Glucerna pta but stopped as MD recommended to decrease dairy products to help w/ loose stools. Pt allowed 16 oz dairy/dairy products per day, will add Glucerna supplement and informed pt to consume minimal milk/yogurt for remainder of day -Mg/K Low-being repleted -Abnormal renal profile d/t ARF on CKD2 -Nutrition Focused Physical Exam:  Subcutaneous Fat:  Orbital Region:WDL Upper Arm Region: WDL Thoracic and Lumbar Region: WDL  Muscle:  Temple Region: moderate Clavicle Bone Region: WDL Clavicle and Acromion Bone Region: WDL Scapular Bone Region: WDL Dorsal Hand: WDL Patellar Region: WDL Anterior Thigh Region: WDL Posterior Calf Region: moderate  Edema: + 1 edema in RLE and LLE    Height: Ht Readings from Last 1 Encounters:  05/08/13 5' 4.5" (1.638 m)    Weight: Wt Readings from Last 1 Encounters:  05/08/13 259 lb 7.7 oz (117.7 kg)    Ideal Body Weight: 120 lbs  % Ideal Body Weight: 219%  Wt Readings from Last 10 Encounters:  05/08/13 259 lb 7.7 oz (117.7 kg)  05/05/13 254 lb (115.214 kg)  05/02/13 258 lb (117.028 kg)  04/27/13 258 lb 11.2 oz (117.346 kg)  04/13/13 249 lb (112.946 kg)  04/06/13 263 lb 4.8 oz (119.432 kg)  04/01/13 257 lb (116.574 kg)  03/24/13 258 lb 12.8 oz (117.391 kg)  03/18/13 262 lb (118.842 kg)  03/04/13 260 lb 14.4 oz (118.343 kg)    Usual Body Weight: approximately 280 lbs  % Usual Body Weight: 91%  BMI:  Body mass index is  43.87 kg/(m^2). Obesity III/Morbid Obesity  Estimated Nutritional Needs: Kcal: 2000-2200 Protein: 100-110 gram Fluid: >/=2100  ml/daily  Skin: WDL  Diet Order: Carb Control  EDUCATION NEEDS: -Education needs addressed   Intake/Output Summary (Last 24 hours) at 05/11/13 1059 Last data filed at 05/11/13 0840  Gross per 24 hour  Intake   1405 ml  Output      0 ml  Net   1405 ml    Last BM: 4/28   Labs:   Recent Labs Lab 05/09/13 0456 05/10/13 0430 05/11/13 0425  NA 137 141 141  K 3.6* 3.6* 3.1*  CL 103 107 105  CO2 17* 18* 21  BUN 8 5* 3*  CREATININE 2.26* 1.25* 1.10  CALCIUM 6.1* 6.3* 6.7*  MG 0.7*  --   --   GLUCOSE 96 122* 113*    CBG (last 3)   Recent Labs  05/10/13 1630 05/10/13 2309 05/11/13 0750  GLUCAP 99 145* 119*    Scheduled Meds: . calcium citrate  200 mg of elemental calcium Oral BID PC  . cloNIDine  0.2 mg Transdermal Weekly  . cyanocobalamin  500 mcg Oral Daily  . cycloSPORINE  1 drop Both Eyes BID  . gabapentin  100 mg Oral TID  . insulin aspart  0-9 Units Subcutaneous TID WC  . levothyroxine  125 mcg Oral QAC breakfast  . pantoprazole  40 mg Oral Daily  . potassium chloride  40 mEq Oral Once  . simvastatin  20 mg Oral q1800  . Vitamin D (Ergocalciferol)  50,000 Units Oral Q7 days    Continuous Infusions: . sodium chloride 10 mL/hr (05/10/13 0822)  . lactated ringers 50 mL/hr at 05/10/13 1408    Past Medical History  Diagnosis Date  . Hyperlipidemia   . Obesity   . Asthma     prn neb. and inhaler  . Osteoarthritis 2003    left knee  . Non-insulin dependent type 2 diabetes mellitus   . Graves' disease   . Ophthalmic manifestation of Graves disease   . Hypertension     on multiple meds., has been on med. > 14 yr.  . Dehydration 04/13/2013  . Breast cancer 01/2013    right    Past Surgical History  Procedure Laterality Date  . Total knee arthroplasty Left 2003  . Total thyroidectomy    . Colonoscopy  2007  . Tubal ligation  1985  . Colonoscopy w/ polypectomy  2009  . Portacath placement Right 02/17/2013    Procedure: INSERTION PORT-A-CATH;   Surgeon: Joyice Faster. Cornett, MD;  Location: [place];  Service: General;  Laterality: Right;    Atlee Abide MS RD LDN Clinical Dietitian [phone]

## 2013-05-11 NOTE — Evaluation (Addendum)
Physical Therapy Evaluation Patient Details Name: [name] MRN: [id number] DOB: 06-Sep-1955 Today's Date: 05/11/2013   History of Present Illness  Pt is a 58 year old female with invasive ductal carcinoma of the breast, undergoing chemotherapy, presented with lightheadedness and dizziness, ongoing watery diarrhea and generalized weakness which has happened with chemotherapy treatments previously; pt has OA of the L knee, plans to have a replacement; PMH hypertension, diabetes mellitus, Graves' disease    Clinical Impression  Pt admitted with lightheadedness, weakness, and diarrhea likely related to chemotherapy treatment.  Pt ambulated in hallway with IV pole with no unsteadiness.  Pt reports distance in hall is further than she would typically walk at home.  Pt uses RW for ambulation outside of home due to OA pain in L knee.  Advised pt to use cane in house until she has regained her strength.  Encouraged pt to walk in hallway during her stay at [facility] as she reports not leaving room since admission.  Pt has been evaluated with no further acute PT needs identified.  PT is signing off.  Thank you for this referral.    Follow Up Recommendations No PT follow up    Equipment Recommendations  None recommended by PT    Recommendations for Other Services       Precautions / Restrictions Precautions Precautions: Fall Restrictions Weight Bearing Restrictions: No      Mobility  Bed Mobility               General bed mobility comments: pt in bathroom upon entering room  Transfers Overall transfer level: Needs assistance   Transfers: Sit to/from Stand Sit to Stand: Supervision         General transfer comment: pt steady with sitting in chair, had correct LE positioning to manage L knee pain  Ambulation/Gait Ambulation/Gait assistance: Supervision Ambulation Distance (Feet): 200 Feet   Gait Pattern/deviations: Step-through pattern;Decreased weight shift to left;Decreased  step length - left;Antalgic Gait velocity: decreased   General Gait Details: pt used IV pole, pt has good balance with ambulation, decreased activity tolerance likely related to current medical condition; gait deviations due to the OA of L knee  Stairs            Wheelchair Mobility    Modified Rankin (Stroke Patients Only)       Balance                                             Pertinent Vitals/Pain Pain in L knee from osteoarthritis.  Activity to tolerance.    Home Living Family/patient expects to be discharged to:: Private residence Living Arrangements: Children Available Help at Discharge: Family Type of Home: House Home Access: Level entry     [place]: [place]: Environmental consultant - 2 wheels;Cane - quad      Prior Function Level of Independence: Independent with assistive device(s)         Comments: pt uses walker when she leaves the house     Hand Dominance        Extremity/Trunk Assessment   Upper Extremity Assessment: Generalized weakness           Lower Extremity Assessment: Generalized weakness;LLE deficits/detail   LLE Deficits / Details: osteoarthritis of the knee; had a L TKA planned prior to breast cancer diagnosis, plans to have replacement when medically ready  Cervical / Trunk Assessment: Normal  Communication   Communication: No difficulties  Cognition Arousal/Alertness: Awake/alert Behavior During Therapy: WFL for tasks assessed/performed Overall Cognitive Status: Within Functional Limits for tasks assessed                      General Comments      Exercises        Assessment/Plan    PT Assessment Patent does not need any further PT services  PT Diagnosis     PT Problem List    PT Treatment Interventions     PT Goals (Current goals can be found in the Care Plan section) Acute Rehab PT Goals PT Goal Formulation: No goals set, d/c therapy    Frequency     Barriers to  discharge        Co-evaluation               End of Session   Activity Tolerance: Patient tolerated treatment well Patient left: in chair;with nursing/sitter in room;with family/visitor present           Time: 1152-1201 PT Time Calculation (min): 9 min   Charges:   PT Evaluation $Initial PT Evaluation Tier I: 1 Procedure PT Treatments $Gait Training: 8-22 mins   PT G CodesJacqulyn Cane 2013/05/27, 12:53 PM Jacqulyn Cane SPT 05-27-2013

## 2013-05-11 NOTE — Discharge Summary (Signed)
Physician Discharge Summary  [name] UEA:[id number] DOB: September 20, 1955 DOA: 05/08/2013  PCP: Rubbie Battiest, MD  Admit date: 05/08/2013 Discharge date: 05/11/2013  Recommendations for Outpatient Follow-up:  1. Pt will need to follow up with PCP in 1 weeks post discharge 2. Please obtain BMP in 1 week 3. Please also check CBC to evaluate Hg and Hct levels   Discharge Diagnoses:  Acute renal failure in the setting of chronic kidney disease stage II  - Due to ongoing diarrhea with poor by mouth intake. Patient has received 2 L of normal saline in the emergency room, continued IV fluids, renal function continues to improve  - Discontinued ACE inhibitors, hydrochlorothiazide -Patient was instructed not to restart her but hasn't brought or HCTZ until she follows up with her primary care provider Hypocalcemia/vitamin D deficiency/ hypothyroidism -Likely due to poor oral intake, diarrhea, and vitamin D deficiency -Patient had a low 25-vitamin D level -Replete  -The patient has had history of thyroidectomy  -Continue Drisdol 50K units once weekly x 8 wks, then vit D, 800 IU daily -Calcium gluconate was not given as ordered previously--regular today  -Patient did not like calcium carbonate, therefore the patient will be continued on calcitrate -Repeat BMP in one week - Have also started her on calcium citrate that she is to take with lunch and dinner. Patient was instructed not to take calcium citrate in the morning since she is on Synthroid and can interfere with the thyroid medication absorption -Patient was treated with intermittent dosing calcium gluconate IV during the hospitalization Generalize weakness  -Likely related to dehydration and hypercalcemia  -Check TSH 12.660--> increase Synthroid to 125 mcg daily last admission on 4/22 -May also be related to UTI  -Symptoms improving with IV fluids  -Recheck TSH in 4-6 weeks  UTI - previously diagnosed. Microbiology shows Escherichia coli  sensitive to ciprofloxacin. Continue ciprofloxacin, last day of treatment is 4/28  Pancytopenia  -Likely due to chemotherapy  -Continue to monitor closely  -Wbc's have improved. Hemoglobin remained stable -- transfused 1U today 4/27.  Diabetes mellitus type 2  -Place on half home dose of Lantus  -NovoLog sliding scale  -Hemoglobin A1c--9.6  -restart metformin  After d/c Invasive ductal carcinoma of the right breast  -04/27/2013--last dose of chemotherapy  -Dr. Humphrey Rolls is pt's oncologist--next appt on 05/18/13 Hypokalemia  -Repleted   diarrhea  -Likely related to the patient's chemotherapy  -Check C. difficile PCR--neg  Hypertension  -Continue labetalol  -Continue clonidine TTS 2 patch  -Continue amlodipine  -hold  Benazepril/HCTZ until f/u with PCP   Discharge Condition: Stable  Disposition:  home  Diet: Carbohydrate modified Wt Readings from Last 3 Encounters:  05/08/13 117.7 kg (259 lb 7.7 oz)  05/05/13 115.214 kg (254 lb)  05/02/13 117.028 kg (258 lb)    History of present illness:  [name] is a 58 y.o. female has a past medical history significant for invasive ductal carcinoma of the breast followed by Dr. Humphrey Rolls from oncology, currently undergoing chemotherapy and just finished cycle 4/6, diabetes, Graves' disease status post thyroidectomy, hyperlipidemia, presents to the emergency room with the chief complaint of lightheadedness and dizziness, ongoing profuse watery diarrhea and generalized weakness. This has happened to her in the past in relationship to her chemotherapy. She was just admitted and discharged 4 days ago with similar symptoms, and on discharge she was feeling a little bit better. She went home and she has had ongoing diarrhea, very poor appetite and decreased by mouth intake.  Patient denies any chest pain, denies any shortness of breath, she has no abdominal pain, endorses minimal nausea. She has poor appetite. She also states that in the last 3-4 days,  she has noticed a decreasing urine output. In the emergency room, patient was found to be in acute renal failure with a creatinine of 3.3 from 1.0 on discharge 4 days ago (she saw her PCP 2 days ago and the creatinine was 1.2). During her previous hospitalization she was diagnosed with a urinary tract infection and was discharged on ciprofloxacin based on sensitivities. She was also found to be persistently hypocalcemic and was started on calcium supplements. It seems like she was supposed to stop the hydrochlorothiazide, however patient has still been taking that and patient has made no changes to her home medication list.     Discharge Exam: Filed Vitals:   05/10/13 2311  BP: 153/82  Pulse: 92  Temp: 98.8 F (37.1 C)  Resp: 18   Filed Vitals:   05/09/13 2131 05/10/13 0628 05/10/13 1416 05/10/13 2311  BP: 144/69 120/73 149/88 153/82  Pulse: 94  79 92  Temp: 98.9 F (37.2 C) 98.4 F (36.9 C) 98.7 F (37.1 C) 98.8 F (37.1 C)  TempSrc: Oral Oral Oral Oral  Resp: 22 20 18 18   Height:      Weight:      SpO2: 99% 97% 100% 98%   General: A&O x 3, NAD, pleasant, cooperative Cardiovascular: RRR, no rub, no gallop, no S3 Respiratory: CTAB, no wheeze, no rhonchi Abdomen:soft, nontender, nondistended, positive bowel sounds Extremities: No edema, No lymphangitis, no petechiae  Discharge Instructions  Discharge Orders   Future Appointments Provider Department Dept Phone   05/18/2013 10:45 AM [facility] [place] [phone]   05/18/2013 11:15 AM Minette Headland, NP [facility] [phone]   05/18/2013 1:30 PM [facility] [phone]   05/19/2013 9:00 AM [facility] [phone]   05/25/2013 8:15 AM [facility] [phone]   05/25/2013 8:45 AM Minette Headland, NP [facility] [phone]   Future Orders Complete By Expires   Diet - low sodium heart healthy  As directed    Discharge instructions  As directed    Increase activity slowly  As directed        Medication List    STOP taking these medications       benazepril 40 MG tablet  Commonly known as:  LOTENSIN     calcium carbonate 500 MG chewable tablet  Commonly known as:  TUMS - dosed in mg elemental calcium     ciprofloxacin 250 MG tablet  Commonly known as:  CIPRO     hydrochlorothiazide 25 MG tablet  Commonly known as:  HYDRODIURIL     ibuprofen 200 MG tablet  Commonly known as:  ADVIL,MOTRIN     vitamin D (CHOLECALCIFEROL) 400 UNITS tablet      TAKE these medications       ACCU-CHEK SMARTVIEW test strip  Generic drug:  glucose blood  USE ONE STRIP TO CHECK GLUCOSE 4 TIMES DAILY     albuterol (5 MG/ML) 0.5% nebulizer solution  Commonly known as:  PROVENTIL  Take 2.5 mg by nebulization every 6 (six) hours as needed for wheezing or shortness of breath.     albuterol 108 (90 BASE) MCG/ACT inhaler  Commonly known as:  PROVENTIL HFA;VENTOLIN HFA  Inhale 2 puffs into the lungs every 4 (four) hours as needed.     amLODipine 10 MG tablet  Commonly known as:  NORVASC  TAKE ONE TABLET BY MOUTH ONCE DAILY.     artificial tears ointment  Place 1 drop into both eyes as needed (for grey eye disease).     aspirin 81 MG tablet  Take 81 mg by mouth daily.     calcium citrate 950 MG tablet  Commonly known as:  CALCITRATE - dosed in mg elemental calcium  Take 1 tablet (200 mg of elemental calcium total) by mouth 2 (two) times daily after a meal.     cloNIDine 0.2 mg/24hr patch  Commonly known as:  CATAPRES - Dosed in mg/24 hr  Place 0.2 mg onto the skin once a week. Mondays.     cycloSPORINE 0.05 % ophthalmic emulsion  Commonly known as:  RESTASIS  Place 1 drop into both eyes 2 (two) times daily.     dexamethasone 4 MG tablet  Commonly known as:   DECADRON  Take 4 mg by mouth as directed. Patient takes day before chemo and three days following chemo     gabapentin 100 MG capsule  Commonly known as:  NEURONTIN  Take 1 capsule (100 mg total) by mouth 3 (three) times daily.     ketoconazole 2 % cream  Commonly known as:  NIZORAL  Apply 1 application topically 2 (two) times daily.     labetalol 300 MG tablet  Commonly known as:  NORMODYNE  TAKE (1) TABLET BY MOUTH TWICE DAILY.     LANTUS SOLOSTAR 100 UNIT/ML Solostar Pen  Generic drug:  Insulin Glargine  Inject 7-18 Units into the skin daily. Per sliding scale.     levothyroxine 125 MCG tablet  Commonly known as:  SYNTHROID, LEVOTHROID  Take 1 tablet (125 mcg total) by mouth daily before breakfast.     metFORMIN 500 MG tablet  Commonly known as:  GLUCOPHAGE  TAKE (1) TABLET BY MOUTH TWICE A DAY WITH MEALS (BREAKFAST AND SUPPER).     omeprazole 20 MG capsule  Commonly known as:  PRILOSEC  Take 1 capsule (20 mg total) by mouth daily.     ondansetron 4 MG disintegrating tablet  Commonly known as:  ZOFRAN ODT  Take 1 tablet (4 mg total) by mouth every 8 (eight) hours as needed for nausea.     pravastatin 40 MG tablet  Commonly known as:  PRAVACHOL  Take 40 mg by mouth 2 (two) times daily at 8 am and 10 pm.     prochlorperazine 10 MG tablet  Commonly known as:  COMPAZINE  Take 10 mg by mouth daily as needed for nausea or vomiting.     vitamin B-12 500 MCG tablet  Commonly known as:  CYANOCOBALAMIN  Take 500 mcg by mouth daily.     Vitamin D (Ergocalciferol) 50000 UNITS Caps capsule  Commonly known as:  DRISDOL  Take 1 capsule (50,000 Units total) by mouth every 7 (seven) days. First dose 05/17/13         The results of significant diagnostics from this hospitalization (including imaging, microbiology, ancillary and laboratory) are listed below for reference.    Significant Diagnostic Studies: Dg Abd Acute W/chest  05/02/2013   CLINICAL DATA:  Nausea and  diarrhea since yesterday. History of breast cancer.  EXAM: ACUTE ABDOMEN SERIES (ABDOMEN 2 VIEW & CHEST 1 VIEW)  COMPARISON:  03/07/2013  FINDINGS: Gas pattern is within normal limits without evidence of ileus, obstruction or free air. No worrisome calcifications. Chronic degenerative change of the spine.  One-view chest shows power port on the right insert it from an internal jugular approach with the tip in the SVC at the azygos level. Heart and mediastinal shadows are normal. The lungs are clear. No free air. Previous thyroidectomy.  IMPRESSION: Negative acute abdominal series.   Electronically Signed   By: Nelson Chimes M.D.   On: 05/02/2013 14:40     Microbiology: Recent Results (from the past 240 hour(s))  URINE CULTURE     Status: None   Collection Time    05/02/13  7:11 PM      Result Value Ref Range Status   Specimen Description URINE, CLEAN CATCH   Final   Special Requests NONE   Final   Culture  Setup Time     Final   Value: 05/02/2013 23:35     Performed at [place]     Final   Value: >=100,000 COLONIES/ML     Performed at [facility]   Culture     Final   Value: ESCHERICHIA COLI     Performed at [facility]   Report Status 05/04/2013 FINAL   Final   Organism ID, Bacteria ESCHERICHIA COLI   Final  CLOSTRIDIUM DIFFICILE BY PCR     Status: None   Collection Time    05/03/13  7:45 PM      Result Value Ref Range Status   C difficile by pcr NEGATIVE  NEGATIVE Final  CLOSTRIDIUM DIFFICILE BY PCR     Status: None   Collection Time    05/08/13  3:08 PM      Result Value Ref Range Status   C difficile by pcr NEGATIVE  NEGATIVE Final   Comment: Performed at [place]: Basic Metabolic Panel:  Recent Labs Lab 05/06/13 1149 05/08/13 1028 05/08/13 1215 05/09/13 0456 05/10/13 0430 05/11/13 0425  NA 140 133*  --  137 141 141  K 3.9 3.9  --  3.6* 3.6* 3.1*  CL 98 94*  --  103 107 105  CO2 21 19  --  17* 18* 21   GLUCOSE 185* 165*  --  96 122* 113*  BUN 6 9  --  8 5* 3*  CREATININE 1.28* 3.33*  --  2.26* 1.25* 1.10  CALCIUM 6.8* 6.7* 6.4* 6.1* 6.3* 6.7*  MG  --   --   --  0.7*  --   --    Liver Function Tests:  Recent Labs Lab 05/08/13 1028 05/09/13 0456 05/11/13 0425  AST 23 19 19   ALT 23 17 14   ALKPHOS 115 91 94  BILITOT 0.4 0.3 0.5  PROT 6.7 5.4* 5.9*  ALBUMIN 3.9 3.0* 3.3*   No results found for this basename: LIPASE, AMYLASE,  in the last 168 hours No results found for this basename: AMMONIA,  in the last 168 hours CBC:  Recent Labs Lab 05/08/13 1028 05/09/13 0456 05/10/13 0430 05/11/13 0425  WBC 6.0 5.4 6.4 5.6  HGB 8.9* 7.3* 8.3* 8.6*  HCT 25.5* 21.2* 24.7* 25.1*  MCV 83.1 84.5 84.6 85.1  PLT 158 127* 137* 138*   Cardiac Enzymes: No results found for this basename: CKTOTAL, CKMB, CKMBINDEX, TROPONINI,  in the last 168 hours BNP: No components found with this basename: POCBNP,  CBG:  Recent Labs Lab  05/10/13 1229 05/10/13 1630 05/10/13 2309 05/11/13 0750 05/11/13 1122  GLUCAP 151* 99 145* 119* 177*    Time coordinating discharge:  Greater than 30 minutes  Signed:  Orson Eva, DO [facility] Pager: [phone] 05/11/2013, 1:43 PM

## 2013-05-16 ENCOUNTER — Encounter (HOSPITAL_COMMUNITY): Payer: Self-pay

## 2013-05-16 ENCOUNTER — Other Ambulatory Visit: Payer: Self-pay

## 2013-05-16 DIAGNOSIS — C50411 Malignant neoplasm of upper-outer quadrant of right female breast: Secondary | ICD-10-CM

## 2013-05-16 MED ORDER — DEXAMETHASONE 4 MG PO TABS: 4.0000 mg | ORAL_TABLET | ORAL | Status: DC

## 2013-05-16 NOTE — Telephone Encounter (Signed)
Spoke with pharmacist at [facility].  Refill should have been sent for total quantity of 16.   Refill order entered for an additional 8.

## 2013-05-16 NOTE — Telephone Encounter (Signed)
Refill request rcvd from [facility].  Per treatment plan, patient has one treatment day left.  Refill sent for 8 tablets, 0 refills.  Receipt confirmed by pharmacy.

## 2013-05-18 ENCOUNTER — Ambulatory Visit (HOSPITAL_BASED_OUTPATIENT_CLINIC_OR_DEPARTMENT_OTHER): Payer: Medicare Other

## 2013-05-18 ENCOUNTER — Other Ambulatory Visit (HOSPITAL_BASED_OUTPATIENT_CLINIC_OR_DEPARTMENT_OTHER): Payer: Medicare Other

## 2013-05-18 ENCOUNTER — Encounter: Payer: Self-pay | Admitting: Adult Health

## 2013-05-18 ENCOUNTER — Ambulatory Visit (HOSPITAL_BASED_OUTPATIENT_CLINIC_OR_DEPARTMENT_OTHER): Payer: Medicare Other | Admitting: Adult Health

## 2013-05-18 ENCOUNTER — Other Ambulatory Visit: Payer: Self-pay | Admitting: Oncology

## 2013-05-18 ENCOUNTER — Telehealth: Payer: Self-pay | Admitting: Oncology

## 2013-05-18 VITALS — BP 144/83 | HR 109 | Temp 99.2°F | Resp 19 | Ht 64.0 in | Wt 259.0 lb

## 2013-05-18 DIAGNOSIS — C50419 Malignant neoplasm of upper-outer quadrant of unspecified female breast: Secondary | ICD-10-CM

## 2013-05-18 DIAGNOSIS — C50411 Malignant neoplasm of upper-outer quadrant of right female breast: Secondary | ICD-10-CM

## 2013-05-18 DIAGNOSIS — G609 Hereditary and idiopathic neuropathy, unspecified: Secondary | ICD-10-CM

## 2013-05-18 LAB — COMPREHENSIVE METABOLIC PANEL (CC13)
ALT: 10 U/L (ref 0–55)
AST: 22 U/L (ref 5–34)
Albumin: 3.6 g/dL (ref 3.5–5.0)
Alkaline Phosphatase: 91 U/L (ref 40–150)
Anion Gap: 19 mEq/L — ABNORMAL HIGH (ref 3–11)
BILIRUBIN TOTAL: 0.44 mg/dL (ref 0.20–1.20)
BUN: 13.2 mg/dL (ref 7.0–26.0)
CO2: 22 mEq/L (ref 22–29)
Calcium: 5.9 mg/dL — CL (ref 8.4–10.4)
Chloride: 106 mEq/L (ref 98–109)
Creatinine: 1.2 mg/dL — ABNORMAL HIGH (ref 0.6–1.1)
Glucose: 212 mg/dl — ABNORMAL HIGH (ref 70–140)
Potassium: 3.5 mEq/L (ref 3.5–5.1)
SODIUM: 147 meq/L — AB (ref 136–145)
TOTAL PROTEIN: 6.9 g/dL (ref 6.4–8.3)

## 2013-05-18 LAB — CBC WITH DIFFERENTIAL/PLATELET
BASO%: 0.1 % (ref 0.0–2.0)
Basophils Absolute: 0 10*3/uL (ref 0.0–0.1)
EOS%: 0.1 % (ref 0.0–7.0)
Eosinophils Absolute: 0 10*3/uL (ref 0.0–0.5)
HCT: 29 % — ABNORMAL LOW (ref 34.8–46.6)
HGB: 9.7 g/dL — ABNORMAL LOW (ref 11.6–15.9)
LYMPH%: 11.3 % — AB (ref 14.0–49.7)
MCH: 30.2 pg (ref 25.1–34.0)
MCHC: 33.6 g/dL (ref 31.5–36.0)
MCV: 90 fL (ref 79.5–101.0)
MONO#: 0.6 10*3/uL (ref 0.1–0.9)
MONO%: 8.8 % (ref 0.0–14.0)
NEUT#: 5 10*3/uL (ref 1.5–6.5)
NEUT%: 79.7 % — ABNORMAL HIGH (ref 38.4–76.8)
Platelets: 246 10*3/uL (ref 145–400)
RBC: 3.22 10*6/uL — AB (ref 3.70–5.45)
RDW: 22.6 % — ABNORMAL HIGH (ref 11.2–14.5)
WBC: 6.3 10*3/uL (ref 3.9–10.3)
lymph#: 0.7 10*3/uL — ABNORMAL LOW (ref 0.9–3.3)

## 2013-05-18 MED ORDER — SODIUM CHLORIDE 0.9 % IV SOLN
2.0000 g | Freq: Once | INTRAVENOUS | Status: AC
  Administered 2013-05-18: 2 g via INTRAVENOUS
  Filled 2013-05-18: qty 20

## 2013-05-18 NOTE — Patient Instructions (Signed)
Hypocalcemia, Adult °Hypocalcemia is low blood calcium. Calcium is important for cells to function in the body. Low blood calcium can cause a variety of symptoms and problems. °CAUSES  °· Low levels of a body protein called albumin. °· Problems with the parathyroid glands or surgical removal of the parathyroid glands. The parathyroid glands maintain the body's level of calcium. °· Decreased production or improper use of parathyroid hormone. °· Lack (deficiency) of vitamin D or magnesium or both. °· Intestinal problems that interfere with nutrient absorption. °· Alcoholism. °· Kidney problems. °· Inflammation of the pancreas (pancreatitis). °· Certain medicines. °· Severe infections (sepsis). °· Infiltrative diseases. With these diseases the parathyroid glands are filled with cells or substances that are not normally present. Examples include: °· Sarcoidosis. °· Hemachromatosis. °· Breakdown of large amounts of muscle fiber. °· High levels of phosphate in the body. °· Cancer. °· Massive blood transfusions which usually occur with severe trauma. °SYMPTOMS  °· Numbness and tingling in the fingers, toes, or around the mouth. °· Muscle aches or cramps, especially in the legs, feet, and back. °· Muscle twitches. °· Shortness of breath or wheezing. °· Difficulty swallowing. °· Changes in the sound of the voice. °· General weakness. °· Fainting. °· Fast heart beats (palpitations). °· Chest pain. °· Irritability. °· Difficulty thinking. °· Memory problems or confusion. °· Severe fatigue. °· Changes in personality. °· Depression and anxiety. °· Shaking uncontrollably (seizures). °· Coarse, brittle hair and nails. °· Dry skin or lasting (chronic) skin diseases (psoriasis, eczema, or dermatitis). °· Clouding of the eye lens (cataracts). °· Abdominal cramping or pain. °DIAGNOSIS  °Hypocalcemia is usually diagnosed through blood tests that reveal a low level of blood calcium. Other tests, such as a recording of the electrical  activity of the heart (electrocardiogram, EKG), may be performed in order to diagnose the underlying cause of the condition. °TREATMENT  °Treatment for hypocalcemia includes giving calcium supplements. These can be given by mouth or by intravenous (IV) access tube, depending on the severity of the symptoms and deficiency. Other minerals (electrolytes), such as magnesium, may also be given. °HOME CARE INSTRUCTIONS  °· Meet with a dietitian to make sure you are eating the most healthful diet possible, or follow diet instructions as directed by your caregiver. °· Follow up with your caregiver as directed. °SEEK IMMEDIATE MEDICAL CARE IF:  °· You develop chest pain. °· You develop persistent rapid or irregular heartbeats. °· You have difficulty breathing. °· You faint. °· You develop increased fatigue. °· You have new swelling in the feet, ankles, or legs. °· You develop increased muscle twitching. °· You start to have seizures. °· You develop confusion. °· You develop mood, memory, or personality changes. °MAKE SURE YOU:  °· Understand these instructions. °· Will watch your condition. °· Will get help right away if you are not doing well or get worse. °Document Released: 06/19/2009 Document Revised: 03/24/2011 Document Reviewed: 06/19/2009 °[employer]® Patient Information ©2014 [employer]. ° °

## 2013-05-18 NOTE — Progress Notes (Signed)
[place]  Telephone:(336) [phone] Fax:(336) [phone]     ID: [name] OB: January 27, 1955  MR#: [id number]  CSN#:[id number]  PCP: Rubbie Battiest, MD GYN:   SU: Dr. Erroll Luna OTHER MD: Dr. Wyline Mood, Dr. Wynelle Link orthopedic surgery  CHIEF COMPLAINT:  Patient is a 58 year old [place] woman with right sided breast cancer here for treatment.    BREAST CANCER HISTORY:  #1 Patient palpated a right breast mass. She had a mammogram that revealed a 1.8 cm lesion in the right breast. There was also noted to be enlarged lymph node by ultrasound. Patient could not tolerate MRI. 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 The biopsied right lymph node was positive for metastatic disease.   #2[name] on neoadjuvant chemotherapy consisting of Taxotere carboplatinum Herceptin/Perjeta beginning 02/22/2013. Total of 6 cycles of therapy is planned.   CURRENT THERAPY:  Docetaxel, Carboplatin, Trastuzumab, Pertuzumab cycle 5 day 1  INTERVAL HISTORY:  Patient is here today for evaluation prior to cycle 5 day 1 of 6 planned cycles of Docetaxel, Carboplatin, Trastuzumab and Pertuzumab.  She receives Neulasta on day 2 for granulocyte support.  She is not feeling very well today.  She has suffered from intense neuropathy for the past 3-4 days.  She was recently hospitalized following her fourth cycle of treatment due to diarrhea, dehydration, and hypocalcemia.  Her vitamin d was checked and it was low.  She is taking Ergocalciferol 50,000 IU weekly.  She is taking a calcium supplement.  Her neuropathy is so severe that she cannot button, write, or balance.  She is taking Gabapentin 128m TID.  She also is experiencing facial numbness and hand cramps as well.  She denies seizures, loss of consciousness, diet changes, vision changes, fevers, chills, nausea, vomiting, constipation, diarrhea, pain, or any further  concerns.    REVIEW OF SYSTEMS: A 10 point review of systems was conducted and is otherwise negative except for what is noted above.     PAST MEDICAL HISTORY: Past Medical History  Diagnosis Date  . Hyperlipidemia   . Obesity   . Asthma     prn neb. and inhaler  . Osteoarthritis 2003    left knee  . Non-insulin dependent type 2 diabetes mellitus   . Graves' disease   . Ophthalmic manifestation of Graves disease   . Hypertension     on multiple meds., has been on med. > 14 yr.  . Dehydration 04/13/2013  . Breast cancer 01/2013    right    PAST SURGICAL HISTORY: Past Surgical History  Procedure Laterality Date  . Total knee arthroplasty Left 2003  . Total thyroidectomy    . Colonoscopy  2007  . Tubal ligation  1985  . Colonoscopy w/ polypectomy  2009  . Portacath placement Right 02/17/2013    Procedure: INSERTION PORT-A-CATH;  Surgeon: TJoyice Faster Cornett, MD;  Location: [facility]  Service: General;  Laterality: Right;    FAMILY HISTORY Family History  Problem Relation Age of Onset  . Heart disease Father   . Lung cancer Father   . Diabetes Mother     HEALTH MAINTENANCE: History  Substance Use Topics  . Smoking status: Former Smoker -- 20 years    Quit date: 02/10/1991  . Smokeless tobacco: Never Used  . Alcohol Use: No      Allergies  Allergen Reactions  . Procardia [Nifedipine] Other (See Comments)    MIGRAINES  Current Outpatient Prescriptions  Medication Sig Dispense Refill  . ACCU-CHEK SMARTVIEW test strip USE ONE STRIP TO CHECK GLUCOSE 4 TIMES DAILY  100 each  5  . amLODipine (NORVASC) 10 MG tablet TAKE ONE TABLET BY MOUTH ONCE DAILY.  90 tablet  0  . Artificial Tear Ointment (ARTIFICIAL TEARS) ointment Place 1 drop into both eyes as needed (for grey eye disease).       Marland Kitchen aspirin 81 MG tablet Take 81 mg by mouth daily.      . calcium citrate (CALCITRATE - DOSED IN MG ELEMENTAL CALCIUM) 950 MG tablet Take 1 tablet (200 mg of elemental  calcium total) by mouth 2 (two) times daily after a meal.      . cloNIDine (CATAPRES - DOSED IN MG/24 HR) 0.2 mg/24hr patch Place 0.2 mg onto the skin once a week. Mondays.      . cycloSPORINE (RESTASIS) 0.05 % ophthalmic emulsion Place 1 drop into both eyes 2 (two) times daily.       Marland Kitchen dexamethasone (DECADRON) 4 MG tablet Take 1 tablet (4 mg total) by mouth as directed. Patient takes day before chemo and three days following chemo  8 tablet  0  . gabapentin (NEURONTIN) 100 MG capsule Take 1 capsule (100 mg total) by mouth 3 (three) times daily.  90 capsule  5  . ketoconazole (NIZORAL) 2 % cream Apply 1 application topically 2 (two) times daily.  60 g  0  . LANTUS SOLOSTAR 100 UNIT/ML Solostar Pen Inject 7-18 Units into the skin daily. Per sliding scale.      . levothyroxine (SYNTHROID, LEVOTHROID) 125 MCG tablet Take 1 tablet (125 mcg total) by mouth daily before breakfast.  30 tablet  1  . metFORMIN (GLUCOPHAGE) 500 MG tablet TAKE (1) TABLET BY MOUTH TWICE A DAY WITH MEALS (BREAKFAST AND SUPPER).  60 tablet  2  . omeprazole (PRILOSEC) 20 MG capsule Take 1 capsule (20 mg total) by mouth daily.  30 capsule  1  . pravastatin (PRAVACHOL) 40 MG tablet Take 40 mg by mouth 2 (two) times daily at 8 am and 10 pm.      . vitamin B-12 (CYANOCOBALAMIN) 500 MCG tablet Take 500 mcg by mouth daily.      . Vitamin D, Ergocalciferol, (DRISDOL) 50000 UNITS CAPS capsule Take 1 capsule (50,000 Units total) by mouth every 7 (seven) days. First dose 05/17/13  7 capsule  0  . albuterol (PROVENTIL HFA;VENTOLIN HFA) 108 (90 BASE) MCG/ACT inhaler Inhale 2 puffs into the lungs every 4 (four) hours as needed.  18 g  2  . albuterol (PROVENTIL) (5 MG/ML) 0.5% nebulizer solution Take 2.5 mg by nebulization every 6 (six) hours as needed for wheezing or shortness of breath.      . labetalol (NORMODYNE) 300 MG tablet TAKE (1) TABLET BY MOUTH TWICE DAILY.  60 tablet  3  . ondansetron (ZOFRAN ODT) 4 MG disintegrating tablet Take 1  tablet (4 mg total) by mouth every 8 (eight) hours as needed for nausea.  10 tablet  0  . prochlorperazine (COMPAZINE) 10 MG tablet Take 10 mg by mouth daily as needed for nausea or vomiting.        No current facility-administered medications for this visit.    OBJECTIVE: Filed Vitals:   05/18/13 1113  BP: 144/83  Pulse: 109  Temp: 99.2 F (37.3 C)  Resp: 19     Body mass index is 44.44 kg/(m^2).    GENERAL: Patient is a well  appearing female in no acute distress, in wheelchair today, stayed seated during exam. HEENT:  Sclerae anicteric.  Oropharynx clear and moist. No ulcerations or evidence of oropharyngeal candidiasis. Neck is supple.  NODES:  No cervical, supraclavicular, or axillary lymphadenopathy palpated.  BREAST EXAM:  Deferred. LUNGS:  Clear to auscultation bilaterally.  No wheezes or rhonchi. HEART:  Regular rate and rhythm. No murmur appreciated. ABDOMEN:  Soft, nontender.  Positive, normoactive bowel sounds. No organomegaly palpated. MSK:  No focal spinal tenderness to palpation. Full range of motion bilaterally in the upper extremities. EXTREMITIES:  No peripheral edema.   SKIN:  Clear with no obvious rashes or skin changes. No nail dyscrasia. NEURO:  Nonfocal. Well oriented.  Appropriate affect. ECOG FS:3 - Symptomatic, >50% confined to bed    LAB RESULTS:  CMP     Component Value Date/Time   NA 147* 05/18/2013 1054   NA 141 05/11/2013 0425   K 3.5 05/18/2013 1054   K 3.1* 05/11/2013 0425   CL 105 05/11/2013 0425   CO2 22 05/18/2013 1054   CO2 21 05/11/2013 0425   GLUCOSE 212* 05/18/2013 1054   GLUCOSE 113* 05/11/2013 0425   BUN 13.2 05/18/2013 1054   BUN 3* 05/11/2013 0425   CREATININE 1.2* 05/18/2013 1054   CREATININE 1.10 05/11/2013 0425   CREATININE 1.28* 05/06/2013 1149   CALCIUM 5.9 Repeated and Verified* 05/18/2013 1054   CALCIUM 6.7* 05/11/2013 0425   CALCIUM 6.4* 05/08/2013 1215   PROT 6.9 05/18/2013 1054   PROT 5.9* 05/11/2013 0425   ALBUMIN 3.6 05/18/2013 1054    ALBUMIN 3.3* 05/11/2013 0425   AST 22 05/18/2013 1054   AST 19 05/11/2013 0425   ALT 10 05/18/2013 1054   ALT 14 05/11/2013 0425   ALKPHOS 91 05/18/2013 1054   ALKPHOS 94 05/11/2013 0425   BILITOT 0.44 05/18/2013 1054   BILITOT 0.5 05/11/2013 0425   GFRNONAA 54* 05/11/2013 0425   GFRAA 63* 05/11/2013 0425    I No results found for this basename: SPEP, UPEP,  kappa and lambda light chains    Lab Results  Component Value Date   WBC 6.3 05/18/2013   NEUTROABS 5.0 05/18/2013   HGB 9.7* 05/18/2013   HCT 29.0* 05/18/2013   MCV 90.0 05/18/2013   PLT 246 05/18/2013      Chemistry      Component Value Date/Time   NA 147* 05/18/2013 1054   NA 141 05/11/2013 0425   K 3.5 05/18/2013 1054   K 3.1* 05/11/2013 0425   CL 105 05/11/2013 0425   CO2 22 05/18/2013 1054   CO2 21 05/11/2013 0425   BUN 13.2 05/18/2013 1054   BUN 3* 05/11/2013 0425   CREATININE 1.2* 05/18/2013 1054   CREATININE 1.10 05/11/2013 0425   CREATININE 1.28* 05/06/2013 1149      Component Value Date/Time   CALCIUM 5.9 Repeated and Verified* 05/18/2013 1054   CALCIUM 6.7* 05/11/2013 0425   CALCIUM 6.4* 05/08/2013 1215   ALKPHOS 91 05/18/2013 1054   ALKPHOS 94 05/11/2013 0425   AST 22 05/18/2013 1054   AST 19 05/11/2013 0425   ALT 10 05/18/2013 1054   ALT 14 05/11/2013 0425   BILITOT 0.44 05/18/2013 1054   BILITOT 0.5 05/11/2013 0425       No results found for this basename: LABCA2    No components found with this basename: [id number]    No results found for this basename: INR,  in the last 168 hours  Urinalysis  Component Value Date/Time   COLORURINE YELLOW 05/02/2013 1911   APPEARANCEUR HAZY* 05/02/2013 1911   LABSPEC 1.015 05/02/2013 1911   PHURINE 7.5 05/02/2013 1911   GLUCOSEU NEGATIVE 05/02/2013 1911   HGBUR MODERATE* 05/02/2013 1911   BILIRUBINUR NEGATIVE 05/02/2013 1911   KETONESUR NEGATIVE 05/02/2013 1911   PROTEINUR NEGATIVE 05/02/2013 1911   UROBILINOGEN 0.2 05/02/2013 1911   NITRITE NEGATIVE 05/02/2013 1911   LEUKOCYTESUR SMALL* 05/02/2013 1911      STUDIES: Dg Abd Acute W/chest  05/02/2013   CLINICAL DATA:  Nausea and diarrhea since yesterday. History of breast cancer.  EXAM: ACUTE ABDOMEN SERIES (ABDOMEN 2 VIEW & CHEST 1 VIEW)  COMPARISON:  03/07/2013  FINDINGS: Gas pattern is within normal limits without evidence of ileus, obstruction or free air. No worrisome calcifications. Chronic degenerative change of the spine.  One-view chest shows power port on the right insert it from an internal jugular approach with the tip in the SVC at the azygos level. Heart and mediastinal shadows are normal. The lungs are clear. No free air. Previous thyroidectomy.  IMPRESSION: Negative acute abdominal series.   Electronically Signed   By: Nelson Chimes M.D.   On: 05/02/2013 14:40    ASSESSMENT: 58 y.o. [place] woman with clinical T1 N1, stage IIA invasive ductal carcinoma, grade II-III, ER 99%, PR 95%, Ki-67 70%, HER-2/neu positive.    1.  Patient began neoadjuvant chemotherapy consisting of Docetaxel, Carboplatin, Trastuzumab, Pertuzumab on 02/22/2013.  She receives this treatment on day 1 of a 21 day cycle with Neulasta given on day 2 for granulocyte support.  A total of 6 cycles are planned.    2. Hypocalcemia: Patient receives intermittent supplementation with Calcium Gluconate.    3. Neuropathy:  Patient is currently taking Gabapentin TID.    PLAN:  Patient is severely hypocalcemic today.  She will not receive treatment.  Instead we will give her 2 grams of Calcium Gluconate and have her return tomorrow for labs and possible treatment.  I will evaluate her about halfway through her treatment to see if her neuropathy is improved.  She states she is taking Calcium as prescribed and Vitamin D weekly.  She denies any recent diarrhea, or any further concerns.    Her last echocardiogram was on 02/21/2013 and demonstrated a LVEF of 55%.  She was evaluated by Dr. Haroldine Laws and cleared for Herceptin therapy with 3 month f/u and echocardiogram  recommended.  The patient will return tomorrow for labs and treatment.   She knows to call us in the interim for any questions or concerns.  We can certainly see her sooner if needed.  I spent 25 minutes counseling the patient face to face.  The total time spent in the appointment was 30 minutes.  Minette Headland, [place] [phone] 05/18/2013 12:09 PM

## 2013-05-18 NOTE — Telephone Encounter (Signed)
, °

## 2013-05-18 NOTE — Patient Instructions (Signed)
Take 1 tums tablet four times per day.  You will receive IV calcium in the treatment room.  We will recheck your labs tomorrow.  Please call us if you have any questions or concerns.

## 2013-05-19 ENCOUNTER — Ambulatory Visit: Payer: Medicare Other

## 2013-05-19 ENCOUNTER — Telehealth: Payer: Self-pay | Admitting: Adult Health

## 2013-05-19 ENCOUNTER — Ambulatory Visit (HOSPITAL_BASED_OUTPATIENT_CLINIC_OR_DEPARTMENT_OTHER): Payer: Medicare Other

## 2013-05-19 ENCOUNTER — Telehealth: Payer: Self-pay | Admitting: Oncology

## 2013-05-19 ENCOUNTER — Other Ambulatory Visit (HOSPITAL_BASED_OUTPATIENT_CLINIC_OR_DEPARTMENT_OTHER): Payer: Medicare Other

## 2013-05-19 VITALS — BP 134/74 | HR 78 | Temp 97.0°F | Resp 20

## 2013-05-19 DIAGNOSIS — C50419 Malignant neoplasm of upper-outer quadrant of unspecified female breast: Secondary | ICD-10-CM

## 2013-05-19 DIAGNOSIS — C50919 Malignant neoplasm of unspecified site of unspecified female breast: Secondary | ICD-10-CM

## 2013-05-19 DIAGNOSIS — C50411 Malignant neoplasm of upper-outer quadrant of right female breast: Secondary | ICD-10-CM

## 2013-05-19 LAB — COMPREHENSIVE METABOLIC PANEL (CC13)
ALBUMIN: 3.5 g/dL (ref 3.5–5.0)
ALT: 6 U/L (ref 0–55)
ANION GAP: 17 meq/L — AB (ref 3–11)
AST: 17 U/L (ref 5–34)
Alkaline Phosphatase: 77 U/L (ref 40–150)
BUN: 10.8 mg/dL (ref 7.0–26.0)
CALCIUM: 6.3 mg/dL — AB (ref 8.4–10.4)
CHLORIDE: 110 meq/L — AB (ref 98–109)
CO2: 24 mEq/L (ref 22–29)
CREATININE: 1.2 mg/dL — AB (ref 0.6–1.1)
GLUCOSE: 185 mg/dL — AB (ref 70–140)
POTASSIUM: 3.3 meq/L — AB (ref 3.5–5.1)
Sodium: 151 mEq/L — ABNORMAL HIGH (ref 136–145)
Total Bilirubin: 0.37 mg/dL (ref 0.20–1.20)
Total Protein: 6.5 g/dL (ref 6.4–8.3)

## 2013-05-19 LAB — CBC WITH DIFFERENTIAL/PLATELET
BASO%: 0.2 % (ref 0.0–2.0)
BASOS ABS: 0 10*3/uL (ref 0.0–0.1)
EOS%: 0.3 % (ref 0.0–7.0)
Eosinophils Absolute: 0 10*3/uL (ref 0.0–0.5)
HEMATOCRIT: 27.5 % — AB (ref 34.8–46.6)
HEMOGLOBIN: 9.1 g/dL — AB (ref 11.6–15.9)
LYMPH%: 32.9 % (ref 14.0–49.7)
MCH: 30 pg (ref 25.1–34.0)
MCHC: 33.2 g/dL (ref 31.5–36.0)
MCV: 90.5 fL (ref 79.5–101.0)
MONO#: 0.6 10*3/uL (ref 0.1–0.9)
MONO%: 10 % (ref 0.0–14.0)
NEUT#: 3.5 10*3/uL (ref 1.5–6.5)
NEUT%: 56.6 % (ref 38.4–76.8)
Platelets: 265 10*3/uL (ref 145–400)
RBC: 3.04 10*6/uL — ABNORMAL LOW (ref 3.70–5.45)
RDW: 22.6 % — ABNORMAL HIGH (ref 11.2–14.5)
WBC: 6.3 10*3/uL (ref 3.9–10.3)
lymph#: 2.1 10*3/uL (ref 0.9–3.3)

## 2013-05-19 LAB — MAGNESIUM (CC13): MAGNESIUM: 0.8 mg/dL — AB (ref 1.5–2.5)

## 2013-05-19 MED ORDER — SODIUM CHLORIDE 0.9 % IJ SOLN
10.0000 mL | INTRAMUSCULAR | Status: DC | PRN
  Administered 2013-05-19: 10 mL via INTRAVENOUS
  Filled 2013-05-19: qty 10

## 2013-05-19 MED ORDER — MAGNESIUM SULFATE 50 % IJ SOLN
INTRAVENOUS | Status: DC
  Administered 2013-05-19: 11:00:00 via INTRAVENOUS
  Filled 2013-05-19: qty 250

## 2013-05-19 MED ORDER — DEXTROSE 5 % IV SOLN
2.0000 g | Freq: Once | INTRAVENOUS | Status: AC
  Administered 2013-05-19: 2 g via INTRAVENOUS
  Filled 2013-05-19: qty 20

## 2013-05-19 MED ORDER — HEPARIN SOD (PORK) LOCK FLUSH 100 UNIT/ML IV SOLN
500.0000 [IU] | Freq: Once | INTRAVENOUS | Status: AC
  Administered 2013-05-19: 500 [IU] via INTRAVENOUS
  Filled 2013-05-19: qty 5

## 2013-05-19 NOTE — Telephone Encounter (Signed)
, °

## 2013-05-19 NOTE — Patient Instructions (Signed)
Hypomagnesemia Magnesium is a common ion (mineral) in the body which is needed for metabolism. It is about how the body handles food and other chemical reactions necessary for life. Only about 2% of the magnesium in our body is found in the blood. When this is low, it is called hypomagnesemia. The blood will measure only a tiny amount of the magnesium in our body. When it is low in our blood, it does not mean that the whole body supply is low. The normal serum concentration ranges from 1.8-2.5 mEq/L. When the level gets to be less than 1.0 mEq/L, a number of problems begin to happen.  CAUSES   Receiving intravenous fluids without magnesium replacement.  Loss of magnesium from the bowel by naso-gastric suction.  Loss of magnesium from nausea and vomiting or severe diarrhea. Any of the inflammatory bowel conditions can cause this.  Abuse of alcohol often leads to low serum magnesium.  An inherited form of magnesium loss happens when the kidneys lose magnesium. This is called familial or primary hypomagnesemia.  Some medications such as diuretics also cause the loss of magnesium. SYMPTOMS  These following problems are worse if the changes in magnesium levels come on suddenly.  Tremor.  Confusion.  Muscle weakness.  Over-sensitive to sights and sounds.  Sensitive reflexes.  Depression.  Muscular fibrillations.  Over-reactivity of the nerves.  Irritability.  Psychosis.  Spasms of the hand muscles.  Tetany (where the muscles go into uncontrollable spasms). DIAGNOSIS  This condition can be diagnosed by blood tests. TREATMENT   In emergency, magnesium can be given intravenously (by vein).  If the condition is less worrisome, it can be corrected by diet. High levels of magnesium are found in green leafy vegetables, peas, beans and nuts among other things. It can also be given through medications by mouth.  If it is being caused by medications, changes can be made.  If  alcohol is a problem, help is available if there are difficulties giving it up. Document Released: 09/25/2004 Document Revised: 03/24/2011 Document Reviewed: 08/20/2007 [facility] Patient Information 2014 [place]. Hypocalcemia, Adult Hypocalcemia is low blood calcium. Calcium is important for cells to function in the body. Low blood calcium can cause a variety of symptoms and problems. CAUSES   Low levels of a body protein called albumin.  Problems with the parathyroid glands or surgical removal of the parathyroid glands. The parathyroid glands maintain the body's level of calcium.  Decreased production or improper use of parathyroid hormone.  Lack (deficiency) of vitamin D or magnesium or both.  Intestinal problems that interfere with nutrient absorption.  Alcoholism.  Kidney problems.  Inflammation of the pancreas (pancreatitis).  Certain medicines.  Severe infections (sepsis).  Infiltrative diseases. With these diseases the parathyroid glands are filled with cells or substances that are not normally present. Examples include:  Sarcoidosis.  Hemachromatosis.  Breakdown of large amounts of muscle fiber.  High levels of phosphate in the body.  Cancer.  Massive blood transfusions which usually occur with severe trauma. SYMPTOMS   Numbness and tingling in the fingers, toes, or around the mouth.  Muscle aches or cramps, especially in the legs, feet, and back.  Muscle twitches.  Shortness of breath or wheezing.  Difficulty swallowing.  Changes in the sound of the voice.  General weakness.  Fainting.  Fast heart beats (palpitations).  Chest pain.  Irritability.  Difficulty thinking.  Memory problems or confusion.  Severe fatigue.  Changes in personality.  Depression and anxiety.  Shaking uncontrollably (  seizures).  Coarse, brittle hair and nails.  Dry skin or lasting (chronic) skin diseases (psoriasis, eczema, or dermatitis).  Clouding  of the eye lens (cataracts).  Abdominal cramping or pain. DIAGNOSIS  Hypocalcemia is usually diagnosed through blood tests that reveal a low level of blood calcium. Other tests, such as a recording of the electrical activity of the heart (electrocardiogram, EKG), may be performed in order to diagnose the underlying cause of the condition. TREATMENT  Treatment for hypocalcemia includes giving calcium supplements. These can be given by mouth or by intravenous (IV) access tube, depending on the severity of the symptoms and deficiency. Other minerals (electrolytes), such as magnesium, may also be given. HOME CARE INSTRUCTIONS   Meet with a dietitian to make sure you are eating the most healthful diet possible, or follow diet instructions as directed by your caregiver.  Follow up with your caregiver as directed. SEEK IMMEDIATE MEDICAL CARE IF:   You develop chest pain.  You develop persistent rapid or irregular heartbeats.  You have difficulty breathing.  You faint.  You develop increased fatigue.  You have new swelling in the feet, ankles, or legs.  You develop increased muscle twitching.  You start to have seizures.  You develop confusion.  You develop mood, memory, or personality changes. MAKE SURE YOU:   Understand these instructions.  Will watch your condition.  Will get help right away if you are not doing well or get worse. Document Released: 06/19/2009 Document Revised: 03/24/2011 Document Reviewed: 06/19/2009 [facility] Patient Information 2014 [place].

## 2013-05-19 NOTE — Telephone Encounter (Signed)
added pt on for tx 5.8.15...helen advise i just add...done.Marland KitchenMarland KitchenKathrine Cords to call pt

## 2013-05-19 NOTE — Progress Notes (Signed)
Upon today's labs, calcium 6.3 and magnesium 0.8. Per Lisabeth Register, NP hold treatment. Patient to receive IVF with additives.

## 2013-05-20 ENCOUNTER — Other Ambulatory Visit (HOSPITAL_BASED_OUTPATIENT_CLINIC_OR_DEPARTMENT_OTHER): Payer: Medicare Other

## 2013-05-20 ENCOUNTER — Telehealth: Payer: Self-pay | Admitting: Oncology

## 2013-05-20 ENCOUNTER — Telehealth: Payer: Self-pay | Admitting: *Deleted

## 2013-05-20 ENCOUNTER — Ambulatory Visit (HOSPITAL_BASED_OUTPATIENT_CLINIC_OR_DEPARTMENT_OTHER): Payer: Medicare Other

## 2013-05-20 ENCOUNTER — Other Ambulatory Visit (HOSPITAL_BASED_OUTPATIENT_CLINIC_OR_DEPARTMENT_OTHER): Payer: Medicare Other | Admitting: Adult Health

## 2013-05-20 ENCOUNTER — Other Ambulatory Visit: Payer: Self-pay | Admitting: Adult Health

## 2013-05-20 ENCOUNTER — Other Ambulatory Visit: Payer: Self-pay | Admitting: *Deleted

## 2013-05-20 ENCOUNTER — Ambulatory Visit: Payer: Medicare Other

## 2013-05-20 VITALS — BP 118/75 | HR 75 | Temp 98.0°F | Resp 18

## 2013-05-20 DIAGNOSIS — C50411 Malignant neoplasm of upper-outer quadrant of right female breast: Secondary | ICD-10-CM

## 2013-05-20 DIAGNOSIS — C50419 Malignant neoplasm of upper-outer quadrant of unspecified female breast: Secondary | ICD-10-CM

## 2013-05-20 DIAGNOSIS — C50919 Malignant neoplasm of unspecified site of unspecified female breast: Secondary | ICD-10-CM

## 2013-05-20 DIAGNOSIS — E860 Dehydration: Secondary | ICD-10-CM

## 2013-05-20 LAB — CBC WITH DIFFERENTIAL/PLATELET
BASO%: 0.2 % (ref 0.0–2.0)
Basophils Absolute: 0 10*3/uL (ref 0.0–0.1)
EOS%: 1.2 % (ref 0.0–7.0)
Eosinophils Absolute: 0.1 10*3/uL (ref 0.0–0.5)
HCT: 28.1 % — ABNORMAL LOW (ref 34.8–46.6)
HGB: 9.4 g/dL — ABNORMAL LOW (ref 11.6–15.9)
LYMPH#: 1.8 10*3/uL (ref 0.9–3.3)
LYMPH%: 26.9 % (ref 14.0–49.7)
MCH: 30.1 pg (ref 25.1–34.0)
MCHC: 33.4 g/dL (ref 31.5–36.0)
MCV: 90.2 fL (ref 79.5–101.0)
MONO#: 0.7 10*3/uL (ref 0.1–0.9)
MONO%: 10.8 % (ref 0.0–14.0)
NEUT%: 60.9 % (ref 38.4–76.8)
NEUTROS ABS: 4.2 10*3/uL (ref 1.5–6.5)
Platelets: 284 10*3/uL (ref 145–400)
RBC: 3.11 10*6/uL — ABNORMAL LOW (ref 3.70–5.45)
RDW: 22.8 % — AB (ref 11.2–14.5)
WBC: 6.8 10*3/uL (ref 3.9–10.3)

## 2013-05-20 LAB — COMPREHENSIVE METABOLIC PANEL (CC13)
ALK PHOS: 81 U/L (ref 40–150)
ALT: 8 U/L (ref 0–55)
AST: 21 U/L (ref 5–34)
Albumin: 3.5 g/dL (ref 3.5–5.0)
Anion Gap: 16 mEq/L — ABNORMAL HIGH (ref 3–11)
BUN: 5.1 mg/dL — ABNORMAL LOW (ref 7.0–26.0)
CALCIUM: 6.7 mg/dL — AB (ref 8.4–10.4)
CO2: 23 mEq/L (ref 22–29)
Chloride: 107 mEq/L (ref 98–109)
Creatinine: 1 mg/dL (ref 0.6–1.1)
Glucose: 153 mg/dl — ABNORMAL HIGH (ref 70–140)
Potassium: 3.2 mEq/L — ABNORMAL LOW (ref 3.5–5.1)
SODIUM: 146 meq/L — AB (ref 136–145)
TOTAL PROTEIN: 6.4 g/dL (ref 6.4–8.3)
Total Bilirubin: 0.48 mg/dL (ref 0.20–1.20)

## 2013-05-20 LAB — VITAMIN D 25 HYDROXY (VIT D DEFICIENCY, FRACTURES): Vit D, 25-Hydroxy: 26 ng/mL — ABNORMAL LOW (ref 30–89)

## 2013-05-20 LAB — MAGNESIUM (CC13): Magnesium: 0.8 mg/dl — CL (ref 1.5–2.5)

## 2013-05-20 MED ORDER — SODIUM CHLORIDE 0.9 % IV SOLN
2.0000 g | Freq: Once | INTRAVENOUS | Status: DC
  Administered 2013-05-20: 2 g via INTRAVENOUS
  Filled 2013-05-20: qty 4

## 2013-05-20 MED ORDER — SODIUM CHLORIDE 0.9 % IJ SOLN
10.0000 mL | INTRAMUSCULAR | Status: DC | PRN
  Administered 2013-05-20: 10 mL via INTRAVENOUS
  Filled 2013-05-20: qty 10

## 2013-05-20 MED ORDER — POTASSIUM CHLORIDE CRYS ER 20 MEQ PO TBCR: 20.0000 meq | EXTENDED_RELEASE_TABLET | Freq: Two times a day (BID) | ORAL | Status: DC

## 2013-05-20 MED ORDER — SODIUM CHLORIDE 0.9 % IV SOLN
2.0000 g | Freq: Once | INTRAVENOUS | Status: DC
  Administered 2013-05-20: 2 g via INTRAVENOUS
  Filled 2013-05-20: qty 20

## 2013-05-20 MED ORDER — HEPARIN SOD (PORK) LOCK FLUSH 100 UNIT/ML IV SOLN
500.0000 [IU] | Freq: Once | INTRAVENOUS | Status: AC
  Administered 2013-05-20: 500 [IU] via INTRAVENOUS
  Filled 2013-05-20: qty 5

## 2013-05-20 NOTE — Telephone Encounter (Signed)
Called pt to inform her of potassium level(3.2) Pt was not home.Left a detailed message to pick up Rx of potassium at pharmacy. Also left instructions how to take medication. I will call pt back on Monday to make sure she has gotten this message. Message to be forwarded to Charlestine Massed, NP.

## 2013-05-20 NOTE — Patient Instructions (Signed)
Hypocalcemia, Adult Hypocalcemia is low blood calcium. Calcium is important for cells to function in the body. Low blood calcium can cause a variety of symptoms and problems. CAUSES   Low levels of a body protein called albumin.  Problems with the parathyroid glands or surgical removal of the parathyroid glands. The parathyroid glands maintain the body's level of calcium.  Decreased production or improper use of parathyroid hormone.  Lack (deficiency) of vitamin D or magnesium or both.  Intestinal problems that interfere with nutrient absorption.  Alcoholism.  Kidney problems.  Inflammation of the pancreas (pancreatitis).  Certain medicines.  Severe infections (sepsis).  Infiltrative diseases. With these diseases the parathyroid glands are filled with cells or substances that are not normally present. Examples include:  Sarcoidosis.  Hemachromatosis.  Breakdown of large amounts of muscle fiber.  High levels of phosphate in the body. Hypomagnesemia Magnesium is a common ion (mineral) in the body which is needed for metabolism. It is about how the body handles food and other chemical reactions necessary for life. Only about 2% of the magnesium in our body is found in the blood. When this is low, it is called hypomagnesemia. The blood will measure only a tiny amount of the magnesium in our body. When it is low in our blood, it does not mean that the whole body supply is low. The normal serum concentration ranges from 1.8-2.5 mEq/L. When the level gets to be less than 1.0 mEq/L, a number of problems begin to happen.  CAUSES  Receiving intravenous fluids without magnesium replacement. Loss of magnesium from the bowel by naso-gastric suction. Loss of magnesium from nausea and vomiting or severe diarrhea. Any of the inflammatory bowel conditions can cause this. Abuse of alcohol often leads to low serum magnesium. An inherited form of magnesium loss happens when the kidneys lose  magnesium. This is called familial or primary hypomagnesemia. Some medications such as diuretics also cause the loss of magnesium. SYMPTOMS  These following problems are worse if the changes in magnesium levels come on suddenly. Tremor. Confusion. Muscle weakness. Over-sensitive to sights and sounds. Sensitive reflexes. Depression. Muscular fibrillations. Over-reactivity of the nerves. Irritability. Psychosis. Spasms of the hand muscles. Tetany (where the muscles go into uncontrollable spasms). DIAGNOSIS  This condition can be diagnosed by blood tests. TREATMENT  In emergency, magnesium can be given intravenously (by vein). If the condition is less worrisome, it can be corrected by diet. High levels of magnesium are found in green leafy vegetables, peas, beans and nuts among other things. It can also be given through medications by mouth. If it is being caused by medications, changes can be made. If alcohol is a problem, help is available if there are difficulties giving it up. Document Released: 09/25/2004 Document Revised: 03/24/2011 Document Reviewed: 08/20/2007 [facility] Patient Information 2014 [place].   Cancer.  Massive blood transfusions which usually occur with severe trauma. SYMPTOMS   Numbness and tingling in the fingers, toes, or around the mouth.  Muscle aches or cramps, especially in the legs, feet, and back.  Muscle twitches.  Shortness of breath or wheezing.  Difficulty swallowing.  Changes in the sound of the voice.  General weakness.  Fainting.  Fast heart beats (palpitations).  Chest pain.  Irritability.  Difficulty thinking.  Memory problems or confusion.  Severe fatigue.  Changes in personality.  Depression and anxiety.  Shaking uncontrollably (seizures).  Coarse, brittle hair and nails.  Dry skin or lasting (chronic) skin diseases (psoriasis, eczema, or dermatitis).  Clouding  of the eye lens (cataracts).  Abdominal  cramping or pain. DIAGNOSIS  Hypocalcemia is usually diagnosed through blood tests that reveal a low level of blood calcium. Other tests, such as a recording of the electrical activity of the heart (electrocardiogram, EKG), may be performed in order to diagnose the underlying cause of the condition. TREATMENT  Treatment for hypocalcemia includes giving calcium supplements. These can be given by mouth or by intravenous (IV) access tube, depending on the severity of the symptoms and deficiency. Other minerals (electrolytes), such as magnesium, may also be given. HOME CARE INSTRUCTIONS   Meet with a dietitian to make sure you are eating the most healthful diet possible, or follow diet instructions as directed by your caregiver.  Follow up with your caregiver as directed. SEEK IMMEDIATE MEDICAL CARE IF:   You develop chest pain.  You develop persistent rapid or irregular heartbeats.  You have difficulty breathing.  You faint.  You develop increased fatigue.  You have new swelling in the feet, ankles, or legs.  You develop increased muscle twitching.  You start to have seizures.  You develop confusion.  You develop mood, memory, or personality changes. MAKE SURE YOU:   Understand these instructions.  Will watch your condition.  Will get help right away if you are not doing well or get worse. Document Released: 06/19/2009 Document Revised: 03/24/2011 Document Reviewed: 06/19/2009 [facility] Patient Information 2014 [place].

## 2013-05-20 NOTE — Telephone Encounter (Signed)
Gave pt lab apptm tfor 5/11 to [facility], she will give appt to pt

## 2013-05-23 ENCOUNTER — Other Ambulatory Visit: Payer: Self-pay | Admitting: Adult Health

## 2013-05-23 ENCOUNTER — Telehealth: Payer: Self-pay | Admitting: *Deleted

## 2013-05-23 ENCOUNTER — Other Ambulatory Visit (HOSPITAL_BASED_OUTPATIENT_CLINIC_OR_DEPARTMENT_OTHER): Payer: Medicare Other

## 2013-05-23 ENCOUNTER — Ambulatory Visit (HOSPITAL_BASED_OUTPATIENT_CLINIC_OR_DEPARTMENT_OTHER): Payer: Medicare Other

## 2013-05-23 ENCOUNTER — Other Ambulatory Visit: Payer: Self-pay | Admitting: *Deleted

## 2013-05-23 VITALS — BP 123/69 | HR 111 | Temp 97.9°F | Resp 19

## 2013-05-23 DIAGNOSIS — C50419 Malignant neoplasm of upper-outer quadrant of unspecified female breast: Secondary | ICD-10-CM

## 2013-05-23 DIAGNOSIS — C50411 Malignant neoplasm of upper-outer quadrant of right female breast: Secondary | ICD-10-CM

## 2013-05-23 LAB — CBC WITH DIFFERENTIAL/PLATELET
BASO%: 0.2 % (ref 0.0–2.0)
Basophils Absolute: 0 10*3/uL (ref 0.0–0.1)
EOS ABS: 0.1 10*3/uL (ref 0.0–0.5)
EOS%: 1.1 % (ref 0.0–7.0)
HEMATOCRIT: 28.6 % — AB (ref 34.8–46.6)
HGB: 9.4 g/dL — ABNORMAL LOW (ref 11.6–15.9)
LYMPH%: 17.4 % (ref 14.0–49.7)
MCH: 30 pg (ref 25.1–34.0)
MCHC: 32.9 g/dL (ref 31.5–36.0)
MCV: 91.3 fL (ref 79.5–101.0)
MONO#: 0.7 10*3/uL (ref 0.1–0.9)
MONO%: 9.2 % (ref 0.0–14.0)
NEUT%: 72.1 % (ref 38.4–76.8)
NEUTROS ABS: 5.2 10*3/uL (ref 1.5–6.5)
PLATELETS: 377 10*3/uL (ref 145–400)
RBC: 3.13 10*6/uL — AB (ref 3.70–5.45)
RDW: 22.3 % — ABNORMAL HIGH (ref 11.2–14.5)
WBC: 7.3 10*3/uL (ref 3.9–10.3)
lymph#: 1.3 10*3/uL (ref 0.9–3.3)

## 2013-05-23 LAB — COMPREHENSIVE METABOLIC PANEL (CC13)
ALK PHOS: 84 U/L (ref 40–150)
ALT: 15 U/L (ref 0–55)
AST: 29 U/L (ref 5–34)
Albumin: 3.4 g/dL — ABNORMAL LOW (ref 3.5–5.0)
Anion Gap: 13 mEq/L — ABNORMAL HIGH (ref 3–11)
BILIRUBIN TOTAL: 0.41 mg/dL (ref 0.20–1.20)
BUN: 8.6 mg/dL (ref 7.0–26.0)
CO2: 24 mEq/L (ref 22–29)
Calcium: 7.3 mg/dL — ABNORMAL LOW (ref 8.4–10.4)
Chloride: 109 mEq/L (ref 98–109)
Creatinine: 1 mg/dL (ref 0.6–1.1)
Glucose: 117 mg/dl (ref 70–140)
Potassium: 4 mEq/L (ref 3.5–5.1)
SODIUM: 146 meq/L — AB (ref 136–145)
TOTAL PROTEIN: 6.5 g/dL (ref 6.4–8.3)

## 2013-05-23 LAB — MAGNESIUM (CC13): MAGNESIUM: 1.1 mg/dL — AB (ref 1.5–2.5)

## 2013-05-23 MED ORDER — HEPARIN SOD (PORK) LOCK FLUSH 100 UNIT/ML IV SOLN
500.0000 [IU] | Freq: Once | INTRAVENOUS | Status: AC
  Administered 2013-05-23: 500 [IU] via INTRAVENOUS
  Filled 2013-05-23: qty 5

## 2013-05-23 MED ORDER — SODIUM CHLORIDE 0.9 % IJ SOLN
10.0000 mL | INTRAMUSCULAR | Status: DC | PRN
  Administered 2013-05-23: 10 mL via INTRAVENOUS
  Filled 2013-05-23: qty 10

## 2013-05-23 MED ORDER — SODIUM CHLORIDE 0.9 % IV SOLN
2.0000 g | Freq: Once | INTRAVENOUS | Status: AC
  Administered 2013-05-23: 2 g via INTRAVENOUS
  Filled 2013-05-23: qty 20

## 2013-05-23 MED ORDER — SODIUM CHLORIDE 0.9 % IV SOLN
2.0000 g | Freq: Once | INTRAVENOUS | Status: AC
  Administered 2013-05-23: 2 g via INTRAVENOUS
  Filled 2013-05-23: qty 4

## 2013-05-23 MED ORDER — SODIUM CHLORIDE 0.9 % IV SOLN
Freq: Once | INTRAVENOUS | Status: AC
Start: 2013-05-23 — End: 2013-05-23
  Administered 2013-05-23: 15:00:00 via INTRAVENOUS

## 2013-05-23 NOTE — Patient Instructions (Signed)
Hypocalcemia, Adult Hypocalcemia is low blood calcium. Calcium is important for cells to function in the body. Low blood calcium can cause a variety of symptoms and problems. CAUSES   Low levels of a body protein called albumin.  Problems with the parathyroid glands or surgical removal of the parathyroid glands. The parathyroid glands maintain the body's level of calcium.  Decreased production or improper use of parathyroid hormone.  Lack (deficiency) of vitamin D or magnesium or both.  Intestinal problems that interfere with nutrient absorption.  Alcoholism.  Kidney problems.  Inflammation of the pancreas (pancreatitis).  Certain medicines.  Severe infections (sepsis).  Infiltrative diseases. With these diseases the parathyroid glands are filled with cells or substances that are not normally present. Examples include:  Sarcoidosis.  Hemachromatosis.  Breakdown of large amounts of muscle fiber.  High levels of phosphate in the body.  Cancer.  Massive blood transfusions which usually occur with severe trauma. SYMPTOMS   Numbness and tingling in the fingers, toes, or around the mouth.  Muscle aches or cramps, especially in the legs, feet, and back.  Muscle twitches.  Shortness of breath or wheezing.  Difficulty swallowing.  Changes in the sound of the voice.  General weakness.  Fainting.  Fast heart beats (palpitations).  Chest pain.  Irritability.  Difficulty thinking.  Memory problems or confusion.  Severe fatigue.  Changes in personality.  Depression and anxiety.  Shaking uncontrollably (seizures).  Coarse, brittle hair and nails.  Dry skin or lasting (chronic) skin diseases (psoriasis, eczema, or dermatitis).  Clouding of the eye lens (cataracts).  Abdominal cramping or pain. DIAGNOSIS  Hypocalcemia is usually diagnosed through blood tests that reveal a low level of blood calcium. Other tests, such as a recording of the electrical  activity of the heart (electrocardiogram, EKG), may be performed in order to diagnose the underlying cause of the condition. TREATMENT  Treatment for hypocalcemia includes giving calcium supplements. These can be given by mouth or by intravenous (IV) access tube, depending on the severity of the symptoms and deficiency. Other minerals (electrolytes), such as magnesium, may also be given. HOME CARE INSTRUCTIONS   Meet with a dietitian to make sure you are eating the most healthful diet possible, or follow diet instructions as directed by your caregiver.  Follow up with your caregiver as directed. SEEK IMMEDIATE MEDICAL CARE IF:   You develop chest pain.  You develop persistent rapid or irregular heartbeats.  You have difficulty breathing.  You faint.  You develop increased fatigue.  You have new swelling in the feet, ankles, or legs.  You develop increased muscle twitching.  You start to have seizures.  You develop confusion.  You develop mood, memory, or personality changes. MAKE SURE YOU:   Understand these instructions.  Will watch your condition.  Will get help right away if you are not doing well or get worse. Document Released: 06/19/2009 Document Revised: 03/24/2011 Document Reviewed: 06/19/2009 [facility] Patient Information 2014 [place]. Hypomagnesemia Magnesium is a common ion (mineral) in the body which is needed for metabolism. It is about how the body handles food and other chemical reactions necessary for life. Only about 2% of the magnesium in our body is found in the blood. When this is low, it is called hypomagnesemia. The blood will measure only a tiny amount of the magnesium in our body. When it is low in our blood, it does not mean that the whole body supply is low. The normal serum concentration ranges from 1.8-2.5 mEq/L. When  the level gets to be less than 1.0 mEq/L, a number of problems begin to happen.  CAUSES   Receiving intravenous fluids  without magnesium replacement.  Loss of magnesium from the bowel by naso-gastric suction.  Loss of magnesium from nausea and vomiting or severe diarrhea. Any of the inflammatory bowel conditions can cause this.  Abuse of alcohol often leads to low serum magnesium.  An inherited form of magnesium loss happens when the kidneys lose magnesium. This is called familial or primary hypomagnesemia.  Some medications such as diuretics also cause the loss of magnesium. SYMPTOMS  These following problems are worse if the changes in magnesium levels come on suddenly.  Tremor.  Confusion.  Muscle weakness.  Over-sensitive to sights and sounds.  Sensitive reflexes.  Depression.  Muscular fibrillations.  Over-reactivity of the nerves.  Irritability.  Psychosis.  Spasms of the hand muscles.  Tetany (where the muscles go into uncontrollable spasms). DIAGNOSIS  This condition can be diagnosed by blood tests. TREATMENT   In emergency, magnesium can be given intravenously (by vein).  If the condition is less worrisome, it can be corrected by diet. High levels of magnesium are found in green leafy vegetables, peas, beans and nuts among other things. It can also be given through medications by mouth.  If it is being caused by medications, changes can be made.  If alcohol is a problem, help is available if there are difficulties giving it up. Document Released: 09/25/2004 Document Revised: 03/24/2011 Document Reviewed: 08/20/2007 [facility] Patient Information 2014 [place].

## 2013-05-23 NOTE — Telephone Encounter (Signed)
Called pt to f/u on Friday's call. Pt did pick up Rx of potassium and has been taking as instructed. Pt verbalized understanding. No further concerns. Message to be forwarded to Charlestine Massed, NP.

## 2013-05-23 NOTE — Progress Notes (Signed)
Patient here for IVF.  Called for orders. 1440 orders received to transfuse.  Patient to wait pending magnesium level to determine next appointment.

## 2013-05-25 ENCOUNTER — Ambulatory Visit (HOSPITAL_BASED_OUTPATIENT_CLINIC_OR_DEPARTMENT_OTHER): Payer: Medicare Other | Admitting: Adult Health

## 2013-05-25 ENCOUNTER — Ambulatory Visit: Payer: Medicare Other

## 2013-05-25 ENCOUNTER — Encounter: Payer: Self-pay | Admitting: Adult Health

## 2013-05-25 ENCOUNTER — Telehealth: Payer: Self-pay | Admitting: Adult Health

## 2013-05-25 ENCOUNTER — Other Ambulatory Visit (HOSPITAL_BASED_OUTPATIENT_CLINIC_OR_DEPARTMENT_OTHER): Payer: Medicare Other

## 2013-05-25 ENCOUNTER — Telehealth: Payer: Self-pay | Admitting: *Deleted

## 2013-05-25 VITALS — BP 146/88 | HR 101 | Temp 98.4°F | Resp 18 | Ht 64.0 in | Wt 256.9 lb

## 2013-05-25 DIAGNOSIS — Z170 Estrogen receptor positive status [ER+]: Secondary | ICD-10-CM

## 2013-05-25 DIAGNOSIS — C50419 Malignant neoplasm of upper-outer quadrant of unspecified female breast: Secondary | ICD-10-CM

## 2013-05-25 DIAGNOSIS — C50411 Malignant neoplasm of upper-outer quadrant of right female breast: Secondary | ICD-10-CM

## 2013-05-25 DIAGNOSIS — G620 Drug-induced polyneuropathy: Secondary | ICD-10-CM

## 2013-05-25 LAB — CBC WITH DIFFERENTIAL/PLATELET
BASO%: 0.4 % (ref 0.0–2.0)
BASOS ABS: 0 10*3/uL (ref 0.0–0.1)
EOS ABS: 0.1 10*3/uL (ref 0.0–0.5)
EOS%: 1.1 % (ref 0.0–7.0)
HEMATOCRIT: 29.5 % — AB (ref 34.8–46.6)
HEMOGLOBIN: 9.7 g/dL — AB (ref 11.6–15.9)
LYMPH#: 1.4 10*3/uL (ref 0.9–3.3)
LYMPH%: 19.5 % (ref 14.0–49.7)
MCH: 30.5 pg (ref 25.1–34.0)
MCHC: 32.9 g/dL (ref 31.5–36.0)
MCV: 92.7 fL (ref 79.5–101.0)
MONO#: 0.7 10*3/uL (ref 0.1–0.9)
MONO%: 9.7 % (ref 0.0–14.0)
NEUT#: 5.1 10*3/uL (ref 1.5–6.5)
NEUT%: 69.3 % (ref 38.4–76.8)
Platelets: 411 10*3/uL — ABNORMAL HIGH (ref 145–400)
RBC: 3.19 10*6/uL — ABNORMAL LOW (ref 3.70–5.45)
RDW: 22.3 % — ABNORMAL HIGH (ref 11.2–14.5)
WBC: 7.4 10*3/uL (ref 3.9–10.3)

## 2013-05-25 LAB — COMPREHENSIVE METABOLIC PANEL (CC13)
ALT: 13 U/L (ref 0–55)
ANION GAP: 14 meq/L — AB (ref 3–11)
AST: 22 U/L (ref 5–34)
Albumin: 3.6 g/dL (ref 3.5–5.0)
Alkaline Phosphatase: 94 U/L (ref 40–150)
BUN: 6.6 mg/dL — AB (ref 7.0–26.0)
CALCIUM: 7.9 mg/dL — AB (ref 8.4–10.4)
CHLORIDE: 107 meq/L (ref 98–109)
CO2: 24 meq/L (ref 22–29)
CREATININE: 1.1 mg/dL (ref 0.6–1.1)
GLUCOSE: 141 mg/dL — AB (ref 70–140)
Potassium: 4.4 mEq/L (ref 3.5–5.1)
Sodium: 145 mEq/L (ref 136–145)
Total Bilirubin: 0.46 mg/dL (ref 0.20–1.20)
Total Protein: 6.8 g/dL (ref 6.4–8.3)

## 2013-05-25 LAB — MAGNESIUM (CC13): MAGNESIUM: 1.6 mg/dL (ref 1.5–2.5)

## 2013-05-25 MED ORDER — LORAZEPAM 0.5 MG PO TABS: 0.5000 mg | ORAL_TABLET | Freq: Three times a day (TID) | ORAL | Status: DC

## 2013-05-25 NOTE — Progress Notes (Signed)
[name]  Telephone:([phone]) [phone] Fax:(336) [phone]     ID: [name] OB: [birth year]  MR#: [id number]  CSN#:[id number]  PCP: Rubbie Battiest, MD GYN:   SU: Dr. Erroll Luna OTHER MD: Dr. Wyline Mood, Dr. 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     BREAST CANCER HISTORY:  #1 Patient palpated a right breast mass. She had a mammogram that revealed a 1.8 cm lesion in the right breast. There was also noted to be enlarged lymph node by ultrasound. Patient could not tolerate MRI. 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 The biopsied right lymph node was positive for metastatic disease.   #2[name] on neoadjuvant chemotherapy consisting of Taxotere carboplatinum Herceptin/Perjeta beginning 02/22/2013. Total of 6 cycles of therapy is planned.   CURRENT THERAPY:  Docetaxel, Carboplatin, Trastuzumab, Pertuzumab cycle 5 day 1  INTERVAL HISTORY:  Patient is here today for evaluation prior to cycle 5 day 1 of 6 planned cycles of Docetaxel, Carboplatin, Trastuzumab and Pertuzumab.  She receives Neulasta on day 2 for granulocyte support.   The patient is here today for evaluation.  Last week she was supposed to get treatment, however she had severe hypocalcemia, tetany, and hypomagnesemia.  Her calcium is back up to an acceptable range.  She received three days of Calcium Gluconate and Magnesium IV.  She was recommended Tums four times per day, and hasn't taken any yet.  She continues to have numbness and tingling in her fingertips and toes.  Her motor function has returned and she is able to button, zip, open water bottles, she does have some balance changes due to the numbness in her feet.  She did experience diarrhea following the  magnesium infusion on Monday and had three episodes of loose stool.  She denies fevers, chills, nausea, vomiting, constipation, vision changes, skin changes, mouth pain, or any further concerns.  Otherwise, she is doing well and a 10 point ROS is negative.    REVIEW OF SYSTEMS: A 10 point review of systems was conducted and is otherwise negative except for what is noted above.     PAST MEDICAL HISTORY: Past Medical History  Diagnosis Date  . Hyperlipidemia   . Obesity   . Asthma     prn neb. and inhaler  . Osteoarthritis 2003    left knee  . Non-insulin dependent type 2 diabetes mellitus   . Graves' disease   . Ophthalmic manifestation of Graves disease   . Hypertension     on multiple meds., has been on med. > 14 yr.  . Dehydration 04/13/2013  . Breast cancer 01/2013    right    PAST SURGICAL HISTORY: Past Surgical History  Procedure Laterality Date  . Total knee arthroplasty Left 2003  . Total thyroidectomy    . Colonoscopy  2007  . Tubal ligation  1985  . Colonoscopy w/ polypectomy  2009  . Portacath placement Right 02/17/2013    Procedure: INSERTION PORT-A-CATH;  Surgeon: Lacey Faster. Cornett, MD;  Location: [facility];  Service: General;  Laterality: Right;    FAMILY HISTORY Family History  Problem Relation Age of Onset  . Heart disease Father   . Lung cancer Father   . Diabetes Mother     HEALTH MAINTENANCE: History  Substance Use Topics  . Smoking status: Former Smoker -- 20 years    Quit date: 02/10/1991  . Smokeless tobacco: Never Used  . Alcohol Use: No      Allergies  Allergen Reactions  . Procardia [Nifedipine] Other (See Comments)    MIGRAINES    Current Outpatient Prescriptions  Medication Sig Dispense Refill  . ACCU-CHEK SMARTVIEW test strip USE ONE STRIP TO CHECK GLUCOSE 4 TIMES DAILY  100 each  5  . amLODipine (NORVASC) 10 MG tablet TAKE ONE TABLET BY MOUTH ONCE DAILY.  90 tablet  0  . Artificial Tear Ointment (ARTIFICIAL  TEARS) ointment Place 1 drop into both eyes as needed (for grey eye disease).       Marland Kitchen aspirin 81 MG tablet Take 81 mg by mouth daily.      . calcium citrate (CALCITRATE - DOSED IN MG ELEMENTAL CALCIUM) 950 MG tablet Take 1 tablet (200 mg of elemental calcium total) by mouth 2 (two) times daily after a meal.      . cloNIDine (CATAPRES - DOSED IN MG/24 HR) 0.2 mg/24hr patch Place 0.2 mg onto the skin once a week. Mondays.      . cycloSPORINE (RESTASIS) 0.05 % ophthalmic emulsion Place 1 drop into both eyes 2 (two) times daily.       Marland Kitchen gabapentin (NEURONTIN) 100 MG capsule Take 1 capsule (100 mg total) by mouth 3 (three) times daily.  90 capsule  5  . labetalol (NORMODYNE) 300 MG tablet TAKE (1) TABLET BY MOUTH TWICE DAILY.  60 tablet  3  . LANTUS SOLOSTAR 100 UNIT/ML Solostar Pen Inject 7-18 Units into the skin daily. Per sliding scale.      . levothyroxine (SYNTHROID, LEVOTHROID) 125 MCG tablet Take 1 tablet (125 mcg total) by mouth daily before breakfast.  30 tablet  1  . metFORMIN (GLUCOPHAGE) 500 MG tablet TAKE (1) TABLET BY MOUTH TWICE A DAY WITH MEALS (BREAKFAST AND SUPPER).  60 tablet  2  . omeprazole (PRILOSEC) 20 MG capsule Take 1 capsule (20 mg total) by mouth daily.  30 capsule  1  . potassium chloride SA (K-DUR,KLOR-CON) 20 MEQ tablet Take 1 tablet (20 mEq total) by mouth 2 (two) times daily.  10 tablet  0  . pravastatin (PRAVACHOL) 40 MG tablet Take 40 mg by mouth 2 (two) times daily at 8 am and 10 pm.      . vitamin B-12 (CYANOCOBALAMIN) 500 MCG tablet Take 500 mcg by mouth daily.      . Vitamin D, Ergocalciferol, (DRISDOL) 50000 UNITS CAPS capsule Take 1 capsule (50,000 Units total) by mouth every 7 (seven) days. First dose 05/17/13  7 capsule  0  . albuterol (PROVENTIL HFA;VENTOLIN HFA) 108 (90 BASE) MCG/ACT inhaler Inhale 2 puffs into the lungs every 4 (four) hours as needed.  18 g  2  . albuterol (PROVENTIL) (5 MG/ML) 0.5% nebulizer solution Take 2.5 mg by nebulization every 6 (six)  hours as needed for wheezing or shortness of breath.      . dexamethasone (DECADRON) 4 MG tablet Take 1 tablet (4 mg total) by mouth as directed. Patient takes day before chemo and three days following chemo  8 tablet  0  . ketoconazole (NIZORAL) 2 % cream Apply 1 application topically 2 (two) times daily.  60 g  0  . ondansetron (ZOFRAN ODT) 4 MG disintegrating tablet Take 1 tablet (4 mg total) by mouth every 8 (eight) hours as needed for nausea.  10 tablet  0  . prochlorperazine (COMPAZINE) 10 MG tablet Take 10 mg by mouth daily as needed for nausea or vomiting.        No current facility-administered medications for this visit.    OBJECTIVE: Filed Vitals:   05/25/13 0846  BP: 146/88  Pulse: 101  Temp: 98.4 F (36.9 C)  Resp: 18     Body mass index is 44.08 kg/(m^2).    GENERAL: Patient is a well appearing female in no acute distress, stayed seated during exam. HEENT:  Sclerae anicteric.  Oropharynx clear and moist. No ulcerations or evidence of oropharyngeal candidiasis. Neck is supple.  NODES:  No cervical, supraclavicular, or axillary lymphadenopathy palpated.  BREAST EXAM:  Deferred. LUNGS:  Clear to auscultation bilaterally.  No wheezes or rhonchi. HEART:  Regular rate and rhythm. No murmur appreciated. ABDOMEN:  Soft, nontender.  Positive, normoactive bowel sounds. No organomegaly palpated. MSK:  No focal spinal tenderness to palpation. Full range of motion bilaterally in the upper extremities. EXTREMITIES:  No peripheral edema.   SKIN:  Clear with no obvious rashes or skin changes. No nail dyscrasia. NEURO:  Nonfocal. Well oriented.  Appropriate affect. ECOG FS:1    LAB RESULTS:  CMP     Component Value Date/Time   NA 146* 05/23/2013 1337   NA 141 05/11/2013 0425   K 4.0 05/23/2013 1337   K 3.1* 05/11/2013 0425   CL 105 05/11/2013 0425   CO2 24 05/23/2013 1337   CO2 21 05/11/2013 0425   GLUCOSE 117 05/23/2013 1337   GLUCOSE 113* 05/11/2013 0425   BUN 8.6 05/23/2013 1337    BUN 3* 05/11/2013 0425   CREATININE 1.0 05/23/2013 1337   CREATININE 1.10 05/11/2013 0425   CREATININE 1.28* 05/06/2013 1149   CALCIUM 7.3* 05/23/2013 1337   CALCIUM 6.7* 05/11/2013 0425   CALCIUM 6.4* 05/08/2013 1215   PROT 6.5 05/23/2013 1337   PROT 5.9* 05/11/2013 0425   ALBUMIN 3.4* 05/23/2013 1337   ALBUMIN 3.3* 05/11/2013 0425   AST 29 05/23/2013 1337   AST 19 05/11/2013 0425   ALT 15 05/23/2013 1337   ALT 14 05/11/2013 0425   ALKPHOS 84 05/23/2013 1337   ALKPHOS 94 05/11/2013 0425   BILITOT 0.41 05/23/2013 1337   BILITOT 0.5 05/11/2013 0425   GFRNONAA 54* 05/11/2013 0425   GFRAA 63* 05/11/2013 0425    I No results found for this basename: SPEP,  UPEP,   kappa and lambda light chains    Lab Results  Component Value Date   WBC 7.4 05/25/2013   NEUTROABS 5.1 05/25/2013   HGB 9.7* 05/25/2013   HCT 29.5* 05/25/2013   MCV 92.7 05/25/2013   PLT 411* 05/25/2013      Chemistry      Component Value Date/Time   NA 146* 05/23/2013 1337   NA 141 05/11/2013 0425   K 4.0 05/23/2013 1337   K 3.1* 05/11/2013 0425   CL 105 05/11/2013 0425   CO2 24 05/23/2013 1337   CO2 21 05/11/2013 0425   BUN 8.6 05/23/2013 1337   BUN 3* 05/11/2013 0425   CREATININE 1.0 05/23/2013 1337   CREATININE 1.10 05/11/2013 0425   CREATININE 1.28* 05/06/2013 1149      Component Value Date/Time   CALCIUM 7.3* 05/23/2013 1337   CALCIUM 6.7* 05/11/2013 0425   CALCIUM 6.4* 05/08/2013 1215   ALKPHOS 84 05/23/2013 1337   ALKPHOS 94 05/11/2013 0425   AST 29 05/23/2013 1337   AST 19 05/11/2013 0425   ALT 15  05/23/2013 1337   ALT 14 05/11/2013 0425   BILITOT 0.41 05/23/2013 1337   BILITOT 0.5 05/11/2013 0425       No results found for this basename: LABCA2    No components found with this basename: [id number]    No results found for this basename: INR,  in the last 168 hours  Urinalysis    Component Value Date/Time   COLORURINE YELLOW 05/02/2013 1911   APPEARANCEUR HAZY* 05/02/2013 1911   LABSPEC 1.015 05/02/2013 1911   PHURINE  7.5 05/02/2013 1911   GLUCOSEU NEGATIVE 05/02/2013 1911   HGBUR MODERATE* 05/02/2013 1911   BILIRUBINUR NEGATIVE 05/02/2013 1911   KETONESUR NEGATIVE 05/02/2013 1911   PROTEINUR NEGATIVE 05/02/2013 1911   UROBILINOGEN 0.2 05/02/2013 1911   NITRITE NEGATIVE 05/02/2013 1911   LEUKOCYTESUR SMALL* 05/02/2013 1911    STUDIES: Dg Abd Acute W/chest  05/02/2013   CLINICAL DATA:  Nausea and diarrhea since yesterday. History of breast cancer.  EXAM: ACUTE ABDOMEN SERIES (ABDOMEN 2 VIEW & CHEST 1 VIEW)  COMPARISON:  03/07/2013  FINDINGS: Gas pattern is within normal limits without evidence of ileus, obstruction or free air. No worrisome calcifications. Chronic degenerative change of the spine.  One-view chest shows power port on the right insert it from an internal jugular approach with the tip in the SVC at the azygos level. Heart and mediastinal shadows are normal. The lungs are clear. No free air. Previous thyroidectomy.  IMPRESSION: Negative acute abdominal series.   Electronically Signed   By: Nelson Chimes M.D.   On: 05/02/2013 14:40    ASSESSMENT: 57 y.o. 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    1.  Patient began neoadjuvant chemotherapy consisting of Docetaxel, Carboplatin, Trastuzumab, Pertuzumab on 02/22/2013.  She receives this treatment on day 1 of a 21 day cycle with Neulasta given on day 2 for granulocyte support.  A total of 6 cycles are planned.    2. Hypocalcemia: Patient receives intermittent supplementation with Calcium Gluconate.    3. Neuropathy:  Patient is currently taking Gabapentin TID.    PLAN: Patient is here for evaluation prior to cycle 5 of Doctaxel, Carboplatin, Trastuzumab, Pertuzumab.  She is not feeling near as much neuropathy as she did when her calcium was 5.9.  She is up to 7.9 today.  She is however continuing to have numbness that is in her feet and giving her balance changes.   Due to this she will not receive any further chemotherapy with Docetaxel and Carboplatin.  She will receive Trastuzumab and Pertuzumab only today.  We will get a repeat MRI of her breasts to evaluate her response to neoadjuvant chemotherapy and see her back for evaluation and planning.  I encouraged her to continue taking Gabapentin TID for her neuropathy.    Her last echocardiogram was on 02/21/2013 and demonstrated a LVEF of 55%.  She was evaluated by Dr. Haroldine Laws and cleared for Herceptin therapy with 3 month f/u and echocardiogram recommended.  She has f/u and an echocardiogram with Dr. Haroldine Laws scheduled on 06/01/13.    The patient will f/u on 06/08/13 with Dr. Jana Hakim.   She knows to call us in the interim for any questions or concerns.  We can certainly see her sooner if needed.  I spent 25 minutes counseling the patient face to face.  The total time spent in the appointment was 30 minutes.  Minette Headland, NP Medical Oncology  [place] [phone] 05/25/2013 9:09 AM

## 2013-05-25 NOTE — Telephone Encounter (Signed)
cld pt daugther shannon and spoke with her and gave time for appt on 5/14

## 2013-05-25 NOTE — Telephone Encounter (Signed)
Per staff message and POF I have scheduled appts.  JMW  

## 2013-05-26 ENCOUNTER — Ambulatory Visit (HOSPITAL_BASED_OUTPATIENT_CLINIC_OR_DEPARTMENT_OTHER): Payer: Medicare Other

## 2013-05-26 VITALS — BP 126/75 | HR 82 | Temp 97.9°F

## 2013-05-26 DIAGNOSIS — Z5112 Encounter for antineoplastic immunotherapy: Secondary | ICD-10-CM

## 2013-05-26 DIAGNOSIS — C50419 Malignant neoplasm of upper-outer quadrant of unspecified female breast: Secondary | ICD-10-CM

## 2013-05-26 DIAGNOSIS — C50411 Malignant neoplasm of upper-outer quadrant of right female breast: Secondary | ICD-10-CM

## 2013-05-26 MED ORDER — ACETAMINOPHEN 325 MG PO TABS
ORAL_TABLET | ORAL | Status: AC
  Filled 2013-05-26: qty 2

## 2013-05-26 MED ORDER — DIPHENHYDRAMINE HCL 25 MG PO CAPS
50.0000 mg | ORAL_CAPSULE | Freq: Once | ORAL | Status: AC
  Administered 2013-05-26: 50 mg via ORAL

## 2013-05-26 MED ORDER — SODIUM CHLORIDE 0.9 % IJ SOLN
10.0000 mL | INTRAMUSCULAR | Status: DC | PRN
Start: 2013-05-26 — End: 2013-05-26
  Administered 2013-05-26: 10 mL
  Filled 2013-05-26: qty 10

## 2013-05-26 MED ORDER — HEPARIN SOD (PORK) LOCK FLUSH 100 UNIT/ML IV SOLN
500.0000 [IU] | Freq: Once | INTRAVENOUS | Status: AC | PRN
  Administered 2013-05-26: 500 [IU]
  Filled 2013-05-26: qty 5

## 2013-05-26 MED ORDER — OXYCODONE-ACETAMINOPHEN 5-325 MG PO TABS
ORAL_TABLET | ORAL | Status: AC
  Filled 2013-05-26: qty 1

## 2013-05-26 MED ORDER — DIPHENHYDRAMINE HCL 25 MG PO CAPS
ORAL_CAPSULE | ORAL | Status: AC
  Filled 2013-05-26: qty 1

## 2013-05-26 MED ORDER — SODIUM CHLORIDE 0.9 % IV SOLN
420.0000 mg | Freq: Once | INTRAVENOUS | Status: AC
  Administered 2013-05-26: 420 mg via INTRAVENOUS
  Filled 2013-05-26: qty 14

## 2013-05-26 MED ORDER — ACETAMINOPHEN 325 MG PO TABS
650.0000 mg | ORAL_TABLET | Freq: Once | ORAL | Status: AC
  Administered 2013-05-26: 650 mg via ORAL

## 2013-05-26 MED ORDER — DIPHENHYDRAMINE HCL 25 MG PO CAPS
ORAL_CAPSULE | ORAL | Status: AC
  Filled 2013-05-26: qty 2

## 2013-05-26 MED ORDER — OXYCODONE-ACETAMINOPHEN 5-325 MG PO TABS
1.0000 | ORAL_TABLET | Freq: Once | ORAL | Status: AC
  Administered 2013-05-26: 1 via ORAL

## 2013-05-26 MED ORDER — TRASTUZUMAB CHEMO INJECTION 440 MG
6.0000 mg/kg | Freq: Once | INTRAVENOUS | Status: AC
  Administered 2013-05-26: 756 mg via INTRAVENOUS
  Filled 2013-05-26: qty 36

## 2013-05-26 MED ORDER — SODIUM CHLORIDE 0.9 % IV SOLN
Freq: Once | INTRAVENOUS | Status: AC
  Administered 2013-05-26: 13:00:00 via INTRAVENOUS

## 2013-05-26 NOTE — Progress Notes (Signed)
Patient completed 30 minutes observation period post perjeta.

## 2013-05-26 NOTE — Patient Instructions (Addendum)
[facility] Discharge Instructions for Patients Receiving Chemotherapy  Today you received the following chemotherapy agents Herceptin and Perjecta. To help prevent nausea and vomiting after your treatment, we encourage you to take your nausea medication as directed. If you develop nausea and vomiting that is not controlled by your nausea medication, call the clinic.   BELOW ARE SYMPTOMS THAT SHOULD BE REPORTED IMMEDIATELY:  *FEVER GREATER THAN 100.5 F  *CHILLS WITH OR WITHOUT FEVER  NAUSEA AND VOMITING THAT IS NOT CONTROLLED WITH YOUR NAUSEA MEDICATION  *UNUSUAL SHORTNESS OF BREATH  *UNUSUAL BRUISING OR BLEEDING  TENDERNESS IN MOUTH AND THROAT WITH OR WITHOUT PRESENCE OF ULCERS  *URINARY PROBLEMS  *BOWEL PROBLEMS  UNUSUAL RASH Items with * indicate a potential emergency and should be followed up as soon as possible.  Feel free to call the clinic you have any questions or concerns. The clinic phone number is [phone].

## 2013-05-28 ENCOUNTER — Telehealth: Payer: Self-pay | Admitting: Oncology

## 2013-05-28 NOTE — Telephone Encounter (Signed)
lvm for pt regarding to May and June appt....mailed pt appt sched/avs and letter °

## 2013-06-01 ENCOUNTER — Encounter (HOSPITAL_COMMUNITY): Payer: Medicare Other

## 2013-06-01 ENCOUNTER — Ambulatory Visit (HOSPITAL_COMMUNITY): Payer: Medicare Other

## 2013-06-03 ENCOUNTER — Encounter: Payer: Self-pay | Admitting: Oncology

## 2013-06-03 ENCOUNTER — Ambulatory Visit
Admission: RE | Admit: 2013-06-03 | Discharge: 2013-06-03 | Disposition: A | Discharge status: Home / Self Care | Payer: Medicare Other | Source: Ambulatory Visit | Attending: Adult Health | Admitting: Adult Health

## 2013-06-03 DIAGNOSIS — C50411 Malignant neoplasm of upper-outer quadrant of right female breast: Secondary | ICD-10-CM

## 2013-06-03 MED ORDER — GADOBENATE DIMEGLUMINE 529 MG/ML IV SOLN
20.0000 mL | Freq: Once | INTRAVENOUS | Status: AC | PRN
  Administered 2013-06-03: 20 mL via INTRAVENOUS

## 2013-06-03 NOTE — Progress Notes (Signed)
$  [place] for patient

## 2013-06-08 ENCOUNTER — Telehealth: Payer: Self-pay | Admitting: Oncology

## 2013-06-08 ENCOUNTER — Telehealth: Payer: Self-pay | Admitting: *Deleted

## 2013-06-08 ENCOUNTER — Ambulatory Visit (HOSPITAL_BASED_OUTPATIENT_CLINIC_OR_DEPARTMENT_OTHER): Payer: Medicare Other | Admitting: Oncology

## 2013-06-08 VITALS — BP 120/78 | HR 99 | Temp 98.8°F | Resp 18 | Ht 64.0 in | Wt 246.9 lb

## 2013-06-08 DIAGNOSIS — Z170 Estrogen receptor positive status [ER+]: Secondary | ICD-10-CM

## 2013-06-08 DIAGNOSIS — C50411 Malignant neoplasm of upper-outer quadrant of right female breast: Secondary | ICD-10-CM

## 2013-06-08 DIAGNOSIS — C50419 Malignant neoplasm of upper-outer quadrant of unspecified female breast: Secondary | ICD-10-CM

## 2013-06-08 DIAGNOSIS — E119 Type 2 diabetes mellitus without complications: Secondary | ICD-10-CM

## 2013-06-08 DIAGNOSIS — J45909 Unspecified asthma, uncomplicated: Secondary | ICD-10-CM

## 2013-06-08 NOTE — Progress Notes (Signed)
[place]  [phone] [phone]     ID: [name] OB: 24-Jul-1955  MR#: [id number]  GTX#:[id number]  PCP: Rubbie Battiest, MD GYN:   SU: Dr. Erroll Luna OTHER MD: Dr. Thea Silversmith  CHIEF COMPLAINT:  Locally advanced breast cancer TREATMENT: Receiving anti-HER-2 immunotherapy; to start radiation therapy  BREAST CANCER HISTORY: The patient herself noted a change in her right breast November 2014, but did not tell her family until after Christmas at the year. They "pretty much forced me" to have a mammogram and bilateral diagnostic mammography and right ultrasound 01/19/2013 at the breast Center showed an irregular mass associated with pleomorphic calcifications in the upper-outer quadrant of the right breast measuring 1.6 cm. There was an abnormal appearing right axillary lymph node. On physical exam, there was a movable firm palpable mass in the right breast measuring approximately 2 cm by palpation. Ultrasound showed this to be an irregularly marginated hypoechoic mass measuring 1.8 cm. In the right axilla the ultrasound showed a 1.3 cm abnormal appearing level I lymph node (loss a fatty hilum).  The 01/26/2013 the patient underwent biopsy of both the right breast mass in question and a suspicious right axillary lymph node. This showed ([id number]) both the breast mass and axillary lymph node 2 be positive for invasive ductal carcinoma, grade 2 or 3, estrogen receptor 99% positive, progesterone receptor 95% positive, both with strong staining intensity, with an MIB-1 of 70%, and HER-2 amplified at 3+.  The patient was unable to undergo MRI because of claustrophobia concerns. Her subsequent history is as detailed below.  INTERVAL HISTORY: [name] returns today for followup of her breast cancer accompanied by her daughter [name]. The patient had been receiving Docetaxel, Carboplatin, Trastuzumab and Pertuzumab every 3 weeks, with Neulasta support, and  completed 4 cycles at that point her treatment had to be interrupted because of acute renal failure, multiple electrolyte abnormalities, and cytopenias requiring admission 05/08/2013. With supportive care the patient's condition returned to baseline and she resumed anti-HER-2 treatment only 05/26/2013 (pertuzumab and trastuzumab). She is establishing herself on my service today  REVIEW OF SYSTEMS: The main problem she had from chemotherapy were fatigue and diarrhea. Even though the pertuzumab is being continued, the diarrhea has now "not as bad". She tolerated her most recent cycle "fine". She sometimes forgets to take her Tums to keep up her calcium level. She denies any symptoms suggestive of heart failure, rash, fever, or unusual pain. She does have some abdominal cramps, and chronic stress urinary incontinence. She has mild peripheral neuropathy. A detailed review of systems today was otherwise noncontributory  PAST MEDICAL HISTORY: Past Medical History  Diagnosis Date  . Hyperlipidemia   . Obesity   . Asthma     prn neb. and inhaler  . Osteoarthritis 2003    left knee  . Non-insulin dependent type 2 diabetes mellitus   . Graves' disease   . Ophthalmic manifestation of Graves disease   . Hypertension     on multiple meds., has been on med. > 14 yr.  . Dehydration 04/13/2013  . Breast cancer 01/2013    right    PAST SURGICAL HISTORY: Past Surgical History  Procedure Laterality Date  . Total knee arthroplasty Left 2003  . Total thyroidectomy    . Colonoscopy  2007  . Tubal ligation  1985  . Colonoscopy w/ polypectomy  2009  . Portacath placement Right 02/17/2013    Procedure: INSERTION PORT-A-CATH;  Surgeon: Joyice Faster. Cornett, MD;  Location: [facility];  Service: General;  Laterality: Right;    FAMILY HISTORY Family History  Problem Relation Age of Onset  . Heart disease Father   . Lung cancer Father   . Diabetes Mother    GYN HISTORY:  SOCIAL  HISTORY:  HEALTH MAINTENANCE: History  Substance Use Topics  . Smoking status: Former Smoker -- 20 years    Quit date: 02/10/1991  . Smokeless tobacco: Never Used  . Alcohol Use: No      Allergies  Allergen Reactions  . Procardia [Nifedipine] Other (See Comments)    MIGRAINES    Current Outpatient Prescriptions  Medication Sig Dispense Refill  . ACCU-CHEK SMARTVIEW test strip USE ONE STRIP TO CHECK GLUCOSE 4 TIMES DAILY  100 each  5  . albuterol (PROVENTIL HFA;VENTOLIN HFA) 108 (90 BASE) MCG/ACT inhaler Inhale 2 puffs into the lungs every 4 (four) hours as needed.  18 g  2  . albuterol (PROVENTIL) (5 MG/ML) 0.5% nebulizer solution Take 2.5 mg by nebulization every 6 (six) hours as needed for wheezing or shortness of breath.      Marland Kitchen amLODipine (NORVASC) 10 MG tablet TAKE ONE TABLET BY MOUTH ONCE DAILY.  90 tablet  0  . Artificial Tear Ointment (ARTIFICIAL TEARS) ointment Place 1 drop into both eyes as needed (for grey eye disease).       Marland Kitchen aspirin 81 MG tablet Take 81 mg by mouth daily.      . calcium citrate (CALCITRATE - DOSED IN MG ELEMENTAL CALCIUM) 950 MG tablet Take 1 tablet (200 mg of elemental calcium total) by mouth 2 (two) times daily after a meal.      . cloNIDine (CATAPRES - DOSED IN MG/24 HR) 0.2 mg/24hr patch Place 0.2 mg onto the skin once a week. Mondays.      . cycloSPORINE (RESTASIS) 0.05 % ophthalmic emulsion Place 1 drop into both eyes 2 (two) times daily.       Marland Kitchen dexamethasone (DECADRON) 4 MG tablet Take 1 tablet (4 mg total) by mouth as directed. Patient takes day before chemo and three days following chemo  8 tablet  0  . gabapentin (NEURONTIN) 100 MG capsule Take 1 capsule (100 mg total) by mouth 3 (three) times daily.  90 capsule  5  . ketoconazole (NIZORAL) 2 % cream Apply 1 application topically 2 (two) times daily.  60 g  0  . labetalol (NORMODYNE) 300 MG tablet TAKE (1) TABLET BY MOUTH TWICE DAILY.  60 tablet  3  . LANTUS SOLOSTAR 100 UNIT/ML Solostar Pen  Inject 7-18 Units into the skin daily. Per sliding scale.      . levothyroxine (SYNTHROID, LEVOTHROID) 125 MCG tablet Take 1 tablet (125 mcg total) by mouth daily before breakfast.  30 tablet  1  . LORazepam (ATIVAN) 0.5 MG tablet Take 1 tablet (0.5 mg total) by mouth every 8 (eight) hours.  30 tablet  0  . metFORMIN (GLUCOPHAGE) 500 MG tablet TAKE (1) TABLET BY MOUTH TWICE A DAY WITH MEALS (BREAKFAST AND SUPPER).  60 tablet  2  . omeprazole (PRILOSEC) 20 MG capsule Take 1 capsule (20 mg total) by mouth daily.  30 capsule  1  . ondansetron (ZOFRAN ODT) 4 MG disintegrating tablet Take 1 tablet (4 mg total) by mouth every 8 (eight) hours as needed for nausea.  10 tablet  0  . potassium chloride SA (K-DUR,KLOR-CON) 20 MEQ tablet Take 1 tablet (20 mEq total) by mouth 2 (two) times daily.  10 tablet  0  . pravastatin (PRAVACHOL) 40 MG tablet Take 40 mg by mouth 2 (two) times daily at 8 am and 10 pm.      . prochlorperazine (COMPAZINE) 10 MG tablet Take 10 mg by mouth daily as needed for nausea or vomiting.       . vitamin B-12 (CYANOCOBALAMIN) 500 MCG tablet Take 500 mcg by mouth daily.      . Vitamin D, Ergocalciferol, (DRISDOL) 50000 UNITS CAPS capsule Take 1 capsule (50,000 Units total) by mouth every 7 (seven) days. First dose 05/17/13  7 capsule  0   No current facility-administered medications for this visit.    OBJECTIVE: Middle-aged [place] American woman who appears older than stated age 58 Vitals:   06/08/13 1400  BP: 120/78  Pulse: 99  Temp: 98.8 F (37.1 C)  Resp: 18     Body mass index is 42.36 kg/(m^2).     Sclerae unicteric, pupils equal and reactive Oropharynx clear and moist, full upper plate No cervical or supraclavicular adenopathy Lungs no rales or rhonchi Heart regular rate and rhythm Abd soft, obese, nontender, positive bowel sounds MSK no focal spinal tenderness, no upper extremity lymphedema Neuro: nonfocal, well oriented, friendly affect Breasts: I do not  palpate a mass in the right breast. There are no skin or nipple changes of concern. The right axilla is benign. Left breast is unremarkable.  LAB RESULTS:  CMP     Component Value Date/Time   NA 145 05/25/2013 0826   NA 141 05/11/2013 0425   K 4.4 05/25/2013 0826   K 3.1* 05/11/2013 0425   CL 105 05/11/2013 0425   CO2 24 05/25/2013 0826   CO2 21 05/11/2013 0425   GLUCOSE 141* 05/25/2013 0826   GLUCOSE 113* 05/11/2013 0425   BUN 6.6* 05/25/2013 0826   BUN 3* 05/11/2013 0425   CREATININE 1.1 05/25/2013 0826   CREATININE 1.10 05/11/2013 0425   CREATININE 1.28* 05/06/2013 1149   CALCIUM 7.9* 05/25/2013 0826   CALCIUM 6.7* 05/11/2013 0425   CALCIUM 6.4* 05/08/2013 1215   PROT 6.8 05/25/2013 0826   PROT 5.9* 05/11/2013 0425   ALBUMIN 3.6 05/25/2013 0826   ALBUMIN 3.3* 05/11/2013 0425   AST 22 05/25/2013 0826   AST 19 05/11/2013 0425   ALT 13 05/25/2013 0826   ALT 14 05/11/2013 0425   ALKPHOS 94 05/25/2013 0826   ALKPHOS 94 05/11/2013 0425   BILITOT 0.46 05/25/2013 0826   BILITOT 0.5 05/11/2013 0425   GFRNONAA 54* 05/11/2013 0425   GFRAA 63* 05/11/2013 0425    I No results found for this basename: SPEP,  UPEP,   kappa and lambda light chains    Lab Results  Component Value Date   WBC 7.4 05/25/2013   NEUTROABS 5.1 05/25/2013   HGB 9.7* 05/25/2013   HCT 29.5* 05/25/2013   MCV 92.7 05/25/2013   PLT 411* 05/25/2013      Chemistry      Component Value Date/Time   NA 145 05/25/2013 0826   NA 141 05/11/2013 0425   K 4.4 05/25/2013 0826   K 3.1* 05/11/2013 0425   CL 105 05/11/2013 0425   CO2 24 05/25/2013 0826   CO2 21 05/11/2013 0425   BUN 6.6* 05/25/2013 0826   BUN 3* 05/11/2013 0425   CREATININE 1.1 05/25/2013 0826   CREATININE 1.10 05/11/2013 0425   CREATININE 1.28* 05/06/2013 1149      Component Value Date/Time   CALCIUM 7.9* 05/25/2013 0826   CALCIUM  6.7* 05/11/2013 0425   CALCIUM 6.4* 05/08/2013 1215   ALKPHOS 94 05/25/2013 0826   ALKPHOS 94 05/11/2013 0425   AST 22 05/25/2013 0826   AST 19 05/11/2013  0425   ALT 13 05/25/2013 0826   ALT 14 05/11/2013 0425   BILITOT 0.46 05/25/2013 0826   BILITOT 0.5 05/11/2013 0425       No results found for this basename: LABCA2    No components found with this basename: [id number]    No results found for this basename: INR,  in the last 168 hours  Urinalysis    Component Value Date/Time   COLORURINE YELLOW 05/02/2013 1911   APPEARANCEUR HAZY* 05/02/2013 1911   LABSPEC 1.015 05/02/2013 1911   PHURINE 7.5 05/02/2013 1911   GLUCOSEU NEGATIVE 05/02/2013 1911   HGBUR MODERATE* 05/02/2013 1911   BILIRUBINUR NEGATIVE 05/02/2013 1911   KETONESUR NEGATIVE 05/02/2013 1911   PROTEINUR NEGATIVE 05/02/2013 1911   UROBILINOGEN 0.2 05/02/2013 1911   NITRITE NEGATIVE 05/02/2013 1911   LEUKOCYTESUR SMALL* 05/02/2013 1911    STUDIES: Mr Breast Bilateral W Wo Contrast  06/03/2013   CLINICAL DATA:  Followup right breast invasive ductal carcinoma and metastatic right axillary lymph node biopsied on 01/26/2013, followed by neoadjuvant chemotherapy. She was unable to tolerate MRI at that time.  LABS:  None obtained today.  EXAM: BILATERAL BREAST MRI WITH AND WITHOUT CONTRAST  TECHNIQUE: Multiplanar, multisequence MR images of both breasts were obtained prior to and following the intravenous administration of 49m of MultiHance.  THREE-DIMENSIONAL MR IMAGE RENDERING ON INDEPENDENT WORKSTATION:  Three-dimensional MR images were rendered by post-processing of the original MR data on an independent workstation. The three-dimensional MR images were interpreted, and findings are reported in the following complete MRI report for this study. Three dimensional images were evaluated at the independent DynaCad workstation  COMPARISON:  Previous mammogram, ultrasound and biopsy examinations.  FINDINGS: Breast composition: b.  Scattered fibroglandular tissue.  Background parenchymal enhancement: Minimal  Right breast: Biopsy marker clip artifact in the anterior aspect of the upper outer right  breast. No mass or abnormal enhancement.  Left breast: No mass or abnormal enhancement.  Lymph nodes: No abnormal appearing lymph nodes.  Ancillary findings:  None.  IMPRESSION: No MR findings suspicious for malignancy at this time.  RECOMMENDATION: Treatment plan.  BI-RADS CATEGORY  6: Known biopsy-proven malignancy.   Electronically Signed   By: SEnrique SackM.D.   On: 06/03/2013 14:29   ASSESSMENT: 58y.o. [place] woman   (1) status post right breast and right axillary lymph node biopsy 01/26/2013,  for a clinical T1c N1, stage IIA invasive ductal carcinoma, grade 3, estrogen and progesterone receptor positive, HER-2 amplified by immunohistochemistry (3+), with an MIB-1 of 70%.  (2) treated neo-adjuvantly with 4 of 6 planned cycles of docetaxel, carboplatin, trastuzumab,and pertuzumab, last dose 04/27/2013   (a) final 2 cycles of chemotherapy omitted in the face of multiple renal and electrolyte abnormalities    (b) pertuzumab and trastuzumab to be continued until definitive surgery  (c) trastuzumab alone to be continued to complete one year of anti-HER-2 treatment  (d) echocardiograms every 3 months while on trastuzumab; most recent study 02/21/2013  (3) definitive surgery to follow chemotherapy  (4) adjuvant radiation to follow surgery  (5) antiestrogen therapy to follow radiation  PLAN: Ms. STatemwas able to complete 4 cycles of chemotherapy together with her anti-HER-2 immunotherapy. Her MRI just obtained shows a complete radiologic response; this correlates with 90% accuracy to a complete pathologic  response.  She is now ready to proceed to surgery. I suspect her axillary lymph node will have become negative and if so she will be a very good candidate for NSABP B-51, which randomizes patients in that situation to adjuvant radiation versus no adjuvant radiation.  Of course she needs to continue anti-HER-2 treatment. This will consist of both trastuzumab and pertuzumab until she has  her surgery, and after the surgery it will be trastuzumab alone, to complete a year. She will need a repeat echocardiogram sometime in June.  Once her local treatment is completed, she will start anastrozole.  The patient has a good understanding of the overall plan. She agrees with it. She knows a goal of treatment in her cases cure. She will call with any problems that may develop before her next visit  06/08/2013 6:48 PM Chauncey Cruel, MD

## 2013-06-08 NOTE — Telephone Encounter (Signed)
, °

## 2013-06-08 NOTE — Telephone Encounter (Signed)
Per staff message and POF I have scheduled appts.  JMW  

## 2013-06-13 ENCOUNTER — Other Ambulatory Visit: Payer: Self-pay | Admitting: Family Medicine

## 2013-06-15 ENCOUNTER — Other Ambulatory Visit: Payer: Self-pay | Admitting: Oncology

## 2013-06-15 ENCOUNTER — Telehealth: Payer: Self-pay | Admitting: Oncology

## 2013-06-15 ENCOUNTER — Other Ambulatory Visit (HOSPITAL_BASED_OUTPATIENT_CLINIC_OR_DEPARTMENT_OTHER): Payer: Medicare Other

## 2013-06-15 ENCOUNTER — Ambulatory Visit (HOSPITAL_BASED_OUTPATIENT_CLINIC_OR_DEPARTMENT_OTHER): Payer: Medicare Other

## 2013-06-15 VITALS — BP 128/60 | HR 80 | Temp 98.4°F | Wt 246.0 lb

## 2013-06-15 DIAGNOSIS — C50419 Malignant neoplasm of upper-outer quadrant of unspecified female breast: Secondary | ICD-10-CM

## 2013-06-15 DIAGNOSIS — C50411 Malignant neoplasm of upper-outer quadrant of right female breast: Secondary | ICD-10-CM

## 2013-06-15 DIAGNOSIS — Z5112 Encounter for antineoplastic immunotherapy: Secondary | ICD-10-CM

## 2013-06-15 LAB — CBC WITH DIFFERENTIAL/PLATELET
BASO%: 0.6 % (ref 0.0–2.0)
Basophils Absolute: 0 10*3/uL (ref 0.0–0.1)
EOS%: 5.5 % (ref 0.0–7.0)
Eosinophils Absolute: 0.4 10*3/uL (ref 0.0–0.5)
HEMATOCRIT: 29.6 % — AB (ref 34.8–46.6)
HGB: 9.8 g/dL — ABNORMAL LOW (ref 11.6–15.9)
LYMPH#: 1.3 10*3/uL (ref 0.9–3.3)
LYMPH%: 17.4 % (ref 14.0–49.7)
MCH: 30.2 pg (ref 25.1–34.0)
MCHC: 33.1 g/dL (ref 31.5–36.0)
MCV: 91.2 fL (ref 79.5–101.0)
MONO#: 0.5 10*3/uL (ref 0.1–0.9)
MONO%: 6.3 % (ref 0.0–14.0)
NEUT#: 5.3 10*3/uL (ref 1.5–6.5)
NEUT%: 70.2 % (ref 38.4–76.8)
PLATELETS: 264 10*3/uL (ref 145–400)
RBC: 3.25 10*6/uL — ABNORMAL LOW (ref 3.70–5.45)
RDW: 18.3 % — ABNORMAL HIGH (ref 11.2–14.5)
WBC: 7.6 10*3/uL (ref 3.9–10.3)

## 2013-06-15 LAB — COMPREHENSIVE METABOLIC PANEL (CC13)
ALT: 7 U/L (ref 0–55)
AST: 15 U/L (ref 5–34)
Albumin: 3.6 g/dL (ref 3.5–5.0)
Alkaline Phosphatase: 92 U/L (ref 40–150)
Anion Gap: 17 mEq/L — ABNORMAL HIGH (ref 3–11)
BUN: 10.5 mg/dL (ref 7.0–26.0)
CO2: 24 mEq/L (ref 22–29)
Calcium: 8.7 mg/dL (ref 8.4–10.4)
Chloride: 104 mEq/L (ref 98–109)
Creatinine: 1.4 mg/dL — ABNORMAL HIGH (ref 0.6–1.1)
Glucose: 146 mg/dl — ABNORMAL HIGH (ref 70–140)
Potassium: 3.7 mEq/L (ref 3.5–5.1)
Sodium: 145 mEq/L (ref 136–145)
Total Bilirubin: 0.39 mg/dL (ref 0.20–1.20)
Total Protein: 7.2 g/dL (ref 6.4–8.3)

## 2013-06-15 MED ORDER — HEPARIN SOD (PORK) LOCK FLUSH 100 UNIT/ML IV SOLN
500.0000 [IU] | Freq: Once | INTRAVENOUS | Status: AC | PRN
  Administered 2013-06-15: 500 [IU]
  Filled 2013-06-15: qty 5

## 2013-06-15 MED ORDER — SODIUM CHLORIDE 0.9 % IV SOLN
420.0000 mg | Freq: Once | INTRAVENOUS | Status: AC
  Administered 2013-06-15: 420 mg via INTRAVENOUS
  Filled 2013-06-15: qty 14

## 2013-06-15 MED ORDER — DIPHENHYDRAMINE HCL 25 MG PO CAPS
ORAL_CAPSULE | ORAL | Status: AC
  Filled 2013-06-15: qty 2

## 2013-06-15 MED ORDER — TRASTUZUMAB CHEMO INJECTION 440 MG
6.0000 mg/kg | Freq: Once | INTRAVENOUS | Status: AC
  Administered 2013-06-15: 672 mg via INTRAVENOUS
  Filled 2013-06-15: qty 32

## 2013-06-15 MED ORDER — SODIUM CHLORIDE 0.9 % IJ SOLN
10.0000 mL | INTRAMUSCULAR | Status: DC | PRN
  Administered 2013-06-15: 10 mL
  Filled 2013-06-15: qty 10

## 2013-06-15 MED ORDER — ACETAMINOPHEN 325 MG PO TABS
650.0000 mg | ORAL_TABLET | Freq: Once | ORAL | Status: AC
  Administered 2013-06-15: 650 mg via ORAL

## 2013-06-15 MED ORDER — SODIUM CHLORIDE 0.9 % IV SOLN
Freq: Once | INTRAVENOUS | Status: AC
  Administered 2013-06-15: 14:00:00 via INTRAVENOUS

## 2013-06-15 MED ORDER — ACETAMINOPHEN 325 MG PO TABS
ORAL_TABLET | ORAL | Status: AC
  Filled 2013-06-15: qty 2

## 2013-06-15 MED ORDER — DIPHENHYDRAMINE HCL 25 MG PO CAPS
50.0000 mg | ORAL_CAPSULE | Freq: Once | ORAL | Status: AC
  Administered 2013-06-15: 50 mg via ORAL

## 2013-06-15 NOTE — Telephone Encounter (Signed)
, °

## 2013-06-15 NOTE — Progress Notes (Signed)
1555--30 minute observation post perjeta completed--no complaints.  SLJ

## 2013-06-15 NOTE — Patient Instructions (Signed)
[facility] Discharge Instructions for Patients Receiving Chemotherapy  Today you received the following chemotherapy agents herceptin/perjeta  To help prevent nausea and vomiting after your treatment, we encourage you to take your nausea medication as directed   If you develop nausea and vomiting that is not controlled by your nausea medication, call the clinic.   BELOW ARE SYMPTOMS THAT SHOULD BE REPORTED IMMEDIATELY:  *FEVER GREATER THAN 100.5 F  *CHILLS WITH OR WITHOUT FEVER  NAUSEA AND VOMITING THAT IS NOT CONTROLLED WITH YOUR NAUSEA MEDICATION  *UNUSUAL SHORTNESS OF BREATH  *UNUSUAL BRUISING OR BLEEDING  TENDERNESS IN MOUTH AND THROAT WITH OR WITHOUT PRESENCE OF ULCERS  *URINARY PROBLEMS  *BOWEL PROBLEMS  UNUSUAL RASH Items with * indicate a potential emergency and should be followed up as soon as possible.  Feel free to call the clinic you have any questions or concerns. The clinic phone number is [phone].  

## 2013-06-16 ENCOUNTER — Other Ambulatory Visit: Payer: Self-pay | Admitting: *Deleted

## 2013-06-16 ENCOUNTER — Other Ambulatory Visit: Payer: Medicare Other

## 2013-06-16 ENCOUNTER — Ambulatory Visit: Payer: Medicare Other

## 2013-06-22 ENCOUNTER — Telehealth (INDEPENDENT_AMBULATORY_CARE_PROVIDER_SITE_OTHER): Payer: Self-pay

## 2013-06-22 NOTE — Telephone Encounter (Signed)
Called pt with appt 

## 2013-06-23 ENCOUNTER — Other Ambulatory Visit: Payer: Self-pay | Admitting: Family Medicine

## 2013-06-27 ENCOUNTER — Encounter (INDEPENDENT_AMBULATORY_CARE_PROVIDER_SITE_OTHER): Payer: Self-pay | Admitting: Surgery

## 2013-06-27 ENCOUNTER — Ambulatory Visit (INDEPENDENT_AMBULATORY_CARE_PROVIDER_SITE_OTHER): Payer: Medicare Other | Admitting: Surgery

## 2013-06-27 VITALS — BP 126/80 | HR 77 | Temp 98.4°F | Ht 64.0 in | Wt 246.0 lb

## 2013-06-27 DIAGNOSIS — C50419 Malignant neoplasm of upper-outer quadrant of unspecified female breast: Secondary | ICD-10-CM

## 2013-06-27 DIAGNOSIS — C50411 Malignant neoplasm of upper-outer quadrant of right female breast: Secondary | ICD-10-CM

## 2013-06-27 NOTE — Progress Notes (Signed)
Patient ID: [name], female   DOB: 07/11/1955, 58 y.o.   MRN: [id number]  Chief Complaint  Patient presents with  . Follow-up    HPI [name] is a 58 y.o. female.   HPI 58 y.o. [place] woman  (1) status post right breast and right axillary lymph node biopsy 01/26/2013, for a clinical T1c N1, stage IIA invasive ductal carcinoma, grade 3, estrogen and progesterone receptor positive, HER-2 amplified by immunohistochemistry (3+), with an MIB-1 of 70%.  (2) treated neo-adjuvantly with 4 of 6 planned cycles of docetaxel, carboplatin, trastuzumab,and pertuzumab, last dose 04/27/2013  (a) final 2 cycles of chemotherapy omitted in the face of multiple renal and electrolyte abnormalities  Returns in follow up after chemotherapy. She is feeling  Well. MRI shows complete response.       Past Medical History  Diagnosis Date  . Hyperlipidemia   . Obesity   . Asthma     prn neb. and inhaler  . Osteoarthritis 2003    left knee  . Non-insulin dependent type 2 diabetes mellitus   . Graves' disease   . Ophthalmic manifestation of Graves disease   . Hypertension     on multiple meds., has been on med. > 14 yr.  . Dehydration 04/13/2013  . Breast cancer 01/2013    right    Past Surgical History  Procedure Laterality Date  . Total knee arthroplasty Left 2003  . Total thyroidectomy    . Colonoscopy  2007  . Tubal ligation  1985  . Colonoscopy w/ polypectomy  2009  . Portacath placement Right 02/17/2013    Procedure: INSERTION PORT-A-CATH;  Surgeon: Joyice Faster. Cornett, MD;  Location: [facility];  Service: General;  Laterality: Right;    Family History  Problem Relation Age of Onset  . Heart disease Father   . Lung cancer Father   . Diabetes Mother     Social History History  Substance Use Topics  . Smoking status: Former Smoker -- 20 years    Quit date: 02/10/1991  . Smokeless tobacco: Never Used  . Alcohol Use: No    Allergies  Allergen Reactions    . Procardia [Nifedipine] Other (See Comments)    MIGRAINES    Current Outpatient Prescriptions  Medication Sig Dispense Refill  . ACCU-CHEK SMARTVIEW test strip USE ONE STRIP TO CHECK GLUCOSE 4 TIMES DAILY  100 each  5  . albuterol (PROVENTIL HFA;VENTOLIN HFA) 108 (90 BASE) MCG/ACT inhaler Inhale 2 puffs into the lungs every 4 (four) hours as needed.  18 g  2  . albuterol (PROVENTIL) (5 MG/ML) 0.5% nebulizer solution Take 2.5 mg by nebulization every 6 (six) hours as needed for wheezing or shortness of breath.      Marland Kitchen amLODipine (NORVASC) 10 MG tablet TAKE ONE TABLET BY MOUTH ONCE DAILY.  90 tablet  1  . Artificial Tear Ointment (ARTIFICIAL TEARS) ointment Place 1 drop into both eyes as needed (for grey eye disease).       Marland Kitchen aspirin 81 MG tablet Take 81 mg by mouth daily.      . calcium citrate (CALCITRATE - DOSED IN MG ELEMENTAL CALCIUM) 950 MG tablet Take 1 tablet (200 mg of elemental calcium total) by mouth 2 (two) times daily after a meal.      . cloNIDine (CATAPRES - DOSED IN MG/24 HR) 0.2 mg/24hr patch Place 0.2 mg onto the skin once a week. Mondays.      . cycloSPORINE (RESTASIS) 0.05 %  ophthalmic emulsion Place 1 drop into both eyes 2 (two) times daily.       Marland Kitchen dexamethasone (DECADRON) 4 MG tablet Take 1 tablet (4 mg total) by mouth as directed. Patient takes day before chemo and three days following chemo  8 tablet  0  . gabapentin (NEURONTIN) 100 MG capsule Take 1 capsule (100 mg total) by mouth 3 (three) times daily.  90 capsule  5  . hydrochlorothiazide (HYDRODIURIL) 25 MG tablet TAKE ONE TABLET BY MOUTH ONCE DAILY.  30 tablet  5  . ketoconazole (NIZORAL) 2 % cream Apply 1 application topically 2 (two) times daily.  60 g  0  . labetalol (NORMODYNE) 300 MG tablet TAKE (1) TABLET BY MOUTH TWICE DAILY.  60 tablet  5  . levothyroxine (SYNTHROID, LEVOTHROID) 125 MCG tablet Take 1 tablet (125 mcg total) by mouth daily before breakfast.  30 tablet  1  . LORazepam (ATIVAN) 0.5 MG tablet  Take 1 tablet (0.5 mg total) by mouth every 8 (eight) hours.  30 tablet  0  . metFORMIN (GLUCOPHAGE) 500 MG tablet TAKE (1) TABLET BY MOUTH TWICE A DAY WITH MEALS (BREAKFAST AND SUPPER).  60 tablet  2  . omeprazole (PRILOSEC) 20 MG capsule Take 1 capsule (20 mg total) by mouth daily.  30 capsule  1  . ondansetron (ZOFRAN ODT) 4 MG disintegrating tablet Take 1 tablet (4 mg total) by mouth every 8 (eight) hours as needed for nausea.  10 tablet  0  . potassium chloride SA (K-DUR,KLOR-CON) 20 MEQ tablet Take 1 tablet (20 mEq total) by mouth 2 (two) times daily.  10 tablet  0  . pravastatin (PRAVACHOL) 40 MG tablet Take 40 mg by mouth 2 (two) times daily at 8 am and 10 pm.      . prochlorperazine (COMPAZINE) 10 MG tablet Take 10 mg by mouth daily as needed for nausea or vomiting.       . vitamin B-12 (CYANOCOBALAMIN) 500 MCG tablet Take 500 mcg by mouth daily.      . Vitamin D, Ergocalciferol, (DRISDOL) 50000 UNITS CAPS capsule Take 1 capsule (50,000 Units total) by mouth every 7 (seven) days. First dose 05/17/13  7 capsule  0   No current facility-administered medications for this visit.    Review of Systems Review of Systems  Constitutional: Negative for fever, chills and unexpected weight change.  HENT: Negative for congestion, hearing loss, sore throat, trouble swallowing and voice change.   Eyes: Negative for visual disturbance.  Respiratory: Negative for cough and wheezing.   Cardiovascular: Negative for chest pain, palpitations and leg swelling.  Gastrointestinal: Negative for nausea, vomiting, abdominal pain, diarrhea, constipation, blood in stool, abdominal distention and anal bleeding.  Genitourinary: Negative for hematuria, vaginal bleeding and difficulty urinating.  Musculoskeletal: Negative for arthralgias.  Skin: Negative for rash and wound.  Neurological: Negative for seizures, syncope and headaches.  Hematological: Negative for adenopathy. Does not bruise/bleed easily.   Psychiatric/Behavioral: Negative for confusion.    Blood pressure 126/80, pulse 77, temperature 98.4 F (36.9 C), height 5' 4" (1.626 m), weight 246 lb (111.585 kg).  Physical Exam Physical Exam  Constitutional: She is oriented to person, place, and time. She appears well-developed and well-nourished.  HENT:  Head: Normocephalic.  Eyes: Pupils are equal, round, and reactive to light. No scleral icterus.  Neck: Normal range of motion. Neck supple.  Cardiovascular: Normal rate and regular rhythm.   Pulmonary/Chest: Right breast exhibits  No mass. Right breast exhibits no inverted  nipple, no skin change and no tenderness. Left breast exhibits no inverted nipple, no mass, no nipple discharge, no skin change and no tenderness.    Musculoskeletal: Normal range of motion.  Lymphadenopathy:    She has no cervical adenopathy.    She has no  axillary adenopathy.       Right axillary: no  Lateral adenopathy present.  Neurological: She is alert and oriented to person, place, and time.  Skin: Skin is warm and dry.  Psychiatric: She has a normal mood and affect. Her behavior is normal. Judgment and thought content normal.    Data Reviewed CLINICAL DATA: Followup right breast invasive ductal carcinoma and  metastatic right axillary lymph node biopsied on 01/26/2013,  followed by neoadjuvant chemotherapy. She was unable to tolerate MRI  at that time.  LABS: None obtained today.  EXAM:  BILATERAL BREAST MRI WITH AND WITHOUT CONTRAST  TECHNIQUE:  Multiplanar, multisequence MR images of both breasts were obtained  prior to and following the intravenous administration of 52m of  MultiHance.  THREE-DIMENSIONAL MR IMAGE RENDERING ON INDEPENDENT WORKSTATION:  Three-dimensional MR images were rendered by post-processing of the  original MR data on an independent workstation. The  three-dimensional MR images were interpreted, and findings are  reported in the following complete MRI report for  this study. Three  dimensional images were evaluated at the independent DynaCad  workstation  COMPARISON: Previous mammogram, ultrasound and biopsy examinations.  FINDINGS:  Breast composition: b. Scattered fibroglandular tissue.  Background parenchymal enhancement: Minimal  Right breast: Biopsy marker clip artifact in the anterior aspect of  the upper outer right breast. No mass or abnormal enhancement.  Left breast: No mass or abnormal enhancement.  Lymph nodes: No abnormal appearing lymph nodes.  Ancillary findings: None.  IMPRESSION:  No MR findings suspicious for malignancy at this time.  RECOMMENDATION:  Treatment plan.  BI-RADS CATEGORY 6: Known biopsy-proven malignancy.  Electronically Signed  By: SEnrique SackM.D.  On: 06/03/2013 14:29   Path   1.8 cm right breast outer quadrant  LN pos  Er/pr her 2 neu positive   Assessment    T1c N1 mX right breast cancer S/P neoadjuvant chemotherapy with complete clinical response Patient Active Problem List   Diagnosis Date Noted  . Malnutrition of moderate degree 05/11/2013  . Renal failure 05/08/2013  . CKD (chronic kidney disease) stage 2, GFR 60-89 ml/min 05/08/2013  . Acute renal failure 05/08/2013  . Diarrhea 05/08/2013  . Hyponatremia 05/08/2013  . UTI (urinary tract infection) 05/04/2013  . Hypocalcemia 05/04/2013  . Unspecified hypothyroidism 05/04/2013  . Dehydration 04/13/2013  . Breast cancer of upper-outer quadrant of right female breast 01/31/2013  . Atypical chest pain 02/12/2012  . Osteoarthritis   . Asthma 12/08/2010  . Degenerative joint disease 12/08/2010  . Obesity   . Anemia, iron deficiency   . Hyperlipidemia 10/16/2010  . Hypertension   . Diabetes mellitus, type 2   . Hyperthyroidism   . Tobacco abuse, in remission       Plan    Right lumpectomy  Right  SLND and port placement discussed. Pt may be eligible for trial study to be discussed by members of cancer center for neoadjuvant chemotherapy  and avoid ALND and only have SLN mapping.  The procedure has been discussed with the patient. Alternatives to surgery have been discussed with the patient.  Risks of surgery include bleeding,  Infection,  Seroma formation, death,  and the need for further surgery.   The  patient understands and wishes to proceed. ALND risks include bleeding infection  Arm swelling  Pain stiffness and more surgery.    Discussed the possibility of clinical trial placement and reviewed standard of care which will include a lymph node dissection. She is dependent upon her walker and has concerns about right arm lymphedema and her ability to mobilize herself with her walker. I discussed sentinel lymph node mapping with her as well. I explained outside of a clinical trial, this is not standard of care. I will forward her information to clinical research nurse at [facility] for possible enrollment in the alliance trial which I have explained to the patient today. From this she may be eligible for the B 51 trial. Patient would like to proceed with sentinel lymph node mapping and an attempt to reduce comorbidity even if outside a clinical trial.   CORNETT,THOMAS A. 06/27/2013, 12:17 PM

## 2013-06-27 NOTE — Patient Instructions (Signed)
Sentinel Lymph Node Biopsy Sentinel lymph node biopsy is a procedure in which a single lymph node is identified, removed, and examined for cancer. Lymph nodes are collections of tissue that help filter infections, cancer cells, and other waste substances from the bloodstream. Certain types of cancer can spread to nearby lymph nodes. The cancer spreads to one lymph node first, and then to others. The first lymph node that your cancer could spread to is called the sentinel lymph node. Examining the sentinel lymph node for cancer can help your caregiver plan future treatment for you. LET YOUR CAREGIVER KNOW ABOUT:   Allergies to food or medicine.  Medicines taken, including vitamins, herbs, eyedrops, over-the-counter medicines, and creams.  Use of steroids (by mouth or creams).  Previous problems with numbing medicines.  History of bleeding problems or blood clots.  Previous surgery.  Other health problems, including diabetes and kidney problems.  Possibility of pregnancy, if this applies. RISKS AND COMPLICATIONS   Infection.  Bleeding.  Allergic reaction to the dye used for the procedure.  Blue staining of the skin where the dye is injected.  Damaged lymph vessels, causing a buildup of fluid (lymphedema).  Pain or bruising at the biopsy site. BEFORE THE PROCEDURE   Stop smoking at least 2 weeks before the procedure. Not smoking will improve your health after the procedure and decrease the chance of getting a wound infection.  You may have blood tests to make sure your blood clots normally.  Ask your caregiver about changing or stopping your regular medicines.  Do not eat or drink anything for 8 hours before the procedure. PROCEDURE   You will be given medicine that makes you sleep (general anesthetic).  A blue, radioactive dye will be injected near the tumor. The dye will then spread into the sentinel lymph node.  A scanner will identify the sentinel lymph node.  A  small cut (incision) will be made, and the sentinel lymph node will be removed.  The sentinel lymph node will be examined in a lab. Sometimes, a sentinel lymph node biopsy is performed during another surgery, such as a mastectomy or lumpectomy for breast cancer.  AFTER THE PROCEDURE   You will go to a recovery room.  You will be monitored for several hours.  If complications do not occur, you will be allowed to go home a few hours after the procedure.  Your urine may be blue for the next 24 hours. This is normal. It is caused by the dye used during the procedure.  Your skin where the dye was injected may be blue for up to 8 weeks. Document Released: 03/24/2011 Document Reviewed: 03/24/2011 [facility] Patient Information 2014 [place]. Lumpectomy A lumpectomy is a form of "breast conserving" or "breast preservation" surgery. It may also be referred to as a partial mastectomy. During a lumpectomy, the portion of the breast that contains the cancerous tumor or breast mass (the lump) is removed. Some normal tissue around the lump may also be removed to make sure all the tumor has been removed. This surgery should take 40 minutes or less. LET [facility] CARE PROVIDER KNOW ABOUT:  Any allergies you have.  All medicines you are taking, including vitamins, herbs, eye drops, creams, and over-the-counter medicines.  Previous problems you or members of your family have had with the use of anesthetics.  Any blood disorders you have.  Previous surgeries you have had.  Medical conditions you have. RISKS AND COMPLICATIONS Generally, this is a safe procedure.  However, as with any procedure, complications can occur. Possible complications include:  Bleeding.  Infection.  Pain.  Temporary swelling.  Change in the shape of the breast, particularly if a large portion is removed. BEFORE THE PROCEDURE  Ask your health care provider about changing or stopping your regular  medicines.  Do not eat or drink anything for 7 8 hours before the surgery or as directed by your health care provider. Ask your health care provider if you can take a sip of water with any approved medicines.  On the day of surgery, your healthcare provider will use a mammogram or ultrasound to locate and mark the tumor in your breast. These markings on your breast will show where the cut (incision) will be made. PROCEDURE   An IV tube will be put into one of your veins.  You may be given medicine to help you relax before the surgery (sedative). You will be given one of the following:  A medicine that numbs the area (local anesthesia).  A medicine that makes you go to sleep (general anesthesia).  Your health care provider will use a kind of electric scalpel that uses heat to minimize bleeding (electrocautery knife).  A curved incision (like a smile or frown) that follows the natural curve of your breast is made, to allow for minimal scarring and better healing.  The tumor will be removed with some of the surrounding tissue. This will be sent to the lab for analysis. Your health care provider may also remove your lymph nodes at this time if needed.  Sometimes, but not always, a rubber tube called a drain will be surgically inserted into your breast area or armpit to collect excess fluid that may accumulate in the space where the tumor was. This drain is connected to a plastic bulb on the outside of your body. This drain creates suction to help remove the fluid.  The incisions will be closed with stitches (sutures).  A bandage may be placed over the incisions. AFTER THE PROCEDURE  You will be taken to the recovery area.  You will be given medicine for pain.  A small rubber drain may be placed in the breast for 2 3 days to prevent a collection of blood (hematoma) from developing in the breast. You will be given instructions on caring for the drain before you go home.  A pressure  bandage (dressing) will be applied for 1 2 days to prevent bleeding. Ask your health care provider how to care for your bandage at home. Document Released: 02/10/2006 Document Revised: 09/01/2012 Document Reviewed: 06/04/2012 [facility] Patient Information 2014 [place].

## 2013-07-04 ENCOUNTER — Other Ambulatory Visit (INDEPENDENT_AMBULATORY_CARE_PROVIDER_SITE_OTHER): Payer: Self-pay | Admitting: Surgery

## 2013-07-04 DIAGNOSIS — C50411 Malignant neoplasm of upper-outer quadrant of right female breast: Secondary | ICD-10-CM

## 2013-07-06 ENCOUNTER — Other Ambulatory Visit: Payer: Self-pay | Admitting: Physician Assistant

## 2013-07-06 ENCOUNTER — Ambulatory Visit (HOSPITAL_BASED_OUTPATIENT_CLINIC_OR_DEPARTMENT_OTHER): Payer: Medicare Other

## 2013-07-06 ENCOUNTER — Other Ambulatory Visit: Payer: Self-pay | Admitting: Hematology and Oncology

## 2013-07-06 VITALS — BP 130/75 | HR 86 | Temp 99.1°F | Resp 18 | Ht 64.0 in | Wt 244.0 lb

## 2013-07-06 DIAGNOSIS — C50419 Malignant neoplasm of upper-outer quadrant of unspecified female breast: Secondary | ICD-10-CM

## 2013-07-06 DIAGNOSIS — C50411 Malignant neoplasm of upper-outer quadrant of right female breast: Secondary | ICD-10-CM

## 2013-07-06 DIAGNOSIS — Z5112 Encounter for antineoplastic immunotherapy: Secondary | ICD-10-CM

## 2013-07-06 LAB — COMPREHENSIVE METABOLIC PANEL (CC13)
ALK PHOS: 93 U/L (ref 40–150)
ALT: 8 U/L (ref 0–55)
AST: 13 U/L (ref 5–34)
Albumin: 3.8 g/dL (ref 3.5–5.0)
Anion Gap: 12 mEq/L — ABNORMAL HIGH (ref 3–11)
BILIRUBIN TOTAL: 0.32 mg/dL (ref 0.20–1.20)
BUN: 14.5 mg/dL (ref 7.0–26.0)
CO2: 24 mEq/L (ref 22–29)
CREATININE: 1.4 mg/dL — AB (ref 0.6–1.1)
Calcium: 9.3 mg/dL (ref 8.4–10.4)
Chloride: 108 mEq/L (ref 98–109)
Glucose: 159 mg/dl — ABNORMAL HIGH (ref 70–140)
Potassium: 3.6 mEq/L (ref 3.5–5.1)
Sodium: 144 mEq/L (ref 136–145)
TOTAL PROTEIN: 7.5 g/dL (ref 6.4–8.3)

## 2013-07-06 LAB — CBC WITH DIFFERENTIAL/PLATELET
BASO%: 0.2 % (ref 0.0–2.0)
Basophils Absolute: 0 10*3/uL (ref 0.0–0.1)
EOS%: 3.4 % (ref 0.0–7.0)
Eosinophils Absolute: 0.2 10*3/uL (ref 0.0–0.5)
HEMATOCRIT: 31.5 % — AB (ref 34.8–46.6)
HGB: 10.2 g/dL — ABNORMAL LOW (ref 11.6–15.9)
LYMPH%: 22.3 % (ref 14.0–49.7)
MCH: 29.2 pg (ref 25.1–34.0)
MCHC: 32.4 g/dL (ref 31.5–36.0)
MCV: 90.3 fL (ref 79.5–101.0)
MONO#: 0.4 10*3/uL (ref 0.1–0.9)
MONO%: 7 % (ref 0.0–14.0)
NEUT#: 4 10*3/uL (ref 1.5–6.5)
NEUT%: 67.1 % (ref 38.4–76.8)
PLATELETS: 240 10*3/uL (ref 145–400)
RBC: 3.49 10*6/uL — ABNORMAL LOW (ref 3.70–5.45)
RDW: 15.3 % — ABNORMAL HIGH (ref 11.2–14.5)
WBC: 6 10*3/uL (ref 3.9–10.3)
lymph#: 1.3 10*3/uL (ref 0.9–3.3)

## 2013-07-06 MED ORDER — DIPHENHYDRAMINE HCL 25 MG PO CAPS
ORAL_CAPSULE | ORAL | Status: AC
  Filled 2013-07-06: qty 2

## 2013-07-06 MED ORDER — DIPHENHYDRAMINE HCL 25 MG PO CAPS
50.0000 mg | ORAL_CAPSULE | Freq: Once | ORAL | Status: AC
  Administered 2013-07-06: 50 mg via ORAL

## 2013-07-06 MED ORDER — SODIUM CHLORIDE 0.9 % IV SOLN
Freq: Once | INTRAVENOUS | Status: AC
  Administered 2013-07-06: 14:00:00 via INTRAVENOUS

## 2013-07-06 MED ORDER — SODIUM CHLORIDE 0.9 % IJ SOLN
10.0000 mL | INTRAMUSCULAR | Status: DC | PRN
  Administered 2013-07-06: 10 mL
  Filled 2013-07-06: qty 10

## 2013-07-06 MED ORDER — ACETAMINOPHEN 325 MG PO TABS
ORAL_TABLET | ORAL | Status: AC
Start: 2013-07-06 — End: 2013-07-06
  Filled 2013-07-06: qty 2

## 2013-07-06 MED ORDER — ACETAMINOPHEN 325 MG PO TABS
650.0000 mg | ORAL_TABLET | Freq: Once | ORAL | Status: AC
  Administered 2013-07-06: 650 mg via ORAL

## 2013-07-06 MED ORDER — TRASTUZUMAB CHEMO INJECTION 440 MG
6.0000 mg/kg | Freq: Once | INTRAVENOUS | Status: AC
  Administered 2013-07-06: 672 mg via INTRAVENOUS
  Filled 2013-07-06: qty 32

## 2013-07-06 MED ORDER — SODIUM CHLORIDE 0.9 % IV SOLN
420.0000 mg | Freq: Once | INTRAVENOUS | Status: AC
  Administered 2013-07-06: 420 mg via INTRAVENOUS
  Filled 2013-07-06: qty 14

## 2013-07-06 MED ORDER — HEPARIN SOD (PORK) LOCK FLUSH 100 UNIT/ML IV SOLN
500.0000 [IU] | Freq: Once | INTRAVENOUS | Status: AC | PRN
  Administered 2013-07-06: 500 [IU]
  Filled 2013-07-06: qty 5

## 2013-07-06 NOTE — Progress Notes (Signed)
Patient observed 82mins post perjeta, no issues. Discharged ambulatory, no acute distress with walker and family member.

## 2013-07-06 NOTE — Patient Instructions (Signed)
[facility] Discharge Instructions for Patients Receiving Chemotherapy  Today you received the following chemotherapy agents: Hercepetin, Perjeta. To help prevent nausea and vomiting after your treatment, we encourage you to take your nausea medication.   If you develop nausea and vomiting that is not controlled by your nausea medication, call the clinic.   BELOW ARE SYMPTOMS THAT SHOULD BE REPORTED IMMEDIATELY:  *FEVER GREATER THAN 100.5 F  *CHILLS WITH OR WITHOUT FEVER  NAUSEA AND VOMITING THAT IS NOT CONTROLLED WITH YOUR NAUSEA MEDICATION  *UNUSUAL SHORTNESS OF BREATH  *UNUSUAL BRUISING OR BLEEDING  TENDERNESS IN MOUTH AND THROAT WITH OR WITHOUT PRESENCE OF ULCERS  *URINARY PROBLEMS  *BOWEL PROBLEMS  UNUSUAL RASH Items with * indicate a potential emergency and should be followed up as soon as possible.  Feel free to call the clinic you have any questions or concerns. The clinic phone number is [phone].

## 2013-07-11 ENCOUNTER — Ambulatory Visit (HOSPITAL_BASED_OUTPATIENT_CLINIC_OR_DEPARTMENT_OTHER)
Admission: RE | Admit: 2013-07-11 | Discharge: 2013-07-11 | Disposition: A | Discharge status: Home / Self Care | Payer: Medicare Other | Source: Ambulatory Visit | Attending: Internal Medicine | Admitting: Internal Medicine

## 2013-07-11 ENCOUNTER — Ambulatory Visit (HOSPITAL_COMMUNITY)
Admission: RE | Admit: 2013-07-11 | Discharge: 2013-07-11 | Disposition: A | Discharge status: Home / Self Care | Payer: Medicare Other | Source: Ambulatory Visit | Attending: Family Medicine | Admitting: Family Medicine

## 2013-07-11 ENCOUNTER — Encounter (HOSPITAL_COMMUNITY): Payer: Self-pay

## 2013-07-11 VITALS — BP 164/86 | HR 73 | Wt 247.4 lb

## 2013-07-11 DIAGNOSIS — C50411 Malignant neoplasm of upper-outer quadrant of right female breast: Secondary | ICD-10-CM

## 2013-07-11 DIAGNOSIS — C50419 Malignant neoplasm of upper-outer quadrant of unspecified female breast: Secondary | ICD-10-CM

## 2013-07-11 DIAGNOSIS — J45909 Unspecified asthma, uncomplicated: Secondary | ICD-10-CM

## 2013-07-11 DIAGNOSIS — I517 Cardiomegaly: Secondary | ICD-10-CM

## 2013-07-11 DIAGNOSIS — E119 Type 2 diabetes mellitus without complications: Secondary | ICD-10-CM

## 2013-07-11 DIAGNOSIS — C50919 Malignant neoplasm of unspecified site of unspecified female breast: Secondary | ICD-10-CM | POA: Insufficient documentation

## 2013-07-11 DIAGNOSIS — I10 Essential (primary) hypertension: Secondary | ICD-10-CM

## 2013-07-11 NOTE — Patient Instructions (Signed)
Follow up in 3 months with an ECHO 

## 2013-07-11 NOTE — Progress Notes (Signed)
  Echocardiogram 2D Echocardiogram has been performed.  Mauricio Po 07/11/2013, 1:58 PM

## 2013-07-11 NOTE — Progress Notes (Signed)
Patient ID: [name], female   DOB: 12-05-1955, 58 y.o.   MRN: [id number]   PCP: [facility]o Cardiologist: previously Dr. Lattie Haw Oncologist: Dr. Humphrey Rolls Patient ID: [name], female   DOB: June 29, 1955, 58 y.o.   MRN: [id number]    HPI:  [name] is a 58 y.o. female. 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 The biopsied right lymph node was positive for metastatic disease. She is referred to the Cardio-Oncology clinic for monitoring during Herceptin therapy.   Medical hx notable for morbid obesity, DM2, Graves disease, severe HTN, tobacco abuse (q 1990). Had cardiac cath in 1995 which showed normal coronaries.   Completed 6 cycles  Taxotere carboplatinum Herceptin/perjeta (started 02/23/13)  She continues Herceptin/Perjeta every 3 weeks. And will continue for 1 year.   She return for follow up. Denies SOB/PND/Orthopnea. Feeling better after Taxotere stopped. Ambulates with rolling walker. Ongoing L Knee pain.   ECHO 02/21/13: LVEF 60-65% Grade 1 DD.  lateral s' 10.3 cm/s. GLS -18.4 ECHO 6//29/15 EF 60-65% lat s' 10.9 GLS 20.3    Past Medical History  Diagnosis Date  . Hyperlipidemia   . Obesity   . Asthma     prn neb. and inhaler  . Osteoarthritis 2003    left knee  . Non-insulin dependent type 2 diabetes mellitus   . Graves' disease   . Ophthalmic manifestation of Graves disease   . Hypertension     on multiple meds., has been on med. > 14 yr.  . Dehydration 04/13/2013  . Breast cancer 01/2013    right    Current Outpatient Prescriptions  Medication Sig Dispense Refill  . ACCU-CHEK SMARTVIEW test strip USE ONE STRIP TO CHECK GLUCOSE 4 TIMES DAILY  100 each  5  . albuterol (PROVENTIL HFA;VENTOLIN HFA) 108 (90 BASE) MCG/ACT inhaler Inhale 2 puffs into the lungs every 4 (four) hours as needed.  18 g  2  . albuterol (PROVENTIL) (5 MG/ML) 0.5% nebulizer solution Take 2.5 mg by  nebulization every 6 (six) hours as needed for wheezing or shortness of breath.      Marland Kitchen amLODipine (NORVASC) 10 MG tablet TAKE ONE TABLET BY MOUTH ONCE DAILY.  90 tablet  1  . Artificial Tear Ointment (ARTIFICIAL TEARS) ointment Place 1 drop into both eyes as needed (for grey eye disease).       Marland Kitchen aspirin 81 MG tablet Take 81 mg by mouth daily.      . calcium citrate (CALCITRATE - DOSED IN MG ELEMENTAL CALCIUM) 950 MG tablet Take 1 tablet (200 mg of elemental calcium total) by mouth 2 (two) times daily after a meal.      . cloNIDine (CATAPRES - DOSED IN MG/24 HR) 0.2 mg/24hr patch Place 0.2 mg onto the skin once a week. Mondays.      . cycloSPORINE (RESTASIS) 0.05 % ophthalmic emulsion Place 1 drop into both eyes 2 (two) times daily.       Marland Kitchen dexamethasone (DECADRON) 4 MG tablet Take 1 tablet (4 mg total) by mouth as directed. Patient takes day before chemo and three days following chemo  8 tablet  0  . gabapentin (NEURONTIN) 100 MG capsule Take 1 capsule (100 mg total) by mouth 3 (three) times daily.  90 capsule  5  . hydrochlorothiazide (HYDRODIURIL) 25 MG tablet TAKE ONE TABLET BY MOUTH ONCE DAILY.  30 tablet  5  . ketoconazole (NIZORAL) 2 % cream  Apply 1 application topically 2 (two) times daily.  60 g  0  . labetalol (NORMODYNE) 300 MG tablet TAKE (1) TABLET BY MOUTH TWICE DAILY.  60 tablet  5  . levothyroxine (SYNTHROID, LEVOTHROID) 125 MCG tablet Take 1 tablet (125 mcg total) by mouth daily before breakfast.  30 tablet  1  . LORazepam (ATIVAN) 0.5 MG tablet Take 1 tablet (0.5 mg total) by mouth every 8 (eight) hours.  30 tablet  0  . metFORMIN (GLUCOPHAGE) 500 MG tablet TAKE (1) TABLET BY MOUTH TWICE A DAY WITH MEALS (BREAKFAST AND SUPPER).  60 tablet  2  . omeprazole (PRILOSEC) 20 MG capsule Take 1 capsule (20 mg total) by mouth daily.  30 capsule  1  . ondansetron (ZOFRAN ODT) 4 MG disintegrating tablet Take 1 tablet (4 mg total) by mouth every 8 (eight) hours as needed for nausea.  10 tablet   0  . potassium chloride SA (K-DUR,KLOR-CON) 20 MEQ tablet Take 1 tablet (20 mEq total) by mouth 2 (two) times daily.  10 tablet  0  . pravastatin (PRAVACHOL) 40 MG tablet Take 40 mg by mouth 2 (two) times daily at 8 am and 10 pm.      . prochlorperazine (COMPAZINE) 10 MG tablet Take 10 mg by mouth daily as needed for nausea or vomiting.       . vitamin B-12 (CYANOCOBALAMIN) 500 MCG tablet Take 500 mcg by mouth daily.      . Vitamin D, Ergocalciferol, (DRISDOL) 50000 UNITS CAPS capsule Take 1 capsule (50,000 Units total) by mouth every 7 (seven) days. First dose 05/17/13  7 capsule  0   No current facility-administered medications for this encounter.    Allergies  Allergen Reactions  . Procardia [Nifedipine] Other (See Comments)    MIGRAINES    History   Social History  . Marital Status: Divorced    Spouse Name: N/A    Number of Children: 2  . Years of Education: N/A   Occupational History  . Disability    Social History Main Topics  . Smoking status: Former Smoker -- 20 years    Quit date: 02/10/1991  . Smokeless tobacco: Never Used  . Alcohol Use: No  . Drug Use: No  . Sexual Activity: No   Other Topics Concern  . Not on file   Social History Narrative   Lives in [place]          Family History  Problem Relation Age of Onset  . Heart disease Father   . Lung cancer Father   . Diabetes Mother     PHYSICAL EXAM: Filed Vitals:   07/11/13 1410  BP: 164/86  Pulse: 73  Weight: 247 lb 6.4 oz (112.22 kg)  SpO2: 100%    General:  Well appearing. No respiratory difficulty Daughter present  HEENT: normal x for exophthalmosis  Neck: supple. no JVD. Carotids 2+ bilat; no bruits. No lymphadenopathy or thryomegaly appreciated. Cor: PMI nondisplaced. Regular rate & rhythm. No rubs, gallops or murmurs. Keloid noted central aspect of her chest.  Lungs: clear Abdomen: obese soft, nontender, nondistended. No hepatosplenomegaly. No bruits or masses. Good bowel  sounds. Extremities: no cyanosis, clubbing, rash, edema Neuro: alert & oriented x 3, cranial nerves grossly intact. moves all 4 extremities w/o difficulty. Affect pleasant.   ASSESSMENT & PLAN: 1. R breast cancer 2. HTN -followed by Dr. Wolfgang Phoenix. Would consider increasing clonidine.   Dr Haroldine Laws reviewed and discussed ECHO. Doppler parameters stable.  Continue Herceptin. Follow-up  with echo in 3 months.  CLEGG,AMY NP-C  2:12 PM   Patient seen and examined with Darrick Grinder, NP. We discussed all aspects of the encounter. I agree with the assessment and plan as stated above. I reviewed echos personally. EF and Doppler parameters stable. No HF on exam. Continue Herceptin. Would consider increasing clonidine to get BP under better control.   Benay Spice 2:36 PM

## 2013-07-12 ENCOUNTER — Encounter (HOSPITAL_BASED_OUTPATIENT_CLINIC_OR_DEPARTMENT_OTHER): Payer: Self-pay | Admitting: *Deleted

## 2013-07-12 NOTE — Progress Notes (Signed)
07/12/13 1649  OBSTRUCTIVE SLEEP APNEA  Have you ever been diagnosed with sleep apnea through a sleep study? No  Do you snore loudly (loud enough to be heard through closed doors)?  0  Do you often feel tired, fatigued, or sleepy during the daytime? 0  Has anyone observed you stop breathing during your sleep? 0  Do you have, or are you being treated for high blood pressure? 1  BMI more than 35 kg/m2? 1  Age over 59 years old? 1  Neck circumference greater than 40 cm/16 inches? 1  Gender: 0  Obstructive Sleep Apnea Score 4  Score 4 or greater  Results sent to PCP

## 2013-07-12 NOTE — Progress Notes (Signed)
Had cbc cmet-07/06/13-dr cornette ok with those labs-ekg-4/15-followed by cardiology-echo 6/15-good-

## 2013-07-14 ENCOUNTER — Ambulatory Visit
Admission: RE | Admit: 2013-07-14 | Discharge: 2013-07-14 | Disposition: A | Discharge status: Home / Self Care | Payer: Medicare Other | Source: Ambulatory Visit | Attending: Surgery | Admitting: Surgery

## 2013-07-14 DIAGNOSIS — C50411 Malignant neoplasm of upper-outer quadrant of right female breast: Secondary | ICD-10-CM

## 2013-07-18 ENCOUNTER — Other Ambulatory Visit (HOSPITAL_COMMUNITY): Payer: Medicare Other

## 2013-07-19 ENCOUNTER — Ambulatory Visit (HOSPITAL_BASED_OUTPATIENT_CLINIC_OR_DEPARTMENT_OTHER)
Admission: RE | Admit: 2013-07-19 | Discharge: 2013-07-19 | Disposition: A | Discharge status: Home / Self Care | Payer: Medicare Other | Source: Ambulatory Visit | Attending: Surgery | Admitting: Surgery

## 2013-07-19 ENCOUNTER — Ambulatory Visit (HOSPITAL_BASED_OUTPATIENT_CLINIC_OR_DEPARTMENT_OTHER): Payer: Medicare Other | Admitting: Anesthesiology

## 2013-07-19 ENCOUNTER — Encounter (HOSPITAL_BASED_OUTPATIENT_CLINIC_OR_DEPARTMENT_OTHER)
Admission: RE | Disposition: A | Discharge status: Home / Self Care | Payer: Self-pay | Source: Ambulatory Visit | Attending: Surgery

## 2013-07-19 ENCOUNTER — Ambulatory Visit
Admission: RE | Admit: 2013-07-19 | Discharge: 2013-07-19 | Disposition: A | Discharge status: Home / Self Care | Payer: Medicare Other | Source: Ambulatory Visit | Attending: Surgery | Admitting: Surgery

## 2013-07-19 ENCOUNTER — Encounter (HOSPITAL_BASED_OUTPATIENT_CLINIC_OR_DEPARTMENT_OTHER): Payer: Medicare Other | Admitting: Anesthesiology

## 2013-07-19 ENCOUNTER — Encounter (HOSPITAL_COMMUNITY)
Admission: RE | Admit: 2013-07-19 | Discharge: 2013-07-19 | Disposition: A | Discharge status: Home / Self Care | Payer: Medicare Other | Source: Ambulatory Visit | Attending: Surgery | Admitting: Surgery

## 2013-07-19 ENCOUNTER — Encounter (HOSPITAL_BASED_OUTPATIENT_CLINIC_OR_DEPARTMENT_OTHER): Payer: Self-pay

## 2013-07-19 DIAGNOSIS — Z170 Estrogen receptor positive status [ER+]: Secondary | ICD-10-CM | POA: Insufficient documentation

## 2013-07-19 DIAGNOSIS — E785 Hyperlipidemia, unspecified: Secondary | ICD-10-CM | POA: Diagnosis not present

## 2013-07-19 DIAGNOSIS — Z87891 Personal history of nicotine dependence: Secondary | ICD-10-CM | POA: Diagnosis not present

## 2013-07-19 DIAGNOSIS — C50419 Malignant neoplasm of upper-outer quadrant of unspecified female breast: Secondary | ICD-10-CM | POA: Insufficient documentation

## 2013-07-19 DIAGNOSIS — Z9221 Personal history of antineoplastic chemotherapy: Secondary | ICD-10-CM | POA: Diagnosis not present

## 2013-07-19 DIAGNOSIS — Z7982 Long term (current) use of aspirin: Secondary | ICD-10-CM | POA: Diagnosis not present

## 2013-07-19 DIAGNOSIS — Z96659 Presence of unspecified artificial knee joint: Secondary | ICD-10-CM | POA: Diagnosis not present

## 2013-07-19 DIAGNOSIS — C50919 Malignant neoplasm of unspecified site of unspecified female breast: Secondary | ICD-10-CM | POA: Insufficient documentation

## 2013-07-19 DIAGNOSIS — I129 Hypertensive chronic kidney disease with stage 1 through stage 4 chronic kidney disease, or unspecified chronic kidney disease: Secondary | ICD-10-CM | POA: Insufficient documentation

## 2013-07-19 DIAGNOSIS — E119 Type 2 diabetes mellitus without complications: Secondary | ICD-10-CM | POA: Insufficient documentation

## 2013-07-19 DIAGNOSIS — N182 Chronic kidney disease, stage 2 (mild): Secondary | ICD-10-CM | POA: Diagnosis not present

## 2013-07-19 DIAGNOSIS — E669 Obesity, unspecified: Secondary | ICD-10-CM | POA: Diagnosis not present

## 2013-07-19 DIAGNOSIS — C50411 Malignant neoplasm of upper-outer quadrant of right female breast: Secondary | ICD-10-CM

## 2013-07-19 DIAGNOSIS — J45909 Unspecified asthma, uncomplicated: Secondary | ICD-10-CM | POA: Diagnosis not present

## 2013-07-19 DIAGNOSIS — D4860 Neoplasm of uncertain behavior of unspecified breast: Secondary | ICD-10-CM

## 2013-07-19 HISTORY — DX: Presence of spectacles and contact lenses: Z97.3

## 2013-07-19 HISTORY — DX: Gastro-esophageal reflux disease without esophagitis: K21.9

## 2013-07-19 LAB — GLUCOSE, CAPILLARY
Glucose-Capillary: 129 mg/dL — ABNORMAL HIGH (ref 70–99)
Glucose-Capillary: 139 mg/dL — ABNORMAL HIGH (ref 70–99)

## 2013-07-19 SURGERY — RADIOACTIVE SEED GUIDED PARTIAL MASTECTOMY WITH AXILLARY SENTINEL LYMPH NODE BIOPSY
Anesthesia: General | Site: Breast | Laterality: Right

## 2013-07-19 MED ORDER — FENTANYL CITRATE 0.05 MG/ML IJ SOLN
INTRAMUSCULAR | Status: AC
  Filled 2013-07-19: qty 2

## 2013-07-19 MED ORDER — FENTANYL CITRATE 0.05 MG/ML IJ SOLN
INTRAMUSCULAR | Status: DC | PRN
  Administered 2013-07-19: 25 ug via INTRAVENOUS
  Administered 2013-07-19 (×2): 50 ug via INTRAVENOUS
  Administered 2013-07-19 (×3): 25 ug via INTRAVENOUS

## 2013-07-19 MED ORDER — CHLORHEXIDINE GLUCONATE 4 % EX LIQD: 1.0000 "application " | Freq: Once | CUTANEOUS | Status: DC

## 2013-07-19 MED ORDER — METHYLENE BLUE 1 % INJ SOLN
INTRAMUSCULAR | Status: DC | PRN
  Administered 2013-07-19: 08:00:00 via INTRAMUSCULAR

## 2013-07-19 MED ORDER — PHENYLEPHRINE HCL 10 MG/ML IJ SOLN
INTRAMUSCULAR | Status: DC | PRN
  Administered 2013-07-19: 40 ug via INTRAVENOUS

## 2013-07-19 MED ORDER — LACTATED RINGERS IV SOLN
INTRAVENOUS | Status: DC
  Administered 2013-07-19 (×2): via INTRAVENOUS

## 2013-07-19 MED ORDER — OXYCODONE HCL 5 MG PO TABS
5.0000 mg | ORAL_TABLET | Freq: Once | ORAL | Status: AC
  Administered 2013-07-19: 5 mg via ORAL

## 2013-07-19 MED ORDER — HYDROMORPHONE HCL PF 1 MG/ML IJ SOLN
0.2500 mg | INTRAMUSCULAR | Status: DC | PRN
  Administered 2013-07-19: 0.25 mg via INTRAVENOUS
  Administered 2013-07-19: 0.5 mg via INTRAVENOUS

## 2013-07-19 MED ORDER — EPHEDRINE SULFATE 50 MG/ML IJ SOLN
INTRAMUSCULAR | Status: DC | PRN
  Administered 2013-07-19 (×2): 10 mg via INTRAVENOUS

## 2013-07-19 MED ORDER — FENTANYL CITRATE 0.05 MG/ML IJ SOLN
INTRAMUSCULAR | Status: AC
  Filled 2013-07-19: qty 6

## 2013-07-19 MED ORDER — BUPIVACAINE-EPINEPHRINE (PF) 0.25% -1:200000 IJ SOLN
INTRAMUSCULAR | Status: DC | PRN
  Administered 2013-07-19: 26 mL

## 2013-07-19 MED ORDER — DEXAMETHASONE SODIUM PHOSPHATE 4 MG/ML IJ SOLN
INTRAMUSCULAR | Status: DC | PRN
  Administered 2013-07-19: 10 mg via INTRAVENOUS

## 2013-07-19 MED ORDER — MIDAZOLAM HCL 2 MG/2ML IJ SOLN
1.0000 mg | INTRAMUSCULAR | Status: DC | PRN
  Administered 2013-07-19: 1 mg via INTRAVENOUS

## 2013-07-19 MED ORDER — FENTANYL CITRATE 0.05 MG/ML IJ SOLN
INTRAMUSCULAR | Status: AC
  Filled 2013-07-19: qty 4

## 2013-07-19 MED ORDER — MIDAZOLAM HCL 2 MG/2ML IJ SOLN
INTRAMUSCULAR | Status: AC
  Filled 2013-07-19: qty 2

## 2013-07-19 MED ORDER — PROPOFOL 10 MG/ML IV BOLUS
INTRAVENOUS | Status: DC | PRN
  Administered 2013-07-19: 200 mg via INTRAVENOUS

## 2013-07-19 MED ORDER — GLYCOPYRROLATE 0.2 MG/ML IJ SOLN
INTRAMUSCULAR | Status: DC | PRN
  Administered 2013-07-19 (×2): 0.2 mg via INTRAVENOUS

## 2013-07-19 MED ORDER — OXYCODONE-ACETAMINOPHEN 5-325 MG PO TABS: 1.0000 | ORAL_TABLET | ORAL | Status: DC | PRN

## 2013-07-19 MED ORDER — SODIUM CHLORIDE 0.9 % IJ SOLN
INTRAMUSCULAR | Status: AC
  Filled 2013-07-19: qty 10

## 2013-07-19 MED ORDER — METHYLENE BLUE 1 % INJ SOLN
INTRAMUSCULAR | Status: AC
  Filled 2013-07-19: qty 10

## 2013-07-19 MED ORDER — LIDOCAINE HCL (CARDIAC) 20 MG/ML IV SOLN
INTRAVENOUS | Status: DC | PRN
  Administered 2013-07-19: 50 mg via INTRAVENOUS

## 2013-07-19 MED ORDER — ONDANSETRON HCL 4 MG/2ML IJ SOLN
INTRAMUSCULAR | Status: DC | PRN
  Administered 2013-07-19: 4 mg via INTRAVENOUS

## 2013-07-19 MED ORDER — BUPIVACAINE-EPINEPHRINE (PF) 0.25% -1:200000 IJ SOLN
INTRAMUSCULAR | Status: AC
  Filled 2013-07-19: qty 30

## 2013-07-19 MED ORDER — HYDROMORPHONE HCL PF 1 MG/ML IJ SOLN
INTRAMUSCULAR | Status: AC
  Filled 2013-07-19: qty 1

## 2013-07-19 MED ORDER — FENTANYL CITRATE 0.05 MG/ML IJ SOLN
50.0000 ug | INTRAMUSCULAR | Status: DC | PRN
Start: 2013-07-19 — End: 2013-07-19
  Administered 2013-07-19: 50 ug via INTRAVENOUS

## 2013-07-19 MED ORDER — OXYCODONE HCL 5 MG PO TABS
ORAL_TABLET | ORAL | Status: AC
  Filled 2013-07-19: qty 1

## 2013-07-19 MED ORDER — CEFAZOLIN SODIUM-DEXTROSE 2-3 GM-% IV SOLR: 2.0000 g | INTRAVENOUS | Status: DC

## 2013-07-19 MED ORDER — MIDAZOLAM HCL 5 MG/5ML IJ SOLN
INTRAMUSCULAR | Status: DC | PRN
  Administered 2013-07-19: 2 mg via INTRAVENOUS

## 2013-07-19 MED ORDER — PROPOFOL 10 MG/ML IV BOLUS
INTRAVENOUS | Status: AC
  Filled 2013-07-19: qty 80

## 2013-07-19 MED ORDER — CEFAZOLIN SODIUM-DEXTROSE 2-3 GM-% IV SOLR
INTRAVENOUS | Status: DC | PRN
  Administered 2013-07-19: 2 g via INTRAVENOUS

## 2013-07-19 MED ORDER — CEFAZOLIN SODIUM-DEXTROSE 2-3 GM-% IV SOLR
INTRAVENOUS | Status: AC
  Filled 2013-07-19: qty 50

## 2013-07-19 SURGICAL SUPPLY — 60 items
ADH SKN CLS APL DERMABOND .7 (GAUZE/BANDAGES/DRESSINGS) ×1
APPLIER CLIP 9.375 MED OPEN (MISCELLANEOUS)
APR CLP MED 9.3 20 MLT OPN (MISCELLANEOUS)
BINDER BREAST LRG (GAUZE/BANDAGES/DRESSINGS) IMPLANT
BINDER BREAST MEDIUM (GAUZE/BANDAGES/DRESSINGS) IMPLANT
BINDER BREAST XLRG (GAUZE/BANDAGES/DRESSINGS) IMPLANT
BINDER BREAST XXLRG (GAUZE/BANDAGES/DRESSINGS) ×1 IMPLANT
BLADE SURG 15 STRL LF DISP TIS (BLADE) ×1 IMPLANT
BLADE SURG 15 STRL SS (BLADE) ×2
CANISTER SUC SOCK COL 7IN (MISCELLANEOUS) ×1 IMPLANT
CANISTER SUCT 1200ML W/VALVE (MISCELLANEOUS) ×2 IMPLANT
CHLORAPREP W/TINT 26ML (MISCELLANEOUS) ×2 IMPLANT
CLIP APPLIE 9.375 MED OPEN (MISCELLANEOUS) IMPLANT
CLIP TI WIDE RED SMALL 6 (CLIP) ×3 IMPLANT
COVER MAYO STAND STRL (DRAPES) ×2 IMPLANT
COVER PROBE W GEL 5X96 (DRAPES) ×2 IMPLANT
COVER TABLE BACK 60X90 (DRAPES) ×2 IMPLANT
DECANTER SPIKE VIAL GLASS SM (MISCELLANEOUS) IMPLANT
DERMABOND ADVANCED (GAUZE/BANDAGES/DRESSINGS) ×1
DERMABOND ADVANCED .7 DNX12 (GAUZE/BANDAGES/DRESSINGS) ×1 IMPLANT
DEVICE DUBIN W/COMP PLATE 8390 (MISCELLANEOUS) ×2 IMPLANT
DRAPE LAPAROSCOPIC ABDOMINAL (DRAPES) IMPLANT
DRAPE PED LAPAROTOMY (DRAPES) ×2 IMPLANT
DRAPE UTILITY XL STRL (DRAPES) ×2 IMPLANT
ELECT COATED BLADE 2.86 ST (ELECTRODE) ×2 IMPLANT
ELECT REM PT RETURN 9FT ADLT (ELECTROSURGICAL) ×2
ELECTRODE REM PT RTRN 9FT ADLT (ELECTROSURGICAL) ×1 IMPLANT
GLOVE BIO SURGEON STRL SZ 6.5 (GLOVE) ×2 IMPLANT
GLOVE BIOGEL PI IND STRL 6.5 (GLOVE) IMPLANT
GLOVE BIOGEL PI IND STRL 7.0 (GLOVE) IMPLANT
GLOVE BIOGEL PI IND STRL 8 (GLOVE) ×1 IMPLANT
GLOVE BIOGEL PI INDICATOR 6.5 (GLOVE) ×1
GLOVE BIOGEL PI INDICATOR 7.0 (GLOVE) ×1
GLOVE BIOGEL PI INDICATOR 8 (GLOVE) ×1
GLOVE ECLIPSE 6.5 STRL STRAW (GLOVE) ×1 IMPLANT
GLOVE ECLIPSE 8.0 STRL XLNG CF (GLOVE) ×2 IMPLANT
GOWN STRL REUS W/ TWL LRG LVL3 (GOWN DISPOSABLE) ×2 IMPLANT
GOWN STRL REUS W/TWL LRG LVL3 (GOWN DISPOSABLE) ×6
KIT MARKER MARGIN INK (KITS) ×2 IMPLANT
NDL HYPO 25X1 1.5 SAFETY (NEEDLE) ×1 IMPLANT
NDL SAFETY ECLIPSE 18X1.5 (NEEDLE) IMPLANT
NEEDLE HYPO 18GX1.5 SHARP (NEEDLE) ×2
NEEDLE HYPO 25X1 1.5 SAFETY (NEEDLE) ×4 IMPLANT
NS IRRIG 1000ML POUR BTL (IV SOLUTION) ×2 IMPLANT
PACK BASIN DAY SURGERY FS (CUSTOM PROCEDURE TRAY) ×2 IMPLANT
PENCIL BUTTON HOLSTER BLD 10FT (ELECTRODE) ×2 IMPLANT
PIN SAFETY STERILE (MISCELLANEOUS) IMPLANT
SLEEVE SCD COMPRESS KNEE MED (MISCELLANEOUS) ×2 IMPLANT
SPONGE LAP 4X18 X RAY DECT (DISPOSABLE) ×2 IMPLANT
STAPLER VISISTAT 35W (STAPLE) IMPLANT
SUT MNCRL AB 4-0 PS2 18 (SUTURE) ×2 IMPLANT
SUT SILK 2 0 SH (SUTURE) IMPLANT
SUT VIC AB 3-0 SH 27 (SUTURE) ×4
SUT VIC AB 3-0 SH 27X BRD (SUTURE) ×1 IMPLANT
SYRINGE CONTROL L 12CC (SYRINGE) ×4 IMPLANT
SYRINGE CONTROL LL 12CC (SYRINGE) ×1 IMPLANT
TOWEL OR 17X24 6PK STRL BLUE (TOWEL DISPOSABLE) ×2 IMPLANT
TOWEL OR NON WOVEN STRL DISP B (DISPOSABLE) ×2 IMPLANT
TUBE CONNECTING 20X1/4 (TUBING) ×1 IMPLANT
YANKAUER SUCT BULB TIP NO VENT (SUCTIONS) ×1 IMPLANT

## 2013-07-19 NOTE — Interval H&P Note (Signed)
History and Physical Interval Note:  07/19/2013 7:25 AM  [name]  has presented today for surgery, with the diagnosis of right breast cancer  The various methods of treatment have been discussed with the patient and family. After consideration of risks, benefits and other options for treatment, the patient has consented to  Procedure(s): RIGHT BREAST RADIOACTIVE SEED GUIDED LUMPECTOMY WITH AXILLARY SENTINEL LYMPH NODE BIOPSY (Right) as a surgical intervention .  The patient's history has been reviewed, patient examined, no change in status, stable for surgery.  I have reviewed the patient's chart and labs.  Questions were answered to the patient's satisfaction.   Pt not eligible for clinical trial BUT DOES NOT WANT ALND even with positive node prechemotherapy..  Aware this is not standard of care but has significant concerns about downstream side effects of ALND.  Wishes to proceed with SLN mapping and understands she may need ALND down the road. aWARE OF RISK OF LYMPHEDEMA OF 5 % WITH SLN  MAPPING.   [name]

## 2013-07-19 NOTE — Brief Op Note (Signed)
07/19/2013  9:48 AM  PATIENT:  [name]  58 y.o. female  PRE-OPERATIVE DIAGNOSIS:  right breast cancer  POST-OPERATIVE DIAGNOSIS:  right breast cancer  PROCEDURE:  Procedure(s): RIGHT BREAST RADIOACTIVE SEED GUIDED LUMPECTOMY WITH AXILLARY SENTINEL LYMPH NODE BIOPSY (Right)  SURGEON:  Surgeon(s) and Role:    * Gretna Bergin A. Jaquarius Seder, MD - Primary     ASSISTANTS: none   ANESTHESIA:   local and general  EBL:  Total I/O In: 1600 [I.V.:1600] Out: -   BLOOD ADMINISTERED:none  DRAINS: none   LOCAL MEDICATIONS USED:  BUPIVICAINE   SPECIMEN:  Source of Specimen:  right breast and axilla  DISPOSITION OF SPECIMEN:  PATHOLOGY  COUNTS:  YES  TOURNIQUET:  * No tourniquets in log *  DICTATION: .Other Dictation: Dictation Number  [id number]  PLAN OF CARE: Discharge to home after PACU  PATIENT DISPOSITION:  PACU - hemodynamically stable.   Delay start of Pharmacological VTE agent (>24hrs) due to surgical blood loss or risk of bleeding: not applicable

## 2013-07-19 NOTE — H&P (View-Only) (Signed)
Patient ID: [name], female   DOB: 04/13/1955, 57 y.o.   MRN: [id number]  Chief Complaint  Patient presents with  . Follow-up    HPI [name] is a 57 y.o. female.   HPI 57 y.o. [place] woman  (1) status post right breast and right axillary lymph node biopsy 01/26/2013, for a clinical T1c N1, stage IIA invasive ductal carcinoma, grade 3, estrogen and progesterone receptor positive, HER-2 amplified by immunohistochemistry (3+), with an MIB-1 of 70%.  (2) treated neo-adjuvantly with 4 of 6 planned cycles of docetaxel, carboplatin, trastuzumab,and pertuzumab, last dose 04/27/2013  (a) final 2 cycles of chemotherapy omitted in the face of multiple renal and electrolyte abnormalities  Returns in follow up after chemotherapy. She is feeling  Well. MRI shows complete response.       Past Medical History  Diagnosis Date  . Hyperlipidemia   . Obesity   . Asthma     prn neb. and inhaler  . Osteoarthritis 2003    left knee  . Non-insulin dependent type 2 diabetes mellitus   . Graves' disease   . Ophthalmic manifestation of Graves disease   . Hypertension     on multiple meds., has been on med. > 14 yr.  . Dehydration 04/13/2013  . Breast cancer 01/2013    right    Past Surgical History  Procedure Laterality Date  . Total knee arthroplasty Left 2003  . Total thyroidectomy    . Colonoscopy  2007  . Tubal ligation  1985  . Colonoscopy w/ polypectomy  2009  . Portacath placement Right 02/17/2013    Procedure: INSERTION PORT-A-CATH;  Surgeon: Majed Pellegrin A. Keyarah Mcroy, MD;  Location: [facility];  Service: General;  Laterality: Right;    Family History  Problem Relation Age of Onset  . Heart disease Father   . Lung cancer Father   . Diabetes Mother     Social History History  Substance Use Topics  . Smoking status: Former Smoker -- 20 years    Quit date: 02/10/1991  . Smokeless tobacco: Never Used  . Alcohol Use: No    Allergies  Allergen Reactions    . Procardia [Nifedipine] Other (See Comments)    MIGRAINES    Current Outpatient Prescriptions  Medication Sig Dispense Refill  . ACCU-CHEK SMARTVIEW test strip USE ONE STRIP TO CHECK GLUCOSE 4 TIMES DAILY  100 each  5  . albuterol (PROVENTIL HFA;VENTOLIN HFA) 108 (90 BASE) MCG/ACT inhaler Inhale 2 puffs into the lungs every 4 (four) hours as needed.  18 g  2  . albuterol (PROVENTIL) (5 MG/ML) 0.5% nebulizer solution Take 2.5 mg by nebulization every 6 (six) hours as needed for wheezing or shortness of breath.      . amLODipine (NORVASC) 10 MG tablet TAKE ONE TABLET BY MOUTH ONCE DAILY.  90 tablet  1  . Artificial Tear Ointment (ARTIFICIAL TEARS) ointment Place 1 drop into both eyes as needed (for grey eye disease).       . aspirin 81 MG tablet Take 81 mg by mouth daily.      . calcium citrate (CALCITRATE - DOSED IN MG ELEMENTAL CALCIUM) 950 MG tablet Take 1 tablet (200 mg of elemental calcium total) by mouth 2 (two) times daily after a meal.      . cloNIDine (CATAPRES - DOSED IN MG/24 HR) 0.2 mg/24hr patch Place 0.2 mg onto the skin once a week. Mondays.      . cycloSPORINE (RESTASIS) 0.05 %   ophthalmic emulsion Place 1 drop into both eyes 2 (two) times daily.       . dexamethasone (DECADRON) 4 MG tablet Take 1 tablet (4 mg total) by mouth as directed. Patient takes day before chemo and three days following chemo  8 tablet  0  . gabapentin (NEURONTIN) 100 MG capsule Take 1 capsule (100 mg total) by mouth 3 (three) times daily.  90 capsule  5  . hydrochlorothiazide (HYDRODIURIL) 25 MG tablet TAKE ONE TABLET BY MOUTH ONCE DAILY.  30 tablet  5  . ketoconazole (NIZORAL) 2 % cream Apply 1 application topically 2 (two) times daily.  60 g  0  . labetalol (NORMODYNE) 300 MG tablet TAKE (1) TABLET BY MOUTH TWICE DAILY.  60 tablet  5  . levothyroxine (SYNTHROID, LEVOTHROID) 125 MCG tablet Take 1 tablet (125 mcg total) by mouth daily before breakfast.  30 tablet  1  . LORazepam (ATIVAN) 0.5 MG tablet  Take 1 tablet (0.5 mg total) by mouth every 8 (eight) hours.  30 tablet  0  . metFORMIN (GLUCOPHAGE) 500 MG tablet TAKE (1) TABLET BY MOUTH TWICE A DAY WITH MEALS (BREAKFAST AND SUPPER).  60 tablet  2  . omeprazole (PRILOSEC) 20 MG capsule Take 1 capsule (20 mg total) by mouth daily.  30 capsule  1  . ondansetron (ZOFRAN ODT) 4 MG disintegrating tablet Take 1 tablet (4 mg total) by mouth every 8 (eight) hours as needed for nausea.  10 tablet  0  . potassium chloride SA (K-DUR,KLOR-CON) 20 MEQ tablet Take 1 tablet (20 mEq total) by mouth 2 (two) times daily.  10 tablet  0  . pravastatin (PRAVACHOL) 40 MG tablet Take 40 mg by mouth 2 (two) times daily at 8 am and 10 pm.      . prochlorperazine (COMPAZINE) 10 MG tablet Take 10 mg by mouth daily as needed for nausea or vomiting.       . vitamin B-12 (CYANOCOBALAMIN) 500 MCG tablet Take 500 mcg by mouth daily.      . Vitamin D, Ergocalciferol, (DRISDOL) 50000 UNITS CAPS capsule Take 1 capsule (50,000 Units total) by mouth every 7 (seven) days. First dose 05/17/13  7 capsule  0   No current facility-administered medications for this visit.    Review of Systems Review of Systems  Constitutional: Negative for fever, chills and unexpected weight change.  HENT: Negative for congestion, hearing loss, sore throat, trouble swallowing and voice change.   Eyes: Negative for visual disturbance.  Respiratory: Negative for cough and wheezing.   Cardiovascular: Negative for chest pain, palpitations and leg swelling.  Gastrointestinal: Negative for nausea, vomiting, abdominal pain, diarrhea, constipation, blood in stool, abdominal distention and anal bleeding.  Genitourinary: Negative for hematuria, vaginal bleeding and difficulty urinating.  Musculoskeletal: Negative for arthralgias.  Skin: Negative for rash and wound.  Neurological: Negative for seizures, syncope and headaches.  Hematological: Negative for adenopathy. Does not bruise/bleed easily.   Psychiatric/Behavioral: Negative for confusion.    Blood pressure 126/80, pulse 77, temperature 98.4 F (36.9 C), height 5' 4" (1.626 m), weight 246 lb (111.585 kg).  Physical Exam Physical Exam  Constitutional: She is oriented to person, place, and time. She appears well-developed and well-nourished.  HENT:  Head: Normocephalic.  Eyes: Pupils are equal, round, and reactive to light. No scleral icterus.  Neck: Normal range of motion. Neck supple.  Cardiovascular: Normal rate and regular rhythm.   Pulmonary/Chest: Right breast exhibits  No mass. Right breast exhibits no inverted   nipple, no skin change and no tenderness. Left breast exhibits no inverted nipple, no mass, no nipple discharge, no skin change and no tenderness.    Musculoskeletal: Normal range of motion.  Lymphadenopathy:    She has no cervical adenopathy.    She has no  axillary adenopathy.       Right axillary: no  Lateral adenopathy present.  Neurological: She is alert and oriented to person, place, and time.  Skin: Skin is warm and dry.  Psychiatric: She has a normal mood and affect. Her behavior is normal. Judgment and thought content normal.    Data Reviewed CLINICAL DATA: Followup right breast invasive ductal carcinoma and  metastatic right axillary lymph node biopsied on 01/26/2013,  followed by neoadjuvant chemotherapy. She was unable to tolerate MRI  at that time.  LABS: None obtained today.  EXAM:  BILATERAL BREAST MRI WITH AND WITHOUT CONTRAST  TECHNIQUE:  Multiplanar, multisequence MR images of both breasts were obtained  prior to and following the intravenous administration of 52m of  MultiHance.  THREE-DIMENSIONAL MR IMAGE RENDERING ON INDEPENDENT WORKSTATION:  Three-dimensional MR images were rendered by post-processing of the  original MR data on an independent workstation. The  three-dimensional MR images were interpreted, and findings are  reported in the following complete MRI report for  this study. Three  dimensional images were evaluated at the independent DynaCad  workstation  COMPARISON: Previous mammogram, ultrasound and biopsy examinations.  FINDINGS:  Breast composition: b. Scattered fibroglandular tissue.  Background parenchymal enhancement: Minimal  Right breast: Biopsy marker clip artifact in the anterior aspect of  the upper outer right breast. No mass or abnormal enhancement.  Left breast: No mass or abnormal enhancement.  Lymph nodes: No abnormal appearing lymph nodes.  Ancillary findings: None.  IMPRESSION:  No MR findings suspicious for malignancy at this time.  RECOMMENDATION:  Treatment plan.  BI-RADS CATEGORY 6: Known biopsy-proven malignancy.  Electronically Signed  By: SEnrique SackM.D.  On: 06/03/2013 14:29   Path   1.8 cm right breast outer quadrant  LN pos  Er/pr her 2 neu positive   Assessment    T1c N1 mX right breast cancer S/P neoadjuvant chemotherapy with complete clinical response Patient Active Problem List   Diagnosis Date Noted  . Malnutrition of moderate degree 05/11/2013  . Renal failure 05/08/2013  . CKD (chronic kidney disease) stage 2, GFR 60-89 ml/min 05/08/2013  . Acute renal failure 05/08/2013  . Diarrhea 05/08/2013  . Hyponatremia 05/08/2013  . UTI (urinary tract infection) 05/04/2013  . Hypocalcemia 05/04/2013  . Unspecified hypothyroidism 05/04/2013  . Dehydration 04/13/2013  . Breast cancer of upper-outer quadrant of right female breast 01/31/2013  . Atypical chest pain 02/12/2012  . Osteoarthritis   . Asthma 12/08/2010  . Degenerative joint disease 12/08/2010  . Obesity   . Anemia, iron deficiency   . Hyperlipidemia 10/16/2010  . Hypertension   . Diabetes mellitus, type 2   . Hyperthyroidism   . Tobacco abuse, in remission       Plan    Right lumpectomy  Right  SLND and port placement discussed. Pt may be eligible for trial study to be discussed by members of cancer center for neoadjuvant chemotherapy  and avoid ALND and only have SLN mapping.  The procedure has been discussed with the patient. Alternatives to surgery have been discussed with the patient.  Risks of surgery include bleeding,  Infection,  Seroma formation, death,  and the need for further surgery.   The  patient understands and wishes to proceed. ALND risks include bleeding infection  Arm swelling  Pain stiffness and more surgery.    Discussed the possibility of clinical trial placement and reviewed standard of care which will include a lymph node dissection. She is dependent upon her walker and has concerns about right arm lymphedema and her ability to mobilize herself with her walker. I discussed sentinel lymph node mapping with her as well. I explained outside of a clinical trial, this is not standard of care. I will forward her information to clinical research nurse at cancer Center for possible enrollment in the alliance trial which I have explained to the patient today. From this she may be eligible for the B 51 trial. Patient would like to proceed with sentinel lymph node mapping and an attempt to reduce comorbidity even if outside a clinical trial.   Gevork Ayyad A. 06/27/2013, 12:17 PM    

## 2013-07-19 NOTE — Discharge Instructions (Signed)
[facility] °Office Phone Number [phone] ° °BREAST BIOPSY/ PARTIAL MASTECTOMY: POST OP INSTRUCTIONS ° °Always review your discharge instruction sheet given to you by the facility where your surgery was performed. ° °IF YOU HAVE DISABILITY OR FAMILY LEAVE FORMS, YOU MUST BRING THEM TO THE OFFICE FOR PROCESSING.  DO NOT GIVE THEM TO YOUR DOCTOR. ° °1. A prescription for pain medication may be given to you upon discharge.  Take your pain medication as prescribed, if needed.  If narcotic pain medicine is not needed, then you may take acetaminophen (Tylenol) or ibuprofen (Advil) as needed. °2. Take your usually prescribed medications unless otherwise directed °3. If you need a refill on your pain medication, please contact your pharmacy.  They will contact our office to request authorization.  Prescriptions will not be filled after 5pm or on week-ends. °4. You should eat very light the first 24 hours after surgery, such as soup, crackers, pudding, etc.  Resume your normal diet the day after surgery. °5. Most patients will experience some swelling and bruising in the breast.  Ice packs and a good support bra will help.  Swelling and bruising can take several days to resolve.  °6. It is common to experience some constipation if taking pain medication after surgery.  Increasing fluid intake and taking a stool softener will usually help or prevent this problem from occurring.  A mild laxative (Milk of Magnesia or Miralax) should be taken according to package directions if there are no bowel movements after 48 hours. °7. Unless discharge instructions indicate otherwise, you may remove your bandages 24-48 hours after surgery, and you may shower at that time.  You may have steri-strips (small skin tapes) in place directly over the incision.  These strips should be left on the skin for 7-10 days.  If your surgeon used skin glue on the incision, you may shower in 24 hours.  The glue will flake off over the  next 2-3 weeks.  Any sutures or staples will be removed at the office during your follow-up visit. °8. ACTIVITIES:  You may resume regular daily activities (gradually increasing) beginning the next day.  Wearing a good support bra or sports bra minimizes pain and swelling.  You may have sexual intercourse when it is comfortable. °a. You may drive when you no longer are taking prescription pain medication, you can comfortably wear a seatbelt, and you can safely maneuver your car and apply brakes. °b. RETURN TO WORK:  ______________________________________________________________________________________ °9. You should see your doctor in the office for a follow-up appointment approximately two weeks after your surgery.  Your doctor’s nurse will typically make your follow-up appointment when she calls you with your pathology report.  Expect your pathology report 2-3 business days after your surgery.  You may call to check if you do not hear from us after three days. °10. OTHER INSTRUCTIONS: _______________________________________________________________________________________________ _____________________________________________________________________________________________________________________________________ °_____________________________________________________________________________________________________________________________________ °_____________________________________________________________________________________________________________________________________ ° °WHEN TO CALL YOUR DOCTOR: °1. Fever over 101.0 °2. Nausea and/or vomiting. °3. Extreme swelling or bruising. °4. Continued bleeding from incision. °5. Increased pain, redness, or drainage from the incision. ° °The clinic staff is available to answer your questions during regular business hours.  Please don’t hesitate to call and ask to speak to one of the nurses for clinical concerns.  If you have a medical emergency, go to the nearest  emergency room or call 911.  A surgeon from [facility] is always on call at the hospital. ° °For further questions, please visit [email or web address]  ° ° °  Post Anesthesia Home Care Instructions ° °Activity: °Get plenty of rest for the remainder of the day. A responsible adult should stay with you for 24 hours following the procedure.  °For the next 24 hours, DO NOT: °-Drive a car °-Operate machinery °-Drink alcoholic beverages °-Take any medication unless instructed by your physician °-Make any legal decisions or sign important papers. ° °Meals: °Start with liquid foods such as gelatin or soup. Progress to regular foods as tolerated. Avoid greasy, spicy, heavy foods. If nausea and/or vomiting occur, drink only clear liquids until the nausea and/or vomiting subsides. Call your physician if vomiting continues. ° °Special Instructions/Symptoms: °Your throat may feel dry or sore from the anesthesia or the breathing tube placed in your throat during surgery. If this causes discomfort, gargle with warm salt water. The discomfort should disappear within 24 hours. ° °

## 2013-07-19 NOTE — Transfer of Care (Signed)
Immediate Anesthesia Transfer of Care Note  Patient: [name]  Procedure(s) Performed: Procedure(s): RIGHT BREAST RADIOACTIVE SEED GUIDED LUMPECTOMY WITH AXILLARY SENTINEL LYMPH NODE BIOPSY (Right)  Patient Location: PACU  Anesthesia Type:General  Level of Consciousness: awake, oriented and patient cooperative  Airway & Oxygen Therapy: Patient Spontanous Breathing and Patient connected to face mask oxygen  Post-op Assessment: Report given to PACU RN and Post -op Vital signs reviewed and stable  Post vital signs: Reviewed and stable  Complications: No apparent anesthesia complications

## 2013-07-19 NOTE — Anesthesia Preprocedure Evaluation (Addendum)
Anesthesia Evaluation  Patient identified by MRN, date of birth, ID band Patient awake    Reviewed: Allergy & Precautions, H&P , NPO status , Patient's Chart, lab work & pertinent test results  Airway Mallampati: II      Dental   Pulmonary asthma , former smoker,  breath sounds clear to auscultation        Cardiovascular hypertension, Rhythm:Regular Rate:Normal     Neuro/Psych    GI/Hepatic Neg liver ROS, GERD-  ,  Endo/Other  diabetesHypothyroidism Hyperthyroidism   Renal/GU Renal disease     Musculoskeletal   Abdominal   Peds  Hematology   Anesthesia Other Findings   Reproductive/Obstetrics                          Anesthesia Physical Anesthesia Plan  ASA: III  Anesthesia Plan: General   Post-op Pain Management:    Induction: Intravenous  Airway Management Planned: Oral ETT  Additional Equipment:   Intra-op Plan:   Post-operative Plan: Extubation in OR  Informed Consent: I have reviewed the patients History and Physical, chart, labs and discussed the procedure including the risks, benefits and alternatives for the proposed anesthesia with the patient or authorized representative who has indicated his/her understanding and acceptance.   Dental advisory given  Plan Discussed with: CRNA and Anesthesiologist  Anesthesia Plan Comments:         Anesthesia Quick Evaluation

## 2013-07-19 NOTE — Anesthesia Procedure Notes (Signed)
Procedure Name: LMA Insertion Date/Time: 07/19/2013 8:02 AM Performed by: Toula Moos L Pre-anesthesia Checklist: Patient identified, Emergency Drugs available, Suction available, Patient being monitored and Timeout performed Patient Re-evaluated:Patient Re-evaluated prior to inductionOxygen Delivery Method: Circle System Utilized Preoxygenation: Pre-oxygenation with 100% oxygen Intubation Type: IV induction Ventilation: Mask ventilation without difficulty LMA: LMA inserted LMA Size: 4.0 Number of attempts: 1 Airway Equipment and Method: bite block Placement Confirmation: positive ETCO2 and breath sounds checked- equal and bilateral Tube secured with: Tape Dental Injury: Teeth and Oropharynx as per pre-operative assessment

## 2013-07-19 NOTE — Anesthesia Postprocedure Evaluation (Signed)
  Anesthesia Post-op Note  Patient: [name]  Procedure(s) Performed: Procedure(s): RIGHT BREAST RADIOACTIVE SEED GUIDED LUMPECTOMY WITH AXILLARY SENTINEL LYMPH NODE BIOPSY (Right)  Patient Location: PACU  Anesthesia Type:General  Level of Consciousness: awake  Airway and Oxygen Therapy: Patient Spontanous Breathing  Post-op Pain: mild  Post-op Assessment: Post-op Vital signs reviewed  Post-op Vital Signs: Reviewed  Last Vitals:  Filed Vitals:   07/19/13 0730  BP: 125/64  Pulse: 67  Temp:   Resp: 19    Complications: No apparent anesthesia complications

## 2013-07-20 NOTE — Op Note (Unsigned)
[name], [name]              ACCOUNT NO.:  [id number]  MEDICAL RECORD NO.:  [id number]  LOCATION:                                 FACILITY:  PHYSICIAN:  Glynn Freas A. Braylyn Kalter, M.D.DATE OF BIRTH:  1955/09/29  DATE OF PROCEDURE:  07/19/2013 DATE OF DISCHARGE:                              OPERATIVE REPORT   PREOPERATIVE DIAGNOSIS:  Stage II right breast cancer.  POSTOPERATIVE DIAGNOSIS:  Stage II right breast cancer.  PROCEDURE: 1. Right breast seed localized lumpectomy. 2. Right axillary sentinel lymph node mapping with the use of     methylene blue dye.  SURGEON:  Marcello Moores A. Yailyn Strack, M.D.  ANESTHESIA:  LMA with 0.25% Sensorcaine local with epinephrine.  ESTIMATED BLOOD LOSS:  Minimal.  SPECIMEN: 1. Right breast tissue which contained the clip and seed to pathology. 2. Five right axillary sentinel nodes, which were evaluated by touch     prep and negative for metastatic disease.  DRAINS:  None.  INDICATIONS FOR PROCEDURE:  The patient is a 58 year old female, who has stage II right breast cancer.  She opted for neoadjuvant chemotherapy, and by MRI had a complete clinical response.  She wished to preserve her breast.  She did have a positive lymph node prior to the initiation of chemotherapy.  We discussed clinical trials with her, which would involves sentinel lymph node mapping to try to avoid lymph node dissection even though that is considered standard care in this setting. She did not qualify for a trial, but still wished to undergo sentinel lymph node mapping understanding that it was out of clinical trial realm and not the standard care, which would have been to do a completion lymph node dissection.  Given the fact that she walks with a cane, is right-hand dominant.  She wished to avoid any comorbidity involving her right upper extremity for those reasons.  Given her clinical response, which appeared to be a complete clinical response, I felt it was reasonable  with the understanding that lymph node dissection maybe necessary down the road depending on her final pathology.  Risk of bleeding, infection, lymphedema, stiffness of the right upper extremity, the need for other operations, injury to major neurovascular structures in the right axilla, blood clot, and exacerbation of underlying cardiovascular comorbidities was discussed with the patient preoperatively.  She agreed to proceed.  DESCRIPTION OF PROCEDURE:  The patient underwent seed placement last week.  This was unremarkable.  Today, she presents for her breast conservation surgery.  She was met in the holding area.  Right breast was marked and Neoprobe was used to identify that the seed was indeed in the right breast, which was and had a good signal.  Prior to this, she underwent injection by Nuclear Medicine with technetium sulfur colloid in the periareolar position as a standard practice.  After discussion of the surgery with the patient family, she had no further questions and wished to proceed.  The site was marked, which was her right.  She was taken back to the operating room and placed supine on the OR table. After induction of LMA anesthesia, the right breast and right upper extremity were prepped and draped in sterile fashion.  Time-out was done and the right side was verified as correct.  The procedure was right lumpectomy and right sentinel lymph node mapping.  I then used methylene blue and used 4 mL injected in a subareolar position and the dermis. This was massaged for 2 minutes.  We then used a Neoprobe to marked right breast where the seed was located.  A curvilinear incision was made in the right upper outer quadrant.  The rest dissection was carried down and the seed was actually quite superficial.  I went ahead and dissected the breast tissue off the undersurface of the skin and then we went completely around the specimen and excised it with a wide gross margins,  which was clear.  Neoprobe was used and the seed was in the specimen.  Radiograph was then performed and reviewed with the radiologist, which showed both clip and seed in the specimen.  This was then passed out to realm to pathology.  We then did the sentinel lymph node mapping through the same incision in the right upper outer quadrant of breast.  Neoprobe was used.  Dissection was carried down with this setting set for technetium.  We identified 5 hot nodes.  Of this 5; 3 were blue and hot.  The other 2 were just hot white counts.  Once these were removed background counts approached 0 in the right axilla.  These were sent for touch prep.  Given her history of previous metastatic disease and neoadjuvant therapy, and these were all negative by touch prep.  Irrigation was then used and suctioned out.  No evidence of bleeding.  The deep layer of the breast was then closed with 3-0 Vicryl. After placement of clips to mark the lumpectomy cavity.  A 4-0 Monocryl was then used to close the skin in subcuticular fashion.  Dermabond applied.  All final counts of sponge, needle, and instruments found to be correct at this portion of the case.  The patient was awoke extubated taken to recovery in satisfactory condition.     Drayk Humbarger A. Lakena Sparlin, M.D.     TAC/MEDQ  D:  07/19/2013  T:  07/20/2013  Job:  [id number]

## 2013-07-22 ENCOUNTER — Telehealth (INDEPENDENT_AMBULATORY_CARE_PROVIDER_SITE_OTHER): Payer: Self-pay

## 2013-07-22 NOTE — Telephone Encounter (Signed)
Informed pt of message below. Pt verbalized understanding  

## 2013-07-22 NOTE — Telephone Encounter (Signed)
LMOM> path below. Complete response from neoadjuvant therapy. Clean margins and neg nodes.

## 2013-07-22 NOTE — Telephone Encounter (Signed)
Message copied by Carlene Coria on Fri Jul 22, 2013  4:25 PM ------      Message from: Erroll Luna A      Created: Wed Jul 20, 2013  6:31 PM       No cancer left complete response ------

## 2013-07-26 ENCOUNTER — Encounter (HOSPITAL_BASED_OUTPATIENT_CLINIC_OR_DEPARTMENT_OTHER): Payer: Self-pay | Admitting: Surgery

## 2013-07-26 ENCOUNTER — Other Ambulatory Visit: Payer: Self-pay | Admitting: *Deleted

## 2013-07-27 ENCOUNTER — Other Ambulatory Visit: Payer: Self-pay | Admitting: *Deleted

## 2013-07-27 DIAGNOSIS — C50411 Malignant neoplasm of upper-outer quadrant of right female breast: Secondary | ICD-10-CM

## 2013-07-28 ENCOUNTER — Ambulatory Visit (HOSPITAL_BASED_OUTPATIENT_CLINIC_OR_DEPARTMENT_OTHER): Payer: Medicare Other

## 2013-07-28 ENCOUNTER — Ambulatory Visit (HOSPITAL_BASED_OUTPATIENT_CLINIC_OR_DEPARTMENT_OTHER): Payer: Medicare Other | Admitting: Oncology

## 2013-07-28 ENCOUNTER — Telehealth: Payer: Self-pay | Admitting: *Deleted

## 2013-07-28 ENCOUNTER — Other Ambulatory Visit (HOSPITAL_BASED_OUTPATIENT_CLINIC_OR_DEPARTMENT_OTHER): Payer: Medicare Other

## 2013-07-28 ENCOUNTER — Encounter: Payer: Self-pay | Admitting: *Deleted

## 2013-07-28 ENCOUNTER — Telehealth: Payer: Self-pay | Admitting: Oncology

## 2013-07-28 VITALS — BP 148/66 | HR 81 | Temp 98.7°F | Resp 18 | Ht 64.0 in | Wt 247.8 lb

## 2013-07-28 DIAGNOSIS — Z170 Estrogen receptor positive status [ER+]: Secondary | ICD-10-CM

## 2013-07-28 DIAGNOSIS — C50411 Malignant neoplasm of upper-outer quadrant of right female breast: Secondary | ICD-10-CM

## 2013-07-28 DIAGNOSIS — C773 Secondary and unspecified malignant neoplasm of axilla and upper limb lymph nodes: Secondary | ICD-10-CM

## 2013-07-28 DIAGNOSIS — M25569 Pain in unspecified knee: Secondary | ICD-10-CM

## 2013-07-28 DIAGNOSIS — C50419 Malignant neoplasm of upper-outer quadrant of unspecified female breast: Secondary | ICD-10-CM

## 2013-07-28 DIAGNOSIS — Z5112 Encounter for antineoplastic immunotherapy: Secondary | ICD-10-CM

## 2013-07-28 LAB — COMPREHENSIVE METABOLIC PANEL (CC13)
ALK PHOS: 97 U/L (ref 40–150)
ALT: 10 U/L (ref 0–55)
AST: 14 U/L (ref 5–34)
Albumin: 3.8 g/dL (ref 3.5–5.0)
Anion Gap: 12 mEq/L — ABNORMAL HIGH (ref 3–11)
BILIRUBIN TOTAL: 0.36 mg/dL (ref 0.20–1.20)
BUN: 12.1 mg/dL (ref 7.0–26.0)
CO2: 25 mEq/L (ref 22–29)
CREATININE: 1.2 mg/dL — AB (ref 0.6–1.1)
Calcium: 8.9 mg/dL (ref 8.4–10.4)
Chloride: 106 mEq/L (ref 98–109)
GLUCOSE: 144 mg/dL — AB (ref 70–140)
Potassium: 3.6 mEq/L (ref 3.5–5.1)
SODIUM: 143 meq/L (ref 136–145)
TOTAL PROTEIN: 7.2 g/dL (ref 6.4–8.3)

## 2013-07-28 LAB — CBC WITH DIFFERENTIAL/PLATELET
BASO%: 0.3 % (ref 0.0–2.0)
Basophils Absolute: 0 10*3/uL (ref 0.0–0.1)
EOS ABS: 0.1 10*3/uL (ref 0.0–0.5)
EOS%: 2.3 % (ref 0.0–7.0)
HCT: 32.1 % — ABNORMAL LOW (ref 34.8–46.6)
HGB: 10.3 g/dL — ABNORMAL LOW (ref 11.6–15.9)
LYMPH%: 26.5 % (ref 14.0–49.7)
MCH: 28.6 pg (ref 25.1–34.0)
MCHC: 32.2 g/dL (ref 31.5–36.0)
MCV: 88.6 fL (ref 79.5–101.0)
MONO#: 0.4 10*3/uL (ref 0.1–0.9)
MONO%: 7.2 % (ref 0.0–14.0)
NEUT%: 63.7 % (ref 38.4–76.8)
NEUTROS ABS: 3.4 10*3/uL (ref 1.5–6.5)
PLATELETS: 234 10*3/uL (ref 145–400)
RBC: 3.62 10*6/uL — AB (ref 3.70–5.45)
RDW: 15.8 % — ABNORMAL HIGH (ref 11.2–14.5)
WBC: 5.3 10*3/uL (ref 3.9–10.3)
lymph#: 1.4 10*3/uL (ref 0.9–3.3)

## 2013-07-28 MED ORDER — DIPHENHYDRAMINE HCL 25 MG PO CAPS
ORAL_CAPSULE | ORAL | Status: AC
  Filled 2013-07-28: qty 1

## 2013-07-28 MED ORDER — TRASTUZUMAB CHEMO INJECTION 440 MG
6.0000 mg/kg | Freq: Once | INTRAVENOUS | Status: AC
  Administered 2013-07-28: 672 mg via INTRAVENOUS
  Filled 2013-07-28: qty 32

## 2013-07-28 MED ORDER — SODIUM CHLORIDE 0.9 % IJ SOLN
10.0000 mL | INTRAMUSCULAR | Status: DC | PRN
  Administered 2013-07-28: 10 mL
  Filled 2013-07-28: qty 10

## 2013-07-28 MED ORDER — ACETAMINOPHEN 325 MG PO TABS
650.0000 mg | ORAL_TABLET | Freq: Once | ORAL | Status: AC
  Administered 2013-07-28: 650 mg via ORAL

## 2013-07-28 MED ORDER — ACETAMINOPHEN 325 MG PO TABS
ORAL_TABLET | ORAL | Status: AC
  Filled 2013-07-28: qty 2

## 2013-07-28 MED ORDER — HEPARIN SOD (PORK) LOCK FLUSH 100 UNIT/ML IV SOLN
500.0000 [IU] | Freq: Once | INTRAVENOUS | Status: AC | PRN
  Administered 2013-07-28: 500 [IU]
  Filled 2013-07-28: qty 5

## 2013-07-28 MED ORDER — SODIUM CHLORIDE 0.9 % IV SOLN
Freq: Once | INTRAVENOUS | Status: AC
  Administered 2013-07-28: 11:00:00 via INTRAVENOUS

## 2013-07-28 MED ORDER — DIPHENHYDRAMINE HCL 25 MG PO CAPS
25.0000 mg | ORAL_CAPSULE | Freq: Once | ORAL | Status: AC
  Administered 2013-07-28: 25 mg via ORAL

## 2013-07-28 NOTE — Progress Notes (Signed)
[place]  [phone] [phone]     ID: [name] OB: 1955-08-28  MR#: [id number]  SEG#:[id number]  PCP: Rubbie Battiest, MD GYN:   SU: Dr. Erroll Luna OTHER MD: Dr. Thea Silversmith  CHIEF COMPLAINT:  Locally advanced breast cancer TREATMENT: Receiving anti-HER-2 immunotherapy; to start radiation therapy  BREAST CANCER HISTORY: The patient herself noted a change in her right breast November 2014, but did not tell her family until after Christmas at the year. They "pretty much forced me" to have a mammogram and bilateral diagnostic mammography and right ultrasound 01/19/2013 at the breast Center showed an irregular mass associated with pleomorphic calcifications in the upper-outer quadrant of the right breast measuring 1.6 cm. There was an abnormal appearing right axillary lymph node. On physical exam, there was a movable firm palpable mass in the right breast measuring approximately 2 cm by palpation. Ultrasound showed this to be an irregularly marginated hypoechoic mass measuring 1.8 cm. In the right axilla the ultrasound showed a 1.3 cm abnormal appearing level I lymph node (loss a fatty hilum).  The 01/26/2013 the patient underwent biopsy of both the right breast mass in question and a suspicious right axillary lymph node. This showed ([id number]) both the breast mass and axillary lymph node 2 be positive for invasive ductal carcinoma, grade 2 or 3, estrogen receptor 99% positive, progesterone receptor 95% positive, both with strong staining intensity, with an MIB-1 of 70%, and HER-2 amplified at 3+.  The patient was unable to undergo MRI because of claustrophobia concerns. Her subsequent history is as detailed below.  INTERVAL HISTORY: Camree returns today for followup of her breast cancer accompanied by her daughter [name]. The patient had been receiving Docetaxel, Carboplatin, Trastuzumab and Pertuzumab every 3 weeks, with Neulasta support, and  completed 4 cycles at that point her treatment had to be interrupted because of acute renal failure, multiple electrolyte abnormalities, and cytopenias requiring admission 05/08/2013. With supportive care the patient's condition returned to baseline and she resumed anti-HER-2 treatment only 05/26/2013 (pertuzumab and trastuzumab). She is establishing herself on my service today  REVIEW OF SYSTEMS: The main problem she had from chemotherapy were fatigue and diarrhea. Even though the pertuzumab is being continued, the diarrhea has now "not as bad". She tolerated her most recent cycle "fine". She sometimes forgets to take her Tums to keep up her calcium level. She denies any symptoms suggestive of heart failure, rash, fever, or unusual pain. She does have some abdominal cramps, and chronic stress urinary incontinence. She has mild peripheral neuropathy. A detailed review of systems today was otherwise noncontributory  PAST MEDICAL HISTORY: Past Medical History  Diagnosis Date  . Hyperlipidemia   . Obesity   . Asthma     prn neb. and inhaler  . Osteoarthritis 2003    left knee  . Non-insulin dependent type 2 diabetes mellitus   . Graves' disease   . Ophthalmic manifestation of Graves disease   . Hypertension     on multiple meds., has been on med. > 14 yr.  . Dehydration 04/13/2013  . Breast cancer 01/2013    right  . Wears glasses   . GERD (gastroesophageal reflux disease)     PAST SURGICAL HISTORY: Past Surgical History  Procedure Laterality Date  . Total knee arthroplasty Left 2003  . Total thyroidectomy    . Colonoscopy  2007  . Tubal ligation  1985  . Colonoscopy w/ polypectomy  2009  . Portacath placement Right 02/17/2013  Procedure: INSERTION PORT-A-CATH;  Surgeon: Joyice Faster. Cornett, MD;  Location: [facility];  Service: General;  Laterality: Right;    FAMILY HISTORY Family History  Problem Relation Age of Onset  . Heart disease Father   . Lung cancer Father    . Diabetes Mother    the patient's father died from lung cancer at the age of 38. The patient's mother died from thyroid cancer at the age of 75. The patient had 2 brothers, 2 sisters. There is no other history of breast or ovarian cancer in the family to her knowledge  GYN HISTORY: Menarche age 69, first live birth age 83. The patient is GX P2. She went through the change of life approximately age 18. She did not take hormone replacement.  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 She is now disabled secondary to her knee problems. She is divorced. At home she lives with her 2 daughters, [name] who works in direct supports to mentally retarded children, and [name], who is a group Games developer for mentally retarded children. The patient has no grandchildren. She attends a [employer]  ADVANCED DIRECTIVES: Not in place; these have been discussed with the patient, most recently in the 07/28/2013 visit.  HEALTH MAINTENANCE: History  Substance Use Topics  . Smoking status: Former Smoker -- 20 years    Quit date: 02/10/1991  . Smokeless tobacco: Never Used  . Alcohol Use: No      Allergies  Allergen Reactions  . Procardia [Nifedipine] Other (See Comments)    MIGRAINES    Current Outpatient Prescriptions  Medication Sig Dispense Refill  . ACCU-CHEK SMARTVIEW test strip USE ONE STRIP TO CHECK GLUCOSE 4 TIMES DAILY  100 each  5  . albuterol (PROVENTIL HFA;VENTOLIN HFA) 108 (90 BASE) MCG/ACT inhaler Inhale 2 puffs into the lungs every 4 (four) hours as needed.  18 g  2  . albuterol (PROVENTIL) (5 MG/ML) 0.5% nebulizer solution Take 2.5 mg by nebulization every 6 (six) hours as needed for wheezing or shortness of breath.      Marland Kitchen amLODipine (NORVASC) 10 MG tablet TAKE ONE TABLET BY MOUTH ONCE DAILY.  90 tablet  1  . Artificial Tear Ointment (ARTIFICIAL TEARS) ointment Place 1 drop into both eyes as needed (for grey eye  disease).       Marland Kitchen aspirin 81 MG tablet Take 81 mg by mouth daily.      . calcium citrate (CALCITRATE - DOSED IN MG ELEMENTAL CALCIUM) 950 MG tablet Take 1 tablet (200 mg of elemental calcium total) by mouth 2 (two) times daily after a meal.      . cloNIDine (CATAPRES - DOSED IN MG/24 HR) 0.2 mg/24hr patch Place 0.2 mg onto the skin once a week. Mondays.      . cycloSPORINE (RESTASIS) 0.05 % ophthalmic emulsion Place 1 drop into both eyes 2 (two) times daily.       Marland Kitchen gabapentin (NEURONTIN) 100 MG capsule Take 1 capsule (100 mg total) by mouth 3 (three) times daily.  90 capsule  5  . hydrochlorothiazide (HYDRODIURIL) 25 MG tablet TAKE ONE TABLET BY MOUTH ONCE DAILY.  30 tablet  5  . ketoconazole (NIZORAL) 2 % cream Apply 1 application topically 2 (two) times daily.  60 g  0  . labetalol (NORMODYNE) 300 MG tablet TAKE (1) TABLET BY MOUTH TWICE DAILY.  60 tablet  5  . levothyroxine (SYNTHROID, LEVOTHROID) 125 MCG  tablet Take 1 tablet (125 mcg total) by mouth daily before breakfast.  30 tablet  1  . LORazepam (ATIVAN) 0.5 MG tablet Take 1 tablet (0.5 mg total) by mouth every 8 (eight) hours.  30 tablet  0  . metFORMIN (GLUCOPHAGE) 500 MG tablet TAKE (1) TABLET BY MOUTH TWICE A DAY WITH MEALS (BREAKFAST AND SUPPER).  60 tablet  2  . omeprazole (PRILOSEC) 20 MG capsule Take 1 capsule (20 mg total) by mouth daily.  30 capsule  1  . ondansetron (ZOFRAN ODT) 4 MG disintegrating tablet Take 1 tablet (4 mg total) by mouth every 8 (eight) hours as needed for nausea.  10 tablet  0  . oxyCODONE-acetaminophen (ROXICET) 5-325 MG per tablet Take 1-2 tablets by mouth every 4 (four) hours as needed.  30 tablet  0  . potassium chloride SA (K-DUR,KLOR-CON) 20 MEQ tablet Take 1 tablet (20 mEq total) by mouth 2 (two) times daily.  10 tablet  0  . pravastatin (PRAVACHOL) 40 MG tablet Take 40 mg by mouth 2 (two) times daily at 8 am and 10 pm.      . prochlorperazine (COMPAZINE) 10 MG tablet Take 10 mg by mouth daily as  needed for nausea or vomiting.       . vitamin B-12 (CYANOCOBALAMIN) 500 MCG tablet Take 500 mcg by mouth daily.       No current facility-administered medications for this visit.    OBJECTIVE: Middle-aged [place] American woman who appears older than stated age 58 Vitals:   07/28/13 0943  BP: 148/66  Pulse: 81  Temp: 98.7 F (37.1 C)  Resp: 18     Body mass index is 42.51 kg/(m^2).     Sclerae unicteric, pupils equal and reactive Oropharynx clear and moist, full upper plate No cervical or supraclavicular adenopathy Lungs no rales or rhonchi Heart regular rate and rhythm Abd soft, obese, nontender, positive bowel sounds MSK no focal spinal tenderness, no upper extremity lymphedema Neuro: nonfocal, well oriented, friendly affect Breasts: I do not palpate a mass in the right breast. There are no skin or nipple changes of concern. The right axilla is benign. Left breast is unremarkable.  LAB RESULTS:  CMP     Component Value Date/Time   NA 144 07/06/2013 1351   NA 141 05/11/2013 0425   K 3.6 07/06/2013 1351   K 3.1* 05/11/2013 0425   CL 105 05/11/2013 0425   CO2 24 07/06/2013 1351   CO2 21 05/11/2013 0425   GLUCOSE 159* 07/06/2013 1351   GLUCOSE 113* 05/11/2013 0425   BUN 14.5 07/06/2013 1351   BUN 3* 05/11/2013 0425   CREATININE 1.4* 07/06/2013 1351   CREATININE 1.10 05/11/2013 0425   CREATININE 1.28* 05/06/2013 1149   CALCIUM 9.3 07/06/2013 1351   CALCIUM 6.7* 05/11/2013 0425   CALCIUM 6.4* 05/08/2013 1215   PROT 7.5 07/06/2013 1351   PROT 5.9* 05/11/2013 0425   ALBUMIN 3.8 07/06/2013 1351   ALBUMIN 3.3* 05/11/2013 0425   AST 13 07/06/2013 1351   AST 19 05/11/2013 0425   ALT 8 07/06/2013 1351   ALT 14 05/11/2013 0425   ALKPHOS 93 07/06/2013 1351   ALKPHOS 94 05/11/2013 0425   BILITOT 0.32 07/06/2013 1351   BILITOT 0.5 05/11/2013 0425   GFRNONAA 54* 05/11/2013 0425   GFRAA 63* 05/11/2013 0425    I No results found for this basename: SPEP,  UPEP,   kappa and lambda light chains     Lab Results  Component Value  Date   WBC 5.3 07/28/2013   NEUTROABS 3.4 07/28/2013   HGB 10.3* 07/28/2013   HCT 32.1* 07/28/2013   MCV 88.6 07/28/2013   PLT 234 07/28/2013      Chemistry      Component Value Date/Time   NA 144 07/06/2013 1351   NA 141 05/11/2013 0425   K 3.6 07/06/2013 1351   K 3.1* 05/11/2013 0425   CL 105 05/11/2013 0425   CO2 24 07/06/2013 1351   CO2 21 05/11/2013 0425   BUN 14.5 07/06/2013 1351   BUN 3* 05/11/2013 0425   CREATININE 1.4* 07/06/2013 1351   CREATININE 1.10 05/11/2013 0425   CREATININE 1.28* 05/06/2013 1149      Component Value Date/Time   CALCIUM 9.3 07/06/2013 1351   CALCIUM 6.7* 05/11/2013 0425   CALCIUM 6.4* 05/08/2013 1215   ALKPHOS 93 07/06/2013 1351   ALKPHOS 94 05/11/2013 0425   AST 13 07/06/2013 1351   AST 19 05/11/2013 0425   ALT 8 07/06/2013 1351   ALT 14 05/11/2013 0425   BILITOT 0.32 07/06/2013 1351   BILITOT 0.5 05/11/2013 0425       No results found for this basename: LABCA2    No components found with this basename: [id number]    No results found for this basename: INR,  in the last 168 hours  Urinalysis    Component Value Date/Time   COLORURINE YELLOW 05/02/2013 1911   APPEARANCEUR HAZY* 05/02/2013 1911   LABSPEC 1.015 05/02/2013 1911   PHURINE 7.5 05/02/2013 1911   GLUCOSEU NEGATIVE 05/02/2013 1911   HGBUR MODERATE* 05/02/2013 1911   BILIRUBINUR NEGATIVE 05/02/2013 1911   KETONESUR NEGATIVE 05/02/2013 1911   PROTEINUR NEGATIVE 05/02/2013 1911   UROBILINOGEN 0.2 05/02/2013 1911   NITRITE NEGATIVE 05/02/2013 1911   LEUKOCYTESUR SMALL* 05/02/2013 1911    STUDIES: Nm Sentinel Node Inj-no Rpt (breast)  07/19/2013   CLINICAL DATA: RIGHT BREAST CANCER   Sulfur colloid was injected intradermally by the nuclear medicine  technologist for breast cancer sentinel node localization.    Mm Breast Surgical Specimen  07/19/2013   CLINICAL DATA:  Patient status post right breast lumpectomy.  EXAM: SPECIMEN RADIOGRAPH OF THE RIGHT BREAST  COMPARISON:   Previous exam(s)  FINDINGS: Status post excision of the right breast. The radioactive seed and biopsy marker clip are present and are marked for pathology.  IMPRESSION: Specimen radiograph of the right breast.   Electronically Signed   By: Lovey Newcomer M.D.   On: 07/19/2013 09:22   Mm Rt Plc Breast Loc Dev   1st Lesion  Inc Mammo Guide  07/14/2013   CLINICAL DATA:  Patient with history of right breast invasive ductal carcinoma status post neoadjuvant chemotherapy. For preoperative localization.  EXAM: MAMMOGRAPHIC GUIDED RADIOACTIVE SEED LOCALIZATION OF THE RIGHT BREAST  COMPARISON:  Previous exam(s)  FINDINGS: Patient presents for radioactive seed localization prior to right breast lumpectomy. I met with the patient and we discussed the procedure of seed localization including benefits and alternatives. We discussed the high likelihood of a successful procedure. We discussed the risks of the procedure including infection, bleeding, tissue injury and further surgery. We discussed the low dose of radioactivity involved in the procedure. Informed, written consent was given.  The usual time-out protocol was performed immediately prior to the procedure.  Using mammographic guidance, sterile technique, 2% lidocaine and an I-125 radioactive seed, biopsy marking clip within the lateral right breast was localized using a lateral approach. The follow-up mammogram images confirm the seed  in the expected location and are marked for Dr. Brantley Stage.  Follow-up survey of the patient confirms presence of radioactive seed adjacent to the biopsy marking clip.  Order number of I-125 seed:  [id number].  Dose of I-125 seed:  0.250 mCi.  The patient tolerated the procedure well and was released from the [facility]. She was given instructions regarding seed removal.  IMPRESSION: Radioactive seed localization biopsy marking clip within the lateral right breast. No apparent complications.   Electronically Signed   By: Lovey Newcomer M.D.    On: 07/14/2013 13:04    ASSESSMENT: 58 y.o. [place] woman   (1) status post right breast and right axillary lymph node biopsy 01/26/2013,  for a clinical T1c N1, stage IIA invasive ductal carcinoma, grade 3, estrogen and progesterone receptor positive, HER-2 amplified by immunohistochemistry (3+), with an MIB-1 of 70%.  (2) treated neo-adjuvantly with 4 of 6 planned cycles of docetaxel, carboplatin, trastuzumab,and pertuzumab, last dose 04/27/2013   (a) final 2 cycles of chemotherapy omitted in the face of multiple renal and electrolyte abnormalities    (b) pertuzumab and trastuzumab to be continued until definitive surgery  (c) trastuzumab alone to be continued to complete one year of anti-HER-2 treatment  (d) echocardiograms every 3 months while on trastuzumab; most recent study 07/11/2013  (3) status post right lumpectomy and sentinel lymph node sampling 07/19/2013 showing a complete pathologic response (ypT0 ypN0)  (4) adjuvant radiation to follow surgery  (5) antiestrogen therapy to follow radiation  PLAN: Ms. Palmer was able to complete 4 cycles of chemotherapy together with her anti-HER-2 immunotherapy. Her MRI just obtained shows a complete radiologic response; this correlates with 90% accuracy to a complete pathologic response.  She is now ready to proceed to surgery. I suspect her axillary lymph node will have become negative and if so she will be a very good candidate for NSABP B-51, which randomizes patients in that situation to adjuvant radiation versus no adjuvant radiation.  Of course she needs to continue anti-HER-2 treatment. This will consist of both trastuzumab and pertuzumab until she has her surgery, and after the surgery it will be trastuzumab alone, to complete a year. She will need a repeat echocardiogram sometime in June.  Once her local treatment is completed, she will start anastrozole.  The patient has a good understanding of the overall plan. She agrees  with it. She knows a goal of treatment in her cases cure. She will call with any problems that may develop before her next visit  07/28/2013 9:49 AM Chauncey Cruel, MD

## 2013-07-28 NOTE — Patient Instructions (Signed)
[facility] Discharge Instructions for Patients Receiving Chemotherapy  Today you received the following chemotherapy agents Herceptin  To help prevent nausea and vomiting after your treatment, we encourage you to take your nausea medication     If you develop nausea and vomiting that is not controlled by your nausea medication, call the clinic.   BELOW ARE SYMPTOMS THAT SHOULD BE REPORTED IMMEDIATELY:  *FEVER GREATER THAN 100.5 F  *CHILLS WITH OR WITHOUT FEVER  NAUSEA AND VOMITING THAT IS NOT CONTROLLED WITH YOUR NAUSEA MEDICATION  *UNUSUAL SHORTNESS OF BREATH  *UNUSUAL BRUISING OR BLEEDING  TENDERNESS IN MOUTH AND THROAT WITH OR WITHOUT PRESENCE OF ULCERS  *URINARY PROBLEMS  *BOWEL PROBLEMS  UNUSUAL RASH Items with * indicate a potential emergency and should be followed up as soon as possible.  Feel free to call the clinic you have any questions or concerns. The clinic phone number is [phone].    

## 2013-07-28 NOTE — Telephone Encounter (Signed)
Per [facility] staff message scheduled appts. Advised scheduler 

## 2013-07-28 NOTE — Progress Notes (Signed)
[place]  Telephone:(336) [phone] Fax:(336) [phone]     ID: [name] OB: 1955-09-25  MR#: [id number]  EXH#:[id number]  PCP: Lacey Battiest, MD GYN:   SU: Dr. Erroll Jackson OTHER MD: Dr. Thea Jackson  CHIEF COMPLAINT:  Locally advanced breast cancer TREATMENT: Receiving anti-HER-2 immunotherapy; to start radiation therapy  BREAST CANCER HISTORY: From Dr [name] original intake note:  The patient herself noted a change in her right breast November 2014, but did not tell her family until after Christmas at the year. They "pretty much forced me" to have a mammogram and bilateral diagnostic mammography and right ultrasound 01/19/2013 at the breast Center showed an irregular mass associated with pleomorphic calcifications in the upper-outer quadrant of the right breast measuring 1.6 cm. There was an abnormal appearing right axillary lymph node. On physical exam, there was a movable firm palpable mass in the right breast measuring approximately 2 cm by palpation. Ultrasound showed this to be an irregularly marginated hypoechoic mass measuring 1.8 cm. In the right axilla the ultrasound showed a 1.3 cm abnormal appearing level I lymph node (loss a fatty hilum).  The 01/26/2013 the patient underwent biopsy of both the right breast mass in question and a suspicious right axillary lymph node. This showed ([id number]) both the breast mass and axillary lymph node 2 be positive for invasive ductal carcinoma, grade 2 or 3, estrogen receptor 99% positive, progesterone receptor 95% positive, both with strong staining intensity, with an MIB-1 of 70%, and HER-2 amplified at 3+.The patient was unable to undergo MRI because of claustrophobia concerns.   Her subsequent history is as detailed below.  INTERVAL HISTORY: [name] returns today for followup of her breast cancer accompanied by her daughter [name]. Since her last visit here the patient has undergone right lumpectomy and  sentinel lymph node sampling (07/19/2013, [id number]) showing a complete pathologic response--there was no residual ductal carcinoma in the breast and all 5 sentinel lymph nodes were clear. The patient is now ready to consider radiation. She is also continuing the trastuzumab today and every 3 weeks until she completes one year of treatment  REVIEW OF SYSTEMS: [name] tolerated the surgery well. She has had no significant pain, no bleeding, no fever. She has minimal swelling in the right axilla. Her major issue remains her knees, which is what has qualify her for disability. There have been no unusual headaches, visual changes, nausea, vomiting, or dizziness. She denies cough or phlegm production. There has been no change in bowel or bladder habits. A detailed review of systems today was otherwise negative.  PAST MEDICAL HISTORY: Past Medical History  Diagnosis Date  . Hyperlipidemia   . Obesity   . Asthma     prn neb. and inhaler  . Osteoarthritis 2003    left knee  . Non-insulin dependent type 2 diabetes mellitus   . Graves' disease   . Ophthalmic manifestation of Graves disease   . Hypertension     on multiple meds., has been on med. > 14 yr.  . Dehydration 04/13/2013  . Breast cancer 01/2013    right  . Wears glasses   . GERD (gastroesophageal reflux disease)     PAST SURGICAL HISTORY: Past Surgical History  Procedure Laterality Date  . Total knee arthroplasty Left 2003  . Total thyroidectomy    . Colonoscopy  2007  . Tubal ligation  1985  . Colonoscopy w/ polypectomy  2009  . Portacath placement Right 02/17/2013    Procedure: INSERTION  PORT-A-CATH;  Surgeon: Joyice Faster. Cornett, MD;  Location: [facility];  Service: General;  Laterality: Right;    FAMILY HISTORY Family History  Problem Relation Age of Onset  . Heart disease Father   . Lung cancer Father   . Diabetes Mother    the patient's father died from lung cancer at the age of 106. The patient's mother died  from thyroid cancer at the age of 66. The patient had 2 brothers, 2 sisters. There is no other history of breast or ovarian cancer in the family to her knowledge  GYN HISTORY: Menarche age 65, first live birth age 23. The patient is GX P2. She went through the change of life approximately age 32. She did not take hormone replacement.  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 She is now disabled secondary to her knee problems. She is divorced. At home she lives with her 2 daughters, [name] who works in direct supports to mentally retarded children, and [name], who is a group Games developer for mentally retarded children. The patient has no grandchildren. She attends a [employer]  ADVANCED DIRECTIVES: Not in place; these have been discussed with the patient, most recently in the 07/28/2013 visit.  HEALTH MAINTENANCE: History  Substance Use Topics  . Smoking status: Former Smoker -- 20 years    Quit date: 02/10/1991  . Smokeless tobacco: Never Used  . Alcohol Use: No      Allergies  Allergen Reactions  . Procardia [Nifedipine] Other (See Comments)    MIGRAINES    Current Outpatient Prescriptions  Medication Sig Dispense Refill  . ACCU-CHEK SMARTVIEW test strip USE ONE STRIP TO CHECK GLUCOSE 4 TIMES DAILY  100 each  5  . albuterol (PROVENTIL HFA;VENTOLIN HFA) 108 (90 BASE) MCG/ACT inhaler Inhale 2 puffs into the lungs every 4 (four) hours as needed.  18 g  2  . albuterol (PROVENTIL) (5 MG/ML) 0.5% nebulizer solution Take 2.5 mg by nebulization every 6 (six) hours as needed for wheezing or shortness of breath.      Marland Kitchen amLODipine (NORVASC) 10 MG tablet TAKE ONE TABLET BY MOUTH ONCE DAILY.  90 tablet  1  . Artificial Tear Ointment (ARTIFICIAL TEARS) ointment Place 1 drop into both eyes as needed (for grey eye disease).       Marland Kitchen aspirin 81 MG tablet Take 81 mg by mouth daily.      . calcium citrate (CALCITRATE - DOSED  IN MG ELEMENTAL CALCIUM) 950 MG tablet Take 1 tablet (200 mg of elemental calcium total) by mouth 2 (two) times daily after a meal.      . cloNIDine (CATAPRES - DOSED IN MG/24 HR) 0.2 mg/24hr patch Place 0.2 mg onto the skin once a week. Mondays.      . cycloSPORINE (RESTASIS) 0.05 % ophthalmic emulsion Place 1 drop into both eyes 2 (two) times daily.       Marland Kitchen gabapentin (NEURONTIN) 100 MG capsule Take 1 capsule (100 mg total) by mouth 3 (three) times daily.  90 capsule  5  . hydrochlorothiazide (HYDRODIURIL) 25 MG tablet TAKE ONE TABLET BY MOUTH ONCE DAILY.  30 tablet  5  . ketoconazole (NIZORAL) 2 % cream Apply 1 application topically 2 (two) times daily.  60 g  0  . labetalol (NORMODYNE) 300 MG tablet TAKE (1) TABLET BY MOUTH TWICE DAILY.  60 tablet  5  . levothyroxine (SYNTHROID, LEVOTHROID) 125 MCG tablet Take  1 tablet (125 mcg total) by mouth daily before breakfast.  30 tablet  1  . LORazepam (ATIVAN) 0.5 MG tablet Take 1 tablet (0.5 mg total) by mouth every 8 (eight) hours.  30 tablet  0  . metFORMIN (GLUCOPHAGE) 500 MG tablet TAKE (1) TABLET BY MOUTH TWICE A DAY WITH MEALS (BREAKFAST AND SUPPER).  60 tablet  2  . omeprazole (PRILOSEC) 20 MG capsule Take 1 capsule (20 mg total) by mouth daily.  30 capsule  1  . ondansetron (ZOFRAN ODT) 4 MG disintegrating tablet Take 1 tablet (4 mg total) by mouth every 8 (eight) hours as needed for nausea.  10 tablet  0  . oxyCODONE-acetaminophen (ROXICET) 5-325 MG per tablet Take 1-2 tablets by mouth every 4 (four) hours as needed.  30 tablet  0  . potassium chloride SA (K-DUR,KLOR-CON) 20 MEQ tablet Take 1 tablet (20 mEq total) by mouth 2 (two) times daily.  10 tablet  0  . pravastatin (PRAVACHOL) 40 MG tablet Take 40 mg by mouth 2 (two) times daily at 8 am and 10 pm.      . prochlorperazine (COMPAZINE) 10 MG tablet Take 10 mg by mouth daily as needed for nausea or vomiting.       . vitamin B-12 (CYANOCOBALAMIN) 500 MCG tablet Take 500 mcg by mouth daily.        No current facility-administered medications for this visit.    OBJECTIVE: Middle-aged [place] American woman in no acute distress   Filed Vitals:   07/28/13 0943  BP: 148/66  Pulse: 81  Temp: 98.7 F (37.1 C)  Resp: 18     Body mass index is 42.51 kg/(m^2).      ECOG: 1  Sclerae unicteric, pupils are round and equal Oropharynx clear and moist No cervical or supraclavicular adenopathy Lungs no rales or rhonchi Heart regular rate and rhythm Abd soft, obese, nontender, positive bowel sounds MSK no focal spinal tenderness, no upper extremity lymphedema Neuro: nonfocal, well oriented, positive affect Breasts: The right breast is status post recent lumpectomy. The cosmetic result is excellent. There is no evidence of dehiscence, erythema, or tenderness. In the right axilla there is a small soft fluid collection, which is not tender or erythematous. The left breast is unremarkable    LAB RESULTS:  CMP     Component Value Date/Time   NA 144 07/06/2013 1351   NA 141 05/11/2013 0425   K 3.6 07/06/2013 1351   K 3.1* 05/11/2013 0425   CL 105 05/11/2013 0425   CO2 24 07/06/2013 1351   CO2 21 05/11/2013 0425   GLUCOSE 159* 07/06/2013 1351   GLUCOSE 113* 05/11/2013 0425   BUN 14.5 07/06/2013 1351   BUN 3* 05/11/2013 0425   CREATININE 1.4* 07/06/2013 1351   CREATININE 1.10 05/11/2013 0425   CREATININE 1.28* 05/06/2013 1149   CALCIUM 9.3 07/06/2013 1351   CALCIUM 6.7* 05/11/2013 0425   CALCIUM 6.4* 05/08/2013 1215   PROT 7.5 07/06/2013 1351   PROT 5.9* 05/11/2013 0425   ALBUMIN 3.8 07/06/2013 1351   ALBUMIN 3.3* 05/11/2013 0425   AST 13 07/06/2013 1351   AST 19 05/11/2013 0425   ALT 8 07/06/2013 1351   ALT 14 05/11/2013 0425   ALKPHOS 93 07/06/2013 1351   ALKPHOS 94 05/11/2013 0425   BILITOT 0.32 07/06/2013 1351   BILITOT 0.5 05/11/2013 0425   GFRNONAA 54* 05/11/2013 0425   GFRAA 63* 05/11/2013 0425    I No results found for this basename: SPEP,  UPEP,   kappa and lambda light chains     Lab Results  Component Value Date   WBC 5.3 07/28/2013   NEUTROABS 3.4 07/28/2013   HGB 10.3* 07/28/2013   HCT 32.1* 07/28/2013   MCV 88.6 07/28/2013   PLT 234 07/28/2013      Chemistry      Component Value Date/Time   NA 144 07/06/2013 1351   NA 141 05/11/2013 0425   K 3.6 07/06/2013 1351   K 3.1* 05/11/2013 0425   CL 105 05/11/2013 0425   CO2 24 07/06/2013 1351   CO2 21 05/11/2013 0425   BUN 14.5 07/06/2013 1351   BUN 3* 05/11/2013 0425   CREATININE 1.4* 07/06/2013 1351   CREATININE 1.10 05/11/2013 0425   CREATININE 1.28* 05/06/2013 1149      Component Value Date/Time   CALCIUM 9.3 07/06/2013 1351   CALCIUM 6.7* 05/11/2013 0425   CALCIUM 6.4* 05/08/2013 1215   ALKPHOS 93 07/06/2013 1351   ALKPHOS 94 05/11/2013 0425   AST 13 07/06/2013 1351   AST 19 05/11/2013 0425   ALT 8 07/06/2013 1351   ALT 14 05/11/2013 0425   BILITOT 0.32 07/06/2013 1351   BILITOT 0.5 05/11/2013 0425       No results found for this basename: LABCA2    No components found with this basename: CLEXN170    No results found for this basename: INR,  in the last 168 hours  Urinalysis    Component Value Date/Time   COLORURINE YELLOW 05/02/2013 1911   APPEARANCEUR HAZY* 05/02/2013 1911   LABSPEC 1.015 05/02/2013 1911   PHURINE 7.5 05/02/2013 1911   GLUCOSEU NEGATIVE 05/02/2013 1911   HGBUR MODERATE* 05/02/2013 1911   BILIRUBINUR NEGATIVE 05/02/2013 1911   KETONESUR NEGATIVE 05/02/2013 1911   PROTEINUR NEGATIVE 05/02/2013 1911   UROBILINOGEN 0.2 05/02/2013 1911   NITRITE NEGATIVE 05/02/2013 1911   LEUKOCYTESUR SMALL* 05/02/2013 1911    STUDIES: Nm Sentinel Node Inj-no Rpt (breast)  07/19/2013   CLINICAL DATA: RIGHT BREAST CANCER   Sulfur colloid was injected intradermally by the nuclear medicine  technologist for breast cancer sentinel node localization.    Mm Breast Surgical Specimen  07/19/2013   CLINICAL DATA:  Patient status post right breast lumpectomy.  EXAM: SPECIMEN RADIOGRAPH OF THE RIGHT BREAST  COMPARISON:   Previous exam(s)  FINDINGS: Status post excision of the right breast. The radioactive seed and biopsy marker clip are present and are marked for pathology.  IMPRESSION: Specimen radiograph of the right breast.   Electronically Signed   By: Lovey Newcomer M.D.   On: 07/19/2013 09:22   Mm Rt Plc Breast Loc Dev   1st Lesion  Inc Mammo Guide  07/14/2013   CLINICAL DATA:  Patient with history of right breast invasive ductal carcinoma status post neoadjuvant chemotherapy. For preoperative localization.  EXAM: MAMMOGRAPHIC GUIDED RADIOACTIVE SEED LOCALIZATION OF THE RIGHT BREAST  COMPARISON:  Previous exam(s)  FINDINGS: Patient presents for radioactive seed localization prior to right breast lumpectomy. I met with the patient and we discussed the procedure of seed localization including benefits and alternatives. We discussed the high likelihood of a successful procedure. We discussed the risks of the procedure including infection, bleeding, tissue injury and further surgery. We discussed the low dose of radioactivity involved in the procedure. Informed, written consent was given.  The usual time-out protocol was performed immediately prior to the procedure.  Using mammographic guidance, sterile technique, 2% lidocaine and an I-125 radioactive seed, biopsy marking clip within  the lateral right breast was localized using a lateral approach. The follow-up mammogram images confirm the seed in the expected location and are marked for Dr. Brantley Stage.  Follow-up survey of the patient confirms presence of radioactive seed adjacent to the biopsy marking clip.  Order number of I-125 seed:  [id number].  Dose of I-125 seed:  0.250 mCi.  The patient tolerated the procedure well and was released from the Breast Center. She was given instructions regarding seed removal.  IMPRESSION: Radioactive seed localization biopsy marking clip within the lateral right breast. No apparent complications.   Electronically Signed   By: Lovey Newcomer M.D.    On: 07/14/2013 13:04    ASSESSMENT: 58 y.o. [place] woman   (1) status post right breast and right axillary lymph node biopsy 01/26/2013,  for a clinical T1c N1, stage IIA invasive ductal carcinoma, grade 3, estrogen and progesterone receptor positive, HER-2 amplified by immunohistochemistry (3+), with an MIB-1 of 70%.  (2) treated neo-adjuvantly with 4 of 6 planned cycles of docetaxel, carboplatin, trastuzumab,and pertuzumab, last dose 04/27/2013   (a) final 2 cycles of chemotherapy omitted in the face of multiple renal and electrolyte abnormalities    (b) pertuzumab and trastuzumab to be continued until definitive surgery  (c) trastuzumab alone being continued to complete one year of anti-HER-2 treatment  (d) echocardiograms every 3 months while on trastuzumab; most recent study 07/11/2013  (3) status post right lumpectomy and sentinel lymph node sampling 07/19/2013 showing a complete pathologic response (ypT0 ypN0)  (4) adjuvant radiation to follow surgery-- the patient is considering the B-51 study  (5) antiestrogen therapy to follow radiation  PLAN: Aki had the best possible response to her neoadjuvant chemotherapy, namely a complete pathologic response, and this predicts a good prognosis long term. Of course she needs to continue her trastuzumab every 3 weeks until she completes one year of treatment.  Adjuvant radiation is the standard of care after lumpectomy. However we have a study which randomizes patients like Nuri to radiation versus observation. I have asked the study nurse to discuss this with the patient, who is very interested in participating, and the patient will also meet with Dr. Pablo Ledger in the near future to discuss this further.  Once the patient has completed local treatments, she will start antiestrogen therapy, most likely anastrozole.  Tomi Bamberger has a good understanding of the overall plan. She agrees with it. She knows a goal of treatment in her case is  cure. She will call with any problems that may develop before her next visit  07/28/2013 9:49 AM Chauncey Cruel, MD

## 2013-07-28 NOTE — Telephone Encounter (Signed)
per of to sdch appt-ECHO-sent to precert-sent emailt o MW to have trmts sch-adv pt daughter [name] i will call w/appts

## 2013-07-29 ENCOUNTER — Telehealth: Payer: Self-pay | Admitting: Oncology

## 2013-07-29 NOTE — Telephone Encounter (Signed)
per pof to sch pt appts & ECHO-cld & spoke to pt to give time & dte-adv pt will mail copy to her

## 2013-08-05 ENCOUNTER — Encounter (INDEPENDENT_AMBULATORY_CARE_PROVIDER_SITE_OTHER): Payer: Self-pay | Admitting: Surgery

## 2013-08-05 ENCOUNTER — Ambulatory Visit (INDEPENDENT_AMBULATORY_CARE_PROVIDER_SITE_OTHER): Payer: Medicare Other | Admitting: Surgery

## 2013-08-05 VITALS — BP 142/90 | HR 74 | Temp 98.0°F | Ht 64.0 in | Wt 253.0 lb

## 2013-08-05 DIAGNOSIS — Z9889 Other specified postprocedural states: Secondary | ICD-10-CM

## 2013-08-05 NOTE — Patient Instructions (Signed)
Return 4 months

## 2013-08-05 NOTE — Progress Notes (Signed)
[name]    [id number] 08/05/2013    [birth year]   CC:   Chief Complaint  Patient presents with  . Follow-up     HPI:  58 y.o. [place] woman  (1) status post right breast and right axillary lymph node biopsy 01/26/2013, for a clinical T1c N1, stage IIA invasive ductal carcinoma, grade 3, estrogen and progesterone receptor positive, HER-2 amplified by immunohistochemistry (3+), with an MIB-1 of 70%.  (2) treated neo-adjuvantly with 4 of 6 planned cycles of docetaxel, carboplatin, trastuzumab,and pertuzumab, last dose 04/27/2013  (a) final 2 cycles of chemotherapy omitted in the face of multiple renal and electrolyte abnormalities  (b) pertuzumab and trastuzumab to be continued until definitive surgery  (c) trastuzumab alone being continued to complete one year of anti-HER-2 treatment  (d) echocardiograms every 3 months while on trastuzumab; most recent study 07/11/2013  (3) status post right lumpectomy and sentinel lymph node sampling 07/19/2013 showing a complete pathologic response (ypT0 ypN0)  (4) adjuvant radiation to follow surgery-- the patient is considering the B-51 study  (5) antiestrogen therapy to follow radiation  PE: VITAL SIGNS: BP 142/90  Pulse 74  Temp(Src) 98 F (36.7 C)  Ht 5' 4"  (1.626 m)  Wt 253 lb (114.76 kg)  BMI 43.41 kg/m2  Breast: The incision is healing nicely and there is no evidence of infection or hematoma.    DATA REVIEWED: Pathology report: for a clinical T1c N1, stage IIA invasive ductal carcinoma, grade 3, estrogen and progesterone receptor positive, HER-2 amplified by immunohistochemistry (3+), with an MIB-1 of 70%.  status post right lumpectomy and sentinel lymph node sampling 07/19/2013 showing a complete pathologic response (ypT0 ypN0)    IMPRESSION: Patient doing well.   PLAN: Her next visit will be in 4 months.

## 2013-08-17 ENCOUNTER — Other Ambulatory Visit (HOSPITAL_BASED_OUTPATIENT_CLINIC_OR_DEPARTMENT_OTHER): Payer: Medicare Other

## 2013-08-17 ENCOUNTER — Encounter: Payer: Medicare Other | Admitting: *Deleted

## 2013-08-17 ENCOUNTER — Ambulatory Visit (HOSPITAL_BASED_OUTPATIENT_CLINIC_OR_DEPARTMENT_OTHER): Payer: Medicare Other

## 2013-08-17 VITALS — BP 134/82 | HR 68 | Temp 97.6°F | Resp 18

## 2013-08-17 DIAGNOSIS — C773 Secondary and unspecified malignant neoplasm of axilla and upper limb lymph nodes: Secondary | ICD-10-CM

## 2013-08-17 DIAGNOSIS — Z5112 Encounter for antineoplastic immunotherapy: Secondary | ICD-10-CM

## 2013-08-17 DIAGNOSIS — C50411 Malignant neoplasm of upper-outer quadrant of right female breast: Secondary | ICD-10-CM

## 2013-08-17 DIAGNOSIS — C50419 Malignant neoplasm of upper-outer quadrant of unspecified female breast: Secondary | ICD-10-CM

## 2013-08-17 LAB — CBC WITH DIFFERENTIAL/PLATELET
BASO%: 0.5 % (ref 0.0–2.0)
Basophils Absolute: 0 10*3/uL (ref 0.0–0.1)
EOS ABS: 0.1 10*3/uL (ref 0.0–0.5)
EOS%: 2.8 % (ref 0.0–7.0)
HEMATOCRIT: 32.6 % — AB (ref 34.8–46.6)
HGB: 10.5 g/dL — ABNORMAL LOW (ref 11.6–15.9)
LYMPH%: 29.5 % (ref 14.0–49.7)
MCH: 27.8 pg (ref 25.1–34.0)
MCHC: 32.3 g/dL (ref 31.5–36.0)
MCV: 86.2 fL (ref 79.5–101.0)
MONO#: 0.3 10*3/uL (ref 0.1–0.9)
MONO%: 6.4 % (ref 0.0–14.0)
NEUT%: 60.8 % (ref 38.4–76.8)
NEUTROS ABS: 3.2 10*3/uL (ref 1.5–6.5)
Platelets: 228 10*3/uL (ref 145–400)
RBC: 3.79 10*6/uL (ref 3.70–5.45)
RDW: 15.8 % — ABNORMAL HIGH (ref 11.2–14.5)
WBC: 5.2 10*3/uL (ref 3.9–10.3)
lymph#: 1.5 10*3/uL (ref 0.9–3.3)

## 2013-08-17 LAB — COMPREHENSIVE METABOLIC PANEL (CC13)
ALT: 8 U/L (ref 0–55)
ANION GAP: 12 meq/L — AB (ref 3–11)
AST: 16 U/L (ref 5–34)
Albumin: 3.8 g/dL (ref 3.5–5.0)
Alkaline Phosphatase: 92 U/L (ref 40–150)
BILIRUBIN TOTAL: 0.36 mg/dL (ref 0.20–1.20)
BUN: 14.6 mg/dL (ref 7.0–26.0)
CALCIUM: 9.1 mg/dL (ref 8.4–10.4)
CHLORIDE: 106 meq/L (ref 98–109)
CO2: 26 meq/L (ref 22–29)
CREATININE: 1.2 mg/dL — AB (ref 0.6–1.1)
Glucose: 164 mg/dl — ABNORMAL HIGH (ref 70–140)
Potassium: 3.7 mEq/L (ref 3.5–5.1)
Sodium: 143 mEq/L (ref 136–145)
Total Protein: 7.2 g/dL (ref 6.4–8.3)

## 2013-08-17 MED ORDER — SODIUM CHLORIDE 0.9 % IV SOLN
Freq: Once | INTRAVENOUS | Status: AC
  Administered 2013-08-17: 11:00:00 via INTRAVENOUS

## 2013-08-17 MED ORDER — TRASTUZUMAB CHEMO INJECTION 440 MG
6.0000 mg/kg | Freq: Once | INTRAVENOUS | Status: AC
  Administered 2013-08-17: 672 mg via INTRAVENOUS
  Filled 2013-08-17: qty 32

## 2013-08-17 MED ORDER — ACETAMINOPHEN 325 MG PO TABS
ORAL_TABLET | ORAL | Status: AC
  Filled 2013-08-17: qty 2

## 2013-08-17 MED ORDER — DIPHENHYDRAMINE HCL 25 MG PO CAPS
ORAL_CAPSULE | ORAL | Status: AC
  Filled 2013-08-17: qty 1

## 2013-08-17 MED ORDER — HEPARIN SOD (PORK) LOCK FLUSH 100 UNIT/ML IV SOLN
500.0000 [IU] | Freq: Once | INTRAVENOUS | Status: AC | PRN
  Administered 2013-08-17: 500 [IU]
  Filled 2013-08-17: qty 5

## 2013-08-17 MED ORDER — SODIUM CHLORIDE 0.9 % IJ SOLN
10.0000 mL | INTRAMUSCULAR | Status: DC | PRN
  Administered 2013-08-17: 10 mL
  Filled 2013-08-17: qty 10

## 2013-08-17 MED ORDER — ACETAMINOPHEN 325 MG PO TABS
650.0000 mg | ORAL_TABLET | Freq: Once | ORAL | Status: AC
Start: 2013-08-17 — End: 2013-08-17
  Administered 2013-08-17: 650 mg via ORAL

## 2013-08-17 MED ORDER — DIPHENHYDRAMINE HCL 25 MG PO CAPS
25.0000 mg | ORAL_CAPSULE | Freq: Once | ORAL | Status: AC
  Administered 2013-08-17: 25 mg via ORAL

## 2013-08-17 NOTE — Patient Instructions (Signed)
[facility] Discharge Instructions for Patients Receiving Chemotherapy  Today you received the following chemotherapy agent Herceptin.   If you develop nausea and vomiting that is not controlled by your nausea medication, call the clinic at [phone].  BELOW ARE SYMPTOMS THAT SHOULD BE REPORTED IMMEDIATELY:  *FEVER GREATER THAN 100.5 F  *CHILLS WITH OR WITHOUT FEVER  NAUSEA AND VOMITING THAT IS NOT CONTROLLED WITH YOUR NAUSEA MEDICATION  *UNUSUAL SHORTNESS OF BREATH  *UNUSUAL BRUISING OR BLEEDING  TENDERNESS IN MOUTH AND THROAT WITH OR WITHOUT PRESENCE OF ULCERS  *URINARY PROBLEMS  *BOWEL PROBLEMS  UNUSUAL RASH Items with * indicate a potential emergency and should be followed up as soon as possible.  Feel free to call the clinic you have any questions or concerns. The clinic phone number is [phone].

## 2013-08-19 ENCOUNTER — Telehealth: Payer: Self-pay | Admitting: Family Medicine

## 2013-08-19 MED ORDER — ALBUTEROL SULFATE (2.5 MG/3ML) 0.083% IN NEBU: 2.5000 mg | INHALATION_SOLUTION | Freq: Four times a day (QID) | RESPIRATORY_TRACT | Status: DC | PRN

## 2013-08-19 NOTE — Progress Notes (Signed)
Location of Breast Cancer: Right Breast upper-outer quadrant 1.8 cm/invasive ductal carcinoma grade 3.  Histology per Pathology Report:  07/19/2013 Diagnosis 1. Breast, lumpectomy - LOBULAR NEOPLASIA (ATYPICAL LOBULAR HYPERPLASIA). - CHANGES CONSISTENT WITH TREATMENT EFFECT. - DUCTAL CARCINOMA IS NOT HISTOLOGICALLY IDENTIFIED. - SEE COMMENT. 2. Lymph node, sentinel, biopsy, Right axillary - THERE IS NO EVIDENCE OF CARCINOMA IN 1 OF 1 LYMPH NODE (0/1). 3. Lymph node, sentinel, biopsy, Right axillary - THERE IS NO EVIDENCE OF CARCINOMA IN 1 OF 1 LYMPH NODE (0/1). 4. Lymph node, sentinel, biopsy, Right axillary - THERE IS NO EVIDENCE OF CARCINOMA IN 1 OF 1 LYMPH NODE (0/1). - TREATMENT RELATED CHANGES. 5. Lymph node, sentinel, biopsy, Right axillary - THERE IS NO EVIDENCE OF CARCINOMA IN 1 OF 1 LYMPH NODE (0/1). 6. Lymph node, sentinel, biopsy, Right axillary - THERE IS NO EVIDENCE OF CARCINOMA IN 1 OF 1 LYMPH NODE (0/1).  Receptor Status: ER(+), PR (+), Her2-neu (+)  Did patient present with symptoms (if so, please note symptoms) or was this found on screening mammography?: Mass found on palpation by patient later confirmed by ultrasound.  Past/Anticipated interventions by surgeon, if OTR:[id number] [place] NODE BIOPSY 07/19/2013  Past/Anticipated interventions by medical oncology, if FXO:VANVBTYOM 4 of 6 planned cycles of docetaxel, caraboplatin, trastuzuamab and pertuzumab. Last dose 04/27/2013. Last 2 cycles omitted secondary renal function abnormalities. Anti-estrogen to follow radiation.  Lymphedema issues, if any: no  Pain issues, if any: chronic left knee pain  SAFETY ISSUES:  Prior radiation? No   Pacemaker/ICD? No  Possible current pregnancy? No  Is the patient on methotrexate? No  Current Complaints / other details:Divorced. Disabled secondary to knee problems. Menses age 58, first pregnancy age 89, [place] P 2. Post  menopausal, last period in 2009 and no hormone replacement therapy. Considering B-51 study.  Allergies:Procardia Smoking History: quit in 1993.     Arlyss Repress, RN 08/19/2013,3:37 PM

## 2013-08-19 NOTE — Telephone Encounter (Signed)
Rx faxed to pharmacy. Patient notified. 

## 2013-08-19 NOTE — Telephone Encounter (Signed)
Need Rx for Albuterol 0.083% called to [facility]

## 2013-08-24 ENCOUNTER — Encounter: Payer: Self-pay | Admitting: Radiation Oncology

## 2013-08-24 ENCOUNTER — Ambulatory Visit
Admission: RE | Admit: 2013-08-24 | Discharge: 2013-08-24 | Disposition: A | Discharge status: Home / Self Care | Payer: Medicare Other | Source: Ambulatory Visit | Attending: Radiation Oncology | Admitting: Radiation Oncology

## 2013-08-24 ENCOUNTER — Encounter: Payer: Self-pay | Admitting: *Deleted

## 2013-08-24 VITALS — BP 138/71 | HR 76 | Temp 99.0°F | Resp 20 | Wt 251.9 lb

## 2013-08-24 DIAGNOSIS — Z79899 Other long term (current) drug therapy: Secondary | ICD-10-CM | POA: Insufficient documentation

## 2013-08-24 DIAGNOSIS — C50419 Malignant neoplasm of upper-outer quadrant of unspecified female breast: Secondary | ICD-10-CM | POA: Diagnosis not present

## 2013-08-24 DIAGNOSIS — C50411 Malignant neoplasm of upper-outer quadrant of right female breast: Secondary | ICD-10-CM

## 2013-08-24 DIAGNOSIS — Z9221 Personal history of antineoplastic chemotherapy: Secondary | ICD-10-CM | POA: Insufficient documentation

## 2013-08-24 DIAGNOSIS — Z510 Encounter for antineoplastic radiation therapy: Secondary | ICD-10-CM | POA: Diagnosis present

## 2013-08-24 NOTE — Progress Notes (Addendum)
Gave [name] miconazole powder per Dr. Pablo Ledger.  Advised her to apply it twice a day underneath her breasts.  [name] verbalized agreement.

## 2013-08-24 NOTE — Progress Notes (Signed)
Please see the Nurse Progress Note in the MD Initial Consult Encounter for this patient. 

## 2013-08-25 ENCOUNTER — Ambulatory Visit
Admission: RE | Admit: 2013-08-25 | Discharge: 2013-08-25 | Disposition: A | Discharge status: Home / Self Care | Payer: Medicare Other | Source: Ambulatory Visit | Attending: Radiation Oncology | Admitting: Radiation Oncology

## 2013-08-25 DIAGNOSIS — C50411 Malignant neoplasm of upper-outer quadrant of right female breast: Secondary | ICD-10-CM

## 2013-08-25 DIAGNOSIS — Z510 Encounter for antineoplastic radiation therapy: Secondary | ICD-10-CM | POA: Diagnosis not present

## 2013-08-25 NOTE — Progress Notes (Signed)
Department of Radiation Oncology  Phone:  [phone] Fax:        [phone]   Name: [name] MRN: [id number]  DOB: Feb 16, 1955  Date: 08/24/2013  Follow Up Visit Note  Diagnosis:    ICD-9-CM  1. Malignant neoplasm of upper-outer quadrant of female breast, right 174.4   Interval History: [name] presents today for routine followup.  She is feeling well. Her chemotherapy was truncated early due to diarrhea and dehydration. Despite this she obtained a pathologic complete response on her lumpectomy and SLN bx performed on 07/19/13.  The breast showed treatment effect and ALH.  0/5 SLN were positive. She has recovered well from her surgery. She had no postoperative pain. She has enrolled in NSABP B51 and has been randomized to the breast only (no regional nodal treatment) arm.  She is accompanied by her daughter today.   Allergies:  Allergies  Allergen Reactions  . Procardia [Nifedipine] Other (See Comments)    MIGRAINES    Medications:  Current Outpatient Prescriptions  Medication Sig Dispense Refill  . ACCU-CHEK SMARTVIEW test strip USE ONE STRIP TO CHECK GLUCOSE 4 TIMES DAILY  100 each  5  . albuterol (PROVENTIL HFA;VENTOLIN HFA) 108 (90 BASE) MCG/ACT inhaler Inhale 2 puffs into the lungs every 4 (four) hours as needed.  18 g  2  . albuterol (PROVENTIL) (2.5 MG/3ML) 0.083% nebulizer solution Take 3 mLs (2.5 mg total) by nebulization every 6 (six) hours as needed for wheezing or shortness of breath.  150 mL  0  . albuterol (PROVENTIL) (5 MG/ML) 0.5% nebulizer solution Take 2.5 mg by nebulization every 6 (six) hours as needed for wheezing or shortness of breath.      Marland Kitchen amLODipine (NORVASC) 10 MG tablet TAKE ONE TABLET BY MOUTH ONCE DAILY.  90 tablet  1  . Artificial Tear Ointment (ARTIFICIAL TEARS) ointment Place 1 drop into both eyes as needed (for grey eye disease).       Marland Kitchen aspirin 81 MG tablet Take 81 mg by mouth daily.      . calcium citrate (CALCITRATE - DOSED IN MG  ELEMENTAL CALCIUM) 950 MG tablet Take 1 tablet (200 mg of elemental calcium total) by mouth 2 (two) times daily after a meal.      . cloNIDine (CATAPRES - DOSED IN MG/24 HR) 0.2 mg/24hr patch Place 0.2 mg onto the skin once a week. Mondays.      . cycloSPORINE (RESTASIS) 0.05 % ophthalmic emulsion Place 1 drop into both eyes 2 (two) times daily.       Marland Kitchen gabapentin (NEURONTIN) 100 MG capsule Take 1 capsule (100 mg total) by mouth 3 (three) times daily.  90 capsule  5  . hydrochlorothiazide (HYDRODIURIL) 25 MG tablet TAKE ONE TABLET BY MOUTH ONCE DAILY.  30 tablet  5  . ketoconazole (NIZORAL) 2 % cream Apply 1 application topically 2 (two) times daily.  60 g  0  . labetalol (NORMODYNE) 300 MG tablet TAKE (1) TABLET BY MOUTH TWICE DAILY.  60 tablet  5  . levothyroxine (SYNTHROID, LEVOTHROID) 125 MCG tablet Take 1 tablet (125 mcg total) by mouth daily before breakfast.  30 tablet  1  . LORazepam (ATIVAN) 0.5 MG tablet Take 1 tablet (0.5 mg total) by mouth every 8 (eight) hours.  30 tablet  0  . metFORMIN (GLUCOPHAGE) 500 MG tablet TAKE (1) TABLET BY MOUTH TWICE A DAY WITH MEALS (BREAKFAST AND SUPPER).  60 tablet  2  . omeprazole (PRILOSEC) 20 MG  capsule Take 1 capsule (20 mg total) by mouth daily.  30 capsule  1  . ondansetron (ZOFRAN ODT) 4 MG disintegrating tablet Take 1 tablet (4 mg total) by mouth every 8 (eight) hours as needed for nausea.  10 tablet  0  . oxyCODONE-acetaminophen (ROXICET) 5-325 MG per tablet Take 1-2 tablets by mouth every 4 (four) hours as needed.  30 tablet  0  . potassium chloride SA (K-DUR,KLOR-CON) 20 MEQ tablet Take 1 tablet (20 mEq total) by mouth 2 (two) times daily.  10 tablet  0  . pravastatin (PRAVACHOL) 40 MG tablet Take 40 mg by mouth 2 (two) times daily at 8 am and 10 pm.      . prochlorperazine (COMPAZINE) 10 MG tablet Take 10 mg by mouth daily as needed for nausea or vomiting.       . vitamin B-12 (CYANOCOBALAMIN) 500 MCG tablet Take 500 mcg by mouth daily.      .  Vitamin D, Ergocalciferol, (DRISDOL) 50000 UNITS CAPS capsule Take 50,000 Units by mouth every 7 (seven) days.       No current facility-administered medications for this encounter.    Physical Exam:  Filed Vitals:   08/24/13 1500  BP: 138/71  Pulse: 76  Temp: 99 F (37.2 C)  Resp: 20  Weight: 251 lb 14.4 oz (114.261 kg)   Single incision in the right breast. Healing well. Normal range of motion.   IMPRESSION: [name] is a 58 y.o. female s/p neoadjuvant chemotherapy, lumpectomy revealing a patholgoic complete response, now ready for radiation.  PLAN:  We discussed the role of radiation and decreasing local failures in patients who undergo lumpectomy. We discussed the retrospective data showing an increase in failure rates in patients who have a pathologic complete response and did not undergo radiation. For this reason I have recommended radiation to the whole breast followed by boost to the tumor bed. We discussed the process of simulation the placement tattoos. We discussed possible side effects during treatment including but not limited to skin irritation darkness and fatigue. We discussed long-term effects of treatment which are extremely unlikely but possible including damage to the lungs and ribs. We discussed the low likelihood of secondary malignancies. I let her know that nursing would be doing skin teaching with her. She signed informed consent and agreed to proceed forward.      Thea Silversmith, MD

## 2013-08-25 NOTE — Progress Notes (Signed)
Name: ASPASIA RUDE   MRN: [id number]  Date:  08/25/2013  DOB: 08-04-1955  Status:outpatient    DIAGNOSIS: Breast cancer.  CONSENT VERIFIED: yes   SET UP: Patient is setup supine   IMMOBILIZATION:  The following immobilization was used:Custom Moldable Pillow, breast board.   NARRATIVE: Ms. Geppert was brought to the [place].  Identity was confirmed.  All relevant records and images related to the planned course of therapy were reviewed.  Then, the patient was positioned in a stable reproducible clinical set-up for radiation therapy.  Wires were placed to delineate the clinical extent of breast tissue. A wire was placed on the scar as well.  CT images were obtained.  An isocenter was placed. Skin markings were placed.  The CT images were loaded into the planning software where the target and avoidance structures were contoured.  The radiation prescription was entered and confirmed. The patient was discharged in stable condition and tolerated simulation well.    TREATMENT PLANNING NOTE:  Treatment planning then occurred. I have requested : MLC's, isodose plan, basic dose calculation  I personally designed and supervised the construction of 3 medically necessary complex treatment devices for the protection of critical normal structures including the lungs and contralateral breast as well as the immobilization device which is necessary for set up certainty.   3D conformal treatment planning is requested to evaluate dose volume histograms of the heart, lungs and tumor cavity.

## 2013-08-25 NOTE — Progress Notes (Signed)
Radiation Oncology         [phone]) [phone] ________________________________  Name: [name]      MRN: [id number]          Date: 08/25/2013              DOB: 07-05-1955  Optical Surface Tracking Plan:  Since intensity modulated radiotherapy (IMRT) and 3D conformal radiation treatment methods are predicated on accurate and precise positioning for treatment, intrafraction motion monitoring is medically necessary to ensure accurate and safe treatment delivery.  The ability to quantify intrafraction motion without excessive ionizing radiation dose can only be performed with optical surface tracking. Accordingly, surface imaging offers the opportunity to obtain 3D measurements of patient position throughout IMRT and 3D treatments without excessive radiation exposure.  I am ordering optical surface tracking for this patient's upcoming course of radiotherapy. ________________________________ Signature   Reference:   Ursula Alert, J, et al. Surface imaging-based analysis of intrafraction motion for breast radiotherapy patients.Journal of [place], n. 6, nov. 2014. ISSN [id number].   Available at: <http://www.jacmp.org/index.php/jacmp/article/view/4957>.

## 2013-09-01 ENCOUNTER — Other Ambulatory Visit: Payer: Self-pay | Admitting: Family Medicine

## 2013-09-01 DIAGNOSIS — Z510 Encounter for antineoplastic radiation therapy: Secondary | ICD-10-CM | POA: Diagnosis not present

## 2013-09-02 ENCOUNTER — Ambulatory Visit: Payer: Medicare Other | Admitting: Radiation Oncology

## 2013-09-05 ENCOUNTER — Ambulatory Visit
Admission: RE | Admit: 2013-09-05 | Discharge: 2013-09-05 | Disposition: A | Discharge status: Home / Self Care | Payer: Medicare Other | Source: Ambulatory Visit | Attending: Radiation Oncology | Admitting: Radiation Oncology

## 2013-09-05 DIAGNOSIS — Z510 Encounter for antineoplastic radiation therapy: Secondary | ICD-10-CM | POA: Diagnosis not present

## 2013-09-05 DIAGNOSIS — C50411 Malignant neoplasm of upper-outer quadrant of right female breast: Secondary | ICD-10-CM

## 2013-09-05 MED ORDER — ALRA NON-METALLIC DEODORANT (RAD-ONC)
1.0000 "application " | Freq: Once | TOPICAL | Status: AC
  Administered 2013-09-05: 1 via TOPICAL

## 2013-09-05 MED ORDER — RADIAPLEXRX EX GEL
Freq: Once | CUTANEOUS | Status: AC
  Administered 2013-09-05: 10:00:00 via TOPICAL

## 2013-09-06 ENCOUNTER — Ambulatory Visit: Payer: Medicare Other

## 2013-09-06 ENCOUNTER — Ambulatory Visit: Admission: RE | Admit: 2013-09-06 | Payer: Medicare Other | Source: Ambulatory Visit | Admitting: Radiation Oncology

## 2013-09-06 ENCOUNTER — Encounter (HOSPITAL_COMMUNITY): Payer: Self-pay | Admitting: Emergency Medicine

## 2013-09-06 ENCOUNTER — Inpatient Hospital Stay (HOSPITAL_COMMUNITY)
Admission: EM | Admit: 2013-09-06 | Discharge: 2013-09-11 | DRG: 872 | Disposition: A | Discharge status: Home / Self Care | Payer: Medicare Other | Attending: Internal Medicine | Admitting: Internal Medicine

## 2013-09-06 ENCOUNTER — Emergency Department (HOSPITAL_COMMUNITY): Payer: Medicare Other

## 2013-09-06 DIAGNOSIS — F17201 Nicotine dependence, unspecified, in remission: Secondary | ICD-10-CM

## 2013-09-06 DIAGNOSIS — Z6841 Body Mass Index (BMI) 40.0 and over, adult: Secondary | ICD-10-CM | POA: Diagnosis not present

## 2013-09-06 DIAGNOSIS — J45909 Unspecified asthma, uncomplicated: Secondary | ICD-10-CM | POA: Diagnosis present

## 2013-09-06 DIAGNOSIS — Z87891 Personal history of nicotine dependence: Secondary | ICD-10-CM

## 2013-09-06 DIAGNOSIS — I129 Hypertensive chronic kidney disease with stage 1 through stage 4 chronic kidney disease, or unspecified chronic kidney disease: Secondary | ICD-10-CM | POA: Diagnosis present

## 2013-09-06 DIAGNOSIS — R0789 Other chest pain: Secondary | ICD-10-CM

## 2013-09-06 DIAGNOSIS — Z853 Personal history of malignant neoplasm of breast: Secondary | ICD-10-CM | POA: Diagnosis not present

## 2013-09-06 DIAGNOSIS — E1142 Type 2 diabetes mellitus with diabetic polyneuropathy: Secondary | ICD-10-CM | POA: Diagnosis present

## 2013-09-06 DIAGNOSIS — N182 Chronic kidney disease, stage 2 (mild): Secondary | ICD-10-CM | POA: Diagnosis present

## 2013-09-06 DIAGNOSIS — D62 Acute posthemorrhagic anemia: Secondary | ICD-10-CM | POA: Diagnosis present

## 2013-09-06 DIAGNOSIS — E1149 Type 2 diabetes mellitus with other diabetic neurological complication: Secondary | ICD-10-CM | POA: Diagnosis present

## 2013-09-06 DIAGNOSIS — W19XXXA Unspecified fall, initial encounter: Secondary | ICD-10-CM | POA: Diagnosis present

## 2013-09-06 DIAGNOSIS — N19 Unspecified kidney failure: Secondary | ICD-10-CM

## 2013-09-06 DIAGNOSIS — Z7982 Long term (current) use of aspirin: Secondary | ICD-10-CM

## 2013-09-06 DIAGNOSIS — Z8249 Family history of ischemic heart disease and other diseases of the circulatory system: Secondary | ICD-10-CM

## 2013-09-06 DIAGNOSIS — M1990 Unspecified osteoarthritis, unspecified site: Secondary | ICD-10-CM | POA: Diagnosis present

## 2013-09-06 DIAGNOSIS — A419 Sepsis, unspecified organism: Secondary | ICD-10-CM | POA: Diagnosis present

## 2013-09-06 DIAGNOSIS — K219 Gastro-esophageal reflux disease without esophagitis: Secondary | ICD-10-CM | POA: Diagnosis present

## 2013-09-06 DIAGNOSIS — E0590 Thyrotoxicosis, unspecified without thyrotoxic crisis or storm: Secondary | ICD-10-CM

## 2013-09-06 DIAGNOSIS — E1169 Type 2 diabetes mellitus with other specified complication: Secondary | ICD-10-CM | POA: Diagnosis present

## 2013-09-06 DIAGNOSIS — D509 Iron deficiency anemia, unspecified: Secondary | ICD-10-CM

## 2013-09-06 DIAGNOSIS — Z888 Allergy status to other drugs, medicaments and biological substances status: Secondary | ICD-10-CM | POA: Diagnosis not present

## 2013-09-06 DIAGNOSIS — Z79899 Other long term (current) drug therapy: Secondary | ICD-10-CM

## 2013-09-06 DIAGNOSIS — Z825 Family history of asthma and other chronic lower respiratory diseases: Secondary | ICD-10-CM

## 2013-09-06 DIAGNOSIS — E860 Dehydration: Secondary | ICD-10-CM

## 2013-09-06 DIAGNOSIS — I10 Essential (primary) hypertension: Secondary | ICD-10-CM

## 2013-09-06 DIAGNOSIS — Z801 Family history of malignant neoplasm of trachea, bronchus and lung: Secondary | ICD-10-CM | POA: Diagnosis not present

## 2013-09-06 DIAGNOSIS — Z96659 Presence of unspecified artificial knee joint: Secondary | ICD-10-CM | POA: Diagnosis not present

## 2013-09-06 DIAGNOSIS — E876 Hypokalemia: Secondary | ICD-10-CM | POA: Diagnosis present

## 2013-09-06 DIAGNOSIS — Z833 Family history of diabetes mellitus: Secondary | ICD-10-CM

## 2013-09-06 DIAGNOSIS — R197 Diarrhea, unspecified: Secondary | ICD-10-CM

## 2013-09-06 DIAGNOSIS — E039 Hypothyroidism, unspecified: Secondary | ICD-10-CM

## 2013-09-06 DIAGNOSIS — C50411 Malignant neoplasm of upper-outer quadrant of right female breast: Secondary | ICD-10-CM

## 2013-09-06 DIAGNOSIS — E871 Hypo-osmolality and hyponatremia: Secondary | ICD-10-CM

## 2013-09-06 DIAGNOSIS — E440 Moderate protein-calorie malnutrition: Secondary | ICD-10-CM

## 2013-09-06 DIAGNOSIS — E119 Type 2 diabetes mellitus without complications: Secondary | ICD-10-CM | POA: Diagnosis present

## 2013-09-06 DIAGNOSIS — N189 Chronic kidney disease, unspecified: Secondary | ICD-10-CM | POA: Diagnosis present

## 2013-09-06 DIAGNOSIS — N61 Mastitis without abscess: Secondary | ICD-10-CM | POA: Diagnosis present

## 2013-09-06 DIAGNOSIS — N179 Acute kidney failure, unspecified: Secondary | ICD-10-CM | POA: Diagnosis present

## 2013-09-06 DIAGNOSIS — N644 Mastodynia: Secondary | ICD-10-CM | POA: Diagnosis present

## 2013-09-06 DIAGNOSIS — D500 Iron deficiency anemia secondary to blood loss (chronic): Secondary | ICD-10-CM | POA: Diagnosis present

## 2013-09-06 DIAGNOSIS — E669 Obesity, unspecified: Secondary | ICD-10-CM | POA: Diagnosis present

## 2013-09-06 DIAGNOSIS — E785 Hyperlipidemia, unspecified: Secondary | ICD-10-CM | POA: Diagnosis present

## 2013-09-06 LAB — CBC WITH DIFFERENTIAL/PLATELET
Basophils Absolute: 0 10*3/uL (ref 0.0–0.1)
Basophils Relative: 0 % (ref 0–1)
EOS ABS: 0 10*3/uL (ref 0.0–0.7)
Eosinophils Relative: 0 % (ref 0–5)
HCT: 33.3 % — ABNORMAL LOW (ref 36.0–46.0)
HEMOGLOBIN: 10.9 g/dL — AB (ref 12.0–15.0)
Lymphocytes Relative: 5 % — ABNORMAL LOW (ref 12–46)
Lymphs Abs: 0.6 10*3/uL — ABNORMAL LOW (ref 0.7–4.0)
MCH: 26.5 pg (ref 26.0–34.0)
MCHC: 32.7 g/dL (ref 30.0–36.0)
MCV: 80.8 fL (ref 78.0–100.0)
MONOS PCT: 5 % (ref 3–12)
Monocytes Absolute: 0.7 10*3/uL (ref 0.1–1.0)
NEUTROS ABS: 11.2 10*3/uL — AB (ref 1.7–7.7)
NEUTROS PCT: 90 % — AB (ref 43–77)
PLATELETS: 221 10*3/uL (ref 150–400)
RBC: 4.12 MIL/uL (ref 3.87–5.11)
RDW: 15.4 % (ref 11.5–15.5)
WBC: 12.5 10*3/uL — ABNORMAL HIGH (ref 4.0–10.5)

## 2013-09-06 LAB — COMPREHENSIVE METABOLIC PANEL
ALBUMIN: 4.2 g/dL (ref 3.5–5.2)
ALK PHOS: 107 U/L (ref 39–117)
ALT: 12 U/L (ref 0–35)
ANION GAP: 19 — AB (ref 5–15)
AST: 22 U/L (ref 0–37)
BILIRUBIN TOTAL: 0.5 mg/dL (ref 0.3–1.2)
BUN: 24 mg/dL — AB (ref 6–23)
CHLORIDE: 100 meq/L (ref 96–112)
CO2: 21 mEq/L (ref 19–32)
Calcium: 9.6 mg/dL (ref 8.4–10.5)
Creatinine, Ser: 1.18 mg/dL — ABNORMAL HIGH (ref 0.50–1.10)
GFR calc Af Amer: 57 mL/min — ABNORMAL LOW (ref >90)
GFR calc non Af Amer: 49 mL/min — ABNORMAL LOW (ref >90)
Glucose, Bld: 169 mg/dL — ABNORMAL HIGH (ref 70–99)
POTASSIUM: 4 meq/L (ref 3.7–5.3)
Sodium: 140 mEq/L (ref 137–147)
TOTAL PROTEIN: 8.1 g/dL (ref 6.0–8.3)

## 2013-09-06 LAB — URINALYSIS, ROUTINE W REFLEX MICROSCOPIC
BILIRUBIN URINE: NEGATIVE
Glucose, UA: NEGATIVE mg/dL
Hgb urine dipstick: NEGATIVE
Ketones, ur: NEGATIVE mg/dL
Leukocytes, UA: NEGATIVE
NITRITE: NEGATIVE
PH: 5 (ref 5.0–8.0)
Protein, ur: NEGATIVE mg/dL
Specific Gravity, Urine: 1.015 (ref 1.005–1.030)
Urobilinogen, UA: 0.2 mg/dL (ref 0.0–1.0)

## 2013-09-06 LAB — I-STAT CG4 LACTIC ACID, ED: LACTIC ACID, VENOUS: 2.97 mmol/L — AB (ref 0.5–2.2)

## 2013-09-06 MED ORDER — HYDROCHLOROTHIAZIDE 25 MG PO TABS
25.0000 mg | ORAL_TABLET | Freq: Every day | ORAL | Status: DC
  Administered 2013-09-06 – 2013-09-11 (×6): 25 mg via ORAL
  Filled 2013-09-06 (×7): qty 1

## 2013-09-06 MED ORDER — CLONIDINE HCL 0.2 MG/24HR TD PTWK: 0.2000 mg | MEDICATED_PATCH | TRANSDERMAL | Status: DC

## 2013-09-06 MED ORDER — METFORMIN HCL 500 MG PO TABS
500.0000 mg | ORAL_TABLET | Freq: Two times a day (BID) | ORAL | Status: DC
  Administered 2013-09-06 – 2013-09-11 (×10): 500 mg via ORAL
  Filled 2013-09-06 (×13): qty 1

## 2013-09-06 MED ORDER — VANCOMYCIN HCL IN DEXTROSE 1-5 GM/200ML-% IV SOLN
1000.0000 mg | Freq: Once | INTRAVENOUS | Status: AC
  Administered 2013-09-06: 1000 mg via INTRAVENOUS
  Filled 2013-09-06: qty 200

## 2013-09-06 MED ORDER — VANCOMYCIN HCL 10 G IV SOLR
1500.0000 mg | INTRAVENOUS | Status: DC
  Administered 2013-09-07 – 2013-09-08 (×2): 1500 mg via INTRAVENOUS
  Filled 2013-09-06 (×3): qty 1500

## 2013-09-06 MED ORDER — ACETAMINOPHEN 325 MG PO TABS
650.0000 mg | ORAL_TABLET | Freq: Once | ORAL | Status: AC
  Administered 2013-09-06: 650 mg via ORAL
  Filled 2013-09-06: qty 2

## 2013-09-06 MED ORDER — IBUPROFEN 200 MG PO TABS
600.0000 mg | ORAL_TABLET | Freq: Four times a day (QID) | ORAL | Status: DC | PRN
Start: 2013-09-06 — End: 2013-09-08
  Administered 2013-09-06 – 2013-09-07 (×2): 600 mg via ORAL
  Filled 2013-09-06 (×2): qty 3

## 2013-09-06 MED ORDER — REFRESH P.M. OP OINT: 1.0000 [drp] | TOPICAL_OINTMENT | OPHTHALMIC | Status: DC | PRN

## 2013-09-06 MED ORDER — VITAMIN D3 25 MCG (1000 UNIT) PO TABS
1000.0000 [IU] | ORAL_TABLET | Freq: Every day | ORAL | Status: DC
  Administered 2013-09-06 – 2013-09-11 (×6): 1000 [IU] via ORAL
  Filled 2013-09-06 (×6): qty 1

## 2013-09-06 MED ORDER — VANCOMYCIN HCL 10 G IV SOLR
1500.0000 mg | Freq: Once | INTRAVENOUS | Status: AC
  Administered 2013-09-06: 1500 mg via INTRAVENOUS
  Filled 2013-09-06: qty 1500

## 2013-09-06 MED ORDER — LABETALOL HCL 300 MG PO TABS
300.0000 mg | ORAL_TABLET | Freq: Two times a day (BID) | ORAL | Status: DC
  Administered 2013-09-06 – 2013-09-11 (×10): 300 mg via ORAL
  Filled 2013-09-06 (×11): qty 1

## 2013-09-06 MED ORDER — LORAZEPAM 0.5 MG PO TABS
0.5000 mg | ORAL_TABLET | Freq: Three times a day (TID) | ORAL | Status: DC
  Administered 2013-09-06 – 2013-09-11 (×16): 0.5 mg via ORAL
  Filled 2013-09-06 (×16): qty 1

## 2013-09-06 MED ORDER — DEXTROSE 5 % IV SOLN
1.0000 g | Freq: Two times a day (BID) | INTRAVENOUS | Status: DC
  Filled 2013-09-06: qty 1

## 2013-09-06 MED ORDER — AMLODIPINE BESYLATE 10 MG PO TABS
10.0000 mg | ORAL_TABLET | Freq: Every day | ORAL | Status: DC
  Administered 2013-09-06 – 2013-09-11 (×6): 10 mg via ORAL
  Filled 2013-09-06 (×6): qty 1

## 2013-09-06 MED ORDER — ACETAMINOPHEN 500 MG PO TABS
500.0000 mg | ORAL_TABLET | Freq: Four times a day (QID) | ORAL | Status: DC | PRN
  Administered 2013-09-06: 500 mg via ORAL
  Filled 2013-09-06: qty 1

## 2013-09-06 MED ORDER — ALBUTEROL SULFATE (2.5 MG/3ML) 0.083% IN NEBU: 2.5000 mg | INHALATION_SOLUTION | Freq: Four times a day (QID) | RESPIRATORY_TRACT | Status: DC | PRN

## 2013-09-06 MED ORDER — LEVOTHYROXINE SODIUM 125 MCG PO TABS
125.0000 ug | ORAL_TABLET | Freq: Every day | ORAL | Status: DC
  Administered 2013-09-07 – 2013-09-11 (×5): 125 ug via ORAL
  Filled 2013-09-06 (×7): qty 1

## 2013-09-06 MED ORDER — CEFEPIME HCL 1 G IJ SOLR
1.0000 g | Freq: Three times a day (TID) | INTRAMUSCULAR | Status: DC
  Administered 2013-09-06 – 2013-09-11 (×15): 1 g via INTRAVENOUS
  Filled 2013-09-06 (×17): qty 1

## 2013-09-06 MED ORDER — ASPIRIN 81 MG PO CHEW
81.0000 mg | CHEWABLE_TABLET | Freq: Every day | ORAL | Status: DC
  Administered 2013-09-06 – 2013-09-11 (×6): 81 mg via ORAL
  Filled 2013-09-06 (×6): qty 1

## 2013-09-06 MED ORDER — KETOCONAZOLE 2 % EX CREA
1.0000 "application " | TOPICAL_CREAM | Freq: Two times a day (BID) | CUTANEOUS | Status: DC
  Administered 2013-09-06 – 2013-09-11 (×10): 1 via TOPICAL
  Filled 2013-09-06: qty 15

## 2013-09-06 MED ORDER — GABAPENTIN 100 MG PO CAPS
100.0000 mg | ORAL_CAPSULE | Freq: Three times a day (TID) | ORAL | Status: DC
  Administered 2013-09-06 – 2013-09-11 (×15): 100 mg via ORAL
  Filled 2013-09-06 (×18): qty 1

## 2013-09-06 MED ORDER — MORPHINE SULFATE 2 MG/ML IJ SOLN: 2.0000 mg | INTRAMUSCULAR | Status: DC | PRN

## 2013-09-06 MED ORDER — VANCOMYCIN HCL IN DEXTROSE 1-5 GM/200ML-% IV SOLN: 1000.0000 mg | INTRAVENOUS | Status: DC

## 2013-09-06 MED ORDER — SODIUM CHLORIDE 0.9 % IV SOLN
INTRAVENOUS | Status: DC
  Administered 2013-09-06 – 2013-09-07 (×5): via INTRAVENOUS

## 2013-09-06 MED ORDER — ARTIFICIAL TEARS OP OINT: TOPICAL_OINTMENT | OPHTHALMIC | Status: DC | PRN

## 2013-09-06 MED ORDER — HEPARIN SODIUM (PORCINE) 5000 UNIT/ML IJ SOLN
5000.0000 [IU] | Freq: Three times a day (TID) | INTRAMUSCULAR | Status: DC
  Administered 2013-09-06 – 2013-09-11 (×15): 5000 [IU] via SUBCUTANEOUS
  Filled 2013-09-06 (×18): qty 1

## 2013-09-06 MED ORDER — CYCLOSPORINE 0.05 % OP EMUL
1.0000 [drp] | Freq: Two times a day (BID) | OPHTHALMIC | Status: DC
  Administered 2013-09-06 – 2013-09-11 (×10): 1 [drp] via OPHTHALMIC
  Filled 2013-09-06 (×11): qty 1

## 2013-09-06 MED ORDER — HYDROCODONE-ACETAMINOPHEN 5-325 MG PO TABS
1.0000 | ORAL_TABLET | ORAL | Status: DC | PRN
  Administered 2013-09-08: 1 via ORAL
  Filled 2013-09-06: qty 1

## 2013-09-06 NOTE — ED Notes (Addendum)
Febrile. Pt currently has right sided breast cancer, received chemo 3 weeks ago. Reports right breast pain 8/10 and redness to right breast starting this morning. Fever and weakness starting this morning, pt normally ambulates independently, but needs assistance now. Cough started last night. Denies SOB, denies n/v/d. Took tylenol at 0545.  Right sided restricted extremity

## 2013-09-06 NOTE — H&P (Signed)
[facility] History and Physical  [name] FYB:[id number] DOB: [birth year] DOA: 09/06/2013  Referring physician: ED PCP: Rubbie Battiest, MD  Specialists: Elita Boone  Chief Complaint: Breast pain  HPI:  58 y/o ?, known h/o stage IIA invasive ductal carcinoma [see oncology summary on Oncology OV 07/28/13 note], seeing Radiation Oncology as well [not yet on XRT] mild intermeittent, ckd stg II, DM ty II, Htn, Hld, Hyperthyroid, Hypocalcemia.  She started having pain in her left breast as well as fever and chills around 12:30 on the day of admission 8/is a 15 she fell that she needed to go see her doctor and then her daughter lives with her notes that her right breast seemed red she's had some mild tenderness around medical around the incision. She's had no dysuria no dyspnea no cough no sputum cold no nausea no vomiting no diarrhea She states that she went to see Dr. Pablo Ledger couple of days ago for consideration of radiation therapy and was given a cream for underneath her breast as she had a possible moisture associated dermatitis under the right breast.  On admission BUN 24/creatinine 1.1 a, lactic acid 2.9, WC 12.5 with predominant neutrophilia, hemoglobin 10.9  Review of Systems: See above discussion  Past Medical History  Diagnosis Date  . Hyperlipidemia   . Obesity   . Asthma     prn neb. and inhaler  . Osteoarthritis 2003    left knee  . Non-insulin dependent type 2 diabetes mellitus   . Graves' disease   . Ophthalmic manifestation of Graves disease   . Hypertension     on multiple meds., has been on med. > 14 yr.  . Dehydration 04/13/2013  . Breast cancer 01/2013    right  . Wears glasses   . GERD (gastroesophageal reflux disease)    Past Surgical History  Procedure Laterality Date  . Total knee arthroplasty Left 2003  . Total thyroidectomy    . Colonoscopy  2007  . Tubal ligation  1985  . Colonoscopy w/ polypectomy  2009  . Portacath placement Right  02/17/2013    Procedure: INSERTION PORT-A-CATH;  Surgeon: Joyice Faster. Cornett, MD;  Location: [facility];  Service: General;  Laterality: Right;   Social History:  History   Social History Narrative   Lives in [place]          Allergies  Allergen Reactions  . Procardia [Nifedipine] Other (See Comments)    MIGRAINES    Family History  Problem Relation Age of Onset  . Heart disease Father   . Lung cancer Father   . Diabetes Mother   . Diabetes Sister   . Hypertension Sister   . Asthma Sister     Prior to Admission medications   Medication Sig Start Date End Date Taking? Authorizing Provider  acetaminophen (TYLENOL) 500 MG tablet Take 1,000 mg by mouth every 4 (four) hours as needed for moderate pain.   Yes Historical Provider, MD  albuterol (PROVENTIL HFA;VENTOLIN HFA) 108 (90 BASE) MCG/ACT inhaler Inhale 2 puffs into the lungs every 4 (four) hours as needed. 02/02/13  Yes Mikey Kirschner, MD  albuterol (PROVENTIL) (2.5 MG/3ML) 0.083% nebulizer solution Take 3 mLs (2.5 mg total) by nebulization every 6 (six) hours as needed for wheezing or shortness of breath. 08/19/13  Yes Kathyrn Drown, MD  amLODipine (NORVASC) 10 MG tablet Take 10 mg by mouth daily.   Yes Historical Provider, MD  Artificial Tear Ointment (ARTIFICIAL TEARS) ointment Place 1  drop into both eyes as needed (for grey eye disease).    Yes Historical Provider, MD  aspirin 81 MG tablet Take 81 mg by mouth daily.   Yes Historical Provider, MD  calcium carbonate (TUMS EX) 750 MG chewable tablet Chew 2 tablets by mouth 4 (four) times daily.   Yes Historical Provider, MD  cholecalciferol (VITAMIN D) 1000 UNITS tablet Take 1,000 Units by mouth daily.   Yes Historical Provider, MD  cloNIDine (CATAPRES - DOSED IN MG/24 HR) 0.2 mg/24hr patch Place 0.2 mg onto the skin once a week. Sunday/Mondays.   Yes Historical Provider, MD  cycloSPORINE (RESTASIS) 0.05 % ophthalmic emulsion Place 1 drop into both eyes 2 (two)  times daily.    Yes Historical Provider, MD  gabapentin (NEURONTIN) 100 MG capsule Take 1 capsule (100 mg total) by mouth 3 (three) times daily. 04/13/13  Yes Deatra Robinson, MD  hydrochlorothiazide (HYDRODIURIL) 25 MG tablet Take 25 mg by mouth daily.   Yes Historical Provider, MD  ketoconazole (NIZORAL) 2 % cream Apply 1 application topically 2 (two) times daily. 05/05/13  Yes Mikey Kirschner, MD  labetalol (NORMODYNE) 300 MG tablet Take 300 mg by mouth 2 (two) times daily.   Yes Historical Provider, MD  levothyroxine (SYNTHROID, LEVOTHROID) 125 MCG tablet Take 1 tablet (125 mcg total) by mouth daily before breakfast. 05/04/13  Yes Estela Leonie Green, MD  LORazepam (ATIVAN) 0.5 MG tablet Take 1 tablet (0.5 mg total) by mouth every 8 (eight) hours. 05/25/13  Yes Minette Headland, NP  metFORMIN (GLUCOPHAGE) 500 MG tablet Take 500 mg by mouth 2 (two) times daily with a meal.   Yes Historical Provider, MD  oxyCODONE-acetaminophen (ROXICET) 5-325 MG per tablet Take 1-2 tablets by mouth every 4 (four) hours as needed. 07/19/13  Yes Thomas A. Cornett, MD  pravastatin (PRAVACHOL) 80 MG tablet Take 40 mg by mouth at bedtime.   Yes Historical Provider, MD  vitamin B-12 (CYANOCOBALAMIN) 500 MCG tablet Take 500 mcg by mouth daily.   Yes Historical Provider, MD   Physical Exam: Filed Vitals:   09/06/13 0905 09/06/13 0930 09/06/13 1025 09/06/13 1104  BP: 135/70 136/72 144/72 145/66  Pulse: 111 92 84 90  Temp: 101.3 F (38.5 C)  99.7 F (37.6 C) 99.7 F (37.6 C)  TempSrc: Oral  Oral   Resp: 16 18 15 16   Weight: 113.853 kg (251 lb)     SpO2: 98% 97% 98% 98%     General:  Alert pleasant oriented no apparent distress  Eyes: EOMI NCAT  ENT: Throat soft supple, moderate dentition  Neck: No JVD no bruits  Cardiovascular: S1-S2 no murmur rub or gallop  Respiratory: Clinically clear  Abdomen: Soft nontender nondistended no  Skin: Erythema which is mild in the right breast with moist  associated dermatitis underneath the breast.  Musculoskeletal: Range of motion intact  Psychiatric: Euthymic pleasant  Neurologic: Intact grossly cranial nerves intact  Labs on Admission:  Basic Metabolic Panel:  Recent Labs Lab 09/06/13 0933  NA 140  K 4.0  CL 100  CO2 21  GLUCOSE 169*  BUN 24*  CREATININE 1.18*  CALCIUM 9.6   Liver Function Tests:  Recent Labs Lab 09/06/13 0933  AST 22  ALT 12  ALKPHOS 107  BILITOT 0.5  PROT 8.1  ALBUMIN 4.2   No results found for this basename: LIPASE, AMYLASE,  in the last 168 hours No results found for this basename: AMMONIA,  in the last 168 hours  CBC:  Recent Labs Lab 09/06/13 0933  WBC 12.5*  NEUTROABS 11.2*  HGB 10.9*  HCT 33.3*  MCV 80.8  PLT 221   Cardiac Enzymes: No results found for this basename: CKTOTAL, CKMB, CKMBINDEX, TROPONINI,  in the last 168 hours  BNP (last 3 results) No results found for this basename: PROBNP,  in the last 8760 hours CBG: No results found for this basename: GLUCAP,  in the last 168 hours  Radiological Exams on Admission: Dg Chest 2 View  09/06/2013   CLINICAL DATA:  Sore throat.  Cough.  EXAM: CHEST  2 VIEW  COMPARISON:  05/02/2013.  FINDINGS: Part port catheter noted in good anatomic position. Surgical clips upper chest. Lungs are clear. Mild cardiomegaly. Pulmonary vascularity is normal. No pleural effusion or pneumothorax. Degenerative changes thoracic spine.  IMPRESSION: 1. No acute cardiopulmonary disease. Chest is stable from prior exam. 2. Power port catheter in good anatomic position. Surgical clips upper chest.   Electronically Signed   By: Marcello Moores  Register   On: 09/06/2013 10:21    EKG: Independently reviewed. None performed  Assessment/Plan Principal Problem:   Mild Sepsis Cellulitis of breast, DDx inflammatory Ca breast-has fever but no hypertension. patient has already received vancomycin. 4 beta-hemolytic strep coverage I will add cefepime.  I will also start on  fluconazole IV for now=.she may continue her cuticles on her breast  If this does not improve, patient may need to be seen by his breast surgeon Dr. Brantley Stage. I will hold off mark out the area right breast today but please contact him tomorrow if this is not better. We will continue Norco one to 2Q4 when necessary and add IV morphine 2 mg every 2 when necessary for pain   Active Problems:   Diabetes mellitus, type 2 with neuropathy-moderate control 10 continue metformin 500 twice a day. If fasting blood sugars are above 200 consider addition of sliding scale coverage. Continue gabapentin 100 3 times a day   Hyperthyroidism with Graves' disease-continue levothyroxine 125 mcg daily-continue artificial tears.  Consider outpatient TSH    Chronic kidney disease stage II-stable currently, monitor labs in a.m.   Hyperlipidemia-hold statin for now    Hypertension continue amlodipine 10 mg daily, HCTZ 25 daily, Labetalol 300 twice a day, clonidine 0.2 milligram transdermal patch every week   Obesity-monitor    Asthma-seems well controlled continue albuterol 2.5 nebs every 6 when necessary   Osteoarthritis   CKD (chronic kidney disease) stage 2, GFR 60-89 ml/min   Time spent:  45 minutes discuss her daughter's bedside Hemodynamically stable therefore admitted to [facility] Pager [phone] If 7PM-7AM, please contact night-coverage [email or web address] Password [email or web address] 09/06/2013, 11:43 AM

## 2013-09-06 NOTE — Progress Notes (Signed)
ANTIBIOTIC CONSULT NOTE - INITIAL  Pharmacy Consult for Vancomycin, Cefepime Indication: sepsis  Allergies  Allergen Reactions  . Procardia [Nifedipine] Other (See Comments)    MIGRAINES    Patient Measurements: Weight: 251 lb (113.853 kg)  Vital Signs: Temp: 99.7 F (37.6 C) (08/25 1104) Temp src: Oral (08/25 1025) BP: 145/66 mmHg (08/25 1104) Pulse Rate: 90 (08/25 1104) Intake/Output from previous day:   Intake/Output from this shift:    Labs:  Recent Labs  09/06/13 0933  WBC 12.5*  HGB 10.9*  PLT 221  CREATININE 1.18*   The CrCl is unknown because both a height and weight (above a minimum accepted value) are required for this calculation. No results found for this basename: VANCOTROUGH, VANCOPEAK, VANCORANDOM, GENTTROUGH, GENTPEAK, GENTRANDOM, TOBRATROUGH, TOBRAPEAK, TOBRARND, AMIKACINPEAK, AMIKACINTROU, AMIKACIN,  in the last 72 hours   Microbiology: No results found for this or any previous visit (from the past 720 hour(s)).  Medical History: Past Medical History  Diagnosis Date  . Hyperlipidemia   . Obesity   . Asthma     prn neb. and inhaler  . Osteoarthritis 2003    left knee  . Non-insulin dependent type 2 diabetes mellitus   . Graves' disease   . Ophthalmic manifestation of Graves disease   . Hypertension     on multiple meds., has been on med. > 14 yr.  . Dehydration 04/13/2013  . Breast cancer 01/2013    right  . Wears glasses   . GERD (gastroesophageal reflux disease)     Assessment: 57 yoF known h/o stage IIA invasive ductal carcinoma presents with breast pain with fever and chills.  Patient admitted with mild sepsis secondary to cellulitis of breast.  Dosing empiric vancomycin and cefepime.  Patient has already received Vancomycin 1g x 1 in ED.  Tmax: 99.7 WBC: mildly elevated Renal: SCr 1.18, CrCl~ 58 ml/min (normalized), ~92 ml/min (CG) Blood and urine cultures collected.  Goal of Therapy:  Vancomycin trough level 15-20  mcg/ml Eradication of infection Doses adjusted per renal function  Plan:  1.  Vancomycin 1500mg  IV x 1 (in addition to 1g dose given in ED for total of 2500 mg load) then 1500 mg IV q24h per obese dosing nomogram. 2.  Cefepime 1g IV q8h. 3.  F/u SCr, culture results, clinical course.  Hershal Coria 09/06/2013,1:37 PM

## 2013-09-06 NOTE — ED Provider Notes (Signed)
CSN: [id number]     Arrival date & time 09/06/13  9381 History   First MD Initiated Contact with Patient 09/06/13 [phone]     Chief Complaint  Patient presents with  . Fever     (Consider location/radiation/quality/duration/timing/severity/associated sxs/prior Treatment) HPI Comments: Patient here complaining of one day history of myalgias along with cough. Patient is receiving chemotherapy for breast cancer and last treatment was 3 weeks ago. She also notes swelling and increased redness to her right breast. This is the breasts were she has cancer. Denies any vomiting or diarrhea. No chest or abdominal pain. No urinary symptoms reported. No medications used prior to arrival. Nothing makes her symptoms better or worse.  Patient is a 58 y.o. female presenting with fever. The history is provided by the patient.  Fever   Past Medical History  Diagnosis Date  . Hyperlipidemia   . Obesity   . Asthma     prn neb. and inhaler  . Osteoarthritis 2003    left knee  . Non-insulin dependent type 2 diabetes mellitus   . Graves' disease   . Ophthalmic manifestation of Graves disease   . Hypertension     on multiple meds., has been on med. > 14 yr.  . Dehydration 04/13/2013  . Breast cancer 01/2013    right  . Wears glasses   . GERD (gastroesophageal reflux disease)    Past Surgical History  Procedure Laterality Date  . Total knee arthroplasty Left 2003  . Total thyroidectomy    . Colonoscopy  2007  . Tubal ligation  1985  . Colonoscopy w/ polypectomy  2009  . Portacath placement Right 02/17/2013    Procedure: INSERTION PORT-A-CATH;  Surgeon: Joyice Faster. Cornett, MD;  Location: [facility];  Service: General;  Laterality: Right;   Family History  Problem Relation Age of Onset  . Heart disease Father   . Lung cancer Father   . Diabetes Mother   . Diabetes Sister   . Hypertension Sister   . Asthma Sister    History  Substance Use Topics  . Smoking status: Former Smoker --  0.25 packs/day for 20 years    Quit date: 02/10/1991  . Smokeless tobacco: Never Used  . Alcohol Use: No   OB History   Grav Para Term Preterm Abortions TAB SAB Ect Mult Living                 Review of Systems  Constitutional: Positive for fever.  All other systems reviewed and are negative.     Allergies  Procardia  Home Medications   Prior to Admission medications   Medication Sig Start Date End Date Taking? Authorizing Provider  albuterol (PROVENTIL HFA;VENTOLIN HFA) 108 (90 BASE) MCG/ACT inhaler Inhale 2 puffs into the lungs every 4 (four) hours as needed. 02/02/13   Mikey Kirschner, MD  albuterol (PROVENTIL) (2.5 MG/3ML) 0.083% nebulizer solution Take 3 mLs (2.5 mg total) by nebulization every 6 (six) hours as needed for wheezing or shortness of breath. 08/19/13   Kathyrn Drown, MD  albuterol (PROVENTIL) (5 MG/ML) 0.5% nebulizer solution Take 2.5 mg by nebulization every 6 (six) hours as needed for wheezing or shortness of breath.    Historical Provider, MD  amLODipine (NORVASC) 10 MG tablet TAKE ONE TABLET BY MOUTH ONCE DAILY.    Mikey Kirschner, MD  Artificial Tear Ointment (ARTIFICIAL TEARS) ointment Place 1 drop into both eyes as needed (for grey eye disease).  Historical Provider, MD  aspirin 81 MG tablet Take 81 mg by mouth daily.    Historical Provider, MD  calcium citrate (CALCITRATE - DOSED IN MG ELEMENTAL CALCIUM) 950 MG tablet Take 1 tablet (200 mg of elemental calcium total) by mouth 2 (two) times daily after a meal. 05/11/13   Orson Eva, MD  cloNIDine (CATAPRES - DOSED IN MG/24 HR) 0.2 mg/24hr patch Place 0.2 mg onto the skin once a week. Mondays.    Historical Provider, MD  cycloSPORINE (RESTASIS) 0.05 % ophthalmic emulsion Place 1 drop into both eyes 2 (two) times daily.     Historical Provider, MD  gabapentin (NEURONTIN) 100 MG capsule Take 1 capsule (100 mg total) by mouth 3 (three) times daily. 04/13/13   Deatra Robinson, MD  hydrochlorothiazide (HYDRODIURIL)  25 MG tablet TAKE ONE TABLET BY MOUTH ONCE DAILY. 06/13/13   Mikey Kirschner, MD  ketoconazole (NIZORAL) 2 % cream Apply 1 application topically 2 (two) times daily. 05/05/13   Mikey Kirschner, MD  labetalol (NORMODYNE) 300 MG tablet TAKE (1) TABLET BY MOUTH TWICE DAILY. 06/13/13   Mikey Kirschner, MD  levothyroxine (SYNTHROID, LEVOTHROID) 125 MCG tablet Take 1 tablet (125 mcg total) by mouth daily before breakfast. 05/04/13   Erline Hau, MD  LORazepam (ATIVAN) 0.5 MG tablet Take 1 tablet (0.5 mg total) by mouth every 8 (eight) hours. 05/25/13   Minette Headland, NP  metFORMIN (GLUCOPHAGE) 500 MG tablet TAKE (1) TABLET BY MOUTH TWICE A DAY WITH MEALS (BREAKFAST AND SUPPER). 04/22/13   Mikey Kirschner, MD  omeprazole (PRILOSEC) 20 MG capsule Take 1 capsule (20 mg total) by mouth daily. 03/04/13   Wilmon Arms, MD  ondansetron (ZOFRAN ODT) 4 MG disintegrating tablet Take 1 tablet (4 mg total) by mouth every 8 (eight) hours as needed for nausea. 03/19/13   Johnna Acosta, MD  oxyCODONE-acetaminophen (ROXICET) 5-325 MG per tablet Take 1-2 tablets by mouth every 4 (four) hours as needed. 07/19/13   Thomas A. Cornett, MD  potassium chloride SA (K-DUR,KLOR-CON) 20 MEQ tablet Take 1 tablet (20 mEq total) by mouth 2 (two) times daily. 05/20/13   Minette Headland, NP  pravastatin (PRAVACHOL) 40 MG tablet Take 40 mg by mouth 2 (two) times daily at 8 am and 10 pm.    Historical Provider, MD  pravastatin (PRAVACHOL) 80 MG tablet TAKE (1) TABLET BY MOUTH AT BEDTIME. 09/01/13   Mikey Kirschner, MD  prochlorperazine (COMPAZINE) 10 MG tablet Take 10 mg by mouth daily as needed for nausea or vomiting.  02/09/13   Historical Provider, MD  vitamin B-12 (CYANOCOBALAMIN) 500 MCG tablet Take 500 mcg by mouth daily.    Historical Provider, MD  Vitamin D, Ergocalciferol, (DRISDOL) 50000 UNITS CAPS capsule Take 50,000 Units by mouth every 7 (seven) days.    Historical Provider, MD   BP 136/72  Pulse 92  Temp(Src)  101.3 F (38.5 C) (Oral)  Resp 18  Wt 251 lb (113.853 kg)  SpO2 97% Physical Exam  Nursing note and vitals reviewed. Constitutional: She is oriented to person, place, and time. She appears well-developed and well-nourished.  Non-toxic appearance. No distress.  HENT:  Head: Normocephalic and atraumatic.  Eyes: Conjunctivae, EOM and lids are normal. Pupils are equal, round, and reactive to light.  Neck: Normal range of motion. Neck supple. No tracheal deviation present. No mass present.  Cardiovascular: Regular rhythm and normal heart sounds.  Tachycardia present.  Exam reveals no gallop.  No murmur heard. Pulmonary/Chest: Effort normal and breath sounds normal. No stridor. No respiratory distress. She has no decreased breath sounds. She has no wheezes. She has no rhonchi. She has no rales.    Abdominal: Soft. Normal appearance and bowel sounds are normal. She exhibits no distension. There is no tenderness. There is no rebound and no CVA tenderness.  Musculoskeletal: Normal range of motion. She exhibits no edema and no tenderness.  Neurological: She is alert and oriented to person, place, and time. She has normal strength. No cranial nerve deficit or sensory deficit. GCS eye subscore is 4. GCS verbal subscore is 5. GCS motor subscore is 6.  Skin: Skin is warm and dry. No abrasion and no rash noted.  Psychiatric: She has a normal mood and affect. Her speech is normal and behavior is normal.    ED Course  Procedures (including critical care time) Labs Review Labs Reviewed  I-STAT CG4 LACTIC ACID, ED - Abnormal; Notable for the following:    Lactic Acid, Venous 2.97 (*)    All other components within normal limits  URINE CULTURE  CULTURE, BLOOD (ROUTINE X 2)  CULTURE, BLOOD (ROUTINE X 2)  CBC WITH DIFFERENTIAL  COMPREHENSIVE METABOLIC PANEL  URINALYSIS, ROUTINE W REFLEX MICROSCOPIC    Imaging Review No results found.   EKG Interpretation None      MDM   Final diagnoses:    None    Patient started on IV vancomycin and will be admitted to the hospitalist service    Leota Jacobsen, MD 09/06/13 1052

## 2013-09-06 NOTE — Progress Notes (Signed)
UR completed 

## 2013-09-07 ENCOUNTER — Other Ambulatory Visit: Payer: Medicare Other

## 2013-09-07 ENCOUNTER — Ambulatory Visit: Payer: Medicare Other

## 2013-09-07 ENCOUNTER — Telehealth: Payer: Self-pay | Admitting: Oncology

## 2013-09-07 LAB — COMPREHENSIVE METABOLIC PANEL
ALT: 12 U/L (ref 0–35)
AST: 21 U/L (ref 0–37)
Albumin: 3.3 g/dL — ABNORMAL LOW (ref 3.5–5.2)
Alkaline Phosphatase: 83 U/L (ref 39–117)
Anion gap: 17 — ABNORMAL HIGH (ref 5–15)
BUN: 20 mg/dL (ref 6–23)
CALCIUM: 8.7 mg/dL (ref 8.4–10.5)
CO2: 20 meq/L (ref 19–32)
CREATININE: 1.18 mg/dL — AB (ref 0.50–1.10)
Chloride: 103 mEq/L (ref 96–112)
GFR calc non Af Amer: 49 mL/min — ABNORMAL LOW (ref >90)
GFR, EST AFRICAN AMERICAN: 57 mL/min — AB (ref >90)
GLUCOSE: 131 mg/dL — AB (ref 70–99)
Potassium: 3.5 mEq/L — ABNORMAL LOW (ref 3.7–5.3)
Sodium: 140 mEq/L (ref 137–147)
TOTAL PROTEIN: 6.6 g/dL (ref 6.0–8.3)
Total Bilirubin: 0.5 mg/dL (ref 0.3–1.2)

## 2013-09-07 LAB — GLUCOSE, CAPILLARY: Glucose-Capillary: 117 mg/dL — ABNORMAL HIGH (ref 70–99)

## 2013-09-07 LAB — URINE CULTURE
CULTURE: NO GROWTH
Colony Count: NO GROWTH

## 2013-09-07 LAB — CBC
HCT: 27.7 % — ABNORMAL LOW (ref 36.0–46.0)
HEMOGLOBIN: 9.5 g/dL — AB (ref 12.0–15.0)
MCH: 28 pg (ref 26.0–34.0)
MCHC: 34.3 g/dL (ref 30.0–36.0)
MCV: 81.7 fL (ref 78.0–100.0)
Platelets: 165 10*3/uL (ref 150–400)
RBC: 3.39 MIL/uL — AB (ref 3.87–5.11)
RDW: 15.7 % — ABNORMAL HIGH (ref 11.5–15.5)
WBC: 11.3 10*3/uL — ABNORMAL HIGH (ref 4.0–10.5)

## 2013-09-07 LAB — PROTIME-INR
INR: 1.29 (ref 0.00–1.49)
Prothrombin Time: 16.1 seconds — ABNORMAL HIGH (ref 11.6–15.2)

## 2013-09-07 MED ORDER — POTASSIUM CHLORIDE CRYS ER 20 MEQ PO TBCR
40.0000 meq | EXTENDED_RELEASE_TABLET | Freq: Once | ORAL | Status: AC
  Administered 2013-09-07: 40 meq via ORAL
  Filled 2013-09-07: qty 2

## 2013-09-07 NOTE — Telephone Encounter (Signed)
pt dauggter cld to state mother in hospital wanted to cancel trmt for today-contacted chemp room to adv to CX trmt for today-Daughter wanted to speak to the nurse

## 2013-09-07 NOTE — Progress Notes (Signed)
Patient ID: [name], female   DOB: 12-Oct-1955, 58 y.o.   MRN: [id number]  [facility] PROGRESS NOTE  [name] CZY:[id number] DOB: 1955-12-31 DOA: 09/06/2013 PCP: Rubbie Battiest, MD  Brief narrative: 58 y/o ?, known h/o stage IIA invasive ductal carcinoma, presented with right breast swelling, erythema, pain. Admitted for treatment of presumptive cellulitis.   Assessment and Plan:    Principal Problem:   Cellulitis of breast, DDx inflammatroy Ca breast - based on outlined bordered, it is improving - continue Vanc and Maxipime day #2 - provide analgesia as needed  Active Problems:   Diabetes mellitus, type 2 - reasonable inpatient control - continue as per home medical regimen    Hyperthyroidism - continue synthroid    Hyperlipidemia - continue statin    Hypokalemia - supplement and repeat BMP in AM   Leukocytosis - from cellulitis - ABX as noted above - WBC is trending down    CKD (chronic kidney disease) stage 2, GFR 60-89 ml/min - Cr remains stable - BMP in AM   Acute on chronic blood loss anemia - slight drop likely dilutional - no signs of active bleeding - CBC in AM  DVT prophylaxis  Heparin SQ while pt is in hospital  Code Status: Full Family Communication: Pt at bedside Disposition Plan: Home when medically stable   IV Access:   Peripheral IV Procedures and diagnostic studies:   Dg Chest 2 View   09/06/2013   No acute cardiopulmonary disease. Chest is stable from prior exam. Power port catheter in good anatomic position. Surgical clips upper chest.    Medical Consultants:   Surgery  Other Consultants:   Physical therapy  Anti-Infectives:   Vancomycin 8/25 --> Zosyn 8/25 -->  Faye Ramsay, MD  [facility] Pager [phone]  If 7PM-7AM, please contact night-coverage [email or web address] Password [facility] 09/07/2013, 9:47 AM   LOS: 1 day   HPI/Subjective: No events overnight.   Objective: Filed Vitals:   09/06/13 1621 09/06/13 1911  09/06/13 2137 09/07/13 0423  BP:   108/56 135/63  Pulse:   85 84  Temp: 101.2 F (38.4 C) 99.9 F (37.7 C) 98.9 F (37.2 C) 98.6 F (37 C)  TempSrc:   Oral Oral  Resp:   16 16  Height:      Weight:      SpO2:   98% 97%    Intake/Output Summary (Last 24 hours) at 09/07/13 0947 Last data filed at 09/07/13 0535  Gross per 24 hour  Intake    480 ml  Output      0 ml  Net    480 ml    Exam:   General:  Pt is alert, follows commands appropriately, not in acute distress  Cardiovascular: Regular rate and rhythm, S1/S2, no murmurs, no rubs, no gallops  Respiratory: Clear to auscultation bilaterally, no wheezing, no crackles, no rhonchi  Abdomen: Soft, non tender, non distended, bowel sounds present, no guarding  Chest: Right breast with erythema and TTP, appears to be improving   Data Reviewed: Basic Metabolic Panel:  Recent Labs Lab 09/06/13 0933 09/07/13 0405  NA 140 140  K 4.0 3.5*  CL 100 103  CO2 21 20  GLUCOSE 169* 131*  BUN 24* 20  CREATININE 1.18* 1.18*  CALCIUM 9.6 8.7   Liver Function Tests:  Recent Labs Lab 09/06/13 0933 09/07/13 0405  AST 22 21  ALT 12 12  ALKPHOS 107 83  BILITOT 0.5 0.5  PROT 8.1 6.6  ALBUMIN 4.2  3.3*   CBC:  Recent Labs Lab 09/06/13 0933 09/07/13 0405  WBC 12.5* 11.3*  NEUTROABS 11.2*  --   HGB 10.9* 9.5*  HCT 33.3* 27.7*  MCV 80.8 81.7  PLT 221 165   CBG:  Recent Labs Lab 09/07/13 0809  GLUCAP 117*    Recent Results (from the past 240 hour(s))  CULTURE, BLOOD (ROUTINE X 2)     Status: None   Collection Time    09/06/13  9:25 AM      Result Value Ref Range Status   Specimen Description BLOOD LEFT ARM   Final   Special Requests BOTTLES DRAWN AEROBIC AND ANAEROBIC 5ML   Final   Culture  Setup Time     Final   Value: 09/06/2013 12:44     Performed at [facility]   Culture     Final   Value:        BLOOD CULTURE RECEIVED NO GROWTH TO DATE CULTURE WILL BE HELD FOR 5 DAYS BEFORE ISSUING A FINAL  NEGATIVE REPORT     Performed at [facility]   Report Status PENDING   Incomplete  CULTURE, BLOOD (ROUTINE X 2)     Status: None   Collection Time    09/06/13  9:55 AM      Result Value Ref Range Status   Specimen Description BLOOD LEFT FOREARM   Final   Special Requests BOTTLES DRAWN AEROBIC AND ANAEROBIC 5ML   Final   Culture  Setup Time     Final   Value: 09/06/2013 12:44     Performed at [facility]   Culture     Final   Value:        BLOOD CULTURE RECEIVED NO GROWTH TO DATE CULTURE WILL BE HELD FOR 5 DAYS BEFORE ISSUING A FINAL NEGATIVE REPORT     Performed at [facility]   Report Status PENDING   Incomplete     Scheduled Meds: . amLODipine  10 mg Oral Daily  . aspirin  81 mg Oral Daily  . ceFEPime (MAXIPIME) IV  1 g Intravenous 3 times per day  . cholecalciferol  1,000 Units Oral Daily  . [START ON 09/13/2013] cloNIDine  0.2 mg Transdermal Weekly  . cycloSPORINE  1 drop Both Eyes BID  . gabapentin  100 mg Oral TID  . heparin  5,000 Units Subcutaneous 3 times per day  . hydrochlorothiazide  25 mg Oral Daily  . ketoconazole  1 application Topical BID  . labetalol  300 mg Oral BID  . levothyroxine  125 mcg Oral QAC breakfast  . LORazepam  0.5 mg Oral 3 times per day  . metFORMIN  500 mg Oral BID WC  . vancomycin  1,500 mg Intravenous Q24H   Continuous Infusions: . sodium chloride 125 mL/hr at 09/07/13 [phone]

## 2013-09-07 NOTE — Progress Notes (Signed)
Subjective: Pt admitted to medical service 7 weeks after right breast lumpectomy for breast cancer.  She says she had the sudden onset of right breast pain,  Swelling and redness yesterday.  Had a minor yeast infection that went away after nystatin powder give a couple of weeks ago.  She says she feels better today and her breast is less sore.  Had fever overnight.    Objective: Vital signs in last 24 hours: Temp:  [98.6 F (37 C)-101.3 F (38.5 C)] 98.6 F (37 C) (08/26 0423) Pulse Rate:  [84-111] 84 (08/26 0423) Resp:  [15-20] 16 (08/26 0423) BP: (108-152)/(56-72) 135/63 mmHg (08/26 0423) SpO2:  [97 %-100 %] 97 % (08/26 0423) Weight:  [251 lb (113.853 kg)] 251 lb (113.853 kg) (08/25 1315) Last BM Date: 09/06/13  Intake/Output from previous day: 08/25 0701 - 08/26 0700 In: 480 [P.O.:480] Out: -  Intake/Output this shift:    Incision/Wound:right breast with erythema but no significant fluid collections under incision.  Less erythema according to ink from marker. Not very tender and no drainage from incision.   Lab Results:   Recent Labs  09/06/13 0933 09/07/13 0405  WBC 12.5* 11.3*  HGB 10.9* 9.5*  HCT 33.3* 27.7*  PLT 221 165   BMET  Recent Labs  09/06/13 0933 09/07/13 0405  NA 140 140  K 4.0 3.5*  CL 100 103  CO2 21 20  GLUCOSE 169* 131*  BUN 24* 20  CREATININE 1.18* 1.18*  CALCIUM 9.6 8.7   PT/INR  Recent Labs  09/07/13 0405  LABPROT 16.1*  INR 1.29   ABG No results found for this basename: PHART, PCO2, PO2, HCO3,  in the last 72 hours  Studies/Results: Dg Chest 2 View  09/06/2013   CLINICAL DATA:  Sore throat.  Cough.  EXAM: CHEST  2 VIEW  COMPARISON:  05/02/2013.  FINDINGS: Part port catheter noted in good anatomic position. Surgical clips upper chest. Lungs are clear. Mild cardiomegaly. Pulmonary vascularity is normal. No pleural effusion or pneumothorax. Degenerative changes thoracic spine.  IMPRESSION: 1. No acute cardiopulmonary disease.  Chest is stable from prior exam. 2. Power port catheter in good anatomic position. Surgical clips upper chest.   Electronically Signed   By: Marcello Moores  Register   On: 09/06/2013 10:21    Anti-infectives: Anti-infectives   Start     Dose/Rate Route Frequency Ordered Stop   09/07/13 1400  vancomycin (VANCOCIN) 1,500 mg in sodium chloride 0.9 % 500 mL IVPB     1,500 mg 250 mL/hr over 120 Minutes Intravenous Every 24 hours 09/06/13 1346     09/06/13 1400  ceFEPIme (MAXIPIME) 1 g in dextrose 5 % 50 mL IVPB  Status:  Discontinued     1 g 100 mL/hr over 30 Minutes Intravenous Every 12 hours 09/06/13 1318 09/06/13 1347   09/06/13 1400  vancomycin (VANCOCIN) 1,500 mg in sodium chloride 0.9 % 500 mL IVPB     1,500 mg 250 mL/hr over 120 Minutes Intravenous  Once 09/06/13 1346 09/06/13 1912   09/06/13 1400  ceFEPIme (MAXIPIME) 1 g in dextrose 5 % 50 mL IVPB     1 g 100 mL/hr over 30 Minutes Intravenous 3 times per day 09/06/13 1347     09/06/13 1330  vancomycin (VANCOCIN) IVPB 1000 mg/200 mL premix  Status:  Discontinued     1,000 mg 200 mL/hr over 60 Minutes Intravenous Every 24 hours 09/06/13 1318 09/06/13 1346   09/06/13 1015  vancomycin (VANCOCIN) IVPB 1000 mg/200 mL  premix     1,000 mg 200 mL/hr over 60 Minutes Intravenous  Once 09/06/13 1004 09/06/13 1155      Assessment/Plan: Hx right breast cancer s/p right breast lumpectomy 7 weeks ago with sudden onset right breast pain,  Redness and swelling admitted for right breast cellulitis yesterday Pt states she feels better today.  Had fever last night.  Says she has minimal pain in breast at incision.  No abscess or significant fluid collection on exam.  Seems better today on abx compared to yesterday according to the patient.  Continue IV abx for a couple moore days and transition to PO if she continues to improve.  If not she will need an U/S of her right breast and possible aspiration if significant fluid collection present.  Will follow along.   LOS: 1 day    Nickie Warwick A. 09/07/2013

## 2013-09-08 ENCOUNTER — Ambulatory Visit: Payer: Medicare Other

## 2013-09-08 LAB — GLUCOSE, CAPILLARY: GLUCOSE-CAPILLARY: 144 mg/dL — AB (ref 70–99)

## 2013-09-08 LAB — BASIC METABOLIC PANEL
ANION GAP: 13 (ref 5–15)
BUN: 16 mg/dL (ref 6–23)
CO2: 22 mEq/L (ref 19–32)
Calcium: 8.6 mg/dL (ref 8.4–10.5)
Chloride: 103 mEq/L (ref 96–112)
Creatinine, Ser: 1.11 mg/dL — ABNORMAL HIGH (ref 0.50–1.10)
GFR, EST AFRICAN AMERICAN: 62 mL/min — AB (ref >90)
GFR, EST NON AFRICAN AMERICAN: 53 mL/min — AB (ref >90)
GLUCOSE: 109 mg/dL — AB (ref 70–99)
POTASSIUM: 3.7 meq/L (ref 3.7–5.3)
SODIUM: 138 meq/L (ref 137–147)

## 2013-09-08 LAB — CBC
HCT: 27.6 % — ABNORMAL LOW (ref 36.0–46.0)
Hemoglobin: 9.1 g/dL — ABNORMAL LOW (ref 12.0–15.0)
MCH: 27.1 pg (ref 26.0–34.0)
MCHC: 33 g/dL (ref 30.0–36.0)
MCV: 82.1 fL (ref 78.0–100.0)
PLATELETS: 167 10*3/uL (ref 150–400)
RBC: 3.36 MIL/uL — ABNORMAL LOW (ref 3.87–5.11)
RDW: 15.7 % — AB (ref 11.5–15.5)
WBC: 7.4 10*3/uL (ref 4.0–10.5)

## 2013-09-08 MED ORDER — IBUPROFEN 200 MG PO TABS
600.0000 mg | ORAL_TABLET | Freq: Four times a day (QID) | ORAL | Status: DC | PRN
  Administered 2013-09-08: 600 mg via ORAL
  Filled 2013-09-08: qty 3

## 2013-09-08 MED ORDER — OXYCODONE-ACETAMINOPHEN 5-325 MG PO TABS
1.0000 | ORAL_TABLET | ORAL | Status: DC | PRN
  Administered 2013-09-09 (×2): 1 via ORAL
  Administered 2013-09-10 – 2013-09-11 (×3): 2 via ORAL
  Filled 2013-09-08: qty 1
  Filled 2013-09-08: qty 2
  Filled 2013-09-08 (×2): qty 1
  Filled 2013-09-08 (×2): qty 2

## 2013-09-08 MED ORDER — SODIUM CHLORIDE 0.9 % IV SOLN: INTRAVENOUS | Status: AC

## 2013-09-08 NOTE — Progress Notes (Signed)
  Subjective: Pt better today. No fever and WBC.   LESS  PAIN  Objective: Vital signs in last 24 hours: Temp:  [97.6 F (36.4 C)-99.9 F (37.7 C)] 97.6 F (36.4 C) (08/27 0458) Pulse Rate:  [74-95] 74 (08/27 0458) Resp:  [16] 16 (08/27 0458) BP: (122-137)/(53-70) 124/53 mmHg (08/27 0458) SpO2:  [98 %-100 %] 99 % (08/27 0458) Last BM Date: 09/06/13  Intake/Output from previous day: 08/26 0701 - 08/27 0700 In: 1040 [P.O.:1040] Out: -  Intake/Output this shift:    Incision/Wound: Right breast erythema much less today;     sore but minimal much less swelling. No abscess.  Lab Results:   Recent Labs  09/07/13 0405 09/08/13 0001  WBC 11.3* 7.4  HGB 9.5* 9.1*  HCT 27.7* 27.6*  PLT 165 167   BMET  Recent Labs  09/07/13 0405 09/08/13 0001  NA 140 138  K 3.5* 3.7  CL 103 103  CO2 20 22  GLUCOSE 131* 109*  BUN 20 16  CREATININE 1.18* 1.11*  CALCIUM 8.7 8.6   PT/INR  Recent Labs  09/07/13 0405  LABPROT 16.1*  INR 1.29   ABG No results found for this basename: PHART, PCO2, PO2, HCO3,  in the last 72 hours  Studies/Results: Dg Chest 2 View  09/06/2013   CLINICAL DATA:  Sore throat.  Cough.  EXAM: CHEST  2 VIEW  COMPARISON:  05/02/2013.  FINDINGS: Part port catheter noted in good anatomic position. Surgical clips upper chest. Lungs are clear. Mild cardiomegaly. Pulmonary vascularity is normal. No pleural effusion or pneumothorax. Degenerative changes thoracic spine.  IMPRESSION: 1. No acute cardiopulmonary disease. Chest is stable from prior exam. 2. Power port catheter in good anatomic position. Surgical clips upper chest.   Electronically Signed   By: Marcello Moores  Register   On: 09/06/2013 10:21    Anti-infectives: Anti-infectives   Start     Dose/Rate Route Frequency Ordered Stop   09/07/13 1400  vancomycin (VANCOCIN) 1,500 mg in sodium chloride 0.9 % 500 mL IVPB     1,500 mg 250 mL/hr over 120 Minutes Intravenous Every 24 hours 09/06/13 1346     09/06/13 1400   ceFEPIme (MAXIPIME) 1 g in dextrose 5 % 50 mL IVPB  Status:  Discontinued     1 g 100 mL/hr over 30 Minutes Intravenous Every 12 hours 09/06/13 1318 09/06/13 1347   09/06/13 1400  vancomycin (VANCOCIN) 1,500 mg in sodium chloride 0.9 % 500 mL IVPB     1,500 mg 250 mL/hr over 120 Minutes Intravenous  Once 09/06/13 1346 09/06/13 1912   09/06/13 1400  ceFEPIme (MAXIPIME) 1 g in dextrose 5 % 50 mL IVPB     1 g 100 mL/hr over 30 Minutes Intravenous 3 times per day 09/06/13 1347     09/06/13 1330  vancomycin (VANCOCIN) IVPB 1000 mg/200 mL premix  Status:  Discontinued     1,000 mg 200 mL/hr over 60 Minutes Intravenous Every 24 hours 09/06/13 1318 09/06/13 1346   09/06/13 1015  vancomycin (VANCOCIN) IVPB 1000 mg/200 mL premix     1,000 mg 200 mL/hr over 60 Minutes Intravenous  Once 09/06/13 1004 09/06/13 1155      Assessment/Plan: Cellulitis right breast Improved with normal WBC and fever curve  Can go home when medically able on PO ABX  Can follow up with me in 10 - 14 days.   LOS: 2 days    Jeanell Mangan A. 09/08/2013

## 2013-09-08 NOTE — Progress Notes (Signed)
PT Cancellation Note  Patient Details Name: [name] MRN: [id number] DOB: 1955-07-08   Cancelled Treatment:    Reason Eval/Treat Not Completed: Fatigue/lethargy limiting ability to participate (pt states she has been walking to the bathroom and is tired right now. check  back 09/09/13)   Claretha Cooper 09/08/2013, 4:13 PM Tresa Endo PT [phone]

## 2013-09-08 NOTE — Progress Notes (Signed)
Patient ID: [name], female   DOB: 06/28/1955, 58 y.o.   MRN: [id number] [facility] PROGRESS NOTE  GARYN WAGUESPACK JYN:[id number] DOB: [birth year] DOA: 09/06/2013 PCP: Rubbie Battiest, MD  Brief narrative:  58 y/o ?, known h/o stage IIA invasive ductal carcinoma, presented with right breast swelling, erythema, pain. Admitted for treatment of presumptive cellulitis.   Assessment and Plan:   Principal Problem:  Cellulitis of breast, DDx inflammatroy Ca breast  - based on outlined bordered, it is improving  - continue Vanc and Maxipime day #3 - provide analgesia as needed  - possible transition to oral ABX in 24 hours if cellulitis continue improving   Active Problems:  Diabetes mellitus, type 2  - reasonable inpatient control  - continue as per home medical regimen  Hyperthyroidism  - continue synthroid  Hyperlipidemia  - continue statin  Hypokalemia  - supplemented and WNL this AM Leukocytosis  - from cellulitis  - ABX as noted above  - WBC is trending down and WNL this AM CKD (chronic kidney disease) stage 2, GFR 60-89 ml/min  - Cr remains stable  - BMP in AM  Acute on chronic blood loss anemia  - slight drop likely dilutional  - no signs of active bleeding  - CBC in AM   DVT prophylaxis  Heparin SQ while pt is in hospital  Code Status: Full  Family Communication: Pt at bedside  Disposition Plan: Home when medically stable  IV Access:   Peripheral IV Procedures and diagnostic studies:   Dg Chest 2 View 09/06/2013 No acute cardiopulmonary disease. Chest is stable from prior exam. Power port catheter in good anatomic position. Surgical clips upper chest.  Medical Consultants:   Surgery  Other Consultants:   Physical therapy  Anti-Infectives:   Vancomycin 8/25 -->  Maxipime 8/25 -->  Faye Ramsay, MD  [facility] Pager [phone]  If 7PM-7AM, please contact night-coverage [email or web address] Password [email or web address] 09/08/2013, 9:20 AM   LOS: 2 days    HPI/Subjective: No events overnight.   Objective: Filed Vitals:   09/07/13 0423 09/07/13 1343 09/07/13 2114 09/08/13 0458  BP: 135/63 137/70 122/62 124/53  Pulse: 84 83 95 74  Temp: 98.6 F (37 C) 99.9 F (37.7 C) 99.5 F (37.5 C) 97.6 F (36.4 C)  TempSrc: Oral Oral Oral Oral  Resp: 16 16 16 16   Height:      Weight:      SpO2: 97% 100% 98% 99%    Intake/Output Summary (Last 24 hours) at 09/08/13 0920 Last data filed at 09/08/13 0503  Gross per 24 hour  Intake   1040 ml  Output      0 ml  Net   1040 ml    Exam:   General:  Pt is alert, follows commands appropriately, not in acute distress  Cardiovascular: Regular rate and rhythm, S1/S2, no murmurs, no rubs, no gallops  Respiratory: Clear to auscultation bilaterally, no wheezing, no crackles, no rhonchi  Abdomen: Soft, non tender, non distended, bowel sounds present, no guarding  Chest: Right breast swelling improving with less erythema and less TTP   Data Reviewed: Basic Metabolic Panel:  Recent Labs Lab 09/06/13 0933 09/07/13 0405 09/08/13 0001  NA 140 140 138  K 4.0 3.5* 3.7  CL 100 103 103  CO2 21 20 22   GLUCOSE 169* 131* 109*  BUN 24* 20 16  CREATININE 1.18* 1.18* 1.11*  CALCIUM 9.6 8.7 8.6   Liver Function Tests:  Recent Labs Lab 09/06/13 0933 09/07/13 0405  AST 22 21  ALT 12 12  ALKPHOS 107 83  BILITOT 0.5 0.5  PROT 8.1 6.6  ALBUMIN 4.2 3.3*  CBC:  Recent Labs Lab 09/06/13 0933 09/07/13 0405 09/08/13 0001  WBC 12.5* 11.3* 7.4  NEUTROABS 11.2*  --   --   HGB 10.9* 9.5* 9.1*  HCT 33.3* 27.7* 27.6*  MCV 80.8 81.7 82.1  PLT 221 165 167   CBG:  Recent Labs Lab 09/07/13 0809 09/08/13 0738  GLUCAP 117* 144*    Recent Results (from the past 240 hour(s))  CULTURE, BLOOD (ROUTINE X 2)     Status: None   Collection Time    09/06/13  9:25 AM      Result Value Ref Range Status   Specimen Description BLOOD LEFT ARM   Final   Special Requests BOTTLES DRAWN AEROBIC AND  ANAEROBIC 5ML   Final   Culture  Setup Time     Final   Value: 09/06/2013 12:44     Performed at [facility]   Culture     Final   Value:        BLOOD CULTURE RECEIVED NO GROWTH TO DATE CULTURE WILL BE HELD FOR 5 DAYS BEFORE ISSUING A FINAL NEGATIVE REPORT     Performed at [facility]   Report Status PENDING   Incomplete  CULTURE, BLOOD (ROUTINE X 2)     Status: None   Collection Time    09/06/13  9:55 AM      Result Value Ref Range Status   Specimen Description BLOOD LEFT FOREARM   Final   Special Requests BOTTLES DRAWN AEROBIC AND ANAEROBIC 5ML   Final   Culture  Setup Time     Final   Value: 09/06/2013 12:44     Performed at [facility]   Culture     Final   Value:        BLOOD CULTURE RECEIVED NO GROWTH TO DATE CULTURE WILL BE HELD FOR 5 DAYS BEFORE ISSUING A FINAL NEGATIVE REPORT     Performed at [facility]   Report Status PENDING   Incomplete  URINE CULTURE     Status: None   Collection Time    09/06/13 10:40 AM      Result Value Ref Range Status   Specimen Description URINE, CLEAN CATCH   Final   Special Requests NONE   Final   Culture  Setup Time     Final   Value: 09/06/2013 14:43     Performed at [place]     Final   Value: NO GROWTH     Performed at [facility]   Culture     Final   Value: NO GROWTH     Performed at [facility]   Report Status 09/07/2013 FINAL   Final     Scheduled Meds: . amLODipine  10 mg Oral Daily  . aspirin  81 mg Oral Daily  . ceFEPime (MAXIPIME) IV  1 g Intravenous 3 times per day  . cholecalciferol  1,000 Units Oral Daily  . [START ON 09/13/2013] cloNIDine  0.2 mg Transdermal Weekly  . cycloSPORINE  1 drop Both Eyes BID  . gabapentin  100 mg Oral TID  . heparin  5,000 Units Subcutaneous 3 times per day  . hydrochlorothiazide  25 mg Oral Daily  . ketoconazole  1 application Topical BID  . labetalol  300 mg Oral BID  .  levothyroxine  125 mcg Oral QAC  breakfast  . LORazepam  0.5 mg Oral 3 times per day  . metFORMIN  500 mg Oral BID WC  . vancomycin  1,500 mg Intravenous Q24H   Continuous Infusions: . sodium chloride 50 mL/hr at 09/07/13 1710

## 2013-09-09 ENCOUNTER — Ambulatory Visit: Payer: Medicare Other

## 2013-09-09 LAB — BASIC METABOLIC PANEL
Anion gap: 14 (ref 5–15)
BUN: 14 mg/dL (ref 6–23)
CO2: 22 meq/L (ref 19–32)
CREATININE: 0.97 mg/dL (ref 0.50–1.10)
Calcium: 9.2 mg/dL (ref 8.4–10.5)
Chloride: 107 mEq/L (ref 96–112)
GFR calc Af Amer: 73 mL/min — ABNORMAL LOW (ref >90)
GFR, EST NON AFRICAN AMERICAN: 63 mL/min — AB (ref >90)
Glucose, Bld: 111 mg/dL — ABNORMAL HIGH (ref 70–99)
Potassium: 3.8 mEq/L (ref 3.7–5.3)
Sodium: 143 mEq/L (ref 137–147)

## 2013-09-09 LAB — CBC
HCT: 28.1 % — ABNORMAL LOW (ref 36.0–46.0)
Hemoglobin: 9.2 g/dL — ABNORMAL LOW (ref 12.0–15.0)
MCH: 26.8 pg (ref 26.0–34.0)
MCHC: 32.7 g/dL (ref 30.0–36.0)
MCV: 81.9 fL (ref 78.0–100.0)
PLATELETS: 173 10*3/uL (ref 150–400)
RBC: 3.43 MIL/uL — AB (ref 3.87–5.11)
RDW: 15.5 % (ref 11.5–15.5)
WBC: 5.4 10*3/uL (ref 4.0–10.5)

## 2013-09-09 LAB — GLUCOSE, CAPILLARY: Glucose-Capillary: 113 mg/dL — ABNORMAL HIGH (ref 70–99)

## 2013-09-09 MED ORDER — HYDROMORPHONE HCL PF 1 MG/ML IJ SOLN: 1.0000 mg | INTRAMUSCULAR | Status: DC | PRN

## 2013-09-09 MED ORDER — VANCOMYCIN HCL IN DEXTROSE 1-5 GM/200ML-% IV SOLN
1000.0000 mg | Freq: Two times a day (BID) | INTRAVENOUS | Status: DC
  Administered 2013-09-09 – 2013-09-11 (×5): 1000 mg via INTRAVENOUS
  Filled 2013-09-09 (×6): qty 200

## 2013-09-09 NOTE — Care Management Note (Deleted)
CARE MANAGEMENT NOTE 09/09/2013  Patient:  [name], [name]   Account Number:  [id number]  Date Initiated:  09/09/2013  Documentation initiated by:  Leafy Kindle  Subjective/Objective Assessment:   58 yo pt admitted with cellulitis of breast.  Hx of breast CA     Action/Plan:   Lives with Daughter   Anticipated DC Date:  09/11/2013   Anticipated DC Plan:  [place]  CM consult      Choice offered to / List presented to:             Status of service:  In process, will continue to follow Medicare Important Message given?   (If response is "NO", the following Medicare IM given date fields will be blank) Date Medicare IM given:   Medicare IM given by:   Date Additional Medicare IM given:   Additional Medicare IM given by:    Discharge Disposition:    Per UR Regulation:  Reviewed for med. necessity/level of care/duration of stay  If discussed at [place] of Stay Meetings, dates discussed:    Comments:  09/09/13 Marney Doctor RN,BSN,NCM Pt from home with daughter.  Receiving IV abx.  Awaiting PT eval for recommendations.  CM will assist with DC needs if indicated.

## 2013-09-09 NOTE — Progress Notes (Signed)
Patient ID: [name], female   DOB: 1955/09/04, 58 y.o.   MRN: [id number]  [facility] PROGRESS NOTE  [name] CWC:[id number] DOB: [birth year] DOA: 09/06/2013 PCP: Rubbie Battiest, MD  Brief narrative:  58 y/o ?, known h/o stage IIA invasive ductal carcinoma, presented with right breast swelling, erythema, pain. Admitted for treatment of presumptive cellulitis.   Assessment and Plan:   Principal Problem:  Cellulitis of breast, DDx inflammatroy Ca breast  - based on outlined bordered, it is improving  - continue Vanc and Maxipime day #4 - provide analgesia as needed  - WBC is trending down and is WNL this AM Active Problems:  Diabetes mellitus, type 2  - reasonable inpatient control  - continue as per home medical regimen  Hyperthyroidism  - continue synthroid  Hyperlipidemia  - continue statin  Hypokalemia  - supplemented and WNL this AM  Leukocytosis  - from cellulitis  - ABX as noted above  - WBC is trending down and WNL this AM  CKD (chronic kidney disease) stage 2, GFR 60-89 ml/min  - Cr remains stable and WNL  - BMP in AM  Acute on chronic blood loss anemia  - slight drop likely dilutional  - no signs of active bleeding  - CBC in AM   DVT prophylaxis  Heparin SQ while pt is in hospital  Code Status: Full  Family Communication: Pt at bedside  Disposition Plan: Home when medically stable   IV Access:   Peripheral IV Procedures and diagnostic studies:   Dg Chest 2 View 09/06/2013 No acute cardiopulmonary disease. Chest is stable from prior exam. Power port catheter in good anatomic position. Surgical clips upper chest.  Medical Consultants:   Surgery  Other Consultants:   Physical therapy  Anti-Infectives:   Vancomycin 8/25 -->  Maxipime 8/25 -->  Faye Ramsay, MD  [facility] Pager [phone]  If 7PM-7AM, please contact night-coverage [email or web address] Password [email or web address] 09/09/2013, 3:13 PM   LOS: 3 days   HPI/Subjective: No events overnight.    Objective: Filed Vitals:   09/08/13 0458 09/08/13 1549 09/08/13 2111 09/09/13 0604  BP: 124/53 135/75 120/60 129/77  Pulse: 74 79 81 86  Temp: 97.6 F (36.4 C) 98.7 F (37.1 C) 98.5 F (36.9 C) 98.2 F (36.8 C)  TempSrc: Oral Oral Oral Oral  Resp: 16 17 16 16   Height:      Weight:      SpO2: 99% 100% 96% 100%    Intake/Output Summary (Last 24 hours) at 09/09/13 1513 Last data filed at 09/09/13 1006  Gross per 24 hour  Intake    960 ml  Output      0 ml  Net    960 ml    Exam:   General:  Pt is alert, follows commands appropriately, not in acute distress  Cardiovascular: Regular rate and rhythm, S1/S2, no murmurs, no rubs, no gallops  Respiratory: Clear to auscultation bilaterally, no wheezing, no crackles, no rhonchi  Abdomen: Soft, non tender, non distended, bowel sounds present, no guarding, right breast erythema improving but still TTP    Data Reviewed: Basic Metabolic Panel:  Recent Labs Lab 09/06/13 0933 09/07/13 0405 09/08/13 0001 09/09/13 0020  NA 140 140 138 143  K 4.0 3.5* 3.7 3.8  CL 100 103 103 107  CO2 21 20 22 22   GLUCOSE 169* 131* 109* 111*  BUN 24* 20 16 14   CREATININE 1.18* 1.18* 1.11* 0.97  CALCIUM 9.6 8.7 8.6 9.2   Liver Function  Tests:  Recent Labs Lab 09/06/13 0933 09/07/13 0405  AST 22 21  ALT 12 12  ALKPHOS 107 83  BILITOT 0.5 0.5  PROT 8.1 6.6  ALBUMIN 4.2 3.3*   CBC:  Recent Labs Lab 09/06/13 0933 09/07/13 0405 09/08/13 0001 09/09/13 0020  WBC 12.5* 11.3* 7.4 5.4  NEUTROABS 11.2*  --   --   --   HGB 10.9* 9.5* 9.1* 9.2*  HCT 33.3* 27.7* 27.6* 28.1*  MCV 80.8 81.7 82.1 81.9  PLT 221 165 167 173   CBG:  Recent Labs Lab 09/07/13 0809 09/08/13 0738 09/09/13 0746  GLUCAP 117* 144* 113*    Recent Results (from the past 240 hour(s))  CULTURE, BLOOD (ROUTINE X 2)     Status: None   Collection Time    09/06/13  9:25 AM      Result Value Ref Range Status   Specimen Description BLOOD LEFT ARM   Final    Special Requests BOTTLES DRAWN AEROBIC AND ANAEROBIC 5ML   Final   Culture  Setup Time     Final   Value: 09/06/2013 12:44     Performed at [facility]   Culture     Final   Value:        BLOOD CULTURE RECEIVED NO GROWTH TO DATE CULTURE WILL BE HELD FOR 5 DAYS BEFORE ISSUING A FINAL NEGATIVE REPORT     Performed at [facility]   Report Status PENDING   Incomplete  CULTURE, BLOOD (ROUTINE X 2)     Status: None   Collection Time    09/06/13  9:55 AM      Result Value Ref Range Status   Specimen Description BLOOD LEFT FOREARM   Final   Special Requests BOTTLES DRAWN AEROBIC AND ANAEROBIC 5ML   Final   Culture  Setup Time     Final   Value: 09/06/2013 12:44     Performed at [facility]   Culture     Final   Value:        BLOOD CULTURE RECEIVED NO GROWTH TO DATE CULTURE WILL BE HELD FOR 5 DAYS BEFORE ISSUING A FINAL NEGATIVE REPORT     Performed at [facility]   Report Status PENDING   Incomplete  URINE CULTURE     Status: None   Collection Time    09/06/13 10:40 AM      Result Value Ref Range Status   Specimen Description URINE, CLEAN CATCH   Final   Special Requests NONE   Final   Culture  Setup Time     Final   Value: 09/06/2013 14:43     Performed at [place]     Final   Value: NO GROWTH     Performed at [facility]   Culture     Final   Value: NO GROWTH     Performed at [facility]   Report Status 09/07/2013 FINAL   Final     Scheduled Meds: . amLODipine  10 mg Oral Daily  . aspirin  81 mg Oral Daily  . ceFEPime (MAXIPIME) IV  1 g Intravenous 3 times per day  . cholecalciferol  1,000 Units Oral Daily  . [START ON 09/13/2013] cloNIDine  0.2 mg Transdermal Weekly  . cycloSPORINE  1 drop Both Eyes BID  . gabapentin  100 mg Oral TID  . heparin  5,000 Units Subcutaneous 3 times per day  .  hydrochlorothiazide  25 mg Oral Daily  . ketoconazole  1 application Topical BID  . labetalol  300 mg Oral  BID  . levothyroxine  125 mcg Oral QAC breakfast  . LORazepam  0.5 mg Oral 3 times per day  . metFORMIN  500 mg Oral BID WC  . vancomycin  1,000 mg Intravenous BID   Continuous Infusions:

## 2013-09-09 NOTE — Evaluation (Signed)
Physical Therapy Evaluation Patient Details Name: MAKALYN LENNOX MRN: [id number] DOB: February 05, 1955 Today's Date: 09/09/2013   History of Present Illness  58 yo female admitted with R breast cellulitis. Hx of breast cancer, R breast lumpectomy ~7 weeks ago, HTN, DM, Grave's disease.   Clinical Impression  On eval, pt was supervision level assist for mobility-able to ambulate ~300 feet with walker. Mobilizes very well. Encouraged pt to walk over weekend. Do not anticipate any follow up needs.     Follow Up Recommendations No PT follow up;Supervision - Intermittent    Equipment Recommendations  None recommended by PT    Recommendations for Other Services       Precautions / Restrictions        Mobility  Bed Mobility Overal bed mobility: Modified Independent                Transfers Overall transfer level: Needs assistance Equipment used: Rolling walker (2 wheeled) Transfers: Sit to/from Stand Sit to Stand: From elevated surface;Supervision            Ambulation/Gait Ambulation/Gait assistance: Supervision Ambulation Distance (Feet): 300 Feet Assistive device: Rolling walker (2 wheeled)       General Gait Details: slow gait speed. no lob. tolerated well.   Stairs            Wheelchair Mobility    Modified Rankin (Stroke Patients Only)       Balance                                             Pertinent Vitals/Pain Pain Assessment: No/denies pain    Home Living Family/patient expects to be discharged to:: Private residence Living Arrangements: Children Available Help at Discharge: Family Type of Home: House Home Access: Stairs to enter Entrance Stairs-Rails: None Entrance Stairs-Number of Steps: 1 Home Layout: One level Home Equipment: Environmental consultant - 2 wheels;Cane - quad;Bedside commode;Adaptive equipment      Prior Function Level of Independence: Independent with assistive device(s)         Comments: cane in home.  walker for community ambulation     Hand Dominance        Extremity/Trunk Assessment   Upper Extremity Assessment: Overall WFL for tasks assessed           Lower Extremity Assessment: LLE deficits/detail   LLE Deficits / Details: Decreased ROM L knee-unable to flex-residual deficit from previous TKA ~15 years ago per pt  Cervical / Trunk Assessment: Normal  Communication   Communication: No difficulties  Cognition Arousal/Alertness: Awake/alert Behavior During Therapy: WFL for tasks assessed/performed Overall Cognitive Status: Within Functional Limits for tasks assessed                      General Comments      Exercises        Assessment/Plan    PT Assessment Patient needs continued PT services  PT Diagnosis Difficulty walking;Abnormality of gait   PT Problem List Decreased mobility;Obesity  PT Treatment Interventions Gait training;Functional mobility training;Therapeutic activities;Patient/family education;Balance training;Therapeutic exercise   PT Goals (Current goals can be found in the Care Plan section) Acute Rehab PT Goals Patient Stated Goal: home soon PT Goal Formulation: With patient Time For Goal Achievement: 09/16/13 Potential to Achieve Goals: Good    Frequency Min 2X/week   Barriers to discharge  Co-evaluation               End of Session   Activity Tolerance: Patient tolerated treatment well Patient left: with call bell/phone within reach;with family/visitor present           Time: 5852-7782 PT Time Calculation (min): 16 min   Charges:   PT Evaluation $Initial PT Evaluation Tier I: 1 Procedure PT Treatments $Gait Training: 8-22 mins   PT G Codes:          Weston Anna, MPT Pager: [phone]

## 2013-09-09 NOTE — Progress Notes (Signed)
ANTIBIOTIC CONSULT NOTE - IFollow up  Pharmacy Consult for Vancomycin, Cefepime Indication: sepsis  Allergies  Allergen Reactions  . Procardia [Nifedipine] Other (See Comments)    MIGRAINES    Patient Measurements: Height: 5' 4.5" (163.8 cm) Weight: 251 lb (113.853 kg) IBW/kg (Calculated) : 55.85 ABW: 79kg  Assessment: 85 yoF known h/o stage IIA invasive ductal carcinoma admitted 8/25 with breast pain with fever and chills. Patient admitted with mild sepsis secondary to cellulitis of breast. Pharmacy is consulted to dose empiric vancomycin and cefepime for cellulitis.   Antiinfectives, day 4 antibiotics 8/25 >> vancomycin >> 8/25 >> cefepime >>  Labs / vitals Tmax: remains afebrile (last fever 8/25) WBC: improved to WNL Renal: SCr improved to 0.97 (baseline ~1.2), CrCl~ 71 ml/min (normalized), ~78 ml/min (CG) Lactic acid: 2.97 (8/25)  Microbiology 8/25 blood x 2: ngtd 8/25 urine: NGF  Levels/dose adjustments: 8/28: CrCl (N) improved to >60 ml/min, incr vanc dose to 1g q12h per nomogram   Goal of Therapy:  Vancomycin trough level 15-20 mcg/ml Eradication of infection Doses adjusted per renal function  Plan:  - change vancomycin to 1g IV q12h  per obese dosing nomogram for improved CrCl - continue cefepime 1g IV q8h. - vancomycin trough at steady state if indicated - follow-up clinical course, culture results, renal function - follow-up antibiotic de-escalation and length of therapy  Thank you for the consult.  Currie Paris, PharmD, BCPS Pager: [phone] Pharmacy: [phone] 09/09/2013 10:02 AM

## 2013-09-09 NOTE — Care Management Note (Signed)
CARE MANAGEMENT NOTE 09/09/2013  Patient:  HAYVEN, CROY   Account Number:  [id number]  Date Initiated:  09/09/2013  Documentation initiated by:  Leafy Kindle  Subjective/Objective Assessment:   58 yo pt admitted with cellulitis of breast.  Hx of breast CA     Action/Plan:   Lives with Daughter   Anticipated DC Date:  09/11/2013   Anticipated DC Plan:  [place]  CM consult      Choice offered to / List presented to:             Status of service:  In process, will continue to follow Medicare Important Message given?  YES (If response is "NO", the following Medicare IM given date fields will be blank) Date Medicare IM given:  09/09/2013 Medicare IM given by:  Leafy Kindle Date Additional Medicare IM given:   Additional Medicare IM given by:    Discharge Disposition:    Per UR Regulation:  Reviewed for med. necessity/level of care/duration of stay  If discussed at [place] of Stay Meetings, dates discussed:    Comments:  09/09/13 Marney Doctor RN,BSN,NCM Pt from home with daughter.  Receiving IV abx.  Awaiting PT eval for recommendations.  CM will assist with DC needs if indicated.

## 2013-09-10 LAB — CBC
HCT: 27.5 % — ABNORMAL LOW (ref 36.0–46.0)
Hemoglobin: 9.4 g/dL — ABNORMAL LOW (ref 12.0–15.0)
MCH: 27.3 pg (ref 26.0–34.0)
MCHC: 34.2 g/dL (ref 30.0–36.0)
MCV: 79.9 fL (ref 78.0–100.0)
Platelets: 211 10*3/uL (ref 150–400)
RBC: 3.44 MIL/uL — AB (ref 3.87–5.11)
RDW: 15.5 % (ref 11.5–15.5)
WBC: 5.1 10*3/uL (ref 4.0–10.5)

## 2013-09-10 LAB — BASIC METABOLIC PANEL
Anion gap: 14 (ref 5–15)
BUN: 12 mg/dL (ref 6–23)
CO2: 23 meq/L (ref 19–32)
CREATININE: 0.98 mg/dL (ref 0.50–1.10)
Calcium: 9 mg/dL (ref 8.4–10.5)
Chloride: 105 mEq/L (ref 96–112)
GFR calc Af Amer: 72 mL/min — ABNORMAL LOW (ref >90)
GFR calc non Af Amer: 62 mL/min — ABNORMAL LOW (ref >90)
Glucose, Bld: 166 mg/dL — ABNORMAL HIGH (ref 70–99)
Potassium: 3.9 mEq/L (ref 3.7–5.3)
Sodium: 142 mEq/L (ref 137–147)

## 2013-09-10 LAB — GLUCOSE, CAPILLARY: Glucose-Capillary: 119 mg/dL — ABNORMAL HIGH (ref 70–99)

## 2013-09-10 NOTE — Progress Notes (Signed)
  Subjective: Doing well no fevers and WBC normal. Denies right breast pain  Objective: Vital signs in last 24 hours: Temp:  [98.5 F (36.9 C)-98.6 F (37 C)] 98.5 F (36.9 C) (08/29 0528) Pulse Rate:  [69-80] 69 (08/29 0528) Resp:  [16] 16 (08/29 0528) BP: (140-147)/(65-79) 140/79 mmHg (08/29 0528) SpO2:  [96 %-100 %] 96 % (08/29 0528) Last BM Date: 09/08/13  Intake/Output from previous day: 08/28 0701 - 08/29 0700 In: 1570 [P.O.:1320; IV Piggyback:250] Out: -  Intake/Output this shift:    Incision/Wound:right breast erythema much improved.  Soft less indurated.  No abscess  Lab Results:   Recent Labs  09/09/13 0020 09/10/13 0015  WBC 5.4 5.1  HGB 9.2* 9.4*  HCT 28.1* 27.5*  PLT 173 211   BMET  Recent Labs  09/09/13 0020 09/10/13 0015  NA 143 142  K 3.8 3.9  CL 107 105  CO2 22 23  GLUCOSE 111* 166*  BUN 14 12  CREATININE 0.97 0.98  CALCIUM 9.2 9.0   PT/INR No results found for this basename: LABPROT, INR,  in the last 72 hours ABG No results found for this basename: PHART, PCO2, PO2, HCO3,  in the last 72 hours  Studies/Results: No results found.  Anti-infectives: Anti-infectives   Start     Dose/Rate Route Frequency Ordered Stop   09/09/13 1100  vancomycin (VANCOCIN) IVPB 1000 mg/200 mL premix     1,000 mg 200 mL/hr over 60 Minutes Intravenous 2 times daily 09/09/13 0959     09/07/13 1400  vancomycin (VANCOCIN) 1,500 mg in sodium chloride 0.9 % 500 mL IVPB  Status:  Discontinued     1,500 mg 250 mL/hr over 120 Minutes Intravenous Every 24 hours 09/06/13 1346 09/09/13 0959   09/06/13 1400  ceFEPIme (MAXIPIME) 1 g in dextrose 5 % 50 mL IVPB  Status:  Discontinued     1 g 100 mL/hr over 30 Minutes Intravenous Every 12 hours 09/06/13 1318 09/06/13 1347   09/06/13 1400  vancomycin (VANCOCIN) 1,500 mg in sodium chloride 0.9 % 500 mL IVPB     1,500 mg 250 mL/hr over 120 Minutes Intravenous  Once 09/06/13 1346 09/06/13 1912   09/06/13 1400   ceFEPIme (MAXIPIME) 1 g in dextrose 5 % 50 mL IVPB     1 g 100 mL/hr over 30 Minutes Intravenous 3 times per day 09/06/13 1347     09/06/13 1330  vancomycin (VANCOCIN) IVPB 1000 mg/200 mL premix  Status:  Discontinued     1,000 mg 200 mL/hr over 60 Minutes Intravenous Every 24 hours 09/06/13 1318 09/06/13 1346   09/06/13 1015  vancomycin (VANCOCIN) IVPB 1000 mg/200 mL premix     1,000 mg 200 mL/hr over 60 Minutes Intravenous  Once 09/06/13 1004 09/06/13 1155      Assessment/Plan: RIGHT BREAST CELLULITIS WITH HX RIGHT BREAST LUMPECTOMY 2 MONTHS AGO FOR STAGE 2 RIGHT BREAST CANCER LOOKS MUCH BETTER CAN GO HOME WHEN MEDICALLY READY FOLLOW UP WITH ME IN 10 -14 DAYS IN THE OFFICE WILL NEED PO ABX AT DISCHARGE   LOS: 4 days    Rakesh Dutko A. 09/10/2013

## 2013-09-10 NOTE — Progress Notes (Signed)
Patient ID: [name], female   DOB: 10/10/1955, 58 y.o.   MRN: [id number]  [facility] PROGRESS NOTE  AVAYA MCJUNKINS JQB:[id number] DOB: 1955-05-25 DOA: 09/06/2013 PCP: Rubbie Battiest, MD  Brief narrative:  58 y/o ?, known h/o stage IIA invasive ductal carcinoma, presented with right breast swelling, erythema, pain. Admitted for treatment of presumptive cellulitis.   Assessment and Plan:   Principal Problem:  Cellulitis of breast, DDx inflammatroy Ca breast  - based on outlined bordered, it is improving  - continue Vanc and Maxipime day #5, transition to oral ABX - provide analgesia as needed  - WBC is trending down and is WNL this AM  Active Problems:  Diabetes mellitus, type 2  - reasonable inpatient control  - continue as per home medical regimen  Hyperthyroidism  - continue synthroid  Hyperlipidemia  - continue statin  Hypokalemia  - supplemented and WNL this AM  Leukocytosis  - from cellulitis  - ABX as noted above and transition to oral ABX in AM - WBC is trending down and WNL this AM  CKD (chronic kidney disease) stage 2, GFR 60-89 ml/min  - Cr remains stable and WNL  - BMP in AM  Acute on chronic blood loss anemia  - slight drop likely dilutional  - no signs of active bleeding  - CBC in AM   DVT prophylaxis  Heparin SQ while pt is in hospital  Code Status: Full  Family Communication: Pt at bedside  Disposition Plan: Home possibly in AM  IV Access:   Peripheral IV Procedures and diagnostic studies:   Dg Chest 2 View 09/06/2013 No acute cardiopulmonary disease. Chest is stable from prior exam. Power port catheter in good anatomic position. Surgical clips upper chest.  Medical Consultants:   Surgery  Other Consultants:   Physical therapy  Anti-Infectives:   Vancomycin 8/25 -->  Maxipime 8/25 -->  Faye Ramsay, MD  [facility] Pager [phone]  If 7PM-7AM, please contact night-coverage [email or web address] Password [facility] 09/10/2013, 9:58 AM   LOS: 4  days   HPI/Subjective: No events overnight.   Objective: Filed Vitals:   09/09/13 0604 09/09/13 1510 09/09/13 2151 09/10/13 0528  BP: 129/77 144/73 147/65 140/79  Pulse: 86 80 80 69  Temp: 98.2 F (36.8 C) 98.6 F (37 C) 98.6 F (37 C) 98.5 F (36.9 C)  TempSrc: Oral Oral Oral Oral  Resp: 16 16 16 16   Height:      Weight:      SpO2: 100% 100% 99% 96%    Intake/Output Summary (Last 24 hours) at 09/10/13 0958 Last data filed at 09/10/13 0908  Gross per 24 hour  Intake   1810 ml  Output      0 ml  Net   1810 ml    Exam:   General:  Pt is alert, follows commands appropriately, not in acute distress  Cardiovascular: Regular rate and rhythm, S1/S2, no murmurs, no rubs, no gallops  Respiratory: Clear to auscultation bilaterally, no wheezing, no crackles, no rhonchi  Abdomen: Soft, non tender, non distended, bowel sounds present, no guarding  Extremities: No edema, pulses DP and PT palpable bilaterally  Chest: breast erythema much improved   Neuro: Grossly nonfocal  Data Reviewed: Basic Metabolic Panel:  Recent Labs Lab 09/06/13 0933 09/07/13 0405 09/08/13 0001 09/09/13 0020 09/10/13 0015  NA 140 140 138 143 142  K 4.0 3.5* 3.7 3.8 3.9  CL 100 103 103 107 105  CO2 21 20 22 22 23   GLUCOSE  169* 131* 109* 111* 166*  BUN 24* 20 16 14 12   CREATININE 1.18* 1.18* 1.11* 0.97 0.98  CALCIUM 9.6 8.7 8.6 9.2 9.0   Liver Function Tests:  Recent Labs Lab 09/06/13 0933 09/07/13 0405  AST 22 21  ALT 12 12  ALKPHOS 107 83  BILITOT 0.5 0.5  PROT 8.1 6.6  ALBUMIN 4.2 3.3*   No results found for this basename: LIPASE, AMYLASE,  in the last 168 hours No results found for this basename: AMMONIA,  in the last 168 hours CBC:  Recent Labs Lab 09/06/13 0933 09/07/13 0405 09/08/13 0001 09/09/13 0020 09/10/13 0015  WBC 12.5* 11.3* 7.4 5.4 5.1  NEUTROABS 11.2*  --   --   --   --   HGB 10.9* 9.5* 9.1* 9.2* 9.4*  HCT 33.3* 27.7* 27.6* 28.1* 27.5*  MCV 80.8 81.7  82.1 81.9 79.9  PLT 221 165 167 173 211   CBG:  Recent Labs Lab 09/07/13 0809 09/08/13 0738 09/09/13 0746 09/10/13 0738  GLUCAP 117* 144* 113* 119*    Recent Results (from the past 240 hour(s))  CULTURE, BLOOD (ROUTINE X 2)     Status: None   Collection Time    09/06/13  9:25 AM      Result Value Ref Range Status   Specimen Description BLOOD LEFT ARM   Final   Special Requests BOTTLES DRAWN AEROBIC AND ANAEROBIC 5ML   Final   Culture  Setup Time     Final   Value: 09/06/2013 12:44     Performed at [facility]   Culture     Final   Value:        BLOOD CULTURE RECEIVED NO GROWTH TO DATE CULTURE WILL BE HELD FOR 5 DAYS BEFORE ISSUING A FINAL NEGATIVE REPORT     Performed at [facility]   Report Status PENDING   Incomplete  CULTURE, BLOOD (ROUTINE X 2)     Status: None   Collection Time    09/06/13  9:55 AM      Result Value Ref Range Status   Specimen Description BLOOD LEFT FOREARM   Final   Special Requests BOTTLES DRAWN AEROBIC AND ANAEROBIC 5ML   Final   Culture  Setup Time     Final   Value: 09/06/2013 12:44     Performed at [facility]   Culture     Final   Value:        BLOOD CULTURE RECEIVED NO GROWTH TO DATE CULTURE WILL BE HELD FOR 5 DAYS BEFORE ISSUING A FINAL NEGATIVE REPORT     Performed at [facility]   Report Status PENDING   Incomplete  URINE CULTURE     Status: None   Collection Time    09/06/13 10:40 AM      Result Value Ref Range Status   Specimen Description URINE, CLEAN CATCH   Final   Special Requests NONE   Final   Culture  Setup Time     Final   Value: 09/06/2013 14:43     Performed at [place]     Final   Value: NO GROWTH     Performed at [facility]   Culture     Final   Value: NO GROWTH     Performed at [facility]   Report Status 09/07/2013 FINAL   Final     Scheduled Meds: . amLODipine  10 mg Oral Daily  .  aspirin  81 mg Oral Daily  . ceFEPime  (MAXIPIME) IV  1 g Intravenous 3 times per day  . cholecalciferol  1,000 Units Oral Daily  . [START ON 09/13/2013] cloNIDine  0.2 mg Transdermal Weekly  . cycloSPORINE  1 drop Both Eyes BID  . gabapentin  100 mg Oral TID  . heparin  5,000 Units Subcutaneous 3 times per day  . hydrochlorothiazide  25 mg Oral Daily  . ketoconazole  1 application Topical BID  . labetalol  300 mg Oral BID  . levothyroxine  125 mcg Oral QAC breakfast  . LORazepam  0.5 mg Oral 3 times per day  . metFORMIN  500 mg Oral BID WC  . vancomycin  1,000 mg Intravenous BID   Continuous Infusions:

## 2013-09-11 LAB — BASIC METABOLIC PANEL
Anion gap: 17 — ABNORMAL HIGH (ref 5–15)
BUN: 13 mg/dL (ref 6–23)
CALCIUM: 9.5 mg/dL (ref 8.4–10.5)
CO2: 24 meq/L (ref 19–32)
CREATININE: 1.08 mg/dL (ref 0.50–1.10)
Chloride: 98 mEq/L (ref 96–112)
GFR calc Af Amer: 64 mL/min — ABNORMAL LOW (ref >90)
GFR calc non Af Amer: 55 mL/min — ABNORMAL LOW (ref >90)
GLUCOSE: 132 mg/dL — AB (ref 70–99)
Potassium: 3.7 mEq/L (ref 3.7–5.3)
Sodium: 139 mEq/L (ref 137–147)

## 2013-09-11 LAB — GLUCOSE, CAPILLARY: GLUCOSE-CAPILLARY: 124 mg/dL — AB (ref 70–99)

## 2013-09-11 LAB — CBC
HCT: 29.5 % — ABNORMAL LOW (ref 36.0–46.0)
HEMOGLOBIN: 10.1 g/dL — AB (ref 12.0–15.0)
MCH: 27.2 pg (ref 26.0–34.0)
MCHC: 34.2 g/dL (ref 30.0–36.0)
MCV: 79.5 fL (ref 78.0–100.0)
Platelets: 230 10*3/uL (ref 150–400)
RBC: 3.71 MIL/uL — ABNORMAL LOW (ref 3.87–5.11)
RDW: 15.3 % (ref 11.5–15.5)
WBC: 5.3 10*3/uL (ref 4.0–10.5)

## 2013-09-11 MED ORDER — LORAZEPAM 0.5 MG PO TABS: 0.5000 mg | ORAL_TABLET | Freq: Three times a day (TID) | ORAL | Status: DC

## 2013-09-11 MED ORDER — CLINDAMYCIN HCL 300 MG PO CAPS: 300.0000 mg | ORAL_CAPSULE | Freq: Three times a day (TID) | ORAL | Status: DC

## 2013-09-11 MED ORDER — OXYCODONE-ACETAMINOPHEN 5-325 MG PO TABS: 1.0000 | ORAL_TABLET | ORAL | Status: DC | PRN

## 2013-09-11 NOTE — Discharge Instructions (Signed)

## 2013-09-11 NOTE — Discharge Summary (Signed)
Physician Discharge Summary  [name] [id number]:[id number] DOB: [birth year] DOA: 09/06/2013  PCP: Rubbie Battiest, MD  Admit date: 09/06/2013 Discharge date: 09/11/2013  Recommendations for Outpatient Follow-up:  1. Pt will need to follow up with PCP in 2-3 weeks post discharge 2. Please obtain BMP to evaluate electrolytes and kidney function 3. Please also check CBC to evaluate Hg and Hct levels 4. Clindamycin provided on discharge   Discharge Diagnoses:  Principal Problem:   Cellulitis of breast, DDx inflammatroy Ca breast Active Problems:   Diabetes mellitus, type 2   Hyperthyroidism   Hyperlipidemia   Obesity   Asthma   Osteoarthritis   CKD (chronic kidney disease) stage 2, GFR 60-89 ml/min  Discharge Condition: Stable  Diet recommendation: Heart healthy diet discussed in details   Brief narrative:  58 y/o ?, known h/o stage IIA invasive ductal carcinoma, presented with right breast swelling, erythema, pain. Admitted for treatment of presumptive cellulitis.   Assessment and Plan:   Principal Problem:  Cellulitis of breast, DDx inflammatroy Ca breast  - based on outlined bordered, it is improving  - continue Vanc and Maxipime day #6, transition to oral ABX Clindamycin  - provide analgesia as needed  - WBC is trending down and is WNL this AM  Active Problems:  Diabetes mellitus, type 2  - reasonable inpatient control  - continue as per home medical regimen  Hyperthyroidism  - continue synthroid  Hyperlipidemia  - continue statin  Hypokalemia  - supplemented and WNL this AM  Leukocytosis  - from cellulitis  - ABX as noted above and transition to oral ABX   - WBC is trending down and WNL this AM  CKD (chronic kidney disease) stage 2, GFR 60-89 ml/min  - Cr remains stable and WNL  Acute on chronic blood loss anemia  - slight drop likely dilutional  - no signs of active bleeding  DVT prophylaxis  Heparin SQ while pt is in hospital  Code Status: Full  Family  Communication: Pt at bedside  Disposition Plan: Home   IV Access:   Peripheral IV Procedures and diagnostic studies:   Dg Chest 2 View 09/06/2013 No acute cardiopulmonary disease. Chest is stable from prior exam. Power port catheter in good anatomic position. Surgical clips upper chest.  Medical Consultants:   Surgery  Other Consultants:   Physical therapy  Anti-Infectives:   Vancomycin 8/25 --> 8/30 Maxipime 8/25 --> 8/30 Clindamycin on discharge    Discharge Exam: Filed Vitals:   09/11/13 0534  BP: 139/74  Pulse: 80  Temp: 97.9 F (36.6 C)  Resp: 16   Filed Vitals:   09/10/13 0528 09/10/13 1413 09/10/13 2117 09/11/13 0534  BP: 140/79 141/77 150/67 139/74  Pulse: 69 78 67 80  Temp: 98.5 F (36.9 C) 98.3 F (36.8 C) 98.7 F (37.1 C) 97.9 F (36.6 C)  TempSrc: Oral Oral Oral Oral  Resp: 16 16 16 16   Height:      Weight:      SpO2: 96% 98% 94% 95%    General: Pt is alert, follows commands appropriately, not in acute distress Cardiovascular: Regular rate and rhythm, S1/S2 +, no murmurs, no rubs, no gallops Respiratory: Clear to auscultation bilaterally, no wheezing, no crackles, no rhonchi Abdominal: Soft, non tender, non distended, bowel sounds +, no guarding  Discharge Instructions  Discharge Instructions   Diet - low sodium heart healthy    Complete by:  As directed      Increase activity slowly  Complete by:  As directed             Medication List         acetaminophen 500 MG tablet  Commonly known as:  TYLENOL  Take 1,000 mg by mouth every 4 (four) hours as needed for moderate pain.     albuterol 108 (90 BASE) MCG/ACT inhaler  Commonly known as:  PROVENTIL HFA;VENTOLIN HFA  Inhale 2 puffs into the lungs every 4 (four) hours as needed.     albuterol (2.5 MG/3ML) 0.083% nebulizer solution  Commonly known as:  PROVENTIL  Take 3 mLs (2.5 mg total) by nebulization every 6 (six) hours as needed for wheezing or shortness of breath.     amLODipine  10 MG tablet  Commonly known as:  NORVASC  Take 10 mg by mouth daily.     artificial tears ointment  Place 1 drop into both eyes as needed (for grey eye disease).     aspirin 81 MG tablet  Take 81 mg by mouth daily.     calcium carbonate 750 MG chewable tablet  Commonly known as:  TUMS EX  Chew 2 tablets by mouth 4 (four) times daily.     cholecalciferol 1000 UNITS tablet  Commonly known as:  VITAMIN D  Take 1,000 Units by mouth daily.     clindamycin 300 MG capsule  Commonly known as:  CLEOCIN  Take 1 capsule (300 mg total) by mouth 3 (three) times daily.     cloNIDine 0.2 mg/24hr patch  Commonly known as:  CATAPRES - Dosed in mg/24 hr  Place 0.2 mg onto the skin once a week. Sunday/Mondays.     cycloSPORINE 0.05 % ophthalmic emulsion  Commonly known as:  RESTASIS  Place 1 drop into both eyes 2 (two) times daily.     gabapentin 100 MG capsule  Commonly known as:  NEURONTIN  Take 1 capsule (100 mg total) by mouth 3 (three) times daily.     hydrochlorothiazide 25 MG tablet  Commonly known as:  HYDRODIURIL  Take 25 mg by mouth daily.     ketoconazole 2 % cream  Commonly known as:  NIZORAL  Apply 1 application topically 2 (two) times daily.     labetalol 300 MG tablet  Commonly known as:  NORMODYNE  Take 300 mg by mouth 2 (two) times daily.     levothyroxine 125 MCG tablet  Commonly known as:  SYNTHROID, LEVOTHROID  Take 1 tablet (125 mcg total) by mouth daily before breakfast.     LORazepam 0.5 MG tablet  Commonly known as:  ATIVAN  Take 1 tablet (0.5 mg total) by mouth every 8 (eight) hours.     metFORMIN 500 MG tablet  Commonly known as:  GLUCOPHAGE  Take 500 mg by mouth 2 (two) times daily with a meal.     oxyCODONE-acetaminophen 5-325 MG per tablet  Commonly known as:  ROXICET  Take 1-2 tablets by mouth every 4 (four) hours as needed.     pravastatin 80 MG tablet  Commonly known as:  PRAVACHOL  Take 40 mg by mouth at bedtime.     vitamin B-12 500  MCG tablet  Commonly known as:  CYANOCOBALAMIN  Take 500 mcg by mouth daily.           Follow-up Information   Follow up with CORNETT,THOMAS A., MD In 2 weeks.   Specialty:  General Surgery   Contact information:   [place]  [address] [phone]       Schedule an appointment as soon as possible for a visit with Rubbie Battiest, MD.   Specialty:  Family Medicine   Contact information:   [place] [address] [phone]       Follow up with Faye Ramsay, MD. (As needed call my cell phone [phone])    Specialty:  Internal Medicine   Contact information:   [address] [place] [address] [phone]        The results of significant diagnostics from this hospitalization (including imaging, microbiology, ancillary and laboratory) are listed below for reference.     Microbiology: Recent Results (from the past 240 hour(s))  CULTURE, BLOOD (ROUTINE X 2)     Status: None   Collection Time    09/06/13  9:25 AM      Result Value Ref Range Status   Specimen Description BLOOD LEFT ARM   Final   Special Requests BOTTLES DRAWN AEROBIC AND ANAEROBIC 5ML   Final   Culture  Setup Time     Final   Value: 09/06/2013 12:44     Performed at [facility]   Culture     Final   Value:        BLOOD CULTURE RECEIVED NO GROWTH TO DATE CULTURE WILL BE HELD FOR 5 DAYS BEFORE ISSUING A FINAL NEGATIVE REPORT     Performed at [facility]   Report Status PENDING   Incomplete  CULTURE, BLOOD (ROUTINE X 2)     Status: None   Collection Time    09/06/13  9:55 AM      Result Value Ref Range Status   Specimen Description BLOOD LEFT FOREARM   Final   Special Requests BOTTLES DRAWN AEROBIC AND ANAEROBIC 5ML   Final   Culture  Setup Time     Final   Value: 09/06/2013 12:44     Performed at [facility]   Culture     Final   Value:        BLOOD CULTURE RECEIVED NO GROWTH TO DATE CULTURE  WILL BE HELD FOR 5 DAYS BEFORE ISSUING A FINAL NEGATIVE REPORT     Performed at [facility]   Report Status PENDING   Incomplete  URINE CULTURE     Status: None   Collection Time    09/06/13 10:40 AM      Result Value Ref Range Status   Specimen Description URINE, CLEAN CATCH   Final   Special Requests NONE   Final   Culture  Setup Time     Final   Value: 09/06/2013 14:43     Performed at [place]     Final   Value: NO GROWTH     Performed at [facility]   Culture     Final   Value: NO GROWTH     Performed at [facility]   Report Status 09/07/2013 FINAL   Final     Labs: Basic Metabolic Panel:  Recent Labs Lab 09/07/13 0405 09/08/13 0001 09/09/13 0020 09/10/13 0015 09/11/13 0015  NA 140 138 143 142 139  K 3.5* 3.7 3.8 3.9 3.7  CL 103 103 107 105 98  CO2 20 22 22 23 24   GLUCOSE 131* 109* 111* 166* 132*  BUN 20 16 14 12 13   CREATININE 1.18* 1.11* 0.97 0.98 1.08  CALCIUM 8.7 8.6 9.2 9.0 9.5  Liver Function Tests:  Recent Labs Lab 09/06/13 0933 09/07/13 0405  AST 22 21  ALT 12 12  ALKPHOS 107 83  BILITOT 0.5 0.5  PROT 8.1 6.6  ALBUMIN 4.2 3.3*   No results found for this basename: LIPASE, AMYLASE,  in the last 168 hours No results found for this basename: AMMONIA,  in the last 168 hours CBC:  Recent Labs Lab 09/06/13 0933 09/07/13 0405 09/08/13 0001 09/09/13 0020 09/10/13 0015 09/11/13 0015  WBC 12.5* 11.3* 7.4 5.4 5.1 5.3  NEUTROABS 11.2*  --   --   --   --   --   HGB 10.9* 9.5* 9.1* 9.2* 9.4* 10.1*  HCT 33.3* 27.7* 27.6* 28.1* 27.5* 29.5*  MCV 80.8 81.7 82.1 81.9 79.9 79.5  PLT 221 165 167 173 211 230   CBG:  Recent Labs Lab 09/07/13 0809 09/08/13 0738 09/09/13 0746 09/10/13 0738 09/11/13 0808  GLUCAP 117* 144* 113* 119* 124*     SIGNED: Time coordinating discharge: Over 30 minutes  MAGICK-MYERS, ISKRA, MD  [facility] 09/11/2013, 10:11 AM Pager [phone]  If 7PM-7AM,  please contact night-coverage [email or web address] Password [email or web address]

## 2013-09-11 NOTE — Progress Notes (Signed)
Patient was stable at time of discharge. I removed her IV. I reviewed the discharge education with the patient and her daughter. They verbalized understanding and had no further questions. Patient left with her belongings and prescriptions in hand.

## 2013-09-11 NOTE — Progress Notes (Signed)
  Subjective: No complaints.  Breast feels ok.   Objective: Vital signs in last 24 hours: Temp:  [97.9 F (36.6 C)-98.7 F (37.1 C)] 97.9 F (36.6 C) (08/30 0534) Pulse Rate:  [67-80] 80 (08/30 0534) Resp:  [16] 16 (08/30 0534) BP: (139-150)/(67-77) 139/74 mmHg (08/30 0534) SpO2:  [94 %-98 %] 95 % (08/30 0534) Last BM Date: 09/08/13  Intake/Output from previous day: 08/29 0701 - 08/30 0700 In: 1260 [P.O.:1260] Out: -  Intake/Output this shift:    Incision/Wound:right breast shows minimal erythema non tender  Lab Results:   Recent Labs  09/10/13 0015 09/11/13 0015  WBC 5.1 5.3  HGB 9.4* 10.1*  HCT 27.5* 29.5*  PLT 211 230   BMET  Recent Labs  09/10/13 0015 09/11/13 0015  NA 142 139  K 3.9 3.7  CL 105 98  CO2 23 24  GLUCOSE 166* 132*  BUN 12 13  CREATININE 0.98 1.08  CALCIUM 9.0 9.5   PT/INR No results found for this basename: LABPROT, INR,  in the last 72 hours ABG No results found for this basename: PHART, PCO2, PO2, HCO3,  in the last 72 hours  Studies/Results: No results found.  Anti-infectives: Anti-infectives   Start     Dose/Rate Route Frequency Ordered Stop   09/09/13 1100  vancomycin (VANCOCIN) IVPB 1000 mg/200 mL premix     1,000 mg 200 mL/hr over 60 Minutes Intravenous 2 times daily 09/09/13 0959     09/07/13 1400  vancomycin (VANCOCIN) 1,500 mg in sodium chloride 0.9 % 500 mL IVPB  Status:  Discontinued     1,500 mg 250 mL/hr over 120 Minutes Intravenous Every 24 hours 09/06/13 1346 09/09/13 0959   09/06/13 1400  ceFEPIme (MAXIPIME) 1 g in dextrose 5 % 50 mL IVPB  Status:  Discontinued     1 g 100 mL/hr over 30 Minutes Intravenous Every 12 hours 09/06/13 1318 09/06/13 1347   09/06/13 1400  vancomycin (VANCOCIN) 1,500 mg in sodium chloride 0.9 % 500 mL IVPB     1,500 mg 250 mL/hr over 120 Minutes Intravenous  Once 09/06/13 1346 09/06/13 1912   09/06/13 1400  ceFEPIme (MAXIPIME) 1 g in dextrose 5 % 50 mL IVPB     1 g 100 mL/hr over  30 Minutes Intravenous 3 times per day 09/06/13 1347     09/06/13 1330  vancomycin (VANCOCIN) IVPB 1000 mg/200 mL premix  Status:  Discontinued     1,000 mg 200 mL/hr over 60 Minutes Intravenous Every 24 hours 09/06/13 1318 09/06/13 1346   09/06/13 1015  vancomycin (VANCOCIN) IVPB 1000 mg/200 mL premix     1,000 mg 200 mL/hr over 60 Minutes Intravenous  Once 09/06/13 1004 09/06/13 1155      Assessment/Plan: Cellulitis right breast Much better.  Will sign off. Follow up with me in 2 weeks.   LOS: 5 days    Lynnwood Beckford A. 09/11/2013

## 2013-09-12 ENCOUNTER — Ambulatory Visit: Payer: Medicare Other

## 2013-09-12 LAB — CULTURE, BLOOD (ROUTINE X 2)
CULTURE: NO GROWTH
Culture: NO GROWTH

## 2013-09-13 ENCOUNTER — Ambulatory Visit: Payer: Medicare Other | Admitting: Radiation Oncology

## 2013-09-13 ENCOUNTER — Ambulatory Visit: Payer: Medicare Other

## 2013-09-14 ENCOUNTER — Ambulatory Visit: Payer: Medicare Other

## 2013-09-15 ENCOUNTER — Ambulatory Visit: Payer: Medicare Other

## 2013-09-15 DIAGNOSIS — E1165 Type 2 diabetes mellitus with hyperglycemia: Secondary | ICD-10-CM | POA: Insufficient documentation

## 2013-09-16 ENCOUNTER — Ambulatory Visit: Payer: Medicare Other

## 2013-09-20 ENCOUNTER — Ambulatory Visit: Payer: Medicare Other

## 2013-09-20 ENCOUNTER — Ambulatory Visit: Payer: Medicare Other | Admitting: Radiation Oncology

## 2013-09-21 ENCOUNTER — Ambulatory Visit: Payer: Medicare Other

## 2013-09-22 ENCOUNTER — Encounter: Payer: Self-pay | Admitting: *Deleted

## 2013-09-22 ENCOUNTER — Ambulatory Visit: Payer: Medicare Other

## 2013-09-22 NOTE — Progress Notes (Unsigned)
09/22/13 at 1:18pm - B-51 study note - The research nurse called the pt on 09/20/13 to check on her status following her hospital discharge.  The pt said that she was scheduled to see her surgeon on 09/27/13 for a follow up visit.  The pt said that she was very confused as to why her radiation start was canceled for 09/05/13.  She said that she did not understand why she was contacted and told to not come back to radiation until she was contacted and told to return.  The pt said that she she was told that her "insurance did not approve of her radiation".  The research nurse informed the pt that Dr. Pablo Ledger told the research nurse that the "rapid review was not approved by 09/05/13, therefore, the pt's radiation start date was canceled".  The research nurse explained to the pt that the radiation team submitted her radiation treatment plan and was awaiting on confirmation.  It was later discovered that our site did not have to submit any treatment plan or await confirmation before proceeding with radiation.  However, the pt was hospitalized on 09/06/13 for cellulitis of the breast.  The pt was discharged from the hospital on 09/11/13.  Dr. Pablo Ledger was allowing the pt to heal from her cellulitis.  Dr. Pablo Ledger was informed that this pt's radiation must begin by 10/11/13 (within 12 weeks of the last breast cancer surgery).  Dr. Pablo Ledger informed the research nurse that she anticipated starting her radiation on September 16th and 17th.  The pt said that she was unsure when to come on the 16th or 17th.  The research nurse then contacted Patric Dykes, specialty manager for radiation nursing, and informed her that the pt needs to contacted about her appointments.  The pt also stated that she feels the radiation "tatoo" caused her cellulitis.  The research nurse shared this pt's perception with Malachy Mood.  Malachy Mood said that she would discuss this pt's situation with Dr. Pablo Ledger and then contact the pt about her treatment start  date.    09/23/13 at 3:27pm - The research nurse followed up with Patric Dykes to see if the pt had been contacted about her radiation.  Malachy Mood said that she asked Val, radiation nurse, to follow up with the pt.  The research nurse will continue to follow up to ensure that this pt is notified of her start date.    09/27/13 at 2:36pm- The pt was contacted by Nicholos Johns, radiation nurse, about her radiation plan on 09/26/13.  The pt was evaluated by her surgeon today.  Dr. Pablo Ledger emailed the research nurse after the pt's appointment with Dr. Brantley Stage.  It was determined that the pt needs an additional 7-10 days of antibiotics.  Therefore, her radiation start date has been moved to 10/06/13.  The research nurse also called Dr. Virgie Dad nurse, Ailene Ravel, and informed her that the pt is scheduled for labs and an infusion room appt.  The research nurse informed Ailene Ravel that the pt missed her last infusion due to her hospitalization.  The research nurse also told her that her surgeon has placed her on another course of antibiotics.  Ailene Ravel was asked to communicate the pt's current status with Dr. Jana Hakim.  It was felt that the pt might benefit from seeing a provider before her scheduled infusion.  The research nurse will follow up with the pt's schedule.    09/28/13 at 10:16am - The research nurse met with the pt and her daughter today.  The  pt is scheduled to see Dr. Virgie Dad advanced practice provider today to review the pt's current status before her infusion appointment.  The pt thanked the research nurse for helping her be seen today.  She said that she was worried about her treatment today since her breast is so tender.  The pt was told that her radiation will start when her doctors think that it is healed.  The pt was told that she can call the research nurse if she has any questions or concerns.    10/05/13 at 10:02am - The research nurse received a call from [facility], radiation nurse, that the pt  was seen this morning by Dr. Pablo Ledger to discuss the initiation of her radiation.  The pt was adamant that she wanted Dr. Brantley Stage to examine her breast and determine if she has healed fully from her cellulitis.  The pt did not want to start her radiation this week.  Dr. Pablo Ledger will contact Dr. Brantley Stage.to see if he will examine the pt on Monday, 10/10/13.  The pt must begin radiation within 12 weeks of her last surgery which is 10/11/13.  Will continue to follow this pt.

## 2013-09-23 ENCOUNTER — Ambulatory Visit: Payer: Medicare Other

## 2013-09-26 ENCOUNTER — Telehealth: Payer: Self-pay

## 2013-09-26 ENCOUNTER — Ambulatory Visit: Payer: Medicare Other

## 2013-09-26 NOTE — Telephone Encounter (Signed)
Called patient and informed her that I will call her back after she sees Dr.Cornett on Tuesday 09/27/13 at 11:00 am to instruct her on date and time to come in for radiation.Reassured her that I will do teaching and give products once she starts treatment.Patient thankful.

## 2013-09-27 ENCOUNTER — Telehealth: Payer: Self-pay

## 2013-09-27 ENCOUNTER — Ambulatory Visit: Payer: Medicare Other

## 2013-09-27 NOTE — Telephone Encounter (Signed)
Called [name] in medical oncologist to see if patient was still on for herceptin tomorrow and she said yes.she will contact patient to let her know to come in for scheduled appointments tomorrow.

## 2013-09-27 NOTE — Telephone Encounter (Signed)
Dr.Cornett has informed dr.Wentworth that patient will need 7 to 10 more days of anti-biotic therapy.Patient scheduled to go back to see surgeon on October 13, 2013.we have a tentative start date for 10/06/13 if patient is doing better but knows that we may have to change this start date.Patient given my name and number to call me prior to 10/05/13 to update me on status.

## 2013-09-28 ENCOUNTER — Other Ambulatory Visit: Payer: Medicare Other

## 2013-09-28 ENCOUNTER — Encounter: Payer: Self-pay | Admitting: Nurse Practitioner

## 2013-09-28 ENCOUNTER — Ambulatory Visit (HOSPITAL_BASED_OUTPATIENT_CLINIC_OR_DEPARTMENT_OTHER): Payer: Medicare Other

## 2013-09-28 ENCOUNTER — Other Ambulatory Visit (HOSPITAL_BASED_OUTPATIENT_CLINIC_OR_DEPARTMENT_OTHER): Payer: Medicare Other

## 2013-09-28 ENCOUNTER — Ambulatory Visit (HOSPITAL_BASED_OUTPATIENT_CLINIC_OR_DEPARTMENT_OTHER): Payer: Medicare Other | Admitting: Nurse Practitioner

## 2013-09-28 ENCOUNTER — Ambulatory Visit: Payer: Medicare Other

## 2013-09-28 VITALS — BP 144/84 | HR 76 | Temp 98.2°F | Resp 18 | Ht 64.5 in | Wt 252.7 lb

## 2013-09-28 DIAGNOSIS — C773 Secondary and unspecified malignant neoplasm of axilla and upper limb lymph nodes: Secondary | ICD-10-CM

## 2013-09-28 DIAGNOSIS — C50411 Malignant neoplasm of upper-outer quadrant of right female breast: Secondary | ICD-10-CM

## 2013-09-28 DIAGNOSIS — N61 Mastitis without abscess: Secondary | ICD-10-CM

## 2013-09-28 DIAGNOSIS — Z170 Estrogen receptor positive status [ER+]: Secondary | ICD-10-CM

## 2013-09-28 DIAGNOSIS — C50419 Malignant neoplasm of upper-outer quadrant of unspecified female breast: Secondary | ICD-10-CM

## 2013-09-28 DIAGNOSIS — Z5112 Encounter for antineoplastic immunotherapy: Secondary | ICD-10-CM

## 2013-09-28 LAB — COMPREHENSIVE METABOLIC PANEL (CC13)
ALBUMIN: 4 g/dL (ref 3.5–5.0)
ALT: 12 U/L (ref 0–55)
ANION GAP: 12 meq/L — AB (ref 3–11)
AST: 17 U/L (ref 5–34)
Alkaline Phosphatase: 90 U/L (ref 40–150)
BUN: 20.6 mg/dL (ref 7.0–26.0)
CALCIUM: 9.4 mg/dL (ref 8.4–10.4)
CHLORIDE: 107 meq/L (ref 98–109)
CO2: 24 meq/L (ref 22–29)
Creatinine: 1.3 mg/dL — ABNORMAL HIGH (ref 0.6–1.1)
Glucose: 156 mg/dl — ABNORMAL HIGH (ref 70–140)
POTASSIUM: 3.7 meq/L (ref 3.5–5.1)
SODIUM: 143 meq/L (ref 136–145)
TOTAL PROTEIN: 7.8 g/dL (ref 6.4–8.3)
Total Bilirubin: 0.53 mg/dL (ref 0.20–1.20)

## 2013-09-28 LAB — CBC WITH DIFFERENTIAL/PLATELET
BASO%: 0.7 % (ref 0.0–2.0)
Basophils Absolute: 0 10*3/uL (ref 0.0–0.1)
EOS%: 3.5 % (ref 0.0–7.0)
Eosinophils Absolute: 0.2 10*3/uL (ref 0.0–0.5)
HEMATOCRIT: 33.2 % — AB (ref 34.8–46.6)
HGB: 10.7 g/dL — ABNORMAL LOW (ref 11.6–15.9)
LYMPH#: 1.6 10*3/uL (ref 0.9–3.3)
LYMPH%: 33.2 % (ref 14.0–49.7)
MCH: 26.5 pg (ref 25.1–34.0)
MCHC: 32.1 g/dL (ref 31.5–36.0)
MCV: 82.5 fL (ref 79.5–101.0)
MONO#: 0.4 10*3/uL (ref 0.1–0.9)
MONO%: 7.6 % (ref 0.0–14.0)
NEUT#: 2.6 10*3/uL (ref 1.5–6.5)
NEUT%: 55 % (ref 38.4–76.8)
Platelets: 253 10*3/uL (ref 145–400)
RBC: 4.02 10*6/uL (ref 3.70–5.45)
RDW: 17.1 % — AB (ref 11.2–14.5)
WBC: 4.7 10*3/uL (ref 3.9–10.3)

## 2013-09-28 MED ORDER — SODIUM CHLORIDE 0.9 % IJ SOLN
10.0000 mL | INTRAMUSCULAR | Status: DC | PRN
  Administered 2013-09-28: 10 mL
  Filled 2013-09-28: qty 10

## 2013-09-28 MED ORDER — TRASTUZUMAB CHEMO INJECTION 440 MG
6.0000 mg/kg | Freq: Once | INTRAVENOUS | Status: AC
  Administered 2013-09-28: 672 mg via INTRAVENOUS
  Filled 2013-09-28: qty 32

## 2013-09-28 MED ORDER — DIPHENHYDRAMINE HCL 25 MG PO CAPS
ORAL_CAPSULE | ORAL | Status: AC
  Filled 2013-09-28: qty 1

## 2013-09-28 MED ORDER — SODIUM CHLORIDE 0.9 % IV SOLN
Freq: Once | INTRAVENOUS | Status: AC
  Administered 2013-09-28: 11:00:00 via INTRAVENOUS

## 2013-09-28 MED ORDER — ACETAMINOPHEN 325 MG PO TABS
ORAL_TABLET | ORAL | Status: AC
  Filled 2013-09-28: qty 2

## 2013-09-28 MED ORDER — HEPARIN SOD (PORK) LOCK FLUSH 100 UNIT/ML IV SOLN
500.0000 [IU] | Freq: Once | INTRAVENOUS | Status: AC | PRN
  Administered 2013-09-28: 500 [IU]
  Filled 2013-09-28: qty 5

## 2013-09-28 MED ORDER — DIPHENHYDRAMINE HCL 25 MG PO CAPS
25.0000 mg | ORAL_CAPSULE | Freq: Once | ORAL | Status: AC
  Administered 2013-09-28: 25 mg via ORAL

## 2013-09-28 MED ORDER — ACETAMINOPHEN 325 MG PO TABS
650.0000 mg | ORAL_TABLET | Freq: Once | ORAL | Status: AC
  Administered 2013-09-28: 650 mg via ORAL

## 2013-09-28 NOTE — Patient Instructions (Signed)
[facility] Discharge Instructions for Patients Receiving Chemotherapy  Today you received the following chemotherapy agents Hereptin.   To help prevent nausea and vomiting after your treatment, we encourage you to take your nausea medication as directed.    If you develop nausea and vomiting that is not controlled by your nausea medication, call the clinic.   BELOW ARE SYMPTOMS THAT SHOULD BE REPORTED IMMEDIATELY:  *FEVER GREATER THAN 100.5 F  *CHILLS WITH OR WITHOUT FEVER  NAUSEA AND VOMITING THAT IS NOT CONTROLLED WITH YOUR NAUSEA MEDICATION  *UNUSUAL SHORTNESS OF BREATH  *UNUSUAL BRUISING OR BLEEDING  TENDERNESS IN MOUTH AND THROAT WITH OR WITHOUT PRESENCE OF ULCERS  *URINARY PROBLEMS  *BOWEL PROBLEMS  UNUSUAL RASH Items with * indicate a potential emergency and should be followed up as soon as possible.  Feel free to call the clinic you have any questions or concerns. The clinic phone number is [phone].

## 2013-09-28 NOTE — Progress Notes (Signed)
[facility]  [phone] [phone]    ID: [name] OB: 05-12-1955  MR#: [id number]  JYN#:[id number]  PCP: Rubbie Battiest, MD GYN:   SU: Dr. Erroll Luna OTHER MD: Dr. Thea Silversmith  CHIEF COMPLAINT:  Locally advanced breast cancer TREATMENT: Receiving anti-HER-2 immunotherapy; to start radiation therapy  BREAST CANCER HISTORY: From Dr Dana Allan original intake note:  The patient herself noted a change in her right breast November 2014, but did not tell her family until after Christmas at the year. They "pretty much forced me" to have a mammogram and bilateral diagnostic mammography and right ultrasound 01/19/2013 at the breast Center showed an irregular mass associated with pleomorphic calcifications in the upper-outer quadrant of the right breast measuring 1.6 cm. There was an abnormal appearing right axillary lymph node. On physical exam, there was a movable firm palpable mass in the right breast measuring approximately 2 cm by palpation. Ultrasound showed this to be an irregularly marginated hypoechoic mass measuring 1.8 cm. In the right axilla the ultrasound showed a 1.3 cm abnormal appearing level I lymph node (loss a fatty hilum).  The 01/26/2013 the patient underwent biopsy of both the right breast mass in question and a suspicious right axillary lymph node. This showed ([id number]) both the breast mass and axillary lymph node 2 be positive for invasive ductal carcinoma, grade 2 or 3, estrogen receptor 99% positive, progesterone receptor 95% positive, both with strong staining intensity, with an MIB-1 of 70%, and HER-2 amplified at 3+.The patient was unable to undergo MRI because of claustrophobia concerns.   Her subsequent history is as detailed below.  INTERVAL HISTORY: Shantea returns to clinic today for follow up of her breast cancer. She was recently discharged from the hospital with a complicated case of right breast cellulitis  requiring IV antibiotics. She is finishing up a course of clindamycin and will be starting doxycycline this afternoon when she picks this new prescription up from pharmacy. She states that while the breast does look better, it is still very heavy and she rates her pain as 8/10 when the breast is manipulated. She takes percocet for the pain PRN. Today she is due for trastuzumab, and per the patient, Dr. Brantley Stage is ok with proceeding with this therapy. The start of her radiation is on hold on until the cellulitis resolves.   REVIEW OF SYSTEMS: Sarabeth denies fevers and chills. She is slightly nauseous with the antibiotics on board, but has been taking the doses with food diligently. She has no shortness of breath, chest pain, or fatigue. She has an occasional non-productive cough that is typical for her during the changing of the seasons. Her left knee is painful due to arthritis, but OTC meds handle this well. A detailed review of systems was otherwise noncontributory.  PAST MEDICAL HISTORY: Past Medical History  Diagnosis Date  . Hyperlipidemia   . Obesity   . Asthma     prn neb. and inhaler  . Osteoarthritis 2003    left knee  . Non-insulin dependent type 2 diabetes mellitus   . Graves' disease   . Ophthalmic manifestation of Graves disease   . Hypertension     on multiple meds., has been on med. > 14 yr.  . Dehydration 04/13/2013  . Breast cancer 01/2013    right  . Wears glasses   . GERD (gastroesophageal reflux disease)     PAST SURGICAL HISTORY: Past Surgical History  Procedure Laterality Date  . Total knee arthroplasty  Left 2003  . Total thyroidectomy    . Colonoscopy  2007  . Tubal ligation  1985  . Colonoscopy w/ polypectomy  2009  . Portacath placement Right 02/17/2013    Procedure: INSERTION PORT-A-CATH;  Surgeon: Joyice Faster. Cornett, MD;  Location: [facility];  Service: General;  Laterality: Right;    FAMILY HISTORY Family History  Problem Relation Age of  Onset  . Heart disease Father   . Lung cancer Father   . Diabetes Mother   . Diabetes Sister   . Hypertension Sister   . Asthma Sister    the patient's father died from lung cancer at the age of 99. The patient's mother died from thyroid cancer at the age of 85. The patient had 2 brothers, 2 sisters. There is no other history of breast or ovarian cancer in the family to her knowledge  GYN HISTORY: Menarche age 65, first live birth age 23. The patient is GX P2. She went through the change of life approximately age 14. She did not take hormone replacement.  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 She is now disabled secondary to her knee problems. She is divorced. At home she lives with her 2 daughters, [name] who works in direct supports to mentally retarded children, and [name], who is a group Games developer for mentally retarded children. The patient has no grandchildren. She attends a [employer]  ADVANCED DIRECTIVES: Not in place; these have been discussed with the patient, most recently in the 07/28/2013 visit.  HEALTH MAINTENANCE: History  Substance Use Topics  . Smoking status: Former Smoker -- 0.25 packs/day for 20 years    Quit date: 02/10/1991  . Smokeless tobacco: Never Used  . Alcohol Use: No      Allergies  Allergen Reactions  . Procardia [Nifedipine] Other (See Comments)    MIGRAINES    Current Outpatient Prescriptions  Medication Sig Dispense Refill  . acetaminophen (TYLENOL) 500 MG tablet Take 1,000 mg by mouth every 4 (four) hours as needed for moderate pain.      Marland Kitchen amLODipine (NORVASC) 10 MG tablet Take 10 mg by mouth daily.      . Artificial Tear Ointment (ARTIFICIAL TEARS) ointment Place 1 drop into both eyes as needed (for grey eye disease).       Marland Kitchen aspirin 81 MG tablet Take 81 mg by mouth daily.      . benazepril (LOTENSIN) 40 MG tablet       . calcium carbonate (TUMS EX) 750 MG  chewable tablet Chew 2 tablets by mouth 4 (four) times daily.      . cholecalciferol (VITAMIN D) 1000 UNITS tablet Take 1,000 Units by mouth daily.      . cloNIDine (CATAPRES - DOSED IN MG/24 HR) 0.2 mg/24hr patch Place 0.2 mg onto the skin once a week. Sunday/Mondays.      . cycloSPORINE (RESTASIS) 0.05 % ophthalmic emulsion Place 1 drop into both eyes 2 (two) times daily.       Marland Kitchen gabapentin (NEURONTIN) 100 MG capsule Take 1 capsule (100 mg total) by mouth 3 (three) times daily.  90 capsule  5  . hydrochlorothiazide (HYDRODIURIL) 25 MG tablet Take 25 mg by mouth daily.      Marland Kitchen ketoconazole (NIZORAL) 2 % cream Apply 1 application topically 2 (two) times daily.  60 g  0  . labetalol (NORMODYNE) 300 MG tablet Take 300 mg by mouth 2 (  two) times daily.      Marland Kitchen levothyroxine (SYNTHROID, LEVOTHROID) 125 MCG tablet Take 1 tablet (125 mcg total) by mouth daily before breakfast.  30 tablet  1  . LORazepam (ATIVAN) 0.5 MG tablet Take 1 tablet (0.5 mg total) by mouth every 8 (eight) hours.  30 tablet  0  . metFORMIN (GLUCOPHAGE) 500 MG tablet Take 500 mg by mouth 2 (two) times daily with a meal.      . oxyCODONE-acetaminophen (ROXICET) 5-325 MG per tablet Take 1-2 tablets by mouth every 4 (four) hours as needed.  30 tablet  0  . pravastatin (PRAVACHOL) 80 MG tablet Take 40 mg by mouth at bedtime.      . vitamin B-12 (CYANOCOBALAMIN) 500 MCG tablet Take 500 mcg by mouth daily.      Marland Kitchen albuterol (PROVENTIL HFA;VENTOLIN HFA) 108 (90 BASE) MCG/ACT inhaler Inhale 2 puffs into the lungs every 4 (four) hours as needed.  18 g  2  . albuterol (PROVENTIL) (2.5 MG/3ML) 0.083% nebulizer solution Take 3 mLs (2.5 mg total) by nebulization every 6 (six) hours as needed for wheezing or shortness of breath.  150 mL  0   No current facility-administered medications for this visit.    OBJECTIVE: Middle-aged [place] American woman in no acute distress   Filed Vitals:   09/28/13 0947  BP: 144/84  Pulse: 76  Temp: 98.2 F  (36.8 C)  Resp: 18     Body mass index is 42.72 kg/(m^2).      ECOG: 1  Skin: warm, dry  HEENT: sclerae anicteric, conjunctivae pink, oropharynx clear. No thrush or mucositis.  Lymph Nodes: No cervical or supraclavicular lymphadenopathy  Lungs: clear to auscultation bilaterally, no rales, wheezes, or rhonci  Heart: regular rate and rhythm  Abdomen: round, soft, non tender, positive bowel sounds  Musculoskeletal: No focal spinal tenderness, no peripheral edema  Neuro: non focal, well oriented, positive affect  Breasts: right breast edematous and displays mild erythema about 2in around the nipple when compared to the left breast. It is very tender. Left breast unremarkable.    LAB RESULTS:  CMP     Component Value Date/Time   NA 143 09/28/2013 0926   NA 139 09/11/2013 0015   K 3.7 09/28/2013 0926   K 3.7 09/11/2013 0015   CL 98 09/11/2013 0015   CO2 24 09/28/2013 0926   CO2 24 09/11/2013 0015   GLUCOSE 156* 09/28/2013 0926   GLUCOSE 132* 09/11/2013 0015   BUN 20.6 09/28/2013 0926   BUN 13 09/11/2013 0015   CREATININE 1.3* 09/28/2013 0926   CREATININE 1.08 09/11/2013 0015   CREATININE 1.28* 05/06/2013 1149   CALCIUM 9.4 09/28/2013 0926   CALCIUM 9.5 09/11/2013 0015   CALCIUM 6.4* 05/08/2013 1215   PROT 7.8 09/28/2013 0926   PROT 6.6 09/07/2013 0405   ALBUMIN 4.0 09/28/2013 0926   ALBUMIN 3.3* 09/07/2013 0405   AST 17 09/28/2013 0926   AST 21 09/07/2013 0405   ALT 12 09/28/2013 0926   ALT 12 09/07/2013 0405   ALKPHOS 90 09/28/2013 0926   ALKPHOS 83 09/07/2013 0405   BILITOT 0.53 09/28/2013 0926   BILITOT 0.5 09/07/2013 0405   GFRNONAA 55* 09/11/2013 0015   GFRAA 64* 09/11/2013 0015    I No results found for this basename: SPEP,  UPEP,   kappa and lambda light chains    Lab Results  Component Value Date   WBC 4.7 09/28/2013   NEUTROABS 2.6 09/28/2013   HGB 10.7* 09/28/2013  HCT 33.2* 09/28/2013   MCV 82.5 09/28/2013   PLT 253 09/28/2013      Chemistry      Component Value Date/Time    NA 143 09/28/2013 0926   NA 139 09/11/2013 0015   K 3.7 09/28/2013 0926   K 3.7 09/11/2013 0015   CL 98 09/11/2013 0015   CO2 24 09/28/2013 0926   CO2 24 09/11/2013 0015   BUN 20.6 09/28/2013 0926   BUN 13 09/11/2013 0015   CREATININE 1.3* 09/28/2013 0926   CREATININE 1.08 09/11/2013 0015   CREATININE 1.28* 05/06/2013 1149      Component Value Date/Time   CALCIUM 9.4 09/28/2013 0926   CALCIUM 9.5 09/11/2013 0015   CALCIUM 6.4* 05/08/2013 1215   ALKPHOS 90 09/28/2013 0926   ALKPHOS 83 09/07/2013 0405   AST 17 09/28/2013 0926   AST 21 09/07/2013 0405   ALT 12 09/28/2013 0926   ALT 12 09/07/2013 0405   BILITOT 0.53 09/28/2013 0926   BILITOT 0.5 09/07/2013 0405       No results found for this basename: LABCA2    No components found with this basename: LABCA125    No results found for this basename: INR,  in the last 168 hours  Urinalysis    Component Value Date/Time   COLORURINE YELLOW 09/06/2013 [place] 09/06/2013 1040   LABSPEC 1.015 09/06/2013 1040   PHURINE 5.0 09/06/2013 1040   GLUCOSEU NEGATIVE 09/06/2013 [place] 09/06/2013 [place] 09/06/2013 [place] 09/06/2013 1040   PROTEINUR NEGATIVE 09/06/2013 1040   UROBILINOGEN 0.2 09/06/2013 1040   NITRITE NEGATIVE 09/06/2013 1040   LEUKOCYTESUR NEGATIVE 09/06/2013 1040    STUDIES: Dg Chest 2 View  09/06/2013   CLINICAL DATA:  Sore throat.  Cough.  EXAM: CHEST  2 VIEW  COMPARISON:  05/02/2013.  FINDINGS: Part port catheter noted in good anatomic position. Surgical clips upper chest. Lungs are clear. Mild cardiomegaly. Pulmonary vascularity is normal. No pleural effusion or pneumothorax. Degenerative changes thoracic spine.  IMPRESSION: 1. No acute cardiopulmonary disease. Chest is stable from prior exam. 2. Power port catheter in good anatomic position. Surgical clips upper chest.   Electronically Signed   By: Marcello Moores  Register   On: 09/06/2013 10:21    ASSESSMENT: 58 y.o.  [place] woman   (1) status post right breast and right axillary lymph node biopsy 01/26/2013,  for a clinical T1c N1, stage IIA invasive ductal carcinoma, grade 3, estrogen and progesterone receptor positive, HER-2 amplified by immunohistochemistry (3+), with an MIB-1 of 70%.  (2) treated neo-adjuvantly with 4 of 6 planned cycles of docetaxel, carboplatin, trastuzumab,and pertuzumab, last dose 04/27/2013   (a) final 2 cycles of chemotherapy omitted in the face of multiple renal and electrolyte abnormalities    (b) pertuzumab and trastuzumab to be continued until definitive surgery  (c) trastuzumab alone being continued to complete one year of anti-HER-2 treatment  (d) echocardiograms every 3 months while on trastuzumab; most recent study 07/11/2013  (3) status post right lumpectomy and sentinel lymph node sampling 07/19/2013 showing a complete pathologic response (ypT0 ypN0)  (4) adjuvant radiation to follow surgery-- the patient is considering the B-51 study  (5) antiestrogen therapy to follow radiation  PLAN: The state of Yong's cellulitis is improving. As mentioned above she will start doxycyline this afternoon when she picks up the prescription. She will continue on the percocet for pain PRN. The labs were reviewed and were stable.  We will proceed with the trastuzumab today.   Sunjai will follow up with Dr. Brantley Stage again on 10/1. She will begin radiation thereafter with his approval. She will return to this clinic again on 10/7 for labs, an office visit, and the start of her next round of trastuzumab. Ricardo understands and agrees with this plan. She has been encouraged to call with any issues that might arise before her next visit here.   09/28/2013 10:54 AM Marcelino Duster, NP

## 2013-09-29 ENCOUNTER — Other Ambulatory Visit: Payer: Self-pay | Admitting: Oncology

## 2013-09-29 ENCOUNTER — Ambulatory Visit: Payer: Medicare Other

## 2013-09-30 ENCOUNTER — Ambulatory Visit: Payer: Medicare Other

## 2013-09-30 DIAGNOSIS — Z9221 Personal history of antineoplastic chemotherapy: Secondary | ICD-10-CM | POA: Diagnosis not present

## 2013-09-30 DIAGNOSIS — Z510 Encounter for antineoplastic radiation therapy: Secondary | ICD-10-CM | POA: Diagnosis present

## 2013-09-30 DIAGNOSIS — Z79899 Other long term (current) drug therapy: Secondary | ICD-10-CM | POA: Diagnosis not present

## 2013-09-30 DIAGNOSIS — C50419 Malignant neoplasm of upper-outer quadrant of unspecified female breast: Secondary | ICD-10-CM | POA: Diagnosis not present

## 2013-10-03 ENCOUNTER — Ambulatory Visit: Payer: Medicare Other

## 2013-10-03 ENCOUNTER — Telehealth: Payer: Self-pay

## 2013-10-03 NOTE — Telephone Encounter (Signed)
Spoke with patient and she prefers that her breast has healed on the antibiotic therapy and has been seen by the surgeon on October 13, 2013 before going forth with radiation.Patient states she feels tired today.Thankful for my follow up on how her skin is doing.There is some improvement.

## 2013-10-04 ENCOUNTER — Ambulatory Visit: Payer: Medicare Other

## 2013-10-04 ENCOUNTER — Telehealth: Payer: Self-pay

## 2013-10-04 NOTE — Telephone Encounter (Signed)
Patient has agreed to come in 10/05/13 at 8:45 am to see Dr.Wentworth for skin assessment and discuss starting treatment.

## 2013-10-04 NOTE — Telephone Encounter (Signed)
[name]-  It looks like she will not start until after 3 months post surgery.  Can you contact the study? Do we need to go off study?  SW

## 2013-10-05 ENCOUNTER — Ambulatory Visit
Admission: RE | Admit: 2013-10-05 | Discharge: 2013-10-05 | Disposition: A | Discharge status: Home / Self Care | Payer: Medicare Other | Source: Ambulatory Visit | Attending: Radiation Oncology | Admitting: Radiation Oncology

## 2013-10-05 ENCOUNTER — Ambulatory Visit
Admission: RE | Admit: 2013-10-05 | Discharge: 2013-10-05 | Disposition: A | Discharge status: Home / Self Care | Payer: Medicare Other | Source: Ambulatory Visit

## 2013-10-05 VITALS — BP 157/76 | HR 66 | Temp 98.1°F | Wt 254.9 lb

## 2013-10-05 DIAGNOSIS — C50411 Malignant neoplasm of upper-outer quadrant of right female breast: Secondary | ICD-10-CM

## 2013-10-05 DIAGNOSIS — Z510 Encounter for antineoplastic radiation therapy: Secondary | ICD-10-CM | POA: Diagnosis not present

## 2013-10-05 MED ORDER — RADIAPLEXRX EX GEL
Freq: Once | CUTANEOUS | Status: AC
  Administered 2013-10-05: 10:00:00 via TOPICAL

## 2013-10-05 MED ORDER — ALRA NON-METALLIC DEODORANT (RAD-ONC)
1.0000 "application " | Freq: Once | TOPICAL | Status: AC
  Administered 2013-10-05: 1 via TOPICAL

## 2013-10-05 NOTE — Progress Notes (Signed)
Patient for skin assessment after starting anti-biotic therapy on Saturday 10/01/13.Right breast with small area of mild red pigment.Patient states improvement  in pain and softening of tissue.Dr.Wentworth to call Dr.Cornette to see if he can see patient on Monday 9/28/8 to release her to start radiation treatment by 09/21/13 per research protocol.Patient education performed with patient and daughter [name].Given Radiation Therapy and You Booklet, skin care sheet, alra deodorant and radiaplex.reviewed possible side effects to include skin discoloration, tenderness and swelling as well as fatigue.Application of gel twice daily but not 3 to 4 hours prior to treatment, no underwire bra.Informed that she will be assessed and seen by doctor weekly on Tuesday after radiation to  allow 30 to 45 minutes for clinic time.Nikki RN informed of status.I will keep patient, therapists on linac 2 and Nikki informed when patient is a go.

## 2013-10-06 ENCOUNTER — Ambulatory Visit: Payer: Medicare Other

## 2013-10-07 ENCOUNTER — Ambulatory Visit: Payer: Medicare Other

## 2013-10-10 ENCOUNTER — Ambulatory Visit
Admission: RE | Admit: 2013-10-10 | Discharge: 2013-10-10 | Disposition: A | Discharge status: Home / Self Care | Payer: Medicare Other | Source: Ambulatory Visit | Attending: Radiation Oncology | Admitting: Radiation Oncology

## 2013-10-10 ENCOUNTER — Telehealth: Payer: Self-pay

## 2013-10-10 DIAGNOSIS — Z510 Encounter for antineoplastic radiation therapy: Secondary | ICD-10-CM | POA: Diagnosis not present

## 2013-10-10 NOTE — Telephone Encounter (Signed)
Spoke with patient via phone and explained importance of getting started now as to stay on target with research protocol.States breast is better.She will come in for port film at 1:30 pm today and start treatment at 9:15 am tomorrow.

## 2013-10-11 ENCOUNTER — Ambulatory Visit: Payer: Medicare Other

## 2013-10-11 ENCOUNTER — Ambulatory Visit
Admission: RE | Admit: 2013-10-11 | Discharge: 2013-10-11 | Disposition: A | Discharge status: Home / Self Care | Payer: Medicare Other | Source: Ambulatory Visit | Attending: Radiation Oncology | Admitting: Radiation Oncology

## 2013-10-11 VITALS — BP 146/74 | HR 68 | Temp 98.8°F | Wt 253.6 lb

## 2013-10-11 DIAGNOSIS — Z510 Encounter for antineoplastic radiation therapy: Secondary | ICD-10-CM | POA: Diagnosis not present

## 2013-10-11 DIAGNOSIS — C50411 Malignant neoplasm of upper-outer quadrant of right female breast: Secondary | ICD-10-CM

## 2013-10-11 NOTE — Progress Notes (Signed)
Weekly Management Note Current Dose:2   Gy  Projected Dose:60  Gy   Narrative:  The patient presents for routine under treatment assessment.  CBCT/MVCT images/Port film x-rays were reviewed.  The chart was checked. Doing well. No complaints.   Physical Findings: Weight: 253 lb 9.6 oz (115.032 kg). Unchanged. Breast is not pink or hot. Nipple looks normal.   Impression:  The patient is tolerating radiation.  Plan:  Continue treatment as planned. Start radiaplex.

## 2013-10-11 NOTE — Progress Notes (Signed)
Weekly assessment of radiation to right breast.Day one.Completed anti-biotic therapy for right breast infection.no longer red and increased softening.Knows to start radiaplex twice daily and shown to apply with in lines of treatment field.Denies pain.Patient in good mood, very positive, knows she may call or come by nursing if she has any questions.

## 2013-10-12 ENCOUNTER — Ambulatory Visit: Payer: Medicare Other

## 2013-10-12 ENCOUNTER — Ambulatory Visit
Admission: RE | Admit: 2013-10-12 | Discharge: 2013-10-12 | Disposition: A | Discharge status: Home / Self Care | Payer: Medicare Other | Source: Ambulatory Visit | Attending: Radiation Oncology | Admitting: Radiation Oncology

## 2013-10-12 ENCOUNTER — Ambulatory Visit (HOSPITAL_COMMUNITY)
Admission: RE | Admit: 2013-10-12 | Discharge: 2013-10-12 | Disposition: A | Discharge status: Home / Self Care | Payer: Medicare Other | Source: Ambulatory Visit | Attending: Oncology | Admitting: Oncology

## 2013-10-12 DIAGNOSIS — F17200 Nicotine dependence, unspecified, uncomplicated: Secondary | ICD-10-CM | POA: Diagnosis not present

## 2013-10-12 DIAGNOSIS — N179 Acute kidney failure, unspecified: Secondary | ICD-10-CM | POA: Insufficient documentation

## 2013-10-12 DIAGNOSIS — C50419 Malignant neoplasm of upper-outer quadrant of unspecified female breast: Secondary | ICD-10-CM | POA: Diagnosis not present

## 2013-10-12 DIAGNOSIS — E669 Obesity, unspecified: Secondary | ICD-10-CM | POA: Diagnosis not present

## 2013-10-12 DIAGNOSIS — Z6841 Body Mass Index (BMI) 40.0 and over, adult: Secondary | ICD-10-CM | POA: Insufficient documentation

## 2013-10-12 DIAGNOSIS — J45909 Unspecified asthma, uncomplicated: Secondary | ICD-10-CM | POA: Diagnosis not present

## 2013-10-12 DIAGNOSIS — I10 Essential (primary) hypertension: Secondary | ICD-10-CM | POA: Diagnosis not present

## 2013-10-12 DIAGNOSIS — Z510 Encounter for antineoplastic radiation therapy: Secondary | ICD-10-CM | POA: Diagnosis not present

## 2013-10-12 DIAGNOSIS — I517 Cardiomegaly: Secondary | ICD-10-CM

## 2013-10-12 DIAGNOSIS — E785 Hyperlipidemia, unspecified: Secondary | ICD-10-CM | POA: Diagnosis not present

## 2013-10-12 DIAGNOSIS — E119 Type 2 diabetes mellitus without complications: Secondary | ICD-10-CM | POA: Insufficient documentation

## 2013-10-12 DIAGNOSIS — E0590 Thyrotoxicosis, unspecified without thyrotoxic crisis or storm: Secondary | ICD-10-CM | POA: Diagnosis not present

## 2013-10-12 DIAGNOSIS — C50411 Malignant neoplasm of upper-outer quadrant of right female breast: Secondary | ICD-10-CM

## 2013-10-12 NOTE — Progress Notes (Signed)
  Echocardiogram 2D Echocardiogram has been performed.  [name] 10/12/2013, 12:19 PM

## 2013-10-13 ENCOUNTER — Ambulatory Visit
Admission: RE | Admit: 2013-10-13 | Discharge: 2013-10-13 | Disposition: A | Discharge status: Home / Self Care | Payer: Medicare Other | Source: Ambulatory Visit | Attending: Radiation Oncology | Admitting: Radiation Oncology

## 2013-10-13 DIAGNOSIS — Z510 Encounter for antineoplastic radiation therapy: Secondary | ICD-10-CM | POA: Insufficient documentation

## 2013-10-13 DIAGNOSIS — C50411 Malignant neoplasm of upper-outer quadrant of right female breast: Secondary | ICD-10-CM | POA: Insufficient documentation

## 2013-10-14 ENCOUNTER — Ambulatory Visit
Admission: RE | Admit: 2013-10-14 | Discharge: 2013-10-14 | Disposition: A | Discharge status: Home / Self Care | Payer: Medicare Other | Source: Ambulatory Visit | Attending: Radiation Oncology | Admitting: Radiation Oncology

## 2013-10-14 DIAGNOSIS — Z510 Encounter for antineoplastic radiation therapy: Secondary | ICD-10-CM | POA: Diagnosis not present

## 2013-10-17 ENCOUNTER — Ambulatory Visit
Admission: RE | Admit: 2013-10-17 | Discharge: 2013-10-17 | Disposition: A | Discharge status: Home / Self Care | Payer: Medicare Other | Source: Ambulatory Visit | Attending: Radiation Oncology | Admitting: Radiation Oncology

## 2013-10-17 DIAGNOSIS — Z510 Encounter for antineoplastic radiation therapy: Secondary | ICD-10-CM | POA: Diagnosis not present

## 2013-10-18 ENCOUNTER — Ambulatory Visit
Admission: RE | Admit: 2013-10-18 | Discharge: 2013-10-18 | Disposition: A | Discharge status: Home / Self Care | Payer: Medicare Other | Source: Ambulatory Visit | Attending: Radiation Oncology | Admitting: Radiation Oncology

## 2013-10-18 ENCOUNTER — Ambulatory Visit: Payer: Medicare Other

## 2013-10-18 DIAGNOSIS — C50411 Malignant neoplasm of upper-outer quadrant of right female breast: Secondary | ICD-10-CM

## 2013-10-18 DIAGNOSIS — Z510 Encounter for antineoplastic radiation therapy: Secondary | ICD-10-CM | POA: Diagnosis not present

## 2013-10-18 NOTE — Progress Notes (Signed)
Weekly Management Note Current Dose: 12  Gy  Projected Dose: 50 Gy   Narrative:  The patient presents for routine under treatment assessment.  CBCT/MVCT images/Port film x-rays were reviewed.  The chart was checked. Doing well. Red rash on abdomen. No pain. Not itching.   Physical Findings: No skin changes. Red bumps on abdomen.   Impression:  The patient is tolerating radiation.  Plan:  Continue treatment as planned. ? Zoster. Monitor.

## 2013-10-19 ENCOUNTER — Ambulatory Visit (HOSPITAL_BASED_OUTPATIENT_CLINIC_OR_DEPARTMENT_OTHER): Payer: Medicare Other | Admitting: Nurse Practitioner

## 2013-10-19 ENCOUNTER — Telehealth: Payer: Self-pay | Admitting: *Deleted

## 2013-10-19 ENCOUNTER — Ambulatory Visit
Admission: RE | Admit: 2013-10-19 | Discharge: 2013-10-19 | Disposition: A | Discharge status: Home / Self Care | Payer: Medicare Other | Source: Ambulatory Visit | Attending: Radiation Oncology | Admitting: Radiation Oncology

## 2013-10-19 ENCOUNTER — Ambulatory Visit: Payer: Medicare Other

## 2013-10-19 ENCOUNTER — Encounter: Payer: Self-pay | Admitting: Nurse Practitioner

## 2013-10-19 ENCOUNTER — Telehealth: Payer: Self-pay | Admitting: Nurse Practitioner

## 2013-10-19 ENCOUNTER — Ambulatory Visit (HOSPITAL_BASED_OUTPATIENT_CLINIC_OR_DEPARTMENT_OTHER): Payer: Medicare Other

## 2013-10-19 ENCOUNTER — Other Ambulatory Visit (HOSPITAL_BASED_OUTPATIENT_CLINIC_OR_DEPARTMENT_OTHER): Payer: Medicare Other

## 2013-10-19 VITALS — BP 148/77 | HR 85 | Temp 99.1°F | Resp 20 | Ht 64.5 in | Wt 254.2 lb

## 2013-10-19 DIAGNOSIS — Z5112 Encounter for antineoplastic immunotherapy: Secondary | ICD-10-CM

## 2013-10-19 DIAGNOSIS — C50411 Malignant neoplasm of upper-outer quadrant of right female breast: Secondary | ICD-10-CM

## 2013-10-19 DIAGNOSIS — R5383 Other fatigue: Secondary | ICD-10-CM

## 2013-10-19 DIAGNOSIS — Z510 Encounter for antineoplastic radiation therapy: Secondary | ICD-10-CM | POA: Diagnosis not present

## 2013-10-19 DIAGNOSIS — Z170 Estrogen receptor positive status [ER+]: Secondary | ICD-10-CM

## 2013-10-19 DIAGNOSIS — C773 Secondary and unspecified malignant neoplasm of axilla and upper limb lymph nodes: Secondary | ICD-10-CM

## 2013-10-19 DIAGNOSIS — R0981 Nasal congestion: Secondary | ICD-10-CM | POA: Insufficient documentation

## 2013-10-19 LAB — COMPREHENSIVE METABOLIC PANEL (CC13)
ALBUMIN: 3.8 g/dL (ref 3.5–5.0)
ALT: 13 U/L (ref 0–55)
AST: 16 U/L (ref 5–34)
Alkaline Phosphatase: 85 U/L (ref 40–150)
Anion Gap: 10 mEq/L (ref 3–11)
BUN: 17.6 mg/dL (ref 7.0–26.0)
CALCIUM: 9.6 mg/dL (ref 8.4–10.4)
CHLORIDE: 106 meq/L (ref 98–109)
CO2: 28 mEq/L (ref 22–29)
CREATININE: 1.2 mg/dL — AB (ref 0.6–1.1)
Glucose: 155 mg/dl — ABNORMAL HIGH (ref 70–140)
POTASSIUM: 4 meq/L (ref 3.5–5.1)
Sodium: 144 mEq/L (ref 136–145)
Total Bilirubin: 0.44 mg/dL (ref 0.20–1.20)
Total Protein: 7.2 g/dL (ref 6.4–8.3)

## 2013-10-19 LAB — CBC WITH DIFFERENTIAL/PLATELET
BASO%: 0.3 % (ref 0.0–2.0)
Basophils Absolute: 0 10*3/uL (ref 0.0–0.1)
EOS%: 2.6 % (ref 0.0–7.0)
Eosinophils Absolute: 0.1 10*3/uL (ref 0.0–0.5)
HCT: 33.9 % — ABNORMAL LOW (ref 34.8–46.6)
HGB: 10.8 g/dL — ABNORMAL LOW (ref 11.6–15.9)
LYMPH#: 1 10*3/uL (ref 0.9–3.3)
LYMPH%: 18.7 % (ref 14.0–49.7)
MCH: 26.5 pg (ref 25.1–34.0)
MCHC: 31.9 g/dL (ref 31.5–36.0)
MCV: 83.1 fL (ref 79.5–101.0)
MONO#: 0.4 10*3/uL (ref 0.1–0.9)
MONO%: 7.8 % (ref 0.0–14.0)
NEUT#: 3.8 10*3/uL (ref 1.5–6.5)
NEUT%: 70.6 % (ref 38.4–76.8)
Platelets: 199 10*3/uL (ref 145–400)
RBC: 4.08 10*6/uL (ref 3.70–5.45)
RDW: 17.5 % — ABNORMAL HIGH (ref 11.2–14.5)
WBC: 5.3 10*3/uL (ref 3.9–10.3)

## 2013-10-19 MED ORDER — TRASTUZUMAB CHEMO INJECTION 440 MG
6.0000 mg/kg | Freq: Once | INTRAVENOUS | Status: AC
  Administered 2013-10-19: 672 mg via INTRAVENOUS
  Filled 2013-10-19: qty 32

## 2013-10-19 MED ORDER — DIPHENHYDRAMINE HCL 25 MG PO CAPS: 25.0000 mg | ORAL_CAPSULE | Freq: Once | ORAL | Status: DC

## 2013-10-19 MED ORDER — DIPHENHYDRAMINE HCL 25 MG PO CAPS
25.0000 mg | ORAL_CAPSULE | Freq: Once | ORAL | Status: AC
  Administered 2013-10-19: 25 mg via ORAL

## 2013-10-19 MED ORDER — DIPHENHYDRAMINE HCL 25 MG PO CAPS
ORAL_CAPSULE | ORAL | Status: AC
  Filled 2013-10-19: qty 1

## 2013-10-19 MED ORDER — SODIUM CHLORIDE 0.9 % IV SOLN: Freq: Once | INTRAVENOUS | Status: DC

## 2013-10-19 MED ORDER — ACETAMINOPHEN 325 MG PO TABS: 650.0000 mg | ORAL_TABLET | Freq: Once | ORAL | Status: DC

## 2013-10-19 MED ORDER — SODIUM CHLORIDE 0.9 % IJ SOLN
10.0000 mL | INTRAMUSCULAR | Status: DC | PRN
  Administered 2013-10-19: 10 mL
  Filled 2013-10-19: qty 10

## 2013-10-19 MED ORDER — HEPARIN SOD (PORK) LOCK FLUSH 100 UNIT/ML IV SOLN
500.0000 [IU] | Freq: Once | INTRAVENOUS | Status: AC | PRN
  Administered 2013-10-19: 500 [IU]
  Filled 2013-10-19: qty 5

## 2013-10-19 MED ORDER — SODIUM CHLORIDE 0.9 % IJ SOLN
10.0000 mL | INTRAMUSCULAR | Status: DC | PRN
  Filled 2013-10-19: qty 10

## 2013-10-19 MED ORDER — ACETAMINOPHEN 325 MG PO TABS
650.0000 mg | ORAL_TABLET | Freq: Once | ORAL | Status: AC
  Administered 2013-10-19: 650 mg via ORAL

## 2013-10-19 MED ORDER — ACETAMINOPHEN 325 MG PO TABS
ORAL_TABLET | ORAL | Status: AC
  Filled 2013-10-19: qty 2

## 2013-10-19 MED ORDER — TRASTUZUMAB CHEMO INJECTION 440 MG: 6.0000 mg/kg | Freq: Once | INTRAVENOUS | Status: DC

## 2013-10-19 MED ORDER — SODIUM CHLORIDE 0.9 % IV SOLN
Freq: Once | INTRAVENOUS | Status: AC
  Administered 2013-10-19: 11:00:00 via INTRAVENOUS

## 2013-10-19 MED ORDER — HEPARIN SOD (PORK) LOCK FLUSH 100 UNIT/ML IV SOLN
500.0000 [IU] | Freq: Once | INTRAVENOUS | Status: DC | PRN
  Filled 2013-10-19: qty 5

## 2013-10-19 NOTE — Patient Instructions (Signed)
[facility] Discharge Instructions for Patients Receiving Chemotherapy  Today you received the following chemotherapy agents Herceptin  To help prevent nausea and vomiting after your treatment, we encourage you to take your nausea medication     If you develop nausea and vomiting that is not controlled by your nausea medication, call the clinic.   BELOW ARE SYMPTOMS THAT SHOULD BE REPORTED IMMEDIATELY:  *FEVER GREATER THAN 100.5 F  *CHILLS WITH OR WITHOUT FEVER  NAUSEA AND VOMITING THAT IS NOT CONTROLLED WITH YOUR NAUSEA MEDICATION  *UNUSUAL SHORTNESS OF BREATH  *UNUSUAL BRUISING OR BLEEDING  TENDERNESS IN MOUTH AND THROAT WITH OR WITHOUT PRESENCE OF ULCERS  *URINARY PROBLEMS  *BOWEL PROBLEMS  UNUSUAL RASH Items with * indicate a potential emergency and should be followed up as soon as possible.  Feel free to call the clinic you have any questions or concerns. The clinic phone number is [phone].    

## 2013-10-19 NOTE — Progress Notes (Signed)
[place]  [phone] [phone]    ID: [name] OB: 1955/05/29  MR#: [id number]  ONG#:[id number]  PCP: Lacey Battiest, MD GYN:   SU: Dr. Erroll Jackson OTHER MD: Dr. Thea Jackson  CHIEF COMPLAINT:  Locally advanced breast cancer TREATMENT: Receiving anti-HER-2 immunotherapy; to start radiation therapy  BREAST CANCER HISTORY: From Dr [name] original intake note:  The patient herself noted a change in her right breast November 2014, but did not tell her family until after Christmas at the year. They "pretty much forced me" to have a mammogram and bilateral diagnostic mammography and right ultrasound 01/19/2013 at the breast Center showed an irregular mass associated with pleomorphic calcifications in the upper-outer quadrant of the right breast measuring 1.6 cm. There was an abnormal appearing right axillary lymph node. On physical exam, there was a movable firm palpable mass in the right breast measuring approximately 2 cm by palpation. Ultrasound showed this to be an irregularly marginated hypoechoic mass measuring 1.8 cm. In the right axilla the ultrasound showed a 1.3 cm abnormal appearing level I lymph node (loss a fatty hilum).  The 01/26/2013 the patient underwent biopsy of both the right breast mass in question and a suspicious right axillary lymph node. 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   Her subsequent history is as detailed below.  INTERVAL HISTORY: [name] returns to clinic today for follow up of her breast cancer, accompanied by her daughter [name]. Today she is due for trastuzumab. Her right breast is healing from  a complicated case of cellulitis, and she has begun radiation. She is tolerating these treatments well, with her main complaint being fatigue. She no longer has acute breast pain, and thus has been able to stop using opioids for pain control.   REVIEW OF SYSTEMS: [name] denies fevers, chills, nausea, vomiting, or changes in bowel or bladder habits. Appetite is strong. The numbness to her fingertips is improving. She has some shortness of breath with exertion, and uses a rolling walker for steadiness. She has no chest pain, palpitations, cough, or fatigue. She is having some sinus symptoms and believes to be catching a cold that her daughter had a week ago. She takes OTC meds for her left knee arthritis. A detailed review of systems is otherwise noncontributory.  PAST MEDICAL HISTORY: Past Medical History  Diagnosis Date  . Hyperlipidemia   . Obesity   . Asthma     prn neb. and inhaler  . Osteoarthritis 2003    left knee  . Non-insulin dependent type 2 diabetes mellitus   . Graves' disease   . Ophthalmic manifestation of Graves disease   . Hypertension     on multiple meds., has been on med. > 14 yr.  . Dehydration 04/13/2013  . Breast cancer 01/2013    right  . Wears glasses   . GERD (gastroesophageal reflux disease)     PAST SURGICAL HISTORY: Past Surgical History  Procedure Laterality Date  . Total knee arthroplasty Left 2003  . Total thyroidectomy    . Colonoscopy  2007  . Tubal ligation  1985  . Colonoscopy w/ polypectomy  2009  . Portacath placement Right 02/17/2013    Procedure: INSERTION  PORT-A-CATH;  Surgeon: Lacey Faster. Cornett, MD;  Location: [place] [facility];  Service: General;  Laterality: Right;    FAMILY HISTORY Family History  Problem Relation Age of Onset  . Heart disease Father   . Lung cancer Father   . Diabetes Mother   . Diabetes Sister   . Hypertension Sister   . Asthma Sister    the patient's father died from lung cancer at the age of 58. The  patient's mother died from thyroid cancer at the age of 55. The patient had 2 brothers, 2 sisters. There is no other history of breast or ovarian cancer in the family to her knowledge  GYN HISTORY: Menarche age 36, first live birth age 58. The patient is GX P2. She went through the change of life approximately age 89. She did not take hormone replacement.  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 She is now disabled secondary to her knee problems. She is divorced. At home she lives with her 2 daughters, [name] who works in direct supports to mentally retarded children, and [name], who is a group Games developer for mentally retarded children. The patient has no grandchildren. She attends a [employer]  ADVANCED DIRECTIVES: Not in place; these have been discussed with the patient, most recently in the 07/28/2013 visit.  HEALTH MAINTENANCE: History  Substance Use Topics  . Smoking status: Former Smoker -- 0.25 packs/day for 20 years    Quit date: 02/10/1991  . Smokeless tobacco: Never Used  . Alcohol Use: No      Allergies  Allergen Reactions  . Procardia [Nifedipine] Other (See Comments)    MIGRAINES    Current Outpatient Prescriptions  Medication Sig Dispense Refill  . acetaminophen (TYLENOL) 500 MG tablet Take 1,000 mg by mouth every 4 (four) hours as needed for moderate pain.      Marland Kitchen amLODipine (NORVASC) 10 MG tablet Take 10 mg by mouth daily.      . Artificial Tear Ointment (ARTIFICIAL TEARS) ointment Place 1 drop into both eyes as needed (for grey eye disease).       Marland Kitchen aspirin 81 MG tablet Take 81 mg by mouth daily.      . benazepril (LOTENSIN) 40 MG tablet       . calcium carbonate (TUMS EX) 750 MG chewable tablet Chew 2 tablets by mouth 4 (four) times daily.      . cholecalciferol (VITAMIN D) 1000 UNITS tablet Take 1,000 Units by mouth daily.      . cloNIDine (CATAPRES - DOSED IN MG/24 HR) 0.2 mg/24hr  patch Place 0.2 mg onto the skin once a week. Sunday/Mondays.      . cycloSPORINE (RESTASIS) 0.05 % ophthalmic emulsion Place 1 drop into both eyes 2 (two) times daily.       Marland Kitchen gabapentin (NEURONTIN) 100 MG capsule Take 1 capsule (100 mg total) by mouth 3 (three) times daily.  90 capsule  5  . hydrochlorothiazide (HYDRODIURIL) 25 MG tablet Take 25 mg by mouth daily.      Marland Kitchen labetalol (NORMODYNE) 300 MG tablet Take 300 mg by mouth 2 (two) times daily.      Marland Kitchen levothyroxine (SYNTHROID, LEVOTHROID) 125 MCG tablet Take 1 tablet (125 mcg total) by mouth daily before breakfast.  30 tablet  1  . LORazepam (ATIVAN) 0.5 MG tablet Take 1 tablet (0.5 mg total) by mouth every 8 (eight) hours.  30 tablet  0  .  metFORMIN (GLUCOPHAGE) 500 MG tablet Take 500 mg by mouth 2 (two) times daily with a meal.      . oxyCODONE-acetaminophen (ROXICET) 5-325 MG per tablet Take 1-2 tablets by mouth every 4 (four) hours as needed.  30 tablet  0  . pravastatin (PRAVACHOL) 80 MG tablet Take 40 mg by mouth at bedtime.      . vitamin B-12 (CYANOCOBALAMIN) 500 MCG tablet Take 500 mcg by mouth daily.      Marland Kitchen albuterol (PROVENTIL HFA;VENTOLIN HFA) 108 (90 BASE) MCG/ACT inhaler Inhale 2 puffs into the lungs every 4 (four) hours as needed.  18 g  2  . albuterol (PROVENTIL) (2.5 MG/3ML) 0.083% nebulizer solution Take 3 mLs (2.5 mg total) by nebulization every 6 (six) hours as needed for wheezing or shortness of breath.  150 mL  0  . ketoconazole (NIZORAL) 2 % cream Apply 1 application topically 2 (two) times daily.  60 g  0   No current facility-administered medications for this visit.    OBJECTIVE: Middle-aged [place] American woman in no acute distress   Filed Vitals:   10/19/13 0928  BP: 148/77  Pulse: 85  Temp: 99.1 F (37.3 C)  Resp: 20     Body mass index is 42.98 kg/(m^2).      ECOG: 1  Sclerae unicteric, pupils equal and reactive Oropharynx clear and moist-- no thrush No cervical or supraclavicular  adenopathy Lungs no rales or rhonchi Heart regular rate and rhythm Abd soft, nontender, positive bowel sounds MSK no focal spinal tenderness, no upper extremity lymphedema Neuro: nonfocal, well oriented, appropriate affect Breasts: deferred  LAB RESULTS:  CMP     Component Value Date/Time   NA 144 10/19/2013 0900   NA 139 09/11/2013 0015   K 4.0 10/19/2013 0900   K 3.7 09/11/2013 0015   CL 98 09/11/2013 0015   CO2 28 10/19/2013 0900   CO2 24 09/11/2013 0015   GLUCOSE 155* 10/19/2013 0900   GLUCOSE 132* 09/11/2013 0015   BUN 17.6 10/19/2013 0900   BUN 13 09/11/2013 0015   CREATININE 1.2* 10/19/2013 0900   CREATININE 1.08 09/11/2013 0015   CREATININE 1.28* 05/06/2013 1149   CALCIUM 9.6 10/19/2013 0900   CALCIUM 9.5 09/11/2013 0015   CALCIUM 6.4* 05/08/2013 1215   PROT 7.2 10/19/2013 0900   PROT 6.6 09/07/2013 0405   ALBUMIN 3.8 10/19/2013 0900   ALBUMIN 3.3* 09/07/2013 0405   AST 16 10/19/2013 0900   AST 21 09/07/2013 0405   ALT 13 10/19/2013 0900   ALT 12 09/07/2013 0405   ALKPHOS 85 10/19/2013 0900   ALKPHOS 83 09/07/2013 0405   BILITOT 0.44 10/19/2013 0900   BILITOT 0.5 09/07/2013 0405   GFRNONAA 55* 09/11/2013 0015   GFRAA 64* 09/11/2013 0015    I No results found for this basename: SPEP,  UPEP,   kappa and lambda light chains    Lab Results  Component Value Date   WBC 5.3 10/19/2013   NEUTROABS 3.8 10/19/2013   HGB 10.8* 10/19/2013   HCT 33.9* 10/19/2013   MCV 83.1 10/19/2013   PLT 199 10/19/2013      Chemistry      Component Value Date/Time   NA 144 10/19/2013 0900   NA 139 09/11/2013 0015   K 4.0 10/19/2013 0900   K 3.7 09/11/2013 0015   CL 98 09/11/2013 0015   CO2 28 10/19/2013 0900   CO2 24 09/11/2013 0015   BUN 17.6 10/19/2013 0900   BUN 13 09/11/2013  0015   CREATININE 1.2* 10/19/2013 0900   CREATININE 1.08 09/11/2013 0015   CREATININE 1.28* 05/06/2013 1149      Component Value Date/Time   CALCIUM 9.6 10/19/2013 0900   CALCIUM 9.5 09/11/2013 0015   CALCIUM 6.4* 05/08/2013 1215    ALKPHOS 85 10/19/2013 0900   ALKPHOS 83 09/07/2013 0405   AST 16 10/19/2013 0900   AST 21 09/07/2013 0405   ALT 13 10/19/2013 0900   ALT 12 09/07/2013 0405   BILITOT 0.44 10/19/2013 0900   BILITOT 0.5 09/07/2013 0405       No results found for this basename: LABCA2    No components found with this basename: LABCA125    No results found for this basename: INR,  in the last 168 hours  Urinalysis    Component Value Date/Time   COLORURINE YELLOW 09/06/2013 [place] 09/06/2013 1040   LABSPEC 1.015 09/06/2013 1040   PHURINE 5.0 09/06/2013 1040   GLUCOSEU NEGATIVE 09/06/2013 [place] 09/06/2013 [place] 09/06/2013 [place] 09/06/2013 1040   PROTEINUR NEGATIVE 09/06/2013 1040   UROBILINOGEN 0.2 09/06/2013 1040   NITRITE NEGATIVE 09/06/2013 1040   LEUKOCYTESUR NEGATIVE 09/06/2013 1040    STUDIES: No results found.  ASSESSMENT: 58 y.o. [place] woman   (1) status post right breast and right axillary lymph node biopsy 01/26/2013,  for a clinical T1c N1, stage IIA invasive ductal carcinoma, grade 3, estrogen and progesterone receptor positive, HER-2 amplified by immunohistochemistry (3+), with an MIB-1 of 70%.  (2) treated neo-adjuvantly with 4 of 6 planned cycles of docetaxel, carboplatin, trastuzumab,and pertuzumab, last dose 04/27/2013   (a) final 2 cycles of chemotherapy omitted in the face of multiple renal and electrolyte abnormalities    (b) pertuzumab and trastuzumab to be continued until definitive surgery  (c) trastuzumab alone being continued to complete one year of anti-HER-2 treatment  (d) echocardiograms every 3 months while on trastuzumab; most recent study 07/11/2013  (3) status post right lumpectomy and sentinel lymph node sampling 07/19/2013 showing a complete pathologic response (ypT0 ypN0)  (4) adjuvant radiation to follow surgery-- the patient is considering the B-51 study  (5) antiestrogen therapy to  follow radiation  PLAN: [name] is doing well today. The labs were reviewed in detail and were stable. She will proceed with trastuzumab today, and every 3 weeks thereafter. She is clinically stable and will only return to clinic for every other treatment, in other words, every 6 weeks.   She has no fevers or purulent drainage, so she will treat her sinus congestion symptomatically. I encouraged her to stay hydrated and to alert [place] of any temperature over 100.2.   After radiation is completed, she will proceed with antiestrogen therapy. [name] understands and agrees with this plan. She knows the goal of treatment in her case is cure. She has been encouraged to call with any issues that might arise before her next visit here.   10/19/2013 10:25 AM Marcelino Duster, NP

## 2013-10-19 NOTE — Telephone Encounter (Signed)
, °

## 2013-10-19 NOTE — Telephone Encounter (Signed)
Per staff message and POF I have scheduled appts. Advised scheduler of appts. JMW  

## 2013-10-20 ENCOUNTER — Ambulatory Visit
Admission: RE | Admit: 2013-10-20 | Discharge: 2013-10-20 | Disposition: A | Discharge status: Home / Self Care | Payer: Medicare Other | Source: Ambulatory Visit | Attending: Radiation Oncology | Admitting: Radiation Oncology

## 2013-10-20 DIAGNOSIS — Z510 Encounter for antineoplastic radiation therapy: Secondary | ICD-10-CM | POA: Diagnosis not present

## 2013-10-21 ENCOUNTER — Ambulatory Visit
Admission: RE | Admit: 2013-10-21 | Discharge: 2013-10-21 | Disposition: A | Discharge status: Home / Self Care | Payer: Medicare Other | Source: Ambulatory Visit | Attending: Radiation Oncology | Admitting: Radiation Oncology

## 2013-10-21 DIAGNOSIS — Z510 Encounter for antineoplastic radiation therapy: Secondary | ICD-10-CM | POA: Diagnosis not present

## 2013-10-24 ENCOUNTER — Other Ambulatory Visit: Payer: Self-pay | Admitting: Family Medicine

## 2013-10-24 ENCOUNTER — Ambulatory Visit
Admission: RE | Admit: 2013-10-24 | Discharge: 2013-10-24 | Disposition: A | Discharge status: Home / Self Care | Payer: Medicare Other | Source: Ambulatory Visit | Attending: Radiation Oncology | Admitting: Radiation Oncology

## 2013-10-24 DIAGNOSIS — Z510 Encounter for antineoplastic radiation therapy: Secondary | ICD-10-CM | POA: Diagnosis not present

## 2013-10-25 ENCOUNTER — Ambulatory Visit
Admission: RE | Admit: 2013-10-25 | Discharge: 2013-10-25 | Disposition: A | Discharge status: Home / Self Care | Payer: Medicare Other | Source: Ambulatory Visit | Attending: Radiation Oncology | Admitting: Radiation Oncology

## 2013-10-25 VITALS — BP 142/84 | HR 70 | Temp 98.4°F | Wt 254.9 lb

## 2013-10-25 DIAGNOSIS — Z510 Encounter for antineoplastic radiation therapy: Secondary | ICD-10-CM | POA: Diagnosis not present

## 2013-10-25 DIAGNOSIS — C50411 Malignant neoplasm of upper-outer quadrant of right female breast: Secondary | ICD-10-CM

## 2013-10-25 NOTE — Progress Notes (Signed)
Weekly assessment of radiation to right [email or web address] 11 of 25 treatments.Mild darkening without breaks in skin.Denies pain.No questions today.Continue application of radiaplex twice daily.

## 2013-10-25 NOTE — Progress Notes (Signed)
Weekly Management Note Current Dose: 22  Gy  Projected Dose: 60 Gy   Narrative:  The patient presents for routine under treatment assessment.  CBCT/MVCT images/Port film x-rays were reviewed.  The chart was checked. Doing well. No complaints.   Physical Findings: Weight: 254 lb 14.4 oz (115.622 kg). Unchanged  Impression:  The patient is tolerating radiation.  Plan:  Continue treatment as planned. Continue radiaplex.

## 2013-10-26 ENCOUNTER — Ambulatory Visit
Admission: RE | Admit: 2013-10-26 | Discharge: 2013-10-26 | Disposition: A | Discharge status: Home / Self Care | Payer: Medicare Other | Source: Ambulatory Visit | Attending: Radiation Oncology | Admitting: Radiation Oncology

## 2013-10-26 ENCOUNTER — Ambulatory Visit: Payer: Medicare Other

## 2013-10-26 DIAGNOSIS — Z510 Encounter for antineoplastic radiation therapy: Secondary | ICD-10-CM | POA: Diagnosis not present

## 2013-10-27 ENCOUNTER — Ambulatory Visit: Payer: Medicare Other

## 2013-10-27 ENCOUNTER — Ambulatory Visit
Admission: RE | Admit: 2013-10-27 | Discharge: 2013-10-27 | Disposition: A | Discharge status: Home / Self Care | Payer: Medicare Other | Source: Ambulatory Visit | Attending: Radiation Oncology | Admitting: Radiation Oncology

## 2013-10-27 DIAGNOSIS — Z510 Encounter for antineoplastic radiation therapy: Secondary | ICD-10-CM | POA: Diagnosis not present

## 2013-10-28 ENCOUNTER — Other Ambulatory Visit: Payer: Self-pay

## 2013-10-28 ENCOUNTER — Ambulatory Visit
Admission: RE | Admit: 2013-10-28 | Discharge: 2013-10-28 | Disposition: A | Discharge status: Home / Self Care | Payer: Medicare Other | Source: Ambulatory Visit | Attending: Radiation Oncology | Admitting: Radiation Oncology

## 2013-10-28 DIAGNOSIS — Z510 Encounter for antineoplastic radiation therapy: Secondary | ICD-10-CM | POA: Diagnosis not present

## 2013-10-31 ENCOUNTER — Ambulatory Visit
Admission: RE | Admit: 2013-10-31 | Discharge: 2013-10-31 | Disposition: A | Discharge status: Home / Self Care | Payer: Medicare Other | Source: Ambulatory Visit | Attending: Radiation Oncology | Admitting: Radiation Oncology

## 2013-10-31 DIAGNOSIS — Z510 Encounter for antineoplastic radiation therapy: Secondary | ICD-10-CM | POA: Diagnosis not present

## 2013-11-01 ENCOUNTER — Ambulatory Visit
Admission: RE | Admit: 2013-11-01 | Discharge: 2013-11-01 | Disposition: A | Discharge status: Home / Self Care | Payer: Medicare Other | Source: Ambulatory Visit | Attending: Radiation Oncology | Admitting: Radiation Oncology

## 2013-11-01 ENCOUNTER — Ambulatory Visit (INDEPENDENT_AMBULATORY_CARE_PROVIDER_SITE_OTHER): Payer: Medicare Other | Admitting: Family Medicine

## 2013-11-01 ENCOUNTER — Encounter: Payer: Self-pay | Admitting: Family Medicine

## 2013-11-01 VITALS — BP 132/80 | Temp 98.5°F | Ht 64.5 in | Wt 252.0 lb

## 2013-11-01 DIAGNOSIS — Z510 Encounter for antineoplastic radiation therapy: Secondary | ICD-10-CM | POA: Diagnosis not present

## 2013-11-01 DIAGNOSIS — Z23 Encounter for immunization: Secondary | ICD-10-CM

## 2013-11-01 DIAGNOSIS — E119 Type 2 diabetes mellitus without complications: Secondary | ICD-10-CM

## 2013-11-01 NOTE — Progress Notes (Signed)
   Subjective:    Patient ID: [name], female    DOB: 11/27/1955, 59 y.o.   MRN: [id number]  Hypertension This is a chronic problem. The current episode started more than 1 year ago. Compliance problems include exercise.   Patient is seeing Dr. Quay Burow for her diabetes.   Last check up and A1C was 6.5 per pt last month. Pt wants to start coming back here for diabetic check ups but will still see Dr. Quay Burow for thyroid.   Sees endocrine for chronic thyr difficulty  Bps good. Compliant with medication. Watching salt intake. Not exercising that much.  Recently diagnosed with breast cancer. Has already had her procedure. Is going through radiation therapy at this time.  Claims compliance with lipid medication. No obvious side effects. Does not miss a dose.    Flu vaccine today.     Review of Systems No headache no chest pain no abdominal pain and back pain and change in bowel habits    Objective:   Physical Exam  Alert no acute distress vital stable. HEENT normal no acute distress. Ankles without edema.      Assessment & Plan:  Impression 1 hypertension good control #2 breast cancer management discussed #3 type 2 diabetes stable patient to start coming back here for management #4 hyperlipidemia plan maintain all medications. Diet exercise discussed. Flu vaccine. No blood work today. Follow-up as scheduled. W SL

## 2013-11-02 ENCOUNTER — Ambulatory Visit: Payer: Medicare Other

## 2013-11-02 ENCOUNTER — Ambulatory Visit
Admission: RE | Admit: 2013-11-02 | Discharge: 2013-11-02 | Disposition: A | Discharge status: Home / Self Care | Payer: Medicare Other | Source: Ambulatory Visit | Attending: Radiation Oncology | Admitting: Radiation Oncology

## 2013-11-02 DIAGNOSIS — Z510 Encounter for antineoplastic radiation therapy: Secondary | ICD-10-CM | POA: Diagnosis not present

## 2013-11-03 ENCOUNTER — Ambulatory Visit
Admission: RE | Admit: 2013-11-03 | Discharge: 2013-11-03 | Disposition: A | Discharge status: Home / Self Care | Payer: Medicare Other | Source: Ambulatory Visit | Attending: Radiation Oncology | Admitting: Radiation Oncology

## 2013-11-03 ENCOUNTER — Encounter (HOSPITAL_COMMUNITY): Payer: Self-pay | Admitting: Emergency Medicine

## 2013-11-03 ENCOUNTER — Ambulatory Visit: Payer: Medicare Other

## 2013-11-03 ENCOUNTER — Emergency Department (HOSPITAL_COMMUNITY): Payer: Medicare Other

## 2013-11-03 ENCOUNTER — Inpatient Hospital Stay (HOSPITAL_COMMUNITY)
Admission: EM | Admit: 2013-11-03 | Discharge: 2013-11-09 | DRG: 309 | Disposition: A | Discharge status: Home / Self Care | Payer: Medicare Other | Attending: Internal Medicine | Admitting: Internal Medicine

## 2013-11-03 ENCOUNTER — Other Ambulatory Visit: Payer: Self-pay

## 2013-11-03 DIAGNOSIS — Z6841 Body Mass Index (BMI) 40.0 and over, adult: Secondary | ICD-10-CM

## 2013-11-03 DIAGNOSIS — I471 Supraventricular tachycardia: Principal | ICD-10-CM | POA: Diagnosis present

## 2013-11-03 DIAGNOSIS — Z888 Allergy status to other drugs, medicaments and biological substances status: Secondary | ICD-10-CM

## 2013-11-03 DIAGNOSIS — Z8249 Family history of ischemic heart disease and other diseases of the circulatory system: Secondary | ICD-10-CM

## 2013-11-03 DIAGNOSIS — E785 Hyperlipidemia, unspecified: Secondary | ICD-10-CM | POA: Diagnosis present

## 2013-11-03 DIAGNOSIS — C50411 Malignant neoplasm of upper-outer quadrant of right female breast: Secondary | ICD-10-CM

## 2013-11-03 DIAGNOSIS — I483 Typical atrial flutter: Secondary | ICD-10-CM

## 2013-11-03 DIAGNOSIS — K30 Functional dyspepsia: Secondary | ICD-10-CM | POA: Diagnosis present

## 2013-11-03 DIAGNOSIS — I10 Essential (primary) hypertension: Secondary | ICD-10-CM

## 2013-11-03 DIAGNOSIS — E1169 Type 2 diabetes mellitus with other specified complication: Secondary | ICD-10-CM

## 2013-11-03 DIAGNOSIS — I129 Hypertensive chronic kidney disease with stage 1 through stage 4 chronic kidney disease, or unspecified chronic kidney disease: Secondary | ICD-10-CM | POA: Diagnosis present

## 2013-11-03 DIAGNOSIS — I4719 Other supraventricular tachycardia: Secondary | ICD-10-CM

## 2013-11-03 DIAGNOSIS — Z7982 Long term (current) use of aspirin: Secondary | ICD-10-CM

## 2013-11-03 DIAGNOSIS — F17210 Nicotine dependence, cigarettes, uncomplicated: Secondary | ICD-10-CM | POA: Diagnosis present

## 2013-11-03 DIAGNOSIS — Z853 Personal history of malignant neoplasm of breast: Secondary | ICD-10-CM

## 2013-11-03 DIAGNOSIS — Z833 Family history of diabetes mellitus: Secondary | ICD-10-CM

## 2013-11-03 DIAGNOSIS — E0500 Thyrotoxicosis with diffuse goiter without thyrotoxic crisis or storm: Secondary | ICD-10-CM | POA: Diagnosis present

## 2013-11-03 DIAGNOSIS — Z510 Encounter for antineoplastic radiation therapy: Secondary | ICD-10-CM | POA: Diagnosis not present

## 2013-11-03 DIAGNOSIS — Z170 Estrogen receptor positive status [ER+]: Secondary | ICD-10-CM | POA: Diagnosis present

## 2013-11-03 DIAGNOSIS — K219 Gastro-esophageal reflux disease without esophagitis: Secondary | ICD-10-CM | POA: Diagnosis present

## 2013-11-03 DIAGNOSIS — R079 Chest pain, unspecified: Secondary | ICD-10-CM

## 2013-11-03 DIAGNOSIS — E039 Hypothyroidism, unspecified: Secondary | ICD-10-CM | POA: Diagnosis present

## 2013-11-03 DIAGNOSIS — J45909 Unspecified asthma, uncomplicated: Secondary | ICD-10-CM | POA: Diagnosis present

## 2013-11-03 DIAGNOSIS — E0590 Thyrotoxicosis, unspecified without thyrotoxic crisis or storm: Secondary | ICD-10-CM | POA: Diagnosis present

## 2013-11-03 DIAGNOSIS — E119 Type 2 diabetes mellitus without complications: Secondary | ICD-10-CM

## 2013-11-03 DIAGNOSIS — I4892 Unspecified atrial flutter: Secondary | ICD-10-CM

## 2013-11-03 DIAGNOSIS — I4891 Unspecified atrial fibrillation: Secondary | ICD-10-CM

## 2013-11-03 DIAGNOSIS — N182 Chronic kidney disease, stage 2 (mild): Secondary | ICD-10-CM | POA: Diagnosis present

## 2013-11-03 DIAGNOSIS — R0789 Other chest pain: Secondary | ICD-10-CM

## 2013-11-03 DIAGNOSIS — Z801 Family history of malignant neoplasm of trachea, bronchus and lung: Secondary | ICD-10-CM

## 2013-11-03 DIAGNOSIS — I340 Nonrheumatic mitral (valve) insufficiency: Secondary | ICD-10-CM

## 2013-11-03 DIAGNOSIS — E669 Obesity, unspecified: Secondary | ICD-10-CM | POA: Diagnosis present

## 2013-11-03 DIAGNOSIS — D649 Anemia, unspecified: Secondary | ICD-10-CM | POA: Diagnosis present

## 2013-11-03 DIAGNOSIS — Z96652 Presence of left artificial knee joint: Secondary | ICD-10-CM | POA: Diagnosis present

## 2013-11-03 DIAGNOSIS — Z825 Family history of asthma and other chronic lower respiratory diseases: Secondary | ICD-10-CM

## 2013-11-03 LAB — BASIC METABOLIC PANEL
ANION GAP: 17 — AB (ref 5–15)
BUN: 18 mg/dL (ref 6–23)
CHLORIDE: 100 meq/L (ref 96–112)
CO2: 23 meq/L (ref 19–32)
Calcium: 9.9 mg/dL (ref 8.4–10.5)
Creatinine, Ser: 1.11 mg/dL — ABNORMAL HIGH (ref 0.50–1.10)
GFR calc Af Amer: 62 mL/min — ABNORMAL LOW (ref >90)
GFR calc non Af Amer: 53 mL/min — ABNORMAL LOW (ref >90)
Glucose, Bld: 143 mg/dL — ABNORMAL HIGH (ref 70–99)
Potassium: 4.1 mEq/L (ref 3.7–5.3)
SODIUM: 140 meq/L (ref 137–147)

## 2013-11-03 LAB — CBC WITH DIFFERENTIAL/PLATELET
BASOS ABS: 0 10*3/uL (ref 0.0–0.1)
Basophils Relative: 1 % (ref 0–1)
Eosinophils Absolute: 0.2 10*3/uL (ref 0.0–0.7)
Eosinophils Relative: 3 % (ref 0–5)
HEMATOCRIT: 35.9 % — AB (ref 36.0–46.0)
Hemoglobin: 11.7 g/dL — ABNORMAL LOW (ref 12.0–15.0)
LYMPHS ABS: 1.2 10*3/uL (ref 0.7–4.0)
LYMPHS PCT: 22 % (ref 12–46)
MCH: 26.1 pg (ref 26.0–34.0)
MCHC: 32.6 g/dL (ref 30.0–36.0)
MCV: 80.1 fL (ref 78.0–100.0)
Monocytes Absolute: 0.4 10*3/uL (ref 0.1–1.0)
Monocytes Relative: 7 % (ref 3–12)
Neutro Abs: 3.8 10*3/uL (ref 1.7–7.7)
Neutrophils Relative %: 67 % (ref 43–77)
PLATELETS: 224 10*3/uL (ref 150–400)
RBC: 4.48 MIL/uL (ref 3.87–5.11)
RDW: 16.5 % — ABNORMAL HIGH (ref 11.5–15.5)
WBC: 5.6 10*3/uL (ref 4.0–10.5)

## 2013-11-03 LAB — I-STAT TROPONIN, ED: Troponin i, poc: 0.03 ng/mL (ref 0.00–0.08)

## 2013-11-03 MED ORDER — METOPROLOL TARTRATE 1 MG/ML IV SOLN
5.0000 mg | Freq: Once | INTRAVENOUS | Status: AC
  Administered 2013-11-03: 5 mg via INTRAVENOUS
  Filled 2013-11-03: qty 5

## 2013-11-03 MED ORDER — ESMOLOL HCL-SODIUM CHLORIDE 2000 MG/100ML IV SOLN
50.0000 ug/kg/min | Freq: Once | INTRAVENOUS | Status: AC
  Administered 2013-11-03: 50 ug/kg/min via INTRAVENOUS
  Filled 2013-11-03: qty 100

## 2013-11-03 MED ORDER — IOHEXOL 350 MG/ML SOLN
100.0000 mL | Freq: Once | INTRAVENOUS | Status: AC | PRN
Start: 2013-11-03 — End: 2013-11-03
  Administered 2013-11-03: 100 mL via INTRAVENOUS

## 2013-11-03 MED ORDER — SODIUM CHLORIDE 0.9 % IV SOLN
INTRAVENOUS | Status: DC
  Administered 2013-11-04 – 2013-11-06 (×3): via INTRAVENOUS

## 2013-11-03 MED ORDER — HEPARIN SODIUM (PORCINE) 5000 UNIT/ML IJ SOLN
5000.0000 [IU] | Freq: Three times a day (TID) | INTRAMUSCULAR | Status: DC
  Administered 2013-11-04 – 2013-11-05 (×5): 5000 [IU] via SUBCUTANEOUS
  Filled 2013-11-03 (×7): qty 1

## 2013-11-03 MED ORDER — DILTIAZEM LOAD VIA INFUSION
20.0000 mg | Freq: Once | INTRAVENOUS | Status: AC
  Administered 2013-11-03: 20 mg via INTRAVENOUS

## 2013-11-03 MED ORDER — LEVOTHYROXINE SODIUM 125 MCG PO TABS
125.0000 ug | ORAL_TABLET | Freq: Every day | ORAL | Status: DC
  Administered 2013-11-04 – 2013-11-09 (×6): 125 ug via ORAL
  Filled 2013-11-03 (×7): qty 1

## 2013-11-03 MED ORDER — INSULIN ASPART 100 UNIT/ML ~~LOC~~ SOLN
0.0000 [IU] | SUBCUTANEOUS | Status: DC
  Administered 2013-11-04: 2 [IU] via SUBCUTANEOUS
  Administered 2013-11-04 (×3): 1 [IU] via SUBCUTANEOUS

## 2013-11-03 MED ORDER — SODIUM CHLORIDE 0.9 % IJ SOLN
3.0000 mL | Freq: Two times a day (BID) | INTRAMUSCULAR | Status: DC
  Administered 2013-11-04 – 2013-11-06 (×3): 3 mL via INTRAVENOUS

## 2013-11-03 MED ORDER — PRAVASTATIN SODIUM 40 MG PO TABS
40.0000 mg | ORAL_TABLET | Freq: Every day | ORAL | Status: DC
  Administered 2013-11-04 – 2013-11-09 (×6): 40 mg via ORAL
  Filled 2013-11-03 (×7): qty 1

## 2013-11-03 MED ORDER — RADIAPLEXRX EX GEL
Freq: Once | CUTANEOUS | Status: AC
  Administered 2013-11-03: 10:00:00 via TOPICAL

## 2013-11-03 MED ORDER — DILTIAZEM HCL 100 MG IV SOLR
5.0000 mg/h | INTRAVENOUS | Status: DC
  Administered 2013-11-03: 10 mg/h via INTRAVENOUS

## 2013-11-03 MED ORDER — GABAPENTIN 100 MG PO CAPS
100.0000 mg | ORAL_CAPSULE | Freq: Three times a day (TID) | ORAL | Status: DC
  Administered 2013-11-04 – 2013-11-09 (×17): 100 mg via ORAL
  Filled 2013-11-03 (×19): qty 1

## 2013-11-03 NOTE — Progress Notes (Signed)
Weekly Management Note Current Dose: 36  Gy  Projected Dose: 50 Gy   Narrative:  The patient presents for routine under treatment assessment.  CBCT/MVCT images/Port film x-rays were reviewed.  The chart was checked. Doing well. No complaints. Needs more radiaplex.  Physical Findings: Weight:  . Unchanged. Dark. Skin in inframammary fold is intact.   Impression:  The patient is tolerating radiation.  Plan:  Continue treatment as planned. Gave extra tube of radiaplex.

## 2013-11-03 NOTE — ED Notes (Addendum)
Pt to ED from home via [facility] c/o central chest pain radiating to L shoulder and and L arm at 3pm. CP described as indigestion; pain worsens on ambulation. Reports increased shortness of breath and lightheadedness on exertion.  Pt has been receiving radiation x 3 weeks for R sided breast cancer.

## 2013-11-03 NOTE — ED Notes (Signed)
Pt assisted to bed side commode; HR maintained in 120s.

## 2013-11-03 NOTE — ED Notes (Signed)
Cardiology at bedside.

## 2013-11-03 NOTE — Progress Notes (Signed)
Patient seen by Dr.Wentworth in treatment room.Given an additional tube of radiaplex.

## 2013-11-03 NOTE — ED Notes (Signed)
Report attempted, nurse is taking report on floor, will call back in 10 min.

## 2013-11-03 NOTE — ED Notes (Signed)
Spoke with CT regarding power port use for contrast; port needs to be accessed by IV team; IV team paged

## 2013-11-03 NOTE — ED Notes (Signed)
Dr Miller at bedside. 

## 2013-11-03 NOTE — ED Notes (Signed)
NT attempted to stand patient; patients HR 200; MD at bedside. Pt assisted back to bed.

## 2013-11-03 NOTE — ED Provider Notes (Signed)
The patient is a 58 year old female, history of breast cancer, no known history of coronary disease since an angioplasty many many years ago. She presents with acute onset of chest pain with palpitations, paramedics reported seeing atrial fibrillation on the monitor, this improved somewhat in route. On exam the patient appears to be in distress, she has a rapid heart rate measuring between 170 and 210 beats per minute and a narrow complex irregular tachycardia. She has evidence of a prior left total knee arthroplasty, this leg is very stiff and immobile, she does have swelling left compared to right.  The patient has ongoing tachycardia with rapid atrial fibrillation, she will require Cardizem bolus with a drip, further evaluation for pulmonary embolism as the source of the patient's chest pain shortness of breath and arrhythmia.  Discussed care with cardiology, cardiologist recommends ongoing beta blocker usage including esmolol drip, recommends admission to the hospitalist service as there is no signs of pulmonary embolism or acute cardiac ischemia. The patient is currently receiving an esmolol drip with some success and patient's heart rate. Currently the patient is a regular narrow complex tachycardia 120 bpm.    D/w Dr. Ernestina Patches who will admit.   ED ECG REPORT  I personally interpreted this EKG   Date: 11/03/2013   Rate: 124  Rhythm: atrial fibrillation with rvr  QRS Axis: normal  Intervals: normal  ST/T Wave abnormalities: nonspecific T wave changes  Conduction Disutrbances:none  Narrative Interpretation:   Old EKG Reviewed: none available  CRITICAL CARE Performed by: Johnna Acosta Total critical care time: 35 Critical care time was exclusive of separately billable procedures and treating other patients. Critical care was necessary to treat or prevent imminent or life-threatening deterioration. Critical care was time spent personally by me on the following activities: development of  treatment plan with patient and/or surrogate as well as nursing, discussions with consultants, evaluation of patient's response to treatment, examination of patient, obtaining history from patient or surrogate, ordering and performing treatments and interventions, ordering and review of laboratory studies, ordering and review of radiographic studies, pulse oximetry and re-evaluation of patient's condition.   Medical screening examination/treatment/procedure(s) were conducted as a shared visit with non-physician practitioner(s) and myself.  I personally evaluated the patient during the encounter.  Clinical Impression:   Final diagnoses:  Chest pain  Atrial fibrillation with rapid ventricular response         Johnna Acosta, MD 11/04/13 [phone]

## 2013-11-03 NOTE — ED Provider Notes (Signed)
CSN: [id number]     Arrival date & time 11/03/13  1923 History   First MD Initiated Contact with Patient 11/03/13 1928     Chief Complaint  Patient presents with  . Chest Pain   [name] is a 58 y.o. female with history of breast cancer and currently undergoing chemotherapy and radiation therapy presents to the ED via EMS with substernal chest pain and tightness for the past 4 hours. She reports that her pain worsened while getting up to walk her kitchen. Upon EMS arrival they noted her to be in atrial fibrillation this resolved in route to the hospital. The patient reports that she took 325 mg of aspirin. EMS gave her one dose of nitroglycerin which improved her pain from a 9/10 to a 4/10. Patient reports feeling short of breath or chest pain. Patient has history of hyperlipidemia, diabetes, hypertension, she is a former smoker. She has no history of previous acute coronary syndrome. She's had a normal cardiac cath many years ago. Patient is undergoing chemotherapy and radiation therapy for her breast cancer. Patient has a previous left knee arthroplasty. She has limited mobility of her left knee. Denies personal or family history of PE or DVT. She denies a previous history of atrial fibrillation. She denies fevers, chills, headaches, cough, wheezing, abdominal pain, nausea, vomiting, diarrhea, rashes, dizziness, numbness, tingling.  (Consider location/radiation/quality/duration/timing/severity/associated sxs/prior Treatment) Patient is a 58 y.o. female presenting with chest pain. The history is provided by the patient.  Chest Pain Pain location:  Substernal area Pain quality: tightness   Pain radiates to:  Does not radiate Pain radiates to the back: no   Pain severity:  Moderate Onset quality:  Sudden Duration:  4 hours Timing:  Constant Progression:  Worsening Chronicity:  New Context: at rest   Context: no drug use, no stress and no trauma   Relieved by:   Nitroglycerin Associated symptoms: shortness of breath   Associated symptoms: no abdominal pain, no altered mental status, no back pain, no cough, no diaphoresis, no fatigue, no fever, no headache, no heartburn, no numbness, no syncope, not vomiting and no weakness     Past Medical History  Diagnosis Date  . Hyperlipidemia   . Obesity   . Asthma     prn neb. and inhaler  . Osteoarthritis 2003    left knee  . Non-insulin dependent type 2 diabetes mellitus   . Graves' disease   . Ophthalmic manifestation of Graves disease   . Hypertension     on multiple meds., has been on med. > 14 yr.  . Dehydration 04/13/2013  . Breast cancer 01/2013    right  . Wears glasses   . GERD (gastroesophageal reflux disease)    Past Surgical History  Procedure Laterality Date  . Total knee arthroplasty Left 2003  . Total thyroidectomy    . Colonoscopy  2007  . Tubal ligation  1985  . Colonoscopy w/ polypectomy  2009  . Portacath placement Right 02/17/2013    Procedure: INSERTION PORT-A-CATH;  Surgeon: Joyice Faster. Cornett, MD;  Location: [facility];  Service: General;  Laterality: Right;   Family History  Problem Relation Age of Onset  . Heart disease Father   . Lung cancer Father   . Diabetes Mother   . Diabetes Sister   . Hypertension Sister   . Asthma Sister    History  Substance Use Topics  . Smoking status: Former Smoker -- 0.25 packs/day for 20 years  Quit date: 02/10/1991  . Smokeless tobacco: Never Used  . Alcohol Use: No   OB History   Grav Para Term Preterm Abortions TAB SAB Ect Mult Living                 Review of Systems  Constitutional: Negative for fever, diaphoresis and fatigue.  Respiratory: Positive for shortness of breath. Negative for cough.   Cardiovascular: Positive for chest pain. Negative for syncope.  Gastrointestinal: Negative for heartburn, vomiting, abdominal pain and diarrhea.  Genitourinary: Negative for dysuria.  Musculoskeletal: Negative  for back pain.  Skin: Negative for rash.  Neurological: Negative for syncope, weakness, numbness and headaches.  All other systems reviewed and are negative.     Allergies  Procardia  Home Medications   Prior to Admission medications   Medication Sig Start Date End Date Taking? Authorizing Provider  acetaminophen (TYLENOL) 500 MG tablet Take 1,000 mg by mouth every 4 (four) hours as needed for moderate pain.   Yes Historical Provider, MD  albuterol (PROVENTIL HFA;VENTOLIN HFA) 108 (90 BASE) MCG/ACT inhaler Inhale 2 puffs into the lungs every 4 (four) hours as needed. 02/02/13  Yes Mikey Kirschner, MD  albuterol (PROVENTIL) (2.5 MG/3ML) 0.083% nebulizer solution Take 3 mLs (2.5 mg total) by nebulization every 6 (six) hours as needed for wheezing or shortness of breath. 08/19/13  Yes Kathyrn Drown, MD  amLODipine (NORVASC) 10 MG tablet Take 10 mg by mouth daily.   Yes Historical Provider, MD  Artificial Tear Ointment (ARTIFICIAL TEARS) ointment Place 1 drop into both eyes as needed (for grey eye disease).    Yes Historical Provider, MD  aspirin 81 MG tablet Take 81 mg by mouth daily.   Yes Historical Provider, MD  benazepril (LOTENSIN) 40 MG tablet Take 40 mg by mouth daily.  09/01/13  Yes Historical Provider, MD  calcium carbonate (TUMS EX) 750 MG chewable tablet Chew 2 tablets by mouth 4 (four) times daily.   Yes Historical Provider, MD  cholecalciferol (VITAMIN D) 1000 UNITS tablet Take 1,000 Units by mouth daily.   Yes Historical Provider, MD  cloNIDine (CATAPRES - DOSED IN MG/24 HR) 0.2 mg/24hr patch Place 0.2 mg onto the skin once a week. Sunday/Mondays.   Yes Historical Provider, MD  cycloSPORINE (RESTASIS) 0.05 % ophthalmic emulsion Place 1 drop into both eyes 2 (two) times daily.    Yes Historical Provider, MD  gabapentin (NEURONTIN) 100 MG capsule Take 1 capsule (100 mg total) by mouth 3 (three) times daily. 04/13/13  Yes Deatra Robinson, MD  hydrochlorothiazide (HYDRODIURIL) 25 MG  tablet Take 25 mg by mouth daily.   Yes Historical Provider, MD  ibuprofen (ADVIL,MOTRIN) 200 MG tablet Take 400 mg by mouth every 6 (six) hours as needed for mild pain or moderate pain.   Yes Historical Provider, MD  labetalol (NORMODYNE) 300 MG tablet Take 300 mg by mouth 2 (two) times daily.   Yes Historical Provider, MD  levothyroxine (SYNTHROID, LEVOTHROID) 125 MCG tablet Take 1 tablet (125 mcg total) by mouth daily before breakfast. 05/04/13  Yes Estela Leonie Green, MD  metFORMIN (GLUCOPHAGE) 500 MG tablet Take 500 mg by mouth 2 (two) times daily with a meal.   Yes Historical Provider, MD  pravastatin (PRAVACHOL) 80 MG tablet Take 40 mg by mouth at bedtime.   Yes Historical Provider, MD  vitamin B-12 (CYANOCOBALAMIN) 500 MCG tablet Take 500 mcg by mouth daily.   Yes Historical Provider, MD   BP 128/88  Pulse  127  Temp(Src) 98.7 F (37.1 C) (Oral)  Resp 17  Wt 251 lb 15.8 oz (114.3 kg)  SpO2 99% Physical Exam  Nursing note and vitals reviewed. Constitutional: She is oriented to person, place, and time. She appears well-developed and well-nourished.  HENT:  Head: Normocephalic and atraumatic.  Mouth/Throat: Oropharynx is clear and moist.  Eyes: Conjunctivae are normal. Pupils are equal, round, and reactive to light. Right eye exhibits no discharge. Left eye exhibits no discharge.  Neck: Neck supple.  Cardiovascular: Normal heart sounds and intact distal pulses.  Exam reveals no gallop and no friction rub.   No murmur heard. Patient in a normal complex tachycardia at a heart rate of 120. Chest is nontender to palpation.  Pulmonary/Chest: Effort normal and breath sounds normal. No respiratory distress. She has no wheezes. She has no rales. She exhibits no tenderness.  Abdominal: Soft. She exhibits no distension. There is no tenderness.  Musculoskeletal: She exhibits no edema.  Decreased ROM to her left knee. Previous left knee arthroplasty. Bilateral calves are symmetric and  nontender to palpation. Homans sign negative. Posterior tibialis pulses intact bilaterally.  Lymphadenopathy:    She has no cervical adenopathy.  Neurological: She is alert and oriented to person, place, and time. Coordination normal.  Skin: Skin is warm and dry. No rash noted. She is not diaphoretic. No erythema. No pallor.  Psychiatric: She has a normal mood and affect. Her behavior is normal.    ED Course  Procedures (including critical care time) Labs Review Labs Reviewed  CBC WITH DIFFERENTIAL - Abnormal; Notable for the following:    Hemoglobin 11.7 (*)    HCT 35.9 (*)    RDW 16.5 (*)    All other components within normal limits  BASIC METABOLIC PANEL - Abnormal; Notable for the following:    Glucose, Bld 143 (*)    Creatinine, Ser 1.11 (*)    GFR calc non Af Amer 53 (*)    GFR calc Af Amer 62 (*)    Anion gap 17 (*)    All other components within normal limits  CBC  CREATININE, SERUM  COMPREHENSIVE METABOLIC PANEL  CBC WITH DIFFERENTIAL  TSH  TROPONIN I  TROPONIN I  TROPONIN I  PRO B NATRIURETIC PEPTIDE  I-STAT TROPOININ, ED   Filed Vitals:   11/03/13 2235 11/03/13 2300 11/03/13 2330 11/03/13 2331  BP: 151/100 128/94 128/88 128/88  Pulse: 124 120 120 127  Temp:    98.7 F (37.1 C)  TempSrc:    Oral  Resp: 16 20 18 17   Weight:      SpO2: 99% 98% 98% 99%    Imaging Review Ct Angio Chest Pe W/cm &/or Wo Cm  11/03/2013   CLINICAL DATA:  Mid chest pain starting 3 p.m. today, shortness of Breath  EXAM: CT ANGIOGRAPHY CHEST WITH CONTRAST  TECHNIQUE: Multidetector CT imaging of the chest was performed using the standard protocol during bolus administration of intravenous contrast. Multiplanar CT image reconstructions and MIPs were obtained to evaluate the vascular anatomy.  CONTRAST:  149mL OMNIPAQUE IOHEXOL 350 MG/ML SOLN  COMPARISON:  02/26/2011  FINDINGS: There is significant skin thickening right breast measures up to 1 cm. Inflammatory or neoplastic disease cannot  be excluded. Clinical correlation is necessary.  Sagittal images of the spine shows degenerative changes thoracic spine. No destructive bony lesions are noted.  Surgical clips are noted at the level of thoracic inlet probable post thyroid surgery. Central airways are patent. No aortic aneurysm. Mild  atherosclerotic calcifications of thoracic aorta. There is cardiomegaly.  No pulmonary embolus is identified.  The visualized upper abdomen shows no adrenal gland mass.  Images of the lung parenchyma shows no acute infiltrate or pulmonary edema. No pulmonary nodules are noted. There is no evidence of pneumothorax.  There is no mediastinal hematoma or adenopathy. No hilar adenopathy. No bronchiectasis.  Review of the MIP images confirms the above findings.  IMPRESSION: 1. No pulmonary embolus is noted. 2. No acute infiltrate or pulmonary edema. No mediastinal hematoma or adenopathy. 3. Significant skin thickening right breast up to 1 cm. Inflammatory or neoplastic metastatic disease cannot be excluded. Clinical correlation is necessary. 4. Degenerative changes thoracic spine.   Electronically Signed   By: Lahoma Crocker M.D.   On: 11/03/2013 22:33   Dg Chest Port 1 View  11/03/2013   CLINICAL DATA:  Chest pain and shortness of breath for 1 day  EXAM: PORTABLE CHEST - 1 VIEW  COMPARISON:  September 06, 2013  FINDINGS: The heart size and mediastinal contours are stable. Right central venous line is unchanged. Surgical clips are identified in the superior mediastinum. There is no focal infiltrate, pulmonary edema, or pleural effusion. The visualized skeletal structures are stable.  IMPRESSION: No active cardiopulmonary disease.   Electronically Signed   By: Abelardo Diesel M.D.   On: 11/03/2013 20:30     EKG Interpretation None     Meds given in ED:  Please see attached MAR summary for meds given in the ED.     MDM    Final diagnoses:  Chest pain  Atrial fibrillation with rapid ventricular response    After  initial evaluation the patient attempted to get up to use the restroom and was noted to be in A. fib with RVR with heart rate of 170-220. Cardizem was started the patient reported that she felt itching and burning around the site of her IV. No rash is noted around the IV site. Cardizem was discontinued and she was placed on esmolol. Esmolol was successful in slowing her heart rate was a narrow complex tachycardia at 120. Her initial troponin was negative. Her CBC notes a slightly decreased hemoglobin at 11.7. Her creatinine is elevated at 1.11 and she has a GFR of 62. CT and she does negative for PE. Chest x-ray was negative for cardiopulmonary disease. Cardiology consult was performed by Dr. Sabra Heck. Patient was admitted to telemetry floor. Patient seen and evaluated in conjunction with Dr. Sabra Heck.    Hanley Hays, [place] 11/04/13 [phone]

## 2013-11-03 NOTE — ED Notes (Addendum)
PT reports centralized chest pain at 3pm associated with dizziness, increased shortness of breath. EMS gave nitro x 1 with pain decreased from 7/10 to 4/10. Pt took 324mg  ASA prior to EMS arrival. Initial bp 250/120; bp decreased to 140/100 after one nitro. Reports pain worse on exertion. CBG 112 for EMS. HR 120-130s, denies hx of afib, MI or stents. Pt did see a cardiologist due to chemo medication. Pt received radiation to L breast x 3 weeks ago. Power port placed in R chest, port not accessed at this time. Pt also has Cardene patch placed on L chest

## 2013-11-03 NOTE — H&P (Addendum)
Hospitalist Admission History and Physical  Patient name: [name] Medical record number: [id number] Date of birth: 26-Jan-1955 Age: 58 y.o. Gender: female  Primary Care Provider: Rubbie Battiest, MD  Chief Complaint: chest pain, narrow complex tachycardia   History of Present Illness:This is a 58 y.o. year old female with significant past medical history of breast cancer s/p lumpectomy-on radiation and chemotherapy, NIDDM, stage 2 CKD, HTN, asthma, hyperthyroidism presenting with chest pain, narrow complex tachycardia. Patient states that she was at home today when she developed sudden onset of central chest pain. Patient states that she attempted to sit down with minimal improvement in symptoms. Patient checked her blood pressure and was stable in the 130s. Checked her sugar and will stable in the 110s. Patient notes that her heart rate was greater than 120. Symptoms persisted. This is when she called her daughter to come home. Subsequently called EMS. Last radiation was 3 weeks ago. Does report increase caffeine use recently.  Presents to the ER temperature 98.7, heart rate in the 120s to 130s, respirations intensive 20s, blood pressure in the 120s to 150s, setting 90% on room air. White blood cell count 5.6, hemoglobin 11.7, creatinine 1.1. Troponin negative x1. CT angiogram chest negative for PE but no infiltrate or pulmonary edema noted. Noted skin thickening on the right breast at the 1 cm concerning for inflammatory or neoplastic disease. Chest x-ray within normal limits. Initial EKG with ectopic atrial tachycardia heart rate into the 120s. Her ER physician, patient was observed at the bedside with heart rate into the 170s. Patient was initially given IV diltiazem. However, this was not tolerated by patient. Patient was then switched to an esmolol drip. HR decreased into 110s with symptomatic improvement. Cardiology consulted.   Assessment and Plan: [name] is a 58 y.o. year old  female presenting with chest pain, narrow complex tachycardia.   Active Problems:   Narrow complex tachycardia   Chest pain   1- Chest Pain/Narrow Complex Tachycardia -cont esmolol drip  -no true afib on EKG-multiple narrow complex rhythms  -preliminary CHADSVASC score around 3.  -may need repeat EKG to confirm  -cards consult pending-follow up recs  -cycle CEs -tele bed  -2D ECHO-no findings concerning for pericardial effusion/pericarditis in setting of radiation currently-follow up cards recs    2- Breast Cancer  -R sided  -s/p lumpectomy, radiation and chemotherapy  -? Radiation contributing to presentation  3- HTN -hold oral meds -cont esmolol drip  -f/u cards recs   4-NIDDM -SSI  -A1C   5-Asthma -no resp distress -prn nebs-use judiciously in setting of above   6-CKD  -At baseline  -follow    FEN/GI: heart healthy, carb modified diet  Prophylaxis: sub q heparin  Disposition: pending further evaluation  Code Status:Full Code    Patient Active Problem List   Diagnosis Date Noted  . Narrow complex tachycardia 11/03/2013  . Sinus congestion 10/19/2013  . Cellulitis of breast, DDx inflammatroy Ca breast 09/06/2013  . Malnutrition of moderate degree 05/11/2013  . Renal failure 05/08/2013  . CKD (chronic kidney disease) stage 2, GFR 60-89 ml/min 05/08/2013  . Acute renal failure 05/08/2013  . Diarrhea 05/08/2013  . Hyponatremia 05/08/2013  . UTI (urinary tract infection) 05/04/2013  . Hypocalcemia 05/04/2013  . Unspecified hypothyroidism 05/04/2013  . Dehydration 04/13/2013  . Breast cancer of upper-outer quadrant of right female breast 01/31/2013  . Atypical chest pain 02/12/2012  . Osteoarthritis   . Asthma 12/08/2010  . Degenerative joint disease 12/08/2010  .  Obesity   . Anemia, iron deficiency   . Hyperlipidemia 10/16/2010  . Hypertension   . Diabetes mellitus, type 2   . Hyperthyroidism   . Tobacco abuse, in remission    Past Medical  History: Past Medical History  Diagnosis Date  . Hyperlipidemia   . Obesity   . Asthma     prn neb. and inhaler  . Osteoarthritis 2003    left knee  . Non-insulin dependent type 2 diabetes mellitus   . Graves' disease   . Ophthalmic manifestation of Graves disease   . Hypertension     on multiple meds., has been on med. > 14 yr.  . Dehydration 04/13/2013  . Breast cancer 01/2013    right  . Wears glasses   . GERD (gastroesophageal reflux disease)     Past Surgical History: Past Surgical History  Procedure Laterality Date  . Total knee arthroplasty Left 2003  . Total thyroidectomy    . Colonoscopy  2007  . Tubal ligation  1985  . Colonoscopy w/ polypectomy  2009  . Portacath placement Right 02/17/2013    Procedure: INSERTION PORT-A-CATH;  Surgeon: Joyice Faster. Cornett, MD;  Location: [facility];  Service: General;  Laterality: Right;    Social History: History   Social History  . Marital Status: Divorced    Spouse Name: N/A    Number of Children: 2  . Years of Education: N/A   Occupational History  . Disability    Social History Main Topics  . Smoking status: Former Smoker -- 0.25 packs/day for 20 years    Quit date: 02/10/1991  . Smokeless tobacco: Never Used  . Alcohol Use: No  . Drug Use: No  . Sexual Activity: No   Other Topics Concern  . None   Social History Narrative   Lives in [place]          Family History: Family History  Problem Relation Age of Onset  . Heart disease Father   . Lung cancer Father   . Diabetes Mother   . Diabetes Sister   . Hypertension Sister   . Asthma Sister     Allergies: Allergies  Allergen Reactions  . Procardia [Nifedipine] Other (See Comments)    MIGRAINES    Current Facility-Administered Medications  Medication Dose Route Frequency Provider Last Rate Last Dose  . 0.9 %  sodium chloride infusion   Intravenous Continuous Shanda Howells, MD      . gabapentin (NEURONTIN) capsule 100 mg  100 mg  Oral TID Shanda Howells, MD      . heparin injection 5,000 Units  5,000 Units Subcutaneous 3 times per day Shanda Howells, MD      . Derrill Memo ON 11/04/2013] insulin aspart (novoLOG) injection 0-9 Units  0-9 Units Subcutaneous 6 times per day Shanda Howells, MD      . Derrill Memo ON 11/04/2013] levothyroxine (SYNTHROID, LEVOTHROID) tablet 125 mcg  125 mcg Oral QAC breakfast Shanda Howells, MD      . pravastatin (PRAVACHOL) tablet 40 mg  40 mg Oral QHS Shanda Howells, MD      . sodium chloride 0.9 % injection 3 mL  3 mL Intravenous Q12H Shanda Howells, MD       Current Outpatient Prescriptions  Medication Sig Dispense Refill  . acetaminophen (TYLENOL) 500 MG tablet Take 1,000 mg by mouth every 4 (four) hours as needed for moderate pain.      Marland Kitchen albuterol (PROVENTIL HFA;VENTOLIN HFA) 108 (90 BASE) MCG/ACT inhaler Inhale  2 puffs into the lungs every 4 (four) hours as needed.  18 g  2  . albuterol (PROVENTIL) (2.5 MG/3ML) 0.083% nebulizer solution Take 3 mLs (2.5 mg total) by nebulization every 6 (six) hours as needed for wheezing or shortness of breath.  150 mL  0  . amLODipine (NORVASC) 10 MG tablet Take 10 mg by mouth daily.      . Artificial Tear Ointment (ARTIFICIAL TEARS) ointment Place 1 drop into both eyes as needed (for grey eye disease).       Marland Kitchen aspirin 81 MG tablet Take 81 mg by mouth daily.      . benazepril (LOTENSIN) 40 MG tablet Take 40 mg by mouth daily.       . calcium carbonate (TUMS EX) 750 MG chewable tablet Chew 2 tablets by mouth 4 (four) times daily.      . cholecalciferol (VITAMIN D) 1000 UNITS tablet Take 1,000 Units by mouth daily.      . cloNIDine (CATAPRES - DOSED IN MG/24 HR) 0.2 mg/24hr patch Place 0.2 mg onto the skin once a week. Sunday/Mondays.      . cycloSPORINE (RESTASIS) 0.05 % ophthalmic emulsion Place 1 drop into both eyes 2 (two) times daily.       Marland Kitchen gabapentin (NEURONTIN) 100 MG capsule Take 1 capsule (100 mg total) by mouth 3 (three) times daily.  90 capsule  5  .  hydrochlorothiazide (HYDRODIURIL) 25 MG tablet Take 25 mg by mouth daily.      Marland Kitchen ibuprofen (ADVIL,MOTRIN) 200 MG tablet Take 400 mg by mouth every 6 (six) hours as needed for mild pain or moderate pain.      Marland Kitchen labetalol (NORMODYNE) 300 MG tablet Take 300 mg by mouth 2 (two) times daily.      Marland Kitchen levothyroxine (SYNTHROID, LEVOTHROID) 125 MCG tablet Take 1 tablet (125 mcg total) by mouth daily before breakfast.  30 tablet  1  . metFORMIN (GLUCOPHAGE) 500 MG tablet Take 500 mg by mouth 2 (two) times daily with a meal.      . pravastatin (PRAVACHOL) 80 MG tablet Take 40 mg by mouth at bedtime.      . vitamin B-12 (CYANOCOBALAMIN) 500 MCG tablet Take 500 mcg by mouth daily.       Review Of Systems: 12 point ROS negative except as noted above in HPI.  Physical Exam: Filed Vitals:   11/03/13 2331  BP: 128/88  Pulse: 127  Temp: 98.7 F (37.1 C)  Resp: 17    General: alert, cooperative and moderately obese HEENT: PERRLA and extra ocular movement intact Heart: tachycardic, no murmur  Lungs: clear to auscultation, no wheezes or rales and unlabored breathing Abdomen: abdomen is soft without significant tenderness, masses, organomegaly or guarding Extremities: extremities normal, atraumatic, no cyanosis or edema Skin:no rashes, no ecchymoses Neurology: normal without focal findings  Labs and Imaging: Lab Results  Component Value Date/Time   NA 140 11/03/2013  8:14 PM   NA 144 10/19/2013  9:00 AM   K 4.1 11/03/2013  8:14 PM   K 4.0 10/19/2013  9:00 AM   CL 100 11/03/2013  8:14 PM   CO2 23 11/03/2013  8:14 PM   CO2 28 10/19/2013  9:00 AM   BUN 18 11/03/2013  8:14 PM   BUN 17.6 10/19/2013  9:00 AM   CREATININE 1.11* 11/03/2013  8:14 PM   CREATININE 1.2* 10/19/2013  9:00 AM   CREATININE 1.28* 05/06/2013 11:49 AM   GLUCOSE 143* 11/03/2013  8:14  PM   GLUCOSE 155* 10/19/2013  9:00 AM   Lab Results  Component Value Date   WBC 5.6 11/03/2013   HGB 11.7* 11/03/2013   HCT 35.9* 11/03/2013   MCV  80.1 11/03/2013   PLT 224 11/03/2013    Ct Angio Chest Pe W/cm &/or Wo Cm  11/03/2013   CLINICAL DATA:  Mid chest pain starting 3 p.m. today, shortness of Breath  EXAM: CT ANGIOGRAPHY CHEST WITH CONTRAST  TECHNIQUE: Multidetector CT imaging of the chest was performed using the standard protocol during bolus administration of intravenous contrast. Multiplanar CT image reconstructions and MIPs were obtained to evaluate the vascular anatomy.  CONTRAST:  155mL OMNIPAQUE IOHEXOL 350 MG/ML SOLN  COMPARISON:  02/26/2011  FINDINGS: There is significant skin thickening right breast measures up to 1 cm. Inflammatory or neoplastic disease cannot be excluded. Clinical correlation is necessary.  Sagittal images of the spine shows degenerative changes thoracic spine. No destructive bony lesions are noted.  Surgical clips are noted at the level of thoracic inlet probable post thyroid surgery. Central airways are patent. No aortic aneurysm. Mild atherosclerotic calcifications of thoracic aorta. There is cardiomegaly.  No pulmonary embolus is identified.  The visualized upper abdomen shows no adrenal gland mass.  Images of the lung parenchyma shows no acute infiltrate or pulmonary edema. No pulmonary nodules are noted. There is no evidence of pneumothorax.  There is no mediastinal hematoma or adenopathy. No hilar adenopathy. No bronchiectasis.  Review of the MIP images confirms the above findings.  IMPRESSION: 1. No pulmonary embolus is noted. 2. No acute infiltrate or pulmonary edema. No mediastinal hematoma or adenopathy. 3. Significant skin thickening right breast up to 1 cm. Inflammatory or neoplastic metastatic disease cannot be excluded. Clinical correlation is necessary. 4. Degenerative changes thoracic spine.   Electronically Signed   By: Lahoma Crocker M.D.   On: 11/03/2013 22:33   Dg Chest Port 1 View  11/03/2013   CLINICAL DATA:  Chest pain and shortness of breath for 1 day  EXAM: PORTABLE CHEST - 1 VIEW   COMPARISON:  September 06, 2013  FINDINGS: The heart size and mediastinal contours are stable. Right central venous line is unchanged. Surgical clips are identified in the superior mediastinum. There is no focal infiltrate, pulmonary edema, or pleural effusion. The visualized skeletal structures are stable.  IMPRESSION: No active cardiopulmonary disease.   Electronically Signed   By: Abelardo Diesel M.D.   On: 11/03/2013 20:30           Shanda Howells MD  Pager: [phone]

## 2013-11-03 NOTE — ED Notes (Signed)
Pt c/o of itching at IV site with Cardizem; PA aware. Cardizem stopped

## 2013-11-03 NOTE — Consult Note (Signed)
CARDIOLOGY CONSULT NOTE   Patient ID: [name] MRN: [id number], DOB/AGE: Jan 08, 1956   Admit date: 11/03/2013 Date of Consult: 11/03/2013   Primary Physician: Rubbie Battiest, MD Primary Cardiologist: [facility] - Cardio-oncology  CC: Tachycardia  Problem List  Past Medical History  Diagnosis Date  . Hyperlipidemia   . Obesity   . Asthma     prn neb. and inhaler  . Osteoarthritis 2003    left knee  . Non-insulin dependent type 2 diabetes mellitus   . Graves' disease   . Ophthalmic manifestation of Graves disease   . Hypertension     on multiple meds., has been on med. > 14 yr.  . Dehydration 04/13/2013  . Breast cancer 01/2013    right  . Wears glasses   . GERD (gastroesophageal reflux disease)     Past Surgical History  Procedure Laterality Date  . Total knee arthroplasty Left 2003  . Total thyroidectomy    . Colonoscopy  2007  . Tubal ligation  1985  . Colonoscopy w/ polypectomy  2009  . Portacath placement Right 02/17/2013    Procedure: INSERTION PORT-A-CATH;  Surgeon: Joyice Faster. Cornett, MD;  Location: [facility];  Service: General;  Laterality: Right;     Allergies  Allergies  Allergen Reactions  . Procardia [Nifedipine] Other (See Comments)    MIGRAINES    HPI  The patient is a 70F with a history of stage IIa breast CA currently receiving radiation therapy and herceptin who was in her USOH this AM. Around 3:30pm she complained of some indigestion, and shortly thereafter, as she walked from her recliner to the sofa she felt heart palpitations and presyncope. This was the first time that she has ever experienced this sensation so she sat back on her recliner. She measured her VS and reports SBP in the 130s but HR in the 120s so she presented to the ED.   In the ED, she was observed to have a rapid irregular narrow complex tachycardia between 170-200bpm though this was not captured on telemetry. She was initially started on a diltiazem gtt but  developed pruritus so she was switched to esmolol with improvement in her HR to the 120s. Currently she is asymptomatic when lying down. She underwent CT-PA that did not demonstrate evidence of PE. We are consulted for evaluation of her SVT.  Risk factors: HTN: Y HLD: Y DM: N Smoker: N Prior cardiac history: Reports cath but no disease in early 45's  Inpatient Medications  . metoprolol  5 mg Intravenous Once    Family History Family History  Problem Relation Age of Onset  . Heart disease Father   . Lung cancer Father   . Diabetes Mother   . Diabetes Sister   . Hypertension Sister   . Asthma Sister      Social History History   Social History  . Marital Status: Divorced    Spouse Name: N/A    Number of Children: 2  . Years of Education: N/A   Occupational History  . Disability    Social History Main Topics  . Smoking status: Former Smoker -- 0.25 packs/day for 20 years    Quit date: 02/10/1991  . Smokeless tobacco: Never Used  . Alcohol Use: No  . Drug Use: No  . Sexual Activity: No   Other Topics Concern  . Not on file   Social History Narrative   Lives in [place]  Review of Systems  General:  No chills, fever, night sweats or weight changes.  Cardiovascular:  No chest pain, + dyspnea on exertion during the episode. No edema, orthopnea, palpitations, paroxysmal nocturnal dyspnea. Dermatological: No rash, lesions/masses Respiratory: No cough, +dyspnea Urologic: No hematuria, dysuria Abdominal:   No nausea, vomiting, diarrhea, bright red blood per rectum, melena, or hematemesis Neurologic:  No visual changes, wkns, changes in mental status. No evidence of bleeding All other systems reviewed and are otherwise negative except as noted above.  Physical Exam  Blood pressure 151/100, pulse 124, resp. rate 16, weight 251 lb 15.8 oz (114.3 kg), SpO2 99.00%.  General: Pleasant, NAD, obese Psych: Normal affect. Neuro: Alert and oriented X 3.  Moves all extremities spontaneously. HEENT: Significant proptosis of both eyes with mild lid lag Neck: Supple without bruits or JVD. Lungs:  Resp regular and unlabored, CTA. Heart: regular tachycardic no s3, s4, or murmurs. Abdomen: Soft, non-tender, non-distended, BS + x 4.  Extremities: No clubbing, cyanosis or edema. DP/PT/Radials 2+ and equal bilaterally.  Labs  No results found for this basename: CKTOTAL, CKMB, TROPONINI,  in the last 72 hours Lab Results  Component Value Date   WBC 5.6 11/03/2013   HGB 11.7* 11/03/2013   HCT 35.9* 11/03/2013   MCV 80.1 11/03/2013   PLT 224 11/03/2013    Recent Labs Lab 11/03/13 2014  NA 140  K 4.1  CL 100  CO2 23  BUN 18  CREATININE 1.11*  CALCIUM 9.9  GLUCOSE 143*   Lab Results  Component Value Date   CHOL 121 04/13/2013   HDL 45 04/13/2013   LDLCALC 32 04/13/2013   TRIG 219* 04/13/2013   No results found for this basename: DDIMER    Radiology/Studies  Ct Angio Chest Pe W/cm &/or Wo Cm  11/03/2013   CLINICAL DATA:  Mid chest pain starting 3 p.m. today, shortness of Breath  EXAM: CT ANGIOGRAPHY CHEST WITH CONTRAST  TECHNIQUE: Multidetector CT imaging of the chest was performed using the standard protocol during bolus administration of intravenous contrast. Multiplanar CT image reconstructions and MIPs were obtained to evaluate the vascular anatomy.  CONTRAST:  166mL OMNIPAQUE IOHEXOL 350 MG/ML SOLN  COMPARISON:  02/26/2011  FINDINGS: There is significant skin thickening right breast measures up to 1 cm. Inflammatory or neoplastic disease cannot be excluded. Clinical correlation is necessary.  Sagittal images of the spine shows degenerative changes thoracic spine. No destructive bony lesions are noted.  Surgical clips are noted at the level of thoracic inlet probable post thyroid surgery. Central airways are patent. No aortic aneurysm. Mild atherosclerotic calcifications of thoracic aorta. There is cardiomegaly.  No pulmonary embolus is  identified.  The visualized upper abdomen shows no adrenal gland mass.  Images of the lung parenchyma shows no acute infiltrate or pulmonary edema. No pulmonary nodules are noted. There is no evidence of pneumothorax.  There is no mediastinal hematoma or adenopathy. No hilar adenopathy. No bronchiectasis.  Review of the MIP images confirms the above findings.  IMPRESSION: 1. No pulmonary embolus is noted. 2. No acute infiltrate or pulmonary edema. No mediastinal hematoma or adenopathy. 3. Significant skin thickening right breast up to 1 cm. Inflammatory or neoplastic metastatic disease cannot be excluded. Clinical correlation is necessary. 4. Degenerative changes thoracic spine.   Electronically Signed   By: Lahoma Crocker M.D.   On: 11/03/2013 22:33   Dg Chest Port 1 View  11/03/2013   CLINICAL DATA:  Chest pain and shortness of breath for  1 day  EXAM: PORTABLE CHEST - 1 VIEW  COMPARISON:  September 06, 2013  FINDINGS: The heart size and mediastinal contours are stable. Right central venous line is unchanged. Surgical clips are identified in the superior mediastinum. There is no focal infiltrate, pulmonary edema, or pleural effusion. The visualized skeletal structures are stable.  IMPRESSION: No active cardiopulmonary disease.   Electronically Signed   By: Abelardo Diesel M.D.   On: 11/03/2013 20:30    ECG Narrow regular complex tachycardia at 120bpm. Question EAT versus AFL (some features of F-waves in III)  ECHO: 10/12/2013 ------------------------------------------------------------------- Study Conclusions  - Left ventricle: The cavity size was normal. Wall thickness was increased in a pattern of moderate LVH. Systolic function was normal. The estimated ejection fraction was in the range of 60% to 65%. - Left atrium: The atrium was mildly dilated. - Right atrium: The atrium was mildly dilated.    ASSESSMENT AND PLAN The patient is a 28F with a history of stage IIa breast CA currently receiving  radiation therapy and herceptin presenting with shortness of breath and narrow complex tachycardia concerning for SVT.  #SVT: She appears to have had multiple rhythms during her ED stay. She was reported to have very rapid AF-RVR though this was not documented on telemetry. She now is in a regular narrow complex rhythm that is ST vs ectopic AT. She is asymptomatic despite high heart rate. At this point would favor rate control and continuous monitoring on telemetry to delineate precise rhythm. If there is another episode of documented AF or AFL she might benefit from anticoagulation given CHADS-VASC = 2-3 (HTN, female, ?vasc disease) placing her at increased stroke risk. -Telemetry bed -Please give her IV lopressor 5mg  IV x 1, then start metoprolol 25mg  q12 hours -If she is still symptomatic and poorly controlled can restart esmolol -Limited utility of TTE at this time given one <1 month ago. Likelihood of effusion or tamponade appears low -Check TSH (facial features of hyperthyroidism), A1c  Signed, Raliegh Ip, MD MPH 11/03/2013, 11:25 PM

## 2013-11-03 NOTE — ED Notes (Signed)
CT aware of power port accessed. Awaiting brevibloc from pharmacy

## 2013-11-04 ENCOUNTER — Encounter: Payer: Self-pay | Admitting: *Deleted

## 2013-11-04 ENCOUNTER — Encounter (HOSPITAL_COMMUNITY): Payer: Self-pay | Admitting: *Deleted

## 2013-11-04 ENCOUNTER — Ambulatory Visit: Payer: Medicare Other

## 2013-11-04 DIAGNOSIS — I471 Supraventricular tachycardia: Principal | ICD-10-CM

## 2013-11-04 DIAGNOSIS — E119 Type 2 diabetes mellitus without complications: Secondary | ICD-10-CM

## 2013-11-04 DIAGNOSIS — R0789 Other chest pain: Secondary | ICD-10-CM

## 2013-11-04 DIAGNOSIS — C50411 Malignant neoplasm of upper-outer quadrant of right female breast: Secondary | ICD-10-CM

## 2013-11-04 LAB — COMPREHENSIVE METABOLIC PANEL
ALBUMIN: 3.4 g/dL — AB (ref 3.5–5.2)
ALK PHOS: 85 U/L (ref 39–117)
ALT: 12 U/L (ref 0–35)
AST: 16 U/L (ref 0–37)
Anion gap: 11 (ref 5–15)
BILIRUBIN TOTAL: 0.3 mg/dL (ref 0.3–1.2)
BUN: 16 mg/dL (ref 6–23)
CHLORIDE: 103 meq/L (ref 96–112)
CO2: 27 mEq/L (ref 19–32)
Calcium: 8.9 mg/dL (ref 8.4–10.5)
Creatinine, Ser: 1.1 mg/dL (ref 0.50–1.10)
GFR calc Af Amer: 62 mL/min — ABNORMAL LOW (ref >90)
GFR calc non Af Amer: 54 mL/min — ABNORMAL LOW (ref >90)
Glucose, Bld: 132 mg/dL — ABNORMAL HIGH (ref 70–99)
POTASSIUM: 3.9 meq/L (ref 3.7–5.3)
SODIUM: 141 meq/L (ref 137–147)
Total Protein: 6.7 g/dL (ref 6.0–8.3)

## 2013-11-04 LAB — TSH: TSH: 3.2 u[IU]/mL (ref 0.350–4.500)

## 2013-11-04 LAB — CBC WITH DIFFERENTIAL/PLATELET
Basophils Absolute: 0 10*3/uL (ref 0.0–0.1)
Basophils Relative: 0 % (ref 0–1)
EOS PCT: 3 % (ref 0–5)
Eosinophils Absolute: 0.1 10*3/uL (ref 0.0–0.7)
HCT: 33.2 % — ABNORMAL LOW (ref 36.0–46.0)
Hemoglobin: 10.9 g/dL — ABNORMAL LOW (ref 12.0–15.0)
LYMPHS ABS: 1.1 10*3/uL (ref 0.7–4.0)
Lymphocytes Relative: 27 % (ref 12–46)
MCH: 26.5 pg (ref 26.0–34.0)
MCHC: 32.8 g/dL (ref 30.0–36.0)
MCV: 80.6 fL (ref 78.0–100.0)
Monocytes Absolute: 0.5 10*3/uL (ref 0.1–1.0)
Monocytes Relative: 11 % (ref 3–12)
Neutro Abs: 2.6 10*3/uL (ref 1.7–7.7)
Neutrophils Relative %: 59 % (ref 43–77)
Platelets: 204 10*3/uL (ref 150–400)
RBC: 4.12 MIL/uL (ref 3.87–5.11)
RDW: 16.6 % — ABNORMAL HIGH (ref 11.5–15.5)
WBC: 4.3 10*3/uL (ref 4.0–10.5)

## 2013-11-04 LAB — CBG MONITORING, ED: GLUCOSE-CAPILLARY: 126 mg/dL — AB (ref 70–99)

## 2013-11-04 LAB — PRO B NATRIURETIC PEPTIDE: PRO B NATRI PEPTIDE: 732.5 pg/mL — AB (ref 0–125)

## 2013-11-04 LAB — CREATININE, SERUM
Creatinine, Ser: 1.07 mg/dL (ref 0.50–1.10)
GFR, EST AFRICAN AMERICAN: 65 mL/min — AB (ref >90)
GFR, EST NON AFRICAN AMERICAN: 56 mL/min — AB (ref >90)

## 2013-11-04 LAB — GLUCOSE, CAPILLARY
GLUCOSE-CAPILLARY: 127 mg/dL — AB (ref 70–99)
GLUCOSE-CAPILLARY: 143 mg/dL — AB (ref 70–99)
GLUCOSE-CAPILLARY: 158 mg/dL — AB (ref 70–99)
Glucose-Capillary: 102 mg/dL — ABNORMAL HIGH (ref 70–99)

## 2013-11-04 LAB — CBC
HEMATOCRIT: 34.6 % — AB (ref 36.0–46.0)
Hemoglobin: 11.3 g/dL — ABNORMAL LOW (ref 12.0–15.0)
MCH: 26.8 pg (ref 26.0–34.0)
MCHC: 32.7 g/dL (ref 30.0–36.0)
MCV: 82 fL (ref 78.0–100.0)
Platelets: 210 10*3/uL (ref 150–400)
RBC: 4.22 MIL/uL (ref 3.87–5.11)
RDW: 16.6 % — ABNORMAL HIGH (ref 11.5–15.5)
WBC: 4.9 10*3/uL (ref 4.0–10.5)

## 2013-11-04 LAB — TROPONIN I
Troponin I: <0.3 ng/mL (ref <0.30)
Troponin I: <0.3 ng/mL (ref <0.30)

## 2013-11-04 MED ORDER — METOPROLOL TARTRATE 50 MG PO TABS
50.0000 mg | ORAL_TABLET | Freq: Three times a day (TID) | ORAL | Status: DC
  Administered 2013-11-04 – 2013-11-05 (×3): 50 mg via ORAL
  Filled 2013-11-04 (×6): qty 1

## 2013-11-04 MED ORDER — METOPROLOL TARTRATE 1 MG/ML IV SOLN
5.0000 mg | Freq: Once | INTRAVENOUS | Status: AC
  Administered 2013-11-04: 5 mg via INTRAVENOUS
  Filled 2013-11-04: qty 5

## 2013-11-04 MED ORDER — ACETAMINOPHEN 325 MG PO TABS
650.0000 mg | ORAL_TABLET | Freq: Four times a day (QID) | ORAL | Status: DC | PRN
  Administered 2013-11-04 – 2013-11-09 (×6): 650 mg via ORAL
  Filled 2013-11-04 (×6): qty 2

## 2013-11-04 MED ORDER — ASPIRIN 81 MG PO CHEW
81.0000 mg | CHEWABLE_TABLET | Freq: Every day | ORAL | Status: DC
  Administered 2013-11-04 – 2013-11-07 (×4): 81 mg via ORAL
  Filled 2013-11-04 (×6): qty 1

## 2013-11-04 MED ORDER — METOPROLOL TARTRATE 25 MG PO TABS
25.0000 mg | ORAL_TABLET | Freq: Two times a day (BID) | ORAL | Status: DC
Start: 2013-11-04 — End: 2013-11-04
  Administered 2013-11-04 (×2): 25 mg via ORAL
  Filled 2013-11-04 (×3): qty 1

## 2013-11-04 NOTE — Progress Notes (Signed)
[facility] PROGRESS NOTE  [name] PFX:[id number] DOB: [birth year] DOA: 11/03/2013  PCP: Rubbie Battiest, MD  Brief HPI: 58yo with PMH as below presented with chest pain and narrow complex tachycardia. On Trastuzumab for breast cancer which has cardiac side effects.  Past medical history:  Past Medical History  Diagnosis Date  . Hyperlipidemia   . Obesity   . Asthma     prn neb. and inhaler  . Osteoarthritis 2003    left knee  . Non-insulin dependent type 2 diabetes mellitus   . Graves' disease   . Ophthalmic manifestation of Graves disease   . Hypertension     on multiple meds., has been on med. > 14 yr.  . Dehydration 04/13/2013  . Breast cancer 01/2013    right  . Wears glasses   . GERD (gastroesophageal reflux disease)     Consultants: Cardiology  Procedures: None  Antibiotics: None  Subjective: Patient feels well. She denies any chest pain or shortness of breath. No nausea or vomiting. She gets radiation treatment for breast cancer.  Objective: Vital Signs  Filed Vitals:   11/03/13 2331 11/04/13 0122 11/04/13 0553 11/04/13 1338  BP: 128/88 145/94 126/88 142/91  Pulse: 127 129 125 129  Temp: 98.7 F (37.1 C) 97.4 F (36.3 C) 98.6 F (37 C) 99 F (37.2 C)  TempSrc: Oral Oral Oral Oral  Resp: 17 18 18 19   Height:  5' 4.5" (1.638 m)    Weight:  115.577 kg (254 lb 12.8 oz)    SpO2: 99% 99% 100% 100%    Intake/Output Summary (Last 24 hours) at 11/04/13 1431 Last data filed at 11/04/13 0900  Gross per 24 hour  Intake    120 ml  Output    400 ml  Net   -280 ml   Filed Weights   11/03/13 2100 11/04/13 0122  Weight: 114.3 kg (251 lb 15.8 oz) 115.577 kg (254 lb 12.8 oz)    General appearance: alert, cooperative, appears stated age, no distress and moderately obese Resp: clear to auscultation bilaterally Cardio: s1s2 tachy regular, no s3s4. no rubs, bruits. systolic murmur at apex. GI: soft, non-tender; bowel sounds normal; no masses,  no  organomegaly Extremities: extremities normal, atraumatic, no cyanosis or edema Neurologic: no focal deficits.  Lab Results:  Basic Metabolic Panel:  Recent Labs Lab 11/03/13 2014 11/04/13 0019 11/04/13 0513  NA 140  --  141  K 4.1  --  3.9  CL 100  --  103  CO2 23  --  27  GLUCOSE 143*  --  132*  BUN 18  --  16  CREATININE 1.11* 1.07 1.10  CALCIUM 9.9  --  8.9   Liver Function Tests:  Recent Labs Lab 11/04/13 0513  AST 16  ALT 12  ALKPHOS 85  BILITOT 0.3  PROT 6.7  ALBUMIN 3.4*   CBC:  Recent Labs Lab 11/03/13 2014 11/04/13 0019 11/04/13 0513  WBC 5.6 4.9 4.3  NEUTROABS 3.8  --  2.6  HGB 11.7* 11.3* 10.9*  HCT 35.9* 34.6* 33.2*  MCV 80.1 82.0 80.6  PLT 224 210 204   Cardiac Enzymes:  Recent Labs Lab 11/04/13 0019 11/04/13 0535 11/04/13 1114  TROPONINI <0.30 <0.30 <0.30   BNP (last 3 results)  Recent Labs  11/04/13 0220  PROBNP 732.5*   CBG:  Recent Labs Lab 11/04/13 0019 11/04/13 0551 11/04/13 1100  GLUCAP 126* 127* 102*    No results found for this or any previous  visit (from the past 240 hour(s)).    Studies/Results: Ct Angio Chest Pe W/cm &/or Wo Cm  11/03/2013   CLINICAL DATA:  Mid chest pain starting 3 p.m. today, shortness of Breath  EXAM: CT ANGIOGRAPHY CHEST WITH CONTRAST  TECHNIQUE: Multidetector CT imaging of the chest was performed using the standard protocol during bolus administration of intravenous contrast. Multiplanar CT image reconstructions and MIPs were obtained to evaluate the vascular anatomy.  CONTRAST:  147mL OMNIPAQUE IOHEXOL 350 MG/ML SOLN  COMPARISON:  02/26/2011  FINDINGS: There is significant skin thickening right breast measures up to 1 cm. Inflammatory or neoplastic disease cannot be excluded. Clinical correlation is necessary.  Sagittal images of the spine shows degenerative changes thoracic spine. No destructive bony lesions are noted.  Surgical clips are noted at the level of thoracic inlet probable post  thyroid surgery. Central airways are patent. No aortic aneurysm. Mild atherosclerotic calcifications of thoracic aorta. There is cardiomegaly.  No pulmonary embolus is identified.  The visualized upper abdomen shows no adrenal gland mass.  Images of the lung parenchyma shows no acute infiltrate or pulmonary edema. No pulmonary nodules are noted. There is no evidence of pneumothorax.  There is no mediastinal hematoma or adenopathy. No hilar adenopathy. No bronchiectasis.  Review of the MIP images confirms the above findings.  IMPRESSION: 1. No pulmonary embolus is noted. 2. No acute infiltrate or pulmonary edema. No mediastinal hematoma or adenopathy. 3. Significant skin thickening right breast up to 1 cm. Inflammatory or neoplastic metastatic disease cannot be excluded. Clinical correlation is necessary. 4. Degenerative changes thoracic spine.   Electronically Signed   By: Lahoma Crocker M.D.   On: 11/03/2013 22:33   Dg Chest Port 1 View  11/03/2013   CLINICAL DATA:  Chest pain and shortness of breath for 1 day  EXAM: PORTABLE CHEST - 1 VIEW  COMPARISON:  September 06, 2013  FINDINGS: The heart size and mediastinal contours are stable. Right central venous line is unchanged. Surgical clips are identified in the superior mediastinum. There is no focal infiltrate, pulmonary edema, or pleural effusion. The visualized skeletal structures are stable.  IMPRESSION: No active cardiopulmonary disease.   Electronically Signed   By: Abelardo Diesel M.D.   On: 11/03/2013 20:30    Medications:  Scheduled: . aspirin  81 mg Oral Daily  . gabapentin  100 mg Oral TID  . heparin  5,000 Units Subcutaneous 3 times per day  . insulin aspart  0-9 Units Subcutaneous 6 times per day  . levothyroxine  125 mcg Oral QAC breakfast  . metoprolol tartrate  50 mg Oral TID  . pravastatin  40 mg Oral QHS  . sodium chloride  3 mL Intravenous Q12H   Continuous: . sodium chloride 75 mL/hr at 11/04/13 0130    PRN:  Assessment/Plan:  Active Problems:   Diabetes mellitus, type 2   Breast cancer of upper-outer quadrant of right female breast   Hypothyroidism   Narrow complex tachycardia   Chest pain    Chest Pain/Narrow Complex Tachycardia  Cardiology following and managing. Some concern for atrial flutter. On Metoprolol. Esmolol infusion on hold. Had ECHO recently with normal systolic function. TSH 3.2.  Breast Cancer  Status post s/p lumpectomy in July. Currently undergoing radiation and chemotherapy with Trastuzumab every 3 weeks. Will notify Dr. Jana Hakim and Dr. Pablo Ledger.  Essential HTN  Monitor BP closely.   DM2 SSI. A1C   History of Asthma  Stable.   Normocytic Anemia Hgb is stable.  DVT Prophylaxis: Heparin    Code Status: Full Code  Family Communication: Discussed with patient  Disposition Plan: Not ready for discharge    LOS: 1 day   [facility] Pager [phone] 11/04/2013, 2:31 PM  If 8PM-8AM, please contact night-coverage at [email or web address], password [facility]

## 2013-11-04 NOTE — Progress Notes (Signed)
UR completed 

## 2013-11-04 NOTE — ED Notes (Addendum)
Dr Ernestina Patches called regarding patients "heaviness"  [facility] has seen her, no new EKG changes and with her history of Breast Ca and radiation this is to be expected.  Charge nurse notified

## 2013-11-04 NOTE — Progress Notes (Signed)
Echocardiogram 2D Echocardiogram has been performed.  Joelene Millin 11/04/2013, 11:48 AM

## 2013-11-04 NOTE — Progress Notes (Signed)
11/04/13 at 1:34pm - The research nurse noted that the pt was admitted to the hospital last night.  The research nurse reviewed the pt's admission and determined that the pt's hospitalization was a SAE requiring special reporting.  The research nurse contacted Dionicia Abler, research nurse specialist at [facility].  Lollie Marrow agreed that a SAE report needed to be initiated.  She advised the nurse to not submit the report until more information is available.  The research nurse spoke to Dr. Pablo Ledger, pt's treating radiation oncologist. Dr. Pablo Ledger stated that the pt's grade 3 supraventricular tachycardia was unrelated to the pt's radiation therapy.   The pt is also receiving trastuzumab, and Dr. Pablo Ledger stated that Dr. Jana Hakim will need to give his attribution related to the pt's SVT.  The research nurse also contacted Tiburcio Pea in radiation for help with the pt's radiation therapy information for reporting purposes.   11/08/13 at 10am - The pt is still hospitalized today. The pt's radiation treatments have been put on hold for now.   11/09/13 at 3:33pm-  The pt is supposed to be discharged later today.  The pt is "s/p TEE guided conversion on 11/08/13 with conversion to sinus rhythm".  The pt will be followed by the [facility] office.  Dr. Jana Hakim stated that the pt's prior medication, pertuzumab, may be a "possible" cause of the pt's cardiac problems.  The research nurse will submit the pt's SAE report once the pt is officially discharged.  11/14/13- at 11:23am - The pt resumed her radiation treatments on 11/11/13.  The research nurse consulted the pt's cardiologist to see if he felt the pt's condition was caused by other factors.  Dr. Haroldine Laws stated that he did not think her SVT was related to her chemotherapy or her biologics.  Dr. Haroldine Laws did not provide any other causes for this pt's hospitalization.  The research nurse met with Dr. Jana Hakim, the pt's medical oncologist, this  morning to discuss the pt's CTEP report.  He confirmed that the "atrial flutter" was the primary AE, and he felt that the SVT was a secondary AE.  He also confirmed that the pt's past medication, pertuzumab, was a "possible" cause of the pt's atrial flutter and her SVT.  Dr. Jana Hakim signed and dated a copy of the pt's attribution list for reference.  The research nurse thanked Dr. Jana Hakim for his assistance in completing the report.   11/14/13 at 5:00pm - The pt's CTEP-AERS Expedited Report was successfully submitted to the [employer].  The pt's supporting documentation will be faxed to Dionicia Abler, [facility] research nurse specialist, at [phone].   Jerline Pain, regulatory assistant, was notified of the submitted report.  The regulatory assistant was also given a copy of the report for IRB submission.

## 2013-11-04 NOTE — Progress Notes (Signed)
Patient Profile: 68F with a history of stage IIa breast CA currently receiving radiation therapy and herceptin, followed by Dr. Haroldine Laws in [place] [facility], Graves disease, T2DM and HTN. She had a cardiac cath in 1995 which showed normal coronaries. 2D echo 10/12/13 demonstrated normal LVF with EF of 60-65%. She presented 11/03/13 with shortness of breath and narrow complex tachycardia concerning for SVT.   TSH normal at 3.200 CT negative for PE Troponin negative x 2   Subjective: Symptoms improved. No further dyspnea. Denies chest pain and palpitations.  Objective: Vital signs in last 24 hours: Temp:  [97.4 F (36.3 C)-98.7 F (37.1 C)] 98.6 F (37 C) (10/23 0553) Pulse Rate:  [120-132] 125 (10/23 0553) Resp:  [16-20] 18 (10/23 0553) BP: (126-151)/(88-100) 126/88 mmHg (10/23 0553) SpO2:  [98 %-100 %] 100 % (10/23 0553) Weight:  [251 lb 15.8 oz (114.3 kg)-254 lb 12.8 oz (115.577 kg)] 254 lb 12.8 oz (115.577 kg) (10/23 0122) Last BM Date: 11/03/13  Intake/Output from previous day: 10/22 0701 - 10/23 0700 In: -  Out: 400 [Urine:400] Intake/Output this shift:    Medications Current Facility-Administered Medications  Medication Dose Route Frequency Provider Last Rate Last Dose  . 0.9 %  sodium chloride infusion   Intravenous Continuous Shanda Howells, MD 75 mL/hr at 11/04/13 0130    . aspirin chewable tablet 81 mg  81 mg Oral Daily Shanda Howells, MD      . gabapentin (NEURONTIN) capsule 100 mg  100 mg Oral TID Shanda Howells, MD   100 mg at 11/04/13 0202  . heparin injection 5,000 Units  5,000 Units Subcutaneous 3 times per day Shanda Howells, MD   5,000 Units at 11/04/13 0201  . insulin aspart (novoLOG) injection 0-9 Units  0-9 Units Subcutaneous 6 times per day Shanda Howells, MD   1 Units at 11/04/13 0847  . levothyroxine (SYNTHROID, LEVOTHROID) tablet 125 mcg  125 mcg Oral QAC breakfast Shanda Howells, MD   125 mcg at 11/04/13 [phone]  . metoprolol tartrate (LOPRESSOR) tablet  25 mg  25 mg Oral BID Shanda Howells, MD   25 mg at 11/04/13 0255  . pravastatin (PRAVACHOL) tablet 40 mg  40 mg Oral QHS Shanda Howells, MD   40 mg at 11/04/13 0202  . sodium chloride 0.9 % injection 3 mL  3 mL Intravenous Q12H Shanda Howells, MD   3 mL at 11/04/13 0202    PE: General appearance: alert, cooperative, no distress and moderately obese Lungs: clear to auscultation bilaterally Heart: regular rate and rhythm Extremities: no LEE Pulses: 2+ and symmetric Skin: warm and dry Neurologic: Grossly normal  Lab Results:   Recent Labs  11/03/13 2014 11/04/13 0019 11/04/13 0513  WBC 5.6 4.9 4.3  HGB 11.7* 11.3* 10.9*  HCT 35.9* 34.6* 33.2*  PLT 224 210 204   BMET  Recent Labs  11/03/13 2014 11/04/13 0019 11/04/13 0513  NA 140  --  141  K 4.1  --  3.9  CL 100  --  103  CO2 23  --  27  GLUCOSE 143*  --  132*  BUN 18  --  16  CREATININE 1.11* 1.07 1.10  CALCIUM 9.9  --  8.9   Cardiac Panel (last 3 results)  Recent Labs  11/04/13 0019 11/04/13 0535  TROPONINI <0.30 <0.30    Assessment/Plan  Active Problems:   Narrow complex tachycardia   Chest pain  1. Narrow complex tachycardia/?SVT: w/u thus far has been unremarkable, including normal TSH, negative cardiac enzymes  x 2, no evidence for PE on chest CT, normal CXR, normal BMP and only mild anemia w/ hgb 10-11. White count is normal and she is afebrile. No recent alchohol intake and only mild caffeine consumption in the last 24-36 hr (1 cup of regular coffee and 1 medium Dr. Malachi Bonds). 2D echo less than 1 month ago demonstrated normal EF of 60-65%. Repeat 2 Echo pending.  She remains in sinus tach with resting HR in the 130s. Symptoms improved from yesterday when HR reached 170-200. Despite her persistent tachycardia, she remains hemodynamically stable with SBP in the 120s-150s. O2 sats 98% on RA.  She is currently on 25 mg of Lopressor BID. May need further titration to 50 mg BID. ? If addition of Cardizem is safe  with herceptin or if the combo would potentially increase her risk for cardiomyopathy.     LOS: 1 day    [name] 11/04/2013 8:51 AM   Patient seen and examined. Agree with assessment and plan. No chest pian. Review of telemetry strips suggest atrial flutter with variable block. HR currently still elevated 130. With normal EF, and morbid obesity will titrate lopressor to 50 mg every 8 hrs. Repeat ECG. Consider evaluation for OSA.   Troy Sine, MD, [facility] 11/04/2013 11:14 AM

## 2013-11-04 NOTE — ED Provider Notes (Signed)
Medical screening examination/treatment/procedure(s) were conducted as a shared visit with non-physician practitioner(s) and myself.  I personally evaluated the patient during the encounter  Please see my separate respective documentation pertaining to this patient encounter   Johnna Acosta, MD 11/04/13 [phone]

## 2013-11-05 ENCOUNTER — Other Ambulatory Visit: Payer: Self-pay | Admitting: Oncology

## 2013-11-05 DIAGNOSIS — K219 Gastro-esophageal reflux disease without esophagitis: Secondary | ICD-10-CM | POA: Diagnosis present

## 2013-11-05 DIAGNOSIS — N182 Chronic kidney disease, stage 2 (mild): Secondary | ICD-10-CM | POA: Diagnosis present

## 2013-11-05 DIAGNOSIS — Z888 Allergy status to other drugs, medicaments and biological substances status: Secondary | ICD-10-CM | POA: Diagnosis not present

## 2013-11-05 DIAGNOSIS — Z96652 Presence of left artificial knee joint: Secondary | ICD-10-CM | POA: Diagnosis present

## 2013-11-05 DIAGNOSIS — Z853 Personal history of malignant neoplasm of breast: Secondary | ICD-10-CM | POA: Diagnosis not present

## 2013-11-05 DIAGNOSIS — E039 Hypothyroidism, unspecified: Secondary | ICD-10-CM | POA: Diagnosis present

## 2013-11-05 DIAGNOSIS — E119 Type 2 diabetes mellitus without complications: Secondary | ICD-10-CM | POA: Diagnosis present

## 2013-11-05 DIAGNOSIS — I4892 Unspecified atrial flutter: Secondary | ICD-10-CM | POA: Diagnosis present

## 2013-11-05 DIAGNOSIS — I129 Hypertensive chronic kidney disease with stage 1 through stage 4 chronic kidney disease, or unspecified chronic kidney disease: Secondary | ICD-10-CM | POA: Diagnosis present

## 2013-11-05 DIAGNOSIS — K30 Functional dyspepsia: Secondary | ICD-10-CM | POA: Diagnosis present

## 2013-11-05 DIAGNOSIS — I471 Supraventricular tachycardia: Secondary | ICD-10-CM | POA: Diagnosis present

## 2013-11-05 DIAGNOSIS — E669 Obesity, unspecified: Secondary | ICD-10-CM | POA: Diagnosis present

## 2013-11-05 DIAGNOSIS — Z825 Family history of asthma and other chronic lower respiratory diseases: Secondary | ICD-10-CM | POA: Diagnosis not present

## 2013-11-05 DIAGNOSIS — I340 Nonrheumatic mitral (valve) insufficiency: Secondary | ICD-10-CM | POA: Diagnosis present

## 2013-11-05 DIAGNOSIS — E785 Hyperlipidemia, unspecified: Secondary | ICD-10-CM | POA: Diagnosis present

## 2013-11-05 DIAGNOSIS — Z833 Family history of diabetes mellitus: Secondary | ICD-10-CM | POA: Diagnosis not present

## 2013-11-05 DIAGNOSIS — D649 Anemia, unspecified: Secondary | ICD-10-CM | POA: Diagnosis present

## 2013-11-05 DIAGNOSIS — Z6841 Body Mass Index (BMI) 40.0 and over, adult: Secondary | ICD-10-CM | POA: Diagnosis not present

## 2013-11-05 DIAGNOSIS — I4891 Unspecified atrial fibrillation: Secondary | ICD-10-CM | POA: Diagnosis present

## 2013-11-05 DIAGNOSIS — F17210 Nicotine dependence, cigarettes, uncomplicated: Secondary | ICD-10-CM | POA: Diagnosis present

## 2013-11-05 DIAGNOSIS — J45909 Unspecified asthma, uncomplicated: Secondary | ICD-10-CM | POA: Diagnosis present

## 2013-11-05 DIAGNOSIS — Z801 Family history of malignant neoplasm of trachea, bronchus and lung: Secondary | ICD-10-CM | POA: Diagnosis not present

## 2013-11-05 DIAGNOSIS — E0500 Thyrotoxicosis with diffuse goiter without thyrotoxic crisis or storm: Secondary | ICD-10-CM | POA: Diagnosis present

## 2013-11-05 DIAGNOSIS — E0590 Thyrotoxicosis, unspecified without thyrotoxic crisis or storm: Secondary | ICD-10-CM | POA: Diagnosis present

## 2013-11-05 DIAGNOSIS — Z7982 Long term (current) use of aspirin: Secondary | ICD-10-CM | POA: Diagnosis not present

## 2013-11-05 DIAGNOSIS — Z8249 Family history of ischemic heart disease and other diseases of the circulatory system: Secondary | ICD-10-CM | POA: Diagnosis not present

## 2013-11-05 DIAGNOSIS — R079 Chest pain, unspecified: Secondary | ICD-10-CM | POA: Diagnosis present

## 2013-11-05 LAB — HEPARIN LEVEL (UNFRACTIONATED): Heparin Unfractionated: 0.71 IU/mL — ABNORMAL HIGH (ref 0.30–0.70)

## 2013-11-05 LAB — GLUCOSE, CAPILLARY
GLUCOSE-CAPILLARY: 107 mg/dL — AB (ref 70–99)
GLUCOSE-CAPILLARY: 139 mg/dL — AB (ref 70–99)
GLUCOSE-CAPILLARY: 151 mg/dL — AB (ref 70–99)
Glucose-Capillary: 169 mg/dL — ABNORMAL HIGH (ref 70–99)
Glucose-Capillary: 174 mg/dL — ABNORMAL HIGH (ref 70–99)

## 2013-11-05 LAB — CBC
HCT: 35.1 % — ABNORMAL LOW (ref 36.0–46.0)
HEMOGLOBIN: 12.2 g/dL (ref 12.0–15.0)
MCH: 27.7 pg (ref 26.0–34.0)
MCHC: 34.8 g/dL (ref 30.0–36.0)
MCV: 79.6 fL (ref 78.0–100.0)
PLATELETS: 302 10*3/uL (ref 150–400)
RBC: 4.41 MIL/uL (ref 3.87–5.11)
RDW: 16.7 % — ABNORMAL HIGH (ref 11.5–15.5)
WBC: 3.8 10*3/uL — AB (ref 4.0–10.5)

## 2013-11-05 LAB — BASIC METABOLIC PANEL
ANION GAP: 14 (ref 5–15)
BUN: 21 mg/dL (ref 6–23)
CALCIUM: 8.9 mg/dL (ref 8.4–10.5)
CHLORIDE: 104 meq/L (ref 96–112)
CO2: 23 meq/L (ref 19–32)
CREATININE: 1.18 mg/dL — AB (ref 0.50–1.10)
GFR calc Af Amer: 57 mL/min — ABNORMAL LOW (ref >90)
GFR calc non Af Amer: 49 mL/min — ABNORMAL LOW (ref >90)
Glucose, Bld: 132 mg/dL — ABNORMAL HIGH (ref 70–99)
Potassium: 4 mEq/L (ref 3.7–5.3)
SODIUM: 141 meq/L (ref 137–147)

## 2013-11-05 MED ORDER — HEPARIN (PORCINE) IN NACL 100-0.45 UNIT/ML-% IJ SOLN
1000.0000 [IU]/h | INTRAMUSCULAR | Status: DC
  Administered 2013-11-05: 1200 [IU]/h via INTRAVENOUS
  Administered 2013-11-06 – 2013-11-07 (×2): 1000 [IU]/h via INTRAVENOUS
  Filled 2013-11-05 (×5): qty 250

## 2013-11-05 MED ORDER — OXYCODONE HCL 5 MG PO TABS
5.0000 mg | ORAL_TABLET | Freq: Four times a day (QID) | ORAL | Status: DC | PRN
  Administered 2013-11-05 – 2013-11-08 (×6): 5 mg via ORAL
  Filled 2013-11-05 (×6): qty 1

## 2013-11-05 MED ORDER — METOPROLOL TARTRATE 50 MG PO TABS
50.0000 mg | ORAL_TABLET | Freq: Four times a day (QID) | ORAL | Status: DC
  Administered 2013-11-05 – 2013-11-06 (×4): 50 mg via ORAL
  Filled 2013-11-05 (×8): qty 1

## 2013-11-05 MED ORDER — HEPARIN BOLUS VIA INFUSION
3000.0000 [IU] | Freq: Once | INTRAVENOUS | Status: AC
  Administered 2013-11-05: 3000 [IU] via INTRAVENOUS
  Filled 2013-11-05: qty 3000

## 2013-11-05 MED ORDER — INSULIN ASPART 100 UNIT/ML ~~LOC~~ SOLN
0.0000 [IU] | Freq: Three times a day (TID) | SUBCUTANEOUS | Status: DC
  Administered 2013-11-05 (×2): 2 [IU] via SUBCUTANEOUS
  Administered 2013-11-05: 1 [IU] via SUBCUTANEOUS
  Administered 2013-11-05 – 2013-11-06 (×2): 2 [IU] via SUBCUTANEOUS
  Administered 2013-11-06: 1 [IU] via SUBCUTANEOUS
  Administered 2013-11-06: 2 [IU] via SUBCUTANEOUS
  Administered 2013-11-06 – 2013-11-07 (×2): 1 [IU] via SUBCUTANEOUS
  Administered 2013-11-08: 2 [IU] via SUBCUTANEOUS
  Administered 2013-11-08 – 2013-11-09 (×2): 1 [IU] via SUBCUTANEOUS

## 2013-11-05 NOTE — Progress Notes (Signed)
[facility] PROGRESS NOTE  [name] SWH:[id number] DOB: 03-22-1955 DOA: 11/03/2013  PCP: Rubbie Battiest, MD  Brief HPI: 58yo with PMH as below presented with chest pain and narrow complex tachycardia. On Trastuzumab for breast cancer which has cardiac side effects.  Past medical history:  Past Medical History  Diagnosis Date  . Hyperlipidemia   . Obesity   . Asthma     prn neb. and inhaler  . Osteoarthritis 2003    left knee  . Non-insulin dependent type 2 diabetes mellitus   . Graves' disease   . Ophthalmic manifestation of Graves disease   . Hypertension     on multiple meds., has been on med. > 14 yr.  . Dehydration 04/13/2013  . Breast cancer 01/2013    right  . Wears glasses   . GERD (gastroesophageal reflux disease)     Consultants: Cardiology  Procedures: None  Antibiotics: None  Subjective: Patient continues to feel well. She denies any chest pain or shortness of breath. No nausea or vomiting.   Objective: Vital Signs  Filed Vitals:   11/04/13 0553 11/04/13 1338 11/04/13 2002 11/05/13 0617  BP: 126/88 142/91 130/91 112/80  Pulse: 125 129 128 128  Temp: 98.6 F (37 C) 99 F (37.2 C) 98.4 F (36.9 C) 98.2 F (36.8 C)  TempSrc: Oral Oral Oral Oral  Resp: 18 19 18 16   Height:      Weight:    115.304 kg (254 lb 3.2 oz)  SpO2: 100% 100% 97% 99%    Intake/Output Summary (Last 24 hours) at 11/05/13 1050 Last data filed at 11/05/13 1026  Gross per 24 hour  Intake    240 ml  Output    650 ml  Net   -410 ml   Filed Weights   11/03/13 2100 11/04/13 0122 11/05/13 0617  Weight: 114.3 kg (251 lb 15.8 oz) 115.577 kg (254 lb 12.8 oz) 115.304 kg (254 lb 3.2 oz)    General appearance: alert, cooperative, appears stated age, no distress and moderately obese Resp: clear to auscultation bilaterally Cardio: s1s2 tachy regular, no s3s4. no rubs, bruits. systolic murmur at apex. GI: soft, non-tender; bowel sounds normal; no masses,  no  organomegaly Extremities: extremities normal, atraumatic, no cyanosis or edema Neurologic: no focal deficits.  Lab Results:  Basic Metabolic Panel:  Recent Labs Lab 11/03/13 2014 11/04/13 0019 11/04/13 0513 11/05/13 0305  NA 140  --  141 141  K 4.1  --  3.9 4.0  CL 100  --  103 104  CO2 23  --  27 23  GLUCOSE 143*  --  132* 132*  BUN 18  --  16 21  CREATININE 1.11* 1.07 1.10 1.18*  CALCIUM 9.9  --  8.9 8.9   Liver Function Tests:  Recent Labs Lab 11/04/13 0513  AST 16  ALT 12  ALKPHOS 85  BILITOT 0.3  PROT 6.7  ALBUMIN 3.4*   CBC:  Recent Labs Lab 11/03/13 2014 11/04/13 0019 11/04/13 0513 11/05/13 0305  WBC 5.6 4.9 4.3 3.8*  NEUTROABS 3.8  --  2.6  --   HGB 11.7* 11.3* 10.9* 12.2  HCT 35.9* 34.6* 33.2* 35.1*  MCV 80.1 82.0 80.6 79.6  PLT 224 210 204 302   Cardiac Enzymes:  Recent Labs Lab 11/04/13 0019 11/04/13 0535 11/04/13 1114  TROPONINI <0.30 <0.30 <0.30   BNP (last 3 results)  Recent Labs  11/04/13 0220  PROBNP 732.5*   CBG:  Recent Labs Lab 11/04/13  1100 11/04/13 1609 11/04/13 2001 11/05/13 0011 11/05/13 0621  GLUCAP 102* 143* 158* 107* 139*    No results found for this or any previous visit (from the past 240 hour(s)).    Studies/Results: Ct Angio Chest Pe W/cm &/or Wo Cm  11/03/2013   CLINICAL DATA:  Mid chest pain starting 3 p.m. today, shortness of Breath  EXAM: CT ANGIOGRAPHY CHEST WITH CONTRAST  TECHNIQUE: Multidetector CT imaging of the chest was performed using the standard protocol during bolus administration of intravenous contrast. Multiplanar CT image reconstructions and MIPs were obtained to evaluate the vascular anatomy.  CONTRAST:  172mL OMNIPAQUE IOHEXOL 350 MG/ML SOLN  COMPARISON:  02/26/2011  FINDINGS: There is significant skin thickening right breast measures up to 1 cm. Inflammatory or neoplastic disease cannot be excluded. Clinical correlation is necessary.  Sagittal images of the spine shows degenerative  changes thoracic spine. No destructive bony lesions are noted.  Surgical clips are noted at the level of thoracic inlet probable post thyroid surgery. Central airways are patent. No aortic aneurysm. Mild atherosclerotic calcifications of thoracic aorta. There is cardiomegaly.  No pulmonary embolus is identified.  The visualized upper abdomen shows no adrenal gland mass.  Images of the lung parenchyma shows no acute infiltrate or pulmonary edema. No pulmonary nodules are noted. There is no evidence of pneumothorax.  There is no mediastinal hematoma or adenopathy. No hilar adenopathy. No bronchiectasis.  Review of the MIP images confirms the above findings.  IMPRESSION: 1. No pulmonary embolus is noted. 2. No acute infiltrate or pulmonary edema. No mediastinal hematoma or adenopathy. 3. Significant skin thickening right breast up to 1 cm. Inflammatory or neoplastic metastatic disease cannot be excluded. Clinical correlation is necessary. 4. Degenerative changes thoracic spine.   Electronically Signed   By: Lahoma Crocker M.D.   On: 11/03/2013 22:33   Dg Chest Port 1 View  11/03/2013   CLINICAL DATA:  Chest pain and shortness of breath for 1 day  EXAM: PORTABLE CHEST - 1 VIEW  COMPARISON:  September 06, 2013  FINDINGS: The heart size and mediastinal contours are stable. Right central venous line is unchanged. Surgical clips are identified in the superior mediastinum. There is no focal infiltrate, pulmonary edema, or pleural effusion. The visualized skeletal structures are stable.  IMPRESSION: No active cardiopulmonary disease.   Electronically Signed   By: Abelardo Diesel M.D.   On: 11/03/2013 20:30    Medications:  Scheduled: . aspirin  81 mg Oral Daily  . gabapentin  100 mg Oral TID  . heparin  5,000 Units Subcutaneous 3 times per day  . insulin aspart  0-9 Units Subcutaneous TID WC & HS  . levothyroxine  125 mcg Oral QAC breakfast  . metoprolol tartrate  50 mg Oral 4 times per day  . pravastatin  40 mg Oral  QHS  . sodium chloride  3 mL Intravenous Q12H   Continuous: . sodium chloride 75 mL/hr at 11/04/13 0130   PRN:  Assessment/Plan:  Principal Problem:   Narrow complex tachycardia Active Problems:   Diabetes mellitus, type 2   Breast cancer of upper-outer quadrant of right female breast   Hypothyroidism   Chest pain    Chest Pain/Narrow Complex Tachycardia  Cardiology following and managing. Some concern for atrial flutter. On Metoprolol. Esmolol infusion on hold. Had ECHO recently with normal systolic function. TSH 3.2. No chest pain currently. Started on IV heparin by cardiology with plans for TEE/DCCV on Monday. Discussed with Dr. Wynonia Lawman.  History  of Breast Cancer  Status post s/p lumpectomy in July. Currently undergoing radiation and chemotherapy with Trastuzumab every 3 weeks. Notified Dr. Jana Hakim and Dr. Pablo Ledger via epic.  Essential HTN  Monitor BP closely.   DM2 SSI. Check A1C   History of Asthma  Stable.   Normocytic Anemia Hgb is stable.   DVT Prophylaxis: IV Heparin    Code Status: Full Code  Family Communication: Discussed with patient  Disposition Plan: Not ready for discharge    LOS: 2 days   [facility] Pager [phone] 11/05/2013, 10:50 AM  If 8PM-8AM, please contact night-coverage at [email or web address], password [facility]

## 2013-11-05 NOTE — Progress Notes (Signed)
Pt remains in a-flutter with rate at 130. Continues on IV heparin. Voices no complaints

## 2013-11-05 NOTE — Progress Notes (Signed)
ANTICOAGULATION CONSULT NOTE - Initial Consult  Pharmacy Consult for heparin Indication: a flutter  Allergies  Allergen Reactions  . Procardia [Nifedipine] Other (See Comments)    MIGRAINES  . Diltiazem Itching    Patient Measurements: Height: 5' 4.5" (163.8 cm) Weight: 254 lb 3.2 oz (115.304 kg) IBW/kg (Calculated) : 55.85 Heparin Dosing Weight: 76 kg  Vital Signs: Temp: 98.5 F (36.9 C) (10/24 1427) Temp Source: Oral (10/24 1427) BP: 101/71 mmHg (10/24 1427) Pulse Rate: 131 (10/24 1427)  Labs:  Recent Labs  11/04/13 0019 11/04/13 0513 11/04/13 0535 11/04/13 1114 11/05/13 0305 11/05/13 1822  HGB 11.3* 10.9*  --   --  12.2  --   HCT 34.6* 33.2*  --   --  35.1*  --   PLT 210 204  --   --  302  --   HEPARINUNFRC  --   --   --   --   --  0.71*  CREATININE 1.07 1.10  --   --  1.18*  --   TROPONINI <0.30  --  <0.30 <0.30  --   --     Estimated Creatinine Clearance: 64.6 ml/min (by C-G formula based on Cr of 1.18).   Assessment: 29 yoF on heparin for a flutter.  Heparin level slightly supratherapeutic (0.71) on 1200 units/hr.   Goal of Therapy:  Heparin level 0.3-0.7 units/ml Monitor platelets by anticoagulation protocol: Yes   Plan:  - Decrease heparin level slightly to 1150 units/hr - f/u AM heparin level and CBC - Monitor for bleeding complications  Thanks for allowing pharmacy to be a part of this patient's care.  Maryanna Shape, PharmD, BCPS  Clinical Pharmacist   Pager: [phone]   11/05/2013,6:57 PM

## 2013-11-05 NOTE — Progress Notes (Signed)
UR completed 

## 2013-11-05 NOTE — Progress Notes (Signed)
ANTICOAGULATION CONSULT NOTE - Initial Consult  Pharmacy Consult for heparin Indication: a flutter  Allergies  Allergen Reactions  . Procardia [Nifedipine] Other (See Comments)    MIGRAINES  . Diltiazem Itching    Patient Measurements: Height: 5' 4.5" (163.8 cm) Weight: 254 lb 3.2 oz (115.304 kg) IBW/kg (Calculated) : 55.85 Heparin Dosing Weight: 76 kg  Vital Signs: Temp: 98.2 F (36.8 C) (10/24 0617) Temp Source: Oral (10/24 0617) BP: 112/80 mmHg (10/24 0617) Pulse Rate: 128 (10/24 0617)  Labs:  Recent Labs  11/04/13 0019 11/04/13 0513 11/04/13 0535 11/04/13 1114 11/05/13 0305  HGB 11.3* 10.9*  --   --  12.2  HCT 34.6* 33.2*  --   --  35.1*  PLT 210 204  --   --  302  CREATININE 1.07 1.10  --   --  1.18*  TROPONINI <0.30  --  <0.30 <0.30  --     Estimated Creatinine Clearance: 64.6 ml/min (by C-G formula based on Cr of 1.18).   Medical History: Past Medical History  Diagnosis Date  . Hyperlipidemia   . Obesity   . Asthma     prn neb. and inhaler  . Osteoarthritis 2003    left knee  . Non-insulin dependent type 2 diabetes mellitus   . Graves' disease   . Ophthalmic manifestation of Graves disease   . Hypertension     on multiple meds., has been on med. > 14 yr.  . Dehydration 04/13/2013  . Breast cancer 01/2013    right  . Wears glasses   . GERD (gastroesophageal reflux disease)     Medications:  Prescriptions prior to admission  Medication Sig Dispense Refill  . acetaminophen (TYLENOL) 500 MG tablet Take 1,000 mg by mouth every 4 (four) hours as needed for moderate pain.      Marland Kitchen albuterol (PROVENTIL HFA;VENTOLIN HFA) 108 (90 BASE) MCG/ACT inhaler Inhale 2 puffs into the lungs every 4 (four) hours as needed.  18 g  2  . albuterol (PROVENTIL) (2.5 MG/3ML) 0.083% nebulizer solution Take 3 mLs (2.5 mg total) by nebulization every 6 (six) hours as needed for wheezing or shortness of breath.  150 mL  0  . amLODipine (NORVASC) 10 MG tablet Take 10 mg by  mouth daily.      . Artificial Tear Ointment (ARTIFICIAL TEARS) ointment Place 1 drop into both eyes as needed (for grey eye disease).       Marland Kitchen aspirin 81 MG tablet Take 81 mg by mouth daily.      . benazepril (LOTENSIN) 40 MG tablet Take 40 mg by mouth daily.       . calcium carbonate (TUMS EX) 750 MG chewable tablet Chew 2 tablets by mouth 4 (four) times daily.      . cholecalciferol (VITAMIN D) 1000 UNITS tablet Take 1,000 Units by mouth daily.      . cloNIDine (CATAPRES - DOSED IN MG/24 HR) 0.2 mg/24hr patch Place 0.2 mg onto the skin once a week. Sunday/Mondays.      . cycloSPORINE (RESTASIS) 0.05 % ophthalmic emulsion Place 1 drop into both eyes 2 (two) times daily.       Marland Kitchen gabapentin (NEURONTIN) 100 MG capsule Take 1 capsule (100 mg total) by mouth 3 (three) times daily.  90 capsule  5  . hydrochlorothiazide (HYDRODIURIL) 25 MG tablet Take 25 mg by mouth daily.      Marland Kitchen ibuprofen (ADVIL,MOTRIN) 200 MG tablet Take 400 mg by mouth every 6 (six) hours as  needed for mild pain or moderate pain.      Marland Kitchen labetalol (NORMODYNE) 300 MG tablet Take 300 mg by mouth 2 (two) times daily.      Marland Kitchen levothyroxine (SYNTHROID, LEVOTHROID) 125 MCG tablet Take 1 tablet (125 mcg total) by mouth daily before breakfast.  30 tablet  1  . metFORMIN (GLUCOPHAGE) 500 MG tablet Take 500 mg by mouth 2 (two) times daily with a meal.      . pravastatin (PRAVACHOL) 80 MG tablet Take 40 mg by mouth at bedtime.      . vitamin B-12 (CYANOCOBALAMIN) 500 MCG tablet Take 500 mcg by mouth daily.        Assessment: 58 yo lady to start heparin for a flutter.  She received sq heparin earlier this am ~06:00.   Goal of Therapy:  Heparin level 0.3-0.7 units/ml Monitor platelets by anticoagulation protocol: Yes   Plan:  Heparin 3000 unit bolus and drip at 1200 units/hr Will check heparin level 6 hours after start Daily HL and CBC Monitor for bleeding complications  Thanks for allowing pharmacy to be a part of this patient's  care.  Excell Seltzer, PharmD Clinical Pharmacist, [phone] 11/05/2013,11:15 AM

## 2013-11-05 NOTE — Progress Notes (Signed)
Subjective:  Feels well at the present time but still with significant tachycardia.  Objective:  Vital Signs in the last 24 hours: BP 112/80  Pulse 128  Temp(Src) 98.2 F (36.8 C) (Oral)  Resp 16  Ht 5' 4.5" (1.638 m)  Wt 115.304 kg (254 lb 3.2 oz)  BMI 42.98 kg/m2  SpO2 99%  Physical Exam:  Obese pleasant black female in no acute distress Lungs:  Clear  Cardiac: Rapid, regular rhythm, normal S1 and S2, no S3 Extremities:  No edema present  Intake/Output from previous day: 10/23 0701 - 10/24 0700 In: 120 [P.O.:120] Out: 650 [Urine:650] Weight Filed Weights   11/03/13 2100 11/04/13 0122 11/05/13 0617  Weight: 114.3 kg (251 lb 15.8 oz) 115.577 kg (254 lb 12.8 oz) 115.304 kg (254 lb 3.2 oz)    Lab Results: Basic Metabolic Panel:  Recent Labs  11/04/13 0513 11/05/13 0305  NA 141 141  K 3.9 4.0  CL 103 104  CO2 27 23  GLUCOSE 132* 132*  BUN 16 21  CREATININE 1.10 1.18*    CBC:  Recent Labs  11/03/13 2014  11/04/13 0513 11/05/13 0305  WBC 5.6  < > 4.3 3.8*  NEUTROABS 3.8  --  2.6  --   HGB 11.7*  < > 10.9* 12.2  HCT 35.9*  < > 33.2* 35.1*  MCV 80.1  < > 80.6 79.6  PLT 224  < > 204 302  < > = values in this interval not displayed.  BNP    Component Value Date/Time   PROBNP 732.5* 11/04/2013 0220    PROTIME: Lab Results  Component Value Date   INR 1.29 09/07/2013   INR 1.1 03/08/2008    Telemetry: Appears to be in atrial flutter with 2:1 block at the present time  Assessment/Plan:  1. Recurrent atrial flutter with rapid heartbeat  Recommendations:  Titrate metoprolol up at the present time. Add Lanoxin for rate control. Since she is in atrial flutter go ahead and full dose anticoagulate. May require a TEE cardioversion and possible an atrial flutter ablation.     Kerry Hough  MD [facility]  11/05/2013, 10:40 AM

## 2013-11-06 DIAGNOSIS — I10 Essential (primary) hypertension: Secondary | ICD-10-CM

## 2013-11-06 LAB — BASIC METABOLIC PANEL
Anion gap: 13 (ref 5–15)
BUN: 24 mg/dL — AB (ref 6–23)
CALCIUM: 8.2 mg/dL — AB (ref 8.4–10.5)
CO2: 22 mEq/L (ref 19–32)
Chloride: 107 mEq/L (ref 96–112)
Creatinine, Ser: 1.26 mg/dL — ABNORMAL HIGH (ref 0.50–1.10)
GFR, EST AFRICAN AMERICAN: 53 mL/min — AB (ref >90)
GFR, EST NON AFRICAN AMERICAN: 46 mL/min — AB (ref >90)
GLUCOSE: 120 mg/dL — AB (ref 70–99)
Potassium: 4.5 mEq/L (ref 3.7–5.3)
Sodium: 142 mEq/L (ref 137–147)

## 2013-11-06 LAB — GLUCOSE, CAPILLARY
GLUCOSE-CAPILLARY: 128 mg/dL — AB (ref 70–99)
Glucose-Capillary: 156 mg/dL — ABNORMAL HIGH (ref 70–99)
Glucose-Capillary: 151 mg/dL — ABNORMAL HIGH (ref 70–99)
Glucose-Capillary: 125 mg/dL — ABNORMAL HIGH (ref 70–99)

## 2013-11-06 LAB — HEPARIN LEVEL (UNFRACTIONATED)
HEPARIN UNFRACTIONATED: 0.74 [IU]/mL — AB (ref 0.30–0.70)
HEPARIN UNFRACTIONATED: 0.39 [IU]/mL (ref 0.30–0.70)
Heparin Unfractionated: 0.47 IU/mL (ref 0.30–0.70)

## 2013-11-06 LAB — CBC
HEMATOCRIT: 34.1 % — AB (ref 36.0–46.0)
HEMOGLOBIN: 11.5 g/dL — AB (ref 12.0–15.0)
MCH: 27.2 pg (ref 26.0–34.0)
MCHC: 33.7 g/dL (ref 30.0–36.0)
MCV: 80.6 fL (ref 78.0–100.0)
Platelets: 221 10*3/uL (ref 150–400)
RBC: 4.23 MIL/uL (ref 3.87–5.11)
RDW: 16.8 % — AB (ref 11.5–15.5)
WBC: 4.7 10*3/uL (ref 4.0–10.5)

## 2013-11-06 LAB — HEMOGLOBIN A1C
Hgb A1c MFr Bld: 6.7 % — ABNORMAL HIGH (ref <5.7)
Mean Plasma Glucose: 146 mg/dL — ABNORMAL HIGH (ref <117)

## 2013-11-06 MED ORDER — METOPROLOL TARTRATE 50 MG PO TABS
75.0000 mg | ORAL_TABLET | Freq: Four times a day (QID) | ORAL | Status: DC
  Administered 2013-11-06 – 2013-11-08 (×8): 75 mg via ORAL
  Filled 2013-11-06 (×12): qty 1

## 2013-11-06 NOTE — Progress Notes (Signed)
[facility] PROGRESS NOTE  DARLEAN WARMOTH HBZ:[id number] DOB: Jul 03, 1955 DOA: 11/03/2013  PCP: Rubbie Battiest, MD  Brief HPI: 58yo with PMH as below presented with chest pain and narrow complex tachycardia. On Trastuzumab for breast cancer which has cardiac side effects.  Past medical history:  Past Medical History  Diagnosis Date  . Hyperlipidemia   . Obesity   . Asthma     prn neb. and inhaler  . Osteoarthritis 2003    left knee  . Non-insulin dependent type 2 diabetes mellitus   . Graves' disease   . Ophthalmic manifestation of Graves disease   . Hypertension     on multiple meds., has been on med. > 14 yr.  . Dehydration 04/13/2013  . Breast cancer 01/2013    right  . Wears glasses   . GERD (gastroesophageal reflux disease)     Consultants: Cardiology  Procedures: None  Antibiotics: None  Subjective: Patient denies any complaints. No chest pain or shortness of breath. No nausea or vomiting.   Objective: Vital Signs  Filed Vitals:   11/05/13 1427 11/05/13 2111 11/06/13 0013 11/06/13 0527  BP: 101/71 108/73 124/83 130/79  Pulse: 131 129 125 126  Temp: 98.5 F (36.9 C) 98.7 F (37.1 C)  97.9 F (36.6 C)  TempSrc: Oral Oral  Axillary  Resp: 17 18  20   Height:      Weight:      SpO2: 99% 98%  100%    Intake/Output Summary (Last 24 hours) at 11/06/13 0758 Last data filed at 11/06/13 0700  Gross per 24 hour  Intake   2032 ml  Output      0 ml  Net   2032 ml   Filed Weights   11/03/13 2100 11/04/13 0122 11/05/13 0617  Weight: 114.3 kg (251 lb 15.8 oz) 115.577 kg (254 lb 12.8 oz) 115.304 kg (254 lb 3.2 oz)    General appearance: alert, cooperative, appears stated age, no distress and moderately obese Resp: clear to auscultation bilaterally Cardio: s1s2 tachy regular, no s3s4. no rubs, bruits. systolic murmur at apex. GI: soft, non-tender; bowel sounds normal; no masses,  no organomegaly Neurologic: no focal deficits.  Lab Results:  Basic  Metabolic Panel:  Recent Labs Lab 11/03/13 2014 11/04/13 0019 11/04/13 0513 11/05/13 0305 11/06/13 0356  NA 140  --  141 141 142  K 4.1  --  3.9 4.0 4.5  CL 100  --  103 104 107  CO2 23  --  27 23 22   GLUCOSE 143*  --  132* 132* 120*  BUN 18  --  16 21 24*  CREATININE 1.11* 1.07 1.10 1.18* 1.26*  CALCIUM 9.9  --  8.9 8.9 8.2*   Liver Function Tests:  Recent Labs Lab 11/04/13 0513  AST 16  ALT 12  ALKPHOS 85  BILITOT 0.3  PROT 6.7  ALBUMIN 3.4*   CBC:  Recent Labs Lab 11/03/13 2014 11/04/13 0019 11/04/13 0513 11/05/13 0305 11/06/13 0356  WBC 5.6 4.9 4.3 3.8* 4.7  NEUTROABS 3.8  --  2.6  --   --   HGB 11.7* 11.3* 10.9* 12.2 11.5*  HCT 35.9* 34.6* 33.2* 35.1* 34.1*  MCV 80.1 82.0 80.6 79.6 80.6  PLT 224 210 204 302 221   Cardiac Enzymes:  Recent Labs Lab 11/04/13 0019 11/04/13 0535 11/04/13 1114  TROPONINI <0.30 <0.30 <0.30   BNP (last 3 results)  Recent Labs  11/04/13 0220  PROBNP 732.5*   CBG:  Recent Labs  Lab 11/05/13 0621 11/05/13 1104 11/05/13 1609 11/05/13 2115 11/06/13 0606  GLUCAP 139* 151* 169* 174* 128*    No results found for this or any previous visit (from the past 240 hour(s)).    Studies/Results: No results found.  Medications:  Scheduled: . aspirin  81 mg Oral Daily  . gabapentin  100 mg Oral TID  . insulin aspart  0-9 Units Subcutaneous TID WC & HS  . levothyroxine  125 mcg Oral QAC breakfast  . metoprolol tartrate  50 mg Oral 4 times per day  . pravastatin  40 mg Oral QHS  . sodium chloride  3 mL Intravenous Q12H   Continuous: . sodium chloride 50 mL/hr at 11/06/13 0015  . heparin 1,000 Units/hr (11/06/13 0536)   PRN:  Assessment/Plan:  Principal Problem:   Narrow complex tachycardia Active Problems:   Diabetes mellitus, type 2   Breast cancer of upper-outer quadrant of right female breast   Hypothyroidism   Chest pain   Atrial flutter    Chest Pain/Narrow Complex Tachycardia/Atrial Flutter  with 2:1 Block  Cardiology following and managing. No further CP. Trops were normal. Some concern for atrial flutter. On Metoprolol. Had ECHO recently with normal systolic function. TSH 3.2. No chest pain currently. Remains on IV heparin per cardiology with plans for TEE/DCCV on Monday. Discussed with Dr. Wynonia Lawman.  History of Breast Cancer  Status post s/p lumpectomy in July. Currently undergoing radiation and chemotherapy with Trastuzumab every 3 weeks. Notified Dr. Jana Hakim and Dr. Pablo Ledger via epic.  Essential HTN  Monitor BP closely.   DM2 SSI. Check A1C   History of Asthma  Stable.   Normocytic Anemia Hgb is stable.   Mildly Elevated Creatinine Montor for now. She is making urine.  DVT Prophylaxis: IV Heparin    Code Status: Full Code  Family Communication: Discussed with patient  Disposition Plan: Not ready for discharge    LOS: 3 days   [facility] Pager [phone] 11/06/2013, 7:58 AM  If 8PM-8AM, please contact night-coverage at [email or web address], password [facility]

## 2013-11-06 NOTE — Progress Notes (Addendum)
ANTICOAGULATION CONSULT NOTE - Follow Up Consult  Pharmacy Consult for heparin Indication: Aflutter  Labs:  Recent Labs  11/04/13 0019 11/04/13 0513 11/04/13 0535 11/04/13 1114 11/05/13 0305 11/05/13 1822 11/06/13 0356 11/06/13 1149  HGB 11.3* 10.9*  --   --  12.2  --  11.5*  --   HCT 34.6* 33.2*  --   --  35.1*  --  34.1*  --   PLT 210 204  --   --  302  --  221  --   HEPARINUNFRC  --   --   --   --   --  0.71* 0.74* 0.47  CREATININE 1.07 1.10  --   --  1.18*  --  1.26*  --   TROPONINI <0.30  --  <0.30 <0.30  --   --   --   --     Assessment: 58yo female on heparin for a flutter.  He[parin level now therapeutic.   Plan TEE DCCV.  Goal of Therapy:  Heparin level 0.3-0.7 units/ml   Plan:  Cont heparin at 1000 units/hr Check heparin level later today to confirm.  Thanks for allowing pharmacy to be a part of this patient's care.  Excell Seltzer, PharmD Clinical Pharmacist, [phone] 11/06/2013,12:43 PM  Addendum  Heparin level is therapeutic this PM  Plan  F/u with level in AM  Onnie Boer, PharmD Pager: [phone] 11/06/2013 7:54 PM

## 2013-11-06 NOTE — Progress Notes (Signed)
ANTICOAGULATION CONSULT NOTE - Follow Up Consult  Pharmacy Consult for heparin Indication: Aflutter  Labs:  Recent Labs  11/04/13 0019 11/04/13 0513 11/04/13 0535 11/04/13 1114 11/05/13 0305 11/05/13 1822 11/06/13 0356  HGB 11.3* 10.9*  --   --  12.2  --   --   HCT 34.6* 33.2*  --   --  35.1*  --   --   PLT 210 204  --   --  302  --   --   HEPARINUNFRC  --   --   --   --   --  0.71* 0.74*  CREATININE 1.07 1.10  --   --  1.18*  --   --   TROPONINI <0.30  --  <0.30 <0.30  --   --   --     Assessment: 58yo female now w/ higher heparin level despite rate decrease yesterday.  Goal of Therapy:  Heparin level 0.3-0.7 units/ml   Plan:  Will decrease heparin gtt by ~1 unit/kg/hr to 1000 units/hr and check level in 6hr.  Wynona Neat, PharmD, BCPS  11/06/2013,5:34 AM

## 2013-11-06 NOTE — Progress Notes (Signed)
Subjective:  Continues to feel fairly good. No shortness of breath or chest pain. Continues in atrial flutter with 2 to one block..  Objective:  Vital Signs in the last 24 hours: BP 130/79  Pulse 126  Temp(Src) 97.9 F (36.6 C) (Axillary)  Resp 20  Ht 5' 4.5" (1.638 m)  Wt 115.304 kg (254 lb 3.2 oz)  BMI 42.98 kg/m2  SpO2 100%  Physical Exam:  Obese pleasant black female in no acute distress  Lungs:  Clear  Cardiac: Rapid, regular rhythm, normal S1 and S2, no S3 Extremities:  No edema present  Intake/Output from previous day: 10/24 0701 - 10/25 0700 In: 2032 [P.O.:1200; I.V.:832] Out: -  Weight Filed Weights   11/03/13 2100 11/04/13 0122 11/05/13 0617  Weight: 114.3 kg (251 lb 15.8 oz) 115.577 kg (254 lb 12.8 oz) 115.304 kg (254 lb 3.2 oz)    Lab Results: Basic Metabolic Panel:  Recent Labs  11/05/13 0305 11/06/13 0356  NA 141 142  K 4.0 4.5  CL 104 107  CO2 23 22  GLUCOSE 132* 120*  BUN 21 24*  CREATININE 1.18* 1.26*    CBC:  Recent Labs  11/03/13 2014  11/04/13 0513 11/05/13 0305 11/06/13 0356  WBC 5.6  < > 4.3 3.8* 4.7  NEUTROABS 3.8  --  2.6  --   --   HGB 11.7*  < > 10.9* 12.2 11.5*  HCT 35.9*  < > 33.2* 35.1* 34.1*  MCV 80.1  < > 80.6 79.6 80.6  PLT 224  < > 204 302 221  < > = values in this interval not displayed.  BNP    Component Value Date/Time   PROBNP 732.5* 11/04/2013 0220    PROTIME: Lab Results  Component Value Date   INR 1.29 09/07/2013   INR 1.1 03/08/2008    Telemetry: atrial flutter with 2:1 block at the present time  Assessment/Plan:  1. Recurrent atrial flutter with rapid heartbeat  Recommendations:  Rate control is quite difficult despite higher doses of metoprolol. I think that she will need a TEE cardioversion. The other question will be whether she will need to have an atrial flutter ablation. Keep nothing by mouth for TEE cardioversion.     Kerry Hough  MD [facility]  11/06/2013, 10:46  AM

## 2013-11-07 ENCOUNTER — Ambulatory Visit: Payer: Medicare Other

## 2013-11-07 DIAGNOSIS — I483 Typical atrial flutter: Secondary | ICD-10-CM

## 2013-11-07 DIAGNOSIS — C773 Secondary and unspecified malignant neoplasm of axilla and upper limb lymph nodes: Secondary | ICD-10-CM

## 2013-11-07 DIAGNOSIS — I4891 Unspecified atrial fibrillation: Secondary | ICD-10-CM

## 2013-11-07 LAB — GLUCOSE, CAPILLARY
Glucose-Capillary: 112 mg/dL — ABNORMAL HIGH (ref 70–99)
Glucose-Capillary: 120 mg/dL — ABNORMAL HIGH (ref 70–99)
Glucose-Capillary: 138 mg/dL — ABNORMAL HIGH (ref 70–99)
Glucose-Capillary: 109 mg/dL — ABNORMAL HIGH (ref 70–99)

## 2013-11-07 LAB — BASIC METABOLIC PANEL
ANION GAP: 13 (ref 5–15)
BUN: 24 mg/dL — AB (ref 6–23)
CHLORIDE: 107 meq/L (ref 96–112)
CO2: 24 mEq/L (ref 19–32)
CREATININE: 1.26 mg/dL — AB (ref 0.50–1.10)
Calcium: 8.1 mg/dL — ABNORMAL LOW (ref 8.4–10.5)
GFR, EST AFRICAN AMERICAN: 53 mL/min — AB (ref >90)
GFR, EST NON AFRICAN AMERICAN: 46 mL/min — AB (ref >90)
Glucose, Bld: 154 mg/dL — ABNORMAL HIGH (ref 70–99)
Potassium: 4.1 mEq/L (ref 3.7–5.3)
Sodium: 144 mEq/L (ref 137–147)

## 2013-11-07 LAB — CBC
HEMATOCRIT: 32.6 % — AB (ref 36.0–46.0)
Hemoglobin: 10.7 g/dL — ABNORMAL LOW (ref 12.0–15.0)
MCH: 26.8 pg (ref 26.0–34.0)
MCHC: 32.8 g/dL (ref 30.0–36.0)
MCV: 81.5 fL (ref 78.0–100.0)
PLATELETS: 234 10*3/uL (ref 150–400)
RBC: 4 MIL/uL (ref 3.87–5.11)
RDW: 17.1 % — AB (ref 11.5–15.5)
WBC: 4.8 10*3/uL (ref 4.0–10.5)

## 2013-11-07 LAB — HEPARIN LEVEL (UNFRACTIONATED): Heparin Unfractionated: 0.35 IU/mL (ref 0.30–0.70)

## 2013-11-07 MED ORDER — SODIUM CHLORIDE 0.9 % IV SOLN: INTRAVENOUS | Status: DC

## 2013-11-07 MED ORDER — LEVALBUTEROL HCL 0.63 MG/3ML IN NEBU
0.6300 mg | INHALATION_SOLUTION | Freq: Four times a day (QID) | RESPIRATORY_TRACT | Status: DC | PRN
  Administered 2013-11-07: 0.63 mg via RESPIRATORY_TRACT
  Filled 2013-11-07 (×2): qty 3

## 2013-11-07 NOTE — Progress Notes (Signed)
[facility] PROGRESS NOTE  [name] WIO:[id number] DOB: [birth year] DOA: 11/03/2013  PCP: Rubbie Battiest, MD  Brief HPI: 58yo with PMH as below presented with chest pain and narrow complex tachycardia. On Trastuzumab for breast cancer which has cardiac side effects.  Past medical history:  Past Medical History  Diagnosis Date  . Hyperlipidemia   . Obesity   . Asthma     prn neb. and inhaler  . Osteoarthritis 2003    left knee  . Non-insulin dependent type 2 diabetes mellitus   . Graves' disease   . Ophthalmic manifestation of Graves disease   . Hypertension     on multiple meds., has been on med. > 14 yr.  . Dehydration 04/13/2013  . Breast cancer 01/2013    right  . Wears glasses   . GERD (gastroesophageal reflux disease)     Consultants: Cardiology, Oncology  Procedures: None  Antibiotics: None  Subjective: Patient continues to feel well. No chest pain or shortness of breath. No nausea or vomiting.   Objective: Vital Signs  Filed Vitals:   11/06/13 0527 11/06/13 1513 11/06/13 2134 11/07/13 0507  BP: 130/79 115/69 126/77 119/84  Pulse: 126 130 130 129  Temp: 97.9 F (36.6 C) 98.5 F (36.9 C) 98.4 F (36.9 C) 98.7 F (37.1 C)  TempSrc: Axillary Oral Oral Oral  Resp: 20 20 20 22   Height:      Weight: 117.482 kg (259 lb)     SpO2: 100% 100% 100% 99%    Intake/Output Summary (Last 24 hours) at 11/07/13 0951 Last data filed at 11/07/13 0800  Gross per 24 hour  Intake   2580 ml  Output      0 ml  Net   2580 ml   Filed Weights   11/04/13 0122 11/05/13 0617 11/06/13 0527  Weight: 115.577 kg (254 lb 12.8 oz) 115.304 kg (254 lb 3.2 oz) 117.482 kg (259 lb)    General appearance: alert, cooperative, appears stated age, no distress and moderately obese Resp: clear to auscultation bilaterally Cardio: s1s2 tachy regular, no s3s4. no rubs, bruits. systolic murmur at apex. GI: soft, non-tender; bowel sounds normal; no masses,  no  organomegaly Neurologic: no focal deficits.  Lab Results:  Basic Metabolic Panel:  Recent Labs Lab 11/03/13 2014 11/04/13 0019 11/04/13 0513 11/05/13 0305 11/06/13 0356 11/07/13 0456  NA 140  --  141 141 142 144  K 4.1  --  3.9 4.0 4.5 4.1  CL 100  --  103 104 107 107  CO2 23  --  27 23 22 24   GLUCOSE 143*  --  132* 132* 120* 154*  BUN 18  --  16 21 24* 24*  CREATININE 1.11* 1.07 1.10 1.18* 1.26* 1.26*  CALCIUM 9.9  --  8.9 8.9 8.2* 8.1*   Liver Function Tests:  Recent Labs Lab 11/04/13 0513  AST 16  ALT 12  ALKPHOS 85  BILITOT 0.3  PROT 6.7  ALBUMIN 3.4*   CBC:  Recent Labs Lab 11/03/13 2014 11/04/13 0019 11/04/13 0513 11/05/13 0305 11/06/13 0356 11/07/13 0456  WBC 5.6 4.9 4.3 3.8* 4.7 4.8  NEUTROABS 3.8  --  2.6  --   --   --   HGB 11.7* 11.3* 10.9* 12.2 11.5* 10.7*  HCT 35.9* 34.6* 33.2* 35.1* 34.1* 32.6*  MCV 80.1 82.0 80.6 79.6 80.6 81.5  PLT 224 210 204 302 221 234   Cardiac Enzymes:  Recent Labs Lab 11/04/13 0019 11/04/13 0535 11/04/13 1114  TROPONINI <0.30 <0.30 <0.30   BNP (last 3 results)  Recent Labs  11/04/13 0220  PROBNP 732.5*   CBG:  Recent Labs Lab 11/06/13 0606 11/06/13 1115 11/06/13 1606 11/06/13 2137 11/07/13 0702  GLUCAP 128* 156* 151* 125* 112*    No results found for this or any previous visit (from the past 240 hour(s)).    Studies/Results: No results found.  Medications:  Scheduled: . aspirin  81 mg Oral Daily  . gabapentin  100 mg Oral TID  . insulin aspart  0-9 Units Subcutaneous TID WC & HS  . levothyroxine  125 mcg Oral QAC breakfast  . metoprolol tartrate  75 mg Oral 4 times per day  . pravastatin  40 mg Oral QHS  . sodium chloride  3 mL Intravenous Q12H   Continuous: . sodium chloride 50 mL/hr at 11/06/13 2132  . heparin 1,000 Units/hr (11/06/13 0847)   PRN:  Assessment/Plan:  Principal Problem:   Narrow complex tachycardia Active Problems:   Diabetes mellitus, type 2   Breast  cancer of upper-outer quadrant of right female breast   Hypothyroidism   Chest pain   Atrial flutter    Chest Pain/Narrow Complex Tachycardia/Atrial Flutter with 2:1 Block  Cardiology following and managing. No further CP. Trops were normal. On Metoprolol. Had ECHO recently with normal systolic function. TSH 3.2. No chest pain currently. Remains on IV heparin per cardiology with plans for TEE/DCCV. This could not be done today due to scheduling issues. Will be done 10/27. Discussed with Dr. Julianne Handler.  History of Breast Cancer  Status post s/p lumpectomy in July. Currently undergoing radiation and chemotherapy with Trastuzumab every 3 weeks. Appreciate Dr. Virgie Dad input. Holding chemo for now. No radiation till HR is better.   Essential HTN  Monitor BP closely.   DM2 SSI. A1C 6.7. Metformin on hold.   History of Asthma  Stable.   Normocytic Anemia Hgb is stable.   Mildly Elevated Creatinine Remains stable. Montor for now. She is making urine.  DVT Prophylaxis: On IV Heparin    Code Status: Full Code  Family Communication: Discussed with patient  Disposition Plan: Not ready for discharge. TEE/DCCV 10/27.    LOS: 4 days   [facility] Pager [phone] 11/07/2013, 9:51 AM  If 8PM-8AM, please contact night-coverage at [email or web address], password [facility]

## 2013-11-07 NOTE — Progress Notes (Addendum)
     SUBJECTIVE: No complaints.   BP 119/84  Pulse 129  Temp(Src) 98.7 F (37.1 C) (Oral)  Resp 22  Ht 5' 4.5" (1.638 m)  Wt 259 lb (117.482 kg)  BMI 43.79 kg/m2  SpO2 99%  Intake/Output Summary (Last 24 hours) at 11/07/13 1104 Last data filed at 11/07/13 0800  Gross per 24 hour  Intake   2340 ml  Output      0 ml  Net   2340 ml    PHYSICAL EXAM General: Well developed, well nourished, in no acute distress. Alert and oriented x 3.  Psych:  Good affect, responds appropriately Neck: No JVD. No masses noted.  Lungs: Clear bilaterally with no wheezes or rhonci noted.  Heart: Regular, tachy. No murmurs noted. Abdomen: Bowel sounds are present. Soft, non-tender.  Extremities: No lower extremity edema.   LABS: Basic Metabolic Panel:  Recent Labs  11/06/13 0356 11/07/13 0456  NA 142 144  K 4.5 4.1  CL 107 107  CO2 22 24  GLUCOSE 120* 154*  BUN 24* 24*  CREATININE 1.26* 1.26*  CALCIUM 8.2* 8.1*   CBC:  Recent Labs  11/06/13 0356 11/07/13 0456  WBC 4.7 4.8  HGB 11.5* 10.7*  HCT 34.1* 32.6*  MCV 80.6 81.5  PLT 221 234   Cardiac Enzymes:  Recent Labs  11/04/13 1114  TROPONINI <0.30   Current Meds: . aspirin  81 mg Oral Daily  . gabapentin  100 mg Oral TID  . insulin aspart  0-9 Units Subcutaneous TID WC & HS  . levothyroxine  125 mcg Oral QAC breakfast  . metoprolol tartrate  75 mg Oral 4 times per day  . pravastatin  40 mg Oral QHS  . sodium chloride  3 mL Intravenous Q12H   ASSESSMENT AND PLAN:  1. Atrial flutter: She continues to be poorly rate controlled despite high doses of metoprolol. She did not tolerate the diltiazem drip. The plan over the weekend has been for a TEE guided cardioversion. This was not scheduled. I have checked with endo this am and there are no openings on the schedule today. Will make NPO tonight and plan at 8 am tomorrow. Will place pre-TEE/DCCV orders now. Continue heparin drip. Will need long term anti-coagulation post  DCCV. Will start NOAC tomorrow post DCCV.      MCALHANY,CHRISTOPHER  10/26/201511:04 AM

## 2013-11-07 NOTE — Progress Notes (Signed)
Received call from Amy, RT on L2 inquiring if this patient could be treated with radiation therapy today. Phoned patient's nurse Merleen Nicely, RN. She reports the patient is scheduled for a TEE today. Dr. Jana Hakim indicates he wants the patient to receive radiation at Dr. Unknown Jim discretion. Radiation held today for TEE. Will check status tomorrow. Therapist on L2 informed.

## 2013-11-07 NOTE — Consult Note (Signed)
[name]  Telephone:([phone] Fax:(336) [phone]     ID: [name] DOB: June 15, 1955  MR#: [id number]  WUG#:[id number]  Patient Care Team: Mikey Kirschner, MD as PCP - General (Family Medicine) Gearlean Alf, MD (Orthopedic Surgery)  BREAST CANCER HISTORY: From Dr Dana Allan original intake note:  The patient herself noted a change in her right breast November 2014, but did not tell her family until after Christmas at the year. They "pretty much forced me" to have a mammogram and bilateral diagnostic mammography and right ultrasound 01/19/2013 at the breast Center showed an irregular mass associated with pleomorphic calcifications in the upper-outer quadrant of the right breast measuring 1.6 cm. There was an abnormal appearing right axillary lymph node. On physical exam, there was a movable firm palpable mass in the right breast measuring approximately 2 cm by palpation. Ultrasound showed this to be an irregularly marginated hypoechoic mass measuring 1.8 cm. In the right axilla the ultrasound showed a 1.3 cm abnormal appearing level I lymph node (loss a fatty hilum).  The 01/26/2013 the patient underwent biopsy of both the right breast mass in question and a suspicious right axillary lymph node. 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  Her subsequent history is as detailed below.  INTERVAL HISTORY: [name] had her radiation treatment 11/03/2013, went home and did some housework--"nothing but making the bed and folding some towels." In mid afternoon she felt like she had heartburn and took some Tums. Later that evening she stood up to go to the kitchen and  felt likt "my heart was going to jump out of my chest." She sat back down, felt better, and got up again with the same symptoms. She phoned her daughter and CMS who brought her to the ED where she was found to have a SVT. She was eventually diagnosed with A Flutter with 2:1 block and repeat echo was obtained 11/04/2013-- report pending. She is scheduled for TEE and cardioversion.  REVIEW OF SYSTEMS: [name] feels much better, is able to ambulate t the BR w/o palpitations, mostly she has been staying in bed; denies, SOB, cough, pleurisy; no constipation; was tolerating radiation "fine." No problems with last trastuzumab treatment, which was 10/19/2013  PAST MEDICAL HISTORY: Past Medical History  Diagnosis Date  . Hyperlipidemia   . Obesity   . Asthma     prn neb. and inhaler  . Osteoarthritis 2003    left knee  . Non-insulin dependent type 2 diabetes mellitus   . Graves' disease   . Ophthalmic manifestation of Graves disease   . Hypertension     on multiple meds., has been on med. > 14 yr.  . Dehydration 04/13/2013  . Breast cancer 01/2013    right  . Wears glasses   . GERD (gastroesophageal reflux disease)     PAST SURGICAL HISTORY: Past Surgical History  Procedure Laterality Date  . Total knee arthroplasty Left 2003  . Total thyroidectomy    . Colonoscopy  2007  . Tubal ligation  1985  . Colonoscopy w/ polypectomy  2009  . Portacath placement Right 02/17/2013    Procedure: INSERTION PORT-A-CATH;  Surgeon: Joyice Faster. Cornett, MD;  Location: [place];  Service: General;  Laterality: Right;    FAMILY HISTORY Family History  Problem Relation Age of Onset  . Heart disease Father   . Lung cancer Father   . Diabetes Mother   . Diabetes Sister   . Hypertension Sister   . Asthma Sister   the patient's father died from lung cancer at the age of 50. The patient's mother died from thyroid cancer at the age of 49. The patient had 2 brothers, 2 sisters. There is no other  history of breast or ovarian cancer in the family to her knowledge   GYNECOLOGIC HISTORY:  No LMP recorded. Patient is postmenopausal. Menarche age 71, first live birth age 65. The patient is GX P2. She went through the change of life approximately age 37. She did not take hormone replacement.  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 She is now disabled secondary to her knee problems. She is divorced. At home she lives with her 2 daughters, [name] who works in direct supports to mentally retarded children, and [name], who is a group Games developer for mentally retarded children. The patient has no grandchildren. She attends a [employer]     ADVANCED DIRECTIVES: not in place   HEALTH MAINTENANCE: History  Substance Use Topics  . Smoking status: Former Smoker -- 0.25 packs/day for 20 years    Quit date: 02/10/1991  . Smokeless tobacco: Never Used  . Alcohol Use: No     Allergies  Allergen Reactions  . Procardia [Nifedipine] Other (See Comments)    MIGRAINES  . Diltiazem Itching    Current Facility-Administered Medications  Medication Dose Route Frequency Provider Last Rate Last Dose  . 0.9 %  sodium chloride infusion   Intravenous Continuous Bonnielee Haff, MD 50 mL/hr at 11/06/13 2132    . acetaminophen (TYLENOL) tablet 650 mg  650 mg Oral Q6H PRN Ritta Slot, NP   650 mg at 11/05/13 1148  . aspirin chewable tablet 81 mg  81 mg Oral Daily Shanda Howells, MD   81 mg at 11/06/13 1100  . gabapentin (NEURONTIN) capsule 100 mg  100 mg Oral TID Shanda Howells, MD   100 mg at 11/06/13 2132  . heparin ADULT infusion 100 units/mL (25000 units/250 mL)  1,000 Units/hr Intravenous Continuous Rogue Bussing, [facility] 10 mL/hr at 11/06/13 0847 1,000 Units/hr at 11/06/13 0847  . insulin aspart (novoLOG) injection 0-9 Units  0-9 Units Subcutaneous TID WC & HS Ritta Slot, NP   1 Units at 11/06/13 2346  . levalbuterol  (XOPENEX) nebulizer solution 0.63 mg  0.63 mg Nebulization Q6H PRN Samella Parr, NP   0.63 mg at 11/07/13 9326  . levothyroxine (SYNTHROID, LEVOTHROID) tablet 125 mcg  125 mcg Oral QAC breakfast Shanda Howells, MD   125 mcg at 11/06/13 7124  . metoprolol tartrate (LOPRESSOR) tablet 75 mg  75 mg Oral 4 times per day Jacolyn Reedy, MD   75 mg at 11/06/13 2346  . oxyCODONE (Oxy IR/ROXICODONE) immediate release tablet 5 mg  5 mg Oral Q6H PRN Bonnielee Haff, MD   5 mg at 11/06/13 2132  . pravastatin (PRAVACHOL) tablet 40 mg  40 mg Oral QHS Shanda Howells, MD   40 mg at 11/06/13 2132  . sodium chloride 0.9 % injection 3 mL  3 mL Intravenous Q12H Shanda Howells, MD   3 mL at 11/06/13 1000    OBJECTIVE: middle aged [place] American woman  examined inbed Filed Vitals:   11/07/13 0507  BP: 119/84  Pulse: 129  Temp: 98.7 F (37.1 C)  Resp: 22     Body mass index is 43.79 kg/(m^2).   Ocular: Sclerae unicteric, EOMs intact Ear-nose-throat: Oropharynx clear, slightly dry Lymphatic: No cervical or supraclavicular adenopathy Lungs no rales or rhonchi, auscultated anterolaterally Heart rapid, regular rate Abd soft, nontender, positive bowel sounds Neuro: non-focal, well-oriented, appropriate affect Breasts: deferred   LAB RESULTS:  CMP     Component Value Date/Time   NA 144 11/07/2013 0456   NA 144 10/19/2013 0900   K 4.1 11/07/2013 0456   K 4.0 10/19/2013 0900   CL 107 11/07/2013 0456   CO2 24 11/07/2013 0456   CO2 28 10/19/2013 0900   GLUCOSE 154* 11/07/2013 0456   GLUCOSE 155* 10/19/2013 0900   BUN 24* 11/07/2013 0456   BUN 17.6 10/19/2013 0900   CREATININE 1.26* 11/07/2013 0456   CREATININE 1.2* 10/19/2013 0900   CREATININE 1.28* 05/06/2013 1149   CALCIUM 8.1* 11/07/2013 0456   CALCIUM 9.6 10/19/2013 0900   CALCIUM 6.4* 05/08/2013 1215   PROT 6.7 11/04/2013 0513   PROT 7.2 10/19/2013 0900   ALBUMIN 3.4* 11/04/2013 0513   ALBUMIN 3.8 10/19/2013 0900   AST 16 11/04/2013 0513   AST 16  10/19/2013 0900   ALT 12 11/04/2013 0513   ALT 13 10/19/2013 0900   ALKPHOS 85 11/04/2013 0513   ALKPHOS 85 10/19/2013 0900   BILITOT 0.3 11/04/2013 0513   BILITOT 0.44 10/19/2013 0900   GFRNONAA 46* 11/07/2013 0456   GFRAA 53* 11/07/2013 0456    I No results found for this basename: SPEP, UPEP,  kappa and lambda light chains    Lab Results  Component Value Date   WBC 4.8 11/07/2013   NEUTROABS 2.6 11/04/2013   HGB 10.7* 11/07/2013   HCT 32.6* 11/07/2013   MCV 81.5 11/07/2013   PLT 234 11/07/2013    @LASTCHEMISTRY @  No results found for this basename: LABCA2    No components found with this basename: LABCA125    No results found for this basename: INR,  in the last 168 hours  Urinalysis    Component Value Date/Time   COLORURINE YELLOW 09/06/2013 [place] 09/06/2013 1040   LABSPEC 1.015 09/06/2013 1040   PHURINE 5.0 09/06/2013 1040   GLUCOSEU NEGATIVE 09/06/2013 [place] 09/06/2013 [place] 09/06/2013 1040   KETONESUR NEGATIVE 09/06/2013 1040   PROTEINUR NEGATIVE 09/06/2013 1040   UROBILINOGEN 0.2 09/06/2013 1040   NITRITE NEGATIVE 09/06/2013 1040   LEUKOCYTESUR NEGATIVE 09/06/2013 1040    STUDIES: Ct Angio Chest Pe W/cm &/or Wo Cm  11/03/2013   CLINICAL DATA:  Mid chest pain starting 3 p.m. today, shortness of Breath  EXAM: CT ANGIOGRAPHY CHEST WITH CONTRAST  TECHNIQUE: Multidetector CT imaging of the chest was performed using the standard protocol during bolus administration of intravenous contrast. Multiplanar CT image reconstructions and MIPs were obtained to evaluate the vascular anatomy.  CONTRAST:  193m OMNIPAQUE IOHEXOL 350 MG/ML SOLN  COMPARISON:  02/26/2011  FINDINGS: There is significant skin thickening right breast measures up to 1 cm. Inflammatory or neoplastic disease cannot be excluded. Clinical correlation is necessary.  Sagittal images of the spine shows degenerative changes thoracic spine. No destructive bony  lesions are noted.  Surgical clips are noted at the level of thoracic inlet probable post thyroid surgery. Central airways are patent. No aortic aneurysm. Mild atherosclerotic calcifications  of thoracic aorta. There is cardiomegaly.  No pulmonary embolus is identified.  The visualized upper abdomen shows no adrenal gland mass.  Images of the lung parenchyma shows no acute infiltrate or pulmonary edema. No pulmonary nodules are noted. There is no evidence of pneumothorax.  There is no mediastinal hematoma or adenopathy. No hilar adenopathy. No bronchiectasis.  Review of the MIP images confirms the above findings.  IMPRESSION: 1. No pulmonary embolus is noted. 2. No acute infiltrate or pulmonary edema. No mediastinal hematoma or adenopathy. 3. Significant skin thickening right breast up to 1 cm. Inflammatory or neoplastic metastatic disease cannot be excluded. Clinical correlation is necessary. 4. Degenerative changes thoracic spine.   Electronically Signed   By: Lahoma Crocker M.D.   On: 11/03/2013 22:33   Dg Chest Port 1 View  11/03/2013   CLINICAL DATA:  Chest pain and shortness of breath for 1 day  EXAM: PORTABLE CHEST - 1 VIEW  COMPARISON:  September 06, 2013  FINDINGS: The heart size and mediastinal contours are stable. Right central venous line is unchanged. Surgical clips are identified in the superior mediastinum. There is no focal infiltrate, pulmonary edema, or pleural effusion. The visualized skeletal structures are stable.  IMPRESSION: No active cardiopulmonary disease.   Electronically Signed   By: Abelardo Diesel M.D.   On: 11/03/2013 20:30    ASSESSMENT: 58 y.o. [place] woman  (1) status post right breast and right axillary lymph node biopsy 01/26/2013, for a clinical T1c N1, stage IIA invasive ductal carcinoma, grade 3, estrogen and progesterone receptor positive, HER-2 amplified by immunohistochemistry (3+), with an MIB-1 of 70%.   (2) treated neo-adjuvantly with 4 of 6 planned cycles of  docetaxel, carboplatin, trastuzumab,and pertuzumab, last dose 04/27/2013  (a) final 2 cycles of chemotherapy omitted in the face of multiple renal and electrolyte abnormalities  (b) pertuzumab and trastuzumab were continued until definitive surgery  (c) trastuzumab being continued to complete one year of anti-HER-2 treatment -- most recent dose 10/19/2013 (d) echocardiograms every 3 months while on trastuzumab; most recent study 10/12/2013  (3) status post right lumpectomy and sentinel lymph node sampling 07/19/2013 showing a complete pathologic response (ypT0 ypN0)   (4) receiving adjuvant radiation ongoing-- to be completed mid-November  (5) antiestrogen therapy to follow radiation   PLAN: [name] is receiving radiation to the Right breast-- accordingly scatter to the heart should be minimal or none. Also, trastuzumab (Herceptin) may affect the heart muscle, not generally the conducting system. [name]'s echo studies including the most recent one remain normal. -- In short I do not believe her current breast cancer therapy is the cause of her heart problems. However, we are going to hold the trastuzumab (which incidentally has a half-life of about 30 days) until the current problems have been taken care of.  Her radiation treatments have been interrupted--resumption will be at Dr Unknown Jim discretion.  Note this patient had a complete pathologic response to her chemotherapy-- that means her long-term outlook from a breast cancer point of view is excellent.   Appreciate your excellent care to this patient. Will follow with you.   Chauncey Cruel, MD   11/07/2013 6:52 AM

## 2013-11-07 NOTE — Progress Notes (Signed)
[name] for heparin Indication: Aflutter  Labs:  Recent Labs  11/04/13 1114  11/05/13 0305  11/06/13 0356 11/06/13 1149 11/06/13 1901 11/07/13 0310 11/07/13 0456  HGB  --   < > 12.2  --  11.5*  --   --   --  10.7*  HCT  --   --  35.1*  --  34.1*  --   --   --  32.6*  PLT  --   --  302  --  221  --   --   --  234  HEPARINUNFRC  --   --   --   < > 0.74* 0.47 0.39 0.35  --   CREATININE  --   --  1.18*  --  1.26*  --   --   --  1.26*  TROPONINI <0.30  --   --   --   --   --   --   --   --   < > = values in this interval not displayed.  Assessment: 58 yo female on heparin for Aflutter.  Heparin level therapeutic.  Anticipate TEE cardioversion today. CBC stable.  Goal of Therapy:  Heparin level 0.3-0.7 units/ml   Plan:  -Continue heparin at 1000 units/hr -Daily HL, CBC -F/u anticoag plans after cardioversion    Hughes Better, PharmD, BCPS Clinical Pharmacist Pager: [phone] 11/07/2013 10:31 AM

## 2013-11-08 ENCOUNTER — Ambulatory Visit: Payer: Medicare Other

## 2013-11-08 ENCOUNTER — Ambulatory Visit
Admit: 2013-11-08 | Discharge: 2013-11-08 | Disposition: A | Discharge status: Home / Self Care | Payer: Medicare Other | Attending: Radiation Oncology | Admitting: Radiation Oncology

## 2013-11-08 ENCOUNTER — Encounter (HOSPITAL_COMMUNITY): Payer: Medicare Other | Admitting: Critical Care Medicine

## 2013-11-08 ENCOUNTER — Inpatient Hospital Stay (HOSPITAL_COMMUNITY): Payer: Medicare Other | Admitting: Critical Care Medicine

## 2013-11-08 ENCOUNTER — Encounter (HOSPITAL_COMMUNITY)
Admission: EM | Disposition: A | Discharge status: Home / Self Care | Payer: Medicare Other | Source: Home / Self Care | Attending: Internal Medicine

## 2013-11-08 ENCOUNTER — Ambulatory Visit: Payer: Medicare Other | Admitting: Radiation Oncology

## 2013-11-08 ENCOUNTER — Encounter (HOSPITAL_COMMUNITY): Payer: Self-pay | Admitting: Critical Care Medicine

## 2013-11-08 DIAGNOSIS — I341 Nonrheumatic mitral (valve) prolapse: Secondary | ICD-10-CM

## 2013-11-08 DIAGNOSIS — I340 Nonrheumatic mitral (valve) insufficiency: Secondary | ICD-10-CM

## 2013-11-08 DIAGNOSIS — I4892 Unspecified atrial flutter: Secondary | ICD-10-CM

## 2013-11-08 HISTORY — PX: CARDIOVERSION: SHX1299

## 2013-11-08 HISTORY — PX: TEE WITHOUT CARDIOVERSION: SHX5443

## 2013-11-08 LAB — CBC
HCT: 30.7 % — ABNORMAL LOW (ref 36.0–46.0)
Hemoglobin: 10.3 g/dL — ABNORMAL LOW (ref 12.0–15.0)
MCH: 27.3 pg (ref 26.0–34.0)
MCHC: 33.6 g/dL (ref 30.0–36.0)
MCV: 81.4 fL (ref 78.0–100.0)
PLATELETS: 205 10*3/uL (ref 150–400)
RBC: 3.77 MIL/uL — ABNORMAL LOW (ref 3.87–5.11)
RDW: 17.3 % — AB (ref 11.5–15.5)
WBC: 4.4 10*3/uL (ref 4.0–10.5)

## 2013-11-08 LAB — GLUCOSE, CAPILLARY
GLUCOSE-CAPILLARY: 131 mg/dL — AB (ref 70–99)
GLUCOSE-CAPILLARY: 149 mg/dL — AB (ref 70–99)
Glucose-Capillary: 175 mg/dL — ABNORMAL HIGH (ref 70–99)
Glucose-Capillary: 126 mg/dL — ABNORMAL HIGH (ref 70–99)
Glucose-Capillary: 135 mg/dL — ABNORMAL HIGH (ref 70–99)

## 2013-11-08 LAB — HEPARIN LEVEL (UNFRACTIONATED): Heparin Unfractionated: 0.34 IU/mL (ref 0.30–0.70)

## 2013-11-08 LAB — BASIC METABOLIC PANEL
Anion gap: 13 (ref 5–15)
BUN: 21 mg/dL (ref 6–23)
CALCIUM: 8.3 mg/dL — AB (ref 8.4–10.5)
CO2: 24 mEq/L (ref 19–32)
CREATININE: 1.22 mg/dL — AB (ref 0.50–1.10)
Chloride: 108 mEq/L (ref 96–112)
GFR calc non Af Amer: 48 mL/min — ABNORMAL LOW (ref >90)
GFR, EST AFRICAN AMERICAN: 55 mL/min — AB (ref >90)
Glucose, Bld: 136 mg/dL — ABNORMAL HIGH (ref 70–99)
Potassium: 4.1 mEq/L (ref 3.7–5.3)
Sodium: 145 mEq/L (ref 137–147)

## 2013-11-08 SURGERY — ECHOCARDIOGRAM, TRANSESOPHAGEAL
Anesthesia: Monitor Anesthesia Care

## 2013-11-08 MED ORDER — GUAIFENESIN 100 MG/5ML PO SYRP
200.0000 mg | ORAL_SOLUTION | ORAL | Status: DC | PRN
  Administered 2013-11-08 – 2013-11-09 (×2): 200 mg via ORAL
  Filled 2013-11-08 (×2): qty 10

## 2013-11-08 MED ORDER — SODIUM CHLORIDE 0.9 % IV SOLN
INTRAVENOUS | Status: DC | PRN
  Administered 2013-11-08: 08:00:00 via INTRAVENOUS

## 2013-11-08 MED ORDER — METOPROLOL TARTRATE 50 MG PO TABS
75.0000 mg | ORAL_TABLET | Freq: Two times a day (BID) | ORAL | Status: DC
  Administered 2013-11-09 (×2): 75 mg via ORAL
  Filled 2013-11-08 (×3): qty 1

## 2013-11-08 MED ORDER — APIXABAN 5 MG PO TABS
5.0000 mg | ORAL_TABLET | Freq: Two times a day (BID) | ORAL | Status: DC
  Administered 2013-11-08 – 2013-11-09 (×3): 5 mg via ORAL
  Filled 2013-11-08 (×4): qty 1

## 2013-11-08 MED ORDER — BUTAMBEN-TETRACAINE-BENZOCAINE 2-2-14 % EX AERO
INHALATION_SPRAY | CUTANEOUS | Status: DC | PRN
  Administered 2013-11-08: 2 via TOPICAL

## 2013-11-08 MED ORDER — ONDANSETRON HCL 4 MG/2ML IJ SOLN
INTRAMUSCULAR | Status: DC | PRN
  Administered 2013-11-08: 4 mg via INTRAVENOUS

## 2013-11-08 MED ORDER — PROPOFOL INFUSION 10 MG/ML OPTIME
INTRAVENOUS | Status: DC | PRN
  Administered 2013-11-08: 100 ug/kg/min via INTRAVENOUS

## 2013-11-08 NOTE — Discharge Instructions (Signed)

## 2013-11-08 NOTE — Progress Notes (Signed)
*  PRELIMINARY RESULTS* Echocardiogram Echocardiogram Transesophageal has been performed.  [name] 11/08/2013, 9:26 AM

## 2013-11-08 NOTE — Progress Notes (Signed)
Patient is having a TEE today.No radiation today.I will call and check on status later today.[name] RT on linac2 informed.

## 2013-11-08 NOTE — Anesthesia Postprocedure Evaluation (Signed)
  Anesthesia Post-op Note  Patient: [name]  Procedure(s) Performed: Procedure(s): TRANSESOPHAGEAL ECHOCARDIOGRAM (TEE) (N/A) CARDIOVERSION (N/A)  Patient Location: Endoscopy Unit  Anesthesia Type:MAC  Level of Consciousness: awake, alert  and oriented  Airway and Oxygen Therapy: Patient Spontanous Breathing and Patient connected to nasal cannula oxygen  Post-op Pain: none  Post-op Assessment: Post-op Vital signs reviewed, Patient's Cardiovascular Status Stable, Respiratory Function Stable, Patent Airway and No signs of Nausea or vomiting  Post-op Vital Signs: Reviewed and stable  Last Vitals:  Filed Vitals:   11/08/13 0745  BP: 154/106  Pulse: 132  Temp:   Resp: 15    Complications: No apparent anesthesia complications

## 2013-11-08 NOTE — Progress Notes (Signed)
     SUBJECTIVE: No chest pain or SOB  Tele: sinus  BP 128/87  Pulse 64  Temp(Src) 98.3 F (36.8 C) (Oral)  Resp 19  Ht 5' 4.5" (1.638 m)  Wt 259 lb 11.2 oz (117.8 kg)  BMI 43.91 kg/m2  SpO2 100%  Intake/Output Summary (Last 24 hours) at 11/08/13 1009 Last data filed at 11/07/13 2344  Gross per 24 hour  Intake    600 ml  Output    650 ml  Net    -50 ml    PHYSICAL EXAM General: Well developed, well nourished, in no acute distress. Alert and oriented x 3.  Psych:  Good affect, responds appropriately Neck: No JVD. No masses noted.  Lungs: Clear bilaterally with no wheezes or rhonci noted.  Heart: RRR with no murmurs noted. Abdomen: Bowel sounds are present. Soft, non-tender.  Extremities: No lower extremity edema.   LABS: Basic Metabolic Panel:  Recent Labs  11/07/13 0456 11/08/13 0539  NA 144 145  K 4.1 4.1  CL 107 108  CO2 24 24  GLUCOSE 154* 136*  BUN 24* 21  CREATININE 1.26* 1.22*  CALCIUM 8.1* 8.3*   CBC:  Recent Labs  11/07/13 0456 11/08/13 0539  WBC 4.8 4.4  HGB 10.7* 10.3*  HCT 32.6* 30.7*  MCV 81.5 81.4  PLT 234 205    Current Meds: . apixaban  5 mg Oral BID  . gabapentin  100 mg Oral TID  . insulin aspart  0-9 Units Subcutaneous TID WC & HS  . levothyroxine  125 mcg Oral QAC breakfast  . metoprolol tartrate  75 mg Oral BID  . pravastatin  40 mg Oral QHS  . sodium chloride  3 mL Intravenous Q12H    ASSESSMENT AND PLAN:  1. Atrial flutter with RVR: s/p TEE guided cardioversion this am to sinus rhythm. Will convert metoprolol to 100 mg po BID. Will stop heparin drip and start Eliquis 5 mg po BID for long term anti-coagulation. If she has recurrence of atrial flutter will need consideration for ablation. Will d/c ASA.   2. Mitral regurgitation: Moderate by TEE today. Mild by TTE September 2015. Will follow.   I would monitor today with change in medications and addition of Eliquis. Probable d/c tomorrow. Her long term cardiac follow  up will be with Dr. Haroldine Laws.   [name]  10/27/201510:09 AM

## 2013-11-08 NOTE — CV Procedure (Signed)
See full TEE report in [employer]; patient sedated by anesthesia with total of 445 mg diprovan IV. Normal LV function; Moderate LAE; no LAA thrombus; moderate to severe MR; patient subsequently had DCCV with 120 J to sinus rhythm; continue heparin/anticoagulation for at least 4 weeks [name]

## 2013-11-08 NOTE — Progress Notes (Addendum)
[facility] PROGRESS NOTE  [name] PQZ:[id number] DOB: [birth year] DOA: 11/03/2013  PCP: Rubbie Battiest, MD  Brief HPI: 58yo with PMH as below presented with chest pain and narrow complex tachycardia. On Trastuzumab for breast cancer which has cardiac side effects. She was noted to be atrial flutter. She underwent TEE/DCCV 10/27. Started on anticoagulation.  Past medical history:  Past Medical History  Diagnosis Date  . Hyperlipidemia   . Obesity   . Asthma     prn neb. and inhaler  . Osteoarthritis 2003    left knee  . Non-insulin dependent type 2 diabetes mellitus   . Graves' disease   . Ophthalmic manifestation of Graves disease   . Hypertension     on multiple meds., has been on med. > 14 yr.  . Dehydration 04/13/2013  . Breast cancer 01/2013    right  . Wears glasses   . GERD (gastroesophageal reflux disease)     Consultants: Cardiology, Oncology  Procedures:   TEE/DCCV 10/27 Normal LV function; Moderate LAE; no LAA thrombus; moderate to severe MR; patient subsequently had DCCV with 120 J to sinus rhythm   Antibiotics: None  Subjective: Patient feels well post TEE/DCCV. No chest pain/shortness of breath.  Objective: Vital Signs  Filed Vitals:   11/08/13 0745 11/08/13 0840 11/08/13 0843 11/08/13 0850  BP: 154/106 108/65  128/87  Jackson: 132 69  64  Temp:   98.3 F (36.8 C)   TempSrc:   Oral   Resp: 15 23  19   Height:      Weight:      SpO2: 96% 100%  100%    Intake/Output Summary (Last 24 hours) at 11/08/13 1007 Last data filed at 11/07/13 2344  Gross per 24 hour  Intake    600 ml  Output    650 ml  Net    -50 ml   Filed Weights   11/05/13 0617 11/06/13 0527 11/08/13 0546  Weight: 115.304 kg (254 lb 3.2 oz) 117.482 kg (259 lb) 117.8 kg (259 lb 11.2 oz)    General appearance: alert, cooperative, appears stated age, no distress and moderately obese Resp: clear to auscultation bilaterally Cardio: s1s2 regular, no s3s4. no rubs,  bruits. systolic murmur at apex. GI: soft, non-tender; bowel sounds normal; no masses,  no organomegaly Neurologic: no focal deficits.  Lab Results:  Basic Metabolic Panel:  Recent Labs Lab 11/04/13 0513 11/05/13 0305 11/06/13 0356 11/07/13 0456 11/08/13 0539  NA 141 141 142 144 145  K 3.9 4.0 4.5 4.1 4.1  CL 103 104 107 107 108  CO2 27 23 22 24 24   GLUCOSE 132* 132* 120* 154* 136*  BUN 16 21 24* 24* 21  CREATININE 1.10 1.18* 1.26* 1.26* 1.22*  CALCIUM 8.9 8.9 8.2* 8.1* 8.3*   Liver Function Tests:  Recent Labs Lab 11/04/13 0513  AST 16  ALT 12  ALKPHOS 85  BILITOT 0.3  PROT 6.7  ALBUMIN 3.4*   CBC:  Recent Labs Lab 11/03/13 2014  11/04/13 0513 11/05/13 0305 11/06/13 0356 11/07/13 0456 11/08/13 0539  WBC 5.6  < > 4.3 3.8* 4.7 4.8 4.4  NEUTROABS 3.8  --  2.6  --   --   --   --   HGB 11.7*  < > 10.9* 12.2 11.5* 10.7* 10.3*  HCT 35.9*  < > 33.2* 35.1* 34.1* 32.6* 30.7*  MCV 80.1  < > 80.6 79.6 80.6 81.5 81.4  PLT 224  < > 204 302 221 234  205  < > = values in this interval not displayed. Cardiac Enzymes:  Recent Labs Lab 11/04/13 0019 11/04/13 0535 11/04/13 1114  TROPONINI <0.30 <0.30 <0.30   BNP (last 3 results)  Recent Labs  11/04/13 0220  PROBNP 732.5*   CBG:  Recent Labs Lab 11/07/13 0702 11/07/13 1125 11/07/13 1702 11/07/13 2115 11/08/13 0612  GLUCAP 112* 120* 138* 109* 131*    No results found for this or any previous visit (from the past 240 hour(s)).    Studies/Results: No results found.  Medications:  Scheduled: . apixaban  5 mg Oral BID  . aspirin  81 mg Oral Daily  . gabapentin  100 mg Oral TID  . insulin aspart  0-9 Units Subcutaneous TID WC & HS  . levothyroxine  125 mcg Oral QAC breakfast  . metoprolol tartrate  75 mg Oral BID  . pravastatin  40 mg Oral QHS  . sodium chloride  3 mL Intravenous Q12H   Continuous: . sodium chloride Stopped (11/07/13 1304)   PRN:  Assessment/Plan:  Principal Problem:    Narrow complex tachycardia Active Problems:   Diabetes mellitus, type 2   Breast cancer of upper-outer quadrant of right female breast   Hypothyroidism   Chest pain   Atrial flutter   Mitral regurgitation    Chest Pain/Narrow Complex Tachycardia/Atrial Flutter with 2:1 Block  Cardiology following and managing. No further CP. Trops were normal. On Metoprolol. Had ECHO recently with normal systolic function. TSH 3.2. Status post TEE and cardioversion and now in SR. Started on Eliquis. Monitor for 24 hours. Discussed with Dr. Julianne Handler.  History of Breast Cancer  Status post s/p lumpectomy in July. Currently undergoing radiation and chemotherapy with Trastuzumab every 3 weeks. Appreciate Dr. Virgie Dad input. Holding chemo for now. She can hopefully resume radiation in next day or so.  Essential HTN  BP stable.   DM2 SSI. A1C 6.7. Metformin on hold.   History of Asthma  Stable.   Normocytic Anemia Hgb is stable.   Mildly Elevated Creatinine Remains stable. [place] for now. She is making urine.  Hypothyroidism Continue home meds.  DVT Prophylaxis: IV heparin changed to Eliquis. Code Status: Full Code  Family Communication: Discussed with patient and daughter Disposition Plan: Possible DC 10/28 if she remains in [place].     LOS: 5 days   [facility] Pager [phone] 11/08/2013, 10:07 AM  If 8PM-8AM, please contact night-coverage at [email or web address], password [facility]

## 2013-11-08 NOTE — Transfer of Care (Signed)
Immediate Anesthesia Transfer of Care Note  Patient: [name]  Procedure(s) Performed: Procedure(s): TRANSESOPHAGEAL ECHOCARDIOGRAM (TEE) (N/A) CARDIOVERSION (N/A)  Patient Location: Endoscopy Unit  Anesthesia Type:MAC  Level of Consciousness: awake, alert  and oriented  Airway & Oxygen Therapy: Patient Spontanous Breathing and Patient connected to nasal cannula oxygen  Post-op Assessment: Report given to PACU RN, Post -op Vital signs reviewed and stable and Patient moving all extremities X 4  Post vital signs: Reviewed and stable  Complications: No apparent anesthesia complications

## 2013-11-08 NOTE — Anesthesia Procedure Notes (Signed)
Procedure Name: MAC Date/Time: 11/08/2013 8:00 AM Performed by: Carola Frost Pre-anesthesia Checklist: Patient identified, Timeout performed, Emergency Drugs available, Suction available and Patient being monitored Patient Re-evaluated:Patient Re-evaluated prior to inductionOxygen Delivery Method: Nasal cannula Intubation Type: IV induction Placement Confirmation: positive ETCO2 and breath sounds checked- equal and bilateral Dental Injury: Teeth and Oropharynx as per pre-operative assessment

## 2013-11-08 NOTE — Anesthesia Preprocedure Evaluation (Addendum)
Anesthesia Evaluation  Patient identified by MRN, date of birth, ID band Patient awake    Reviewed: Allergy & Precautions, H&P , NPO status , Patient's Chart, lab work & pertinent test results, reviewed documented beta blocker date and time   Airway Mallampati: III  TM Distance: >3 FB Neck ROM: Full    Dental  (+) Poor Dentition, Dental Advisory Given   Pulmonary asthma , former smoker,          Cardiovascular hypertension, Pt. on medications and Pt. on home beta blockers     Neuro/Psych    GI/Hepatic GERD-  ,  Endo/Other  diabetes, Type 2, Oral Hypoglycemic AgentsHypothyroidism Morbid obesity  Renal/GU Renal InsufficiencyRenal disease     Musculoskeletal  (+) Arthritis -,   Abdominal   Peds  Hematology  (+) anemia ,   Anesthesia Other Findings   Reproductive/Obstetrics                            Anesthesia Physical Anesthesia Plan  ASA: III  Anesthesia Plan: MAC   Post-op Pain Management:    Induction: Intravenous  Airway Management Planned: Nasal Cannula  Additional Equipment:   Intra-op Plan:   Post-operative Plan:   Informed Consent: I have reviewed the patients History and Physical, chart, labs and discussed the procedure including the risks, benefits and alternatives for the proposed anesthesia with the patient or authorized representative who has indicated his/her understanding and acceptance.   Dental advisory given  Plan Discussed with: Anesthesiologist, Surgeon and CRNA  Anesthesia Plan Comments:        Anesthesia Quick Evaluation

## 2013-11-09 ENCOUNTER — Ambulatory Visit: Payer: Medicare Other

## 2013-11-09 ENCOUNTER — Telehealth: Payer: Self-pay

## 2013-11-09 DIAGNOSIS — E118 Type 2 diabetes mellitus with unspecified complications: Secondary | ICD-10-CM

## 2013-11-09 LAB — GLUCOSE, CAPILLARY
Glucose-Capillary: 118 mg/dL — ABNORMAL HIGH (ref 70–99)
Glucose-Capillary: 129 mg/dL — ABNORMAL HIGH (ref 70–99)

## 2013-11-09 LAB — BASIC METABOLIC PANEL
Anion gap: 12 (ref 5–15)
BUN: 21 mg/dL (ref 6–23)
CALCIUM: 8.5 mg/dL (ref 8.4–10.5)
CO2: 25 mEq/L (ref 19–32)
Chloride: 107 mEq/L (ref 96–112)
Creatinine, Ser: 1.3 mg/dL — ABNORMAL HIGH (ref 0.50–1.10)
GFR calc Af Amer: 51 mL/min — ABNORMAL LOW (ref >90)
GFR, EST NON AFRICAN AMERICAN: 44 mL/min — AB (ref >90)
Glucose, Bld: 115 mg/dL — ABNORMAL HIGH (ref 70–99)
Potassium: 4.3 mEq/L (ref 3.7–5.3)
Sodium: 144 mEq/L (ref 137–147)

## 2013-11-09 LAB — CBC
HCT: 29.3 % — ABNORMAL LOW (ref 36.0–46.0)
HEMATOCRIT: 28.4 % — AB (ref 36.0–46.0)
Hemoglobin: 9.3 g/dL — ABNORMAL LOW (ref 12.0–15.0)
Hemoglobin: 9.5 g/dL — ABNORMAL LOW (ref 12.0–15.0)
MCH: 27.7 pg (ref 26.0–34.0)
MCH: 26.6 pg (ref 26.0–34.0)
MCHC: 32.4 g/dL (ref 30.0–36.0)
MCHC: 32.7 g/dL (ref 30.0–36.0)
MCV: 82.1 fL (ref 78.0–100.0)
MCV: 84.5 fL (ref 78.0–100.0)
PLATELETS: 185 10*3/uL (ref 150–400)
Platelets: 188 10*3/uL (ref 150–400)
RBC: 3.36 MIL/uL — ABNORMAL LOW (ref 3.87–5.11)
RBC: 3.57 MIL/uL — ABNORMAL LOW (ref 3.87–5.11)
RDW: 17.5 % — AB (ref 11.5–15.5)
RDW: 17.6 % — AB (ref 11.5–15.5)
WBC: 4.3 10*3/uL (ref 4.0–10.5)
WBC: 4.8 10*3/uL (ref 4.0–10.5)

## 2013-11-09 MED ORDER — GUAIFENESIN 100 MG/5ML PO SYRP: 200.0000 mg | ORAL_SOLUTION | ORAL | Status: DC | PRN

## 2013-11-09 MED ORDER — HEPARIN SOD (PORK) LOCK FLUSH 100 UNIT/ML IV SOLN
500.0000 [IU] | INTRAVENOUS | Status: AC | PRN
  Administered 2013-11-09: 500 [IU]

## 2013-11-09 MED ORDER — ACETAMINOPHEN 325 MG PO TABS: 650.0000 mg | ORAL_TABLET | Freq: Four times a day (QID) | ORAL | Status: DC | PRN

## 2013-11-09 MED ORDER — SODIUM CHLORIDE 0.9 % IJ SOLN
10.0000 mL | INTRAMUSCULAR | Status: DC | PRN
  Administered 2013-11-09: 10 mL

## 2013-11-09 MED ORDER — METOPROLOL TARTRATE 25 MG PO TABS: 75.0000 mg | ORAL_TABLET | Freq: Two times a day (BID) | ORAL | Status: DC

## 2013-11-09 MED ORDER — APIXABAN 5 MG PO TABS: 5.0000 mg | ORAL_TABLET | Freq: Two times a day (BID) | ORAL | Status: DC

## 2013-11-09 NOTE — Telephone Encounter (Signed)
Patient for possible discharge today, awaiting rounding on attending.No radiation treatment today and possibly tomorrow.May resume Friday or Monday.Informed Doristine Johns RN research of tentative discharge and if there is a time frame for which treatments should be complete by certain time.She will let me know and I will discuss with patient on tomorrow as to when she must resume radiation.

## 2013-11-09 NOTE — Progress Notes (Signed)
     SUBJECTIVE: No chest pain, SOB or palpitations.   Tele: NSR, reviewed by me   BP 140/86  Pulse 60  Temp(Src) 98.5 F (36.9 C) (Oral)  Resp 18  Ht 5' 4.5" (1.638 m)  Wt 260 lb 9.6 oz (118.207 kg)  BMI 44.06 kg/m2  SpO2 99%  Intake/Output Summary (Last 24 hours) at 11/09/13 8850 Last data filed at 11/08/13 2116  Gross per 24 hour  Intake    840 ml  Output    500 ml  Net    340 ml    PHYSICAL EXAM General: Well developed, well nourished, in no acute distress. Alert and oriented x 3.  Psych:  Good affect, responds appropriately Neck: No JVD. No masses noted.  Lungs: Clear bilaterally with no wheezes or rhonci noted.  Heart: RRR with no murmurs noted. Abdomen: Bowel sounds are present. Soft, non-tender.  Extremities: No lower extremity edema.   LABS: Basic Metabolic Panel:  Recent Labs  11/08/13 0539 11/09/13 0515  NA 145 144  K 4.1 4.3  CL 108 107  CO2 24 25  GLUCOSE 136* 115*  BUN 21 21  CREATININE 1.22* 1.30*  CALCIUM 8.3* 8.5   CBC:  Recent Labs  11/08/13 2355 11/09/13 0515  WBC 4.8 4.3  HGB 9.5* 9.3*  HCT 29.3* 28.4*  MCV 82.1 84.5  PLT 185 188   Current Meds: . apixaban  5 mg Oral BID  . gabapentin  100 mg Oral TID  . insulin aspart  0-9 Units Subcutaneous TID WC & HS  . levothyroxine  125 mcg Oral QAC breakfast  . metoprolol tartrate  75 mg Oral BID  . pravastatin  40 mg Oral QHS  . sodium chloride  3 mL Intravenous Q12H    ASSESSMENT AND PLAN:  1. Atrial flutter with RVR: s/p TEE guided cardioversion 11/08/13 with conversion to sinus rhythm. Continue metoprolol 75 mg po BID. Continue Eliquis 5 mg po BID for long term anti-coagulation. If she has recurrence of atrial flutter will need consideration for ablation. ASA was stopped.   2. Mitral regurgitation: Moderate by TEE today. Mild by TTE September 2015. Will follow.   3. HTN: I would use metoprolol instead of labetalol which she had been on at home. Will need to restart Norvasc  and ARB which is part of her home regimen.   OK to discharge home today. I will arrange follow up in our [facility] office where she has been followed in the past.     Leontine Radman  10/28/20157:27 AM

## 2013-11-09 NOTE — Discharge Summary (Signed)
Physician Discharge Summary  [name] IRC:[id number] DOB: [birth year] DOA: 11/03/2013  PCP: Rubbie Battiest, MD  Admit date: 11/03/2013 Discharge date: 11/09/2013  Time spent: 30 minutes  Recommendations for Outpatient Follow-up:  1. Follow up with cardiology and oncology as recommended.   Discharge Diagnoses:  Principal Problem:   Narrow complex tachycardia Active Problems:   Diabetes mellitus, type 2   Breast cancer of upper-outer quadrant of right female breast   Hypothyroidism   Chest pain   Atrial flutter   Mitral regurgitation   Discharge Condition: imrpoved  Diet recommendation: carb modified diet.   Filed Weights   11/06/13 0527 11/08/13 0546 11/09/13 0426  Weight: 117.482 kg (259 lb) 117.8 kg (259 lb 11.2 oz) 118.207 kg (260 lb 9.6 oz)    History of present illness:  58yo with PMH as below presented with chest pain and narrow complex tachycardia. On Trastuzumab for breast cancer which has cardiac side effects. She was noted to be atrial flutter. She underwent TEE/DCCV 10/27. Started on anticoagulation.  Hospital Course:   Chest Pain/Narrow Complex Tachycardia/Atrial Flutter with 2:1 Block  No further CP. Trops were normal. On Metoprolol. Had ECHO recently with normal systolic function. TSH 3.2. Status post TEE and cardioversion and now in SR. Started on Eliquis.discharge and follow up.   History of Breast Cancer  Status post s/p lumpectomy in July. Currently undergoing radiation and chemotherapy with Trastuzumab every 3 weeks. Appreciate Dr. Virgie Dad input.  She can hopefully resume radiation in next day or so.  Essential HTN  BP stable.   DM2 SSI. A1C 6.7.   History of Asthma  Stable.   Normocytic Anemia Hgb is stable.   Mildly Elevated Creatinine Remains stable. [place] for now. She is making urine.  Hypothyroidism Continue home meds.  Procedures: TEE/DCCV 10/27 Normal LV function; Moderate LAE; no LAA thrombus; moderate to severe MR;  patient subsequently had DCCV with 120 J to sinus rhythm  Consultations:  Cardiology  oncology  Discharge Exam: Filed Vitals:   11/09/13 0426  BP: 140/86  Pulse: 60  Temp: 98.5 F (36.9 C)  Resp: 18    General: alert afebrile comfortable Cardiovascular: s1s2 Respiratory: ctab  Discharge Instructions You were cared for by a hospitalist during your hospital stay. If you have any questions about your discharge medications or the care you received while you were in the hospital after you are discharged, you can call the unit and asked to speak with the hospitalist on call if the hospitalist that took care of you is not available. Once you are discharged, your primary care physician will handle any further medical issues. Please note that NO REFILLS for any discharge medications will be authorized once you are discharged, as it is imperative that you return to your primary care physician (or establish a relationship with a primary care physician if you do not have one) for your aftercare needs so that they can reassess your need for medications and monitor your lab values.  Discharge Instructions   Diet - low sodium heart healthy    Complete by:  As directed      Discharge instructions    Complete by:  As directed   Follow up with cardiology as recommended Follow up with PCP in 2 weeks          Current Discharge Medication List    START taking these medications   Details  apixaban (ELIQUIS) 5 MG TABS tablet Take 1 tablet (5 mg total) by mouth 2 (two)  times daily. Qty: 60 tablet, Refills: 1    guaifenesin (ROBITUSSIN) 100 MG/5ML syrup Take 10 mLs (200 mg total) by mouth every 4 (four) hours as needed for congestion. Qty: 120 mL, Refills: 0    metoprolol tartrate (LOPRESSOR) 25 MG tablet Take 3 tablets (75 mg total) by mouth 2 (two) times daily. Qty: 60 tablet, Refills: 1      CONTINUE these medications which have CHANGED   Details  acetaminophen (TYLENOL) 325 MG tablet  Take 2 tablets (650 mg total) by mouth every 6 (six) hours as needed for mild pain or headache.      CONTINUE these medications which have NOT CHANGED   Details  albuterol (PROVENTIL HFA;VENTOLIN HFA) 108 (90 BASE) MCG/ACT inhaler Inhale 2 puffs into the lungs every 4 (four) hours as needed. Qty: 18 g, Refills: 2    amLODipine (NORVASC) 10 MG tablet Take 10 mg by mouth daily.    Artificial Tear Ointment (ARTIFICIAL TEARS) ointment Place 1 drop into both eyes as needed (for grey eye disease).     benazepril (LOTENSIN) 40 MG tablet Take 40 mg by mouth daily.     calcium carbonate (TUMS EX) 750 MG chewable tablet Chew 2 tablets by mouth 4 (four) times daily.    cholecalciferol (VITAMIN D) 1000 UNITS tablet Take 1,000 Units by mouth daily.    cycloSPORINE (RESTASIS) 0.05 % ophthalmic emulsion Place 1 drop into both eyes 2 (two) times daily.     gabapentin (NEURONTIN) 100 MG capsule Take 1 capsule (100 mg total) by mouth 3 (three) times daily. Qty: 90 capsule, Refills: 5    levothyroxine (SYNTHROID, LEVOTHROID) 125 MCG tablet Take 1 tablet (125 mcg total) by mouth daily before breakfast. Qty: 30 tablet, Refills: 1    metFORMIN (GLUCOPHAGE) 500 MG tablet Take 500 mg by mouth 2 (two) times daily with a meal.    pravastatin (PRAVACHOL) 80 MG tablet Take 40 mg by mouth at bedtime.    vitamin B-12 (CYANOCOBALAMIN) 500 MCG tablet Take 500 mcg by mouth daily.      STOP taking these medications     albuterol (PROVENTIL) (2.5 MG/3ML) 0.083% nebulizer solution      aspirin 81 MG tablet      hydrochlorothiazide (HYDRODIURIL) 25 MG tablet      ibuprofen (ADVIL,MOTRIN) 200 MG tablet      labetalol (NORMODYNE) 300 MG tablet      cloNIDine (CATAPRES - DOSED IN MG/24 HR) 0.2 mg/24hr patch        Allergies  Allergen Reactions  . Procardia [Nifedipine] Other (See Comments)    MIGRAINES  . Diltiazem Itching   Follow-up Information   Follow up with Rubbie Battiest, MD. Schedule an  appointment as soon as possible for a visit in 2 weeks.   Specialty:  Family Medicine   Contact information:   [address] [place] [address] [phone]        The results of significant diagnostics from this hospitalization (including imaging, microbiology, ancillary and laboratory) are listed below for reference.    Significant Diagnostic Studies: Ct Angio Chest Pe W/cm &/or Wo Cm  11/03/2013   CLINICAL DATA:  Mid chest pain starting 3 p.m. today, shortness of Breath  EXAM: CT ANGIOGRAPHY CHEST WITH CONTRAST  TECHNIQUE: Multidetector CT imaging of the chest was performed using the standard protocol during bolus administration of intravenous contrast. Multiplanar CT image reconstructions and MIPs were obtained to evaluate the vascular anatomy.  CONTRAST:  18mL OMNIPAQUE IOHEXOL  350 MG/ML SOLN  COMPARISON:  02/26/2011  FINDINGS: There is significant skin thickening right breast measures up to 1 cm. Inflammatory or neoplastic disease cannot be excluded. Clinical correlation is necessary.  Sagittal images of the spine shows degenerative changes thoracic spine. No destructive bony lesions are noted.  Surgical clips are noted at the level of thoracic inlet probable post thyroid surgery. Central airways are patent. No aortic aneurysm. Mild atherosclerotic calcifications of thoracic aorta. There is cardiomegaly.  No pulmonary embolus is identified.  The visualized upper abdomen shows no adrenal gland mass.  Images of the lung parenchyma shows no acute infiltrate or pulmonary edema. No pulmonary nodules are noted. There is no evidence of pneumothorax.  There is no mediastinal hematoma or adenopathy. No hilar adenopathy. No bronchiectasis.  Review of the MIP images confirms the above findings.  IMPRESSION: 1. No pulmonary embolus is noted. 2. No acute infiltrate or pulmonary edema. No mediastinal hematoma or adenopathy. 3. Significant skin thickening right breast up to 1 cm. Inflammatory or  neoplastic metastatic disease cannot be excluded. Clinical correlation is necessary. 4. Degenerative changes thoracic spine.   Electronically Signed   By: Lahoma Crocker M.D.   On: 11/03/2013 22:33   Dg Chest Port 1 View  11/03/2013   CLINICAL DATA:  Chest pain and shortness of breath for 1 day  EXAM: PORTABLE CHEST - 1 VIEW  COMPARISON:  September 06, 2013  FINDINGS: The heart size and mediastinal contours are stable. Right central venous line is unchanged. Surgical clips are identified in the superior mediastinum. There is no focal infiltrate, pulmonary edema, or pleural effusion. The visualized skeletal structures are stable.  IMPRESSION: No active cardiopulmonary disease.   Electronically Signed   By: Abelardo Diesel M.D.   On: 11/03/2013 20:30    Microbiology: No results found for this or any previous visit (from the past 240 hour(s)).   Labs: Basic Metabolic Panel:  Recent Labs Lab 11/05/13 0305 11/06/13 0356 11/07/13 0456 11/08/13 0539 11/09/13 0515  NA 141 142 144 145 144  K 4.0 4.5 4.1 4.1 4.3  CL 104 107 107 108 107  CO2 23 22 24 24 25   GLUCOSE 132* 120* 154* 136* 115*  BUN 21 24* 24* 21 21  CREATININE 1.18* 1.26* 1.26* 1.22* 1.30*  CALCIUM 8.9 8.2* 8.1* 8.3* 8.5   Liver Function Tests:  Recent Labs Lab 11/04/13 0513  AST 16  ALT 12  ALKPHOS 85  BILITOT 0.3  PROT 6.7  ALBUMIN 3.4*   No results found for this basename: LIPASE, AMYLASE,  in the last 168 hours No results found for this basename: AMMONIA,  in the last 168 hours CBC:  Recent Labs Lab 11/03/13 2014  11/04/13 0513  11/06/13 0356 11/07/13 0456 11/08/13 0539 11/08/13 2355 11/09/13 0515  WBC 5.6  < > 4.3  < > 4.7 4.8 4.4 4.8 4.3  NEUTROABS 3.8  --  2.6  --   --   --   --   --   --   HGB 11.7*  < > 10.9*  < > 11.5* 10.7* 10.3* 9.5* 9.3*  HCT 35.9*  < > 33.2*  < > 34.1* 32.6* 30.7* 29.3* 28.4*  MCV 80.1  < > 80.6  < > 80.6 81.5 81.4 82.1 84.5  PLT 224  < > 204  < > 221 234 205 185 188  < > = values in  this interval not displayed. Cardiac Enzymes:  Recent Labs Lab 11/04/13 0019 11/04/13 0535 11/04/13  [place] <0.30 <0.30 <0.30   BNP: BNP (last 3 results)  Recent Labs  11/04/13 0220  PROBNP 732.5*   CBG:  Recent Labs Lab 11/08/13 1630 11/08/13 2109 11/08/13 2352 11/09/13 0614 11/09/13 1117  GLUCAP 126* 135* 149* 118* 129*       Signed:  Velencia [name]  [facility] 11/09/2013, 12:12 PM

## 2013-11-10 ENCOUNTER — Ambulatory Visit: Payer: Medicare Other

## 2013-11-10 ENCOUNTER — Encounter (HOSPITAL_COMMUNITY): Payer: Self-pay | Admitting: Cardiology

## 2013-11-10 ENCOUNTER — Telehealth: Payer: Self-pay

## 2013-11-10 NOTE — Telephone Encounter (Signed)
Patient called to inform me that she will not be in for treatment today but will resume on tomorrow.[name] on linac 1514 informed and will call me to come assess patient's skin prior to treatment.Patient states skin irritated when tape remove from port site on discharge.

## 2013-11-11 ENCOUNTER — Ambulatory Visit
Admission: RE | Admit: 2013-11-11 | Discharge: 2013-11-11 | Disposition: A | Discharge status: Home / Self Care | Payer: Medicare Other | Source: Ambulatory Visit | Attending: Radiation Oncology | Admitting: Radiation Oncology

## 2013-11-11 DIAGNOSIS — Z510 Encounter for antineoplastic radiation therapy: Secondary | ICD-10-CM | POA: Diagnosis not present

## 2013-11-11 DIAGNOSIS — C50411 Malignant neoplasm of upper-outer quadrant of right female breast: Secondary | ICD-10-CM | POA: Diagnosis not present

## 2013-11-11 MED ORDER — BIAFINE EX EMUL
CUTANEOUS | Status: DC | PRN
  Administered 2013-11-11: 10:00:00 via TOPICAL

## 2013-11-14 ENCOUNTER — Ambulatory Visit: Payer: Medicare Other

## 2013-11-14 ENCOUNTER — Ambulatory Visit
Admission: RE | Admit: 2013-11-14 | Discharge: 2013-11-14 | Disposition: A | Discharge status: Home / Self Care | Payer: Medicare Other | Source: Ambulatory Visit | Attending: Radiation Oncology | Admitting: Radiation Oncology

## 2013-11-14 DIAGNOSIS — Z510 Encounter for antineoplastic radiation therapy: Secondary | ICD-10-CM | POA: Diagnosis not present

## 2013-11-15 ENCOUNTER — Ambulatory Visit: Payer: Medicare Other

## 2013-11-15 ENCOUNTER — Ambulatory Visit
Admission: RE | Admit: 2013-11-15 | Discharge: 2013-11-15 | Disposition: A | Discharge status: Home / Self Care | Payer: Medicare Other | Source: Ambulatory Visit | Attending: Radiation Oncology | Admitting: Radiation Oncology

## 2013-11-15 VITALS — BP 182/92 | HR 58 | Temp 98.0°F | Wt 255.2 lb

## 2013-11-15 DIAGNOSIS — C50411 Malignant neoplasm of upper-outer quadrant of right female breast: Secondary | ICD-10-CM

## 2013-11-15 DIAGNOSIS — Z510 Encounter for antineoplastic radiation therapy: Secondary | ICD-10-CM | POA: Diagnosis not present

## 2013-11-15 NOTE — Progress Notes (Signed)
Weekly Management Note Current Dose: 42  Gy  Projected Dose: 62 Gy   Narrative:  The patient presents for routine under treatment assessment.  CBCT/MVCT images/Port film x-rays were reviewed.  The chart was checked. Hospitalized for SVT. Missed 5 RT. Now s/p cardioversion. Peeling on anterior chest where tape was during hospitalization. Feels "tired" BP up due to not taking BP med. No chest pain or palpitations.   Physical Findings: Weight: 255 lb 3.2 oz (115.758 kg). Areas of debridement in anterior superior chest. Brown breast. No desquamation.   Impression:  The patient is tolerating radiation.  Plan:  Continue treatment as planned. Using biafene. Has appts pending with cardiology. Neosporin to open areas on chest.

## 2013-11-16 ENCOUNTER — Ambulatory Visit: Payer: Medicare Other

## 2013-11-16 DIAGNOSIS — Z510 Encounter for antineoplastic radiation therapy: Secondary | ICD-10-CM | POA: Diagnosis not present

## 2013-11-17 ENCOUNTER — Ambulatory Visit: Payer: Medicare Other

## 2013-11-17 DIAGNOSIS — Z510 Encounter for antineoplastic radiation therapy: Secondary | ICD-10-CM | POA: Diagnosis not present

## 2013-11-18 ENCOUNTER — Ambulatory Visit
Admission: RE | Admit: 2013-11-18 | Discharge: 2013-11-18 | Disposition: A | Discharge status: Home / Self Care | Payer: Medicare Other | Source: Ambulatory Visit | Attending: Radiation Oncology | Admitting: Radiation Oncology

## 2013-11-18 ENCOUNTER — Ambulatory Visit: Payer: Medicare Other

## 2013-11-18 DIAGNOSIS — Z510 Encounter for antineoplastic radiation therapy: Secondary | ICD-10-CM | POA: Diagnosis not present

## 2013-11-21 ENCOUNTER — Ambulatory Visit: Payer: Medicare Other

## 2013-11-21 ENCOUNTER — Ambulatory Visit
Admission: RE | Admit: 2013-11-21 | Discharge: 2013-11-21 | Disposition: A | Discharge status: Home / Self Care | Payer: Medicare Other | Source: Ambulatory Visit | Attending: Radiation Oncology | Admitting: Radiation Oncology

## 2013-11-21 DIAGNOSIS — Z510 Encounter for antineoplastic radiation therapy: Secondary | ICD-10-CM | POA: Diagnosis not present

## 2013-11-22 ENCOUNTER — Ambulatory Visit: Payer: Medicare Other

## 2013-11-22 ENCOUNTER — Encounter: Payer: Self-pay | Admitting: Radiation Oncology

## 2013-11-22 ENCOUNTER — Ambulatory Visit
Admission: RE | Admit: 2013-11-22 | Discharge: 2013-11-22 | Disposition: A | Discharge status: Home / Self Care | Payer: Medicare Other | Source: Ambulatory Visit | Attending: Radiation Oncology | Admitting: Radiation Oncology

## 2013-11-22 ENCOUNTER — Telehealth: Payer: Self-pay | Admitting: *Deleted

## 2013-11-22 VITALS — BP 146/64 | HR 63 | Temp 98.6°F | Wt 256.2 lb

## 2013-11-22 DIAGNOSIS — Z510 Encounter for antineoplastic radiation therapy: Secondary | ICD-10-CM | POA: Diagnosis not present

## 2013-11-22 DIAGNOSIS — C50411 Malignant neoplasm of upper-outer quadrant of right female breast: Secondary | ICD-10-CM

## 2013-11-22 NOTE — Telephone Encounter (Signed)
Called patient home and cell phone, treatment for 0945, no show as yet, left voice messages on both phones to give [place] a call if running late, or cannot make treatment today 10:11 AM

## 2013-11-22 NOTE — Progress Notes (Signed)
Weekly assessment of radiation to right [employer].ch 26 of 31 fractions.skin vey hyperpigmented with healing area of peeling of inframmary fold without drainage.Continue application of neosporin to fold and biafine to breat.Generalized weakness and fatigue.

## 2013-11-22 NOTE — Addendum Note (Signed)
Encounter addended by: Thea Silversmith, MD on: 11/22/2013  2:06 PM<BR>     Documentation filed: Notes Section

## 2013-11-22 NOTE — Progress Notes (Signed)
Weekly Management Note Current Dose:  52 Gy  Projected Dose: 62 Gy   Narrative:  The patient presents for routine under treatment assessment.  CBCT/MVCT images/Port film x-rays were reviewed.  The chart was checked.Doing well. No complaints except soreness around nipple. Appt with Magrinat next week.   Physical Findings: Weight: 256 lb 3.2 oz (116.212 kg). Hyperpigmented breast. Peeling in inframammary fold.   Impression:  The patient is tolerating radiation.  Plan:  Continue treatment as planned. Continue radiaplex.

## 2013-11-22 NOTE — Progress Notes (Signed)
  Radiation Oncology         [phone] ________________________________  Name: [name] MRN: [id number]  Date: 11/22/2013  DOB: Dec 19, 1955  Simulation Verification Note  Status: outpatient  NARRATIVE: The patient was brought to the treatment unit and placed in the planned treatment position. The clinical setup was verified. Then port films were obtained and uploaded to the radiation oncology medical record software.  The treatment beams were carefully compared against the planned radiation fields. The position location and shape of the radiation fields was reviewed. The targeted volume of tissue appears appropriately covered by the radiation beams. Organs at risk appear to be excluded as planned.  Based on my personal review, I approved the simulation verification. The patient's treatment will proceed as planned.  ------------------------------------------------  Thea Silversmith, MD

## 2013-11-23 ENCOUNTER — Ambulatory Visit
Admission: RE | Admit: 2013-11-23 | Discharge: 2013-11-23 | Disposition: A | Discharge status: Home / Self Care | Payer: Medicare Other | Source: Ambulatory Visit | Attending: Radiation Oncology | Admitting: Radiation Oncology

## 2013-11-23 ENCOUNTER — Ambulatory Visit: Payer: Medicare Other

## 2013-11-23 DIAGNOSIS — Z510 Encounter for antineoplastic radiation therapy: Secondary | ICD-10-CM | POA: Diagnosis not present

## 2013-11-24 ENCOUNTER — Ambulatory Visit
Admission: RE | Admit: 2013-11-24 | Discharge: 2013-11-24 | Disposition: A | Discharge status: Home / Self Care | Payer: Medicare Other | Source: Ambulatory Visit | Attending: Radiation Oncology | Admitting: Radiation Oncology

## 2013-11-24 ENCOUNTER — Ambulatory Visit: Payer: Medicare Other

## 2013-11-24 DIAGNOSIS — Z510 Encounter for antineoplastic radiation therapy: Secondary | ICD-10-CM | POA: Diagnosis not present

## 2013-11-25 ENCOUNTER — Ambulatory Visit: Payer: Medicare Other

## 2013-11-25 ENCOUNTER — Ambulatory Visit
Admission: RE | Admit: 2013-11-25 | Discharge: 2013-11-25 | Disposition: A | Discharge status: Home / Self Care | Payer: Medicare Other | Source: Ambulatory Visit | Attending: Radiation Oncology | Admitting: Radiation Oncology

## 2013-11-25 DIAGNOSIS — Z510 Encounter for antineoplastic radiation therapy: Secondary | ICD-10-CM | POA: Diagnosis not present

## 2013-11-28 ENCOUNTER — Ambulatory Visit
Admission: RE | Admit: 2013-11-28 | Discharge: 2013-11-28 | Disposition: A | Discharge status: Home / Self Care | Payer: Medicare Other | Source: Ambulatory Visit | Attending: Radiation Oncology | Admitting: Radiation Oncology

## 2013-11-28 ENCOUNTER — Ambulatory Visit: Payer: Medicare Other

## 2013-11-28 DIAGNOSIS — Z510 Encounter for antineoplastic radiation therapy: Secondary | ICD-10-CM | POA: Diagnosis not present

## 2013-11-29 ENCOUNTER — Encounter: Payer: Self-pay | Admitting: Radiation Oncology

## 2013-11-29 ENCOUNTER — Encounter: Payer: Self-pay | Admitting: *Deleted

## 2013-11-29 ENCOUNTER — Ambulatory Visit
Admission: RE | Admit: 2013-11-29 | Discharge: 2013-11-29 | Disposition: A | Discharge status: Home / Self Care | Payer: Medicare Other | Source: Ambulatory Visit | Attending: Radiation Oncology | Admitting: Radiation Oncology

## 2013-11-29 VITALS — BP 134/70 | HR 62 | Temp 98.9°F | Wt 256.8 lb

## 2013-11-29 DIAGNOSIS — Z510 Encounter for antineoplastic radiation therapy: Secondary | ICD-10-CM | POA: Insufficient documentation

## 2013-11-29 DIAGNOSIS — C50411 Malignant neoplasm of upper-outer quadrant of right female breast: Secondary | ICD-10-CM

## 2013-11-29 MED ORDER — BIAFINE EX EMUL
CUTANEOUS | Status: DC | PRN
  Administered 2013-11-29: 11:00:00 via TOPICAL

## 2013-11-29 MED ORDER — METOPROLOL TARTRATE 25 MG PO TABS: 75.0000 mg | ORAL_TABLET | Freq: Two times a day (BID) | ORAL | Status: DC

## 2013-11-29 NOTE — Progress Notes (Signed)
11/29/13 at 10:59am - End of radiation therapy visit - The pt was into the cancer center this morning for her final radiation treatment and her end of radiation assessments.  The pt was seen and examined today by Dr. Pablo Ledger.  The pt completed her questionnaires after her radiation treatment was completed.  The research nurse reviewed the questionnaires for accuracy and completeness.  The pt was thanked for her support of this clinical trial.  The pt was given her appt for her 30 days after her last radiation visit.   The pt's completed questionnaires will be faxed to [facility] today.  The research nurse reviewed all of the pt's radiation therapy notes.  The research nurse noted the pt's following reported AE's during the course of her radiation treatment:  fatigue, hyperpigmentation, and dermatitis radiation.  The research nurse asked Dr. Pablo Ledger to review the [facility] pages related to these reported AE's for grading and reporting purposes.  Dr. Pablo Ledger confirmed that the pt's AE's were all grade 1 toxicities.  The research nurse also contacted, Merryl Hacker (dosemetrist), and asked for her assistance in completing the RT CRF's.  The research nurse printed the [employer] CRF's for B-51 and gave them to Merryl Hacker to complete.    11/30/13 at 10:45am - The research nurse received the completed RT CRF's from [facility].  The research nurse then entered this data into the [facility].

## 2013-11-29 NOTE — Progress Notes (Signed)
Patient completes treatment of right breast.Skin is very dark, no wet desquamation.Inframammary fold has healed (shiny).Given another tube of biafine to apply twice daily for next 2 to 3 weeks than change to lotion with vitamin e.Givne one month follow up card and appointment for Cardiac Failure with Dr.Bensimhon on December 05, 2013 at 10 am.Patient was also scheduled for follow up with Dr.Luking this Friday at 2:00 pm.Given script for metoprolol tartrate to hold until seen in follow up.Patient and daughter know to call if any questions or concerns arise.

## 2013-11-29 NOTE — Progress Notes (Signed)
Weekly Management Note Current Dose: 62  Gy  Projected Dose: 62 Gy   Narrative:  The patient presents for routine under treatment assessment.  CBCT/MVCT images/Port film x-rays were reviewed.  The chart was checked. Doing well. Breast sore but feeling better. Needs refill on metoprolol before she sees Dr. Wolfgang Phoenix on Friday, Delta tomorrow or [facility] next week. Clinical trial paperwork completed.   Physical Findings: Weight: 256 lb 12.8 oz (116.484 kg). Unchanged. Dark breast. No moist desquamation. ECOG 0.  Impression:  The patient is tolerating radiation.  Plan:  Finishes today. Discussed post RT skin care. Follow up in 1 month. Call with questions. 10 day supply of metoprolol given.

## 2013-11-29 NOTE — Progress Notes (Signed)
  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   Beams/energy:   6 and 10 MV photons with electronic compensation were used on the initial fields. The plan was prescribed to the 100% isodose line. The boost plan was a photon plan due to the depth of her tumor cavity. Three fields were used with 6 and 10 MV photons again to the 100% isodose line.   Narrative: The patient tolerated radiation treatment relatively well.   She was delayed at the beginning of treatment due to a seroma infection for which she was hospitalized.  She then developed SVT during radiation and was hospitalized. Her skin held up well and she had the expected skin darkness but no moist desquamation.   Plan: The patient has completed radiation treatment. The patient will return to radiation oncology clinic for routine followup in one month. I advised them to call or return sooner if they have any questions or concerns related to their recovery or treatment.  ------------------------------------------------  Thea Silversmith, MD

## 2013-11-30 ENCOUNTER — Ambulatory Visit (HOSPITAL_BASED_OUTPATIENT_CLINIC_OR_DEPARTMENT_OTHER): Payer: Medicare Other

## 2013-11-30 ENCOUNTER — Encounter: Payer: Self-pay | Admitting: Nurse Practitioner

## 2013-11-30 ENCOUNTER — Telehealth: Payer: Self-pay | Admitting: Nurse Practitioner

## 2013-11-30 ENCOUNTER — Ambulatory Visit (HOSPITAL_BASED_OUTPATIENT_CLINIC_OR_DEPARTMENT_OTHER): Payer: Medicare Other | Admitting: Nurse Practitioner

## 2013-11-30 ENCOUNTER — Other Ambulatory Visit (HOSPITAL_BASED_OUTPATIENT_CLINIC_OR_DEPARTMENT_OTHER): Payer: Medicare Other

## 2013-11-30 VITALS — BP 144/72 | HR 64 | Temp 98.7°F | Resp 18 | Ht 64.5 in | Wt 259.4 lb

## 2013-11-30 DIAGNOSIS — C50411 Malignant neoplasm of upper-outer quadrant of right female breast: Secondary | ICD-10-CM

## 2013-11-30 DIAGNOSIS — Z170 Estrogen receptor positive status [ER+]: Secondary | ICD-10-CM

## 2013-11-30 DIAGNOSIS — Z5112 Encounter for antineoplastic immunotherapy: Secondary | ICD-10-CM

## 2013-11-30 LAB — COMPREHENSIVE METABOLIC PANEL (CC13)
ALT: 13 U/L (ref 0–55)
ANION GAP: 11 meq/L (ref 3–11)
AST: 19 U/L (ref 5–34)
Albumin: 3.9 g/dL (ref 3.5–5.0)
Alkaline Phosphatase: 90 U/L (ref 40–150)
BILIRUBIN TOTAL: 0.37 mg/dL (ref 0.20–1.20)
BUN: 16.2 mg/dL (ref 7.0–26.0)
CO2: 26 meq/L (ref 22–29)
Calcium: 9.2 mg/dL (ref 8.4–10.4)
Chloride: 108 mEq/L (ref 98–109)
Creatinine: 1.2 mg/dL — ABNORMAL HIGH (ref 0.6–1.1)
GLUCOSE: 181 mg/dL — AB (ref 70–140)
Potassium: 4.3 mEq/L (ref 3.5–5.1)
SODIUM: 145 meq/L (ref 136–145)
TOTAL PROTEIN: 7.2 g/dL (ref 6.4–8.3)

## 2013-11-30 LAB — CBC WITH DIFFERENTIAL/PLATELET
BASO%: 1 % (ref 0.0–2.0)
Basophils Absolute: 0 10*3/uL (ref 0.0–0.1)
EOS ABS: 0.2 10*3/uL (ref 0.0–0.5)
EOS%: 4.2 % (ref 0.0–7.0)
HEMATOCRIT: 35.7 % (ref 34.8–46.6)
HGB: 11.5 g/dL — ABNORMAL LOW (ref 11.6–15.9)
LYMPH%: 23 % (ref 14.0–49.7)
MCH: 27.3 pg (ref 25.1–34.0)
MCHC: 32.1 g/dL (ref 31.5–36.0)
MCV: 85.1 fL (ref 79.5–101.0)
MONO#: 0.4 10*3/uL (ref 0.1–0.9)
MONO%: 9.7 % (ref 0.0–14.0)
NEUT#: 2.5 10*3/uL (ref 1.5–6.5)
NEUT%: 62.1 % (ref 38.4–76.8)
Platelets: 229 10*3/uL (ref 145–400)
RBC: 4.2 10*6/uL (ref 3.70–5.45)
RDW: 18.1 % — ABNORMAL HIGH (ref 11.2–14.5)
WBC: 4.1 10*3/uL (ref 3.9–10.3)
lymph#: 0.9 10*3/uL (ref 0.9–3.3)

## 2013-11-30 MED ORDER — HEPARIN SOD (PORK) LOCK FLUSH 100 UNIT/ML IV SOLN
500.0000 [IU] | Freq: Once | INTRAVENOUS | Status: AC | PRN
  Administered 2013-11-30: 500 [IU]
  Filled 2013-11-30: qty 5

## 2013-11-30 MED ORDER — SODIUM CHLORIDE 0.9 % IJ SOLN
10.0000 mL | INTRAMUSCULAR | Status: DC | PRN
Start: 2013-11-30 — End: 2013-11-30
  Administered 2013-11-30: 10 mL
  Filled 2013-11-30: qty 10

## 2013-11-30 MED ORDER — ACETAMINOPHEN 325 MG PO TABS
650.0000 mg | ORAL_TABLET | Freq: Once | ORAL | Status: AC
  Administered 2013-11-30: 650 mg via ORAL

## 2013-11-30 MED ORDER — SODIUM CHLORIDE 0.9 % IV SOLN
Freq: Once | INTRAVENOUS | Status: AC
  Administered 2013-11-30: 11:00:00 via INTRAVENOUS

## 2013-11-30 MED ORDER — ACETAMINOPHEN 325 MG PO TABS
ORAL_TABLET | ORAL | Status: AC
  Filled 2013-11-30: qty 2

## 2013-11-30 MED ORDER — DIPHENHYDRAMINE HCL 25 MG PO CAPS
25.0000 mg | ORAL_CAPSULE | Freq: Once | ORAL | Status: AC
  Administered 2013-11-30: 25 mg via ORAL

## 2013-11-30 MED ORDER — TRASTUZUMAB CHEMO INJECTION 440 MG
6.0000 mg/kg | Freq: Once | INTRAVENOUS | Status: AC
  Administered 2013-11-30: 672 mg via INTRAVENOUS
  Filled 2013-11-30: qty 32

## 2013-11-30 MED ORDER — DIPHENHYDRAMINE HCL 25 MG PO CAPS
ORAL_CAPSULE | ORAL | Status: AC
  Filled 2013-11-30: qty 1

## 2013-11-30 NOTE — Patient Instructions (Signed)

## 2013-11-30 NOTE — Progress Notes (Signed)
[place]  [phone] [phone]) [phone]    ID: [name] OB: Jun 22, 1955  MR#: [id number]  CLE#:[id number]  PCP: Lacey Battiest, MD GYN:   SU: Dr. Erroll Jackson OTHER MD: Dr. Thea Jackson  CHIEF COMPLAINT:  Locally advanced breast cancer TREATMENT: Receiving anti-HER-2 immunotherapy;   BREAST CANCER HISTORY: From Dr [name] original intake note:  The patient herself noted a change in her right breast November 2014, but did not tell her family until after Christmas at the year. They "pretty much forced me" to have a mammogram and bilateral diagnostic mammography and right ultrasound 01/19/2013 at the breast Center showed an irregular mass associated with pleomorphic calcifications in the upper-outer quadrant of the right breast measuring 1.6 cm. There was an abnormal appearing right axillary lymph node. On physical exam, there was a movable firm palpable mass in the right breast measuring approximately 2 cm by palpation. Ultrasound showed this to be an irregularly marginated hypoechoic mass measuring 1.8 cm. In the right axilla the ultrasound showed a 1.3 cm abnormal appearing level I lymph node (loss a fatty hilum).  The 01/26/2013 the patient underwent biopsy of both the right breast mass in question and a suspicious right axillary lymph node. This showed ([id number]) both the breast mass and axillary lymph node 2 be positive for invasive ductal carcinoma, grade 2 or 3, estrogen receptor 99% positive, progesterone receptor 95% positive, both with strong staining intensity, with an MIB-1 of 70%, and HER-2 amplified at 3+.The patient was unable to undergo MRI because of claustrophobia concerns.   Her subsequent history is as detailed below.  INTERVAL HISTORY: [name] returns to clinic today for follow up of her breast cancer, accompanied by her daughter [name]. She is due for trastuzumab today. Her last dose was held 3 weeks ago because he was admitted  for atrial flutter/narrow complex tachycardio and required cardioversion. The echo performed while admitted was normal. These symptoms have now resolved and she is on metoprolol and eliquis. She states that she was taken off of HCTZ and clonidine TTS and her blood pressure hasn't been right since. Her manual blood pressure today is 144/72. She endorses mild headaches possibly related to elevated blood pressures. The interval history is remarkable for completing radiation yesterday.   REVIEW OF SYSTEMS: [name] denies fevers, chills, nausea, vomiting, or changes in bowel or bladder habits. Appetite is strong. The numbness to her fingertips continues to improve and she is now back to crocheting, a hobby she enjoys immensely. She has some shortness of breath with exertion, and uses a rolling walker for steadiness secondary to left knee arthritis. She takes OTC meds for this pain alone. She has no chest pain, palpitations, cough, or fatigue. A detailed review of systems is otherwise noncontributory.  PAST MEDICAL HISTORY: Past Medical History  Diagnosis Date  . Hyperlipidemia   . Obesity   . Asthma     prn neb. and inhaler  . Osteoarthritis 2003    left knee  . Non-insulin dependent type 2 diabetes mellitus   . Graves' disease   . Ophthalmic manifestation of Graves disease   . Hypertension     on multiple meds., has been on med. > 14 yr.  . Dehydration 04/13/2013  . Breast cancer 01/2013    right  . Wears glasses   . GERD (gastroesophageal reflux disease)     PAST SURGICAL HISTORY: Past Surgical History  Procedure Laterality Date  . Total knee arthroplasty Left 2003  . Total  thyroidectomy    . Colonoscopy  2007  . Tubal ligation  1985  . Colonoscopy w/ polypectomy  2009  . Portacath placement Right 02/17/2013    Procedure: INSERTION PORT-A-CATH;  Surgeon: Lacey Faster. Cornett, MD;  Location: [facility];  Service: General;  Laterality: Right;  . Tee without cardioversion N/A  11/08/2013    Procedure: TRANSESOPHAGEAL ECHOCARDIOGRAM (TEE);  Surgeon: Lacey Perla, MD;  Location: [facility] ENDOSCOPY;  Service: Cardiovascular;  Laterality: N/A;  . Cardioversion N/A 11/08/2013    Procedure: CARDIOVERSION;  Surgeon: Lacey Perla, MD;  Location: [facility] ENDOSCOPY;  Service: Cardiovascular;  Laterality: N/A;    FAMILY HISTORY Family History  Problem Relation Age of Onset  . Heart disease Father   . Lung cancer Father   . Diabetes Mother   . Diabetes Sister   . Hypertension Sister   . Asthma Sister    the patient's father died from lung cancer at the age of 24. The patient's mother died from thyroid cancer at the age of 70. The patient had 2 brothers, 2 sisters. There is no other history of breast or ovarian cancer in the family to her knowledge  GYN HISTORY: Menarche age 44, first live birth age 52. The patient is GX P2. She went through the change of life approximately age 47. She did not take hormone replacement.  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 She is now disabled secondary to her knee problems. She is divorced. At home she lives with her 2 daughters, [name] who works in direct supports to mentally retarded children, and [name], who is a group Games developer for mentally retarded children. The patient has no grandchildren. She attends a [employer]  ADVANCED DIRECTIVES: Not in place; these have been discussed with the patient, most recently in the 07/28/2013 visit.  HEALTH MAINTENANCE: History  Substance Use Topics  . Smoking status: Former Smoker -- 0.25 packs/day for 20 years    Quit date: 02/10/1991  . Smokeless tobacco: Never Used  . Alcohol Use: No      Allergies  Allergen Reactions  . Procardia [Nifedipine] Other (See Comments)    MIGRAINES  . Diltiazem Itching    Current Outpatient Prescriptions  Medication Sig Dispense Refill  . acetaminophen (TYLENOL) 325 MG tablet  Take 2 tablets (650 mg total) by mouth every 6 (six) hours as needed for mild pain or headache.    . albuterol (PROVENTIL HFA;VENTOLIN HFA) 108 (90 BASE) MCG/ACT inhaler Inhale 2 puffs into the lungs every 4 (four) hours as needed. 18 g 2  . amLODipine (NORVASC) 10 MG tablet Take 10 mg by mouth daily.    Marland Kitchen apixaban (ELIQUIS) 5 MG TABS tablet Take 1 tablet (5 mg total) by mouth 2 (two) times daily. 60 tablet 1  . benazepril (LOTENSIN) 40 MG tablet Take 40 mg by mouth daily.     . cholecalciferol (VITAMIN D) 1000 UNITS tablet Take 1,000 Units by mouth daily.    Marland Kitchen gabapentin (NEURONTIN) 100 MG capsule Take 1 capsule (100 mg total) by mouth 3 (three) times daily. 90 capsule 5  . levothyroxine (SYNTHROID, LEVOTHROID) 100 MCG tablet     . metFORMIN (GLUCOPHAGE) 500 MG tablet Take 500 mg by mouth 2 (two) times daily with a meal.    . metoprolol tartrate (LOPRESSOR) 25 MG tablet Take 3 tablets (75 mg total) by mouth 2 (two) times daily. 60 tablet 0  . pravastatin (  PRAVACHOL) 80 MG tablet Take 40 mg by mouth at bedtime.    . vitamin B-12 (CYANOCOBALAMIN) 500 MCG tablet Take 500 mcg by mouth daily.    . Artificial Tear Ointment (ARTIFICIAL TEARS) ointment Place 1 drop into both eyes as needed (for grey eye disease).     . calcium carbonate (TUMS EX) 750 MG chewable tablet Chew 2 tablets by mouth 4 (four) times daily.    Marland Kitchen guaifenesin (ROBITUSSIN) 100 MG/5ML syrup Take 10 mLs (200 mg total) by mouth every 4 (four) hours as needed for congestion. 120 mL 0   No current facility-administered medications for this visit.    OBJECTIVE: Middle-aged [place] American woman in no acute distress   Filed Vitals:   11/30/13 0955  BP: 170/90  Pulse: 64  Temp: 98.7 F (37.1 C)  Resp: 18     Body mass index is 43.85 kg/(m^2).      ECOG: 1  Skin: warm, dry  HEENT: sclerae anicteric, conjunctivae pink, oropharynx clear. No thrush or mucositis.  Lymph Nodes: No cervical or supraclavicular lymphadenopathy   Lungs: clear to auscultation bilaterally, no rales, wheezes, or rhonci  Heart: regular rate and rhythm  Abdomen: round, soft, non tender, positive bowel sounds  Musculoskeletal: No focal spinal tenderness, no peripheral edema  Neuro: non focal, well oriented, positive affect  Breast: right breast status post lumpectomy and radiation. Right breast hyperpigmented, but skin intact with no open lesions. Right axilla benign. Left breast unremarkable.   LAB RESULTS:  CMP     Component Value Date/Time   NA 145 11/30/2013 0914   NA 144 11/09/2013 0515   K 4.3 11/30/2013 0914   K 4.3 11/09/2013 0515   CL 107 11/09/2013 0515   CO2 26 11/30/2013 0914   CO2 25 11/09/2013 0515   GLUCOSE 181* 11/30/2013 0914   GLUCOSE 115* 11/09/2013 0515   BUN 16.2 11/30/2013 0914   BUN 21 11/09/2013 0515   CREATININE 1.2* 11/30/2013 0914   CREATININE 1.30* 11/09/2013 0515   CREATININE 1.28* 05/06/2013 1149   CALCIUM 9.2 11/30/2013 0914   CALCIUM 8.5 11/09/2013 0515   CALCIUM 6.4* 05/08/2013 1215   PROT 7.2 11/30/2013 0914   PROT 6.7 11/04/2013 0513   ALBUMIN 3.9 11/30/2013 0914   ALBUMIN 3.4* 11/04/2013 0513   AST 19 11/30/2013 0914   AST 16 11/04/2013 0513   ALT 13 11/30/2013 0914   ALT 12 11/04/2013 0513   ALKPHOS 90 11/30/2013 0914   ALKPHOS 85 11/04/2013 0513   BILITOT 0.37 11/30/2013 0914   BILITOT 0.3 11/04/2013 0513   GFRNONAA 44* 11/09/2013 0515   GFRAA 51* 11/09/2013 0515    I No results found for: SPEP  Lab Results  Component Value Date   WBC 4.1 11/30/2013   NEUTROABS 2.5 11/30/2013   HGB 11.5* 11/30/2013   HCT 35.7 11/30/2013   MCV 85.1 11/30/2013   PLT 229 11/30/2013      Chemistry      Component Value Date/Time   NA 145 11/30/2013 0914   NA 144 11/09/2013 0515   K 4.3 11/30/2013 0914   K 4.3 11/09/2013 0515   CL 107 11/09/2013 0515   CO2 26 11/30/2013 0914   CO2 25 11/09/2013 0515   BUN 16.2 11/30/2013 0914   BUN 21 11/09/2013 0515   CREATININE 1.2* 11/30/2013  0914   CREATININE 1.30* 11/09/2013 0515   CREATININE 1.28* 05/06/2013 1149      Component Value Date/Time   CALCIUM 9.2 11/30/2013 0914  CALCIUM 8.5 11/09/2013 0515   CALCIUM 6.4* 05/08/2013 1215   ALKPHOS 90 11/30/2013 0914   ALKPHOS 85 11/04/2013 0513   AST 19 11/30/2013 0914   AST 16 11/04/2013 0513   ALT 13 11/30/2013 0914   ALT 12 11/04/2013 0513   BILITOT 0.37 11/30/2013 0914   BILITOT 0.3 11/04/2013 0513       No results found for: LABCA2  No components found for: [id number]  No results for input(s): INR in the last 168 hours.  Urinalysis    Component Value Date/Time   COLORURINE YELLOW 09/06/2013 1040   APPEARANCEUR CLEAR 09/06/2013 1040   LABSPEC 1.015 09/06/2013 1040   PHURINE 5.0 09/06/2013 1040   GLUCOSEU NEGATIVE 09/06/2013 1040   HGBUR NEGATIVE 09/06/2013 1040   BILIRUBINUR NEGATIVE 09/06/2013 1040   KETONESUR NEGATIVE 09/06/2013 1040   PROTEINUR NEGATIVE 09/06/2013 1040   UROBILINOGEN 0.2 09/06/2013 1040   NITRITE NEGATIVE 09/06/2013 1040   LEUKOCYTESUR NEGATIVE 09/06/2013 1040    STUDIES: Most recent echocardiogram on 11/07/13 shows an ejection fraction of 55-60%  ASSESSMENT: 58 y.o. [place] woman   (1) status post right breast and right axillary lymph node biopsy 01/26/2013,  for a clinical T1c N1, stage IIA invasive ductal carcinoma, grade 3, estrogen and progesterone receptor positive, HER-2 amplified by immunohistochemistry (3+), with an MIB-1 of 70%.  (2) treated neo-adjuvantly with 4 of 6 planned cycles of docetaxel, carboplatin, trastuzumab,and pertuzumab, last dose 04/27/2013   (a) final 2 cycles of chemotherapy omitted in the face of multiple renal and electrolyte abnormalities    (b) pertuzumab and trastuzumab to be continued until definitive surgery  (c) trastuzumab alone being continued to complete one year of anti-HER-2 treatment  (d) echocardiograms every 3 months while on trastuzumab; most recent study 07/11/2013  (3) status  post right lumpectomy and sentinel lymph node sampling 07/19/2013 showing a complete pathologic response (ypT0 ypN0)  (4) adjuvant radiation to follow surgery-- the patient is considering the B-51 study  (5) antiestrogen therapy to follow radiation  PLAN: [name] is doing well today. The labs were reviewed in detail and were entirely stable. She will proceed with trastuzumab today.   She has a follow up appointment with Dr. Haroldine Laws approaching this Monday. They will address her cardiac concerns including her blood pressure at this time. Whether or not an echo will be performed is questionable. Her most recent echo was just on 11/07/13 and showed a well preserved ejection fraction. She will be due for her next echocardiogram in late January.   [name] will return in 3 weeks for labs, an office visit, and her next dose of trastuzumab. At this time we will discuss beginning anti-estrogen therapy. I have given her a handout outlining the differences in menopausal, tamoxifen, and aromatase inhibitor side effects for reading beforehand. Sindy understands and agrees with this plan. She knows the goal of treatment is cure. She has been encouraged to call with any issues that might arise before her next visit here.   11/30/2013 10:30 AM Marcelino Duster, NP

## 2013-11-30 NOTE — Telephone Encounter (Signed)
, °

## 2013-11-30 NOTE — Telephone Encounter (Signed)
per pof to sch pt appt-sent MW email to add trmts-adv MW to move MD appt on 12/9 to coordinate MD appt-pt in trmt room will get updated copy b4 leaving

## 2013-12-02 ENCOUNTER — Encounter: Payer: Self-pay | Admitting: Family Medicine

## 2013-12-02 ENCOUNTER — Ambulatory Visit (INDEPENDENT_AMBULATORY_CARE_PROVIDER_SITE_OTHER): Payer: Medicare Other | Admitting: Family Medicine

## 2013-12-02 VITALS — BP 122/78 | Ht 64.5 in | Wt 261.0 lb

## 2013-12-02 DIAGNOSIS — I4892 Unspecified atrial flutter: Secondary | ICD-10-CM

## 2013-12-02 DIAGNOSIS — I10 Essential (primary) hypertension: Secondary | ICD-10-CM

## 2013-12-02 NOTE — Progress Notes (Signed)
   Subjective:    Patient ID: [name], female    DOB: September 26, 1955, 58 y.o.   MRN: [id number]  HPI Patient is here today for a f/u ED on 10/22. She went for a-fib. Went to [facility].was in the hosp for five d  Sugars are running good  Crazy racing of the heart inmproved    Pt states she is getting over asthma, and starting to feel better. She had missed 2 tx of chemo.  No new concerns.   She has a f/u appt with cardiologist on Monday.   Review of Systems No headache no chest pain no back pain no abdominal pain no change in bowel habits no blood in stool ROS otherwise negative    Objective:   Physical Exam  Alert no acute distress vital stable lungs clear heart regular rhythm H&T normal ankles without edema heart rhythm is slow appears to be in good rhythm.      Assessment & Plan:  Impression status post atrial fibrillation stable #2 asthma stable #3 hypertension decent control plan maintain same medications. Of note new choice of medicine seems to be helping blood pressure better discussed with patient all of her regular scheduled appointment. WSL #4 diabetes good control fasting sugars good. 25 minutes spent most in discussion

## 2013-12-05 ENCOUNTER — Ambulatory Visit (HOSPITAL_COMMUNITY)
Admission: RE | Admit: 2013-12-05 | Discharge: 2013-12-05 | Disposition: A | Discharge status: Home / Self Care | Payer: Medicare Other | Source: Ambulatory Visit | Attending: Internal Medicine | Admitting: Internal Medicine

## 2013-12-05 ENCOUNTER — Encounter (HOSPITAL_COMMUNITY): Payer: Self-pay

## 2013-12-05 ENCOUNTER — Other Ambulatory Visit: Payer: Self-pay | Admitting: Family Medicine

## 2013-12-05 VITALS — BP 168/82 | HR 70 | Wt 257.8 lb

## 2013-12-05 DIAGNOSIS — E0590 Thyrotoxicosis, unspecified without thyrotoxic crisis or storm: Secondary | ICD-10-CM | POA: Diagnosis not present

## 2013-12-05 DIAGNOSIS — I4892 Unspecified atrial flutter: Secondary | ICD-10-CM | POA: Diagnosis not present

## 2013-12-05 DIAGNOSIS — R079 Chest pain, unspecified: Secondary | ICD-10-CM

## 2013-12-05 DIAGNOSIS — I340 Nonrheumatic mitral (valve) insufficiency: Secondary | ICD-10-CM | POA: Diagnosis not present

## 2013-12-05 DIAGNOSIS — I10 Essential (primary) hypertension: Secondary | ICD-10-CM | POA: Insufficient documentation

## 2013-12-05 DIAGNOSIS — C50411 Malignant neoplasm of upper-outer quadrant of right female breast: Secondary | ICD-10-CM

## 2013-12-05 DIAGNOSIS — C50911 Malignant neoplasm of unspecified site of right female breast: Secondary | ICD-10-CM | POA: Diagnosis present

## 2013-12-05 MED ORDER — CARVEDILOL 12.5 MG PO TABS: 12.5000 mg | ORAL_TABLET | Freq: Two times a day (BID) | ORAL | Status: DC

## 2013-12-05 NOTE — Progress Notes (Signed)
Patient ID: MAYLENE CROCKER, female   DOB: [birth year], 58 y.o.   MRN: [id number]   PCP: [facility] Cardiologist: previously Dr. Lattie Haw Oncologist: Dr. Humphrey Rolls Patient ID: [name], female   DOB: 24-Sep-1955, 58 y.o.   MRN: [id number]    HPI:  DAREEN GUTZWILLER is a 58 y.o. female. 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 The biopsied right lymph node was positive for metastatic disease. She is followed in the Cardio-Oncology clinic for monitoring during Herceptin therapy.   Medical hx notable for morbid obesity, DM2, Graves disease, severe HTN, tobacco abuse (q 1990). Had cardiac cath in 1995 which showed normal coronaries. Lexiscan 2/14: Normal  Completed 6 cycles  Taxotere carboplatinum Herceptin/perjeta (started 02/23/13)  She continues Herceptin/Perjeta every 3 weeks. And will continue for 1 year - finish March 2016.   She return for post-hospital f/u. 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 Echo with normal EF (mild to moderate MR). Underwent DC-CV. Started on apixaban. Feels ok but weak. No further CP or palpitations. No edema. BP was 132/80 last week after taking medications. Says she does not snore a lot any more. Denies daytime somnolence.   ECHO 02/21/13: LVEF 60-65% Grade 1 DD.  lateral s' 10.3 cm/s. GLS -18.4 ECHO 6//29/15 EF 60-65% lat s' 10.9 GLS 20.3  ECHO 9/15: EF 60-65% lat s' not measured GLS -21%   Past Medical History  Diagnosis Date  . Hyperlipidemia   . Obesity   . Asthma     prn neb. and inhaler  . Osteoarthritis 2003    left knee  . Non-insulin dependent type 2 diabetes mellitus   . Graves' disease   . Ophthalmic manifestation of Graves disease   . Hypertension     on multiple meds., has been on med. > 14 yr.  . Dehydration 04/13/2013  . Breast cancer 01/2013    right  . Wears glasses   . GERD (gastroesophageal reflux disease)     Current  Outpatient Prescriptions  Medication Sig Dispense Refill  . acetaminophen (TYLENOL) 325 MG tablet Take 2 tablets (650 mg total) by mouth every 6 (six) hours as needed for mild pain or headache.    . albuterol (PROVENTIL HFA;VENTOLIN HFA) 108 (90 BASE) MCG/ACT inhaler Inhale 2 puffs into the lungs every 4 (four) hours as needed. 18 g 2  . amLODipine (NORVASC) 10 MG tablet Take 10 mg by mouth daily.    Marland Kitchen apixaban (ELIQUIS) 5 MG TABS tablet Take 1 tablet (5 mg total) by mouth 2 (two) times daily. 60 tablet 1  . Artificial Tear Ointment (ARTIFICIAL TEARS) ointment Place 1 drop into both eyes as needed (for grey eye disease).     . benazepril (LOTENSIN) 40 MG tablet Take 40 mg by mouth daily.     . calcium carbonate (TUMS EX) 750 MG chewable tablet Chew 2 tablets by mouth 4 (four) times daily.    . cholecalciferol (VITAMIN D) 1000 UNITS tablet Take 1,000 Units by mouth daily.    Marland Kitchen gabapentin (NEURONTIN) 100 MG capsule Take 1 capsule (100 mg total) by mouth 3 (three) times daily. 90 capsule 5  . levothyroxine (SYNTHROID, LEVOTHROID) 100 MCG tablet     . metFORMIN (GLUCOPHAGE) 500 MG tablet Take 500 mg by mouth 2 (two) times daily with a meal.    . metoprolol tartrate (LOPRESSOR) 25 MG tablet Take 3 tablets (75 mg total) by mouth  2 (two) times daily. 60 tablet 0  . pravastatin (PRAVACHOL) 80 MG tablet Take 40 mg by mouth at bedtime.    . vitamin B-12 (CYANOCOBALAMIN) 500 MCG tablet Take 500 mcg by mouth daily.     No current facility-administered medications for this encounter.    Allergies  Allergen Reactions  . Procardia [Nifedipine] Other (See Comments)    MIGRAINES  . Diltiazem Itching    History   Social History  . Marital Status: Divorced    Spouse Name: N/A    Number of Children: 2  . Years of Education: N/A   Occupational History  . Disability    Social History Main Topics  . Smoking status: Former Smoker -- 0.25 packs/day for 20 years    Quit date: 02/10/1991  . Smokeless  tobacco: Never Used  . Alcohol Use: No  . Drug Use: No  . Sexual Activity: No   Other Topics Concern  . Not on file   Social History Narrative   Lives in [place]          Family History  Problem Relation Age of Onset  . Heart disease Father   . Lung cancer Father   . Diabetes Mother   . Diabetes Sister   . Hypertension Sister   . Asthma Sister     PHYSICAL EXAM: Filed Vitals:   12/05/13 1042  BP: 168/82  Pulse: 70  Weight: 257 lb 12.8 oz (116.937 kg)  SpO2: 97%    General:  Well appearing. No respiratory difficulty Daughter present  HEENT: normal x for exophthalmosis  Neck: supple. no JVD. Carotids 2+ bilat; no bruits. No lymphadenopathy or thryomegaly appreciated. Cor: PMI nondisplaced. Regular rate & rhythm. No rubs, gallops or murmurs. Keloid noted central aspect of her chest.  Lungs: clear Abdomen: obese soft, nontender, nondistended. No hepatosplenomegaly. No bruits or masses. Good bowel sounds. Extremities: no cyanosis, clubbing, rash, tr edema Neuro: alert & oriented x 3, cranial nerves grossly intact. moves all 4 extremities w/o difficulty. Affect pleasant.   ASSESSMENT & PLAN: 1. R breast cancer   --I reviewed echos personally. EF and Doppler parameters stable. No HF on exam. Continue Herceptin.  2. HTN   --not completely controlled. Will switch lopressor to carvedilol 12.5 bid. Can titrate as needed 3. 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 Continue Eliquis. If recurs can consider ablation 4. 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 Hyperthyroidism - followedd at [facility]    --TSH ok 10/15 6. ?OSA    --with AFL, previous snoring and difficult to control HTN I am concerned about OSA. Will place [place].   Glori Bickers MD  10:59 AM

## 2013-12-05 NOTE — Patient Instructions (Signed)
STOP Lopressor START Carvedilol 12.5mg  one tab twice a day  Your physician recommends that you schedule a follow-up appointment in: 2 months with Echocardiogram  Your physician has requested that you have an echocardiogram. Echocardiography is a painless test that uses sound waves to create images of your heart. It provides your doctor with information about the size and shape of your heart and how well your heart's chambers and valves are working. This procedure takes approximately one hour. There are no restrictions for this procedure.    You will be contacted to arrange the overnight oximetry.

## 2013-12-09 ENCOUNTER — Other Ambulatory Visit: Payer: Self-pay | Admitting: Family Medicine

## 2013-12-21 ENCOUNTER — Other Ambulatory Visit: Payer: Self-pay | Admitting: Oncology

## 2013-12-21 ENCOUNTER — Other Ambulatory Visit (HOSPITAL_BASED_OUTPATIENT_CLINIC_OR_DEPARTMENT_OTHER): Payer: Medicare Other

## 2013-12-21 ENCOUNTER — Ambulatory Visit (HOSPITAL_BASED_OUTPATIENT_CLINIC_OR_DEPARTMENT_OTHER): Payer: Medicare Other | Admitting: Nurse Practitioner

## 2013-12-21 ENCOUNTER — Ambulatory Visit (HOSPITAL_BASED_OUTPATIENT_CLINIC_OR_DEPARTMENT_OTHER): Payer: Medicare Other

## 2013-12-21 ENCOUNTER — Encounter: Payer: Self-pay | Admitting: Nurse Practitioner

## 2013-12-21 VITALS — BP 140/77 | HR 72 | Temp 98.7°F | Resp 20 | Ht 64.0 in | Wt 263.2 lb

## 2013-12-21 DIAGNOSIS — C50411 Malignant neoplasm of upper-outer quadrant of right female breast: Secondary | ICD-10-CM

## 2013-12-21 DIAGNOSIS — Z5112 Encounter for antineoplastic immunotherapy: Secondary | ICD-10-CM

## 2013-12-21 DIAGNOSIS — Z170 Estrogen receptor positive status [ER+]: Secondary | ICD-10-CM

## 2013-12-21 DIAGNOSIS — C773 Secondary and unspecified malignant neoplasm of axilla and upper limb lymph nodes: Secondary | ICD-10-CM

## 2013-12-21 LAB — COMPREHENSIVE METABOLIC PANEL (CC13)
ALBUMIN: 3.7 g/dL (ref 3.5–5.0)
ALK PHOS: 93 U/L (ref 40–150)
ALT: 17 U/L (ref 0–55)
AST: 22 U/L (ref 5–34)
Anion Gap: 11 mEq/L (ref 3–11)
BUN: 13.2 mg/dL (ref 7.0–26.0)
CO2: 27 mEq/L (ref 22–29)
Calcium: 9.1 mg/dL (ref 8.4–10.4)
Chloride: 105 mEq/L (ref 98–109)
Creatinine: 1.1 mg/dL (ref 0.6–1.1)
EGFR: 67 mL/min/{1.73_m2} — ABNORMAL LOW (ref >90)
Glucose: 183 mg/dl — ABNORMAL HIGH (ref 70–140)
POTASSIUM: 3.8 meq/L (ref 3.5–5.1)
SODIUM: 143 meq/L (ref 136–145)
Total Bilirubin: 0.39 mg/dL (ref 0.20–1.20)
Total Protein: 7 g/dL (ref 6.4–8.3)

## 2013-12-21 LAB — CBC WITH DIFFERENTIAL/PLATELET
BASO%: 0.3 % (ref 0.0–2.0)
BASOS ABS: 0 10*3/uL (ref 0.0–0.1)
EOS ABS: 0.1 10*3/uL (ref 0.0–0.5)
EOS%: 2.6 % (ref 0.0–7.0)
HEMATOCRIT: 34.5 % — AB (ref 34.8–46.6)
HEMOGLOBIN: 11.3 g/dL — AB (ref 11.6–15.9)
LYMPH#: 0.9 10*3/uL (ref 0.9–3.3)
LYMPH%: 25.7 % (ref 14.0–49.7)
MCH: 27.5 pg (ref 25.1–34.0)
MCHC: 32.8 g/dL (ref 31.5–36.0)
MCV: 83.9 fL (ref 79.5–101.0)
MONO#: 0.2 10*3/uL (ref 0.1–0.9)
MONO%: 6.4 % (ref 0.0–14.0)
NEUT#: 2.3 10*3/uL (ref 1.5–6.5)
NEUT%: 65 % (ref 38.4–76.8)
PLATELETS: 167 10*3/uL (ref 145–400)
RBC: 4.11 10*6/uL (ref 3.70–5.45)
RDW: 16 % — ABNORMAL HIGH (ref 11.2–14.5)
WBC: 3.5 10*3/uL — AB (ref 3.9–10.3)

## 2013-12-21 MED ORDER — ANASTROZOLE 1 MG PO TABS: 1.0000 mg | ORAL_TABLET | Freq: Every day | ORAL | Status: DC

## 2013-12-21 MED ORDER — DIPHENHYDRAMINE HCL 25 MG PO CAPS
25.0000 mg | ORAL_CAPSULE | Freq: Once | ORAL | Status: AC
  Administered 2013-12-21: 25 mg via ORAL

## 2013-12-21 MED ORDER — SODIUM CHLORIDE 0.9 % IV SOLN
Freq: Once | INTRAVENOUS | Status: AC
  Administered 2013-12-21: 12:00:00 via INTRAVENOUS

## 2013-12-21 MED ORDER — SODIUM CHLORIDE 0.9 % IJ SOLN
10.0000 mL | INTRAMUSCULAR | Status: DC | PRN
  Administered 2013-12-21: 10 mL
  Filled 2013-12-21: qty 10

## 2013-12-21 MED ORDER — ACETAMINOPHEN 325 MG PO TABS
ORAL_TABLET | ORAL | Status: AC
Start: 2013-12-21 — End: 2013-12-21
  Filled 2013-12-21: qty 2

## 2013-12-21 MED ORDER — ACETAMINOPHEN 325 MG PO TABS
650.0000 mg | ORAL_TABLET | Freq: Once | ORAL | Status: AC
  Administered 2013-12-21: 650 mg via ORAL

## 2013-12-21 MED ORDER — DIPHENHYDRAMINE HCL 25 MG PO CAPS
ORAL_CAPSULE | ORAL | Status: AC
  Filled 2013-12-21: qty 1

## 2013-12-21 MED ORDER — TRASTUZUMAB CHEMO INJECTION 440 MG
6.0000 mg/kg | Freq: Once | INTRAVENOUS | Status: AC
  Administered 2013-12-21: 714 mg via INTRAVENOUS
  Filled 2013-12-21: qty 34

## 2013-12-21 MED ORDER — HEPARIN SOD (PORK) LOCK FLUSH 100 UNIT/ML IV SOLN
500.0000 [IU] | Freq: Once | INTRAVENOUS | Status: AC | PRN
  Administered 2013-12-21: 500 [IU]
  Filled 2013-12-21: qty 5

## 2013-12-21 NOTE — Patient Instructions (Signed)
[facility] Discharge Instructions for Patients Receiving Chemotherapy  Today you received the following chemotherapy agents:  Herceptin  To help prevent nausea and vomiting after your treatment, we encourage you to take your nausea medication as ordered per MD.   If you develop nausea and vomiting that is not controlled by your nausea medication, call the clinic.   BELOW ARE SYMPTOMS THAT SHOULD BE REPORTED IMMEDIATELY:  *FEVER GREATER THAN 100.5 F  *CHILLS WITH OR WITHOUT FEVER  NAUSEA AND VOMITING THAT IS NOT CONTROLLED WITH YOUR NAUSEA MEDICATION  *UNUSUAL SHORTNESS OF BREATH  *UNUSUAL BRUISING OR BLEEDING  TENDERNESS IN MOUTH AND THROAT WITH OR WITHOUT PRESENCE OF ULCERS  *URINARY PROBLEMS  *BOWEL PROBLEMS  UNUSUAL RASH Items with * indicate a potential emergency and should be followed up as soon as possible.  Feel free to call the clinic you have any questions or concerns. The clinic phone number is [phone].    

## 2013-12-21 NOTE — Progress Notes (Addendum)
[name]  [phone] [phone]    ID: [name] OB: 1955-08-30  MR#: [id number]  HAL#:[id number]  PCP: Rubbie Battiest, MD GYN:   SU: Dr. Erroll Luna OTHER MD: Dr. Thea Silversmith  CHIEF COMPLAINT:  Locally advanced breast cancer TREATMENT: Receiving anti-HER-2 immunotherapy; to begin anti-estrogen therapy  BREAST CANCER HISTORY: From Dr Dana [name] original intake note:  The patient herself noted a change in her right breast November 2014, but did not tell her family until after Christmas at the year. They "pretty much forced me" to have a mammogram and bilateral diagnostic mammography and right ultrasound 01/19/2013 at the breast Center showed an irregular mass associated with pleomorphic calcifications in the upper-outer quadrant of the right breast measuring 1.6 cm. There was an abnormal appearing right axillary lymph node. On physical exam, there was a movable firm palpable mass in the right breast measuring approximately 2 cm by palpation. Ultrasound showed this to be an irregularly marginated hypoechoic mass measuring 1.8 cm. In the right axilla the ultrasound showed a 1.3 cm abnormal appearing level I lymph node (loss a fatty hilum).  The 01/26/2013 the patient underwent biopsy of both the right breast mass in question and a suspicious right axillary lymph node. This showed ([id number]) both the breast mass and axillary lymph node 2 be positive for invasive ductal carcinoma, grade 2 or 3, estrogen receptor 99% positive, progesterone receptor 95% positive, both with strong staining intensity, with an MIB-1 of 70%, and HER-2 amplified at 3+.The patient was unable to undergo MRI because of claustrophobia concerns.   Her subsequent history is as detailed below.  INTERVAL HISTORY: [name] returns to clinic today for follow up of her breast cancer, accompanied by her daughter [name]. She is due for trastuzumab today. The interval history is  remarkable for a sleep study she performed a couple weeks ago, but she has not heard the results of this yet. Otherwise there are no significant events in the interval history.  REVIEW OF SYSTEMS: [name] denies fevers, chills, nausea, vomiting, or changes in bowel or bladder habits. Her appetite is healthy and she is staying well hydrated. The numbness to her fingertips is stable. She has some shortness of breath with exertion, and uses a rolling walker for steadiness secondary to left knee arthritis. She takes OTC meds for this pain alone. She has no chest pain, palpitations, cough. She has some mild fatigue that might be better categorized as "a lack of motivation" to do much sometimes. A detailed review of systems is otherwise negative.  PAST MEDICAL HISTORY: Past Medical History  Diagnosis Date  . Hyperlipidemia   . Obesity   . Asthma     prn neb. and inhaler  . Osteoarthritis 2003    left knee  . Non-insulin dependent type 2 diabetes mellitus   . Graves' disease   . Ophthalmic manifestation of Graves disease   . Hypertension     on multiple meds., has been on med. > 14 yr.  . Dehydration 04/13/2013  . Breast cancer 01/2013    right  . Wears glasses   . GERD (gastroesophageal reflux disease)     PAST SURGICAL HISTORY: Past Surgical History  Procedure Laterality Date  . Total knee arthroplasty Left 2003  . Total thyroidectomy    . Colonoscopy  2007  . Tubal ligation  1985  . Colonoscopy w/ polypectomy  2009  . Portacath placement Right 02/17/2013    Procedure: INSERTION PORT-A-CATH;  Surgeon: Joyice Faster.  Cornett, MD;  Location: [place];  Service: General;  Laterality: Right;  . Tee without cardioversion N/A 11/08/2013    Procedure: TRANSESOPHAGEAL ECHOCARDIOGRAM (TEE);  Surgeon: Lelon Perla, MD;  Location: [facility] ENDOSCOPY;  Service: Cardiovascular;  Laterality: N/A;  . Cardioversion N/A 11/08/2013    Procedure: CARDIOVERSION;  Surgeon: Lelon Perla, MD;   Location: [facility] ENDOSCOPY;  Service: Cardiovascular;  Laterality: N/A;    FAMILY HISTORY Family History  Problem Relation Age of Onset  . Heart disease Father   . Lung cancer Father   . Diabetes Mother   . Diabetes Sister   . Hypertension Sister   . Asthma Sister    the patient's father died from lung cancer at the age of 83. The patient's mother died from thyroid cancer at the age of 89. The patient had 2 brothers, 2 sisters. There is no other history of breast or ovarian cancer in the family to her knowledge  GYN HISTORY: Menarche age 33, first live birth age 39. The patient is GX P2. She went through the change of life approximately age 66. She did not take hormone replacement.  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 She is now disabled secondary to her knee problems. She is divorced. At home she lives with her 2 daughters, [name] who works in direct supports to mentally retarded children, and [name], who is a group Games developer for mentally retarded children. The patient has no grandchildren. She attends a [employer]  ADVANCED DIRECTIVES: Not in place; these have been discussed with the patient, most recently in the 07/28/2013 visit.  HEALTH MAINTENANCE: History  Substance Use Topics  . Smoking status: Former Smoker -- 0.25 packs/day for 20 years    Quit date: 02/10/1991  . Smokeless tobacco: Never Used  . Alcohol Use: No      Allergies  Allergen Reactions  . Procardia [Nifedipine] Other (See Comments)    MIGRAINES  . Diltiazem Itching    Current Outpatient Prescriptions  Medication Sig Dispense Refill  . acetaminophen (TYLENOL) 325 MG tablet Take 2 tablets (650 mg total) by mouth every 6 (six) hours as needed for mild pain or headache.    . albuterol (PROVENTIL HFA;VENTOLIN HFA) 108 (90 BASE) MCG/ACT inhaler Inhale 2 puffs into the lungs every 4 (four) hours as needed. 18 g 2  . amLODipine  (NORVASC) 10 MG tablet Take 10 mg by mouth daily.    Marland Kitchen anastrozole (ARIMIDEX) 1 MG tablet Take 1 tablet (1 mg total) by mouth daily. 30 tablet 2  . apixaban (ELIQUIS) 5 MG TABS tablet Take 1 tablet (5 mg total) by mouth 2 (two) times daily. 60 tablet 1  . Artificial Tear Ointment (ARTIFICIAL TEARS) ointment Place 1 drop into both eyes as needed (for grey eye disease).     . benazepril (LOTENSIN) 40 MG tablet TAKE ONE TABLET BY MOUTH ONCE DAILY. 30 tablet 5  . calcium carbonate (TUMS EX) 750 MG chewable tablet Chew 2 tablets by mouth 4 (four) times daily.    . carvedilol (COREG) 12.5 MG tablet Take 1 tablet (12.5 mg total) by mouth 2 (two) times daily. 180 tablet 3  . cholecalciferol (VITAMIN D) 1000 UNITS tablet Take 1,000 Units by mouth daily.    Marland Kitchen gabapentin (NEURONTIN) 100 MG capsule Take 1 capsule (100 mg total) by mouth 3 (three) times daily. 90 capsule 5  . levothyroxine (SYNTHROID, LEVOTHROID) 100 MCG tablet     .  metFORMIN (GLUCOPHAGE) 500 MG tablet TAKE (1) TABLET BY MOUTH TWICE A DAY WITH MEALS (BREAKFAST AND SUPPER). 60 tablet 5  . pravastatin (PRAVACHOL) 80 MG tablet Take 40 mg by mouth at bedtime.    . vitamin B-12 (CYANOCOBALAMIN) 500 MCG tablet Take 500 mcg by mouth daily.     No current facility-administered medications for this visit.   Facility-Administered Medications Ordered in Other Visits  Medication Dose Route Frequency Provider Last Rate Last Dose  . 0.9 %  sodium chloride infusion   Intravenous Once Chauncey Cruel, MD      . acetaminophen (TYLENOL) tablet 650 mg  650 mg Oral Once Chauncey Cruel, MD      . diphenhydrAMINE (BENADRYL) capsule 25 mg  25 mg Oral Once Chauncey Cruel, MD      . heparin lock flush 100 unit/mL  500 Units Intracatheter Once PRN Chauncey Cruel, MD      . sodium chloride 0.9 % injection 10 mL  10 mL Intracatheter PRN Chauncey Cruel, MD      . trastuzumab (HERCEPTIN) 714 mg in sodium chloride 0.9 % 250 mL chemo infusion  6 mg/kg  (Treatment Plan Actual) Intravenous Once Chauncey Cruel, MD        OBJECTIVE: Middle-aged African American woman in no acute distress   Filed Vitals:   12/21/13 1028  BP: 140/77  Pulse: 72  Temp: 98.7 F (37.1 C)  Resp: 20     Body mass index is 45.16 kg/(m^2).      ECOG: 1  Sclerae unicteric, pupils equal and reactive Oropharynx clear and moist-- no thrush No cervical or supraclavicular adenopathy Lungs no rales or rhonchi Heart regular rate and rhythm Abd soft, nontender, positive bowel sounds MSK no focal spinal tenderness, no upper extremity lymphedema Neuro: nonfocal, well oriented, appropriate affect Breasts: deferred  LAB RESULTS:  CMP     Component Value Date/Time   NA 143 12/21/2013 1002   NA 144 11/09/2013 0515   K 3.8 12/21/2013 1002   K 4.3 11/09/2013 0515   CL 107 11/09/2013 0515   CO2 27 12/21/2013 1002   CO2 25 11/09/2013 0515   GLUCOSE 183* 12/21/2013 1002   GLUCOSE 115* 11/09/2013 0515   BUN 13.2 12/21/2013 1002   BUN 21 11/09/2013 0515   CREATININE 1.1 12/21/2013 1002   CREATININE 1.30* 11/09/2013 0515   CREATININE 1.28* 05/06/2013 1149   CALCIUM 9.1 12/21/2013 1002   CALCIUM 8.5 11/09/2013 0515   CALCIUM 6.4* 05/08/2013 1215   PROT 7.0 12/21/2013 1002   PROT 6.7 11/04/2013 0513   ALBUMIN 3.7 12/21/2013 1002   ALBUMIN 3.4* 11/04/2013 0513   AST 22 12/21/2013 1002   AST 16 11/04/2013 0513   ALT 17 12/21/2013 1002   ALT 12 11/04/2013 0513   ALKPHOS 93 12/21/2013 1002   ALKPHOS 85 11/04/2013 0513   BILITOT 0.39 12/21/2013 1002   BILITOT 0.3 11/04/2013 0513   GFRNONAA 44* 11/09/2013 0515   GFRAA 51* 11/09/2013 0515    I No results found for: SPEP  Lab Results  Component Value Date   WBC 3.5* 12/21/2013   NEUTROABS 2.3 12/21/2013   HGB 11.3* 12/21/2013   HCT 34.5* 12/21/2013   MCV 83.9 12/21/2013   PLT 167 12/21/2013      Chemistry      Component Value Date/Time   NA 143 12/21/2013 1002   NA 144 11/09/2013 0515   K  3.8 12/21/2013 1002   K 4.3 11/09/2013  0515   CL 107 11/09/2013 0515   CO2 27 12/21/2013 1002   CO2 25 11/09/2013 0515   BUN 13.2 12/21/2013 1002   BUN 21 11/09/2013 0515   CREATININE 1.1 12/21/2013 1002   CREATININE 1.30* 11/09/2013 0515   CREATININE 1.28* 05/06/2013 1149      Component Value Date/Time   CALCIUM 9.1 12/21/2013 1002   CALCIUM 8.5 11/09/2013 0515   CALCIUM 6.4* 05/08/2013 1215   ALKPHOS 93 12/21/2013 1002   ALKPHOS 85 11/04/2013 0513   AST 22 12/21/2013 1002   AST 16 11/04/2013 0513   ALT 17 12/21/2013 1002   ALT 12 11/04/2013 0513   BILITOT 0.39 12/21/2013 1002   BILITOT 0.3 11/04/2013 0513       No results found for: LABCA2  No components found for: LABCA125  No results for input(s): INR in the last 168 hours.  Urinalysis    Component Value Date/Time   COLORURINE YELLOW 09/06/2013 1040   APPEARANCEUR CLEAR 09/06/2013 1040   LABSPEC 1.015 09/06/2013 1040   PHURINE 5.0 09/06/2013 1040   GLUCOSEU NEGATIVE 09/06/2013 1040   HGBUR NEGATIVE 09/06/2013 1040   BILIRUBINUR NEGATIVE 09/06/2013 1040   KETONESUR NEGATIVE 09/06/2013 1040   PROTEINUR NEGATIVE 09/06/2013 1040   UROBILINOGEN 0.2 09/06/2013 1040   NITRITE NEGATIVE 09/06/2013 1040   LEUKOCYTESUR NEGATIVE 09/06/2013 1040    STUDIES: Most recent echocardiogram on 11/07/13 shows an ejection fraction of 55-60%  ASSESSMENT: 58 y.o. [place] woman   (1) status post right breast and right axillary lymph node biopsy 01/26/2013,  for a clinical T1c N1, stage IIA invasive ductal carcinoma, grade 3, estrogen and progesterone receptor positive, HER-2 amplified by immunohistochemistry (3+), with an MIB-1 of 70%.  (2) treated neo-adjuvantly with 4 of 6 planned cycles of docetaxel, carboplatin, trastuzumab,and pertuzumab, last dose 04/27/2013   (a) final 2 cycles of chemotherapy omitted in the face of multiple renal and electrolyte abnormalities    (b) pertuzumab and trastuzumab to be continued until  definitive Jackson  (c) trastuzumab alone being continued to complete one year of anti-HER-2 treatment  (d) echocardiograms every 3 months while on trastuzumab; most recent study 07/11/2013  (3) status post right lumpectomy and sentinel lymph node sampling 07/19/2013 showing a complete pathologic response (ypT0 ypN0)  (4) adjuvant radiation completed on 11/29/13  (5) to begin anastrozole daily on 12/22/13  PLAN: Zariel looks and feels well today. The labs were reviewed in detail and were stable. We will proceed with trastuzumab today.   Dr. Jana Hakim was brought in on the visit to introduce anti-estrogen therapy. The decision was made to begin [name] on anastrozole daily. He reviewed the common side effects of this drug including: hot flashes, vaginal dryness, bone density loss, and arthralgias/myalgias. This drug was e-prescribed to her pharmacy during this visit.   I have written orders for a baseline bone density scan to be obtained this month. Her next echocardiogram is due in January. She will continue to be treated with trastuzuamb every 3 weeks. She will return to clinic in February for a follow up visit. She understands and agrees with this plan. She knows the goal of treatment in her case is cure. She has been encouraged to call with any issues that might arise before her next visit here.  12/21/2013 11:44 AM Marcelino Duster, NP   ADDENDUM:  [name] has now completed her local treatment--Jackson and radiation--and is ready to start the second portion of her systemic treatment--anti-estrogen therapy. We discussed the different MOAs  of tamoxifen and aromatase inhibitors and reviewed the possible toxicities, side effects and complications of these agents. After much discussion we decided she would start anastrozole. We have set her up for a baseline bone density and she will return to see [place] in February to assess response. If she tolerates the anastrozole well the plan will be to  continue that for at lest 2 years before discussing possibly switching to tamoxifen (depending on bone density results)  I personally saw this patient and performed a substantive portion of this encounter with the listed APP documented above.   Chauncey Cruel, MD

## 2013-12-21 NOTE — Progress Notes (Signed)
Pt given the emergency chemo card and handout.  RN explained purpose of card and to show it any time she is in the ER for a fever (or other s/s) of chemotherapy complications.

## 2013-12-22 ENCOUNTER — Telehealth: Payer: Self-pay | Admitting: Nurse Practitioner

## 2013-12-22 NOTE — Addendum Note (Signed)
Addended by: Marcelino Duster on: 12/22/2013 09:34 AM   Modules accepted: Level of Service

## 2013-12-22 NOTE — Telephone Encounter (Signed)
, °

## 2013-12-23 NOTE — Addendum Note (Signed)
Addended by: Chauncey Cruel on: 12/23/2013 08:22 AM   Modules accepted: Level of Service

## 2013-12-29 ENCOUNTER — Encounter (HOSPITAL_COMMUNITY): Payer: Self-pay

## 2013-12-29 ENCOUNTER — Ambulatory Visit: Payer: Medicare Other | Admitting: Radiation Oncology

## 2013-12-29 ENCOUNTER — Telehealth (HOSPITAL_COMMUNITY): Payer: Self-pay | Admitting: Vascular Surgery

## 2013-12-29 ENCOUNTER — Ambulatory Visit (HOSPITAL_COMMUNITY)
Admission: RE | Admit: 2013-12-29 | Discharge: 2013-12-29 | Disposition: A | Discharge status: Home / Self Care | Payer: Medicare Other | Source: Ambulatory Visit | Attending: Internal Medicine | Admitting: Internal Medicine

## 2013-12-29 ENCOUNTER — Other Ambulatory Visit: Payer: Medicare Other

## 2013-12-29 VITALS — BP 132/70 | HR 88 | Wt 263.0 lb

## 2013-12-29 DIAGNOSIS — I10 Essential (primary) hypertension: Secondary | ICD-10-CM | POA: Insufficient documentation

## 2013-12-29 DIAGNOSIS — R0789 Other chest pain: Secondary | ICD-10-CM

## 2013-12-29 DIAGNOSIS — I4892 Unspecified atrial flutter: Secondary | ICD-10-CM | POA: Diagnosis not present

## 2013-12-29 DIAGNOSIS — E0590 Thyrotoxicosis, unspecified without thyrotoxic crisis or storm: Secondary | ICD-10-CM | POA: Diagnosis not present

## 2013-12-29 DIAGNOSIS — I340 Nonrheumatic mitral (valve) insufficiency: Secondary | ICD-10-CM | POA: Diagnosis not present

## 2013-12-29 DIAGNOSIS — C50411 Malignant neoplasm of upper-outer quadrant of right female breast: Secondary | ICD-10-CM | POA: Insufficient documentation

## 2013-12-29 DIAGNOSIS — R1013 Epigastric pain: Secondary | ICD-10-CM | POA: Insufficient documentation

## 2013-12-29 DIAGNOSIS — K219 Gastro-esophageal reflux disease without esophagitis: Secondary | ICD-10-CM

## 2013-12-29 DIAGNOSIS — N182 Chronic kidney disease, stage 2 (mild): Secondary | ICD-10-CM

## 2013-12-29 DIAGNOSIS — K2900 Acute gastritis without bleeding: Secondary | ICD-10-CM | POA: Insufficient documentation

## 2013-12-29 HISTORY — DX: Reserved for inherently not codable concepts without codable children: IMO0001

## 2013-12-29 HISTORY — DX: Reserved for concepts with insufficient information to code with codable children: IMO0002

## 2013-12-29 LAB — BASIC METABOLIC PANEL
Anion gap: 14 (ref 5–15)
BUN: 19 mg/dL (ref 6–23)
CO2: 25 mEq/L (ref 19–32)
Calcium: 9.4 mg/dL (ref 8.4–10.5)
Chloride: 100 mEq/L (ref 96–112)
Creatinine, Ser: 0.94 mg/dL (ref 0.50–1.10)
GFR calc non Af Amer: 65 mL/min — ABNORMAL LOW (ref >90)
GFR, EST AFRICAN AMERICAN: 75 mL/min — AB (ref >90)
GLUCOSE: 144 mg/dL — AB (ref 70–99)
POTASSIUM: 3.9 meq/L (ref 3.7–5.3)
Sodium: 139 mEq/L (ref 137–147)

## 2013-12-29 MED ORDER — OMEPRAZOLE 20 MG PO CPDR: 20.0000 mg | DELAYED_RELEASE_CAPSULE | Freq: Every day | ORAL | Status: DC

## 2013-12-29 MED ORDER — APIXABAN 5 MG PO TABS: 5.0000 mg | ORAL_TABLET | Freq: Two times a day (BID) | ORAL | Status: DC

## 2013-12-29 NOTE — Progress Notes (Signed)
Patient ID: KASSAUNDRA HAIR, female   DOB: 1955/12/18, 58 y.o.   MRN: [id number]   PCP: [facility] Cardiologist: previously Dr. Lattie Haw Oncologist: Dr. Humphrey Rolls Patient ID: [name], female   DOB: [birth year], 58 y.o.   MRN: [id number]    HPI:  SAREA FYFE is a 58 y.o. female. 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 The biopsied right lymph node was positive for metastatic disease. She is followed in the Cardio-Oncology clinic for monitoring during Herceptin therapy.   Medical hx notable for morbid obesity, DM2, Graves disease, severe HTN, tobacco abuse (q 1990). Had cardiac cath in 1995 which showed normal coronaries. Lexiscan 2/14: Normal  Completed 6 cycles  Taxotere carboplatinum Herceptin/perjeta (started 02/23/13)  She continues Herceptin/Perjeta every 3 weeks. And will continue for 1 year - finish March 2016.   Acute Visit: Patient called today stating she felt bad and felt the way she did when she was in AFL and was admitted to the hospital. "Never feels good but has felt worse for the past 2 days." Having indigestion like pain that is better with belching. At baseline she has SOB and reports a little worse than usual. Denies orthopnea, PND or LE edema. Walking with a walker and able to go from garage to clinic with no issues.   ECHO 02/21/13: LVEF 60-65% Grade 1 DD.  lateral s' 10.3 cm/s. GLS -18.4 ECHO 6//29/15 EF 60-65% lat s' 10.9 GLS 20.3  ECHO 9/15: EF 60-65% lat s' not measured GLS -21%   Past Medical History  Diagnosis Date  . Hyperlipidemia   . Obesity   . Asthma     prn neb. and inhaler  . Osteoarthritis 2003    left knee  . Non-insulin dependent type 2 diabetes mellitus   . Graves' disease   . Ophthalmic manifestation of Graves disease   . Hypertension     on multiple meds., has been on med. > 14 yr.  . Dehydration 04/13/2013  . Breast cancer 01/2013    right  . Wears glasses    . GERD (gastroesophageal reflux disease)     Current Outpatient Prescriptions  Medication Sig Dispense Refill  . acetaminophen (TYLENOL) 325 MG tablet Take 2 tablets (650 mg total) by mouth every 6 (six) hours as needed for mild pain or headache.    . albuterol (PROVENTIL HFA;VENTOLIN HFA) 108 (90 BASE) MCG/ACT inhaler Inhale 2 puffs into the lungs every 4 (four) hours as needed. 18 g 2  . amLODipine (NORVASC) 10 MG tablet Take 10 mg by mouth daily.    Marland Kitchen anastrozole (ARIMIDEX) 1 MG tablet Take 1 tablet (1 mg total) by mouth daily. 30 tablet 2  . apixaban (ELIQUIS) 5 MG TABS tablet Take 1 tablet (5 mg total) by mouth 2 (two) times daily. 60 tablet 1  . Artificial Tear Ointment (ARTIFICIAL TEARS) ointment Place 1 drop into both eyes as needed (for grey eye disease).     . benazepril (LOTENSIN) 40 MG tablet TAKE ONE TABLET BY MOUTH ONCE DAILY. 30 tablet 5  . calcium carbonate (TUMS EX) 750 MG chewable tablet Chew 2 tablets by mouth 4 (four) times daily.    . carvedilol (COREG) 12.5 MG tablet Take 1 tablet (12.5 mg total) by mouth 2 (two) times daily. 180 tablet 3  . cholecalciferol (VITAMIN D) 1000 UNITS tablet Take 1,000 Units by mouth daily.    Marland Kitchen gabapentin (NEURONTIN) 100 MG capsule  Take 1 capsule (100 mg total) by mouth 3 (three) times daily. 90 capsule 5  . levothyroxine (SYNTHROID, LEVOTHROID) 100 MCG tablet     . metFORMIN (GLUCOPHAGE) 500 MG tablet TAKE (1) TABLET BY MOUTH TWICE A DAY WITH MEALS (BREAKFAST AND SUPPER). 60 tablet 5  . pravastatin (PRAVACHOL) 80 MG tablet Take 40 mg by mouth at bedtime.    . vitamin B-12 (CYANOCOBALAMIN) 500 MCG tablet Take 500 mcg by mouth daily.     No current facility-administered medications for this encounter.    Allergies  Allergen Reactions  . Procardia [Nifedipine] Other (See Comments)    MIGRAINES  . Diltiazem Itching    History   Social History  . Marital Status: Divorced    Spouse Name: N/A    Number of Children: 2  . Years of  Education: N/A   Occupational History  . Disability    Social History Main Topics  . Smoking status: Former Smoker -- 0.25 packs/day for 20 years    Quit date: 02/10/1991  . Smokeless tobacco: Never Used  . Alcohol Use: No  . Drug Use: No  . Sexual Activity: No   Other Topics Concern  . Not on file   Social History Narrative   Lives in [place]          Family History  Problem Relation Age of Onset  . Heart disease Father   . Lung cancer Father   . Diabetes Mother   . Diabetes Sister   . Hypertension Sister   . Asthma Sister      Danley Danker Vitals:   12/29/13 1152  BP: 132/70  Pulse: 88  Weight: 263 lb (119.296 kg)  SpO2: 100%   PHYSICAL EXAM: General:  Well appearing. No respiratory difficulty Daughter present  HEENT: normal x for exophthalmosis  Neck: supple. no JVD. Carotids 2+ bilat; no bruits. No lymphadenopathy or thryomegaly appreciated. Cor: PMI nondisplaced. Regular rate & rhythm. No rubs, gallops or murmurs. Keloid noted central aspect of her chest.  Lungs: clear Abdomen: obese soft, nontender, nondistended. No hepatosplenomegaly. No bruits or masses. Good bowel sounds. Extremities: no cyanosis, clubbing, rash, tr edema Neuro: alert & oriented x 3, cranial nerves grossly intact. moves all 4 extremities w/o difficulty. Affect pleasant.  EKG: NSR 75 bpm  ASSESSMENT & PLAN: 1. R breast cancer - Has repeat Echo in January. All echo's previously have had stable parameters. No HF on exam.   2. HTN  - controlled. Continue current medicaitons.  3. 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 Discussed how she can try to check pulse to see if in AFL.  Continue Eliquis, no bleeding issues.  If recurs can consider ablation 4. 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 Hyperthyroidism - followedd at [facility]    --TSH ok 10/15 6. ?OSA    --with AFL, previous snoring and difficult to control HTN I am  concerned about OSA. Pending ONOX.  7. Dyspepsia/CP - reports that she has had some indegistion like pain for past few days and is belching frequently. She was on omeprazole in the past and will restart. Check BMET   F/U 01/2014 with Echo Junie Bame B NP-C  11:57 AM

## 2013-12-29 NOTE — Patient Instructions (Signed)
Start Omeprazole 20 mg daily  Lab today  Follow up in January with an echocardiogram, we will call you to schedule

## 2013-12-29 NOTE — Telephone Encounter (Signed)
Pt called she have pressure in her chest,, she does not feel like her self sates pt.. Little SOB She wants to be seen today if possible .Marland Kitchen Please advise

## 2013-12-29 NOTE — Telephone Encounter (Signed)
Spoke w/pt she states chest pressure was off/on yesterday and has continue today, she states she generally just doesn't feel well, she states this is similar but not as bad as when she was admitted to hospital in Oct for a-flutter, appt sch for today at 11:15

## 2013-12-30 ENCOUNTER — Ambulatory Visit
Admission: RE | Admit: 2013-12-30 | Discharge: 2013-12-30 | Disposition: A | Discharge status: Home / Self Care | Payer: Medicare Other | Source: Ambulatory Visit | Attending: Radiation Oncology | Admitting: Radiation Oncology

## 2013-12-30 ENCOUNTER — Ambulatory Visit (HOSPITAL_COMMUNITY): Payer: Medicare Other

## 2013-12-30 ENCOUNTER — Encounter: Payer: Self-pay | Admitting: *Deleted

## 2013-12-30 DIAGNOSIS — C50411 Malignant neoplasm of upper-outer quadrant of right female breast: Secondary | ICD-10-CM

## 2013-12-30 NOTE — Progress Notes (Signed)
   Department of Radiation Oncology  Phone:  [phone] Fax:        [phone]   Name: [name] MRN: [id number]  DOB: 1955-02-03  Date: 12/30/2013  Follow Up Visit Note  Diagnosis: Breast cancer of upper-outer quadrant of right female breast   Staging form: Breast, AJCC 7th Edition     Clinical: Stage IIA (T1c, N1, cM0) - Unsigned       Staging comments: Staged at breast conference 02/09/13  Summary and Interval since last radiation: 1 month from 62 Gy to the right brast on clinical trial.   Interval History: [name] presents today for routine followup.  She has healed up well. She is gaining energy. She had some chest symptoms but saw cardiology and they ruled out her heart and thought it was probably GERD. She started arimidex and is tolerating that well. She is pleased with her cosmetic result.   Physical Exam:  There were no vitals filed for this visit. Mild skin tanning over the right breast (inframammary fold worse than rest of breast) and some edema near the nipple. No evidence of recurrence.  IMPRESSION: [name] is a 58 y.o. female s/p radiation with resolving acute effects of treatment.   PLAN:  She is doing well. We discussed the need for follow up every 4-6 months which she has scheduled.  We discussed the need for yearly mammograms which she can schedule with her OBGYN or with medical oncology. We discussed the need for sun protection in the treated area.  She can always call me with questions.  I will follow up with her on an as needed basis.   She was seen by our trial nurse.    Thea Silversmith, MD

## 2013-12-30 NOTE — Progress Notes (Signed)
12/30/13 at 5:07pm - The pt was into the cancer center for her 30 days post RT appt visit.  The pt reports she has been doing well. She states she has mild fatigue (grade 1), and her neuropathy is improving (grade 1).  The pt was diagnosed with dyspepsia and given omeprazole for this problem (dyspepsia -grade 2) on 12/29/13.  The pt was seen and examined today by Dr. Pablo Ledger.  Dr. Pablo Ledger reviewed the AE form with the pt and the research nurse.  Dr. Pablo Ledger confirmed that the pt has no reportable AE's other than a grade 2 dyspepsia.  The pt was thanked for her continued support of this trial.  The pt was informed that her next follow up visit will be in February 2016.  The pt was informed that she will need to complete her QOL questionnaires during this visit ( 6 month after randomization visit).  The pt verbalized understanding.

## 2013-12-30 NOTE — Addendum Note (Signed)
Encounter addended by: Vanessa Barbara, CCT on: 12/30/2013  8:30 AM<BR>     Documentation filed: Charges VN

## 2013-12-30 NOTE — Progress Notes (Signed)
Routine one month follow up radiation to right [employer].ch 50 Gy in 25 fractions at 2 Gy/fx and boost of 12 Gy at 2 Y/fx x 6 fractions.Patient tolerated treatment well except moderate skin discoloration and wet desqumation and faitgue.Patient did experience some heart irregularities (atrial flutter) and had to be hospitalized and cardioverted.Patient is part of research protocol being followed by Doristine Johns RN.Patient's skin looks great but needs to continue moisturizer a little longer for mild discoloration and dry skin.Started arimidex 12/21/13.Patient was seen by cardiology  on 12/29/13 for chest discomfort and started on omeprazole, states she feels much better today and even ambulated up sidewalk with rolling walker without any problems.

## 2013-12-31 NOTE — Progress Notes (Signed)
Name: [name]   MRN: [id number]  Date:  11/02/13  DOB: [birth year]  Status:outpatient    DIAGNOSIS: Right breast cancer  CONSENT VERIFIED: yes   SET UP: Patient is setup supine   IMMOBILIZATION:  The following immobilization was used:Custom Moldable Pillow, breast board.   NARRATIVE: [name] underwent complex simulation and treatment planning for her boost treatment today.  Her tumor volume was outlined on the planning CT scan.  Due to the depth of her cavity, electrons could not be used and a photon plan was developed. The plan will be prescribed to the 100% isodose line using 6 and 10 MV photons  I personally supervised and approved the creation of 3 unique MLCs comprising 3  treatment devices.

## 2013-12-31 NOTE — Progress Notes (Signed)
  Radiation Oncology         [phone] ________________________________  Name: [name] MRN: [id number]  Date: 11/22/13  DOB: [birth year]  Simulation Verification Note  NARRATIVE: The patient was brought to the treatment unit and placed in the planned treatment position. The clinical setup was verified. Then port films were obtained and uploaded to the radiation oncology medical record software.  The treatment beams were carefully compared against the planned radiation fields. The position location and shape of the radiation fields was reviewed. The targeted volume of tissue appears appropriately covered by the radiation beams. Organs at risk appear to be excluded as planned.  Based on my personal review, I approved the simulation verification. The patient's treatment will proceed as planned.  ------------------------------------------------  Thea Silversmith, MD

## 2014-01-11 ENCOUNTER — Other Ambulatory Visit: Payer: Medicare Other

## 2014-01-11 ENCOUNTER — Other Ambulatory Visit (HOSPITAL_BASED_OUTPATIENT_CLINIC_OR_DEPARTMENT_OTHER): Payer: Medicare Other

## 2014-01-11 ENCOUNTER — Ambulatory Visit (HOSPITAL_BASED_OUTPATIENT_CLINIC_OR_DEPARTMENT_OTHER): Payer: Medicare Other

## 2014-01-11 ENCOUNTER — Ambulatory Visit: Payer: Medicare Other | Admitting: Nurse Practitioner

## 2014-01-11 DIAGNOSIS — C50411 Malignant neoplasm of upper-outer quadrant of right female breast: Secondary | ICD-10-CM

## 2014-01-11 DIAGNOSIS — Z5112 Encounter for antineoplastic immunotherapy: Secondary | ICD-10-CM

## 2014-01-11 LAB — CBC WITH DIFFERENTIAL/PLATELET
BASO%: 0.2 % (ref 0.0–2.0)
Basophils Absolute: 0 10*3/uL (ref 0.0–0.1)
EOS%: 2.3 % (ref 0.0–7.0)
Eosinophils Absolute: 0.1 10*3/uL (ref 0.0–0.5)
HCT: 33.9 % — ABNORMAL LOW (ref 34.8–46.6)
HGB: 11.1 g/dL — ABNORMAL LOW (ref 11.6–15.9)
LYMPH%: 22.3 % (ref 14.0–49.7)
MCH: 27.3 pg (ref 25.1–34.0)
MCHC: 32.7 g/dL (ref 31.5–36.0)
MCV: 83.3 fL (ref 79.5–101.0)
MONO#: 0.3 10*3/uL (ref 0.1–0.9)
MONO%: 7.7 % (ref 0.0–14.0)
NEUT#: 3 10*3/uL (ref 1.5–6.5)
NEUT%: 67.5 % (ref 38.4–76.8)
PLATELETS: 161 10*3/uL (ref 145–400)
RBC: 4.07 10*6/uL (ref 3.70–5.45)
RDW: 15.5 % — ABNORMAL HIGH (ref 11.2–14.5)
WBC: 4.4 10*3/uL (ref 3.9–10.3)
lymph#: 1 10*3/uL (ref 0.9–3.3)

## 2014-01-11 LAB — COMPREHENSIVE METABOLIC PANEL (CC13)
ALBUMIN: 3.7 g/dL (ref 3.5–5.0)
ALT: 11 U/L (ref 0–55)
ANION GAP: 9 meq/L (ref 3–11)
AST: 17 U/L (ref 5–34)
Alkaline Phosphatase: 84 U/L (ref 40–150)
BUN: 23.2 mg/dL (ref 7.0–26.0)
CALCIUM: 8.9 mg/dL (ref 8.4–10.4)
CHLORIDE: 107 meq/L (ref 98–109)
CO2: 29 meq/L (ref 22–29)
Creatinine: 1.4 mg/dL — ABNORMAL HIGH (ref 0.6–1.1)
EGFR: 47 mL/min/{1.73_m2} — ABNORMAL LOW (ref >90)
GLUCOSE: 202 mg/dL — AB (ref 70–140)
Potassium: 4.1 mEq/L (ref 3.5–5.1)
Sodium: 145 mEq/L (ref 136–145)
Total Bilirubin: 0.51 mg/dL (ref 0.20–1.20)
Total Protein: 6.9 g/dL (ref 6.4–8.3)

## 2014-01-11 MED ORDER — DIPHENHYDRAMINE HCL 25 MG PO CAPS
25.0000 mg | ORAL_CAPSULE | Freq: Once | ORAL | Status: AC
  Administered 2014-01-11: 25 mg via ORAL

## 2014-01-11 MED ORDER — TRASTUZUMAB CHEMO INJECTION 440 MG
6.0000 mg/kg | Freq: Once | INTRAVENOUS | Status: AC
  Administered 2014-01-11: 714 mg via INTRAVENOUS
  Filled 2014-01-11: qty 34

## 2014-01-11 MED ORDER — DIPHENHYDRAMINE HCL 25 MG PO CAPS
ORAL_CAPSULE | ORAL | Status: AC
  Filled 2014-01-11: qty 1

## 2014-01-11 MED ORDER — ACETAMINOPHEN 325 MG PO TABS
650.0000 mg | ORAL_TABLET | Freq: Once | ORAL | Status: AC
  Administered 2014-01-11: 650 mg via ORAL

## 2014-01-11 MED ORDER — ACETAMINOPHEN 325 MG PO TABS
ORAL_TABLET | ORAL | Status: AC
  Filled 2014-01-11: qty 2

## 2014-01-11 MED ORDER — SODIUM CHLORIDE 0.9 % IJ SOLN
10.0000 mL | INTRAMUSCULAR | Status: DC | PRN
  Administered 2014-01-11: 10 mL
  Filled 2014-01-11: qty 10

## 2014-01-11 MED ORDER — HEPARIN SOD (PORK) LOCK FLUSH 100 UNIT/ML IV SOLN
500.0000 [IU] | Freq: Once | INTRAVENOUS | Status: AC | PRN
  Administered 2014-01-11: 500 [IU]
  Filled 2014-01-11: qty 5

## 2014-01-11 MED ORDER — SODIUM CHLORIDE 0.9 % IV SOLN
Freq: Once | INTRAVENOUS | Status: AC
  Administered 2014-01-11: 11:00:00 via INTRAVENOUS

## 2014-01-11 NOTE — Patient Instructions (Signed)
[facility] Discharge Instructions for Patients Receiving Chemotherapy  Today you received the following chemotherapy agents:  Herceptin  To help prevent nausea and vomiting after your treatment, we encourage you to take your nausea medication as ordered per MD.   If you develop nausea and vomiting that is not controlled by your nausea medication, call the clinic.   BELOW ARE SYMPTOMS THAT SHOULD BE REPORTED IMMEDIATELY:  *FEVER GREATER THAN 100.5 F  *CHILLS WITH OR WITHOUT FEVER  NAUSEA AND VOMITING THAT IS NOT CONTROLLED WITH YOUR NAUSEA MEDICATION  *UNUSUAL SHORTNESS OF BREATH  *UNUSUAL BRUISING OR BLEEDING  TENDERNESS IN MOUTH AND THROAT WITH OR WITHOUT PRESENCE OF ULCERS  *URINARY PROBLEMS  *BOWEL PROBLEMS  UNUSUAL RASH Items with * indicate a potential emergency and should be followed up as soon as possible.  Feel free to call the clinic you have any questions or concerns. The clinic phone number is [phone].    

## 2014-01-17 ENCOUNTER — Ambulatory Visit
Admission: RE | Admit: 2014-01-17 | Discharge: 2014-01-17 | Disposition: A | Discharge status: Home / Self Care | Payer: Medicare Other | Source: Ambulatory Visit | Attending: Nurse Practitioner | Admitting: Nurse Practitioner

## 2014-01-17 DIAGNOSIS — C50411 Malignant neoplasm of upper-outer quadrant of right female breast: Secondary | ICD-10-CM

## 2014-01-26 ENCOUNTER — Telehealth (HOSPITAL_COMMUNITY): Payer: Self-pay | Admitting: Cardiology

## 2014-01-26 ENCOUNTER — Other Ambulatory Visit: Payer: Self-pay | Admitting: Family Medicine

## 2014-01-26 DIAGNOSIS — G4734 Idiopathic sleep related nonobstructive alveolar hypoventilation: Secondary | ICD-10-CM

## 2014-01-26 NOTE — Telephone Encounter (Signed)
Pt aware, order placed for  pulm, sleep study

## 2014-01-26 NOTE — Telephone Encounter (Signed)
Attempting to contact pt regarding overnight OX Per Dr.Bensimhon- significant desats, needs sleep study

## 2014-02-01 ENCOUNTER — Ambulatory Visit (HOSPITAL_BASED_OUTPATIENT_CLINIC_OR_DEPARTMENT_OTHER): Payer: Medicare Other

## 2014-02-01 ENCOUNTER — Other Ambulatory Visit: Payer: Self-pay | Admitting: Nurse Practitioner

## 2014-02-01 ENCOUNTER — Other Ambulatory Visit (HOSPITAL_BASED_OUTPATIENT_CLINIC_OR_DEPARTMENT_OTHER): Payer: Medicare Other

## 2014-02-01 DIAGNOSIS — Z5112 Encounter for antineoplastic immunotherapy: Secondary | ICD-10-CM

## 2014-02-01 DIAGNOSIS — C50411 Malignant neoplasm of upper-outer quadrant of right female breast: Secondary | ICD-10-CM | POA: Diagnosis not present

## 2014-02-01 LAB — COMPREHENSIVE METABOLIC PANEL (CC13)
ALBUMIN: 3.8 g/dL (ref 3.5–5.0)
ALT: 14 U/L (ref 0–55)
ANION GAP: 10 meq/L (ref 3–11)
AST: 18 U/L (ref 5–34)
Alkaline Phosphatase: 90 U/L (ref 40–150)
BUN: 13.5 mg/dL (ref 7.0–26.0)
CALCIUM: 8.7 mg/dL (ref 8.4–10.4)
CHLORIDE: 106 meq/L (ref 98–109)
CO2: 28 meq/L (ref 22–29)
Creatinine: 1.1 mg/dL (ref 0.6–1.1)
EGFR: 66 mL/min/{1.73_m2} — ABNORMAL LOW (ref >90)
Glucose: 157 mg/dl — ABNORMAL HIGH (ref 70–140)
POTASSIUM: 4.2 meq/L (ref 3.5–5.1)
SODIUM: 144 meq/L (ref 136–145)
TOTAL PROTEIN: 7.1 g/dL (ref 6.4–8.3)
Total Bilirubin: 0.42 mg/dL (ref 0.20–1.20)

## 2014-02-01 LAB — CBC WITH DIFFERENTIAL/PLATELET
BASO%: 0.3 % (ref 0.0–2.0)
Basophils Absolute: 0 10*3/uL (ref 0.0–0.1)
EOS ABS: 0.1 10*3/uL (ref 0.0–0.5)
EOS%: 3.3 % (ref 0.0–7.0)
HCT: 34.4 % — ABNORMAL LOW (ref 34.8–46.6)
HGB: 11.3 g/dL — ABNORMAL LOW (ref 11.6–15.9)
LYMPH%: 28.8 % (ref 14.0–49.7)
MCH: 27.3 pg (ref 25.1–34.0)
MCHC: 32.8 g/dL (ref 31.5–36.0)
MCV: 83.1 fL (ref 79.5–101.0)
MONO#: 0.3 10*3/uL (ref 0.1–0.9)
MONO%: 7.3 % (ref 0.0–14.0)
NEUT%: 60.3 % (ref 38.4–76.8)
NEUTROS ABS: 2.4 10*3/uL (ref 1.5–6.5)
PLATELETS: 172 10*3/uL (ref 145–400)
RBC: 4.14 10*6/uL (ref 3.70–5.45)
RDW: 15.5 % — ABNORMAL HIGH (ref 11.2–14.5)
WBC: 4 10*3/uL (ref 3.9–10.3)
lymph#: 1.1 10*3/uL (ref 0.9–3.3)

## 2014-02-01 MED ORDER — GABAPENTIN 100 MG PO CAPS: 100.0000 mg | ORAL_CAPSULE | Freq: Three times a day (TID) | ORAL | Status: DC

## 2014-02-01 MED ORDER — SODIUM CHLORIDE 0.9 % IV SOLN
Freq: Once | INTRAVENOUS | Status: AC
  Administered 2014-02-01: 11:00:00 via INTRAVENOUS

## 2014-02-01 MED ORDER — SODIUM CHLORIDE 0.9 % IJ SOLN
10.0000 mL | INTRAMUSCULAR | Status: DC | PRN
  Administered 2014-02-01: 10 mL
  Filled 2014-02-01: qty 10

## 2014-02-01 MED ORDER — HEPARIN SOD (PORK) LOCK FLUSH 100 UNIT/ML IV SOLN
500.0000 [IU] | Freq: Once | INTRAVENOUS | Status: AC | PRN
  Administered 2014-02-01: 500 [IU]
  Filled 2014-02-01: qty 5

## 2014-02-01 MED ORDER — DIPHENHYDRAMINE HCL 25 MG PO CAPS
ORAL_CAPSULE | ORAL | Status: AC
  Filled 2014-02-01: qty 1

## 2014-02-01 MED ORDER — TRASTUZUMAB CHEMO INJECTION 440 MG
6.0000 mg/kg | Freq: Once | INTRAVENOUS | Status: AC
  Administered 2014-02-01: 714 mg via INTRAVENOUS
  Filled 2014-02-01: qty 34

## 2014-02-01 MED ORDER — ACETAMINOPHEN 325 MG PO TABS
650.0000 mg | ORAL_TABLET | Freq: Once | ORAL | Status: AC
  Administered 2014-02-01: 650 mg via ORAL

## 2014-02-01 MED ORDER — ACETAMINOPHEN 325 MG PO TABS
ORAL_TABLET | ORAL | Status: AC
  Filled 2014-02-01: qty 2

## 2014-02-01 MED ORDER — OXYCODONE-ACETAMINOPHEN 5-325 MG PO TABS: 1.0000 | ORAL_TABLET | Freq: Four times a day (QID) | ORAL | Status: DC | PRN

## 2014-02-01 MED ORDER — DIPHENHYDRAMINE HCL 25 MG PO CAPS
25.0000 mg | ORAL_CAPSULE | Freq: Once | ORAL | Status: AC
  Administered 2014-02-01: 25 mg via ORAL

## 2014-02-01 NOTE — Patient Instructions (Signed)
[facility] Discharge Instructions for Patients   Today you received the following: Herceptin  To help prevent nausea and vomiting after your treatment, we encourage you to take your nausea medication as prescribed.    If you develop nausea and vomiting that is not controlled by your nausea medication, call the clinic.   BELOW ARE SYMPTOMS THAT SHOULD BE REPORTED IMMEDIATELY:  *FEVER GREATER THAN 100.5 F  *CHILLS WITH OR WITHOUT FEVER  NAUSEA AND VOMITING THAT IS NOT CONTROLLED WITH YOUR NAUSEA MEDICATION  *UNUSUAL SHORTNESS OF BREATH  *UNUSUAL BRUISING OR BLEEDING  TENDERNESS IN MOUTH AND THROAT WITH OR WITHOUT PRESENCE OF ULCERS  *URINARY PROBLEMS  *BOWEL PROBLEMS  UNUSUAL RASH Items with * indicate a potential emergency and should be followed up as soon as possible.  Feel free to call the clinic you have any questions or concerns. The clinic phone number is [phone].

## 2014-02-02 ENCOUNTER — Telehealth: Payer: Self-pay | Admitting: Certified Registered Nurse Anesthetist

## 2014-02-06 ENCOUNTER — Encounter: Payer: Self-pay | Admitting: Internal Medicine

## 2014-02-14 ENCOUNTER — Telehealth: Payer: Self-pay | Admitting: Nurse Practitioner

## 2014-02-14 NOTE — Telephone Encounter (Signed)
per HF to move appt to 3/2 per PAL-left inf appt/cld & spoke to pt & adv of new appt time and adv going from lab straight to inf-pt understood

## 2014-02-20 ENCOUNTER — Ambulatory Visit (INDEPENDENT_AMBULATORY_CARE_PROVIDER_SITE_OTHER): Payer: Medicare Other | Admitting: Family Medicine

## 2014-02-20 ENCOUNTER — Other Ambulatory Visit: Payer: Self-pay | Admitting: *Deleted

## 2014-02-20 ENCOUNTER — Encounter: Payer: Self-pay | Admitting: Family Medicine

## 2014-02-20 ENCOUNTER — Telehealth: Payer: Self-pay | Admitting: *Deleted

## 2014-02-20 VITALS — BP 142/90 | Temp 99.2°F | Ht 64.5 in | Wt 263.0 lb

## 2014-02-20 DIAGNOSIS — J329 Chronic sinusitis, unspecified: Secondary | ICD-10-CM

## 2014-02-20 DIAGNOSIS — J310 Chronic rhinitis: Secondary | ICD-10-CM

## 2014-02-20 MED ORDER — PREDNISONE 10 MG PO TABS: ORAL_TABLET | ORAL | Status: DC

## 2014-02-20 MED ORDER — AMOXICILLIN-POT CLAVULANATE 875-125 MG PO TABS: 1.0000 | ORAL_TABLET | Freq: Two times a day (BID) | ORAL | Status: DC

## 2014-02-20 MED ORDER — HYDROCODONE-HOMATROPINE 5-1.5 MG/5ML PO SYRP: ORAL_SOLUTION | ORAL | Status: DC

## 2014-02-20 NOTE — Telephone Encounter (Signed)
Pt called and would like to r/s labs and Herceptin appt on Feb.10, 2016 to a later date. Pt went to PCP and has been dx with bronchitis. Pt is under doctor's care for this condition and need to cancel appts for this week. Message to be forwarded to [facility].

## 2014-02-20 NOTE — Progress Notes (Signed)
   Subjective:    Patient ID: [name], female    DOB: 02-18-1955, 59 y.o.   MRN: [id number]  Cough This is a new problem. The current episode started in the past 7 days. Associated symptoms include ear pain, a fever, headaches, nasal congestion, a sore throat and wheezing. Associated symptoms comments: diarrhea. Treatments tried: robitussin, alka seltzer plus, cold and flu.   Cough prod at times  Wheezy,  6 days duration. Cough productive. Headache frontal.   Review of Systems  Constitutional: Positive for fever.  HENT: Positive for ear pain and sore throat.   Respiratory: Positive for cough and wheezing.   Neurological: Positive for headaches.       Objective:   Physical Exam  Alert diminished energy hydration good HEENT moderate nasal congestion frontal tenderness pharynx normal lungs bilateral wheezes heart rare rhythm      Assessment & Plan:  Impression sinusitis bronchitis with exacerbation of reactive airways plan prednisone taper. Hycodan daily at bedtime. Augmentin twice a day 10 days. Follow-up regular appointment.

## 2014-02-21 ENCOUNTER — Telehealth: Payer: Self-pay | Admitting: Nurse Practitioner

## 2014-02-21 ENCOUNTER — Institutional Professional Consult (permissible substitution): Payer: Medicare Other | Admitting: Pulmonary Disease

## 2014-02-21 ENCOUNTER — Other Ambulatory Visit: Payer: Self-pay | Admitting: *Deleted

## 2014-02-21 ENCOUNTER — Telehealth: Payer: Self-pay | Admitting: *Deleted

## 2014-02-21 NOTE — Progress Notes (Signed)
This RN was notified by pharmacy that pt is overdue for ECHO per herceptin therapy.  This RN reviewed chart - noted pt is followed by Dr Haroldine Laws with order for echo in system.  This RN called above clinic and spoke with [facility] who states she will contact pt to schedule ECHO and follow up.

## 2014-02-21 NOTE — Telephone Encounter (Signed)
Per staff message and POF I have scheduled appts. Advised scheduler of appts. JMW  

## 2014-02-21 NOTE — Telephone Encounter (Signed)
per pof to sch pt appt-cld & spoke to pt and gave pt time & date-pt understood

## 2014-02-22 ENCOUNTER — Other Ambulatory Visit: Payer: Medicare Other

## 2014-02-22 ENCOUNTER — Ambulatory Visit: Payer: Medicare Other

## 2014-02-22 ENCOUNTER — Ambulatory Visit: Payer: Medicare Other | Admitting: Nurse Practitioner

## 2014-03-01 ENCOUNTER — Ambulatory Visit (HOSPITAL_BASED_OUTPATIENT_CLINIC_OR_DEPARTMENT_OTHER): Payer: Medicare Other | Admitting: Nurse Practitioner

## 2014-03-01 ENCOUNTER — Other Ambulatory Visit: Payer: Self-pay | Admitting: Nurse Practitioner

## 2014-03-01 ENCOUNTER — Ambulatory Visit (HOSPITAL_BASED_OUTPATIENT_CLINIC_OR_DEPARTMENT_OTHER): Payer: Medicare Other

## 2014-03-01 ENCOUNTER — Other Ambulatory Visit: Payer: Self-pay | Admitting: *Deleted

## 2014-03-01 ENCOUNTER — Telehealth: Payer: Self-pay | Admitting: Nurse Practitioner

## 2014-03-01 ENCOUNTER — Encounter: Payer: Self-pay | Admitting: Nurse Practitioner

## 2014-03-01 ENCOUNTER — Encounter: Payer: Self-pay | Admitting: *Deleted

## 2014-03-01 ENCOUNTER — Other Ambulatory Visit (HOSPITAL_BASED_OUTPATIENT_CLINIC_OR_DEPARTMENT_OTHER): Payer: Medicare Other

## 2014-03-01 VITALS — BP 148/78 | HR 82 | Temp 98.6°F | Resp 18 | Ht 64.0 in | Wt 264.4 lb

## 2014-03-01 DIAGNOSIS — C773 Secondary and unspecified malignant neoplasm of axilla and upper limb lymph nodes: Secondary | ICD-10-CM

## 2014-03-01 DIAGNOSIS — Z79811 Long term (current) use of aromatase inhibitors: Secondary | ICD-10-CM

## 2014-03-01 DIAGNOSIS — Z1231 Encounter for screening mammogram for malignant neoplasm of breast: Secondary | ICD-10-CM

## 2014-03-01 DIAGNOSIS — Z5112 Encounter for antineoplastic immunotherapy: Secondary | ICD-10-CM

## 2014-03-01 DIAGNOSIS — C50411 Malignant neoplasm of upper-outer quadrant of right female breast: Secondary | ICD-10-CM

## 2014-03-01 LAB — CBC WITH DIFFERENTIAL/PLATELET
BASO%: 0.1 % (ref 0.0–2.0)
Basophils Absolute: 0 10*3/uL (ref 0.0–0.1)
EOS%: 1.5 % (ref 0.0–7.0)
Eosinophils Absolute: 0.1 10*3/uL (ref 0.0–0.5)
HCT: 35.4 % (ref 34.8–46.6)
HGB: 11.6 g/dL (ref 11.6–15.9)
LYMPH%: 23.1 % (ref 14.0–49.7)
MCH: 27 pg (ref 25.1–34.0)
MCHC: 32.8 g/dL (ref 31.5–36.0)
MCV: 82.5 fL (ref 79.5–101.0)
MONO#: 0.6 10*3/uL (ref 0.1–0.9)
MONO%: 8.7 % (ref 0.0–14.0)
NEUT#: 4.8 10*3/uL (ref 1.5–6.5)
NEUT%: 66.6 % (ref 38.4–76.8)
PLATELETS: 226 10*3/uL (ref 145–400)
RBC: 4.29 10*6/uL (ref 3.70–5.45)
RDW: 15.7 % — ABNORMAL HIGH (ref 11.2–14.5)
WBC: 7.2 10*3/uL (ref 3.9–10.3)
lymph#: 1.7 10*3/uL (ref 0.9–3.3)

## 2014-03-01 LAB — COMPREHENSIVE METABOLIC PANEL (CC13)
ALK PHOS: 82 U/L (ref 40–150)
ALT: 11 U/L (ref 0–55)
AST: 13 U/L (ref 5–34)
Albumin: 3.6 g/dL (ref 3.5–5.0)
Anion Gap: 11 mEq/L (ref 3–11)
BUN: 18.1 mg/dL (ref 7.0–26.0)
CHLORIDE: 102 meq/L (ref 98–109)
CO2: 29 mEq/L (ref 22–29)
CREATININE: 1.3 mg/dL — AB (ref 0.6–1.1)
Calcium: 9.3 mg/dL (ref 8.4–10.4)
EGFR: 54 mL/min/{1.73_m2} — ABNORMAL LOW (ref >90)
Glucose: 185 mg/dl — ABNORMAL HIGH (ref 70–140)
POTASSIUM: 4.6 meq/L (ref 3.5–5.1)
Sodium: 142 mEq/L (ref 136–145)
Total Bilirubin: 0.32 mg/dL (ref 0.20–1.20)
Total Protein: 7 g/dL (ref 6.4–8.3)

## 2014-03-01 MED ORDER — SODIUM CHLORIDE 0.9 % IV SOLN
Freq: Once | INTRAVENOUS | Status: AC
  Administered 2014-03-01: 16:00:00 via INTRAVENOUS

## 2014-03-01 MED ORDER — DIPHENHYDRAMINE HCL 25 MG PO CAPS
25.0000 mg | ORAL_CAPSULE | Freq: Once | ORAL | Status: AC
  Administered 2014-03-01: 25 mg via ORAL

## 2014-03-01 MED ORDER — ACETAMINOPHEN 325 MG PO TABS
ORAL_TABLET | ORAL | Status: AC
  Filled 2014-03-01: qty 2

## 2014-03-01 MED ORDER — HEPARIN SOD (PORK) LOCK FLUSH 100 UNIT/ML IV SOLN
500.0000 [IU] | Freq: Once | INTRAVENOUS | Status: AC | PRN
  Administered 2014-03-01: 500 [IU]
  Filled 2014-03-01: qty 5

## 2014-03-01 MED ORDER — SODIUM CHLORIDE 0.9 % IJ SOLN
10.0000 mL | INTRAMUSCULAR | Status: DC | PRN
  Administered 2014-03-01: 10 mL
  Filled 2014-03-01: qty 10

## 2014-03-01 MED ORDER — ANASTROZOLE 1 MG PO TABS: 1.0000 mg | ORAL_TABLET | Freq: Every day | ORAL | Status: DC

## 2014-03-01 MED ORDER — ACETAMINOPHEN 325 MG PO TABS
650.0000 mg | ORAL_TABLET | Freq: Once | ORAL | Status: AC
  Administered 2014-03-01: 650 mg via ORAL

## 2014-03-01 MED ORDER — DIPHENHYDRAMINE HCL 25 MG PO CAPS
ORAL_CAPSULE | ORAL | Status: AC
  Filled 2014-03-01: qty 1

## 2014-03-01 MED ORDER — SODIUM CHLORIDE 0.9 % IV SOLN
6.0000 mg/kg | Freq: Once | INTRAVENOUS | Status: AC
  Administered 2014-03-01: 714 mg via INTRAVENOUS
  Filled 2014-03-01: qty 34

## 2014-03-01 NOTE — Patient Instructions (Signed)
[facility] Discharge Instructions for Patients Receiving Chemotherapy  Today you received the following chemotherapy agents herceptin   To help prevent nausea and vomiting after your treatment, we encourage you to take your nausea medication as directed   If you develop nausea and vomiting that is not controlled by your nausea medication, call the clinic.   BELOW ARE SYMPTOMS THAT SHOULD BE REPORTED IMMEDIATELY:  *FEVER GREATER THAN 100.5 F  *CHILLS WITH OR WITHOUT FEVER  NAUSEA AND VOMITING THAT IS NOT CONTROLLED WITH YOUR NAUSEA MEDICATION  *UNUSUAL SHORTNESS OF BREATH  *UNUSUAL BRUISING OR BLEEDING  TENDERNESS IN MOUTH AND THROAT WITH OR WITHOUT PRESENCE OF ULCERS  *URINARY PROBLEMS  *BOWEL PROBLEMS  UNUSUAL RASH Items with * indicate a potential emergency and should be followed up as soon as possible.  Feel free to call the clinic you have any questions or concerns. The clinic phone number is [phone].  

## 2014-03-01 NOTE — Progress Notes (Signed)
03/01/14 at 3:07pm - NSABP B-51/RTOG 1304 - 6 months post randomization visit- The pt was into the cancer center today for her 6 months post randomization visit.  The pt was greeted upon arrival and given her 6 month questionnaires [facility]) to complete   The research nurse reviewed the pt's completed questionnaires (PRO's) for accuracy and completeness.  The pt was seen and examined today by Susanne Borders, Dr. Virgie Dad NP.  The pt was notified that her next on-study visit will be in 6 months around August 2016.  The pt verbalized understanding.  The pt said that she has 3 more doses trastuzumab. The pt was thanked for her continued support of this clinical trial.   Brion Aliment RN, BSN, CCRP Clinical Research Nurse 03/01/2014 4:12 PM

## 2014-03-01 NOTE — Telephone Encounter (Signed)
per pof to sch pt appt-sent linda email to pre-cert [facility] MW email to sch trmt-cld Forestine Na to sch mamma per  pof-gave pt copy of sch-pt aware of times for trmt following lab appts

## 2014-03-01 NOTE — Progress Notes (Signed)
[facility]  Telephone:([phone]) [phone] Fax:(336) [phone]    ID: [name] OB: Feb 20, 1955  MR#: [id number]  BWL#:[id number]  PCP: Rubbie Battiest, MD GYN:   SU: Dr. Erroll Luna OTHER MD: Dr. Thea Silversmith  CHIEF COMPLAINT:  Locally advanced breast cancer TREATMENT: Receiving anti-HER-2 immunotherapy; to begin anti-estrogen therapy  BREAST CANCER HISTORY: From Dr Dana Allan original intake note:  The patient herself noted a change in her right breast November 2014, but did not tell her family until after Christmas at the year. They "pretty much forced me" to have a mammogram and bilateral diagnostic mammography and right ultrasound 01/19/2013 at the breast Center showed an irregular mass associated with pleomorphic calcifications in the upper-outer quadrant of the right breast measuring 1.6 cm. There was an abnormal appearing right axillary lymph node. On physical exam, there was a movable firm palpable mass in the right breast measuring approximately 2 cm by palpation. Ultrasound showed this to be an irregularly marginated hypoechoic mass measuring 1.8 cm. In the right axilla the ultrasound showed a 1.3 cm abnormal appearing level I lymph node (loss a fatty hilum).  The 01/26/2013 the patient underwent biopsy of both the right breast mass in question and a suspicious right axillary lymph node. This showed ([id number]) both the breast mass and axillary lymph node 2 be positive for invasive ductal carcinoma, grade 2 or 3, estrogen receptor 99% positive, progesterone receptor 95% positive, both with strong staining intensity, with an MIB-1 of 70%, and HER-2 amplified at 3+.The patient was unable to undergo MRI because of claustrophobia concerns.   Her subsequent history is as detailed below.  INTERVAL HISTORY: Deborrah returns to clinic today for follow up of her breast cancer, accompanied by her daughter [name]. She is due for trastuzumab today.  She started on anastrozole  at her last visit and is tolerating it well. She has some hot flashes, but prefers to manage these on her own. She has some knee pain, but this is no worse than previously documented. She uses a rolling walker for steadiness. She denies vaginal changes.   REVIEW OF SYSTEMS: Chealsea denies fevers, chills, nausea, vomiting, or changes in bowel or bladder habits. Her appetite is healthy and she is staying well hydrated. The numbness to her fingertips is stable and she is taking 150m gabapentin TID. She has some shortness of breath with exertion. She has no chest pain, palpitations, cough. She has some mild fatigue.  A detailed review of systems is otherwise negative.  PAST MEDICAL HISTORY: Past Medical History  Diagnosis Date  . Hyperlipidemia   . Obesity   . Asthma     prn neb. and inhaler  . Osteoarthritis 2003    left knee  . Non-insulin dependent type 2 diabetes mellitus   . Graves' disease   . Ophthalmic manifestation of Graves disease   . Hypertension     on multiple meds., has been on med. > 14 yr.  . Dehydration 04/13/2013  . Breast cancer 01/2013    right  . Wears glasses   . GERD (gastroesophageal reflux disease)   . Radiation 10/11/13-11/29/13    Right Breast    PAST SURGICAL HISTORY: Past Surgical History  Procedure Laterality Date  . Total knee arthroplasty Left 2003  . Total thyroidectomy    . Colonoscopy  2007  . Tubal ligation  1985  . Colonoscopy w/ polypectomy  2009  . Portacath placement Right 02/17/2013    Procedure: INSERTION PORT-A-CATH;  Surgeon: TJoyice Faster  Cornett, MD;  Location: [facility];  Service: General;  Laterality: Right;  . Tee without cardioversion N/A 11/08/2013    Procedure: TRANSESOPHAGEAL ECHOCARDIOGRAM (TEE);  Surgeon: Lelon Perla, MD;  Location: [facility] ENDOSCOPY;  Service: Cardiovascular;  Laterality: N/A;  . Cardioversion N/A 11/08/2013    Procedure: CARDIOVERSION;  Surgeon: Lelon Perla, MD;  Location: [facility] ENDOSCOPY;   Service: Cardiovascular;  Laterality: N/A;    FAMILY HISTORY Family History  Problem Relation Age of Onset  . Heart disease Father   . Lung cancer Father   . Diabetes Mother   . Diabetes Sister   . Hypertension Sister   . Asthma Sister    the patient's father died from lung cancer at the age of 20. The patient's mother died from thyroid cancer at the age of 15. The patient had 2 brothers, 2 sisters. There is no other history of breast or ovarian cancer in the family to her knowledge  GYN HISTORY: Menarche age 48, first live birth age 28. The patient is GX P2. She went through the change of life approximately age 41. She did not take hormone replacement.  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 She is now disabled secondary to her knee problems. She is divorced. At home she lives with her 2 daughters, [name] who works in direct supports to mentally retarded children, and [name], who is a group Games developer for mentally retarded children. The patient has no grandchildren. She attends a [employer]  ADVANCED DIRECTIVES: Not in place; these have been discussed with the patient, most recently in the 07/28/2013 visit.  HEALTH MAINTENANCE: History  Substance Use Topics  . Smoking status: Former Smoker -- 0.25 packs/day for 20 years    Quit date: 02/10/1991  . Smokeless tobacco: Never Used  . Alcohol Use: No      Allergies  Allergen Reactions  . Procardia [Nifedipine] Other (See Comments)    MIGRAINES  . Diltiazem Itching    Current Outpatient Prescriptions  Medication Sig Dispense Refill  . albuterol (PROVENTIL HFA;VENTOLIN HFA) 108 (90 BASE) MCG/ACT inhaler Inhale 2 puffs into the lungs every 4 (four) hours as needed. 18 g 2  . amLODipine (NORVASC) 10 MG tablet TAKE ONE TABLET BY MOUTH ONCE DAILY. 90 tablet 0  . amoxicillin-clavulanate (AUGMENTIN) 875-125 MG per tablet Take 1 tablet by mouth 2 (two) times  daily. 20 tablet 0  . anastrozole (ARIMIDEX) 1 MG tablet Take 1 tablet (1 mg total) by mouth daily. 30 tablet 2  . apixaban (ELIQUIS) 5 MG TABS tablet Take 1 tablet (5 mg total) by mouth 2 (two) times daily. 60 tablet 6  . Artificial Tear Ointment (ARTIFICIAL TEARS) ointment Place 1 drop into both eyes as needed (for grey eye disease).     . benazepril (LOTENSIN) 40 MG tablet TAKE ONE TABLET BY MOUTH ONCE DAILY. 30 tablet 5  . calcium carbonate (TUMS EX) 750 MG chewable tablet Chew 2 tablets by mouth 4 (four) times daily.    . carvedilol (COREG) 12.5 MG tablet Take 1 tablet (12.5 mg total) by mouth 2 (two) times daily. 180 tablet 3  . cholecalciferol (VITAMIN D) 1000 UNITS tablet Take 1,000 Units by mouth daily.    Marland Kitchen gabapentin (NEURONTIN) 100 MG capsule Take 1 capsule (100 mg total) by mouth 3 (three) times daily. 90 capsule 5  . levothyroxine (SYNTHROID, LEVOTHROID) 100 MCG tablet     . metFORMIN (GLUCOPHAGE)  500 MG tablet TAKE (1) TABLET BY MOUTH TWICE A DAY WITH MEALS (BREAKFAST AND SUPPER). 60 tablet 5  . omeprazole (PRILOSEC) 20 MG capsule Take 1 capsule (20 mg total) by mouth daily. 30 capsule 6  . pravastatin (PRAVACHOL) 80 MG tablet Take 40 mg by mouth at bedtime.    . vitamin B-12 (CYANOCOBALAMIN) 500 MCG tablet Take 500 mcg by mouth daily.    Marland Kitchen acetaminophen (TYLENOL) 325 MG tablet Take 2 tablets (650 mg total) by mouth every 6 (six) hours as needed for mild pain or headache. (Patient not taking: Reported on 03/01/2014)    . oxyCODONE-acetaminophen (PERCOCET/ROXICET) 5-325 MG per tablet Take 1 tablet by mouth every 6 (six) hours as needed for severe pain. (Patient not taking: Reported on 03/01/2014) 60 tablet 0   No current facility-administered medications for this visit.    OBJECTIVE: Middle-aged [place] American woman in no acute distress   Filed Vitals:   03/01/14 1427  BP: 148/78  Pulse: 82  Temp: 98.6 F (37 C)  Resp: 18     Body mass index is 45.36 kg/(m^2).       ECOG: 1  Skin: warm, dry  HEENT: sclerae anicteric, conjunctivae pink, oropharynx clear. No thrush or mucositis.  Lymph Nodes: No cervical or supraclavicular lymphadenopathy  Lungs: clear to auscultation bilaterally, no rales, wheezes, or rhonci  Heart: regular rate and rhythm  Abdomen: round, soft, non tender, positive bowel sounds  Musculoskeletal: No focal spinal tenderness, no peripheral edema  Neuro: non focal, well oriented, positive affect  Breasts: right breast status post lumpectomy and radiation. No evidence of recurrent disease. Right axilla benign. Left breast unremarkable.   LAB RESULTS:  CMP     Component Value Date/Time   NA 142 03/01/2014 1419   NA 139 12/29/2013 1209   K 4.6 03/01/2014 1419   K 3.9 12/29/2013 1209   CL 100 12/29/2013 1209   CO2 29 03/01/2014 1419   CO2 25 12/29/2013 1209   GLUCOSE 185* 03/01/2014 1419   GLUCOSE 144* 12/29/2013 1209   BUN 18.1 03/01/2014 1419   BUN 19 12/29/2013 1209   CREATININE 1.3* 03/01/2014 1419   CREATININE 0.94 12/29/2013 1209   CREATININE 1.28* 05/06/2013 1149   CALCIUM 9.3 03/01/2014 1419   CALCIUM 9.4 12/29/2013 1209   CALCIUM 6.4* 05/08/2013 1215   PROT 7.0 03/01/2014 1419   PROT 6.7 11/04/2013 0513   ALBUMIN 3.6 03/01/2014 1419   ALBUMIN 3.4* 11/04/2013 0513   AST 13 03/01/2014 1419   AST 16 11/04/2013 0513   ALT 11 03/01/2014 1419   ALT 12 11/04/2013 0513   ALKPHOS 82 03/01/2014 1419   ALKPHOS 85 11/04/2013 0513   BILITOT 0.32 03/01/2014 1419   BILITOT 0.3 11/04/2013 0513   GFRNONAA 65* 12/29/2013 1209   GFRAA 75* 12/29/2013 1209    I No results found for: SPEP  Lab Results  Component Value Date   WBC 7.2 03/01/2014   NEUTROABS 4.8 03/01/2014   HGB 11.6 03/01/2014   HCT 35.4 03/01/2014   MCV 82.5 03/01/2014   PLT 226 03/01/2014      Chemistry      Component Value Date/Time   NA 142 03/01/2014 1419   NA 139 12/29/2013 1209   K 4.6 03/01/2014 1419   K 3.9 12/29/2013 1209   CL 100  12/29/2013 1209   CO2 29 03/01/2014 1419   CO2 25 12/29/2013 1209   BUN 18.1 03/01/2014 1419   BUN 19 12/29/2013 1209  CREATININE 1.3* 03/01/2014 1419   CREATININE 0.94 12/29/2013 1209   CREATININE 1.28* 05/06/2013 1149      Component Value Date/Time   CALCIUM 9.3 03/01/2014 1419   CALCIUM 9.4 12/29/2013 1209   CALCIUM 6.4* 05/08/2013 1215   ALKPHOS 82 03/01/2014 1419   ALKPHOS 85 11/04/2013 0513   AST 13 03/01/2014 1419   AST 16 11/04/2013 0513   ALT 11 03/01/2014 1419   ALT 12 11/04/2013 0513   BILITOT 0.32 03/01/2014 1419   BILITOT 0.3 11/04/2013 0513       No results found for: LABCA2  No components found for: LABCA125  No results for input(s): INR in the last 168 hours.  Urinalysis    Component Value Date/Time   COLORURINE YELLOW 09/06/2013 1040   APPEARANCEUR CLEAR 09/06/2013 1040   LABSPEC 1.015 09/06/2013 1040   PHURINE 5.0 09/06/2013 1040   GLUCOSEU NEGATIVE 09/06/2013 1040   HGBUR NEGATIVE 09/06/2013 1040   BILIRUBINUR NEGATIVE 09/06/2013 1040   KETONESUR NEGATIVE 09/06/2013 1040   PROTEINUR NEGATIVE 09/06/2013 1040   UROBILINOGEN 0.2 09/06/2013 1040   NITRITE NEGATIVE 09/06/2013 1040   LEUKOCYTESUR NEGATIVE 09/06/2013 1040    STUDIES: Most recent echocardiogram on 11/07/13 shows an ejection fraction of 55-60%  Most recent bone density scan on 01/18/13 showed a t-score of -0.3 (normal)  ASSESSMENT: 59 y.o. [place] woman   (1) status post right breast and right axillary lymph node biopsy 01/26/2013,  for a clinical T1c N1, stage IIA invasive ductal carcinoma, grade 3, estrogen and progesterone receptor positive, HER-2 amplified by immunohistochemistry (3+), with an MIB-1 of 70%.  (2) treated neo-adjuvantly with 4 of 6 planned cycles of docetaxel, carboplatin, trastuzumab,and pertuzumab, last dose 04/27/2013   (a) final 2 cycles of chemotherapy omitted in the face of multiple renal and electrolyte abnormalities    (b) pertuzumab and trastuzumab to  be continued until definitive surgery  (c) trastuzumab alone being continued to complete one year of anti-HER-2 treatment  (d) echocardiograms every 3 months while on trastuzumab; most recent study 07/11/2013  (3) status post right lumpectomy and sentinel lymph node sampling 07/19/2013 showing a complete pathologic response (ypT0 ypN0)  (4) adjuvant radiation completed on 11/29/13  (5) to begin anastrozole daily on 12/22/13  PLAN: Kateryna is doing well today. The labs were reviewed in detail and were relatively stable. She will proceed with trastuzumab today, but is overdue for an echocardiogram, so I placed orders for this to be performed within the next week. She will continue on the anastrozole daily. Judeth is also overdue on her mammogram, so this will be performed sometime this month.   She complains of right breast fullness/heaviness, so I have written her a prescription for a compression bra that may be of some help.  Dwana will return every 3 weeks for trastuzumab through April. She will have a follow up visit with that last infusion day. She understands and agrees with this plan. She knows the goal of treatment in her case is cure. She has been encouraged to call with any issues that might arise before her next visit here.   Marcelino Duster, NP

## 2014-03-10 ENCOUNTER — Telehealth: Payer: Self-pay | Admitting: Family Medicine

## 2014-03-10 ENCOUNTER — Other Ambulatory Visit: Payer: Self-pay | Admitting: Family Medicine

## 2014-03-10 NOTE — Telephone Encounter (Signed)
1) Pt states she isn't doing anything different, is taking meds as prescribed But the past few morning her sugars are spiking. Her fasting has been  Today 197, yesterday 210, day before 189   At lunch after breakfast was 329  She was recently on steroids but states she's been done with that for a bit now  Please advise patient  2) pt needs refill on her testing strips for her accuchek nano   [facility]

## 2014-03-10 NOTE — Telephone Encounter (Signed)
Patient notified and verbalized understanding. She will call back on Monday to schedule an appt with Dr Richardson Landry. She said she had to check on other appts to see what she had available.

## 2014-03-10 NOTE — Telephone Encounter (Signed)
#  1 it would be best for patient to adhere to a low starch diet if she is not doing so already. Minimize excessive breads, potatoes. Avoid sugary foods and sugary drinks. #2 may continue medicine as is #3 will need additional medication if her numbers are not coming down by next week #4 I would recommend this patient check her readings a proximally twice a day check in the morning and check some days before meals and some days 2 hours after meal. She should write these down on a sheet of paper and put the times next to it. In addition to this she should follow-up with Dr. Richardson Landry next week. If her numbers are not improving he will more than likely add long-acting insulin. Because her creatinine on recent lab work was 1.3 I would not increase the dose of metformin.

## 2014-03-10 NOTE — Telephone Encounter (Signed)
Dr. Jeannine Kitten pt. Was put on prednisone taper on 02/20/14.

## 2014-03-14 ENCOUNTER — Ambulatory Visit (HOSPITAL_COMMUNITY)
Admission: RE | Admit: 2014-03-14 | Discharge: 2014-03-14 | Disposition: A | Discharge status: Home / Self Care | Payer: Medicare Other | Source: Ambulatory Visit | Attending: Nurse Practitioner | Admitting: Nurse Practitioner

## 2014-03-14 ENCOUNTER — Encounter (HOSPITAL_COMMUNITY): Payer: Medicare Other

## 2014-03-14 DIAGNOSIS — C50411 Malignant neoplasm of upper-outer quadrant of right female breast: Secondary | ICD-10-CM

## 2014-03-15 ENCOUNTER — Ambulatory Visit: Payer: Medicare Other | Admitting: Nurse Practitioner

## 2014-03-15 ENCOUNTER — Other Ambulatory Visit: Payer: Medicare Other

## 2014-03-22 ENCOUNTER — Ambulatory Visit: Payer: Medicare Other

## 2014-03-22 ENCOUNTER — Ambulatory Visit (HOSPITAL_BASED_OUTPATIENT_CLINIC_OR_DEPARTMENT_OTHER): Payer: Medicare Other

## 2014-03-22 ENCOUNTER — Telehealth: Payer: Self-pay | Admitting: Nurse Practitioner

## 2014-03-22 ENCOUNTER — Telehealth: Payer: Self-pay | Admitting: *Deleted

## 2014-03-22 ENCOUNTER — Other Ambulatory Visit (HOSPITAL_BASED_OUTPATIENT_CLINIC_OR_DEPARTMENT_OTHER): Payer: Medicare Other

## 2014-03-22 DIAGNOSIS — C50411 Malignant neoplasm of upper-outer quadrant of right female breast: Secondary | ICD-10-CM

## 2014-03-22 DIAGNOSIS — Z5112 Encounter for antineoplastic immunotherapy: Secondary | ICD-10-CM | POA: Diagnosis not present

## 2014-03-22 LAB — CBC WITH DIFFERENTIAL/PLATELET
BASO%: 0.2 % (ref 0.0–2.0)
Basophils Absolute: 0 10*3/uL (ref 0.0–0.1)
EOS%: 3.5 % (ref 0.0–7.0)
Eosinophils Absolute: 0.2 10*3/uL (ref 0.0–0.5)
HCT: 33.4 % — ABNORMAL LOW (ref 34.8–46.6)
HGB: 10.8 g/dL — ABNORMAL LOW (ref 11.6–15.9)
LYMPH#: 1.1 10*3/uL (ref 0.9–3.3)
LYMPH%: 24 % (ref 14.0–49.7)
MCH: 26.9 pg (ref 25.1–34.0)
MCHC: 32.3 g/dL (ref 31.5–36.0)
MCV: 83.3 fL (ref 79.5–101.0)
MONO#: 0.4 10*3/uL (ref 0.1–0.9)
MONO%: 8.9 % (ref 0.0–14.0)
NEUT%: 63.4 % (ref 38.4–76.8)
NEUTROS ABS: 2.9 10*3/uL (ref 1.5–6.5)
Platelets: 222 10*3/uL (ref 145–400)
RBC: 4.01 10*6/uL (ref 3.70–5.45)
RDW: 15.7 % — AB (ref 11.2–14.5)
WBC: 4.6 10*3/uL (ref 3.9–10.3)

## 2014-03-22 LAB — COMPREHENSIVE METABOLIC PANEL (CC13)
ALBUMIN: 3.5 g/dL (ref 3.5–5.0)
ALT: 9 U/L (ref 0–55)
AST: 14 U/L (ref 5–34)
Alkaline Phosphatase: 75 U/L (ref 40–150)
Anion Gap: 11 mEq/L (ref 3–11)
BILIRUBIN TOTAL: 0.26 mg/dL (ref 0.20–1.20)
BUN: 16.7 mg/dL (ref 7.0–26.0)
CO2: 25 mEq/L (ref 22–29)
Calcium: 9.2 mg/dL (ref 8.4–10.4)
Chloride: 107 mEq/L (ref 98–109)
Creatinine: 1.2 mg/dL — ABNORMAL HIGH (ref 0.6–1.1)
EGFR: 58 mL/min/{1.73_m2} — ABNORMAL LOW (ref >90)
Glucose: 223 mg/dl — ABNORMAL HIGH (ref 70–140)
Potassium: 4.4 mEq/L (ref 3.5–5.1)
Sodium: 143 mEq/L (ref 136–145)
TOTAL PROTEIN: 7 g/dL (ref 6.4–8.3)

## 2014-03-22 MED ORDER — ACETAMINOPHEN 325 MG PO TABS
ORAL_TABLET | ORAL | Status: AC
  Filled 2014-03-22: qty 2

## 2014-03-22 MED ORDER — SODIUM CHLORIDE 0.9 % IV SOLN
Freq: Once | INTRAVENOUS | Status: AC
  Administered 2014-03-22: 15:00:00 via INTRAVENOUS

## 2014-03-22 MED ORDER — ACETAMINOPHEN 325 MG PO TABS: 650.0000 mg | ORAL_TABLET | Freq: Once | ORAL | Status: DC

## 2014-03-22 MED ORDER — DIPHENHYDRAMINE HCL 25 MG PO CAPS
25.0000 mg | ORAL_CAPSULE | Freq: Once | ORAL | Status: AC
  Administered 2014-03-22: 25 mg via ORAL

## 2014-03-22 MED ORDER — HEPARIN SOD (PORK) LOCK FLUSH 100 UNIT/ML IV SOLN
500.0000 [IU] | Freq: Once | INTRAVENOUS | Status: AC | PRN
  Administered 2014-03-22: 500 [IU]
  Filled 2014-03-22: qty 5

## 2014-03-22 MED ORDER — TRASTUZUMAB CHEMO INJECTION 440 MG
6.0000 mg/kg | Freq: Once | INTRAVENOUS | Status: AC
  Administered 2014-03-22: 714 mg via INTRAVENOUS
  Filled 2014-03-22: qty 34

## 2014-03-22 MED ORDER — DIPHENHYDRAMINE HCL 25 MG PO CAPS
ORAL_CAPSULE | ORAL | Status: AC
  Filled 2014-03-22: qty 2

## 2014-03-22 MED ORDER — SODIUM CHLORIDE 0.9 % IJ SOLN
10.0000 mL | INTRAMUSCULAR | Status: DC | PRN
  Administered 2014-03-22: 10 mL
  Filled 2014-03-22: qty 10

## 2014-03-22 NOTE — Patient Instructions (Signed)
[facility] Discharge Instructions for Patients Receiving Chemotherapy  Today you received the following chemotherapy agents Herceptin.  To help prevent nausea and vomiting after your treatment, we encourage you to take your nausea medication as prescribed.   If you develop nausea and vomiting that is not controlled by your nausea medication, call the clinic.   BELOW ARE SYMPTOMS THAT SHOULD BE REPORTED IMMEDIATELY:  *FEVER GREATER THAN 100.5 F  *CHILLS WITH OR WITHOUT FEVER  NAUSEA AND VOMITING THAT IS NOT CONTROLLED WITH YOUR NAUSEA MEDICATION  *UNUSUAL SHORTNESS OF BREATH  *UNUSUAL BRUISING OR BLEEDING  TENDERNESS IN MOUTH AND THROAT WITH OR WITHOUT PRESENCE OF ULCERS  *URINARY PROBLEMS  *BOWEL PROBLEMS  UNUSUAL RASH Items with * indicate a potential emergency and should be followed up as soon as possible.  Feel free to call the clinic you have any questions or concerns. The clinic phone number is [phone].    

## 2014-03-22 NOTE — Telephone Encounter (Signed)
Per staff message and POF I have scheduled appts. Advised scheduler of appts. JMW  

## 2014-03-22 NOTE — Telephone Encounter (Signed)
per pof to sch pt ECHO-per Roena Malady #[id number] pt and gave pt time/date.location

## 2014-03-22 NOTE — Telephone Encounter (Signed)
per pof to sch pt appt-sent [facility] email prev-pt appts never sch-pt will come back today @2 :30

## 2014-03-31 ENCOUNTER — Ambulatory Visit (HOSPITAL_COMMUNITY)
Admission: RE | Admit: 2014-03-31 | Discharge: 2014-03-31 | Disposition: A | Discharge status: Home / Self Care | Payer: Medicare Other | Source: Ambulatory Visit | Attending: Nurse Practitioner | Admitting: Nurse Practitioner

## 2014-03-31 DIAGNOSIS — C50411 Malignant neoplasm of upper-outer quadrant of right female breast: Secondary | ICD-10-CM | POA: Diagnosis present

## 2014-03-31 DIAGNOSIS — Z5111 Encounter for antineoplastic chemotherapy: Secondary | ICD-10-CM

## 2014-03-31 NOTE — Progress Notes (Signed)
  Echocardiogram 2D Echocardiogram has been performed.  [name] 03/31/2014, 9:50 AM

## 2014-04-04 ENCOUNTER — Ambulatory Visit (INDEPENDENT_AMBULATORY_CARE_PROVIDER_SITE_OTHER): Payer: Medicare Other | Admitting: Pulmonary Disease

## 2014-04-04 ENCOUNTER — Encounter: Payer: Self-pay | Admitting: Pulmonary Disease

## 2014-04-04 VITALS — BP 140/70 | HR 89 | Ht 64.5 in | Wt 268.4 lb

## 2014-04-04 DIAGNOSIS — G4733 Obstructive sleep apnea (adult) (pediatric): Secondary | ICD-10-CM | POA: Diagnosis not present

## 2014-04-04 NOTE — Progress Notes (Signed)
Subjective:    Patient ID: [name], female    DOB: May 06, 1955, 59 y.o.   MRN: [id number]  HPI  Chief Complaint  Patient presents with  . Advice Only    Referred by Dr. Haroldine Laws; Cancer patient - was put to sleep in the hospital and was sent for sleep consult because during hospital visit, she was not getting enough oxygen while sleeping and she was snoring; ONO done 12/13/13 Epworth Score: 72   59 year old referred for evaluation of sleep-disordered breathing. She has a recent diagnosis of breast cancer requiring lumpectomy, followed by Herceptin. She had an episode of syncope, was found to have atrial fibrillation and required DC CV with return to normal sinus rhythm. Nocturnal oximetry 11/2013 showed lowest desaturation of 78%, with total desaturation of 35 minutes less than 88%. There was a sawtooth pattern. Epworth sleepiness score is 2 Bedtime is close to 10 PM, sleep latency is up to 45 minutes, she is to sleep with 3 pillows, now sleeps with a wedge which she loves, reports 3-4 nocturnal awakenings including nocturia and is out of bed by 8 AM with dryness of mouth, denies headaches There is no history suggestive of cataplexy, sleep paralysis or parasomnias  She smoked about half pack per day for 20 years and is currently disabled  Past Medical History  Diagnosis Date  . Hyperlipidemia   . Obesity   . Asthma     prn neb. and inhaler  . Osteoarthritis 2003    left knee  . Non-insulin dependent type 2 diabetes mellitus   . Graves' disease   . Ophthalmic manifestation of Graves disease   . Hypertension     on multiple meds., has been on med. > 14 yr.  . Dehydration 04/13/2013  . Breast cancer 01/2013    right  . Wears glasses   . GERD (gastroesophageal reflux disease)   . Radiation 10/11/13-11/29/13    Right Breast    Past Surgical History  Procedure Laterality Date  . Total knee arthroplasty Left 2003  . Total thyroidectomy    . Colonoscopy  2007  . Tubal  ligation  1985  . Colonoscopy w/ polypectomy  2009  . Portacath placement Right 02/17/2013    Procedure: INSERTION PORT-A-CATH;  Surgeon: Joyice Faster. Cornett, MD;  Location: [facility];  Service: General;  Laterality: Right;  . Tee without cardioversion N/A 11/08/2013    Procedure: TRANSESOPHAGEAL ECHOCARDIOGRAM (TEE);  Surgeon: Lelon Perla, MD;  Location: [facility] ENDOSCOPY;  Service: Cardiovascular;  Laterality: N/A;  . Cardioversion N/A 11/08/2013    Procedure: CARDIOVERSION;  Surgeon: Lelon Perla, MD;  Location: [facility] ENDOSCOPY;  Service: Cardiovascular;  Laterality: N/A;    Allergies  Allergen Reactions  . Procardia [Nifedipine] Other (See Comments)    MIGRAINES  . Aspirin     Unable to take due to blood pressure medication  . Diltiazem Itching    History   Social History  . Marital Status: Divorced    Spouse Name: N/A  . Number of Children: 2  . Years of Education: N/A   Occupational History  . Disability    Social History Main Topics  . Smoking status: Former Smoker -- 0.25 packs/day for 20 years    Quit date: 02/10/1991  . Smokeless tobacco: Never Used  . Alcohol Use: No  . Drug Use: No  . Sexual Activity: No   Other Topics Concern  . Not on file   Social History Narrative   Lives  in [place]          Family History  Problem Relation Age of Onset  . Heart disease Father   . Lung cancer Father   . Diabetes Mother   . Diabetes Sister   . Hypertension Sister   . Asthma Sister     Review of Systems  Constitutional: Negative for fever, chills and unexpected weight change.  HENT: Negative for congestion, dental problem, ear pain, nosebleeds, postnasal drip, rhinorrhea, sinus pressure, sneezing, sore throat, trouble swallowing and voice change.   Eyes: Negative for visual disturbance.  Respiratory: Negative for cough, choking and shortness of breath.   Cardiovascular: Negative for chest pain and leg swelling.  Gastrointestinal: Negative  for vomiting, abdominal pain and diarrhea.  Genitourinary: Negative for difficulty urinating.  Musculoskeletal: Negative for arthralgias.  Skin: Negative for rash.  Neurological: Negative for tremors, syncope and headaches.  Hematological: Does not bruise/bleed easily.       Objective:   Physical Exam  Gen. Pleasant, obese, in no distress, normal affect ENT - no lesions, no post nasal drip, class 2-3 airway Neck: No JVD, no thyromegaly, no carotid bruits Lungs: no use of accessory muscles, no dullness to percussion, decreased without rales or rhonchi  Cardiovascular: Rhythm regular, heart sounds  normal, no murmurs or gallops, no peripheral edema Abdomen: soft and non-tender, no hepatosplenomegaly, BS normal. Musculoskeletal: No deformities, no cyanosis or clubbing Neuro:  alert, non focal, no tremors       Assessment & Plan:

## 2014-04-04 NOTE — Patient Instructions (Signed)
You may have obstructive sleep apnea  Home sleep study

## 2014-04-04 NOTE — Assessment & Plan Note (Signed)
Given excessive daytime somnolence, narrow pharyngeal exam, witnessed apneas & loud snoring, obstructive sleep apnea is very likely & an overnight polysomnogram will be scheduled as a home study. The pathophysiology of obstructive sleep apnea , it's cardiovascular consequences & modes of treatment including CPAP were discused with the patient in detail & they evidenced understanding.  

## 2014-04-12 ENCOUNTER — Ambulatory Visit (HOSPITAL_BASED_OUTPATIENT_CLINIC_OR_DEPARTMENT_OTHER): Payer: Medicare Other

## 2014-04-12 ENCOUNTER — Other Ambulatory Visit: Payer: Self-pay | Admitting: Oncology

## 2014-04-12 ENCOUNTER — Other Ambulatory Visit (HOSPITAL_BASED_OUTPATIENT_CLINIC_OR_DEPARTMENT_OTHER): Payer: Medicare Other

## 2014-04-12 DIAGNOSIS — C50411 Malignant neoplasm of upper-outer quadrant of right female breast: Secondary | ICD-10-CM

## 2014-04-12 DIAGNOSIS — Z5112 Encounter for antineoplastic immunotherapy: Secondary | ICD-10-CM

## 2014-04-12 LAB — CBC WITH DIFFERENTIAL/PLATELET
BASO%: 0.6 % (ref 0.0–2.0)
Basophils Absolute: 0 10*3/uL (ref 0.0–0.1)
EOS ABS: 0.2 10*3/uL (ref 0.0–0.5)
EOS%: 4.9 % (ref 0.0–7.0)
HEMATOCRIT: 34.2 % — AB (ref 34.8–46.6)
HGB: 10.8 g/dL — ABNORMAL LOW (ref 11.6–15.9)
LYMPH%: 26.4 % (ref 14.0–49.7)
MCH: 26.4 pg (ref 25.1–34.0)
MCHC: 31.6 g/dL (ref 31.5–36.0)
MCV: 83.5 fL (ref 79.5–101.0)
MONO#: 0.4 10*3/uL (ref 0.1–0.9)
MONO%: 8.7 % (ref 0.0–14.0)
NEUT#: 2.4 10*3/uL (ref 1.5–6.5)
NEUT%: 59.4 % (ref 38.4–76.8)
PLATELETS: 208 10*3/uL (ref 145–400)
RBC: 4.1 10*6/uL (ref 3.70–5.45)
RDW: 17.2 % — ABNORMAL HIGH (ref 11.2–14.5)
WBC: 4.1 10*3/uL (ref 3.9–10.3)
lymph#: 1.1 10*3/uL (ref 0.9–3.3)

## 2014-04-12 LAB — COMPREHENSIVE METABOLIC PANEL (CC13)
ALT: 17 U/L (ref 0–55)
ANION GAP: 14 meq/L — AB (ref 3–11)
AST: 19 U/L (ref 5–34)
Albumin: 3.6 g/dL (ref 3.5–5.0)
Alkaline Phosphatase: 77 U/L (ref 40–150)
BUN: 15.3 mg/dL (ref 7.0–26.0)
CO2: 27 meq/L (ref 22–29)
CREATININE: 1.2 mg/dL — AB (ref 0.6–1.1)
Calcium: 8.8 mg/dL (ref 8.4–10.4)
Chloride: 105 mEq/L (ref 98–109)
EGFR: 56 mL/min/{1.73_m2} — AB (ref >90)
Glucose: 178 mg/dl — ABNORMAL HIGH (ref 70–140)
Potassium: 4.2 mEq/L (ref 3.5–5.1)
SODIUM: 146 meq/L — AB (ref 136–145)
Total Bilirubin: 0.43 mg/dL (ref 0.20–1.20)
Total Protein: 6.8 g/dL (ref 6.4–8.3)

## 2014-04-12 MED ORDER — TRASTUZUMAB CHEMO INJECTION 440 MG
6.0000 mg/kg | Freq: Once | INTRAVENOUS | Status: AC
  Administered 2014-04-12: 714 mg via INTRAVENOUS
  Filled 2014-04-12: qty 34

## 2014-04-12 MED ORDER — SODIUM CHLORIDE 0.9 % IV SOLN
Freq: Once | INTRAVENOUS | Status: AC
  Administered 2014-04-12: 12:00:00 via INTRAVENOUS

## 2014-04-12 MED ORDER — ACETAMINOPHEN 325 MG PO TABS
650.0000 mg | ORAL_TABLET | Freq: Once | ORAL | Status: AC
  Administered 2014-04-12: 650 mg via ORAL

## 2014-04-12 MED ORDER — ACETAMINOPHEN 325 MG PO TABS
ORAL_TABLET | ORAL | Status: AC
  Filled 2014-04-12: qty 2

## 2014-04-12 MED ORDER — DIPHENHYDRAMINE HCL 25 MG PO CAPS
ORAL_CAPSULE | ORAL | Status: AC
  Filled 2014-04-12: qty 2

## 2014-04-12 MED ORDER — HEPARIN SOD (PORK) LOCK FLUSH 100 UNIT/ML IV SOLN
500.0000 [IU] | Freq: Once | INTRAVENOUS | Status: AC | PRN
  Administered 2014-04-12: 500 [IU]
  Filled 2014-04-12: qty 5

## 2014-04-12 MED ORDER — SODIUM CHLORIDE 0.9 % IJ SOLN
10.0000 mL | INTRAMUSCULAR | Status: DC | PRN
  Administered 2014-04-12: 10 mL
  Filled 2014-04-12: qty 10

## 2014-04-12 MED ORDER — DIPHENHYDRAMINE HCL 25 MG PO CAPS
25.0000 mg | ORAL_CAPSULE | Freq: Once | ORAL | Status: AC
  Administered 2014-04-12: 25 mg via ORAL

## 2014-04-12 NOTE — Patient Instructions (Signed)
[facility] Discharge Instructions for Patients Receiving Chemotherapy  Today you received the following chemotherapy agents: Herceptin   To help prevent nausea and vomiting after your treatment, we encourage you to take your nausea medication as directed.    If you develop nausea and vomiting that is not controlled by your nausea medication, call the clinic.   BELOW ARE SYMPTOMS THAT SHOULD BE REPORTED IMMEDIATELY:  *FEVER GREATER THAN 100.5 F  *CHILLS WITH OR WITHOUT FEVER  NAUSEA AND VOMITING THAT IS NOT CONTROLLED WITH YOUR NAUSEA MEDICATION  *UNUSUAL SHORTNESS OF BREATH  *UNUSUAL BRUISING OR BLEEDING  TENDERNESS IN MOUTH AND THROAT WITH OR WITHOUT PRESENCE OF ULCERS  *URINARY PROBLEMS  *BOWEL PROBLEMS  UNUSUAL RASH Items with * indicate a potential emergency and should be followed up as soon as possible.  Feel free to call the clinic you have any questions or concerns. The clinic phone number is [phone].  Please show the CHEMO ALERT CARD at check-in to the Emergency Department and triage nurse.   

## 2014-04-13 ENCOUNTER — Telehealth: Payer: Self-pay | Admitting: Pulmonary Disease

## 2014-04-13 NOTE — Telephone Encounter (Signed)
Call bcbs carla not available left info with another case worker who will have her call me back Joellen Jersey

## 2014-04-14 DIAGNOSIS — G4730 Sleep apnea, unspecified: Secondary | ICD-10-CM

## 2014-04-14 HISTORY — DX: Sleep apnea, unspecified: G47.30

## 2014-04-14 NOTE — Telephone Encounter (Signed)
Auth# [id number] 04/10/14 to 06/09/14 Joellen Jersey

## 2014-04-26 ENCOUNTER — Encounter: Payer: Self-pay | Admitting: Family Medicine

## 2014-04-26 ENCOUNTER — Ambulatory Visit (INDEPENDENT_AMBULATORY_CARE_PROVIDER_SITE_OTHER): Payer: Medicare Other | Admitting: Family Medicine

## 2014-04-26 VITALS — BP 140/88 | Ht 64.5 in | Wt 267.1 lb

## 2014-04-26 DIAGNOSIS — I10 Essential (primary) hypertension: Secondary | ICD-10-CM

## 2014-04-26 DIAGNOSIS — E119 Type 2 diabetes mellitus without complications: Secondary | ICD-10-CM | POA: Diagnosis not present

## 2014-04-26 DIAGNOSIS — E039 Hypothyroidism, unspecified: Secondary | ICD-10-CM | POA: Diagnosis not present

## 2014-04-26 DIAGNOSIS — Z79899 Other long term (current) drug therapy: Secondary | ICD-10-CM

## 2014-04-26 DIAGNOSIS — E785 Hyperlipidemia, unspecified: Secondary | ICD-10-CM | POA: Diagnosis not present

## 2014-04-26 LAB — POCT GLYCOSYLATED HEMOGLOBIN (HGB A1C): Hemoglobin A1C: 8.4

## 2014-04-26 MED ORDER — LORATADINE 10 MG PO TABS: 10.0000 mg | ORAL_TABLET | Freq: Every day | ORAL | Status: DC

## 2014-04-26 NOTE — Progress Notes (Signed)
   Subjective:    Patient ID: [name], female    DOB: 09-05-1955, 59 y.o.   MRN: [id number]  Diabetes She presents for her follow-up diabetic visit. She has type 2 diabetes mellitus. Her disease course has been stable. There are no hypoglycemic associated symptoms. There are no diabetic associated symptoms. There are no hypoglycemic complications. Symptoms are stable. There are no diabetic complications. There are no known risk factors for coronary artery disease. Current diabetic treatment includes oral agent (monotherapy). She is compliant with treatment all of the time.   Results for orders placed or performed in visit on 04/26/14  POCT glycosylated hemoglobin (Hb A1C)  Result Value Ref Range   Hemoglobin A1C 8.4    Patient states that she has a cough but she believes it is related to her allergies. 134 82  Sugars running ok lately. They were up but now they are better. Tolerating medication okay. Relates prior elevated sugars to therapy for breast cancer  Chol med is on the 40 mg, but takes one half of the 80 mg. Working hard on cholesterol and diet. Exact status uncertain.  Compliant with thyroid medicine. No excessive fatigue. States energy level is decent.  Takes b p med refgulaly. Tolerating medication well. Watching salt intake.  Review of Systems    no headache no chest pain no back pain abdominal pain no change in bowel habits Objective:   Physical Exam  Alert no acute distress H&T normal blood pressure improved on repeat lungs clear heart rare rhythm ankles without edema      Assessment & Plan:  Impression 1 hypertension good control #2 hyperlipidemia status uncertain #3 hypothyroidism status uncertain #4 type 2 diabetes suboptimum control patient states though improving and would like to not change medications. Plan I discussed exercise discussed. Appropriate blood work. Recheck in several months. WSL

## 2014-04-27 ENCOUNTER — Telehealth: Payer: Self-pay | Admitting: Family Medicine

## 2014-04-27 ENCOUNTER — Other Ambulatory Visit: Payer: Self-pay | Admitting: Family Medicine

## 2014-04-27 ENCOUNTER — Other Ambulatory Visit: Payer: Self-pay | Admitting: *Deleted

## 2014-04-27 LAB — LIPID PANEL
CHOL/HDL RATIO: 2.2 ratio (ref 0.0–4.4)
Cholesterol, Total: 152 mg/dL (ref 100–199)
HDL: 68 mg/dL (ref >39)
LDL CALC: 67 mg/dL (ref 0–99)
Triglycerides: 84 mg/dL (ref 0–149)
VLDL CHOLESTEROL CAL: 17 mg/dL (ref 5–40)

## 2014-04-27 LAB — HEPATIC FUNCTION PANEL
ALBUMIN: 4.5 g/dL (ref 3.5–5.5)
ALK PHOS: 91 IU/L (ref 39–117)
ALT: 11 IU/L (ref 0–32)
AST: 22 IU/L (ref 0–40)
Bilirubin Total: 0.4 mg/dL (ref 0.0–1.2)
Bilirubin, Direct: 0.14 mg/dL (ref 0.00–0.40)
Total Protein: 7.1 g/dL (ref 6.0–8.5)

## 2014-04-27 LAB — BASIC METABOLIC PANEL
BUN / CREAT RATIO: 16 (ref 9–23)
BUN: 17 mg/dL (ref 6–24)
CO2: 25 mmol/L (ref 18–29)
CREATININE: 1.08 mg/dL — AB (ref 0.57–1.00)
Calcium: 9 mg/dL (ref 8.7–10.2)
Chloride: 102 mmol/L (ref 97–108)
GFR calc non Af Amer: 56 mL/min/{1.73_m2} — ABNORMAL LOW (ref >59)
GFR, EST AFRICAN AMERICAN: 65 mL/min/{1.73_m2} (ref >59)
Glucose: 136 mg/dL — ABNORMAL HIGH (ref 65–99)
Potassium: 4.1 mmol/L (ref 3.5–5.2)
SODIUM: 145 mmol/L — AB (ref 134–144)

## 2014-04-27 LAB — TSH: TSH: 0.36 u[IU]/mL — ABNORMAL LOW (ref 0.450–4.500)

## 2014-04-27 LAB — MICROALBUMIN, URINE: MICROALBUM., U, RANDOM: 5.1 ug/mL (ref 0.0–17.0)

## 2014-04-27 MED ORDER — LORATADINE 10 MG PO TABS: 10.0000 mg | ORAL_TABLET | Freq: Every day | ORAL | Status: DC

## 2014-04-27 NOTE — Telephone Encounter (Signed)
Pt is needing her claritin sent back over to [facility] they  Have not received it. Pt was seen yesterday.

## 2014-04-27 NOTE — Telephone Encounter (Signed)
Med resent to pharm. Pt notified.  

## 2014-04-30 ENCOUNTER — Encounter: Payer: Self-pay | Admitting: Family Medicine

## 2014-05-01 DIAGNOSIS — G4730 Sleep apnea, unspecified: Secondary | ICD-10-CM | POA: Diagnosis not present

## 2014-05-03 ENCOUNTER — Ambulatory Visit (HOSPITAL_BASED_OUTPATIENT_CLINIC_OR_DEPARTMENT_OTHER): Payer: Medicare Other

## 2014-05-03 ENCOUNTER — Other Ambulatory Visit: Payer: Medicare Other

## 2014-05-03 ENCOUNTER — Telehealth: Payer: Self-pay | Admitting: Nurse Practitioner

## 2014-05-03 ENCOUNTER — Encounter: Payer: Self-pay | Admitting: Nurse Practitioner

## 2014-05-03 ENCOUNTER — Ambulatory Visit (HOSPITAL_BASED_OUTPATIENT_CLINIC_OR_DEPARTMENT_OTHER): Payer: Medicare Other | Admitting: Nurse Practitioner

## 2014-05-03 ENCOUNTER — Other Ambulatory Visit (HOSPITAL_BASED_OUTPATIENT_CLINIC_OR_DEPARTMENT_OTHER): Payer: Medicare Other

## 2014-05-03 VITALS — BP 148/71 | HR 84 | Temp 98.0°F | Resp 18 | Ht 64.5 in | Wt 267.3 lb

## 2014-05-03 DIAGNOSIS — Z5112 Encounter for antineoplastic immunotherapy: Secondary | ICD-10-CM | POA: Diagnosis not present

## 2014-05-03 DIAGNOSIS — C50411 Malignant neoplasm of upper-outer quadrant of right female breast: Secondary | ICD-10-CM

## 2014-05-03 DIAGNOSIS — Z170 Estrogen receptor positive status [ER+]: Secondary | ICD-10-CM | POA: Diagnosis not present

## 2014-05-03 DIAGNOSIS — C773 Secondary and unspecified malignant neoplasm of axilla and upper limb lymph nodes: Secondary | ICD-10-CM

## 2014-05-03 LAB — COMPREHENSIVE METABOLIC PANEL (CC13)
ALT: 15 U/L (ref 0–55)
AST: 17 U/L (ref 5–34)
Albumin: 3.7 g/dL (ref 3.5–5.0)
Alkaline Phosphatase: 76 U/L (ref 40–150)
Anion Gap: 15 mEq/L — ABNORMAL HIGH (ref 3–11)
BILIRUBIN TOTAL: 0.24 mg/dL (ref 0.20–1.20)
BUN: 18.6 mg/dL (ref 7.0–26.0)
CALCIUM: 8.9 mg/dL (ref 8.4–10.4)
CO2: 21 mEq/L — ABNORMAL LOW (ref 22–29)
CREATININE: 1.2 mg/dL — AB (ref 0.6–1.1)
Chloride: 108 mEq/L (ref 98–109)
EGFR: 58 mL/min/{1.73_m2} — ABNORMAL LOW (ref >90)
Glucose: 161 mg/dl — ABNORMAL HIGH (ref 70–140)
Potassium: 4.1 mEq/L (ref 3.5–5.1)
Sodium: 144 mEq/L (ref 136–145)
Total Protein: 7 g/dL (ref 6.4–8.3)

## 2014-05-03 LAB — CBC WITH DIFFERENTIAL/PLATELET
BASO%: 0.2 % (ref 0.0–2.0)
Basophils Absolute: 0 10*3/uL (ref 0.0–0.1)
EOS%: 2.3 % (ref 0.0–7.0)
Eosinophils Absolute: 0.1 10*3/uL (ref 0.0–0.5)
HCT: 34.3 % — ABNORMAL LOW (ref 34.8–46.6)
HGB: 11.2 g/dL — ABNORMAL LOW (ref 11.6–15.9)
LYMPH#: 1.3 10*3/uL (ref 0.9–3.3)
LYMPH%: 26 % (ref 14.0–49.7)
MCH: 27.3 pg (ref 25.1–34.0)
MCHC: 32.7 g/dL (ref 31.5–36.0)
MCV: 83.7 fL (ref 79.5–101.0)
MONO#: 0.4 10*3/uL (ref 0.1–0.9)
MONO%: 7.2 % (ref 0.0–14.0)
NEUT#: 3.1 10*3/uL (ref 1.5–6.5)
NEUT%: 64.3 % (ref 38.4–76.8)
Platelets: 191 10*3/uL (ref 145–400)
RBC: 4.1 10*6/uL (ref 3.70–5.45)
RDW: 16 % — AB (ref 11.2–14.5)
WBC: 4.8 10*3/uL (ref 3.9–10.3)

## 2014-05-03 MED ORDER — DIPHENHYDRAMINE HCL 25 MG PO CAPS
ORAL_CAPSULE | ORAL | Status: AC
  Filled 2014-05-03: qty 1

## 2014-05-03 MED ORDER — TRASTUZUMAB CHEMO INJECTION 440 MG
6.0000 mg/kg | Freq: Once | INTRAVENOUS | Status: AC
  Administered 2014-05-03: 714 mg via INTRAVENOUS
  Filled 2014-05-03: qty 34

## 2014-05-03 MED ORDER — ACETAMINOPHEN 325 MG PO TABS
650.0000 mg | ORAL_TABLET | Freq: Once | ORAL | Status: AC
  Administered 2014-05-03: 650 mg via ORAL

## 2014-05-03 MED ORDER — SODIUM CHLORIDE 0.9 % IJ SOLN
10.0000 mL | INTRAMUSCULAR | Status: DC | PRN
  Administered 2014-05-03: 10 mL
  Filled 2014-05-03: qty 10

## 2014-05-03 MED ORDER — DIPHENHYDRAMINE HCL 25 MG PO CAPS
25.0000 mg | ORAL_CAPSULE | Freq: Once | ORAL | Status: AC
  Administered 2014-05-03: 25 mg via ORAL

## 2014-05-03 MED ORDER — SODIUM CHLORIDE 0.9 % IV SOLN
Freq: Once | INTRAVENOUS | Status: AC
  Administered 2014-05-03: 11:00:00 via INTRAVENOUS

## 2014-05-03 MED ORDER — HEPARIN SOD (PORK) LOCK FLUSH 100 UNIT/ML IV SOLN
500.0000 [IU] | Freq: Once | INTRAVENOUS | Status: AC | PRN
  Administered 2014-05-03: 500 [IU]
  Filled 2014-05-03: qty 5

## 2014-05-03 MED ORDER — ACETAMINOPHEN 325 MG PO TABS
ORAL_TABLET | ORAL | Status: AC
  Filled 2014-05-03: qty 2

## 2014-05-03 NOTE — Telephone Encounter (Signed)
per pof to sch tp appt-sent emai to [facility] to sch port removal-adv pt [facility] will call to sch--gave pt copy of sch

## 2014-05-03 NOTE — Progress Notes (Signed)
[facility]  [phone]) [phone] [phone]) [phone]    ID: [name] OB: 01-27-1955  MR#: [id number]  PTW#:[id number]  PCP: Rubbie Battiest, MD GYN:   SU: Dr. Erroll Luna OTHER MD: Dr. Thea Silversmith  CHIEF COMPLAINT:  Locally advanced breast cancer TREATMENT: Receiving anti-HER-2 immunotherapy; to begin anti-estrogen therapy  BREAST CANCER HISTORY: From Dr Dana Allan original intake note:  The patient herself noted a change in her right breast November 2014, but did not tell her family until after Christmas at the year. They "pretty much forced me" to have a mammogram and bilateral diagnostic mammography and right ultrasound 01/19/2013 at the breast Center showed an irregular mass associated with pleomorphic calcifications in the upper-outer quadrant of the right breast measuring 1.6 cm. There was an abnormal appearing right axillary lymph node. On physical exam, there was a movable firm palpable mass in the right breast measuring approximately 2 cm by palpation. Ultrasound showed this to be an irregularly marginated hypoechoic mass measuring 1.8 cm. In the right axilla the ultrasound showed a 1.3 cm abnormal appearing level I lymph node (loss a fatty hilum).  The 01/26/2013 the patient underwent biopsy of both the right breast mass in question and a suspicious right axillary lymph node. This showed ([id number]) both the breast mass and axillary lymph node 2 be positive for invasive ductal carcinoma, grade 2 or 3, estrogen receptor 99% positive, progesterone receptor 95% positive, both with strong staining intensity, with an MIB-1 of 70%, and HER-2 amplified at 3+.The patient was unable to undergo MRI because of claustrophobia concerns.   Her subsequent history is as detailed below.  INTERVAL HISTORY: [name] returns to clinic today for follow up of her breast cancer, accompanied by her daughter [name]. She is due for trastuzumab today.  She has been on anastrozole  since December 2015 and continues to tolerate it well. She only has 2-3 hot flashes monthly, and denies vaginal changes. She has a baseline history of knee pain, but this is no worse than previously documented. She uses a rolling walker for steadiness.  REVIEW OF SYSTEMS: [name] denies fevers, chills, nausea, vomiting, or changes in bowel or bladder habits. The numbness to her fingertips is stable and she is taking 163m gabapentin TID. She has some shortness of breath with exertion and also believes she has sleep apnea. [facility] for a sleep study soon. She will possibly be fit for a CPAP. She has no chest pain, palpitations, or cough. She has some mild fatigue.  A detailed review of systems is otherwise negative.  PAST MEDICAL HISTORY: Past Medical History  Diagnosis Date  . Hyperlipidemia   . Obesity   . Asthma     prn neb. and inhaler  . Osteoarthritis 2003    left knee  . Non-insulin dependent type 2 diabetes mellitus   . Graves' disease   . Ophthalmic manifestation of Graves disease   . Hypertension     on multiple meds., has been on med. > 14 yr.  . Dehydration 04/13/2013  . Breast cancer 01/2013    right  . Wears glasses   . GERD (gastroesophageal reflux disease)   . Radiation 10/11/13-11/29/13    Right Breast    PAST SURGICAL HISTORY: Past Surgical History  Procedure Laterality Date  . Total knee arthroplasty Left 2003  . Total thyroidectomy    . Colonoscopy  2007  . Tubal ligation  1985  . Colonoscopy  w/ polypectomy  2009  . Portacath placement Right 02/17/2013    Procedure: INSERTION PORT-A-CATH;  Surgeon: Joyice Faster. Cornett, MD;  Location: [facility];  Service: General;  Laterality: Right;  . Tee without cardioversion N/A 11/08/2013    Procedure: TRANSESOPHAGEAL ECHOCARDIOGRAM (TEE);  Surgeon: Lelon Perla, MD;  Location: [facility] ENDOSCOPY;  Service: Cardiovascular;  Laterality: N/A;  . Cardioversion  N/A 11/08/2013    Procedure: CARDIOVERSION;  Surgeon: Lelon Perla, MD;  Location: [facility] ENDOSCOPY;  Service: Cardiovascular;  Laterality: N/A;    FAMILY HISTORY Family History  Problem Relation Age of Onset  . Heart disease Father   . Lung cancer Father   . Diabetes Mother   . Diabetes Sister   . Hypertension Sister   . Asthma Sister    the patient's father died from lung cancer at the age of 35. The patient's mother died from thyroid cancer at the age of 9. The patient had 2 brothers, 2 sisters. There is no other history of breast or ovarian cancer in the family to her knowledge  GYN HISTORY: Menarche age 60, first live birth age 35. The patient is GX P2. She went through the change of life approximately age 65. She did not take hormone replacement.  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 She is now disabled secondary to her knee problems. She is divorced. At home she lives with her 2 daughters, [name] who works in direct supports to mentally retarded children, and [name], who is a group Games developer for mentally retarded children. The patient has no grandchildren. She attends a [employer]  ADVANCED DIRECTIVES: Not in place; these have been discussed with the patient, most recently in the 07/28/2013 visit.  HEALTH MAINTENANCE: History  Substance Use Topics  . Smoking status: Former Smoker -- 0.25 packs/day for 20 years    Quit date: 02/10/1991  . Smokeless tobacco: Never Used  . Alcohol Use: No      Allergies  Allergen Reactions  . Procardia [Nifedipine] Other (See Comments)    MIGRAINES  . Aspirin     Unable to take due to blood pressure medication  . Diltiazem Itching    Current Outpatient Prescriptions  Medication Sig Dispense Refill  . ACCU-CHEK SMARTVIEW test strip USE ONE TEST STRIP TO CHECK BLOOD SUGAR 4 TIMES DAILY 100 each 2  . acetaminophen (TYLENOL) 325 MG tablet Take 2 tablets  (650 mg total) by mouth every 6 (six) hours as needed for mild pain or headache.    . albuterol (PROVENTIL HFA;VENTOLIN HFA) 108 (90 BASE) MCG/ACT inhaler Inhale 2 puffs into the lungs every 4 (four) hours as needed. 18 g 2  . amLODipine (NORVASC) 10 MG tablet TAKE ONE TABLET BY MOUTH ONCE DAILY. 90 tablet 0  . anastrozole (ARIMIDEX) 1 MG tablet Take 1 tablet (1 mg total) by mouth daily. 90 tablet 2  . apixaban (ELIQUIS) 5 MG TABS tablet Take 1 tablet (5 mg total) by mouth 2 (two) times daily. 60 tablet 6  . Artificial Tear Ointment (ARTIFICIAL TEARS) ointment Place 1 drop into both eyes as needed (for grey eye disease).     . benazepril (LOTENSIN) 40 MG tablet TAKE ONE TABLET BY MOUTH ONCE DAILY. 30 tablet 5  . calcium carbonate (TUMS EX) 750 MG chewable tablet Chew 2 tablets by mouth 4 (four) times daily.    . carvedilol (COREG) 12.5 MG tablet Take 1 tablet (12.5 mg  total) by mouth 2 (two) times daily. 180 tablet 3  . cholecalciferol (VITAMIN D) 1000 UNITS tablet Take 1,000 Units by mouth daily.    Marland Kitchen gabapentin (NEURONTIN) 100 MG capsule Take 1 capsule (100 mg total) by mouth 3 (three) times daily. 90 capsule 5  . levothyroxine (SYNTHROID, LEVOTHROID) 100 MCG tablet     . loratadine (CLARITIN) 10 MG tablet Take 1 tablet (10 mg total) by mouth daily. 30 tablet 5  . metFORMIN (GLUCOPHAGE) 500 MG tablet TAKE (1) TABLET BY MOUTH TWICE A DAY WITH MEALS (BREAKFAST AND SUPPER). 60 tablet 5  . pravastatin (PRAVACHOL) 40 MG tablet Take 40 mg by mouth daily.    . vitamin B-12 (CYANOCOBALAMIN) 500 MCG tablet Take 500 mcg by mouth daily.     No current facility-administered medications for this visit.    OBJECTIVE: Middle-aged [place] American woman in no acute distress   Filed Vitals:   05/03/14 0947  BP: 148/71  Pulse: 84  Temp: 98 F (36.7 C)  Resp: 18     Body mass index is 45.19 kg/(m^2).      ECOG: 1  Skin: warm, dry  HEENT: sclerae anicteric, conjunctivae pink, oropharynx clear. No  thrush or mucositis.  Lymph Nodes: No cervical or supraclavicular lymphadenopathy  Lungs: clear to auscultation bilaterally, no rales, wheezes, or rhonci  Heart: regular rate and rhythm  Abdomen: round, soft, non tender, positive bowel sounds  Musculoskeletal: No focal spinal tenderness, no peripheral edema  Neuro: non focal, well oriented, positive affect  Breasts: right breast status post lumpectomy and radiation. No evidence of recurrent disease. Right axilla benign. Left breast unremarkable.   LAB RESULTS:  CMP     Component Value Date/Time   NA 144 05/03/2014 0928   NA 145* 04/26/2014 0951   NA 139 12/29/2013 1209   K 4.1 05/03/2014 0928   K 4.1 04/26/2014 0951   CL 102 04/26/2014 0951   CO2 21* 05/03/2014 0928   CO2 25 04/26/2014 0951   GLUCOSE 161* 05/03/2014 0928   GLUCOSE 136* 04/26/2014 0951   GLUCOSE 144* 12/29/2013 1209   BUN 18.6 05/03/2014 0928   BUN 17 04/26/2014 0951   BUN 19 12/29/2013 1209   CREATININE 1.2* 05/03/2014 0928   CREATININE 1.08* 04/26/2014 0951   CREATININE 1.28* 05/06/2013 1149   CALCIUM 8.9 05/03/2014 0928   CALCIUM 9.0 04/26/2014 0951   CALCIUM 6.4* 05/08/2013 1215   PROT 7.0 05/03/2014 0928   PROT 7.1 04/26/2014 0951   PROT 6.7 11/04/2013 0513   ALBUMIN 3.7 05/03/2014 0928   ALBUMIN 3.4* 11/04/2013 0513   AST 17 05/03/2014 0928   AST 22 04/26/2014 0951   ALT 15 05/03/2014 0928   ALT 11 04/26/2014 0951   ALKPHOS 76 05/03/2014 0928   ALKPHOS 91 04/26/2014 0951   BILITOT 0.24 05/03/2014 0928   BILITOT 0.4 04/26/2014 0951   BILITOT 0.3 11/04/2013 0513   GFRNONAA 56* 04/26/2014 0951   GFRAA 65 04/26/2014 0951    I No results found for: SPEP  Lab Results  Component Value Date   WBC 4.8 05/03/2014   NEUTROABS 3.1 05/03/2014   HGB 11.2* 05/03/2014   HCT 34.3* 05/03/2014   MCV 83.7 05/03/2014   PLT 191 05/03/2014      Chemistry      Component Value Date/Time   NA 144 05/03/2014 0928   NA 145* 04/26/2014 0951   NA 139  12/29/2013 1209   K 4.1 05/03/2014 0928   K 4.1 04/26/2014  0951   CL 102 04/26/2014 0951   CO2 21* 05/03/2014 0928   CO2 25 04/26/2014 0951   BUN 18.6 05/03/2014 0928   BUN 17 04/26/2014 0951   BUN 19 12/29/2013 1209   CREATININE 1.2* 05/03/2014 0928   CREATININE 1.08* 04/26/2014 0951   CREATININE 1.28* 05/06/2013 1149      Component Value Date/Time   CALCIUM 8.9 05/03/2014 0928   CALCIUM 9.0 04/26/2014 0951   CALCIUM 6.4* 05/08/2013 1215   ALKPHOS 76 05/03/2014 0928   ALKPHOS 91 04/26/2014 0951   AST 17 05/03/2014 0928   AST 22 04/26/2014 0951   ALT 15 05/03/2014 0928   ALT 11 04/26/2014 0951   BILITOT 0.24 05/03/2014 0928   BILITOT 0.4 04/26/2014 0951   BILITOT 0.3 11/04/2013 0513       No results found for: LABCA2  No components found for: NHAFB903  No results for input(s): INR in the last 168 hours.  Urinalysis    Component Value Date/Time   COLORURINE YELLOW 09/06/2013 1040   APPEARANCEUR CLEAR 09/06/2013 1040   LABSPEC 1.015 09/06/2013 1040   PHURINE 5.0 09/06/2013 1040   GLUCOSEU NEGATIVE 09/06/2013 1040   HGBUR NEGATIVE 09/06/2013 1040   BILIRUBINUR NEGATIVE 09/06/2013 1040   KETONESUR NEGATIVE 09/06/2013 1040   PROTEINUR NEGATIVE 09/06/2013 1040   UROBILINOGEN 0.2 09/06/2013 1040   NITRITE NEGATIVE 09/06/2013 1040   LEUKOCYTESUR NEGATIVE 09/06/2013 1040    STUDIES: Most recent bone density scan on 01/18/13 showed a t-score of -0.3 (normal)  Most recent mammogram on 03/14/14 was unremarkable.   ASSESSMENT: 59 y.o. [place] woman   (1) status post right breast and right axillary lymph node biopsy 01/26/2013,  for a clinical T1c N1, stage IIA invasive ductal carcinoma, grade 3, estrogen and progesterone receptor positive, HER-2 amplified by immunohistochemistry (3+), with an MIB-1 of 70%.  (2) treated neo-adjuvantly with 4 of 6 planned cycles of docetaxel, carboplatin, trastuzumab,and pertuzumab, last dose 04/27/2013   (a) final 2 cycles of  chemotherapy omitted in the face of multiple renal and electrolyte abnormalities    (b) pertuzumab and trastuzumab to be continued until definitive surgery  (c) trastuzumab alone being continued to complete one year of anti-HER-2 treatment  (d) echocardiograms every 3 months while on trastuzumab; most recent study 03/31/14  (3) status post right lumpectomy and sentinel lymph node sampling 07/19/2013 showing a complete pathologic response (ypT0 ypN0)  (4) adjuvant radiation completed on 11/29/13  (5) to begin anastrozole daily on 12/22/13  PLAN: [name] continues do to well. The labs were reviewed in detail and were entirely stable. She will proceed with her last does of trastuzumab as planned today. She is tolerating the anastrozole well and will continue this for 5 years of antiestrogen therapy. She had a bone density scan in January that was completely normal. Her mammogram last month was also benign.  I have sent a referral to Dr. Josetta Huddle office to have her port removed.   [name] will return in 4 months (per research study protocol) for labs and a follow up visit. She understands and agrees with this plan. She knows the goal of treatment in her case is cure. She has been encouraged to call with any issues that might arise before her next visit here.  Laurie Panda, NP

## 2014-05-03 NOTE — Patient Instructions (Signed)
[facility] Discharge Instructions for Patients Receiving Chemotherapy  Today you received the following chemotherapy agents Herceptin  To help prevent nausea and vomiting after your treatment, we encourage you to take your nausea medication    If you develop nausea and vomiting that is not controlled by your nausea medication, call the clinic.   BELOW ARE SYMPTOMS THAT SHOULD BE REPORTED IMMEDIATELY:  *FEVER GREATER THAN 100.5 F  *CHILLS WITH OR WITHOUT FEVER  NAUSEA AND VOMITING THAT IS NOT CONTROLLED WITH YOUR NAUSEA MEDICATION  *UNUSUAL SHORTNESS OF BREATH  *UNUSUAL BRUISING OR BLEEDING  TENDERNESS IN MOUTH AND THROAT WITH OR WITHOUT PRESENCE OF ULCERS  *URINARY PROBLEMS  *BOWEL PROBLEMS  UNUSUAL RASH Items with * indicate a potential emergency and should be followed up as soon as possible.  Feel free to call the clinic you have any questions or concerns. The clinic phone number is [phone].  Please show the CHEMO ALERT CARD at check-in to the Emergency Department and triage nurse.   

## 2014-05-05 ENCOUNTER — Other Ambulatory Visit: Payer: Self-pay | Admitting: *Deleted

## 2014-05-05 ENCOUNTER — Other Ambulatory Visit: Payer: Self-pay

## 2014-05-05 ENCOUNTER — Telehealth: Payer: Self-pay | Admitting: Pulmonary Disease

## 2014-05-05 DIAGNOSIS — G4733 Obstructive sleep apnea (adult) (pediatric): Secondary | ICD-10-CM

## 2014-05-05 DIAGNOSIS — G4730 Sleep apnea, unspecified: Secondary | ICD-10-CM | POA: Diagnosis not present

## 2014-05-05 MED ORDER — LEVOTHYROXINE SODIUM 88 MCG PO TABS: 88.0000 ug | ORAL_TABLET | Freq: Every day | ORAL | Status: DC

## 2014-05-05 NOTE — Telephone Encounter (Signed)
Patient notified of results.  CPAP Titration study ordered. Nothing further needed.

## 2014-05-05 NOTE — Telephone Encounter (Signed)
HST showed moderate OSA- AHI 25/h CPAP titration study if willing,as discussed on OV

## 2014-05-09 ENCOUNTER — Ambulatory Visit: Payer: Self-pay | Admitting: Surgery

## 2014-05-12 ENCOUNTER — Encounter (HOSPITAL_BASED_OUTPATIENT_CLINIC_OR_DEPARTMENT_OTHER): Payer: Self-pay | Admitting: *Deleted

## 2014-05-12 NOTE — Progress Notes (Signed)
Labs and ekg done-just had sleep study-will try cpap

## 2014-05-18 ENCOUNTER — Encounter (HOSPITAL_BASED_OUTPATIENT_CLINIC_OR_DEPARTMENT_OTHER)
Admission: RE | Disposition: A | Discharge status: Home / Self Care | Payer: Self-pay | Source: Ambulatory Visit | Attending: Surgery

## 2014-05-18 ENCOUNTER — Ambulatory Visit (HOSPITAL_BASED_OUTPATIENT_CLINIC_OR_DEPARTMENT_OTHER)
Admission: RE | Admit: 2014-05-18 | Discharge: 2014-05-18 | Disposition: A | Discharge status: Home / Self Care | Payer: Medicare Other | Source: Ambulatory Visit | Attending: Surgery | Admitting: Surgery

## 2014-05-18 ENCOUNTER — Ambulatory Visit (HOSPITAL_BASED_OUTPATIENT_CLINIC_OR_DEPARTMENT_OTHER): Payer: Medicare Other | Admitting: Certified Registered"

## 2014-05-18 ENCOUNTER — Encounter (HOSPITAL_BASED_OUTPATIENT_CLINIC_OR_DEPARTMENT_OTHER): Payer: Self-pay | Admitting: Certified Registered"

## 2014-05-18 DIAGNOSIS — K219 Gastro-esophageal reflux disease without esophagitis: Secondary | ICD-10-CM | POA: Insufficient documentation

## 2014-05-18 DIAGNOSIS — Z6841 Body Mass Index (BMI) 40.0 and over, adult: Secondary | ICD-10-CM | POA: Insufficient documentation

## 2014-05-18 DIAGNOSIS — M1990 Unspecified osteoarthritis, unspecified site: Secondary | ICD-10-CM | POA: Insufficient documentation

## 2014-05-18 DIAGNOSIS — E785 Hyperlipidemia, unspecified: Secondary | ICD-10-CM | POA: Insufficient documentation

## 2014-05-18 DIAGNOSIS — Z9049 Acquired absence of other specified parts of digestive tract: Secondary | ICD-10-CM | POA: Diagnosis not present

## 2014-05-18 DIAGNOSIS — J45909 Unspecified asthma, uncomplicated: Secondary | ICD-10-CM | POA: Diagnosis not present

## 2014-05-18 DIAGNOSIS — I10 Essential (primary) hypertension: Secondary | ICD-10-CM | POA: Diagnosis not present

## 2014-05-18 DIAGNOSIS — Z96652 Presence of left artificial knee joint: Secondary | ICD-10-CM | POA: Insufficient documentation

## 2014-05-18 DIAGNOSIS — Z853 Personal history of malignant neoplasm of breast: Secondary | ICD-10-CM | POA: Insufficient documentation

## 2014-05-18 DIAGNOSIS — G4730 Sleep apnea, unspecified: Secondary | ICD-10-CM | POA: Insufficient documentation

## 2014-05-18 DIAGNOSIS — Z888 Allergy status to other drugs, medicaments and biological substances status: Secondary | ICD-10-CM | POA: Insufficient documentation

## 2014-05-18 DIAGNOSIS — E0500 Thyrotoxicosis with diffuse goiter without thyrotoxic crisis or storm: Secondary | ICD-10-CM | POA: Insufficient documentation

## 2014-05-18 DIAGNOSIS — E669 Obesity, unspecified: Secondary | ICD-10-CM | POA: Diagnosis not present

## 2014-05-18 DIAGNOSIS — L910 Hypertrophic scar: Secondary | ICD-10-CM | POA: Insufficient documentation

## 2014-05-18 DIAGNOSIS — Z9851 Tubal ligation status: Secondary | ICD-10-CM | POA: Diagnosis not present

## 2014-05-18 DIAGNOSIS — Z452 Encounter for adjustment and management of vascular access device: Secondary | ICD-10-CM | POA: Diagnosis present

## 2014-05-18 DIAGNOSIS — Z87891 Personal history of nicotine dependence: Secondary | ICD-10-CM | POA: Insufficient documentation

## 2014-05-18 DIAGNOSIS — Z923 Personal history of irradiation: Secondary | ICD-10-CM | POA: Diagnosis not present

## 2014-05-18 DIAGNOSIS — Z9011 Acquired absence of right breast and nipple: Secondary | ICD-10-CM | POA: Diagnosis not present

## 2014-05-18 DIAGNOSIS — E119 Type 2 diabetes mellitus without complications: Secondary | ICD-10-CM | POA: Diagnosis not present

## 2014-05-18 HISTORY — PX: PORT-A-CATH REMOVAL: SHX5289

## 2014-05-18 HISTORY — DX: Sleep apnea, unspecified: G47.30

## 2014-05-18 HISTORY — PX: EXCISION OF KELOID: SHX6267

## 2014-05-18 LAB — GLUCOSE, CAPILLARY
GLUCOSE-CAPILLARY: 136 mg/dL — AB (ref 70–99)
Glucose-Capillary: 120 mg/dL — ABNORMAL HIGH (ref 70–99)

## 2014-05-18 SURGERY — REMOVAL PORT-A-CATH
Anesthesia: General | Site: Chest | Laterality: Right

## 2014-05-18 MED ORDER — LACTATED RINGERS IV SOLN
INTRAVENOUS | Status: DC
  Administered 2014-05-18 (×2): via INTRAVENOUS

## 2014-05-18 MED ORDER — HYDROCODONE-ACETAMINOPHEN 5-325 MG PO TABS: 1.0000 | ORAL_TABLET | Freq: Four times a day (QID) | ORAL | Status: DC | PRN

## 2014-05-18 MED ORDER — FENTANYL CITRATE (PF) 100 MCG/2ML IJ SOLN: 25.0000 ug | INTRAMUSCULAR | Status: DC | PRN

## 2014-05-18 MED ORDER — LIDOCAINE HCL (CARDIAC) 20 MG/ML IV SOLN
INTRAVENOUS | Status: DC | PRN
  Administered 2014-05-18: 50 mg via INTRAVENOUS

## 2014-05-18 MED ORDER — OXYCODONE HCL 5 MG PO TABS: 5.0000 mg | ORAL_TABLET | Freq: Once | ORAL | Status: DC | PRN

## 2014-05-18 MED ORDER — CHLORHEXIDINE GLUCONATE 4 % EX LIQD: 1.0000 "application " | Freq: Once | CUTANEOUS | Status: DC

## 2014-05-18 MED ORDER — MIDAZOLAM HCL 2 MG/2ML IJ SOLN
INTRAMUSCULAR | Status: AC
  Filled 2014-05-18: qty 2

## 2014-05-18 MED ORDER — FENTANYL CITRATE (PF) 100 MCG/2ML IJ SOLN
INTRAMUSCULAR | Status: DC | PRN
  Administered 2014-05-18: 100 ug via INTRAVENOUS

## 2014-05-18 MED ORDER — ONDANSETRON HCL 4 MG/2ML IJ SOLN
INTRAMUSCULAR | Status: DC | PRN
  Administered 2014-05-18: 4 mg via INTRAVENOUS

## 2014-05-18 MED ORDER — OXYCODONE HCL 5 MG/5ML PO SOLN: 5.0000 mg | Freq: Once | ORAL | Status: DC | PRN

## 2014-05-18 MED ORDER — PROPOFOL 10 MG/ML IV BOLUS
INTRAVENOUS | Status: DC | PRN
  Administered 2014-05-18: 200 mg via INTRAVENOUS

## 2014-05-18 MED ORDER — DEXAMETHASONE SODIUM PHOSPHATE 10 MG/ML IJ SOLN
INTRAMUSCULAR | Status: DC | PRN
  Administered 2014-05-18: 4 mg via INTRAVENOUS

## 2014-05-18 MED ORDER — ACETAMINOPHEN 160 MG/5ML PO SOLN: 325.0000 mg | ORAL | Status: DC | PRN

## 2014-05-18 MED ORDER — CEFAZOLIN SODIUM 1-5 GM-% IV SOLN
INTRAVENOUS | Status: AC
  Filled 2014-05-18: qty 50

## 2014-05-18 MED ORDER — MIDAZOLAM HCL 2 MG/2ML IJ SOLN
1.0000 mg | INTRAMUSCULAR | Status: DC | PRN
  Administered 2014-05-18: 2 mg via INTRAVENOUS

## 2014-05-18 MED ORDER — CEFAZOLIN SODIUM-DEXTROSE 2-3 GM-% IV SOLR
INTRAVENOUS | Status: AC
  Filled 2014-05-18: qty 50

## 2014-05-18 MED ORDER — FENTANYL CITRATE (PF) 100 MCG/2ML IJ SOLN
INTRAMUSCULAR | Status: AC
  Filled 2014-05-18: qty 4

## 2014-05-18 MED ORDER — BUPIVACAINE-EPINEPHRINE 0.25% -1:200000 IJ SOLN
INTRAMUSCULAR | Status: DC | PRN
  Administered 2014-05-18: 8.5 mL

## 2014-05-18 MED ORDER — ACETAMINOPHEN 325 MG PO TABS: 325.0000 mg | ORAL_TABLET | ORAL | Status: DC | PRN

## 2014-05-18 MED ORDER — FENTANYL CITRATE (PF) 100 MCG/2ML IJ SOLN: 50.0000 ug | INTRAMUSCULAR | Status: DC | PRN

## 2014-05-18 MED ORDER — DEXTROSE 5 % IV SOLN
3.0000 g | INTRAVENOUS | Status: AC
  Administered 2014-05-18: 3 g via INTRAVENOUS

## 2014-05-18 MED ORDER — GLYCOPYRROLATE 0.2 MG/ML IJ SOLN
0.2000 mg | Freq: Once | INTRAMUSCULAR | Status: AC | PRN
  Administered 2014-05-18: 0.2 mg via INTRAVENOUS

## 2014-05-18 MED ORDER — PROMETHAZINE HCL 25 MG/ML IJ SOLN: 6.2500 mg | INTRAMUSCULAR | Status: DC | PRN

## 2014-05-18 SURGICAL SUPPLY — 35 items
APL SKNCLS STERI-STRIP NONHPOA (GAUZE/BANDAGES/DRESSINGS)
BENZOIN TINCTURE PRP APPL 2/3 (GAUZE/BANDAGES/DRESSINGS) IMPLANT
BLADE SURG 15 STRL LF DISP TIS (BLADE) ×2 IMPLANT
BLADE SURG 15 STRL SS (BLADE) ×3
CHLORAPREP W/TINT 26ML (MISCELLANEOUS) ×3 IMPLANT
COVER BACK TABLE 60X90IN (DRAPES) ×3 IMPLANT
COVER MAYO STAND STRL (DRAPES) ×3 IMPLANT
DECANTER SPIKE VIAL GLASS SM (MISCELLANEOUS) ×2 IMPLANT
DRAPE LAPAROTOMY 100X72 PEDS (DRAPES) ×3 IMPLANT
DRAPE UTILITY XL STRL (DRAPES) ×3 IMPLANT
ELECT COATED BLADE 2.86 ST (ELECTRODE) ×1 IMPLANT
ELECT REM PT RETURN 9FT ADLT (ELECTROSURGICAL) ×3
ELECTRODE REM PT RTRN 9FT ADLT (ELECTROSURGICAL) ×2 IMPLANT
GLOVE BIOGEL PI IND STRL 8 (GLOVE) ×2 IMPLANT
GLOVE BIOGEL PI INDICATOR 8 (GLOVE) ×1
GLOVE ECLIPSE 8.0 STRL XLNG CF (GLOVE) ×3 IMPLANT
GLOVE EXAM NITRILE EXT CUFF MD (GLOVE) ×1 IMPLANT
GLOVE SURG SS PI 7.0 STRL IVOR (GLOVE) ×1 IMPLANT
GOWN STRL REUS W/ TWL LRG LVL3 (GOWN DISPOSABLE) ×4 IMPLANT
GOWN STRL REUS W/TWL LRG LVL3 (GOWN DISPOSABLE) ×6
LIQUID BAND (GAUZE/BANDAGES/DRESSINGS) ×3 IMPLANT
NDL HYPO 25X1 1.5 SAFETY (NEEDLE) ×2 IMPLANT
NEEDLE HYPO 25X1 1.5 SAFETY (NEEDLE) ×3 IMPLANT
NS IRRIG 1000ML POUR BTL (IV SOLUTION) ×2 IMPLANT
PACK BASIN DAY SURGERY FS (CUSTOM PROCEDURE TRAY) ×3 IMPLANT
PENCIL BUTTON HOLSTER BLD 10FT (ELECTRODE) ×1 IMPLANT
SLEEVE SCD COMPRESS KNEE MED (MISCELLANEOUS) IMPLANT
SPONGE LAP 4X18 X RAY DECT (DISPOSABLE) ×3 IMPLANT
STRIP CLOSURE SKIN 1/2X4 (GAUZE/BANDAGES/DRESSINGS) IMPLANT
SUT MON AB 4-0 PC3 18 (SUTURE) ×3 IMPLANT
SUT VIC AB 3-0 SH 27 (SUTURE) ×6
SUT VIC AB 3-0 SH 27X BRD (SUTURE) IMPLANT
SYR CONTROL 10ML LL (SYRINGE) ×3 IMPLANT
TOWEL OR 17X24 6PK STRL BLUE (TOWEL DISPOSABLE) ×3 IMPLANT
TOWEL OR NON WOVEN STRL DISP B (DISPOSABLE) ×2 IMPLANT

## 2014-05-18 NOTE — Interval H&P Note (Signed)
History and Physical Interval Note:  05/18/2014 10:17 AM  [name]  has presented today for surgery, with the diagnosis of History of Breast Cancer  The various methods of treatment have been discussed with the patient and family. After consideration of risks, benefits and other options for treatment, the patient has consented to  Procedure(s): REMOVAL PORT-A-CATH (N/A) as a surgical intervention .  The patient's history has been reviewed, patient examined, no change in status, stable for surgery.  I have reviewed the patient's chart and labs.  Questions were answered to the patient's satisfaction.     Channah Godeaux A.

## 2014-05-18 NOTE — Anesthesia Preprocedure Evaluation (Addendum)
Anesthesia Evaluation  Patient identified by MRN, date of birth, ID band Patient awake    Reviewed: Allergy & Precautions, NPO status , Patient's Chart, lab work & pertinent test results, reviewed documented beta blocker date and time   History of Anesthesia Complications Negative for: history of anesthetic complications  Airway Mallampati: II  TM Distance: >3 FB Neck ROM: Full    Dental no notable dental hx. (+)    Pulmonary neg pulmonary ROS, neg shortness of breath, asthma , sleep apnea , neg recent URI, former smoker,  breath sounds clear to auscultation  Pulmonary exam normal + decreased breath sounds      Cardiovascular hypertension, Pt. on medications and Pt. on home beta blockers - angina- Past MI and - CHF Normal cardiovascular exam- dysrhythmias Rhythm:Regular Rate:Normal     Neuro/Psych negative neurological ROS  negative psych ROS   GI/Hepatic Neg liver ROS, GERD-  Controlled,  Endo/Other  diabetes, Type 2, Oral Hypoglycemic AgentsHypothyroidism Morbid obesity  Renal/GU CRFRenal diseasenegative Renal ROS  negative genitourinary   Musculoskeletal negative musculoskeletal ROS (+)   Abdominal   Peds negative pediatric ROS (+)  Hematology negative hematology ROS (+)   Anesthesia Other Findings   Reproductive/Obstetrics negative OB ROS                           Anesthesia Physical Anesthesia Plan  ASA: III  Anesthesia Plan: General   Post-op Pain Management:    Induction: Intravenous  Airway Management Planned: LMA  Additional Equipment: None  Intra-op Plan:   Post-operative Plan: Extubation in OR  Informed Consent: I have reviewed the patients History and Physical, chart, labs and discussed the procedure including the risks, benefits and alternatives for the proposed anesthesia with the patient or authorized representative who has indicated his/her understanding and  acceptance.   Dental advisory given  Plan Discussed with: CRNA and Surgeon  Anesthesia Plan Comments:        Anesthesia Quick Evaluation

## 2014-05-18 NOTE — Anesthesia Procedure Notes (Signed)
Procedure Name: LMA Insertion Date/Time: 05/18/2014 10:46 AM Performed by: Melynda Ripple D Pre-anesthesia Checklist: Patient identified, Emergency Drugs available, Suction available and Patient being monitored Patient Re-evaluated:Patient Re-evaluated prior to inductionOxygen Delivery Method: Circle System Utilized Preoxygenation: Pre-oxygenation with 100% oxygen Intubation Type: IV induction Ventilation: Mask ventilation without difficulty LMA: LMA inserted LMA Size: 4.0 Number of attempts: 1 Airway Equipment and Method: Bite block Placement Confirmation: positive ETCO2 Tube secured with: Tape Dental Injury: Teeth and Oropharynx as per pre-operative assessment

## 2014-05-18 NOTE — Discharge Instructions (Signed)
GENERAL SURGERY: POST OP INSTRUCTIONS ° °1. DIET: Follow a light bland diet the first 24 hours after arrival home, such as soup, liquids, crackers, etc.  Be sure to include lots of fluids daily.  Avoid fast food or heavy meals as your are more likely to get nauseated.   °2. Take your usually prescribed home medications unless otherwise directed. °3. PAIN CONTROL: °a. Pain is best controlled by a usual combination of three different methods TOGETHER: °i. Ice/Heat °ii. Over the counter pain medication °iii. Prescription pain medication °b. Most patients will experience some swelling and bruising around the incisions.  Ice packs or heating pads (30-60 minutes up to 6 times a day) will help. Use ice for the first few days to help decrease swelling and bruising, then switch to heat to help relax tight/sore spots and speed recovery.  Some people prefer to use ice alone, heat alone, alternating between ice & heat.  Experiment to what works for you.  Swelling and bruising can take several weeks to resolve.   °c. It is helpful to take an over-the-counter pain medication regularly for the first few weeks.  Choose one of the following that works best for you: °i. Naproxen (Aleve, etc)  Two 220mg tabs twice a day °ii. Ibuprofen (Advil, etc) Three 200mg tabs four times a day (every meal & bedtime) °iii. Acetaminophen (Tylenol, etc) 500-650mg four times a day (every meal & bedtime) °d. A  prescription for pain medication (such as oxycodone, hydrocodone, etc) should be given to you upon discharge.  Take your pain medication as prescribed.  °i. If you are having problems/concerns with the prescription medicine (does not control pain, nausea, vomiting, rash, itching, etc), please call us [phone] to see if we need to switch you to a different pain medicine that will work better for you and/or control your side effect better. °ii. If you need a refill on your pain medication, please contact your pharmacy.  They will contact our  office to request authorization. Prescriptions will not be filled after 5 pm or on week-ends. °4. Avoid getting constipated.  Between the surgery and the pain medications, it is common to experience some constipation.  Increasing fluid intake and taking a fiber supplement (such as Metamucil, Citrucel, FiberCon, MiraLax, etc) 1-2 times a day regularly will usually help prevent this problem from occurring.  A mild laxative (prune juice, Milk of Magnesia, MiraLax, etc) should be taken according to package directions if there are no bowel movements after 48 hours.   °5. Wash / shower every day.  You may shower over the dressings as they are waterproof.  Continue to shower over incision(s) after the dressing is off. °6. Remove your waterproof bandages 5 days after surgery.  You may leave the incision open to air.  You may have skin tapes (Steri Strips) covering the incision(s).  Leave them on until one week, then remove.  You may replace a dressing/Band-Aid to cover the incision for comfort if you wish.  ° ° ° ° °7. ACTIVITIES as tolerated:   °a. You may resume regular (light) daily activities beginning the next day--such as daily self-care, walking, climbing stairs--gradually increasing activities as tolerated.  If you can walk 30 minutes without difficulty, it is safe to try more intense activity such as jogging, treadmill, bicycling, low-impact aerobics, swimming, etc. °b. Save the most intensive and strenuous activity for last such as sit-ups, heavy lifting, contact sports, etc  Refrain from any heavy lifting or straining until you   are off narcotics for pain control.   °c. DO NOT PUSH THROUGH PAIN.  Let pain be your guide: If it hurts to do something, don't do it.  Pain is your body warning you to avoid that activity for another week until the pain goes down. °d. You may drive when you are no longer taking prescription pain medication, you can comfortably wear a seatbelt, and you can safely maneuver your car and  apply brakes. °e. You may have sexual intercourse when it is comfortable.  °8. FOLLOW UP in our office °a. Please call [facility] at [phone] to set up an appointment to see your surgeon in the office for a follow-up appointment approximately 2-3 weeks after your surgery. °b. Make sure that you call for this appointment the day you arrive home to insure a convenient appointment time. °9. IF YOU HAVE DISABILITY OR FAMILY LEAVE FORMS, BRING THEM TO THE OFFICE FOR PROCESSING.  DO NOT GIVE THEM TO YOUR DOCTOR. ° ° °WHEN TO CALL US [phone]: °1. Poor pain control °2. Reactions / problems with new medications (rash/itching, nausea, etc)  °3. Fever over 101.5 F (38.5 C) °4. Worsening swelling or bruising °5. Continued bleeding from incision. °6. Increased pain, redness, or drainage from the incision °7. Difficulty breathing / swallowing ° ° The clinic staff is available to answer your questions during regular business hours (8:30am-5pm).  Please don’t hesitate to call and ask to speak to one of our nurses for clinical concerns.  ° If you have a medical emergency, go to the nearest emergency room or call 911. ° A surgeon from [facility] is always on call at the hospitals ° ° °[facility] °[address], [address], [place], [place]  [address] ? °MAIN: [phone] ? TOLL FREE: [phone] ?  °FAX [phone] °[email or web address] ° °Post Anesthesia Home Care Instructions ° °Activity: °Get plenty of rest for the remainder of the day. A responsible adult should stay with you for 24 hours following the procedure.  °For the next 24 hours, DO NOT: °-Drive a car °-Operate machinery °-Drink alcoholic beverages °-Take any medication unless instructed by your physician °-Make any legal decisions or sign important papers. ° °Meals: °Start with liquid foods such as gelatin or soup. Progress to regular foods as tolerated. Avoid greasy, spicy, heavy foods. If nausea and/or  vomiting occur, drink only clear liquids until the nausea and/or vomiting subsides. Call your physician if vomiting continues. ° °Special Instructions/Symptoms: °Your throat may feel dry or sore from the anesthesia or the breathing tube placed in your throat during surgery. If this causes discomfort, gargle with warm salt water. The discomfort should disappear within 24 hours. ° °If you had a scopolamine patch placed behind your ear for the management of post- operative nausea and/or vomiting: ° °1. The medication in the patch is effective for 72 hours, after which it should be removed.  Wrap patch in a tissue and discard in the trash. Wash hands thoroughly with soap and water. °2. You may remove the patch earlier than 72 hours if you experience unpleasant side effects which may include dry mouth, dizziness or visual disturbances. °3. Avoid touching the patch. Wash your hands with soap and water after contact with the patch. °  ° °

## 2014-05-18 NOTE — Transfer of Care (Signed)
Immediate Anesthesia Transfer of Care Note  Patient: [name]  Procedure(s) Performed: Procedure(s): REMOVAL PORT-A-CATH (Right) EXCISION OF KELOID (N/A)  Patient Location: PACU  Anesthesia Type:General  Level of Consciousness: awake, alert  and oriented  Airway & Oxygen Therapy: Patient Spontanous Breathing and Patient connected to face mask oxygen  Post-op Assessment: Report given to RN and Post -op Vital signs reviewed and stable  Post vital signs: Reviewed and stable  Last Vitals:  Filed Vitals:   05/18/14 1009  BP: 152/73  Pulse: 69  Temp: 36.9 C  Resp: 20    Complications: No apparent anesthesia complications

## 2014-05-18 NOTE — Anesthesia Postprocedure Evaluation (Signed)
  Anesthesia Post-op Note  Patient: [name]  Procedure(s) Performed: Procedure(s): REMOVAL PORT-A-CATH (Right) EXCISION OF KELOID (N/A)  Patient Location: PACU  Anesthesia Type:General  Level of Consciousness: awake  Airway and Oxygen Therapy: Patient Spontanous Breathing  Post-op Pain: none  Post-op Assessment: Post-op Vital signs reviewed, Patient's Cardiovascular Status Stable, Respiratory Function Stable, Patent Airway, No signs of Nausea or vomiting and Pain level controlled  Post-op Vital Signs: Reviewed and stable  Last Vitals:  Filed Vitals:   05/18/14 1230  BP: 152/72  Pulse: 65  Temp: 36.6 C  Resp: 16    Complications: No apparent anesthesia complications

## 2014-05-18 NOTE — Op Note (Signed)
Preop diagnosis: Indwelling port a catheter for chemotherapy and 3 cm keloid upper chest  Postop diagnosis: Same  Procedure: Removal of port a catheter excision of 3 cm keloid upper chest   Surgeon: Erroll Luna M.D.  Anesthesia: LMA with local 0.25 % BUPIVICAINE with epinephrine  EBL: Minimal  Specimen none  Drains: None  Indications for procedure: The patient presents for removal of port a catheter after completing chemotherapy. The patient no longer requires central venous access. Risks of bleeding, infection, catheter fragmentation, embolization, arrhythmias and damage to arteries, veins and nerves and possibly other mediastinal structures discussed.  Pt desires excision of keloid scar as well.  The patient agrees to proceed.  Description of procedure: The patient was seen in the holding area. Questions were answered. The patient agreed to proceed. The patient was taken to the operating room. The patient was placed supine.  LMA Anesthesia was initiated. The skin on the upper chest was prepped and draped in a sterile fashion. Timeout was done. The patient received preoperative antibiotics. Incision was made through the old port site and the hub of the Port-A-Cath was seen. The sutures were cut to release the port from the chest wall. The catheter was removed in its entirety without difficulty. The tract was closed with 3-0 Vicryl. 4 Monocryl was used to close the skin.  The keloid scar was noted upper chest.  It was 3 cm and excised in its entirety.  Closed with 4 0 monocryl. All final counts were correct. The patient was taken to recovery in satisfactory condition.

## 2014-05-18 NOTE — H&P (Signed)
[name] is an 59 y.o. female.   Chief Complaint: remove port and keloid HPI: pt presents for port removal and keloid removal from chest.  Her chemotherapy is complete and she no longer needs it.  Has keloid over chest as well she wants removed.   Past Medical History  Diagnosis Date  . Hyperlipidemia   . Obesity   . Asthma     prn neb. and inhaler  . Osteoarthritis 2003    left knee  . Non-insulin dependent type 2 diabetes mellitus   . Graves' disease   . Ophthalmic manifestation of Graves disease   . Hypertension     on multiple meds., has been on med. > 14 yr.  . Dehydration 04/13/2013  . Breast cancer 01/2013    right  . Wears glasses   . GERD (gastroesophageal reflux disease)   . Radiation 10/11/13-11/29/13    Right Breast  . Sleep apnea 4/16    mod    Past Surgical History  Procedure Laterality Date  . Total knee arthroplasty Left 2003  . Total thyroidectomy    . Colonoscopy  2007  . Tubal ligation  1985  . Colonoscopy w/ polypectomy  2009  . Portacath placement Right 02/17/2013    Procedure: INSERTION PORT-A-CATH;  Surgeon: Joyice Faster. Brea Coleson, MD;  Location: [facility];  Service: General;  Laterality: Right;  . Tee without cardioversion N/A 11/08/2013    Procedure: TRANSESOPHAGEAL ECHOCARDIOGRAM (TEE);  Surgeon: Lelon Perla, MD;  Location: [facility] ENDOSCOPY;  Service: Cardiovascular;  Laterality: N/A;  . Cardioversion N/A 11/08/2013    Procedure: CARDIOVERSION;  Surgeon: Lelon Perla, MD;  Location: [facility] ENDOSCOPY;  Service: Cardiovascular;  Laterality: N/A;    Family History  Problem Relation Age of Onset  . Heart disease Father   . Lung cancer Father   . Diabetes Mother   . Diabetes Sister   . Hypertension Sister   . Asthma Sister    Social History:  reports that she quit smoking about 23 years ago. She has never used smokeless tobacco. She reports that she does not drink alcohol or use illicit drugs.  Allergies:  Allergies  Allergen  Reactions  . Procardia [Nifedipine] Other (See Comments)    MIGRAINES  . Aspirin     Unable to take due to blood pressure medication  . Diltiazem Itching    Medications Prior to Admission  Medication Sig Dispense Refill  . albuterol (PROVENTIL HFA;VENTOLIN HFA) 108 (90 BASE) MCG/ACT inhaler Inhale 2 puffs into the lungs every 4 (four) hours as needed. 18 g 2  . amLODipine (NORVASC) 10 MG tablet TAKE ONE TABLET BY MOUTH ONCE DAILY. 90 tablet 0  . anastrozole (ARIMIDEX) 1 MG tablet Take 1 tablet (1 mg total) by mouth daily. 90 tablet 2  . apixaban (ELIQUIS) 5 MG TABS tablet Take 1 tablet (5 mg total) by mouth 2 (two) times daily. 60 tablet 6  . Artificial Tear Ointment (ARTIFICIAL TEARS) ointment Place 1 drop into both eyes as needed (for grey eye disease).     . benazepril (LOTENSIN) 40 MG tablet TAKE ONE TABLET BY MOUTH ONCE DAILY. 30 tablet 5  . calcium carbonate (TUMS EX) 750 MG chewable tablet Chew 2 tablets by mouth 4 (four) times daily.    . carvedilol (COREG) 12.5 MG tablet Take 1 tablet (12.5 mg total) by mouth 2 (two) times daily. 180 tablet 3  . cholecalciferol (VITAMIN D) 1000 UNITS tablet Take 1,000 Units by mouth daily.    Marland Kitchen  gabapentin (NEURONTIN) 100 MG capsule Take 1 capsule (100 mg total) by mouth 3 (three) times daily. 90 capsule 5  . levothyroxine (SYNTHROID, LEVOTHROID) 88 MCG tablet Take 1 tablet (88 mcg total) by mouth daily. 30 tablet 5  . loratadine (CLARITIN) 10 MG tablet Take 1 tablet (10 mg total) by mouth daily. 30 tablet 5  . metFORMIN (GLUCOPHAGE) 500 MG tablet TAKE (1) TABLET BY MOUTH TWICE A DAY WITH MEALS (BREAKFAST AND SUPPER). 60 tablet 5  . pravastatin (PRAVACHOL) 40 MG tablet Take 40 mg by mouth daily.    . vitamin B-12 (CYANOCOBALAMIN) 500 MCG tablet Take 500 mcg by mouth daily.    Marland Kitchen ACCU-CHEK SMARTVIEW test strip USE ONE TEST STRIP TO CHECK BLOOD SUGAR 4 TIMES DAILY 100 each 2  . acetaminophen (TYLENOL) 325 MG tablet Take 2 tablets (650 mg total) by  mouth every 6 (six) hours as needed for mild pain or headache.      No results found for this or any previous visit (from the past 48 hour(s)). No results found.  Review of Systems  Constitutional: Negative for fever and chills.  Respiratory: Negative for cough.   Cardiovascular: Negative.   Gastrointestinal: Negative.     Blood pressure 152/73, pulse 69, temperature 98.5 F (36.9 C), temperature source Oral, resp. rate 20, height 5' 4.5" (1.638 m), weight 120.657 kg (266 lb), SpO2 100 %. Physical Exam  HENT:  Head: Normocephalic.  Eyes: Pupils are equal, round, and reactive to light.  Neck: Neck supple.  Cardiovascular: Normal rate.   Respiratory: Effort normal.    Skin: Skin is warm and dry.     Assessment/Plan Port in place Keloid chest  Remove port and keloid. The procedure has been discussed with the patient.  Alternative therapies have been discussed with the patient.  Operative risks include bleeding,  Infection,  Organ injury,  Nerve injury,  Blood vessel injury,  DVT,  Pulmonary embolism,  Death,  And possible reoperation.  Medical management risks include worsening of present situation.  The success of the procedure is 50 -90 % at treating patients symptoms.  The patient understands and agrees to proceed.  Lindley Stachnik A. 05/18/2014, 10:14 AM

## 2014-05-19 ENCOUNTER — Encounter (HOSPITAL_BASED_OUTPATIENT_CLINIC_OR_DEPARTMENT_OTHER): Payer: Self-pay | Admitting: Surgery

## 2014-05-21 ENCOUNTER — Ambulatory Visit (HOSPITAL_BASED_OUTPATIENT_CLINIC_OR_DEPARTMENT_OTHER): Payer: Medicare Other | Attending: Pulmonary Disease

## 2014-05-21 VITALS — Ht 64.0 in | Wt 262.0 lb

## 2014-05-21 DIAGNOSIS — Z9989 Dependence on other enabling machines and devices: Secondary | ICD-10-CM

## 2014-05-21 DIAGNOSIS — G4733 Obstructive sleep apnea (adult) (pediatric): Secondary | ICD-10-CM | POA: Insufficient documentation

## 2014-05-25 ENCOUNTER — Ambulatory Visit: Payer: Medicare Other | Attending: Surgery | Admitting: Physical Therapy

## 2014-05-25 ENCOUNTER — Encounter: Payer: Self-pay | Admitting: Physical Therapy

## 2014-05-25 DIAGNOSIS — Z9189 Other specified personal risk factors, not elsewhere classified: Secondary | ICD-10-CM

## 2014-05-25 DIAGNOSIS — C50911 Malignant neoplasm of unspecified site of right female breast: Secondary | ICD-10-CM | POA: Insufficient documentation

## 2014-05-25 NOTE — Therapy (Signed)
[place], [place], [address] Phone: [phone]   Fax:  [phone]  Physical Therapy Evaluation  Patient Details  Name: [name] MRN: [id number] Date of Birth: 24-Apr-1955 Referring Provider:  Erroll Luna, MD  Encounter Date: 05/25/2014      PT End of Session - 05/25/14 1106    Visit Number 1   Number of Visits 1   PT Start Time 0900   PT Stop Time 0931   PT Time Calculation (min) 31 min   Activity Tolerance Patient tolerated treatment well   Behavior During Therapy [facility] for tasks assessed/performed      Past Medical History  Diagnosis Date  . Hyperlipidemia   . Obesity   . Asthma     prn neb. and inhaler  . Osteoarthritis 2003    left knee  . Non-insulin dependent type 2 diabetes mellitus   . Graves' disease   . Ophthalmic manifestation of Graves disease   . Hypertension     on multiple meds., has been on med. > 14 yr.  . Dehydration 04/13/2013  . Breast cancer 01/2013    right  . Wears glasses   . GERD (gastroesophageal reflux disease)   . Radiation 10/11/13-11/29/13    Right Breast  . Sleep apnea 4/16    mod    Past Surgical History  Procedure Laterality Date  . Total knee arthroplasty Left 2003  . Total thyroidectomy    . Colonoscopy  2007  . Tubal ligation  1985  . Colonoscopy w/ polypectomy  2009  . Portacath placement Right 02/17/2013    Procedure: INSERTION PORT-A-CATH;  Surgeon: Joyice Faster. Cornett, MD;  Location: [place];  Service: General;  Laterality: Right;  . Tee without cardioversion N/A 11/08/2013    Procedure: TRANSESOPHAGEAL ECHOCARDIOGRAM (TEE);  Surgeon: Lelon Perla, MD;  Location: [place];  Service: Cardiovascular;  Laterality: N/A;  . Cardioversion N/A 11/08/2013    Procedure: CARDIOVERSION;  Surgeon: Lelon Perla, MD;  Location: [place];  Service: Cardiovascular;  Laterality: N/A;  . Port-a-cath removal Right  05/18/2014    Procedure: REMOVAL PORT-A-CATH;  Surgeon: Erroll Luna, MD;  Location: [place];  Service: General;  Laterality: Right;  . Excision of keloid N/A 05/18/2014    Procedure: EXCISION OF KELOID;  Surgeon: Erroll Luna, MD;  Location: [place];  Service: General;  Laterality: N/A;    There were no vitals filed for this visit.  Visit Diagnosis:  At risk for lymphedema - Plan: PT plan of care cert/re-cert  Breast cancer, right breast - Plan: PT plan of care cert/re-cert      Subjective Assessment - 05/25/14 0907    Pertinent History Neoadjuvant chemotherapy ended 05/02/13 and right lumpectomy and SLNB 07/19/13 with 5 lymph nodes removed.  Right arm swelling began around 04/28/14. Port-a-cath removed 05/18/14.            [facility] PT Assessment - 05/25/14 0001    Assessment   Medical Diagnosis s/p right lumpectomy with SLNB   Onset Date 04/28/14   Precautions   Precautions Fall;Other (comment)  Hx right breast cancer   Precaution Comments Ambulates with rolling walker due to left knee knee replacement failure   Restrictions   Weight Bearing Restrictions No   Balance Screen   Has the patient fallen in the past 6 months No   Has the patient had a decrease in activity level because of a fear of  falling?  No   Is the patient reluctant to leave their home because of a fear of falling?  No   Home Environment   Living Enviornment Private residence   Living Arrangements Children   Available Help at Discharge Family   Type of [place] Access Level entry   Prior Function   Level of [place] with basic ADLs   Vocation On disability   Leisure She does not exercise   Cognition   Overall Cognitive Status Within Functional Limits for tasks assessed   Sensation   Additional Comments No pitting edema rt UE   Posture/Postural Control   Posture/Postural Control Postural limitations   Postural Limitations Rounded Shoulders;Forward  head   ROM / Strength   AROM / PROM / Strength AROM;Strength   AROM   AROM Assessment Site Shoulder   Right/Left Shoulder Right;Left   Right Shoulder Extension 40 Degrees   Right Shoulder Flexion 120 Degrees   Right Shoulder ABduction 106 Degrees   Right Shoulder Internal Rotation 75 Degrees   Right Shoulder External Rotation 71 Degrees   Left Shoulder Extension 40 Degrees   Left Shoulder Flexion 129 Degrees   Left Shoulder ABduction 121 Degrees   Left Shoulder Internal Rotation 68 Degrees   Left Shoulder External Rotation 63 Degrees   Strength   Strength Assessment Site Shoulder   Right/Left Shoulder Right;Left   Right Shoulder Flexion 4-/5   Right Shoulder Extension 5/5   Right Shoulder ABduction 4-/5   Right Shoulder Internal Rotation 5/5   Right Shoulder External Rotation 4-/5   Left Shoulder Flexion 5/5   Left Shoulder Extension 5/5   Left Shoulder ABduction 5/5   Left Shoulder Internal Rotation 5/5   Left Shoulder External Rotation 5/5           LYMPHEDEMA/ONCOLOGY QUESTIONNAIRE - 05/25/14 0915    Type   Cancer Type Right breast   Surgeries   Lumpectomy Date 07/19/13   Sentinel Lymph Node Biopsy Date 07/19/13   Number Lymph Nodes Removed 5   Date Lymphedema/Swelling Started   Date 04/28/14   Treatment   Active Chemotherapy Treatment No   Past Chemotherapy Treatment Yes   Date 05/03/14   Past Radiation Treatment Yes   Date 11/19/13   Body Site right breast   Current Hormone Treatment Yes   Date 12/13/13   Drug Name Aromasin   Past Hormone Therapy No   What other symptoms do you have   Are you Having Heaviness or Tightness Yes   Are you having Pain Yes   Are you having pitting edema No   Is it Hard or Difficult finding clothes that fit No   Do you have infections No   Is there Decreased scar mobility No   Stemmer Sign No   Lymphedema Stage   Stage STAGE 1 SPONTANEOUSLY REVERSIBLE   Lymphedema Assessments   Lymphedema Assessments Upper extremities    Right Upper Extremity Lymphedema   15 cm Proximal to Olecranon Process 49.5 cm   10 cm Proximal to Olecranon Process 44.5 cm   Olecranon Process 29 cm   10 cm Proximal to Ulnar Styloid Process 23.3 cm   Just Proximal to Ulnar Styloid Process 18.4 cm   Across Hand at [employer] 19.8 cm   At [place] of 2nd Digit 6.8 cm   Left Upper Extremity Lymphedema   15 cm Proximal to Olecranon Process 50.5 cm   10 cm Proximal to Olecranon Process 45.4 cm  Olecranon Process 29.5 cm   10 cm Proximal to Ulnar Styloid Process 23.4 cm   Just Proximal to Ulnar Styloid Process 17.1 cm   Across Hand at [employer] 20.3 cm   At [place] of 2nd Digit 6.7 cm             Plan - 06/05/14 1107    Clinical Impression Statement Patient is a very pleasant 59 y.o. woman who had a right lumpectomy and SLNB last year.  She has been doing well but had recent concerns that her right arm being swollen and was concerned about lymphedema.  Her evaluation today did not show any concerns of swelling.  Measurements were compared to pre-operative measurements and also those taken 6 months ago.  There was no significant difference noted.  She was encouraged to closely continue to monitor her right arm but at this time, there is no need for additional physical therapy.She does have shoulder ROM deficits but she reports this does not effect her function and she would rather not do therapy at this time.   Rehab Potential Excellent   Clinical Impairments Affecting Rehab Potential none   PT Frequency One time visit   PT Treatment/Interventions Patient/family education   Consulted and Agree with Plan of Care Patient;Family member/caregiver   Family Member Consulted Daughter Doristine Section - 05-Jun-2014 1126    Functional Assessment Tool Used Clinical Judgement   Functional Limitation Other PT primary   Other PT Primary Current Status [phone]) At least 1 percent but less than 20 percent impaired, limited or  restricted   Other PT Primary Goal Status (P4982) At least 1 percent but less than 20 percent impaired, limited or restricted   Other PT Primary Discharge Status [phone]) At least 1 percent but less than 20 percent impaired, limited or restricted       Problem List Patient Active Problem List   Diagnosis Date Noted  . OSA (obstructive sleep apnea) 04/04/2014  . GERD (gastroesophageal reflux disease) 12/29/2013  . Atrial flutter, paroxysmal 12/05/2013  . Mitral regurgitation 11/08/2013  . Atrial flutter 11/05/2013  . Narrow complex tachycardia 11/03/2013  . Sinus congestion 10/19/2013  . Cellulitis of breast, DDx inflammatroy Ca breast 09/06/2013  . Malnutrition of moderate degree 05/11/2013  . CKD (chronic kidney disease) stage 2, GFR 60-89 ml/min 05/08/2013  . Hyponatremia 05/08/2013  . UTI (urinary tract infection) 05/04/2013  . Hypocalcemia 05/04/2013  . Hypothyroidism 05/04/2013  . Breast cancer of upper-outer quadrant of right female breast 01/31/2013  . Atypical chest pain 02/12/2012  . Osteoarthritis   . Asthma 12/08/2010  . Degenerative joint disease 12/08/2010  . Obesity   . Anemia, iron deficiency   . Hyperlipidemia 10/16/2010  . Hypertension   . Diabetes mellitus, type 2   . Hyperthyroidism   . Tobacco abuse, in remission     Annia Friendly, PT Jun 05, 2014, 11:29 AM  [place], [place], [address] Phone: [phone]   Fax:  [phone]

## 2014-05-29 ENCOUNTER — Telehealth: Payer: Self-pay | Admitting: Pulmonary Disease

## 2014-05-29 DIAGNOSIS — G4733 Obstructive sleep apnea (adult) (pediatric): Secondary | ICD-10-CM

## 2014-05-29 DIAGNOSIS — G4730 Sleep apnea, unspecified: Secondary | ICD-10-CM | POA: Diagnosis not present

## 2014-05-29 NOTE — Sleep Study (Signed)
[place]  NAME: [name]  DATE OF BIRTH: July 03, 1955  MEDICAL RECORD NUMBER [id number]  LOCATION: [facility]  PHYSICIAN: ALVA,RAKESH V.  DATE OF STUDY: 05/21/14   SLEEP STUDY TYPE: CPAP titration study               REFERRING PHYSICIAN: Rigoberto Noel, MD  INDICATION FOR STUDY: 59 year old with breast cancer and moderate OSA. Home study showed AHI of 25 per hour. At the time of this study ,they weighed 262 pounds with a height of  5 ft 4 inches and the BMI of 45, neck size of 16 inches. Epworth sleepiness score was 0   This CPAP titration polysomnogram was performed with a sleep technologist in attendance. EEG, EOG,EMG and respiratory parameters recorded. Sleep stages, arousals, limb movements and respiratory data was scored according to criteria laid out by the American Academy of sleep medicine.  SLEEP ARCHITECTURE: Lights out was at 2150 PM and lights on was at 439 AM. Total sleep time was 305 minutes with sleep period time of 384 minutes and sleep efficiency of 75% .Sleep latency was 24 minutes with latency to REM sleep of 116 minutes and wake after sleep onset of 80 minutes.  Sleep stages as a percentage of total sleep time was N1 2% N2- 79 % and REM sleep 19 % ( 58 minutes) . The longest period of REM sleep was around 4:30 AM.   AROUSAL DATA : There were 35 arousals with an arousal index of 7 events per hour. Of these 35 were spontaneous, and 0 were associated with respiratory events and 0 were associated periodic limb movements  RESPIRATORY DATA: CPAP was initiated at 5 centimeters and titrated to a final level of 10 centimeters due to respiratory events and snoring. At the final level of 10 centimeters for93 minutes including 29 minutes of REM sleep, there were 0 obstructive apneas, 0 central apneas, 0 mixed apneas and 0 hypopneas with apnea -hypopnea index of 0 events per hour.  There was no relation to sleep stage or body position.  Titration was optimal.  MOVEMENT/PARASOMNIA: There were 0 PLMS with a PLM index of 0 events per hour. The PLM arousal index was 0 events per hour.  OXYGEN DATA: The lowest desaturation was 90 % during non-REM sleep and the desaturation index was 5 per hour. The saturations stayed below 88% for 0 minutes.  CARDIAC DATA: The low heart rate was 30 beats per minute. The high heart rate recorded was an artifact. No arrhythmias were noted   IMPRESSION :  1. Moderate obstructive sleep apnea with hypopneas causing sleep fragmentation and mild oxygen desaturation. 2. This was corrected by CPAP of 10 centimeters with a medium fullface mask. Titration was optimal. 3. No evidence of cardiac arrhythmias or behavioral disturbance during sleep. 4. Periodic limb movements were not noted.  RECOMMENDATION:    1. The treatment options for this degree of sleep disordered breathing includes weight loss and/or CPAP therapy. CPAP can be initiated at 10 centimeters with a medium fullface mask and compliance monitored at this level. 2. Patient should be cautioned against driving when sleepy 3. They should be asked to avoid medications with sedative side effects  Rigoberto Noel  MD Diplomate, American Board of Sleep Medicine  ELECTRONICALLY SIGNED ON: 05/29/2014  [facility] PH: [phone]   FX: [phone] [place]

## 2014-05-29 NOTE — Telephone Encounter (Signed)
Prescription sent to DME for- CPAP of 10 centimeters with a medium fullface mask. Please arrange office visit with TP in 6 weeks

## 2014-05-29 NOTE — Telephone Encounter (Signed)
Patient notified. Order re-entered with CPAP template.  Appointment scheduled with TP for follow up.  Nothing further needed.

## 2014-05-31 ENCOUNTER — Encounter: Payer: Self-pay | Admitting: Oncology

## 2014-05-31 NOTE — Progress Notes (Signed)
Applied for copay assistance and was approved w/ Patient [employer]. She is approved for $5000 for her chemo drugs for 12 months from 05/29/14 and will go back 6 months from date of letter. $1610 is a guaranteed award and the remaining award balance of $3350 is accessible on a [employer] basis. I forwarded copy of letter and proof of expenditure form to Arline Asp in the billing dept.

## 2014-06-07 ENCOUNTER — Other Ambulatory Visit: Payer: Self-pay | Admitting: Family Medicine

## 2014-07-07 ENCOUNTER — Other Ambulatory Visit: Payer: Self-pay | Admitting: Cardiology

## 2014-07-07 ENCOUNTER — Other Ambulatory Visit: Payer: Self-pay | Admitting: Family Medicine

## 2014-07-10 ENCOUNTER — Other Ambulatory Visit: Payer: Self-pay

## 2014-07-18 ENCOUNTER — Ambulatory Visit: Payer: Medicare Other | Admitting: Adult Health

## 2014-07-26 ENCOUNTER — Ambulatory Visit (INDEPENDENT_AMBULATORY_CARE_PROVIDER_SITE_OTHER): Payer: Medicare Other | Admitting: Family Medicine

## 2014-07-26 ENCOUNTER — Encounter: Payer: Self-pay | Admitting: Family Medicine

## 2014-07-26 VITALS — BP 134/88 | Ht 64.5 in | Wt 273.0 lb

## 2014-07-26 DIAGNOSIS — E119 Type 2 diabetes mellitus without complications: Secondary | ICD-10-CM | POA: Diagnosis not present

## 2014-07-26 LAB — POCT GLYCOSYLATED HEMOGLOBIN (HGB A1C): Hemoglobin A1C: 8.1

## 2014-07-26 MED ORDER — METFORMIN HCL 500 MG PO TABS: 1000.0000 mg | ORAL_TABLET | Freq: Two times a day (BID) | ORAL | Status: DC

## 2014-07-26 NOTE — Progress Notes (Signed)
   Subjective:    Patient ID: [name], female    DOB: 08/08/1955, 59 y.o.   MRN: [id number]  Diabetes She presents for her follow-up diabetic visit. She has type 2 diabetes mellitus. There are no hypoglycemic associated symptoms. There are no diabetic associated symptoms. There are no hypoglycemic complications. There are no diabetic complications. There are no known risk factors for coronary artery disease. Current diabetic treatment includes oral agent (monotherapy). She is compliant with treatment all of the time.   Patient has an appointment scheduled for a diabetic eye exam on August 18, 2014.   Results for orders placed or performed in visit on 07/26/14  POCT glycosylated hemoglobin (Hb A1C)  Result Value Ref Range   Hemoglobin A1C 8.1      Patient states that she has been very fatigue. Patient states that she believes her thyroid may be off. She would like to discuss this with the doctor. Patient claims compliance with her thyroid medicine.  Patient states that she has been experiencing right knee pain also. This has been present for 12-15 years now. Hx of left knee replacement with chronic arthritis. Pain is primarily in the right knee    patient compliant with blood pressure medicine. No obvious side effects. His cut salt down.  Review of Systems     No headache no chest pain no back pain abdominal pain no change in bowel habits blood in stool Objective:   Physical Exam  alert obesity present vital stable lungs clear. Heart regular rhythm H&T normal ankles trace edema       Assessment & Plan:   impression type 2 diabetes control suboptimal but improving #2 fatigue discuss ongoing. #3 hypothyroidism discussed #4 hypertension good control discussed #5 right knee arthritis progressive patient to think about potential referral to orthopedic surgeon plan May add Tylenol when necessary for knee pain. Compliance medicines discussed. Diet exercise discussed. Recheck in  several months. WSL

## 2014-07-28 ENCOUNTER — Other Ambulatory Visit: Payer: Self-pay | Admitting: Family Medicine

## 2014-08-07 ENCOUNTER — Encounter: Payer: Self-pay | Admitting: Adult Health

## 2014-08-07 ENCOUNTER — Ambulatory Visit (INDEPENDENT_AMBULATORY_CARE_PROVIDER_SITE_OTHER): Payer: Medicare Other | Admitting: Adult Health

## 2014-08-07 VITALS — BP 132/80 | HR 75 | Temp 98.3°F | Ht 64.0 in | Wt 270.0 lb

## 2014-08-07 DIAGNOSIS — E669 Obesity, unspecified: Secondary | ICD-10-CM | POA: Diagnosis not present

## 2014-08-07 DIAGNOSIS — G4733 Obstructive sleep apnea (adult) (pediatric): Secondary | ICD-10-CM

## 2014-08-07 NOTE — Assessment & Plan Note (Signed)
Doing well  Plan  Keep up good work with CPAP  Work on weight loss Will send order to try nasal pillows  follow up Dr. Elsworth Soho  In 4 months and As needed

## 2014-08-07 NOTE — Patient Instructions (Signed)
Keep up good work with CPAP  Work on weight loss Will send order to try nasal pillows  follow up Dr. Elsworth Soho  In 4 months and As needed

## 2014-08-07 NOTE — Assessment & Plan Note (Signed)
Wt loss  

## 2014-08-07 NOTE — Progress Notes (Signed)
   Subjective:    Patient ID: [name], female    DOB: 04-18-1955, 59 y.o.   MRN: [id number]  HPI 59 yo female seen for pulmonary consut  HST showed moderate OSA with AHI 25/hr   08/07/2014 Follow up OSA  Returns for follow up with OSA .  Doing well on CPAP  Says she really does not like it but will wear it.  Pt uses CPAP nightly, mask fits fine. Pt states CPAP gives her dry mouth.  Feels mask wakes her up with dryness and shifts .  We discussed nasal pillows and she would like to try.  Download shows good compliance with AHI 2.0, set pressure at 10cm  Min leaks. avg usage 6.5 hr .  No chest pain, orthopnea or increased edema.  No further heart issues . B/p control is improved.   Review of Systems  Constitutional:   No  weight loss, night sweats,  Fevers, chills, fatigue, or  lassitude.  HEENT:   No headaches,  Difficulty swallowing,  Tooth/dental problems, or  Sore throat,                No sneezing, itching, ear ache, nasal congestion, post nasal drip,   CV:  No chest pain,  Orthopnea, PND, swelling in lower extremities, anasarca, dizziness, palpitations, syncope.   GI  No heartburn, indigestion, abdominal pain, nausea, vomiting, diarrhea, change in bowel habits, loss of appetite, bloody stools.   Resp: No shortness of breath with exertion or at rest.  No excess mucus, no productive cough,  No non-productive cough,  No coughing up of blood.  No change in color of mucus.  No wheezing.  No chest wall deformity  Skin: no rash or lesions.  GU: no dysuria, change in color of urine, no urgency or frequency.  No flank pain, no hematuria   Psych:  No change in mood or affect. No depression or anxiety.  No memory loss.          Objective:   Physical Exam  GEN: A/Ox3; pleasant , NAD, obese   HEENT:  [place]/AT,  EACs-clear, TMs-wnl, NOSE-clear, THROAT-clear, no lesions, no postnasal drip or exudate noted. Class 2-3 airway   NECK:  Supple w/ fair ROM; no JVD; normal carotid  impulses w/o bruits; no thyromegaly or nodules palpated; no lymphadenopathy.  RESP  Clear  P & A; w/o, wheezes/ rales/ or rhonchi.no accessory muscle use, no dullness to percussion  CARD:  RRR, no m/r/g  , no peripheral edema, pulses intact, no cyanosis or clubbing.  GI:   Soft & nt; nml bowel sounds; no organomegaly or masses detected.  Musco: Warm bil, no deformities or joint swelling noted.   Neuro: alert, no focal deficits noted.    Skin: Warm, no lesions or rashes        Assessment & Plan:

## 2014-08-08 NOTE — Progress Notes (Signed)
Reviewed & agree with plan  

## 2014-08-31 ENCOUNTER — Other Ambulatory Visit: Payer: Self-pay | Admitting: *Deleted

## 2014-08-31 DIAGNOSIS — C50411 Malignant neoplasm of upper-outer quadrant of right female breast: Secondary | ICD-10-CM

## 2014-09-04 ENCOUNTER — Other Ambulatory Visit (HOSPITAL_BASED_OUTPATIENT_CLINIC_OR_DEPARTMENT_OTHER): Payer: Medicare Other

## 2014-09-04 ENCOUNTER — Telehealth: Payer: Self-pay | Admitting: Oncology

## 2014-09-04 ENCOUNTER — Ambulatory Visit (HOSPITAL_BASED_OUTPATIENT_CLINIC_OR_DEPARTMENT_OTHER): Payer: Medicare Other | Admitting: Oncology

## 2014-09-04 ENCOUNTER — Encounter: Payer: Self-pay | Admitting: Medical Oncology

## 2014-09-04 ENCOUNTER — Encounter: Payer: Self-pay | Admitting: Pulmonary Disease

## 2014-09-04 VITALS — BP 145/84 | HR 98 | Temp 99.1°F | Resp 18 | Ht 64.0 in | Wt 272.6 lb

## 2014-09-04 DIAGNOSIS — Z006 Encounter for examination for normal comparison and control in clinical research program: Secondary | ICD-10-CM

## 2014-09-04 DIAGNOSIS — C50411 Malignant neoplasm of upper-outer quadrant of right female breast: Secondary | ICD-10-CM

## 2014-09-04 DIAGNOSIS — C773 Secondary and unspecified malignant neoplasm of axilla and upper limb lymph nodes: Secondary | ICD-10-CM

## 2014-09-04 LAB — CBC WITH DIFFERENTIAL/PLATELET
BASO%: 0.9 % (ref 0.0–2.0)
Basophils Absolute: 0 10*3/uL (ref 0.0–0.1)
EOS%: 4.2 % (ref 0.0–7.0)
Eosinophils Absolute: 0.2 10*3/uL (ref 0.0–0.5)
HCT: 35.5 % (ref 34.8–46.6)
HGB: 11.4 g/dL — ABNORMAL LOW (ref 11.6–15.9)
LYMPH%: 27.6 % (ref 14.0–49.7)
MCH: 27.4 pg (ref 25.1–34.0)
MCHC: 32.2 g/dL (ref 31.5–36.0)
MCV: 84.9 fL (ref 79.5–101.0)
MONO#: 0.3 10*3/uL (ref 0.1–0.9)
MONO%: 7.8 % (ref 0.0–14.0)
NEUT#: 2.6 10*3/uL (ref 1.5–6.5)
NEUT%: 59.5 % (ref 38.4–76.8)
Platelets: 207 10*3/uL (ref 145–400)
RBC: 4.18 10*6/uL (ref 3.70–5.45)
RDW: 17.6 % — ABNORMAL HIGH (ref 11.2–14.5)
WBC: 4.4 10*3/uL (ref 3.9–10.3)
lymph#: 1.2 10*3/uL (ref 0.9–3.3)

## 2014-09-04 LAB — COMPREHENSIVE METABOLIC PANEL (CC13)
ALT: 18 U/L (ref 0–55)
ANION GAP: 14 meq/L — AB (ref 3–11)
AST: 17 U/L (ref 5–34)
Albumin: 3.8 g/dL (ref 3.5–5.0)
Alkaline Phosphatase: 88 U/L (ref 40–150)
BUN: 16.4 mg/dL (ref 7.0–26.0)
CHLORIDE: 107 meq/L (ref 98–109)
CO2: 23 meq/L (ref 22–29)
Calcium: 9.4 mg/dL (ref 8.4–10.4)
Creatinine: 1.2 mg/dL — ABNORMAL HIGH (ref 0.6–1.1)
EGFR: 58 mL/min/{1.73_m2} — AB (ref >90)
Glucose: 254 mg/dl — ABNORMAL HIGH (ref 70–140)
Potassium: 3.8 mEq/L (ref 3.5–5.1)
Sodium: 144 mEq/L (ref 136–145)
Total Bilirubin: 0.39 mg/dL (ref 0.20–1.20)
Total Protein: 6.9 g/dL (ref 6.4–8.3)

## 2014-09-04 NOTE — Progress Notes (Signed)
[facility] B-51 Met with patient today after her scheduled appointment with Dr. Jana Hakim. Informed patient of new 1 page [facility] Consent Form Addendum #1 for her consent. I reviewed the consent form with patient, paying specific attention to the new list of organizations added who may see patient's medical information. Inquired with patient if she had any questions regarding form, patient denied any questions and proceeded to sign and date form. I provided patient a copy of the signed consent form addendum #1 for her records and thanked her for her time and continued support of study. I also encouraged patient to contact Dr. Jana Hakim, Doristine Johns, RN  or myself for any questions or concerns she may have. All patients questions answered to her satisfaction.  Adele Dan, RN, BSN Clinical Research 09/04/2014 1:25 PM

## 2014-09-04 NOTE — Progress Notes (Signed)
[name]  [phone] Fax:(336) [phone]    ID: [name] OB: 10-May-1955  MR#: [id number]  SWF#:[id number]  PCP: Lacey Hillier, MD GYN:   SU: Lacey. Erroll Jackson OTHER MD: Lacey. Thea Jackson  CHIEF COMPLAINT:  Locally advanced breast cancer  TREATMENT: Observation  BREAST CANCER HISTORY: From [name] original intake note:  The patient herself noted a change in her right breast November 2014, but did not tell her family until after Christmas at the year. They "pretty much forced me" to have a mammogram and bilateral diagnostic mammography and right ultrasound 01/19/2013 at the breast Center showed an irregular mass associated with pleomorphic calcifications in the upper-outer quadrant of the right breast measuring 1.6 cm. There was an abnormal appearing right axillary lymph node. On physical exam, there was a movable firm palpable mass in the right breast measuring approximately 2 cm by palpation. Ultrasound showed this to be an irregularly marginated hypoechoic mass measuring 1.8 cm. In the right axilla the ultrasound showed a 1.3 cm abnormal appearing level I lymph node (loss a fatty hilum).  The 01/26/2013 the patient underwent biopsy of both the right breast mass in question and a suspicious right axillary lymph node. 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   Her subsequent history is as detailed below.  INTERVAL HISTORY: [name] returns to clinic today for follow up of her breast cancer, accompanied by her daughter [name]. She has been on anastrozole since December 2015 and is tolerating that well. She denies any problems with hot  flashes. She does have vaginal dryness issues but that was present prior to the start of anastrozole. She has significant arthritis problems but they localized to her knees. She does not elicit the diffuse arthralgias and myalgias the patients can experience on anastrozole.  REVIEW OF SYSTEMS: [name] does some house work and a little cooking but she is very limited because of her severe left knee dysfunction. Because of her difficulties walking and gait imbalance as a result the right kidney is now suffering. She had been previously evaluated by Lacey. Telford Jackson and more recently by Lacey. Alvan Jackson, who had suggested surgery. She got nervous however and postponed it. Now she wishes to be reevaluated by orthopedics. Aside from these issues a detailed review of systems today was stable  PAST MEDICAL HISTORY: Past Medical History  Diagnosis Date  . Hyperlipidemia   . Obesity   . Asthma     prn neb. and inhaler  . Osteoarthritis 2003    left knee  . Non-insulin dependent type 2 diabetes mellitus   . Graves' disease   . Ophthalmic manifestation of Graves disease   . Hypertension     on multiple meds., has been on med. > 14 yr.  . Dehydration 04/13/2013  . Breast cancer 01/2013    right  . Wears glasses   . GERD (gastroesophageal reflux disease)   . Radiation 10/11/13-11/29/13    Right Breast  . Sleep apnea 4/16    mod    PAST SURGICAL HISTORY: Past Surgical History  Procedure Laterality Date  . Total knee arthroplasty Left 2003  . Total thyroidectomy    . Colonoscopy  2007  . Tubal ligation  1985  . Colonoscopy w/ polypectomy  2009  . Portacath placement Right 02/17/2013    Procedure: INSERTION PORT-A-CATH;  Surgeon: Lacey Faster. Cornett, MD;  Location: [facility];  Service: General;  Laterality: Right;  . Tee without cardioversion N/A 11/08/2013    Procedure: TRANSESOPHAGEAL ECHOCARDIOGRAM (TEE);  Surgeon: Lelon Perla, MD;  Location: [facility];  Service: Cardiovascular;   Laterality: N/A;  . Cardioversion N/A 11/08/2013    Procedure: CARDIOVERSION;  Surgeon: Lelon Perla, MD;  Location: [place];  Service: Cardiovascular;  Laterality: N/A;  . Port-a-cath removal Right 05/18/2014    Procedure: REMOVAL PORT-A-CATH;  Surgeon: Lacey Luna, MD;  Location: [facility];  Service: General;  Laterality: Right;  . Excision of keloid N/A 05/18/2014    Procedure: EXCISION OF KELOID;  Surgeon: Lacey Luna, MD;  Location: [place];  Service: General;  Laterality: N/A;    FAMILY HISTORY Family History  Problem Relation Age of Onset  . Heart disease Father   . Lung cancer Father   . Diabetes Mother   . Diabetes Sister   . Hypertension Sister   . Asthma Sister    the patient's father died from lung cancer at the age of 25. The patient's mother died from thyroid cancer at the age of 56. The patient had 2 brothers, 2 sisters. There is no other history of breast or ovarian cancer in the family to her knowledge  GYN HISTORY: Menarche age 39, first live birth age 15. The patient is GX P2. She went through the change of life approximately age 60. She did not take hormone replacement.  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 She is now disabled secondary to her knee problems. She is divorced. At home she lives with her 2 daughters, [name] who works in direct supports to mentally retarded children, and [name], who is a group Games developer for mentally retarded children. The patient has no grandchildren. She attends a [employer]  ADVANCED DIRECTIVES: Not in place; these have been discussed with the patient, most recently in the 07/28/2013 visit.  HEALTH MAINTENANCE: Social History  Substance Use Topics  . Smoking status: Former Smoker -- 0.25 packs/day for 20 years    Quit date: 02/10/1991  . Smokeless tobacco: Never Used  . Alcohol Use: No      Allergies   Allergen Reactions  . Procardia [Nifedipine] Other (See Comments)    MIGRAINES  . Aspirin     Unable to take due to blood pressure medication  . Diltiazem Itching    Current Outpatient Prescriptions  Medication Sig Dispense Refill  . ACCU-CHEK SMARTVIEW test strip USE ONE TEST STRIP TO CHECK BLOOD SUGAR 4 TIMES DAILY 100 each 2  . acetaminophen (TYLENOL) 325 MG tablet Take 2 tablets (650 mg total) by mouth every 6 (six) hours as needed for mild pain or headache.    . albuterol (PROVENTIL HFA;VENTOLIN HFA) 108 (90 BASE) MCG/ACT inhaler Inhale 2 puffs into the lungs every 4 (four) hours as needed. 18 g 2  . amLODipine (NORVASC) 10 MG tablet TAKE ONE TABLET BY MOUTH ONCE DAILY. 90 tablet 1  . anastrozole (ARIMIDEX) 1 MG tablet Take 1 tablet (1 mg total) by mouth daily. 90 tablet 2  . Artificial Tear Ointment (ARTIFICIAL TEARS) ointment Place 1 drop into both eyes as needed (for grey eye disease).     . benazepril (LOTENSIN) 40 MG tablet TAKE  ONE TABLET BY MOUTH ONCE DAILY. 30 tablet 5  . calcium carbonate (TUMS EX) 750 MG chewable tablet Chew 2 tablets by mouth 4 (four) times daily.    . carvedilol (COREG) 12.5 MG tablet Take 1 tablet (12.5 mg total) by mouth 2 (two) times daily. 180 tablet 3  . cholecalciferol (VITAMIN D) 1000 UNITS tablet Take 1,000 Units by mouth daily.    Marland Kitchen ELIQUIS 5 MG TABS tablet TAKE (1) TABLET BY MOUTH TWICE DAILY. 60 tablet 3  . gabapentin (NEURONTIN) 100 MG capsule Take 1 capsule (100 mg total) by mouth 3 (three) times daily. 90 capsule 5  . levothyroxine (SYNTHROID, LEVOTHROID) 88 MCG tablet Take 1 tablet (88 mcg total) by mouth daily. 30 tablet 5  . loratadine (CLARITIN) 10 MG tablet Take 1 tablet (10 mg total) by mouth daily. 30 tablet 5  . metFORMIN (GLUCOPHAGE) 500 MG tablet Take 2 tablets (1,000 mg total) by mouth 2 (two) times daily with a meal. 120 tablet 5  . pravastatin (PRAVACHOL) 40 MG tablet Take 40 mg by mouth daily.    . vitamin B-12 (CYANOCOBALAMIN)  500 MCG tablet Take 500 mcg by mouth daily.     No current facility-administered medications for this visit.    OBJECTIVE: Middle-aged [place] American woman who appears stated age   20 Vitals:   09/04/14 1135  BP: 145/84  Pulse: 98  Temp: 99.1 F (37.3 C)  Resp: 18     Body mass index is 46.77 kg/(m^2).      ECOG: 2  Sclerae unicteric, EOMs intact Oropharynx clear, slightly dry No cervical or supraclavicular adenopathy Lungs no rales or rhonchi Heart regular rate and rhythm Abd soft, obese, nontender, positive bowel sounds MSK no focal spinal tenderness, no upper extremity lymphedema; ambulates with extreme difficulty because of knee problems Neuro: nonfocal, well oriented, appropriate affect Breasts: The right breast is status post lumpectomy and radiation. There is still hyperpigmentation and some skin thickening from the radiation. There is no evidence of disease recurrence. The right axilla is benign. The left breast is unremarkable. Marland Kitchen   LAB RESULTS:  CMP     Component Value Date/Time   NA 144 05/03/2014 0928   NA 145* 04/26/2014 0951   NA 139 12/29/2013 1209   K 4.1 05/03/2014 0928   K 4.1 04/26/2014 0951   CL 102 04/26/2014 0951   CO2 21* 05/03/2014 0928   CO2 25 04/26/2014 0951   GLUCOSE 161* 05/03/2014 0928   GLUCOSE 136* 04/26/2014 0951   GLUCOSE 144* 12/29/2013 1209   BUN 18.6 05/03/2014 0928   BUN 17 04/26/2014 0951   BUN 19 12/29/2013 1209   CREATININE 1.2* 05/03/2014 0928   CREATININE 1.08* 04/26/2014 0951   CREATININE 1.28* 05/06/2013 1149   CALCIUM 8.9 05/03/2014 0928   CALCIUM 9.0 04/26/2014 0951   CALCIUM 6.4* 05/08/2013 1215   PROT 7.0 05/03/2014 0928   PROT 7.1 04/26/2014 0951   PROT 6.7 11/04/2013 0513   ALBUMIN 3.7 05/03/2014 0928   ALBUMIN 3.4* 11/04/2013 0513   AST 17 05/03/2014 0928   AST 22 04/26/2014 0951   ALT 15 05/03/2014 0928   ALT 11 04/26/2014 0951   ALKPHOS 76 05/03/2014 0928   ALKPHOS 91 04/26/2014 0951   BILITOT  0.24 05/03/2014 0928   BILITOT 0.4 04/26/2014 0951   BILITOT 0.3 11/04/2013 0513   GFRNONAA 56* 04/26/2014 0951   GFRAA 65 04/26/2014 0951    I No results found for: SPEP  Lab Results  Component Value Date   WBC 4.4 09/04/2014   NEUTROABS 2.6 09/04/2014   HGB 11.4* 09/04/2014   HCT 35.5 09/04/2014   MCV 84.9 09/04/2014   PLT 207 09/04/2014      Chemistry      Component Value Date/Time   NA 144 05/03/2014 0928   NA 145* 04/26/2014 0951   NA 139 12/29/2013 1209   K 4.1 05/03/2014 0928   K 4.1 04/26/2014 0951   CL 102 04/26/2014 0951   CO2 21* 05/03/2014 0928   CO2 25 04/26/2014 0951   BUN 18.6 05/03/2014 0928   BUN 17 04/26/2014 0951   BUN 19 12/29/2013 1209   CREATININE 1.2* 05/03/2014 0928   CREATININE 1.08* 04/26/2014 0951   CREATININE 1.28* 05/06/2013 1149      Component Value Date/Time   CALCIUM 8.9 05/03/2014 0928   CALCIUM 9.0 04/26/2014 0951   CALCIUM 6.4* 05/08/2013 1215   ALKPHOS 76 05/03/2014 0928   ALKPHOS 91 04/26/2014 0951   AST 17 05/03/2014 0928   AST 22 04/26/2014 0951   ALT 15 05/03/2014 0928   ALT 11 04/26/2014 0951   BILITOT 0.24 05/03/2014 0928   BILITOT 0.4 04/26/2014 0951   BILITOT 0.3 11/04/2013 0513       No results found for: LABCA2  No components found for: [id number]  No results for input(s): INR in the last 168 hours.  Urinalysis    Component Value Date/Time   COLORURINE YELLOW 09/06/2013 1040   APPEARANCEUR CLEAR 09/06/2013 1040   LABSPEC 1.015 09/06/2013 1040   PHURINE 5.0 09/06/2013 1040   GLUCOSEU NEGATIVE 09/06/2013 1040   HGBUR NEGATIVE 09/06/2013 1040   BILIRUBINUR NEGATIVE 09/06/2013 1040   KETONESUR NEGATIVE 09/06/2013 1040   PROTEINUR NEGATIVE 09/06/2013 1040   UROBILINOGEN 0.2 09/06/2013 1040   NITRITE NEGATIVE 09/06/2013 1040   LEUKOCYTESUR NEGATIVE 09/06/2013 1040    STUDIES:  CLINICAL DATA: History of malignant lumpectomy of the right breast in July 2015. Patient underwent right breast radiation  therapy which was completed approximately 1 month ago. Follow-up evaluation.  EXAM: DIGITAL DIAGNOSTIC bilateral MAMMOGRAM WITH CAD  COMPARISON: 07/14/2013, 01/19/2013.  ACR Breast Density Category c: The breast tissue is heterogeneously dense, which may obscure small masses.  FINDINGS: There are post lumpectomy scarring changes present within the upper outer quadrant of the right breast with diffuse skin thickening and increased trabeculation within the right breast related to the patient's recent radiation therapy. The left breast parenchymal pattern is stable. There is no specific evidence for recurrent tumor or developing malignancy.  Mammographic images were processed with CAD.  IMPRESSION: Postlumpectomy changes and radiation therapy changes within the right breast. No findings worrisome for recurrent tumor or developing malignancy.  RECOMMENDATION: Bilateral diagnostic mammography in 1 year.  I have discussed the findings and recommendations with the patient. Results were also provided in writing at the conclusion of the visit. If applicable, a reminder letter will be sent to the patient regarding the next appointment.  BI-RADS CATEGORY 2: Benign.   Electronically Signed  By: Altamese Cabal M.D.  On: 03/14/2014 13:14    ASSESSMENT: 59 y.o. [name] woman   (1) status post right breast and right axillary lymph node biopsy 01/26/2013,  for a clinical T1c N1, stage IIA invasive ductal carcinoma, grade 3, estrogen and progesterone receptor positive, HER-2 amplified by immunohistochemistry (3+), with an MIB-1 of 70%.  (2) treated neo-adjuvantly with 4 of 6 planned cycles of docetaxel, carboplatin, trastuzumab,and pertuzumab, last dose 04/27/2013   (a) final  2 cycles of chemotherapy omitted in the face of multiple renal and electrolyte abnormalities    (b) pertuzumab and trastuzumab continued until 04/27/2013  (c) trastuzumab alone continued to  complete one year of anti-HER-2 treatment, last dose 04/12/2014  (d) echocardiogram 03/31/14 showing an ejection fraction in the 60-65% range  (3) status post right lumpectomy and sentinel lymph node sampling 07/19/2013 showing a complete pathologic response (ypT0 ypN0)  (4) adjuvant radiation to the breast completed on 11/29/13  (a) participating in RTOG 1304, randomized to no regional nodal XRT  (5) began anastrozole daily on 12/22/13  (a) DEXA scan 01/17/2014 showed a T score of -0.3 (normal).  PLAN: Branden is doing fine from a breast cancer point of view, now a year out from her definitive surgery with no evidence of disease recurrence. She is tolerating the anastrozole with no adverse events that I can determine.  She doesn't quite know what to do about her knee but it needs to be addressed, since she can barely walk from a chair to the examination table. I gave her the name of 2 orthopedists here in town, one of whom she has already seen, and she will discuss with her primary care physician which one he feels she should be reevaluated by. She will clearly need considerable rehabilitation after surgery  Otherwise she will see [place] again in 6 months. She knows to call for any problems that may develop before that visit.  Chauncey Cruel, MD

## 2014-09-11 ENCOUNTER — Encounter: Payer: Self-pay | Admitting: Oncology

## 2014-09-11 NOTE — Progress Notes (Signed)
NSABP B-51 Late entry - 09/04/2014 Patient into the cancer center for routine visit.  Patient completed her PROs.  I checked the PROs for completeness.  Patient was thanked for her continued support of this clinical trial. Barb Netha Dafoe 09/11/2014 9:00 AM

## 2014-09-12 ENCOUNTER — Encounter: Payer: Self-pay | Admitting: Family Medicine

## 2014-10-02 ENCOUNTER — Other Ambulatory Visit: Payer: Self-pay | Admitting: Family Medicine

## 2014-10-25 ENCOUNTER — Encounter: Payer: Self-pay | Admitting: Family Medicine

## 2014-10-25 ENCOUNTER — Ambulatory Visit (INDEPENDENT_AMBULATORY_CARE_PROVIDER_SITE_OTHER): Payer: Medicare Other | Admitting: Family Medicine

## 2014-10-25 VITALS — Ht 64.5 in | Wt 275.1 lb

## 2014-10-25 DIAGNOSIS — E119 Type 2 diabetes mellitus without complications: Secondary | ICD-10-CM | POA: Diagnosis not present

## 2014-10-25 DIAGNOSIS — E785 Hyperlipidemia, unspecified: Secondary | ICD-10-CM | POA: Diagnosis not present

## 2014-10-25 DIAGNOSIS — E039 Hypothyroidism, unspecified: Secondary | ICD-10-CM | POA: Diagnosis not present

## 2014-10-25 DIAGNOSIS — Z23 Encounter for immunization: Secondary | ICD-10-CM

## 2014-10-25 DIAGNOSIS — M25512 Pain in left shoulder: Secondary | ICD-10-CM | POA: Diagnosis not present

## 2014-10-25 DIAGNOSIS — Z79899 Other long term (current) drug therapy: Secondary | ICD-10-CM | POA: Diagnosis not present

## 2014-10-25 LAB — POCT GLYCOSYLATED HEMOGLOBIN (HGB A1C): Hemoglobin A1C: 8.3

## 2014-10-25 MED ORDER — GLIPIZIDE 5 MG PO TABS: 5.0000 mg | ORAL_TABLET | Freq: Every day | ORAL | Status: DC

## 2014-10-25 MED ORDER — METHYLPREDNISOLONE ACETATE 40 MG/ML IJ SUSP: 40.0000 mg | Freq: Once | INTRAMUSCULAR | Status: DC

## 2014-10-25 NOTE — Progress Notes (Signed)
   Subjective:    Patient ID: [name], female    DOB: 1955-07-19, 59 y.o.   MRN: [id number]  Diabetes She presents for her follow-up diabetic visit. She has type 2 diabetes mellitus. She has not had a previous visit with a dietitian. She does not see a podiatrist.Eye exam is current (August 2016).  no hypo spells.  Compliant with meds  Left shoulder progressive pain left shoulder, no sudden injury at the start.  Takes tylenol prn,  Diet overall better, sticking with chol med Hemoglobin A1c today is: 8.3   Patient would like to discuss  Bilateral shoulder pain. Left more so than right.   Patient compliant with lipid medication. Medications reviewed. No obvious side effects. Watching diet. Next  Patient compliant with thyroid medicine. We did adjust dose last time. No symptoms of high or low thyroid. Does not miss a dose.  Compliant with blood pressure medicine. No obvious side effects watching salt intake medications reviewed   Review of Systems No headache no chest pain no back pain no shortness of breath    Objective:   Physical Exam  Alert vital stable blood pressure good on repeat HEENT normal left shoulder positive impingement sign heart rare rhythm ankles trace edema  Procedure note patient was prepped draped anesthetized and injected 1 mL steroid 2 mL Xylocaine left shoulder posterior lateral approach    Assessment & Plan:  Impression 1 hypertension good control discussed #2 hyperlipidemia status uncertain check levels discussed #3 hypothyroidism status uncertain need to check levels. #4 type 2 diabetes A1c suboptimum discuss length will need to add medication #5 left shoulder impingement sign plan appropriate blood work. Flu shot. Range of motion exercises for shoulder. Further recommendations based on blood work results. WSL

## 2014-10-26 LAB — LIPID PANEL
CHOL/HDL RATIO: 2.6 ratio (ref 0.0–4.4)
Cholesterol, Total: 184 mg/dL (ref 100–199)
HDL: 71 mg/dL (ref >39)
LDL Calculated: 94 mg/dL (ref 0–99)
Triglycerides: 94 mg/dL (ref 0–149)
VLDL CHOLESTEROL CAL: 19 mg/dL (ref 5–40)

## 2014-10-26 LAB — TSH: TSH: 0.972 u[IU]/mL (ref 0.450–4.500)

## 2014-10-26 LAB — HEPATIC FUNCTION PANEL
ALBUMIN: 4.8 g/dL (ref 3.6–4.8)
ALK PHOS: 93 IU/L (ref 39–117)
ALT: 14 IU/L (ref 0–32)
AST: 20 IU/L (ref 0–40)
Bilirubin Total: 0.4 mg/dL (ref 0.0–1.2)
Bilirubin, Direct: 0.13 mg/dL (ref 0.00–0.40)
TOTAL PROTEIN: 7.8 g/dL (ref 6.0–8.5)

## 2014-10-31 ENCOUNTER — Encounter: Payer: Self-pay | Admitting: Family Medicine

## 2014-11-01 ENCOUNTER — Other Ambulatory Visit: Payer: Self-pay | Admitting: *Deleted

## 2014-11-01 ENCOUNTER — Other Ambulatory Visit: Payer: Self-pay | Admitting: Family Medicine

## 2014-11-01 DIAGNOSIS — C50411 Malignant neoplasm of upper-outer quadrant of right female breast: Secondary | ICD-10-CM

## 2014-11-01 MED ORDER — GABAPENTIN 100 MG PO CAPS: 100.0000 mg | ORAL_CAPSULE | Freq: Three times a day (TID) | ORAL | Status: DC

## 2014-11-03 ENCOUNTER — Telehealth: Payer: Self-pay | Admitting: Family Medicine

## 2014-11-03 MED ORDER — ALBUTEROL SULFATE HFA 108 (90 BASE) MCG/ACT IN AERS: 2.0000 | INHALATION_SPRAY | RESPIRATORY_TRACT | Status: DC | PRN

## 2014-11-03 MED ORDER — HYDROCODONE-HOMATROPINE 5-1.5 MG/5ML PO SYRP: ORAL_SOLUTION | ORAL | Status: DC

## 2014-11-03 NOTE — Telephone Encounter (Signed)
Called patient to get a more detail information on patient's symptoms. Patient states that she has some sinus drainage, with throat irritation and non productive cough that occurs every few minutes, no fever. Requesting a cough syrup if possible and a refill on her inhaler. Please advise?

## 2014-11-03 NOTE — Telephone Encounter (Signed)
Called patient and informed her per Dr.Steve Luking- that we are sending in refill on ventolin inhaler and Hycodan cough syrup. Patient verbalized understanding.

## 2014-11-03 NOTE — Telephone Encounter (Signed)
Pt is needing a new prescription for her albuterol inhaler. Pt is also wanting a prescription for cough syrup.   [place]

## 2014-11-03 NOTE — Telephone Encounter (Signed)
Ok vent mdi two sprays qid frn, hycodan three oz one tspn qhs prn cough

## 2014-11-06 ENCOUNTER — Other Ambulatory Visit: Payer: Self-pay | Admitting: Family Medicine

## 2014-11-06 ENCOUNTER — Other Ambulatory Visit: Payer: Self-pay | Admitting: Cardiology

## 2014-11-28 IMAGING — CT NM PET TUM IMG INITIAL (PI) SKULL BASE T - THIGH
1 of 7 series · 1 of 25 positions shown · non-contrast
Comparison: US CORE BIOPSY dated 01/26/2013; CT CHEST W/CM dated
02/25/2013; CT ABD/PELVIS W CM dated 02/25/2013

CLINICAL DATA: Initial treatment strategy for OS6-P positive, node
positive right breast cancer.

EXAM:
NUCLEAR MEDICINE PET SKULL BASE TO THIGH
FASTING BLOOD GLUCOSE:  Value: 189 mg/dl
TECHNIQUE: 12.2 mCi F-18 FDG was injected intravenously. Full-ring PET imaging
was performed from the skull base to thigh after the radiotracer. CT
data was obtained and used for attenuation correction and anatomic
localization.

[Series 3: pet sk_thigh ac · axial · 5.0mm · 4.07mm/px · 1 of 201 slices shown]
[im 101/201]
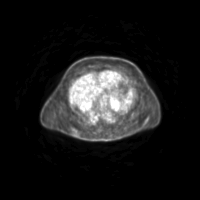

[1 of 25 positions shown; findings below may reference images not displayed]

FINDINGS: NECK

No hypermetabolic cervical lymph nodes are identified.There are no
lesions of the pharyngeal mucosal space.

CHEST

The approximately 1.7 cm mass laterally in the right breast on image
67 is hypermetabolic with SUV max 10.32. Small nodes inferiorly in
the right axilla are also mildly hypermetabolic with an SUV max of
2.67. There are no hypermetabolic internal mammary, mediastinal or
hilar lymph nodes. There is no hypermetabolic pulmonary activity. CT
findings are deferred to the diagnostic study of the same date.

ABDOMEN/PELVIS

There is no hypermetabolic activity within the liver, adrenal
glands, spleen or pancreas. There is no hypermetabolic nodal
activity. CT findings are deferred to the diagnostic study of the
same date.

SKELETON

There is no hypermetabolic activity to suggest osseous metastatic
disease.
IMPRESSION: 1. The right lateral breast cancer is hypermetabolic. There are
small right axillary lymph nodes which are also hypermetabolic,
suspicious for nodal metastases.
2. No evidence of distant/hematogenous metastatic disease.

## 2014-11-28 IMAGING — CT CT CHEST W/ CM
2 of 5 series · 16 of 46 positions shown, 18 images · IV contrast (OMNIPAQUE)
Comparison: PET-CT performed today.

CLINICAL DATA: New diagnosis of right breast cancer. Chemotherapy
in progress.

EXAM:
CT CHEST, ABDOMEN, AND PELVIS WITH CONTRAST
TECHNIQUE: Multidetector CT imaging of the chest, abdomen and pelvis was
performed following the standard protocol during bolus
administration of intravenous contrast.
CONTRAST:  100mL OMNIPAQUE IOHEXOL 300 MG/ML  SOLN

[Series 2: cap with st · axial · 0.77mm/px · z∈[-581,-71]mm · 13 of 116 slices shown, 15 images]
[im 7/116  soft-tissue]
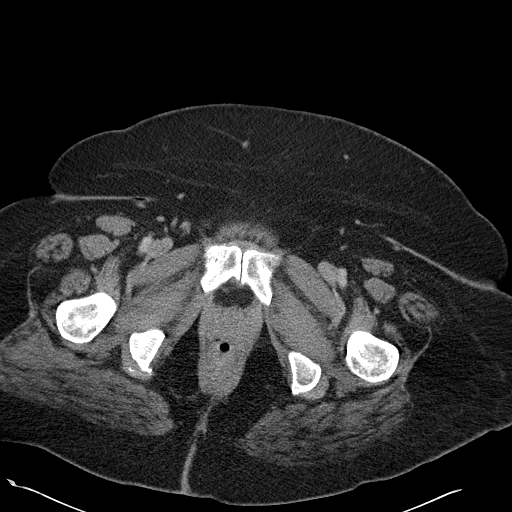
[im 7/116  bone]
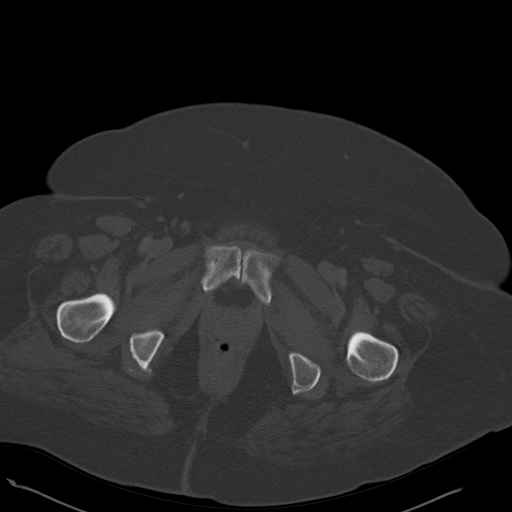
[im 13/116  soft-tissue]
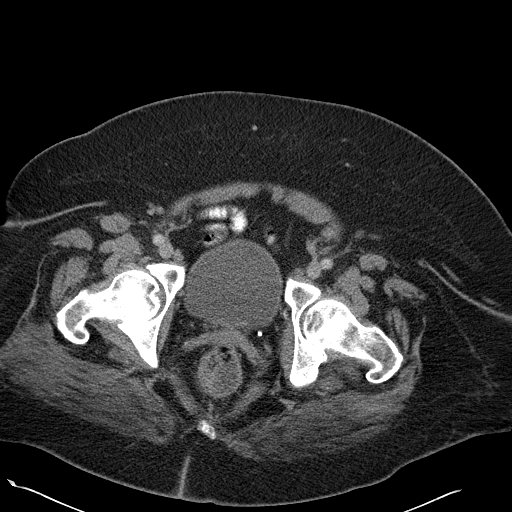
[im 26/116  soft-tissue]
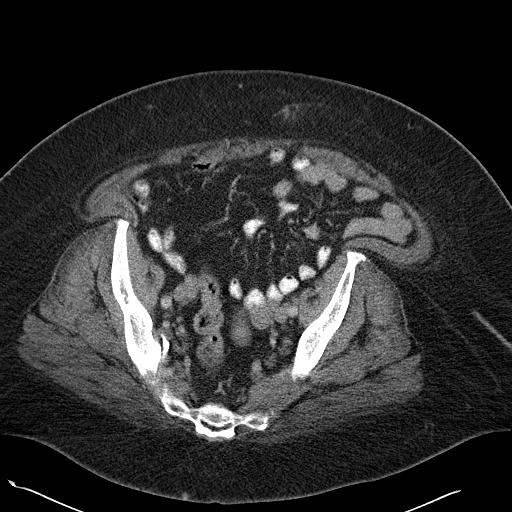
[im 32/116  soft-tissue]
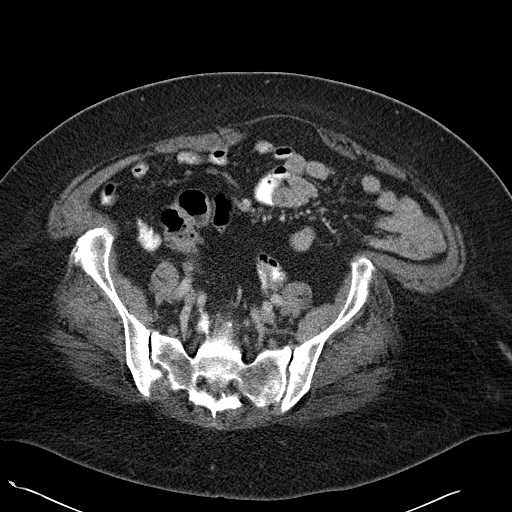
[im 39/116  soft-tissue]
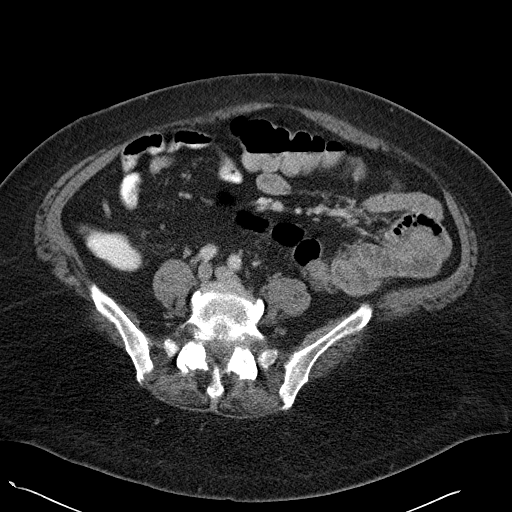
[im 52/116  soft-tissue]
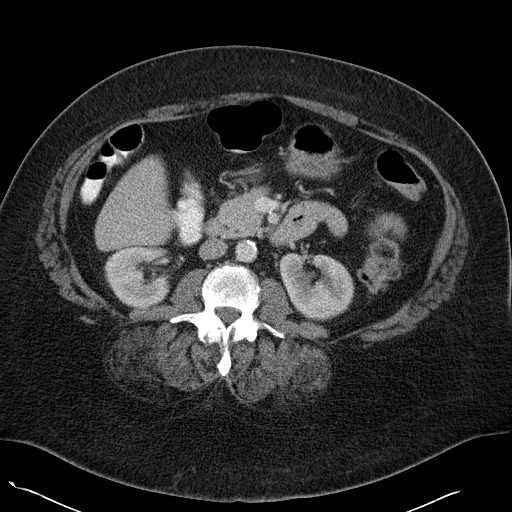
[im 58/116  soft-tissue]
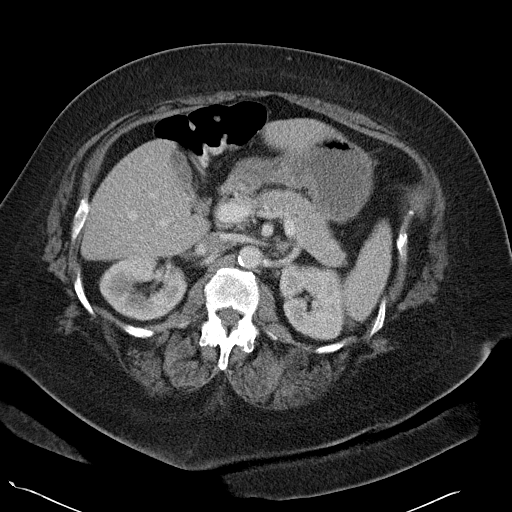
[im 64/116  soft-tissue]
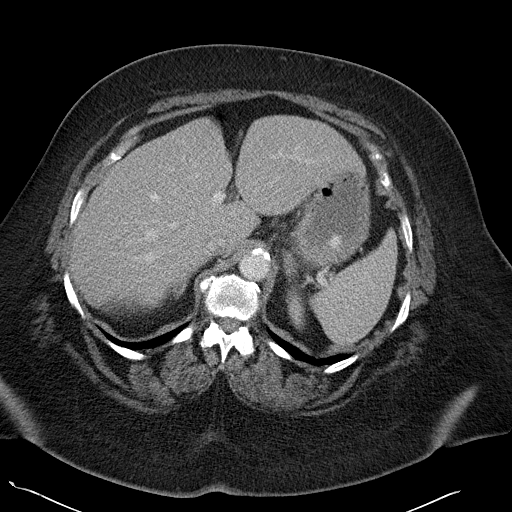
[im 77/116  soft-tissue]
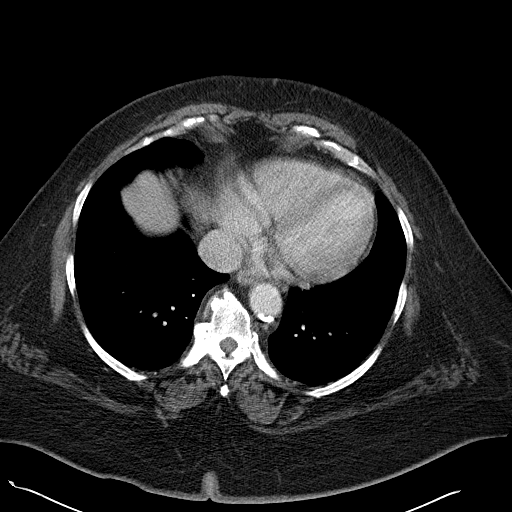
[im 77/116  bone]
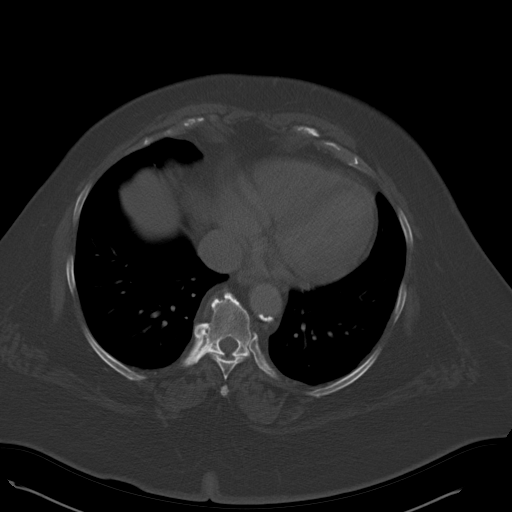
[im 84/116  soft-tissue]
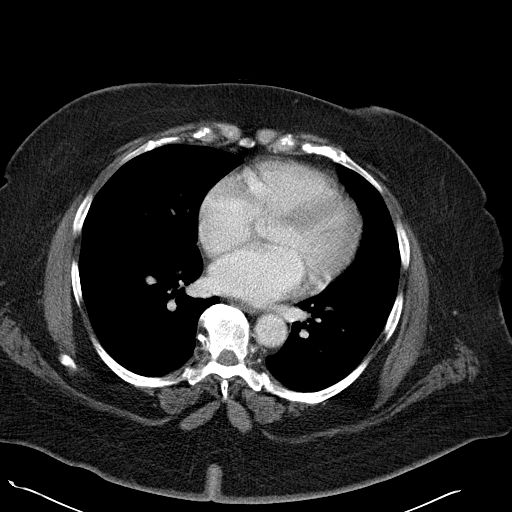
[im 90/116  soft-tissue]
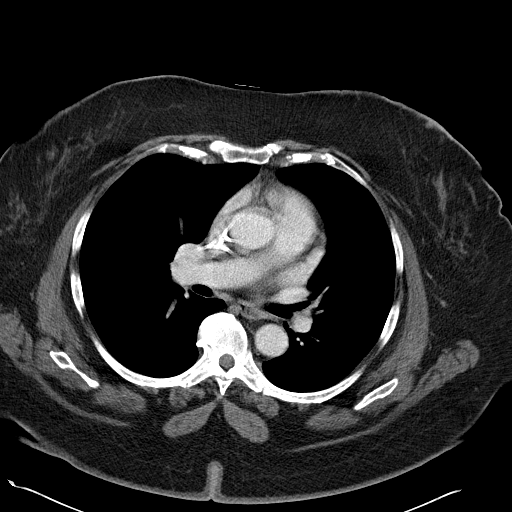
[im 103/116  soft-tissue]
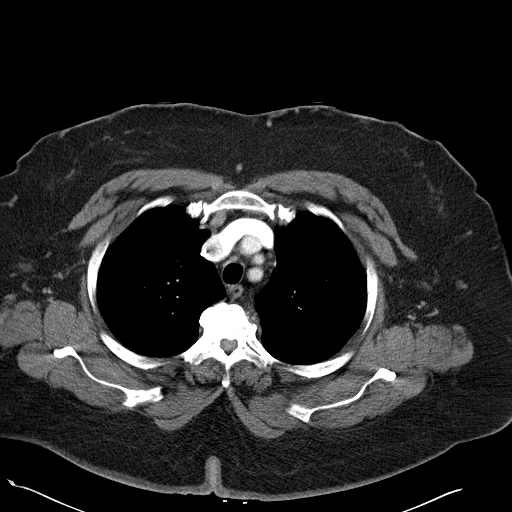
[im 109/116  soft-tissue]
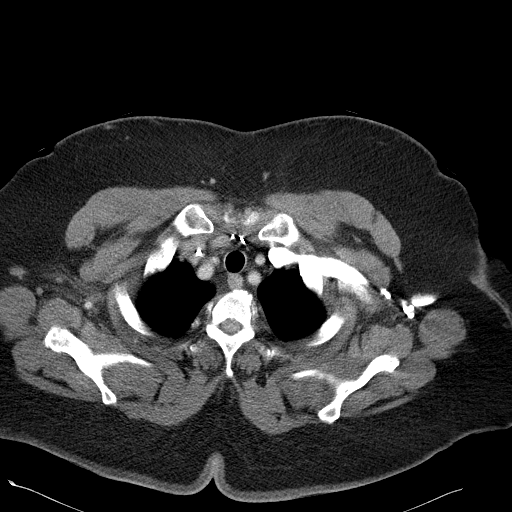

[Series 602: <mpr thick range> · coronal · 1.13mm/px · 3 of 99 slices shown]
[im 33/99  soft-tissue]
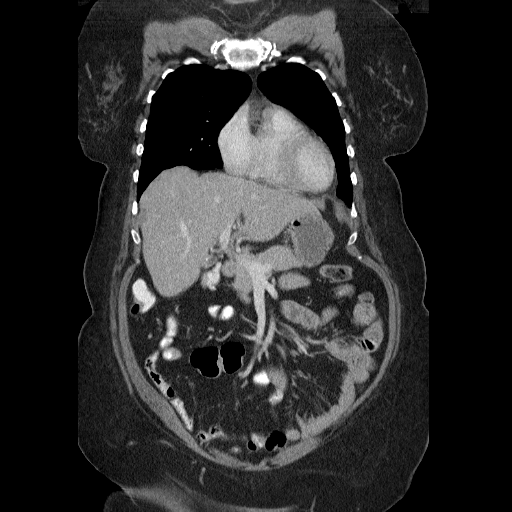
[im 44/99  soft-tissue]
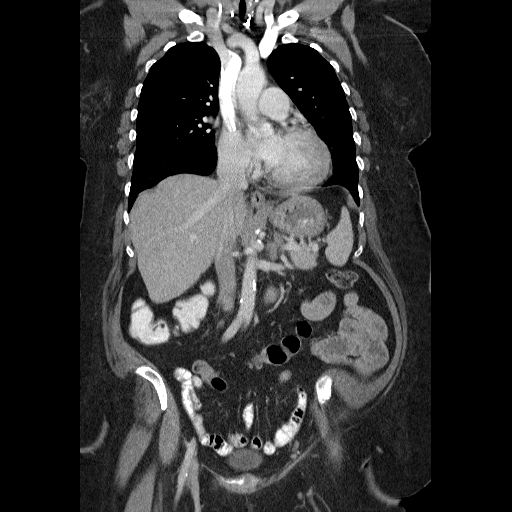
[im 55/99  soft-tissue]
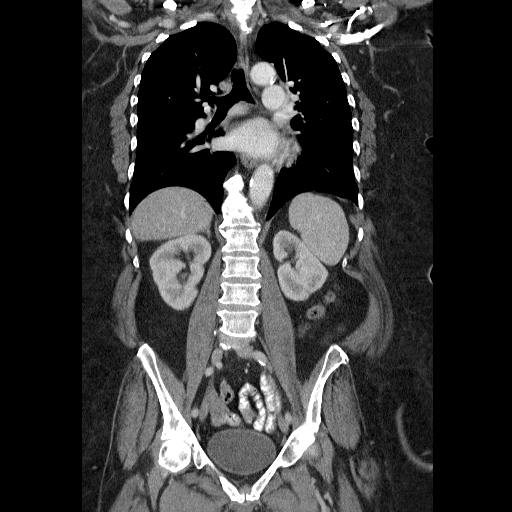

[16 of 46 positions shown; findings below may reference images not displayed]

FINDINGS: CT CHEST FINDINGS

There is a biopsy clip within a 1.6 cm mass laterally in the right
breast on image 19. Small adjacent inferior right axillary lymph
nodes are not pathologically enlarged, although are mildly
hypermetabolic on today's PET-CT. These include a 10 mm node on
image 17 and an 8 mm node on image 19.

There are no enlarged internal mammary, mediastinal or hilar lymph
nodes. Postsurgical changes are noted at the thoracic inlet
consistent with prior thyroid resection. There is a right IJ central
venous catheter with its tip at the SVC right atrial junction.

There is mild atherosclerosis of the aorta and coronary arteries.
The heart size is normal. There is no pleural or pericardial
effusion.

The lungs are clear.  There are no worrisome osseous findings.

CT ABDOMEN AND PELVIS FINDINGS

The liver appears normal without evidence of metastatic disease.
There are small calcified gallstones within the gallbladder lumen.
The spleen, pancreas and adrenal glands appear normal. There is a
1.5 cm parapelvic cyst inferiorly in the left kidney. The right
kidney appears normal. There is no hydronephrosis.

The stomach, small bowel, appendix and colon demonstrate no
significant findings. There are no enlarged abdominal pelvic lymph
nodes. There is mild atherosclerosis of the aorta and iliac
arteries.

The uterus is lobulated with small calcifications consistent with
degenerated fibroids. There is no adnexal mass. The bladder appears
normal. There is a large phlebolith or calcified lymph node
inferiorly in the left pelvis.

There are no worrisome osseous findings. There are degenerative
changes throughout the spine. In addition, there are degenerative
changes at the hips and sacroiliac joints bilaterally.
IMPRESSION: 1. Biopsy proven right breast cancer with probable small adjacent
nodal metastases.
2. No distant/hematogenous metastatic disease identified.
3. Cholelithiasis, left renal parapelvic cyst and uterine fibroids
noted.
4. Degenerative changes in the spine, hips and sacroiliac joints.

## 2014-12-01 ENCOUNTER — Other Ambulatory Visit: Payer: Self-pay | Admitting: Internal Medicine

## 2014-12-20 ENCOUNTER — Other Ambulatory Visit: Payer: Self-pay | Admitting: *Deleted

## 2014-12-20 ENCOUNTER — Telehealth: Payer: Self-pay | Admitting: *Deleted

## 2014-12-20 DIAGNOSIS — C50011 Malignant neoplasm of nipple and areola, right female breast: Secondary | ICD-10-CM

## 2014-12-20 MED ORDER — ANASTROZOLE 1 MG PO TABS: 1.0000 mg | ORAL_TABLET | Freq: Every day | ORAL | Status: DC

## 2014-12-20 NOTE — Telephone Encounter (Signed)
"  [name] has faxed refill for my anastrozole with no response.  May I have this refilled?"  This nurse will send refill to [facility].

## 2015-01-03 ENCOUNTER — Other Ambulatory Visit: Payer: Self-pay | Admitting: Cardiology

## 2015-01-17 ENCOUNTER — Other Ambulatory Visit: Payer: Self-pay | Admitting: Family Medicine

## 2015-01-19 ENCOUNTER — Encounter (HOSPITAL_COMMUNITY): Payer: Medicare Other

## 2015-01-19 ENCOUNTER — Ambulatory Visit (HOSPITAL_COMMUNITY): Admission: RE | Admit: 2015-01-19 | Payer: Medicare Other | Source: Ambulatory Visit

## 2015-01-25 ENCOUNTER — Ambulatory Visit: Payer: Medicare Other | Admitting: Family Medicine

## 2015-01-25 ENCOUNTER — Other Ambulatory Visit: Payer: Self-pay | Admitting: Family Medicine

## 2015-02-02 ENCOUNTER — Other Ambulatory Visit: Payer: Self-pay | Admitting: Cardiology

## 2015-02-15 ENCOUNTER — Ambulatory Visit (HOSPITAL_COMMUNITY)
Admission: RE | Admit: 2015-02-15 | Discharge: 2015-02-15 | Disposition: A | Discharge status: Home / Self Care | Payer: Medicare Other | Source: Ambulatory Visit | Attending: Family Medicine | Admitting: Family Medicine

## 2015-02-15 ENCOUNTER — Ambulatory Visit (HOSPITAL_BASED_OUTPATIENT_CLINIC_OR_DEPARTMENT_OTHER)
Admission: RE | Admit: 2015-02-15 | Discharge: 2015-02-15 | Disposition: A | Discharge status: Home / Self Care | Payer: Medicare Other | Source: Ambulatory Visit | Attending: Internal Medicine | Admitting: Internal Medicine

## 2015-02-15 VITALS — BP 146/70 | HR 90 | Wt 279.8 lb

## 2015-02-15 DIAGNOSIS — E785 Hyperlipidemia, unspecified: Secondary | ICD-10-CM | POA: Diagnosis not present

## 2015-02-15 DIAGNOSIS — J45909 Unspecified asthma, uncomplicated: Secondary | ICD-10-CM | POA: Insufficient documentation

## 2015-02-15 DIAGNOSIS — Z79899 Other long term (current) drug therapy: Secondary | ICD-10-CM | POA: Insufficient documentation

## 2015-02-15 DIAGNOSIS — Z825 Family history of asthma and other chronic lower respiratory diseases: Secondary | ICD-10-CM | POA: Insufficient documentation

## 2015-02-15 DIAGNOSIS — Z886 Allergy status to analgesic agent status: Secondary | ICD-10-CM | POA: Diagnosis not present

## 2015-02-15 DIAGNOSIS — Z923 Personal history of irradiation: Secondary | ICD-10-CM | POA: Insufficient documentation

## 2015-02-15 DIAGNOSIS — Z7902 Long term (current) use of antithrombotics/antiplatelets: Secondary | ICD-10-CM | POA: Diagnosis not present

## 2015-02-15 DIAGNOSIS — C50411 Malignant neoplasm of upper-outer quadrant of right female breast: Secondary | ICD-10-CM | POA: Diagnosis not present

## 2015-02-15 DIAGNOSIS — I10 Essential (primary) hypertension: Secondary | ICD-10-CM | POA: Diagnosis not present

## 2015-02-15 DIAGNOSIS — Z170 Estrogen receptor positive status [ER+]: Secondary | ICD-10-CM | POA: Diagnosis not present

## 2015-02-15 DIAGNOSIS — Z888 Allergy status to other drugs, medicaments and biological substances status: Secondary | ICD-10-CM | POA: Diagnosis not present

## 2015-02-15 DIAGNOSIS — K219 Gastro-esophageal reflux disease without esophagitis: Secondary | ICD-10-CM | POA: Insufficient documentation

## 2015-02-15 DIAGNOSIS — Z87891 Personal history of nicotine dependence: Secondary | ICD-10-CM | POA: Diagnosis not present

## 2015-02-15 DIAGNOSIS — G4730 Sleep apnea, unspecified: Secondary | ICD-10-CM | POA: Diagnosis not present

## 2015-02-15 DIAGNOSIS — E119 Type 2 diabetes mellitus without complications: Secondary | ICD-10-CM | POA: Insufficient documentation

## 2015-02-15 DIAGNOSIS — Z8249 Family history of ischemic heart disease and other diseases of the circulatory system: Secondary | ICD-10-CM | POA: Insufficient documentation

## 2015-02-15 DIAGNOSIS — Z7984 Long term (current) use of oral hypoglycemic drugs: Secondary | ICD-10-CM | POA: Diagnosis not present

## 2015-02-15 DIAGNOSIS — I4892 Unspecified atrial flutter: Secondary | ICD-10-CM | POA: Diagnosis not present

## 2015-02-15 DIAGNOSIS — Z833 Family history of diabetes mellitus: Secondary | ICD-10-CM | POA: Diagnosis not present

## 2015-02-15 NOTE — Progress Notes (Signed)
Patient ID: [name], female   DOB: 13-Mar-1955, 60 y.o.   MRN: [id number]   PCP: [facility] Oncologist: Dr. Jana Hakim Patient ID: [name], female   DOB: 1955/12/10, 60 y.o.   MRN: [id number]    HPI:  [name] is a 60 y.o. female. 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 The biopsied right lymph node was positive for metastatic disease. She is followed in the Cardio-Oncology clinic for monitoring during Herceptin therapy.   Medical hx notable for morbid obesity, DM2, Graves disease, severe HTN, tobacco abuse (q 1990). Had cardiac cath in 1995 which showed normal coronaries. Lexiscan 2/14: Normal  Completed 6 cycles  Taxotere carboplatinum Herceptin/perjeta (started 02/23/13)  Completed  Herceptin/Perjeta every 3 weeks - completed in 2016.   Follow-up  Visit: Says she feels fine. No SOB, orthopnea or PND. No edema. Walking with a walker and able to go from garage to clinic with no issues.   ECHO 02/21/13: LVEF 60-65% Grade 1 DD.  lateral s' 10.3 cm/s. GLS -18.4 ECHO 6//29/15 EF 60-65% lat s' 10.9 GLS 20.3  ECHO 9/15: EF 60-65% lat s' not measured GLS -21% ECHO 02/14/14: EF 60-65% lateral s' 10.8 cm/s GLS -16.2% (underestimated due too poor tracking)   Past Medical History  Diagnosis Date  . Hyperlipidemia   . Obesity   . Asthma     prn neb. and inhaler  . Osteoarthritis 2003    left knee  . Non-insulin dependent type 2 diabetes mellitus ([place])   . Graves' disease   . Ophthalmic manifestation of Graves disease   . Hypertension     on multiple meds., has been on med. > 14 yr.  . Dehydration 04/13/2013  . Breast cancer ([place]) 01/2013    right  . Wears glasses   . GERD (gastroesophageal reflux disease)   . Radiation 10/11/13-11/29/13    Right Breast  . Sleep apnea 4/16    mod    Current Outpatient Prescriptions  Medication Sig Dispense Refill  . ACCU-CHEK SMARTVIEW test strip USE ONE  TEST STRIP TO CHECK BLOOD SUGAR 4 TIMES DAILY 100 each 5  . acetaminophen (TYLENOL) 325 MG tablet Take 2 tablets (650 mg total) by mouth every 6 (six) hours as needed for mild pain or headache.    . albuterol (PROVENTIL HFA;VENTOLIN HFA) 108 (90 BASE) MCG/ACT inhaler Inhale 2 puffs into the lungs every 4 (four) hours as needed. 18 g 2  . amLODipine (NORVASC) 10 MG tablet TAKE ONE TABLET BY MOUTH ONCE DAILY. 90 tablet 0  . anastrozole (ARIMIDEX) 1 MG tablet Take 1 tablet (1 mg total) by mouth daily. 90 tablet 0  . Artificial Tear Ointment (ARTIFICIAL TEARS) ointment Place 1 drop into both eyes as needed (for grey eye disease).     . benazepril (LOTENSIN) 40 MG tablet TAKE ONE TABLET BY MOUTH ONCE DAILY. 30 tablet 5  . Biotin 1000 MCG tablet Take 1,000 mcg by mouth daily.    . calcium carbonate (TUMS EX) 750 MG chewable tablet Chew 2 tablets by mouth 4 (four) times daily.    . carvedilol (COREG) 12.5 MG tablet TAKE (1) TABLET BY MOUTH TWICE DAILY. 180 tablet 0  . cholecalciferol (VITAMIN D) 1000 UNITS tablet Take 1,000 Units by mouth daily.    Marland Kitchen ELIQUIS 5 MG TABS tablet TAKE (1) TABLET BY MOUTH TWICE DAILY. 60 tablet 3  . gabapentin (NEURONTIN) 100 MG capsule Take  1 capsule (100 mg total) by mouth 3 (three) times daily. 90 capsule 5  . levothyroxine (SYNTHROID, LEVOTHROID) 88 MCG tablet TAKE ONE TABLET BY MOUTH ONCE DAILY. 30 tablet 4  . loratadine (CLARITIN) 10 MG tablet Take 1 tablet (10 mg total) by mouth daily. 30 tablet 5  . metFORMIN (GLUCOPHAGE) 500 MG tablet Take 2 tablets (1,000 mg total) by mouth 2 (two) times daily with a meal. 120 tablet 5  . pravastatin (PRAVACHOL) 80 MG tablet Take 40 mg by mouth daily.     Current Facility-Administered Medications  Medication Dose Route Frequency Provider Last Rate Last Dose  . methylPREDNISolone acetate (DEPO-MEDROL) injection 40 mg  40 mg Intra-articular Once Mikey Kirschner, MD        Allergies  Allergen Reactions  . Procardia [Nifedipine]  Other (See Comments)    MIGRAINES  . Aspirin     Unable to take due to blood pressure medication  . Diltiazem Itching    Social History   Social History  . Marital Status: Divorced    Spouse Name: N/A  . Number of Children: 2  . Years of Education: N/A   Occupational History  . Disability    Social History Main Topics  . Smoking status: Former Smoker -- 0.25 packs/day for 20 years    Quit date: 02/10/1991  . Smokeless tobacco: Never Used  . Alcohol Use: No  . Drug Use: No  . Sexual Activity: No   Other Topics Concern  . Not on file   Social History Narrative   Lives in [place]          Family History  Problem Relation Age of Onset  . Heart disease Father   . Lung cancer Father   . Diabetes Mother   . Diabetes Sister   . Hypertension Sister   . Asthma Sister      Danley Danker Vitals:   02/15/15 1116  BP: 146/70  Pulse: 90  Weight: 279 lb 12 oz (126.894 kg)  SpO2: 98%   PHYSICAL EXAM: General:  Well appearing. No respiratory difficulty Daughter present  HEENT: normal x for exophthalmosis  Neck: supple. no JVD. Carotids 2+ bilat; no bruits. No lymphadenopathy or thryomegaly appreciated. Cor: PMI nondisplaced. Regular rate & rhythm. No rubs, gallops or murmurs. Keloid noted central aspect of her chest.  Lungs: clear Abdomen: obese soft, nontender, nondistended. No hepatosplenomegaly. No bruits or masses. Good bowel sounds. Extremities: no cyanosis, clubbing, rash, tr edema Neuro: alert & oriented x 3, cranial nerves grossly intact. moves all 4 extremities w/o difficulty. Affect pleasant.  EKG: NSR 75 bpm  ASSESSMENT & PLAN: 1. R breast cancer - completed Herceptin 3/16.  Echo images reviewed personally. All parameters stable.  2. HTN  - slightly elevated in setting of not taking meds this am. Can follow with PCP. 3. 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 Continue Eliquis, no bleeding issues.  If recurs can consider ablation 4. DM2  - Followed by Dr. Wolfgang Phoenix.     Glori Bickers MD  11:43 AM

## 2015-02-15 NOTE — Addendum Note (Signed)
Encounter addended by: Scarlette Calico, RN on: 02/15/2015 11:59 AM<BR>     Documentation filed: Patient Instructions Section

## 2015-02-15 NOTE — Patient Instructions (Signed)
Follow up as needed

## 2015-02-15 NOTE — Progress Notes (Signed)
  Echocardiogram 2D Echocardiogram has been performed.  [name] M 02/15/2015, 11:07 AM

## 2015-02-22 ENCOUNTER — Encounter: Payer: Self-pay | Admitting: Family Medicine

## 2015-02-22 ENCOUNTER — Ambulatory Visit (INDEPENDENT_AMBULATORY_CARE_PROVIDER_SITE_OTHER): Payer: Medicare Other | Admitting: Family Medicine

## 2015-02-22 VITALS — BP 130/86 | Ht 64.5 in | Wt 281.4 lb

## 2015-02-22 DIAGNOSIS — E785 Hyperlipidemia, unspecified: Secondary | ICD-10-CM | POA: Diagnosis not present

## 2015-02-22 DIAGNOSIS — I4892 Unspecified atrial flutter: Secondary | ICD-10-CM

## 2015-02-22 DIAGNOSIS — I10 Essential (primary) hypertension: Secondary | ICD-10-CM | POA: Diagnosis not present

## 2015-02-22 DIAGNOSIS — E119 Type 2 diabetes mellitus without complications: Secondary | ICD-10-CM | POA: Diagnosis not present

## 2015-02-22 LAB — POCT GLYCOSYLATED HEMOGLOBIN (HGB A1C): Hemoglobin A1C: 9.1

## 2015-02-22 MED ORDER — BENAZEPRIL HCL 40 MG PO TABS: 40.0000 mg | ORAL_TABLET | Freq: Every day | ORAL | Status: DC

## 2015-02-22 MED ORDER — LEVOTHYROXINE SODIUM 88 MCG PO TABS: 88.0000 ug | ORAL_TABLET | Freq: Every day | ORAL | Status: DC

## 2015-02-22 MED ORDER — METFORMIN HCL 500 MG PO TABS: 1000.0000 mg | ORAL_TABLET | Freq: Two times a day (BID) | ORAL | Status: DC

## 2015-02-22 MED ORDER — CANAGLIFLOZIN 100 MG PO TABS: 100.0000 mg | ORAL_TABLET | Freq: Every day | ORAL | Status: DC

## 2015-02-22 MED ORDER — AMLODIPINE BESYLATE 10 MG PO TABS: 10.0000 mg | ORAL_TABLET | Freq: Every day | ORAL | Status: DC

## 2015-02-22 NOTE — Progress Notes (Signed)
   Subjective:    Patient ID: [name], female    DOB: 07/11/1955, 60 y.o.   MRN: [id number]  Diabetes She presents for her follow-up diabetic visit. She has type 2 diabetes mellitus. Risk factors for coronary artery disease include diabetes mellitus, dyslipidemia, hypertension, obesity and post-menopausal. Current diabetic treatment includes oral agent (monotherapy). She is compliant with treatment all of the time. Her weight is stable. She is following a diabetic diet. She has not had a previous visit with a dietitian. She does not see a podiatrist.Eye exam is current.   Results for orders placed or performed in visit on 02/22/15  POCT glycosylated hemoglobin (Hb A1C)  Result Value Ref Range   Hemoglobin A1C 9.1     Patient stopped glipizide because it was dropping her sugars too low, numbers were often fdropping low to. Notes now that sugars are rising most mornings greater than 150.  Compliant with bp meds does not miss a dose. No obvious side effects. Blood pressure generally good when checked elsewhere.  Followed by cardiologist. Patient states her cardiologist told her that now he no longer needs to see her and we can prescribe her [place] Spanish  Sticking with chol nmeds  Review of Systems Positive fatigue ongoing weight gain no headache no chest pain no shortness of breath    Objective:   Physical Exam  Alert vitals stable blood pressure good on repeat HEENT normal lungs clear heart rare irregular  Rhythm but controlled rate ankles trace edema      Assessment & Plan:  Impression 1 hypertension good control meds reviewed to maintain same #2 atrial flutter also good control #3 type 2 diabetes poor control inability to take glipizide No. 4 hyperlipidemia plan meds refilled. Diet exercise discussed. Initiate info, rationale discussed plan as noted above

## 2015-03-01 ENCOUNTER — Other Ambulatory Visit: Payer: Self-pay | Admitting: Family Medicine

## 2015-03-01 NOTE — Telephone Encounter (Signed)
The last time patient saw her heart specialist was at the beginning of this month.  She was told that she no longer needed to see the specialist because she was doing good and that Dr. Richardson Landry could start filling this.  She is hoping someone will call her once it has been called in.

## 2015-03-05 ENCOUNTER — Other Ambulatory Visit: Payer: Self-pay | Admitting: *Deleted

## 2015-03-05 ENCOUNTER — Other Ambulatory Visit: Payer: Self-pay | Admitting: Oncology

## 2015-03-05 DIAGNOSIS — C50411 Malignant neoplasm of upper-outer quadrant of right female breast: Secondary | ICD-10-CM

## 2015-03-06 ENCOUNTER — Ambulatory Visit (HOSPITAL_BASED_OUTPATIENT_CLINIC_OR_DEPARTMENT_OTHER): Payer: Medicare Other | Admitting: Oncology

## 2015-03-06 ENCOUNTER — Telehealth: Payer: Self-pay | Admitting: Oncology

## 2015-03-06 ENCOUNTER — Other Ambulatory Visit (HOSPITAL_BASED_OUTPATIENT_CLINIC_OR_DEPARTMENT_OTHER): Payer: Medicare Other

## 2015-03-06 ENCOUNTER — Encounter: Payer: Self-pay | Admitting: *Deleted

## 2015-03-06 VITALS — BP 133/65 | HR 88 | Temp 98.4°F | Resp 18 | Ht 64.5 in | Wt 279.3 lb

## 2015-03-06 DIAGNOSIS — C50411 Malignant neoplasm of upper-outer quadrant of right female breast: Secondary | ICD-10-CM

## 2015-03-06 DIAGNOSIS — C773 Secondary and unspecified malignant neoplasm of axilla and upper limb lymph nodes: Secondary | ICD-10-CM

## 2015-03-06 LAB — COMPREHENSIVE METABOLIC PANEL
ALBUMIN: 4.1 g/dL (ref 3.5–5.0)
ALK PHOS: 90 U/L (ref 40–150)
ALT: 18 U/L (ref 0–55)
AST: 21 U/L (ref 5–34)
Anion Gap: 11 mEq/L (ref 3–11)
BILIRUBIN TOTAL: 0.47 mg/dL (ref 0.20–1.20)
BUN: 15.7 mg/dL (ref 7.0–26.0)
CALCIUM: 9.6 mg/dL (ref 8.4–10.4)
CO2: 21 mEq/L — ABNORMAL LOW (ref 22–29)
Chloride: 113 mEq/L — ABNORMAL HIGH (ref 98–109)
Creatinine: 1.2 mg/dL — ABNORMAL HIGH (ref 0.6–1.1)
EGFR: 60 mL/min/{1.73_m2} — ABNORMAL LOW (ref >90)
Glucose: 167 mg/dl — ABNORMAL HIGH (ref 70–140)
POTASSIUM: 4.2 meq/L (ref 3.5–5.1)
Sodium: 145 mEq/L (ref 136–145)
Total Protein: 8 g/dL (ref 6.4–8.3)

## 2015-03-06 LAB — CBC WITH DIFFERENTIAL/PLATELET
BASO%: 0.8 % (ref 0.0–2.0)
Basophils Absolute: 0 10*3/uL (ref 0.0–0.1)
EOS ABS: 0.3 10*3/uL (ref 0.0–0.5)
EOS%: 5.5 % (ref 0.0–7.0)
HCT: 38.1 % (ref 34.8–46.6)
HGB: 12.4 g/dL (ref 11.6–15.9)
LYMPH%: 27.9 % (ref 14.0–49.7)
MCH: 27.8 pg (ref 25.1–34.0)
MCHC: 32.6 g/dL (ref 31.5–36.0)
MCV: 85.1 fL (ref 79.5–101.0)
MONO#: 0.3 10*3/uL (ref 0.1–0.9)
MONO%: 6.1 % (ref 0.0–14.0)
NEUT#: 3.3 10*3/uL (ref 1.5–6.5)
NEUT%: 59.7 % (ref 38.4–76.8)
PLATELETS: 250 10*3/uL (ref 145–400)
RBC: 4.47 10*6/uL (ref 3.70–5.45)
RDW: 16.1 % — ABNORMAL HIGH (ref 11.2–14.5)
WBC: 5.6 10*3/uL (ref 3.9–10.3)
lymph#: 1.6 10*3/uL (ref 0.9–3.3)

## 2015-03-06 NOTE — Progress Notes (Signed)
03/06/15 at 2:32pm- NSABP B-51/RTOG 1304 - 18 months post randomization visit- The pt was into the cancer center today for her 18 months post randomization visit with her daughter. The pt was seen and examined today by Dr. Jana Hakim. Dr. Jana Hakim said the pt has "no evidence of disease recurrence". The pt was notified that her next on-study visit will be in 6 months around August 2017. The pt verbalized understanding. The pt said that her mammogram will be done in March 2017. The pt was thanked for her continued support of this clinical trial. Brion Aliment RN, BSN, CCRP Clinical Research Nurse 03/06/2015 2:35 PM

## 2015-03-06 NOTE — Progress Notes (Signed)
[place]  Telephone:([phone]) [phone] Fax:(336) [phone]    ID: [name] OB: 19-Jun-1955  MR#: [id number]  HER#:[id number]  PCP: Lacey Hillier, MD GYN:   SU: Dr. Erroll Jackson OTHER MD: Dr. Thea Jackson  CHIEF COMPLAINT:  Locally advanced breast cancer  TREATMENT: Anastrozole  BREAST CANCER HISTORY: From Dr [name] original intake note:  The patient herself noted a change in her right breast November 2014, but did not tell her family until after Christmas at the year. They "pretty much forced me" to have a mammogram and bilateral diagnostic mammography and right ultrasound 01/19/2013 at the breast Center showed an irregular mass associated with pleomorphic calcifications in the upper-outer quadrant of the right breast measuring 1.6 cm. There was an abnormal appearing right axillary lymph node. On physical exam, there was a movable firm palpable mass in the right breast measuring approximately 2 cm by palpation. Ultrasound showed this to be an irregularly marginated hypoechoic mass measuring 1.8 cm. In the right axilla the ultrasound showed a 1.3 cm abnormal appearing level I lymph node (loss a fatty hilum).  The 01/26/2013 the patient underwent biopsy of both the right breast mass in question and a suspicious right axillary lymph node. 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   Her subsequent history is as detailed below.  INTERVAL HISTORY: [name] returns to clinic today for follow up of her estrogen receptor positive breast cancer, accompanied by her daughter [name]. [name] continues on anastrozole, with no specific complaints regarding that  drug.  REVIEW OF SYSTEMS: [name] is getting back to her more usual activity level, including driving, cooking, and doing a little bit of housework. Walking around is difficult for her because her knees are just about gone. She is status post remote left knee replacement and she says her right knee is "bone-on-bone." She tells me her orthopedist as told her until her diabetes gets fixity cannot fix her knees. She is not exercising regularly at present. She has some low back pain and tells me there is some tissue collection on the right lower back which is bigger than the left lower back. Aside from these issues a detailed review of systems today was noncontributory  PAST MEDICAL HISTORY: Past Medical History  Diagnosis Date  . Hyperlipidemia   . Obesity   . Asthma     prn neb. and inhaler  . Osteoarthritis 2003    left knee  . Non-insulin dependent type 2 diabetes mellitus ([place])   . Graves' disease   . Ophthalmic manifestation of Graves disease   . Hypertension     on multiple meds., has been on med. > 14 yr.  . Dehydration 04/13/2013  . Breast cancer ([place]) 01/2013    right  . Wears glasses   . GERD (gastroesophageal reflux disease)   . Radiation 10/11/13-11/29/13    Right Breast  . Sleep apnea 4/16    mod    PAST SURGICAL HISTORY: Past Surgical History  Procedure Laterality Date  . Total knee arthroplasty Left 2003  . Total thyroidectomy    . Colonoscopy  2007  . Tubal ligation  1985  . Colonoscopy w/ polypectomy  2009  . Portacath placement Right 02/17/2013  Procedure: INSERTION PORT-A-CATH;  Surgeon: Lacey Faster. Cornett, MD;  Location: [facility];  Service: General;  Laterality: Right;  . Tee without cardioversion N/A 11/08/2013    Procedure: TRANSESOPHAGEAL ECHOCARDIOGRAM (TEE);  Surgeon: Lacey Perla, MD;  Location: [facility];  Service: Cardiovascular;  Laterality: N/A;  . Cardioversion N/A 11/08/2013    Procedure: CARDIOVERSION;  Surgeon: Lacey Perla, MD;  Location: [facility];  Service: Cardiovascular;  Laterality: N/A;  . Port-a-cath removal Right 05/18/2014    Procedure: REMOVAL PORT-A-CATH;  Surgeon: Lacey Luna, MD;  Location: [facility];  Service: General;  Laterality: Right;  . Excision of keloid N/A 05/18/2014    Procedure: EXCISION OF KELOID;  Surgeon: Lacey Luna, MD;  Location: [facility];  Service: General;  Laterality: N/A;    FAMILY HISTORY Family History  Problem Relation Age of Onset  . Heart disease Father   . Lung cancer Father   . Diabetes Mother   . Diabetes Sister   . Hypertension Sister   . Asthma Sister    the patient's father died from lung cancer at the age of 77. The patient's mother died from thyroid cancer at the age of 82. The patient had 2 brothers, 2 sisters. There is no other history of breast or ovarian cancer in the family to her knowledge  GYN HISTORY: Menarche age 68, first live birth age 73. The patient is GX P2. She went through the change of life approximately age 16. She did not take hormone replacement.  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 She is now disabled secondary to her knee problems. She is divorced. At home she lives with her 2 daughters, [name] who works in direct supports to mentally retarded children, and [name], who is a group Games developer for mentally retarded children. The patient has no grandchildren. She attends a [employer]  ADVANCED DIRECTIVES: Not in place; these have been discussed with the patient, most recently in the 07/28/2013 visit.  HEALTH MAINTENANCE: Social History  Substance Use Topics  . Smoking status: Former Smoker -- 0.25 packs/day for 20 years    Quit date: 02/10/1991  . Smokeless tobacco: Never Used  . Alcohol Use: No      Allergies  Allergen Reactions  . Procardia [Nifedipine] Other (See Comments)    MIGRAINES  . Aspirin      Unable to take due to blood pressure medication  . Diltiazem Itching    Current Outpatient Prescriptions  Medication Sig Dispense Refill  . ACCU-CHEK SMARTVIEW test strip USE ONE TEST STRIP TO CHECK BLOOD SUGAR 4 TIMES DAILY 100 each 5  . acetaminophen (TYLENOL) 325 MG tablet Take 2 tablets (650 mg total) by mouth every 6 (six) hours as needed for mild pain or headache.    . albuterol (PROVENTIL HFA;VENTOLIN HFA) 108 (90 BASE) MCG/ACT inhaler Inhale 2 puffs into the lungs every 4 (four) hours as needed. 18 g 2  . amLODipine (NORVASC) 10 MG tablet Take 1 tablet (10 mg total) by mouth daily. 90 tablet 1  . anastrozole (ARIMIDEX) 1 MG tablet Take 1 tablet (1 mg total) by mouth daily. 90 tablet 0  . Artificial Tear Ointment (ARTIFICIAL TEARS) ointment Place 1 drop into both eyes as needed (for grey eye disease).     . benazepril (LOTENSIN) 40 MG tablet Take 1 tablet (40 mg total) by mouth daily. 30 tablet 5  . Biotin 1000  MCG tablet Take 1,000 mcg by mouth daily.    . calcium carbonate (TUMS EX) 750 MG chewable tablet Chew 2 tablets by mouth 4 (four) times daily.    . canagliflozin (INVOKANA) 100 MG TABS tablet Take 1 tablet (100 mg total) by mouth daily before breakfast. 30 tablet 5  . carvedilol (COREG) 12.5 MG tablet TAKE (1) TABLET BY MOUTH TWICE DAILY. 180 tablet 0  . cholecalciferol (VITAMIN D) 1000 UNITS tablet Take 1,000 Units by mouth daily.    Marland Kitchen ELIQUIS 5 MG TABS tablet TAKE (1) TABLET BY MOUTH TWICE DAILY. 60 tablet 3  . gabapentin (NEURONTIN) 100 MG capsule Take 1 capsule (100 mg total) by mouth 3 (three) times daily. 90 capsule 5  . levothyroxine (SYNTHROID, LEVOTHROID) 88 MCG tablet Take 1 tablet (88 mcg total) by mouth daily. 30 tablet 5  . loratadine (CLARITIN) 10 MG tablet Take 1 tablet (10 mg total) by mouth daily. 30 tablet 5  . metFORMIN (GLUCOPHAGE) 500 MG tablet Take 2 tablets (1,000 mg total) by mouth 2 (two) times daily with a meal. 120 tablet 5  . pravastatin (PRAVACHOL)  80 MG tablet Take 40 mg by mouth daily.     Current Facility-Administered Medications  Medication Dose Route Frequency Provider Last Rate Last Dose  . methylPREDNISolone acetate (DEPO-MEDROL) injection 40 mg  40 mg Intra-articular Once Mikey Kirschner, MD        OBJECTIVE: Middle-aged [place] American woman who was unable to climb on the examination table   Filed Vitals:   03/06/15 1126  BP: 133/65  Pulse: 88  Temp: 98.4 F (36.9 C)  Resp: 18     Body mass index is 47.22 kg/(m^2).      ECOG: 2  Sclerae unicteric, EOMs intact Oropharynx clear and moist No cervical or supraclavicular adenopathy Lungs no rales or rhonchi Heart regular rate and rhythm Abd soft, obese, nontender, positive bowel sounds MSK no focal spinal tenderness and specifically no lumbosacral tenderness, no upper extremity lymphedema; the areas of "tissue" that she has some both sides are simply fat collections, the one on the right flank being a little bigger than the one on the left Neuro: nonfocal, well oriented, appropriate affect Breasts: The right breast is status post lumpectomy and radiation. There is skin thickening and darkening related to the radiation treatments but no evidence of local recurrence. The right axilla is benign. The left breast is unremarkable.     LAB RESULTS:  CMP     Component Value Date/Time   NA 144 09/04/2014 1120   NA 145* 04/26/2014 0951   NA 139 12/29/2013 1209   K 3.8 09/04/2014 1120   K 4.1 04/26/2014 0951   CL 102 04/26/2014 0951   CO2 23 09/04/2014 1120   CO2 25 04/26/2014 0951   GLUCOSE 254* 09/04/2014 1120   GLUCOSE 136* 04/26/2014 0951   GLUCOSE 144* 12/29/2013 1209   BUN 16.4 09/04/2014 1120   BUN 17 04/26/2014 0951   BUN 19 12/29/2013 1209   CREATININE 1.2* 09/04/2014 1120   CREATININE 1.08* 04/26/2014 0951   CREATININE 1.28* 05/06/2013 1149   CALCIUM 9.4 09/04/2014 1120   CALCIUM 9.0 04/26/2014 0951   CALCIUM 6.4* 05/08/2013 1215   PROT 7.8  10/25/2014 1111   PROT 6.9 09/04/2014 1120   PROT 6.7 11/04/2013 0513   ALBUMIN 4.8 10/25/2014 1111   ALBUMIN 3.8 09/04/2014 1120   ALBUMIN 3.4* 11/04/2013 0513   AST 20 10/25/2014 1111   AST 17 09/04/2014 1120  ALT 14 10/25/2014 1111   ALT 18 09/04/2014 1120   ALKPHOS 93 10/25/2014 1111   ALKPHOS 88 09/04/2014 1120   BILITOT 0.4 10/25/2014 1111   BILITOT 0.39 09/04/2014 1120   BILITOT 0.3 11/04/2013 0513   GFRNONAA 56* 04/26/2014 0951   GFRAA 65 04/26/2014 0951    I No results found for: SPEP  Lab Results  Component Value Date   WBC 5.6 03/06/2015   NEUTROABS 3.3 03/06/2015   HGB 12.4 03/06/2015   HCT 38.1 03/06/2015   MCV 85.1 03/06/2015   PLT 250 03/06/2015      Chemistry      Component Value Date/Time   NA 144 09/04/2014 1120   NA 145* 04/26/2014 0951   NA 139 12/29/2013 1209   K 3.8 09/04/2014 1120   K 4.1 04/26/2014 0951   CL 102 04/26/2014 0951   CO2 23 09/04/2014 1120   CO2 25 04/26/2014 0951   BUN 16.4 09/04/2014 1120   BUN 17 04/26/2014 0951   BUN 19 12/29/2013 1209   CREATININE 1.2* 09/04/2014 1120   CREATININE 1.08* 04/26/2014 0951   CREATININE 1.28* 05/06/2013 1149      Component Value Date/Time   CALCIUM 9.4 09/04/2014 1120   CALCIUM 9.0 04/26/2014 0951   CALCIUM 6.4* 05/08/2013 1215   ALKPHOS 93 10/25/2014 1111   ALKPHOS 88 09/04/2014 1120   AST 20 10/25/2014 1111   AST 17 09/04/2014 1120   ALT 14 10/25/2014 1111   ALT 18 09/04/2014 1120   BILITOT 0.4 10/25/2014 1111   BILITOT 0.39 09/04/2014 1120   BILITOT 0.3 11/04/2013 0513       No results found for: LABCA2  No components found for: [id number]  No results for input(s): INR in the last 168 hours.  Urinalysis    Component Value Date/Time   COLORURINE YELLOW 09/06/2013 1040   APPEARANCEUR CLEAR 09/06/2013 1040   LABSPEC 1.015 09/06/2013 1040   PHURINE 5.0 09/06/2013 1040   GLUCOSEU NEGATIVE 09/06/2013 1040   HGBUR NEGATIVE 09/06/2013 1040   BILIRUBINUR NEGATIVE  09/06/2013 1040   KETONESUR NEGATIVE 09/06/2013 1040   PROTEINUR NEGATIVE 09/06/2013 1040   UROBILINOGEN 0.2 09/06/2013 1040   NITRITE NEGATIVE 09/06/2013 1040   LEUKOCYTESUR NEGATIVE 09/06/2013 1040    STUDIES:  CLINICAL DATA: History of malignant lumpectomy of the right breast in July 2015. Patient underwent right breast radiation therapy which was completed approximately 1 month ago. Follow-up evaluation.  EXAM: DIGITAL DIAGNOSTIC bilateral MAMMOGRAM WITH CAD  COMPARISON: 07/14/2013, 01/19/2013.  ACR Breast Density Category c: The breast tissue is heterogeneously dense, which may obscure small masses.  FINDINGS: There are post lumpectomy scarring changes present within the upper outer quadrant of the right breast with diffuse skin thickening and increased trabeculation within the right breast related to the patient's recent radiation therapy. The left breast parenchymal pattern is stable. There is no specific evidence for recurrent tumor or developing malignancy.  Mammographic images were processed with CAD.  IMPRESSION: Postlumpectomy changes and radiation therapy changes within the right breast. No findings worrisome for recurrent tumor or developing malignancy.  RECOMMENDATION: Bilateral diagnostic mammography in 1 year.  I have discussed the findings and recommendations with the patient. Results were also provided in writing at the conclusion of the visit. If applicable, a reminder letter will be sent to the patient regarding the next appointment.  BI-RADS CATEGORY 2: Benign.   Electronically Signed  By: Altamese Cabal M.D.  On: 03/14/2014 13:14                *  [facility]*            [address] [place], [place] [address]               [phone]  ------------------------------------------------------------------- Transthoracic Echocardiography  Patient:  Mara, Favero MR #:    [id number] Study Date: 02/15/2015 Gender:   F Age:    59 Height:   162.6 cm Weight:   123.4 kg BSA:    2.43 m^2 Pt. Status: Room:  ATTENDING  Theda Belfast   Bensimhon, Daniel REFERRING  Bensimhon, Sterling Big, Outpatient SONOGRAPHER Darlina Sicilian, RDCS  cc:  ------------------------------------------------------------------- LV EF: 60% -  65%   ASSESSMENT: 60 y.o. [place] woman   (1) status post right breast and right axillary lymph node biopsy 01/26/2013,  for a clinical T1c N1, stage IIA invasive ductal carcinoma, grade 3, estrogen and progesterone receptor positive, HER-2 amplified by immunohistochemistry (3+), with an MIB-1 of 70%.  (2) treated neo-adjuvantly with 4 of 6 planned cycles of docetaxel, carboplatin, trastuzumab,and pertuzumab, last dose 04/27/2013   (a) final 2 cycles of chemotherapy omitted in the face of multiple renal and electrolyte abnormalities    (b) pertuzumab and trastuzumab continued until 04/27/2013  (c) trastuzumab alone continued to complete one year of anti-HER-2 treatment, last dose 04/12/2014  (d) echocardiogram 03/31/14 showing an ejection fraction in the 60-65% range  (3) status post right lumpectomy and sentinel lymph node sampling 07/19/2013 showing a complete pathologic response (ypT0 ypN0)  (4) adjuvant radiation to the breast completed on 11/29/13  (a) participating in RTOG 1304, randomized to no regional nodal XRT  (5) began anastrozole daily on 12/22/13  (a) DEXA scan 01/17/2014 showed a T score of -0.3 (normal).   PLAN: Salome is now approximately a year and a half out from her definitive surgery for breast cancer, with no evidence of disease recurrence. This is very favorable.  She continues on anastrozole with good  tolerance. The plan is to continue for a total of 5 years.  She has a significant obesity problem which is causing her diabetes, and also keeping her from being able to exercise. It is running her knees. I suggested she consider bariatric surgery. She will be meeting with Dr. Brantley Stage sometime in April or May and they can discuss it further at that visit.  Otherwise she'll will return to see [place] again in 6 months. We will do physical exam and lab work only before that visit. She does have mammogram pending in March and I have gone ahead and reentered that order for her.     Chauncey Cruel, MD

## 2015-03-06 NOTE — Telephone Encounter (Signed)
appt made and avs printed °

## 2015-03-07 ENCOUNTER — Other Ambulatory Visit: Payer: Self-pay | Admitting: Oncology

## 2015-03-07 DIAGNOSIS — C50411 Malignant neoplasm of upper-outer quadrant of right female breast: Secondary | ICD-10-CM

## 2015-03-07 DIAGNOSIS — Z9889 Other specified postprocedural states: Secondary | ICD-10-CM

## 2015-03-20 ENCOUNTER — Encounter (HOSPITAL_COMMUNITY): Payer: Medicare Other

## 2015-03-30 ENCOUNTER — Encounter: Payer: Self-pay | Admitting: Family Medicine

## 2015-03-30 ENCOUNTER — Ambulatory Visit (INDEPENDENT_AMBULATORY_CARE_PROVIDER_SITE_OTHER): Payer: Medicare Other | Admitting: Family Medicine

## 2015-03-30 VITALS — BP 132/74 | Temp 99.1°F | Ht 64.5 in | Wt 278.0 lb

## 2015-03-30 DIAGNOSIS — N3000 Acute cystitis without hematuria: Secondary | ICD-10-CM

## 2015-03-30 DIAGNOSIS — R300 Dysuria: Secondary | ICD-10-CM

## 2015-03-30 LAB — POCT URINALYSIS DIPSTICK
Ketones, UA: POSITIVE
PH UA: 5
Spec Grav, UA: <=1.005

## 2015-03-30 MED ORDER — CIPROFLOXACIN HCL 500 MG PO TABS: 500.0000 mg | ORAL_TABLET | Freq: Two times a day (BID) | ORAL | Status: DC

## 2015-03-30 NOTE — Progress Notes (Signed)
   Subjective:    Patient ID: [name], female    DOB: 1955/03/30, 60 y.o.   MRN: [id number]  Urinary Tract Infection  This is a new problem. Episode onset: 2 weeks ago. Associated symptoms include urgency. Associated symptoms comments: Low back pain. Treatments tried: water, diet cranberry juice.   Results for orders placed or performed in visit on 03/30/15  POCT urinalysis dipstick  Result Value Ref Range   Color, UA Yellow    Clarity, UA Cloudy    Glucose, UA 500+    Bilirubin, UA     Ketones, UA Positive    Spec Grav, UA <=1.005    Blood, UA Small    pH, UA 5.0    Protein, UA     Urobilinogen, UA     Nitrite, UA     Leukocytes, UA Trace (A) Negative   No fever or chills or vomiting  Right flank pain. Last few days. Positive dysuria positive increase frequency, no obvious fever chills Big water drinker normally  Review of Systems  Genitourinary: Positive for urgency.   no vomiting no diarrhea alert active vitals stable hydration good. Lungs clear. Heart regular in rhythm plus minus right CVA tenderness     Objective:   Physical Exam See above       Assessment & Plan:  Impression UTI question right kidney component plan Cipro twice a day 7 days. Symptom care discussed warning signs discussed

## 2015-04-02 ENCOUNTER — Other Ambulatory Visit: Payer: Self-pay | Admitting: *Deleted

## 2015-04-02 ENCOUNTER — Telehealth: Payer: Self-pay | Admitting: Family Medicine

## 2015-04-02 MED ORDER — NITROFURANTOIN MONOHYD MACRO 100 MG PO CAPS: 100.0000 mg | ORAL_CAPSULE | Freq: Two times a day (BID) | ORAL | Status: DC

## 2015-04-02 NOTE — Telephone Encounter (Signed)
macrobid 100 bid seven d

## 2015-04-02 NOTE — Telephone Encounter (Signed)
Discussed with pt. Med sent to pharm.  

## 2015-04-02 NOTE — Telephone Encounter (Signed)
Pt called stating that she was seen Friday and was given an antibiotic. Pt states that she started the antibiotic Friday night. Pt states that when woke up sat morning she had a slight headache. Pt then took her morning dose of the antibiotic. Pt stated that as the day went on the headache got worse and felt like someone was pulling her hair out. Pt took her evening dose and went to bed. Pt did not take any yesterday and states that she had no headache yesterday. Please advise.

## 2015-04-02 NOTE — Telephone Encounter (Signed)
Patient was placed on Cipro 03/30/15

## 2015-04-03 ENCOUNTER — Telehealth: Payer: Self-pay | Admitting: Family Medicine

## 2015-04-03 ENCOUNTER — Other Ambulatory Visit: Payer: Self-pay | Admitting: *Deleted

## 2015-04-03 ENCOUNTER — Encounter (HOSPITAL_COMMUNITY): Payer: Medicare Other

## 2015-04-03 DIAGNOSIS — E119 Type 2 diabetes mellitus without complications: Secondary | ICD-10-CM | POA: Diagnosis not present

## 2015-04-03 MED ORDER — ALBUTEROL SULFATE (2.5 MG/3ML) 0.083% IN NEBU: 2.5000 mg | INHALATION_SOLUTION | Freq: Four times a day (QID) | RESPIRATORY_TRACT | Status: DC | PRN

## 2015-04-03 NOTE — Telephone Encounter (Signed)
Pt needs refill on her albuterol for her neb machine  Can we send this to [facility] please

## 2015-04-03 NOTE — Telephone Encounter (Signed)
Med sent to pharm. Pt notified.  

## 2015-04-07 ENCOUNTER — Emergency Department (HOSPITAL_COMMUNITY)
Admission: EM | Admit: 2015-04-07 | Discharge: 2015-04-08 | Disposition: A | Discharge status: Home / Self Care | Payer: Medicare Other | Attending: Emergency Medicine | Admitting: Emergency Medicine

## 2015-04-07 ENCOUNTER — Encounter (HOSPITAL_COMMUNITY): Payer: Self-pay | Admitting: Emergency Medicine

## 2015-04-07 DIAGNOSIS — M1712 Unilateral primary osteoarthritis, left knee: Secondary | ICD-10-CM | POA: Diagnosis not present

## 2015-04-07 DIAGNOSIS — Z853 Personal history of malignant neoplasm of breast: Secondary | ICD-10-CM | POA: Insufficient documentation

## 2015-04-07 DIAGNOSIS — Z973 Presence of spectacles and contact lenses: Secondary | ICD-10-CM | POA: Insufficient documentation

## 2015-04-07 DIAGNOSIS — Z8669 Personal history of other diseases of the nervous system and sense organs: Secondary | ICD-10-CM | POA: Insufficient documentation

## 2015-04-07 DIAGNOSIS — J45901 Unspecified asthma with (acute) exacerbation: Secondary | ICD-10-CM | POA: Insufficient documentation

## 2015-04-07 DIAGNOSIS — R509 Fever, unspecified: Secondary | ICD-10-CM | POA: Insufficient documentation

## 2015-04-07 DIAGNOSIS — E669 Obesity, unspecified: Secondary | ICD-10-CM | POA: Insufficient documentation

## 2015-04-07 DIAGNOSIS — R05 Cough: Secondary | ICD-10-CM | POA: Diagnosis not present

## 2015-04-07 DIAGNOSIS — Z792 Long term (current) use of antibiotics: Secondary | ICD-10-CM | POA: Diagnosis not present

## 2015-04-07 DIAGNOSIS — K219 Gastro-esophageal reflux disease without esophagitis: Secondary | ICD-10-CM | POA: Insufficient documentation

## 2015-04-07 DIAGNOSIS — E119 Type 2 diabetes mellitus without complications: Secondary | ICD-10-CM | POA: Diagnosis not present

## 2015-04-07 DIAGNOSIS — M549 Dorsalgia, unspecified: Secondary | ICD-10-CM | POA: Insufficient documentation

## 2015-04-07 DIAGNOSIS — R059 Cough, unspecified: Secondary | ICD-10-CM

## 2015-04-07 DIAGNOSIS — Z79899 Other long term (current) drug therapy: Secondary | ICD-10-CM | POA: Insufficient documentation

## 2015-04-07 DIAGNOSIS — Z87891 Personal history of nicotine dependence: Secondary | ICD-10-CM | POA: Diagnosis not present

## 2015-04-07 DIAGNOSIS — Z7984 Long term (current) use of oral hypoglycemic drugs: Secondary | ICD-10-CM | POA: Diagnosis not present

## 2015-04-07 DIAGNOSIS — E0500 Thyrotoxicosis with diffuse goiter without thyrotoxic crisis or storm: Secondary | ICD-10-CM | POA: Diagnosis not present

## 2015-04-07 DIAGNOSIS — I10 Essential (primary) hypertension: Secondary | ICD-10-CM | POA: Insufficient documentation

## 2015-04-07 DIAGNOSIS — R350 Frequency of micturition: Secondary | ICD-10-CM | POA: Insufficient documentation

## 2015-04-07 LAB — CBC
HEMATOCRIT: 35.3 % — AB (ref 36.0–46.0)
Hemoglobin: 11.6 g/dL — ABNORMAL LOW (ref 12.0–15.0)
MCH: 27.4 pg (ref 26.0–34.0)
MCHC: 32.9 g/dL (ref 30.0–36.0)
MCV: 83.3 fL (ref 78.0–100.0)
PLATELETS: 189 10*3/uL (ref 150–400)
RBC: 4.24 MIL/uL (ref 3.87–5.11)
RDW: 15.8 % — ABNORMAL HIGH (ref 11.5–15.5)
WBC: 4.5 10*3/uL (ref 4.0–10.5)

## 2015-04-07 NOTE — ED Notes (Signed)
Pt from home with c/o cough, back pain, and urinary frequency. This began last Friday when pt was diagnosed with a UTI at her PCP. The pt states she has had fever/ chills for about a week. Pt states that on Tuesday, she developed a cough that began as productive, but is now dry. Pt has expiratory wheezes, but maintains her oxygen saturation wnl

## 2015-04-08 ENCOUNTER — Emergency Department (HOSPITAL_COMMUNITY): Payer: Medicare Other

## 2015-04-08 DIAGNOSIS — R05 Cough: Secondary | ICD-10-CM | POA: Diagnosis not present

## 2015-04-08 LAB — URINALYSIS, ROUTINE W REFLEX MICROSCOPIC
Bilirubin Urine: NEGATIVE
Hgb urine dipstick: NEGATIVE
Ketones, ur: NEGATIVE mg/dL
LEUKOCYTES UA: NEGATIVE
NITRITE: NEGATIVE
PH: 5 (ref 5.0–8.0)
PROTEIN: 30 mg/dL — AB
Specific Gravity, Urine: 1.02 (ref 1.005–1.030)

## 2015-04-08 LAB — URINE MICROSCOPIC-ADD ON: RBC / HPF: NONE SEEN RBC/hpf (ref 0–5)

## 2015-04-08 LAB — RAPID STREP SCREEN (MED CTR MEBANE ONLY): Streptococcus, Group A Screen (Direct): NEGATIVE

## 2015-04-08 MED ORDER — IPRATROPIUM BROMIDE 0.02 % IN SOLN
0.5000 mg | Freq: Once | RESPIRATORY_TRACT | Status: AC
  Administered 2015-04-08: 0.5 mg via RESPIRATORY_TRACT
  Filled 2015-04-08: qty 2.5

## 2015-04-08 MED ORDER — ALBUTEROL SULFATE (2.5 MG/3ML) 0.083% IN NEBU
5.0000 mg | INHALATION_SOLUTION | Freq: Once | RESPIRATORY_TRACT | Status: AC
  Administered 2015-04-08: 5 mg via RESPIRATORY_TRACT
  Filled 2015-04-08: qty 6

## 2015-04-08 MED ORDER — BENZONATATE 100 MG PO CAPS
200.0000 mg | ORAL_CAPSULE | Freq: Once | ORAL | Status: AC
  Administered 2015-04-08: 200 mg via ORAL
  Filled 2015-04-08: qty 2

## 2015-04-08 MED ORDER — BENZONATATE 100 MG PO CAPS: 100.0000 mg | ORAL_CAPSULE | Freq: Three times a day (TID) | ORAL | Status: DC

## 2015-04-08 NOTE — Discharge Instructions (Signed)
Cough, Adult A cough helps to clear your throat and lungs. A cough may last only 2-3 weeks (acute), or it may last longer than 8 weeks (chronic). Many different things can cause a cough. A cough may be a sign of an illness or another medical condition. HOME CARE  Pay attention to any changes in your cough.  Take medicines only as told by your doctor.  If you were prescribed an antibiotic medicine, take it as told by your doctor. Do not stop taking it even if you start to feel better.  Talk with your doctor before you try using a cough medicine.  Drink enough fluid to keep your pee (urine) clear or pale yellow.  If the air is dry, use a cold steam vaporizer or humidifier in your home.  Stay away from things that make you cough at work or at home.  If your cough is worse at night, try using extra pillows to raise your head up higher while you sleep.  Do not smoke, and try not to be around smoke. If you need help quitting, ask your doctor.  Do not have caffeine.  Do not drink alcohol.  Rest as needed. GET HELP IF:  You have new problems (symptoms).  You cough up yellow fluid (pus).  Your cough does not get better after 2-3 weeks, or your cough gets worse.  Medicine does not help your cough and you are not sleeping well.  You have pain that gets worse or pain that is not helped with medicine.  You have a fever.  You are losing weight and you do not know why.  You have night sweats. GET HELP RIGHT AWAY IF:  You cough up blood.  You have trouble breathing.  Your heartbeat is very fast.   This information is not intended to replace advice given to you by your health care provider. Make sure you discuss any questions you have with your health care provider.   Document Released: 09/12/2010 Document Revised: 09/20/2014 Document Reviewed: 03/08/2014 Elsevier Interactive Patient Education 2016 [place] may help relieve the symptoms  of a cough and cold. They add moisture to the air, which helps mucus to become thinner and less sticky. This makes it easier to breathe and cough up secretions. Cool mist vaporizers do not cause serious burns like hot mist vaporizers, which may also be called steamers or humidifiers. Vaporizers have not been proven to help with colds. You should not use a vaporizer if you are allergic to mold. HOME CARE INSTRUCTIONS  Follow the package instructions for the vaporizer.  Do not use anything other than distilled water in the vaporizer.  Do not run the vaporizer all of the time. This can cause mold or bacteria to grow in the vaporizer.  Clean the vaporizer after each time it is used.  Clean and dry the vaporizer well before storing it.  Stop using the vaporizer if worsening respiratory symptoms develop.   This information is not intended to replace advice given to you by your health care provider. Make sure you discuss any questions you have with your health care provider.   Document Released: 09/27/2003 Document Revised: 01/04/2013 Document Reviewed: 05/19/2012 Elsevier Interactive Patient Education [employer].

## 2015-04-08 NOTE — ED Provider Notes (Addendum)
CSN: [id number]     Arrival date & time 04/07/15  2108 History   First MD Initiated Contact with Patient 04/07/15 2337     Chief Complaint  Patient presents with  . Shortness of Breath  . Cough  . Back Pain     (Consider location/radiation/quality/duration/timing/severity/associated sxs/prior Treatment) Patient is a 60 y.o. female presenting with shortness of breath, cough, and back pain. The history is provided by the patient and a relative. No language interpreter was used.  Shortness of Breath Severity:  Mild Onset quality:  Gradual Duration:  4 days Associated symptoms: cough and fever   Associated symptoms: no rash, no sore throat and no vomiting   Associated symptoms comment:  Patient here with cough, chills and reported fever for 4 days. No vomiting. She had symptoms of urinary frequency as well and is currently on Macrobid by her PCP x 3 days with improvement. Cough is now a dry cough where before it was productive. She has been using her nebulizer without significant relief. No nasal congestion.  Cough Associated symptoms: chills, fever and shortness of breath   Associated symptoms: no myalgias, no rash and no sore throat   Back Pain Associated symptoms: fever   Associated symptoms: no dysuria     Past Medical History  Diagnosis Date  . Hyperlipidemia   . Obesity   . Asthma     prn neb. and inhaler  . Osteoarthritis 2003    left knee  . Non-insulin dependent type 2 diabetes mellitus ([place])   . Graves' disease   . Ophthalmic manifestation of Graves disease   . Hypertension     on multiple meds., has been on med. > 14 yr.  . Dehydration 04/13/2013  . Breast cancer ([place]) 01/2013    right  . Wears glasses   . GERD (gastroesophageal reflux disease)   . Radiation 10/11/13-11/29/13    Right Breast  . Sleep apnea 4/16    mod   Past Surgical History  Procedure Laterality Date  . Total knee arthroplasty Left 2003  . Total thyroidectomy    . Colonoscopy  2007  . Tubal  ligation  1985  . Colonoscopy w/ polypectomy  2009  . Portacath placement Right 02/17/2013    Procedure: INSERTION PORT-A-CATH;  Surgeon: Joyice Faster. Cornett, MD;  Location: [facility];  Service: General;  Laterality: Right;  . Tee without cardioversion N/A 11/08/2013    Procedure: TRANSESOPHAGEAL ECHOCARDIOGRAM (TEE);  Surgeon: Lelon Perla, MD;  Location: [place];  Service: Cardiovascular;  Laterality: N/A;  . Cardioversion N/A 11/08/2013    Procedure: CARDIOVERSION;  Surgeon: Lelon Perla, MD;  Location: [facility];  Service: Cardiovascular;  Laterality: N/A;  . Port-a-cath removal Right 05/18/2014    Procedure: REMOVAL PORT-A-CATH;  Surgeon: Erroll Luna, MD;  Location: [facility];  Service: General;  Laterality: Right;  . Excision of keloid N/A 05/18/2014    Procedure: EXCISION OF KELOID;  Surgeon: Erroll Luna, MD;  Location: [place] [place];  Service: General;  Laterality: N/A;   Family History  Problem Relation Age of Onset  . Heart disease Father   . Lung cancer Father   . Diabetes Mother   . Diabetes Sister   . Hypertension Sister   . Asthma Sister    Social History  Substance Use Topics  . Smoking status: Former Smoker -- 0.25 packs/day for 20 years    Quit date: 02/10/1991  . Smokeless tobacco: Never Used  . Alcohol  Use: No   OB History    No data available     Review of Systems  Constitutional: Positive for fever and chills.  HENT: Negative for congestion and sore throat.   Respiratory: Positive for cough and shortness of breath.   Gastrointestinal: Negative for nausea and vomiting.  Genitourinary: Negative.  Negative for dysuria.  Musculoskeletal: Positive for back pain. Negative for myalgias.  Skin: Negative for rash.      Allergies  Procardia; Aspirin; and Diltiazem  Home Medications   Prior to Admission medications   Medication Sig Start Date End Date Taking? Authorizing Provider  acetaminophen  (TYLENOL) 325 MG tablet Take 2 tablets (650 mg total) by mouth every 6 (six) hours as needed for mild pain or headache. 11/09/13  Yes Hosie Poisson, MD  albuterol (PROVENTIL HFA;VENTOLIN HFA) 108 (90 BASE) MCG/ACT inhaler Inhale 2 puffs into the lungs every 4 (four) hours as needed. 11/03/14  Yes Mikey Kirschner, MD  albuterol (PROVENTIL) (2.5 MG/3ML) 0.083% nebulizer solution Take 3 mLs (2.5 mg total) by nebulization every 6 (six) hours as needed for wheezing or shortness of breath. 04/03/15 06/24/15 Yes Mikey Kirschner, MD  amLODipine (NORVASC) 10 MG tablet Take 1 tablet (10 mg total) by mouth daily. 02/22/15  Yes Mikey Kirschner, MD  anastrozole (ARIMIDEX) 1 MG tablet Take 1 tablet (1 mg total) by mouth daily. 12/20/14  Yes Chauncey Cruel, MD  Artificial Tear Ointment (ARTIFICIAL TEARS) ointment Place 1 drop into both eyes as needed (for grey eye disease).    Yes Historical Provider, MD  benazepril (LOTENSIN) 40 MG tablet Take 1 tablet (40 mg total) by mouth daily. 02/22/15  Yes Mikey Kirschner, MD  Biotin 1000 MCG tablet Take 1,000 mcg by mouth daily.   Yes Historical Provider, MD  calcium carbonate (TUMS EX) 750 MG chewable tablet Chew 2 tablets by mouth 4 (four) times daily.   Yes Historical Provider, MD  canagliflozin (INVOKANA) 100 MG TABS tablet Take 1 tablet (100 mg total) by mouth daily before breakfast. 02/22/15  Yes Mikey Kirschner, MD  carvedilol (COREG) 12.5 MG tablet TAKE (1) TABLET BY MOUTH TWICE DAILY. 03/02/15  Yes Mikey Kirschner, MD  cholecalciferol (VITAMIN D) 1000 UNITS tablet Take 1,000 Units by mouth daily.   Yes Historical Provider, MD  ELIQUIS 5 MG TABS tablet TAKE (1) TABLET BY MOUTH TWICE DAILY. 02/02/15  Yes Larey Dresser, MD  gabapentin (NEURONTIN) 100 MG capsule Take 1 capsule (100 mg total) by mouth 3 (three) times daily. 11/01/14  Yes Laurie Panda, NP  levothyroxine (SYNTHROID, LEVOTHROID) 88 MCG tablet Take 1 tablet (88 mcg total) by mouth daily. 02/22/15  Yes  Mikey Kirschner, MD  loratadine (CLARITIN) 10 MG tablet Take 1 tablet (10 mg total) by mouth daily. 04/27/14  Yes Mikey Kirschner, MD  metFORMIN (GLUCOPHAGE) 500 MG tablet Take 2 tablets (1,000 mg total) by mouth 2 (two) times daily with a meal. 02/22/15  Yes Mikey Kirschner, MD  nitrofurantoin, macrocrystal-monohydrate, (MACROBID) 100 MG capsule Take 1 capsule (100 mg total) by mouth 2 (two) times daily. 04/02/15  Yes Mikey Kirschner, MD  pravastatin (PRAVACHOL) 80 MG tablet Take 40 mg by mouth daily.   Yes Historical Provider, MD  ACCU-CHEK SMARTVIEW test strip USE ONE TEST STRIP TO CHECK BLOOD SUGAR 4 TIMES DAILY 01/17/15   Mikey Kirschner, MD   BP 103/72 mmHg  Pulse 89  Temp(Src) 99.6 F (37.6 C) (Oral)  Resp  16  SpO2 98% Physical Exam  Constitutional: She is oriented to person, place, and time. She appears well-developed and well-nourished.  HENT:  Head: Normocephalic.  Mouth/Throat: Oropharynx is clear and moist.  Eyes: Conjunctivae are normal.  Neck: Normal range of motion. Neck supple.  Cardiovascular: Normal rate and regular rhythm.   No murmur heard. Pulmonary/Chest: Effort normal. No respiratory distress. She has wheezes. She exhibits no tenderness.  Minimal expiratory wheezing, resolved with nebulizer x 1.  Abdominal: Soft. Bowel sounds are normal. There is no tenderness. There is no rebound and no guarding.  Musculoskeletal: Normal range of motion. She exhibits no edema.  Neurological: She is alert and oriented to person, place, and time.  Skin: Skin is warm and dry. No rash noted.  Psychiatric: She has a normal mood and affect.    ED Course  Procedures (including critical care time) Labs Review Labs Reviewed  URINALYSIS, ROUTINE W REFLEX MICROSCOPIC (NOT AT [facility]) - Abnormal; Notable for the following:    APPearance CLOUDY (*)    Glucose, UA >1000 (*)    Protein, ur 30 (*)    All other components within normal limits  CBC - Abnormal; Notable for the following:     Hemoglobin 11.6 (*)    HCT 35.3 (*)    RDW 15.8 (*)    All other components within normal limits  URINE MICROSCOPIC-ADD ON - Abnormal; Notable for the following:    Squamous Epithelial / LPF 0-5 (*)    Bacteria, UA FEW (*)    All other components within normal limits  RAPID STREP SCREEN (NOT AT [facility])  URINE CULTURE  CULTURE, GROUP A STREP [facility])   Results for orders placed or performed during the hospital encounter of 04/07/15  Rapid strep screen (not at [facility])  Result Value Ref Range   Streptococcus, Group A Screen (Direct) NEGATIVE NEGATIVE  Urinalysis, Routine w reflex microscopic-may I&O cath if menses (not at [facility])  Result Value Ref Range   Color, Urine YELLOW YELLOW   APPearance CLOUDY (A) CLEAR   Specific Gravity, Urine 1.020 1.005 - 1.030   pH 5.0 5.0 - 8.0   Glucose, UA >1000 (A) NEGATIVE mg/dL   Hgb urine dipstick NEGATIVE NEGATIVE   Bilirubin Urine NEGATIVE NEGATIVE   Ketones, ur NEGATIVE NEGATIVE mg/dL   Protein, ur 30 (A) NEGATIVE mg/dL   Nitrite NEGATIVE NEGATIVE   Leukocytes, UA NEGATIVE NEGATIVE  CBC  Result Value Ref Range   WBC 4.5 4.0 - 10.5 K/uL   RBC 4.24 3.87 - 5.11 MIL/uL   Hemoglobin 11.6 (L) 12.0 - 15.0 g/dL   HCT 35.3 (L) 36.0 - 46.0 %   MCV 83.3 78.0 - 100.0 fL   MCH 27.4 26.0 - 34.0 pg   MCHC 32.9 30.0 - 36.0 g/dL   RDW 15.8 (H) 11.5 - 15.5 %   Platelets 189 150 - 400 K/uL  Urine microscopic-add on  Result Value Ref Range   Squamous Epithelial / LPF 0-5 (A) NONE SEEN   WBC, UA 0-5 0 - 5 WBC/hpf   RBC / HPF NONE SEEN 0 - 5 RBC/hpf   Bacteria, UA FEW (A) NONE SEEN   Urine-Other AMORPHOUS URATES/PHOSPHATES     Imaging Review Dg Chest 2 View  04/08/2015  CLINICAL DATA:  60 year old female with cough EXAM: CHEST  2 VIEW COMPARISON:  Chest radiograph dated 11/03/2013 and CT dated 11/03/2013 FINDINGS: Two views of the chest do not demonstrate a focal consolidation. There is no pleural effusion or  pneumothorax. The cardiac silhouette is within  normal limits. Surgical clips noted in the region of the thyroid gland at the base of the neck. No acute osseous pathology. IMPRESSION: No active cardiopulmonary disease. Electronically Signed   By: Anner Crete M.D.   On: 04/08/2015 01:24   I have personally reviewed and evaluated these images and lab results as part of my medical decision-making. <ECG> #1 EKG: normal sinus rhythm, nonspecific ST and T waves changes. #2 EKG: normal sinus rhythm.    EKG Interpretation None      MDM   Final diagnoses:  Cough    Patient presents for cough for several days associated with SOB. History asthma using nebulizers at home. CXR negative. No leukocytosis. Cough is improving. On Macrobid for UTI - UA negative for infection. Discussed with Dr. Tomi Bamberger. Likely viral. Will provide supportive management encourage PCP follow up.    Charlann Lange, PA-C 04/08/15 [id number]  Rolland Porter, MD 04/08/15 [place], PA-C 04/18/15 [id number]  Rolland Porter, MD 04/18/15 2256

## 2015-04-08 NOTE — ED Notes (Signed)
Patient transported to X-ray 

## 2015-04-08 NOTE — ED Notes (Signed)
Pt tolerated well and maintained SPO2 >95% while ambulating around RN stations. Pt denies SOB. Pt reports her back pain resolution while ambulating.

## 2015-04-08 NOTE — ED Notes (Signed)
Pt reports acute onset of centralized 8/10 chest pain w/ no radiation. EKG WNL. PA notified.

## 2015-04-09 ENCOUNTER — Telehealth: Payer: Self-pay | Admitting: Family Medicine

## 2015-04-09 LAB — URINE CULTURE

## 2015-04-09 NOTE — Telephone Encounter (Signed)
ok 

## 2015-04-09 NOTE — Telephone Encounter (Signed)
Pt is wanting to know if she can get a script for a lift chair sent to [facility]

## 2015-04-10 LAB — CULTURE, GROUP A STREP (THRC)

## 2015-04-10 NOTE — Telephone Encounter (Signed)
Patient notified. Script faxed.

## 2015-05-01 ENCOUNTER — Ambulatory Visit (HOSPITAL_COMMUNITY)
Admission: RE | Admit: 2015-05-01 | Discharge: 2015-05-01 | Disposition: A | Discharge status: Home / Self Care | Payer: Medicare Other | Source: Ambulatory Visit | Attending: Oncology | Admitting: Oncology

## 2015-05-01 DIAGNOSIS — C50411 Malignant neoplasm of upper-outer quadrant of right female breast: Secondary | ICD-10-CM | POA: Diagnosis not present

## 2015-05-01 DIAGNOSIS — R928 Other abnormal and inconclusive findings on diagnostic imaging of breast: Secondary | ICD-10-CM | POA: Diagnosis not present

## 2015-05-03 ENCOUNTER — Other Ambulatory Visit: Payer: Self-pay

## 2015-05-03 ENCOUNTER — Telehealth: Payer: Self-pay

## 2015-05-03 DIAGNOSIS — C50411 Malignant neoplasm of upper-outer quadrant of right female breast: Secondary | ICD-10-CM

## 2015-05-03 MED ORDER — GABAPENTIN 100 MG PO CAPS: 100.0000 mg | ORAL_CAPSULE | Freq: Three times a day (TID) | ORAL | Status: DC

## 2015-05-03 NOTE — Telephone Encounter (Signed)
Pt requesting refill of gabapentin. This RN unsure if Dr Jana Hakim will fill or if Dr Wolfgang [facility] to fill.

## 2015-05-04 ENCOUNTER — Telehealth: Payer: Self-pay

## 2015-05-04 DIAGNOSIS — C50411 Malignant neoplasm of upper-outer quadrant of right female breast: Secondary | ICD-10-CM

## 2015-05-04 MED ORDER — GABAPENTIN 100 MG PO CAPS: 100.0000 mg | ORAL_CAPSULE | Freq: Three times a day (TID) | ORAL | Status: DC

## 2015-05-04 NOTE — Telephone Encounter (Signed)
Pt calling about her gabapentin refill. Told her it was sent to [facility] in [place]. Pt asked it be sent to [facility]. Rx sent to [facility] called to cancel their rx.

## 2015-05-24 ENCOUNTER — Ambulatory Visit (INDEPENDENT_AMBULATORY_CARE_PROVIDER_SITE_OTHER): Payer: Medicare Other | Admitting: Family Medicine

## 2015-05-24 ENCOUNTER — Encounter: Payer: Self-pay | Admitting: Family Medicine

## 2015-05-24 VITALS — BP 118/72 | Ht 64.5 in | Wt 273.0 lb

## 2015-05-24 DIAGNOSIS — E119 Type 2 diabetes mellitus without complications: Secondary | ICD-10-CM | POA: Diagnosis not present

## 2015-05-24 DIAGNOSIS — I10 Essential (primary) hypertension: Secondary | ICD-10-CM

## 2015-05-24 DIAGNOSIS — F329 Major depressive disorder, single episode, unspecified: Secondary | ICD-10-CM | POA: Diagnosis not present

## 2015-05-24 DIAGNOSIS — F32A Depression, unspecified: Secondary | ICD-10-CM

## 2015-05-24 DIAGNOSIS — E785 Hyperlipidemia, unspecified: Secondary | ICD-10-CM

## 2015-05-24 DIAGNOSIS — Z79899 Other long term (current) drug therapy: Secondary | ICD-10-CM

## 2015-05-24 LAB — POCT GLYCOSYLATED HEMOGLOBIN (HGB A1C)

## 2015-05-24 MED ORDER — CANAGLIFLOZIN 100 MG PO TABS: ORAL_TABLET | ORAL | Status: DC

## 2015-05-24 MED ORDER — ESCITALOPRAM OXALATE 10 MG PO TABS: 10.0000 mg | ORAL_TABLET | Freq: Every day | ORAL | Status: DC

## 2015-05-24 NOTE — Progress Notes (Signed)
   Subjective:    Patient ID: [name], female    DOB: 1955-12-26, 60 y.o.   MRN: [id number]  Diabetes She presents for her follow-up diabetic visit. She has type 2 diabetes mellitus. She is following a diabetic diet. She never participates in exercise. Her breakfast blood glucose range is generally 110-130 mg/dl. She does not see a podiatrist.Eye exam is not current (about 2 years ago).   Results for orders placed or performed in visit on 05/24/15  POCT glycosylated hemoglobin (Hb A1C)  Result Value Ref Range   Hemoglobin A1C 8.68f     Pt states she has been feeling depressed lately., feeling down at times, experiencing night sweats at times.no suicidal or homicidal thoughts. Daughter's not very accessible these days heillness [name] patient feels "intumescent number    Claims compliance with blood pressure medicine. Watch his salt intake is Now excess salt. Does not miss a dose side effects  Unfortunately skipping meals somewhat frequently. Only to thisdespiteshe'sdiabetesmedicines Review of Systems No headache, no major weight loss or weight gain, no chest pain no back pain abdominal pain no change in bowel habits complete ROS otherwise negative     Objective:   Physical Exam Alert vitals stable blood pressure good on repeat HEENT normal substantial obesity lungs clear heart without edema       Assessment & Plan:  Impression#1 type 2 diabetes suboptimum control. Numbers continue to remain high despite our best efforts. Discussed #2 hypertension good control maintain same dose #3 depression discussed partly situational but time the press on with therapy impacted on patient considerably #4 hyperlipidemia and discuss exact current status uncertain old blood work reviewed plan all medications refilled. Increase in foci on it to 200 mg every morning. Diet exercise discussed. Initiate Lexapro rationale discussed. Endocrinology referral [facility] patient states she prefers a female  endocrinologist because the last 2 been female and would not listen to her

## 2015-05-25 ENCOUNTER — Encounter: Payer: Self-pay | Admitting: Family Medicine

## 2015-05-28 ENCOUNTER — Encounter: Payer: Self-pay | Admitting: Family Medicine

## 2015-05-30 ENCOUNTER — Other Ambulatory Visit: Payer: Self-pay | Admitting: Family Medicine

## 2015-06-02 ENCOUNTER — Other Ambulatory Visit: Payer: Self-pay | Admitting: Cardiology

## 2015-06-02 DIAGNOSIS — E119 Type 2 diabetes mellitus without complications: Secondary | ICD-10-CM | POA: Diagnosis not present

## 2015-06-02 DIAGNOSIS — Z79899 Other long term (current) drug therapy: Secondary | ICD-10-CM | POA: Diagnosis not present

## 2015-06-02 DIAGNOSIS — E785 Hyperlipidemia, unspecified: Secondary | ICD-10-CM | POA: Diagnosis not present

## 2015-06-02 DIAGNOSIS — I10 Essential (primary) hypertension: Secondary | ICD-10-CM | POA: Diagnosis not present

## 2015-06-03 ENCOUNTER — Encounter: Payer: Self-pay | Admitting: Family Medicine

## 2015-06-03 LAB — LIPID PANEL
CHOL/HDL RATIO: 2.3 ratio (ref 0.0–4.4)
CHOLESTEROL TOTAL: 179 mg/dL (ref 100–199)
HDL: 77 mg/dL (ref >39)
LDL CALC: 87 mg/dL (ref 0–99)
TRIGLYCERIDES: 74 mg/dL (ref 0–149)
VLDL Cholesterol Cal: 15 mg/dL (ref 5–40)

## 2015-06-03 LAB — MICROALBUMIN / CREATININE URINE RATIO
CREATININE, UR: 63.4 mg/dL
MICROALB/CREAT RATIO: 45.6 mg/g creat — ABNORMAL HIGH (ref 0.0–30.0)
Microalbumin, Urine: 28.9 ug/mL

## 2015-06-03 LAB — BASIC METABOLIC PANEL
BUN / CREAT RATIO: 15 (ref 12–28)
BUN: 14 mg/dL (ref 8–27)
CALCIUM: 9.6 mg/dL (ref 8.7–10.3)
CHLORIDE: 105 mmol/L (ref 96–106)
CO2: 22 mmol/L (ref 18–29)
Creatinine, Ser: 0.96 mg/dL (ref 0.57–1.00)
GFR calc non Af Amer: 64 mL/min/{1.73_m2} (ref >59)
GFR, EST AFRICAN AMERICAN: 74 mL/min/{1.73_m2} (ref >59)
GLUCOSE: 142 mg/dL — AB (ref 65–99)
POTASSIUM: 4.2 mmol/L (ref 3.5–5.2)
Sodium: 145 mmol/L — ABNORMAL HIGH (ref 134–144)

## 2015-06-03 LAB — HEPATIC FUNCTION PANEL
ALT: 10 IU/L (ref 0–32)
AST: 17 IU/L (ref 0–40)
Albumin: 4.7 g/dL (ref 3.6–4.8)
Alkaline Phosphatase: 85 IU/L (ref 39–117)
BILIRUBIN, DIRECT: 0.1 mg/dL (ref 0.00–0.40)
Bilirubin Total: 0.3 mg/dL (ref 0.0–1.2)
Total Protein: 7.3 g/dL (ref 6.0–8.5)

## 2015-06-08 DIAGNOSIS — Z853 Personal history of malignant neoplasm of breast: Secondary | ICD-10-CM | POA: Diagnosis not present

## 2015-06-18 ENCOUNTER — Other Ambulatory Visit: Payer: Self-pay | Admitting: Oncology

## 2015-06-22 DIAGNOSIS — I10 Essential (primary) hypertension: Secondary | ICD-10-CM | POA: Diagnosis not present

## 2015-06-22 DIAGNOSIS — E039 Hypothyroidism, unspecified: Secondary | ICD-10-CM | POA: Diagnosis not present

## 2015-06-22 DIAGNOSIS — E1165 Type 2 diabetes mellitus with hyperglycemia: Secondary | ICD-10-CM | POA: Diagnosis not present

## 2015-06-22 DIAGNOSIS — E7800 Pure hypercholesterolemia, unspecified: Secondary | ICD-10-CM | POA: Diagnosis not present

## 2015-06-26 DIAGNOSIS — E119 Type 2 diabetes mellitus without complications: Secondary | ICD-10-CM | POA: Diagnosis not present

## 2015-08-06 IMAGING — CR DG CHEST 1V PORT
1 series · 1 of 1 positions shown · non-contrast
Comparison: September 06, 2013

CLINICAL DATA: Chest pain and shortness of breath for 1 day

EXAM:
PORTABLE CHEST - 1 VIEW

[AP]
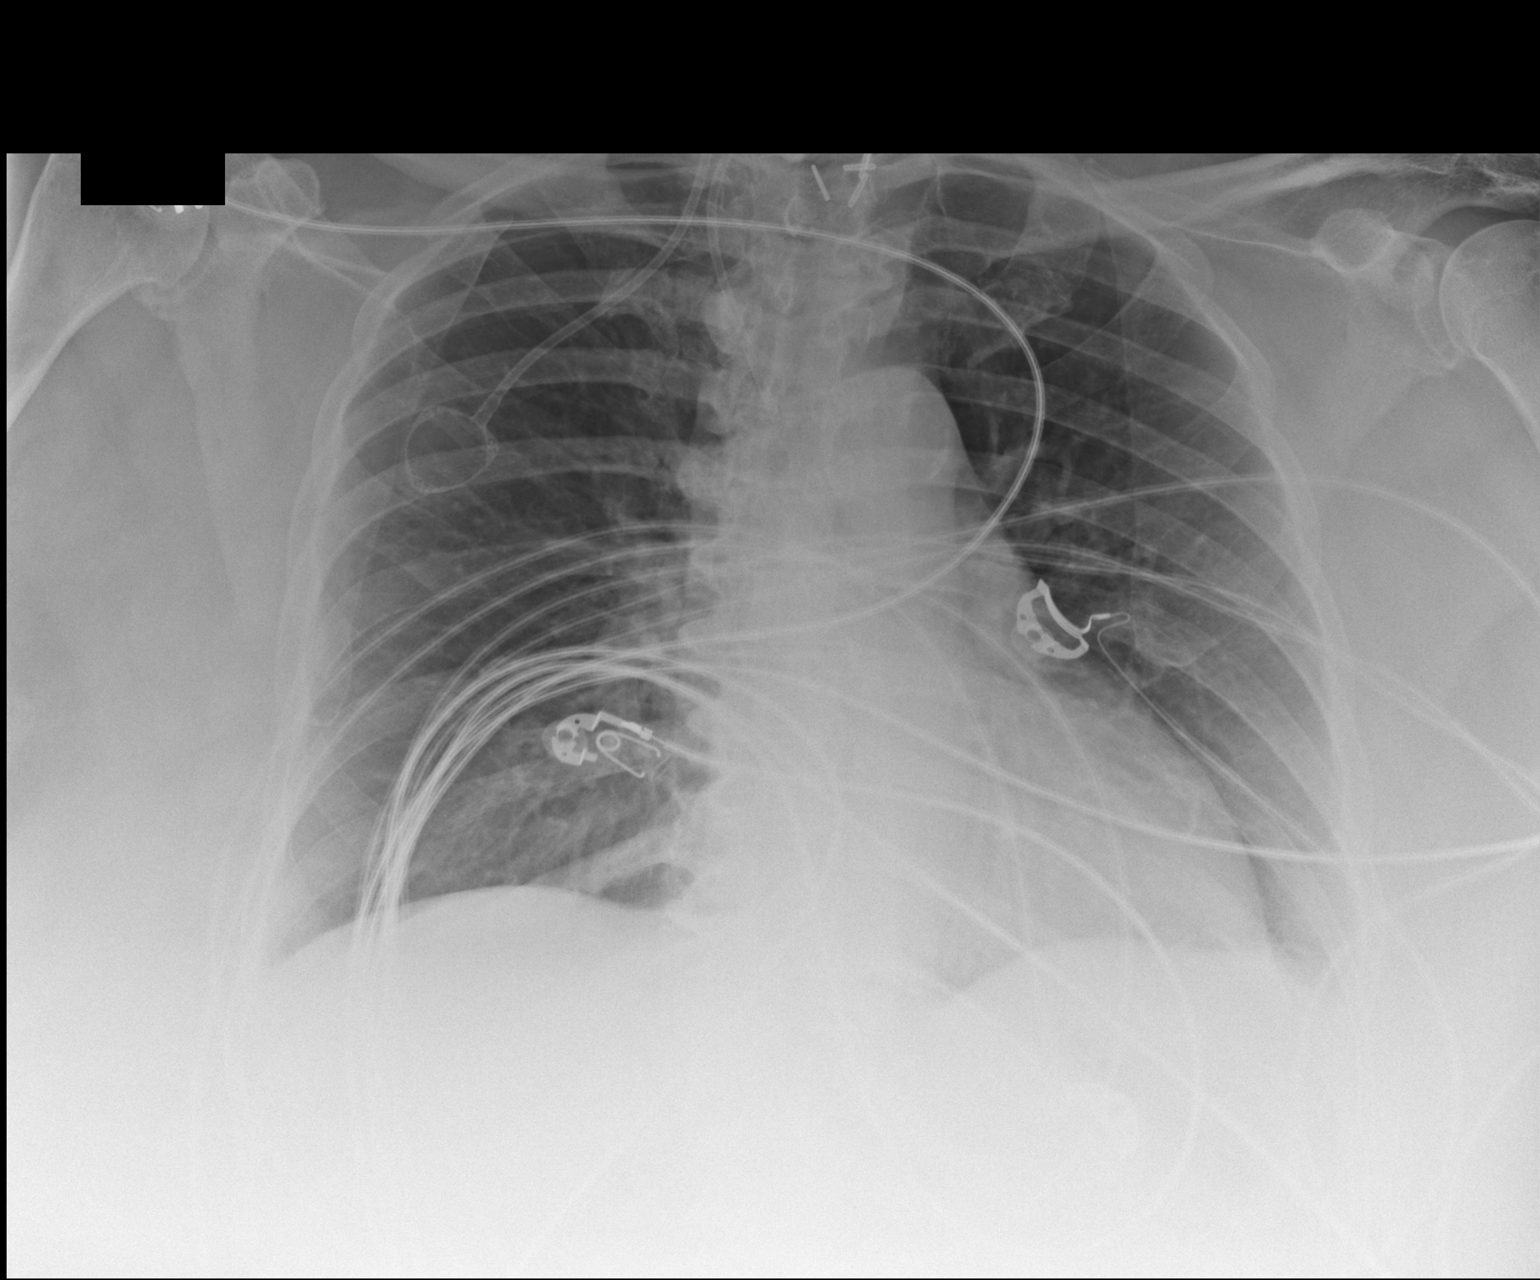

[1 of 1 positions shown; findings below may reference images not displayed]

FINDINGS: The heart size and mediastinal contours are stable. Right central
venous line is unchanged. Surgical clips are identified in the
superior mediastinum. There is no focal infiltrate, pulmonary edema,
or pleural effusion. The visualized skeletal structures are stable.
IMPRESSION: No active cardiopulmonary disease.

## 2015-08-27 ENCOUNTER — Other Ambulatory Visit: Payer: Self-pay | Admitting: Family Medicine

## 2015-08-29 ENCOUNTER — Encounter: Payer: Self-pay | Admitting: Family Medicine

## 2015-08-29 ENCOUNTER — Ambulatory Visit (INDEPENDENT_AMBULATORY_CARE_PROVIDER_SITE_OTHER): Payer: Medicare Other | Admitting: Family Medicine

## 2015-08-29 VITALS — BP 118/80 | Ht 64.5 in | Wt 269.5 lb

## 2015-08-29 DIAGNOSIS — S40022A Contusion of left upper arm, initial encounter: Secondary | ICD-10-CM | POA: Diagnosis not present

## 2015-08-29 NOTE — Progress Notes (Signed)
   Subjective:    Patient ID: [name], female    DOB: November 16, 1955, 60 y.o.   MRN: [id number]  HPI Patient in today for bruising and knot that has appeared to left wrist. Onset of symptoms last week. Has tried warm compresses.   States no other concerns this visit.  Patient on anticoagulation  Struck left arm. Developed bruise last week. Now central nodule. Family very concerned about Review of Systems No headache, no major weight loss or weight gain, no chest pain no back pain abdominal pain no change in bowel habits complete ROS otherwise negative     Objective:   Physical Exam  Alert vitals stable, NAD. Blood pressure good on repeat. HEENT normal. Lungs clear. Heart regular rate and rhythm. Left anterior arm palpable nodule at site of recent hematoma      Assessment & Plan:  Impression organizing hematoma with secondary central nodule plan reassurance expect slow resolution [facility]

## 2015-08-30 ENCOUNTER — Other Ambulatory Visit: Payer: Self-pay | Admitting: Family Medicine

## 2015-09-03 ENCOUNTER — Other Ambulatory Visit: Payer: Self-pay | Admitting: Family Medicine

## 2015-09-03 ENCOUNTER — Other Ambulatory Visit: Payer: Medicare Other

## 2015-09-03 ENCOUNTER — Ambulatory Visit: Payer: Medicare Other | Admitting: Nurse Practitioner

## 2015-09-06 ENCOUNTER — Other Ambulatory Visit (HOSPITAL_BASED_OUTPATIENT_CLINIC_OR_DEPARTMENT_OTHER): Payer: Medicare Other

## 2015-09-06 ENCOUNTER — Telehealth: Payer: Self-pay | Admitting: Adult Health

## 2015-09-06 ENCOUNTER — Encounter: Payer: Self-pay | Admitting: Adult Health

## 2015-09-06 ENCOUNTER — Encounter: Payer: Self-pay | Admitting: *Deleted

## 2015-09-06 ENCOUNTER — Ambulatory Visit (HOSPITAL_BASED_OUTPATIENT_CLINIC_OR_DEPARTMENT_OTHER): Payer: Medicare Other | Admitting: Adult Health

## 2015-09-06 VITALS — BP 176/83 | HR 79 | Temp 98.5°F | Resp 18 | Ht 64.5 in | Wt 271.5 lb

## 2015-09-06 DIAGNOSIS — C50911 Malignant neoplasm of unspecified site of right female breast: Secondary | ICD-10-CM

## 2015-09-06 DIAGNOSIS — E119 Type 2 diabetes mellitus without complications: Secondary | ICD-10-CM | POA: Diagnosis not present

## 2015-09-06 DIAGNOSIS — C50411 Malignant neoplasm of upper-outer quadrant of right female breast: Secondary | ICD-10-CM

## 2015-09-06 DIAGNOSIS — M2550 Pain in unspecified joint: Secondary | ICD-10-CM

## 2015-09-06 DIAGNOSIS — Z87891 Personal history of nicotine dependence: Secondary | ICD-10-CM

## 2015-09-06 DIAGNOSIS — R7989 Other specified abnormal findings of blood chemistry: Secondary | ICD-10-CM

## 2015-09-06 DIAGNOSIS — C773 Secondary and unspecified malignant neoplasm of axilla and upper limb lymph nodes: Secondary | ICD-10-CM

## 2015-09-06 LAB — CBC WITH DIFFERENTIAL/PLATELET
BASO%: 0.2 % (ref 0.0–2.0)
Basophils Absolute: 0 10*3/uL (ref 0.0–0.1)
EOS%: 2.7 % (ref 0.0–7.0)
Eosinophils Absolute: 0.1 10*3/uL (ref 0.0–0.5)
HCT: 36.2 % (ref 34.8–46.6)
HEMOGLOBIN: 11.9 g/dL (ref 11.6–15.9)
LYMPH%: 31.5 % (ref 14.0–49.7)
MCH: 27.4 pg (ref 25.1–34.0)
MCHC: 32.9 g/dL (ref 31.5–36.0)
MCV: 83.4 fL (ref 79.5–101.0)
MONO#: 0.4 10*3/uL (ref 0.1–0.9)
MONO%: 7.6 % (ref 0.0–14.0)
NEUT%: 58 % (ref 38.4–76.8)
NEUTROS ABS: 2.8 10*3/uL (ref 1.5–6.5)
Platelets: 184 10*3/uL (ref 145–400)
RBC: 4.34 10*6/uL (ref 3.70–5.45)
RDW: 16.4 % — AB (ref 11.2–14.5)
WBC: 4.9 10*3/uL (ref 3.9–10.3)
lymph#: 1.5 10*3/uL (ref 0.9–3.3)

## 2015-09-06 LAB — COMPREHENSIVE METABOLIC PANEL
ALBUMIN: 3.8 g/dL (ref 3.5–5.0)
ALK PHOS: 87 U/L (ref 40–150)
ALT: 14 U/L (ref 0–55)
AST: 19 U/L (ref 5–34)
Anion Gap: 10 mEq/L (ref 3–11)
BILIRUBIN TOTAL: 0.39 mg/dL (ref 0.20–1.20)
BUN: 23.2 mg/dL (ref 7.0–26.0)
CALCIUM: 9.6 mg/dL (ref 8.4–10.4)
CO2: 25 mEq/L (ref 22–29)
Chloride: 109 mEq/L (ref 98–109)
Creatinine: 1.6 mg/dL — ABNORMAL HIGH (ref 0.6–1.1)
EGFR: 40 mL/min/{1.73_m2} — AB (ref >90)
GLUCOSE: 149 mg/dL — AB (ref 70–140)
POTASSIUM: 3.7 meq/L (ref 3.5–5.1)
SODIUM: 143 meq/L (ref 136–145)
TOTAL PROTEIN: 7.5 g/dL (ref 6.4–8.3)

## 2015-09-06 NOTE — Patient Instructions (Signed)
Call Dr. Wolfgang Phoenix and Dr. Almetta Lovely office about your increased kidney numbers on your lab work.   Call me with any questions! Mike Craze, NP [place] [phone]

## 2015-09-06 NOTE — Telephone Encounter (Signed)
appt made and avs printed °

## 2015-09-06 NOTE — Progress Notes (Signed)
09/06/15 at 2:58pm  -B-51 study notes- The pt was into the cancer center for her 24 months after randomization visit.  The pt was given her BAHO (QOL/PRO's) to complete upon arrival to the cancer center.  The pt completed her questionnaires before any study procedures were completed.  The research nurse reviewed her [place] for completeness and accuracy.  The pt had routine labs drawn, and she was seen by Mike Craze, Dr. Virgie Dad NP.  The pt denies any new problems.  The pt reported started a new diabetic medication 3 months ago.  The pt's creatinine was elevated, and the NP encouraged the pt to follow up with her PCP and her endocrinologist for further evaluation.  It was felt that her creatinine elevation could be related to her diabetic medication, Invokana.  The pt's last mammogram was negative for malignancy.  The pt is continuing to take her anastrozole daily.  The NP stated the pt is "without evidence of diease or recurrence of breast cancer". The pt was thanked for her continued support of this trial. Brion Aliment RN, BSN, CCRP Clinical Research Nurse 09/06/2015 3:07 PM

## 2015-09-06 NOTE — Progress Notes (Signed)
CLINIC:  Survivorship   REASON FOR VISIT:  Routine follow-up for history of breast Jackson.   BRIEF ONCOLOGIC HISTORY:  (from previous visit with Dr. Jana Hakim on 03/06/15)   INTERVAL HISTORY:  [name] presents to the [facility] today for routine follow-up for her history of breast Jackson.  Overall, she reports feeling quite well.  She continue son the anastrazole.  She has quite a bit of chronic joint pain, particularly in her knees which also affects her mobility significantly.  She walks with a walker.  She does not engage in regular exercise, but she is independent in caring for herself. She drinks plenty of water during the day. She does have diabetes and tells me that her endocrinologist has recently started her on canagliflozin (Invokana) to try to get better control of her Hgb A1c.   Otherwise, she feels great. Her appetite is good, her energy levels are good. "I don't let anything stop me from doing what I want to do."    Dr. Chalmers Cater is her endocrinologist. Dr. Baltazar Apo is her primary care physician.   REVIEW OF SYSTEMS:  Review of Systems  Constitutional: Negative.   HENT: Negative.   Eyes: Negative.   Respiratory: Negative.   Cardiovascular: Negative.   Gastrointestinal: Negative.   Genitourinary: Negative.   Musculoskeletal: Positive for joint pain and myalgias.  Skin: Negative.   Neurological: Negative.   Endo/Heme/Allergies:       (+) diabetes  Psychiatric/Behavioral: Negative.   GU: Denies vaginal bleeding, discharge, or dryness.  Breast: Denies any new nodularity, masses, tenderness, nipple changes, or nipple discharge.    A 14-point review of systems was completed and was negative, except as noted above.    PAST MEDICAL/SURGICAL HISTORY:  Past Medical History:  Diagnosis Date  . Asthma    prn neb. and inhaler  . Breast Jackson ([place]) 01/2013   right  . Dehydration 04/13/2013  . GERD (gastroesophageal reflux disease)   . Graves' disease   .  Hyperlipidemia   . Hypertension    on multiple meds., has been on med. > 14 yr.  . Non-insulin dependent type 2 diabetes mellitus ([place])   . Obesity   . Ophthalmic manifestation of Graves disease   . Osteoarthritis 2003   left knee  . Radiation 10/11/13-11/29/13   Right Breast  . Sleep apnea 4/16   mod  . Wears glasses    Past Surgical History:  Procedure Laterality Date  . CARDIOVERSION N/A 11/08/2013   Procedure: CARDIOVERSION;  Surgeon: Lelon Perla, MD;  Location: [facility] ENDOSCOPY;  Service: Cardiovascular;  Laterality: N/A;  . COLONOSCOPY  2007  . COLONOSCOPY W/ POLYPECTOMY  2009  . EXCISION OF KELOID N/A 05/18/2014   Procedure: EXCISION OF KELOID;  Surgeon: Erroll Luna, MD;  Location: [place];  Service: General;  Laterality: N/A;  . PORT-A-CATH REMOVAL Right 05/18/2014   Procedure: REMOVAL PORT-A-CATH;  Surgeon: Erroll Luna, MD;  Location: [place];  Service: General;  Laterality: Right;  . PORTACATH PLACEMENT Right 02/17/2013   Procedure: INSERTION PORT-A-CATH;  Surgeon: Joyice Faster. Cornett, MD;  Location: [place];  Service: General;  Laterality: Right;  . TEE WITHOUT CARDIOVERSION N/A 11/08/2013   Procedure: TRANSESOPHAGEAL ECHOCARDIOGRAM (TEE);  Surgeon: Lelon Perla, MD;  Location: [place];  Service: Cardiovascular;  Laterality: N/A;  . TOTAL KNEE ARTHROPLASTY Left 2003  . TOTAL THYROIDECTOMY    . TUBAL LIGATION  1985     ALLERGIES:  Allergies  Allergen Reactions  . Procardia [Nifedipine] Other (See Comments)    MIGRAINES  . Aspirin     Unable to take due to blood pressure medication  . Diltiazem Itching     CURRENT MEDICATIONS:  Outpatient Encounter Prescriptions as of 09/06/2015  Medication Sig Note  . ACCU-CHEK SMARTVIEW test strip USE ONE TEST STRIP TO CHECK BLOOD SUGAR 4 TIMES DAILY   . acetaminophen (TYLENOL) 325 MG tablet Take 2 tablets (650 mg total) by mouth every 6 (six) hours as needed for  mild pain or headache.   . albuterol (PROVENTIL HFA;VENTOLIN HFA) 108 (90 BASE) MCG/ACT inhaler Inhale 2 puffs into the lungs every 4 (four) hours as needed.   Marland Kitchen amLODipine (NORVASC) 10 MG tablet Take 1 tablet (10 mg total) by mouth daily.   Marland Kitchen anastrozole (ARIMIDEX) 1 MG tablet TAKE ONE TABLET BY MOUTH ONCE DAILY.   Marland Kitchen apixaban (ELIQUIS) 5 MG TABS tablet Take 1 tablet (5 mg total) by mouth 2 (two) times daily.   . Artificial Tear Ointment (ARTIFICIAL TEARS) ointment Place 1 drop into both eyes as needed (for grey eye disease).    . benazepril (LOTENSIN) 40 MG tablet TAKE ONE TABLET BY MOUTH ONCE DAILY.   Marland Kitchen Biotin 1000 MCG tablet Take 1,000 mcg by mouth daily.   . calcium carbonate (TUMS EX) 750 MG chewable tablet Chew 2 tablets by mouth 4 (four) times daily.   . canagliflozin (INVOKANA) 100 MG TABS tablet Take 2 tablets qd 08/29/2015: Takes 300 Mg once daily   . carvedilol (COREG) 12.5 MG tablet TAKE (1) TABLET BY MOUTH TWICE DAILY.   . cholecalciferol (VITAMIN D) 1000 UNITS tablet Take 1,000 Units by mouth daily.   Marland Kitchen escitalopram (LEXAPRO) 10 MG tablet Take 1 tablet (10 mg total) by mouth daily.   Marland Kitchen gabapentin (NEURONTIN) 100 MG capsule Take 1 capsule (100 mg total) by mouth 3 (three) times daily.   Marland Kitchen levothyroxine (SYNTHROID, LEVOTHROID) 88 MCG tablet TAKE ONE TABLET BY MOUTH ONCE DAILY.   Marland Kitchen loratadine (CLARITIN) 10 MG tablet Take 1 tablet (10 mg total) by mouth daily.   . pravastatin (PRAVACHOL) 80 MG tablet Take 40 mg by mouth daily.   Marland Kitchen albuterol (PROVENTIL) (2.5 MG/3ML) 0.083% nebulizer solution Take 3 mLs (2.5 mg total) by nebulization every 6 (six) hours as needed for wheezing or shortness of breath.   . [DISCONTINUED] metFORMIN (GLUCOPHAGE) 500 MG tablet Take 2 tablets (1,000 mg total) by mouth 2 (two) times daily with a meal. (Patient not taking: Reported on 08/29/2015)    Facility-Administered Encounter Medications as of 09/06/2015  Medication  . methylPREDNISolone acetate (DEPO-MEDROL)  injection 40 mg     ONCOLOGIC FAMILY HISTORY:  Family History  Problem Relation Age of Onset  . Heart disease Father   . Lung Jackson Father   . Diabetes Mother   . Diabetes Sister   . Hypertension Sister   . Asthma Sister     GENETIC COUNSELING/TESTING: None.  SOCIAL HISTORY:  GLENDON FISER is divorced and lives in [place], [place].  She has 2 children.  Ms. Acres is currently not able to work and is on disability.  She is a former smoker (smoked about 1/4 ppd x 20 years); quit 1993.  She denies any alcohol and illicit drug use.    PHYSICAL EXAMINATION:  Vital Signs: Vitals:   09/06/15 1415 09/06/15 1506  BP: (!) 149/99 (!) 176/83  Pulse: 79   Resp: 18   Temp: 98.5 F (36.9 C)  Filed Weights   09/06/15 1415  Weight: 271 lb 8 oz (123.2 kg)   General: Well-nourished, well-appearing female in no acute distress.  She is unaccompanied today.  Exam done with patient seated in chair. Doristine Johns, RN present for visit and exam. HEENT: Head is normocephalic.  Pupils equal and reactive to light and accomodation. Conjunctivae clear without exudate.  Sclerae anicteric. Oral mucosa is pink, moist.  Oropharynx is pink without lesions or erythema.  Lymph: No cervical, supraclavicular, or infraclavicular lymphadenopathy noted on palpation.  Cardiovascular: Regular rate and rhythm.Marland Kitchen Respiratory: Clear to auscultation bilaterally. Chest expansion symmetric; breathing non-labored.  Breast Exam (done with pt seated in chair as she is unable to get on exam table given decreased knee mobility):  -Left breast: No appreciable masses on palpation. No skin redness, thickening, or peau d'orange appearance; no nipple retraction or nipple discharge.  -Right breast: No appreciable masses on palpation. (+) linear cord-like scar tissue/fibrosis at 10 o'clock position; no mass palpated.  (+) Post-radiation breast changes.  Mild distortion in symmetry at previous lumpectomy site; healed scar without  erythema or nodularity. -Axilla: No axillary adenopathy bilaterally.  GI: Abdomen soft and round; non-tender, non-distended. Bowel sounds normoactive. No hepatosplenomegaly.   GU: Deferred.  Neuro/MSK: No focal deficits. (L) knee "locked" in straight position, marked decreased mobility to left knee.  Psych: Mood and affect normal and appropriate for situation.  Extremities: No edema. Skin: Warm and dry.  LABORATORY DATA:  CMP Latest Ref Rng & Units 09/06/2015 06/02/2015 03/06/2015  Glucose 70 - 140 mg/dl 149(H) 142(H) 167(H)  BUN 7.0 - 26.0 mg/dL 23.2 14 15.7  Creatinine 0.6 - 1.1 mg/dL 1.6(H) 0.96 1.2(H)  Sodium 136 - 145 mEq/L 143 145(H) 145  Potassium 3.5 - 5.1 mEq/L 3.7 4.2 4.2  Chloride 96 - 106 mmol/L - 105 -  CO2 22 - 29 mEq/L 25 22 21(L)  Calcium 8.4 - 10.4 mg/dL 9.6 9.6 9.6  Total Protein 6.4 - 8.3 g/dL 7.5 7.3 8.0  Total Bilirubin 0.20 - 1.20 mg/dL 0.39 0.3 0.47  Alkaline Phos 40 - 150 U/L 87 85 90  AST 5 - 34 U/L 19 17 21   ALT 0 - 55 U/L 14 10 18    CBC    Component Value Date/Time   WBC 4.9 09/06/2015 1343   WBC 4.5 04/07/2015 2321   RBC 4.34 09/06/2015 1343   RBC 4.24 04/07/2015 2321   HGB 11.9 09/06/2015 1343   HCT 36.2 09/06/2015 1343   PLT 184 09/06/2015 1343   MCV 83.4 09/06/2015 1343   MCH 27.4 09/06/2015 1343   MCH 27.4 04/07/2015 2321   MCHC 32.9 09/06/2015 1343   MCHC 32.9 04/07/2015 2321   RDW 16.4 (H) 09/06/2015 1343   LYMPHSABS 1.5 09/06/2015 1343   MONOABS 0.4 09/06/2015 1343   EOSABS 0.1 09/06/2015 1343   BASOSABS 0.0 09/06/2015 1343      DIAGNOSTIC IMAGING:  Most recent mammogram:    ASSESSMENT AND PLAN:  [name] is a pleasant 60 y.o. female with history of Stage IA right breast invasive ductal carcinoma, ER+/PR+/HER2+, diagnosed in 01/2013, treated with neoadjuvant chemotherapy with Taxotere/Carboplatin/Herceptin/Perjeta x 4 cycles (last 2 cycles held d/t multiple electrolyte and renal abnormalities), followed by lumpectomy, adjuvant  radiation therapy, and anti-estrogen therapy with anastrazole beginning in 12/2013.  [facility] for surveillance and routine follow-up.   1. History of Stage IA right breast Jackson:  [name] is currently clinically and radiographically without evidence of disease  or recurrence of breast Jackson. She has completed 2 years on the B-51 trial; she was randomized to not receive nodal radiation after surgery.  She will follow-up with her medical oncologist, Dr. Jana Hakim, in 02/2016 with history and physical exam per surveillance protocol.  She will continue her anti-estrogen therapy with anastrazole as prescribed by Dr. Jana Hakim.  I encouraged her to call me with any questions or concerns and I would happy to see her sooner, if needed.   2. Renal impairment, ? secondary to new diabetes medication:  [name] has had a marked increase in her creatinine from 3 months ago from 0.96 to now 1.6.  Her EGFR is also markedly decreased as well, down to 40 from 74 back in 05/2012.  I discussed with her that many times this more acute rise in creatinine can be related to medications.  She tells me that she started the [place] about 3 months ago and will be seeing her endocrinologist again soon for routine follow-up.  Based on these lab results today, I encouraged [name] to call her endocrinologist as well as her PCP to make them aware of her abnormal kidney function lab work today.  We will obviously defer to their expertise on this, but I will forward this note to both Dr. Chalmers Cater and Dr. Wolfgang Phoenix to make them aware.     3. Bone health:  Given [name]' age, history of breast Jackson, and her current treatment regimen including chemoprevention with anastrazole, she is at risk for bone demineralization.  Her last DEXA scan was on 01/17/14 and was normal.  She was encouraged to increase her consumption of foods rich in calcium, as well as increase her weight-bearing activities.  She was given  education on specific food and activities to promote bone health.  4. Health maintenance and wellness promotion: [name] was encouraged to consume 5-7 servings of fruits and vegetables per day. She was also encouraged to engage in moderate to vigorous exercise for 30 minutes per day most days of the week, as tolerated given her limited mobitily. She was instructed to limit her alcohol consumption and continue to abstain from tobacco use.    Dispo:  -Return to [facility] to see Dr. Jana Hakim in 02/2016 -Annual mammogram due in 04/2016; orders placed today.    A total of 30 minutes of face-to-face time was spent with this patient with greater than 50% of that time in counseling and care-coordination.   Mike Craze, NP [facility] [phone]   Note: PRIMARY CARE PROVIDER Mickie Hillier, [place] [phone]

## 2015-09-07 ENCOUNTER — Telehealth: Payer: Self-pay | Admitting: *Deleted

## 2015-09-07 NOTE — Telephone Encounter (Signed)
Called pt to let her know appt has been set for mammo at [facility] for April 20,2018 at 11:30. Pt verbalizes understanding . No further concerns. Message to be fwd to [employer].

## 2015-09-18 ENCOUNTER — Other Ambulatory Visit: Payer: Self-pay | Admitting: Oncology

## 2015-09-21 ENCOUNTER — Other Ambulatory Visit: Payer: Self-pay | Admitting: Oncology

## 2015-09-22 DIAGNOSIS — E119 Type 2 diabetes mellitus without complications: Secondary | ICD-10-CM | POA: Diagnosis not present

## 2015-09-24 DIAGNOSIS — E1165 Type 2 diabetes mellitus with hyperglycemia: Secondary | ICD-10-CM | POA: Diagnosis not present

## 2015-09-24 DIAGNOSIS — I10 Essential (primary) hypertension: Secondary | ICD-10-CM | POA: Diagnosis not present

## 2015-09-24 DIAGNOSIS — E890 Postprocedural hypothyroidism: Secondary | ICD-10-CM | POA: Diagnosis not present

## 2015-09-24 DIAGNOSIS — E7800 Pure hypercholesterolemia, unspecified: Secondary | ICD-10-CM | POA: Diagnosis not present

## 2015-09-26 ENCOUNTER — Other Ambulatory Visit: Payer: Self-pay | Admitting: Family Medicine

## 2015-10-25 ENCOUNTER — Other Ambulatory Visit: Payer: Self-pay | Admitting: Family Medicine

## 2015-10-25 DIAGNOSIS — E119 Type 2 diabetes mellitus without complications: Secondary | ICD-10-CM | POA: Diagnosis not present

## 2015-10-31 ENCOUNTER — Other Ambulatory Visit: Payer: Self-pay | Admitting: Oncology

## 2015-10-31 ENCOUNTER — Other Ambulatory Visit: Payer: Self-pay | Admitting: Cardiology

## 2015-10-31 ENCOUNTER — Other Ambulatory Visit: Payer: Self-pay | Admitting: Family Medicine

## 2015-10-31 DIAGNOSIS — C50411 Malignant neoplasm of upper-outer quadrant of right female breast: Secondary | ICD-10-CM

## 2015-11-22 ENCOUNTER — Ambulatory Visit (INDEPENDENT_AMBULATORY_CARE_PROVIDER_SITE_OTHER): Payer: Medicare Other | Admitting: Family Medicine

## 2015-11-22 ENCOUNTER — Encounter: Payer: Self-pay | Admitting: Family Medicine

## 2015-11-22 VITALS — BP 134/90 | Ht 65.0 in | Wt 283.2 lb

## 2015-11-22 DIAGNOSIS — I10 Essential (primary) hypertension: Secondary | ICD-10-CM

## 2015-11-22 DIAGNOSIS — E0590 Thyrotoxicosis, unspecified without thyrotoxic crisis or storm: Secondary | ICD-10-CM | POA: Diagnosis not present

## 2015-11-22 DIAGNOSIS — E039 Hypothyroidism, unspecified: Secondary | ICD-10-CM

## 2015-11-22 DIAGNOSIS — E785 Hyperlipidemia, unspecified: Secondary | ICD-10-CM | POA: Diagnosis not present

## 2015-11-22 DIAGNOSIS — E119 Type 2 diabetes mellitus without complications: Secondary | ICD-10-CM

## 2015-11-22 DIAGNOSIS — Z23 Encounter for immunization: Secondary | ICD-10-CM

## 2015-11-22 MED ORDER — TRAMADOL HCL 50 MG PO TABS: ORAL_TABLET | ORAL | 5 refills | Status: DC

## 2015-11-22 NOTE — Progress Notes (Signed)
   Subjective:    Patient ID: [name], female    DOB: 10/21/1955, 60 y.o.   MRN: [id number]  Hypertension  This is a chronic problem. The current episode started more than 1 year ago. There are no compliance problems.   Blood pressure medicine and blood pressure levels reviewed today with patient. Compliant with blood pressure medicine. States does not miss a dose. No obvious side effects. Blood pressure generally good when checked elsewhere. Watching salt intake.  Vent using may be pone and a half times per wk average''  Hx of knee arthritis and ortho. s p total left knee replacement, right knee is "bone on bone"  Back acting up and bothering pt.  Patient has concerns of back pain., Generally worse with motion.  Patient notes ongoing compliance with antidepressant medication. No obvious side effects. Reports does not miss a dose. Overall continues to help depression substantially. No thoughts of homicide or suicide. Would like to maintain medication.  Reactive airways overall stable. Uses albuterol when necessary. Only occasionally.  Compliant with thyroid medicine. No symptoms of pyelonephritis thyroid.  Review of Systems No headache, no major weight loss or weight gain, no chest pain no back pain abdominal pain no change in bowel habits complete ROS otherwise negative     Objective:   Physical Exam Alert vitals stable, NAD. Blood pressure good on repeat. HEENT normal. Lungs clear. Heart regular rate and rhythm. Substantial obesity present. Morbid obesity. Thyroid nonpalpable       Assessment & Plan:  Impression 1 hypertension good control discussed maintain same meds #2 hypothyroidism compliance meds discussed #3 hyperlipidemia prior blood work discussed check and follow-up on current results #4 depression clinically stable to maintain same medications #5 intermittent worsening joint pain. Trial of Ultram daily at bedtime to help with the pain. Further recommendations  based on blood work results. Flu shot today. Follow-up in 6 months

## 2015-11-30 ENCOUNTER — Other Ambulatory Visit: Payer: Self-pay | Admitting: Family Medicine

## 2015-12-18 ENCOUNTER — Other Ambulatory Visit: Payer: Self-pay

## 2015-12-18 MED ORDER — GLUCOSE BLOOD VI STRP: ORAL_STRIP | 5 refills | Status: DC

## 2015-12-19 DIAGNOSIS — E119 Type 2 diabetes mellitus without complications: Secondary | ICD-10-CM | POA: Diagnosis not present

## 2015-12-20 ENCOUNTER — Other Ambulatory Visit: Payer: Self-pay | Admitting: Oncology

## 2015-12-21 NOTE — Telephone Encounter (Signed)
Chart reviewed.

## 2015-12-26 ENCOUNTER — Other Ambulatory Visit: Payer: Self-pay | Admitting: Family Medicine

## 2015-12-31 ENCOUNTER — Other Ambulatory Visit: Payer: Self-pay | Admitting: Oncology

## 2015-12-31 ENCOUNTER — Other Ambulatory Visit: Payer: Self-pay | Admitting: Family Medicine

## 2015-12-31 DIAGNOSIS — C50411 Malignant neoplasm of upper-outer quadrant of right female breast: Secondary | ICD-10-CM

## 2016-01-24 ENCOUNTER — Other Ambulatory Visit: Payer: Self-pay | Admitting: Nurse Practitioner

## 2016-01-24 DIAGNOSIS — I10 Essential (primary) hypertension: Secondary | ICD-10-CM | POA: Diagnosis not present

## 2016-01-24 DIAGNOSIS — E1165 Type 2 diabetes mellitus with hyperglycemia: Secondary | ICD-10-CM | POA: Diagnosis not present

## 2016-01-24 DIAGNOSIS — E890 Postprocedural hypothyroidism: Secondary | ICD-10-CM | POA: Diagnosis not present

## 2016-01-24 DIAGNOSIS — E7800 Pure hypercholesterolemia, unspecified: Secondary | ICD-10-CM | POA: Diagnosis not present

## 2016-01-25 ENCOUNTER — Other Ambulatory Visit: Payer: Self-pay | Admitting: Family Medicine

## 2016-01-29 ENCOUNTER — Other Ambulatory Visit: Payer: Self-pay | Admitting: Oncology

## 2016-01-29 DIAGNOSIS — C50411 Malignant neoplasm of upper-outer quadrant of right female breast: Secondary | ICD-10-CM

## 2016-02-13 ENCOUNTER — Other Ambulatory Visit: Payer: Self-pay | Admitting: Family Medicine

## 2016-02-14 DIAGNOSIS — E119 Type 2 diabetes mellitus without complications: Secondary | ICD-10-CM | POA: Diagnosis not present

## 2016-02-29 ENCOUNTER — Other Ambulatory Visit: Payer: Self-pay | Admitting: Family Medicine

## 2016-03-04 ENCOUNTER — Telehealth: Payer: Self-pay | Admitting: Family Medicine

## 2016-03-04 ENCOUNTER — Other Ambulatory Visit: Payer: Self-pay | Admitting: Oncology

## 2016-03-04 DIAGNOSIS — C50411 Malignant neoplasm of upper-outer quadrant of right female breast: Secondary | ICD-10-CM

## 2016-03-04 NOTE — Telephone Encounter (Signed)
Discussed with pt. Pt verbalized understanding. Transferred to front to schedule office visit

## 2016-03-04 NOTE — Telephone Encounter (Signed)
Patient called to request referral to Dr. Estanislado Pandy for rheumatology  States that her arthritis pain is worse and in new areas.  States orho just wants to do surgery & she feels like she may have RA  States that both her parents & her daughter have RA  NTBS & get lab work or refer?  Please advise

## 2016-03-04 NOTE — Telephone Encounter (Signed)
Dr d likely wont agree to taking over plain old arthritis, that's not their field of expertise, needs ov here and proper testing to decide what's up

## 2016-03-06 ENCOUNTER — Ambulatory Visit (HOSPITAL_BASED_OUTPATIENT_CLINIC_OR_DEPARTMENT_OTHER): Payer: Medicare Other | Admitting: Oncology

## 2016-03-06 ENCOUNTER — Ambulatory Visit (HOSPITAL_COMMUNITY)
Admission: RE | Admit: 2016-03-06 | Discharge: 2016-03-06 | Disposition: A | Discharge status: Home / Self Care | Payer: Medicare Other | Source: Ambulatory Visit | Attending: Oncology | Admitting: Oncology

## 2016-03-06 ENCOUNTER — Other Ambulatory Visit (HOSPITAL_BASED_OUTPATIENT_CLINIC_OR_DEPARTMENT_OTHER): Payer: Medicare Other

## 2016-03-06 VITALS — BP 153/72 | HR 101 | Temp 98.2°F | Resp 18 | Ht 65.0 in | Wt 291.3 lb

## 2016-03-06 DIAGNOSIS — C50411 Malignant neoplasm of upper-outer quadrant of right female breast: Secondary | ICD-10-CM

## 2016-03-06 DIAGNOSIS — M5136 Other intervertebral disc degeneration, lumbar region: Secondary | ICD-10-CM | POA: Insufficient documentation

## 2016-03-06 DIAGNOSIS — Z170 Estrogen receptor positive status [ER+]: Secondary | ICD-10-CM

## 2016-03-06 DIAGNOSIS — M5440 Lumbago with sciatica, unspecified side: Secondary | ICD-10-CM | POA: Diagnosis not present

## 2016-03-06 DIAGNOSIS — R52 Pain, unspecified: Secondary | ICD-10-CM

## 2016-03-06 DIAGNOSIS — C773 Secondary and unspecified malignant neoplasm of axilla and upper limb lymph nodes: Secondary | ICD-10-CM

## 2016-03-06 DIAGNOSIS — M545 Low back pain: Secondary | ICD-10-CM

## 2016-03-06 LAB — COMPREHENSIVE METABOLIC PANEL
ALBUMIN: 4 g/dL (ref 3.5–5.0)
ALK PHOS: 94 U/L (ref 40–150)
ALT: 13 U/L (ref 0–55)
AST: 20 U/L (ref 5–34)
Anion Gap: 9 mEq/L (ref 3–11)
BILIRUBIN TOTAL: 0.43 mg/dL (ref 0.20–1.20)
BUN: 17.3 mg/dL (ref 7.0–26.0)
CALCIUM: 9.6 mg/dL (ref 8.4–10.4)
CO2: 27 mEq/L (ref 22–29)
CREATININE: 1.3 mg/dL — AB (ref 0.6–1.1)
Chloride: 106 mEq/L (ref 98–109)
EGFR: 49 mL/min/{1.73_m2} — ABNORMAL LOW (ref >90)
Glucose: 153 mg/dl — ABNORMAL HIGH (ref 70–140)
POTASSIUM: 4.3 meq/L (ref 3.5–5.1)
Sodium: 143 mEq/L (ref 136–145)
Total Protein: 8 g/dL (ref 6.4–8.3)

## 2016-03-06 LAB — CBC WITH DIFFERENTIAL/PLATELET
BASO%: 0.3 % (ref 0.0–2.0)
BASOS ABS: 0 10*3/uL (ref 0.0–0.1)
EOS ABS: 0.2 10*3/uL (ref 0.0–0.5)
EOS%: 2.6 % (ref 0.0–7.0)
HEMATOCRIT: 36.5 % (ref 34.8–46.6)
HEMOGLOBIN: 12.2 g/dL (ref 11.6–15.9)
LYMPH#: 1.7 10*3/uL (ref 0.9–3.3)
LYMPH%: 28.2 % (ref 14.0–49.7)
MCH: 28.1 pg (ref 25.1–34.0)
MCHC: 33.4 g/dL (ref 31.5–36.0)
MCV: 84.1 fL (ref 79.5–101.0)
MONO#: 0.5 10*3/uL (ref 0.1–0.9)
MONO%: 8.6 % (ref 0.0–14.0)
NEUT#: 3.7 10*3/uL (ref 1.5–6.5)
NEUT%: 60.3 % (ref 38.4–76.8)
Platelets: 212 10*3/uL (ref 145–400)
RBC: 4.34 10*6/uL (ref 3.70–5.45)
RDW: 15.7 % — AB (ref 11.2–14.5)
WBC: 6.1 10*3/uL (ref 3.9–10.3)

## 2016-03-06 NOTE — Progress Notes (Signed)
[place]  [phone] [phone]) [phone]    ID: [name] OB: 1955/03/23  MR#: [id number]  JOI#:[id number]  PCP: Mickie Hillier, MD GYN:   SU: Dr. Erroll Luna OTHER MD: Dr. Thea Silversmith  CHIEF COMPLAINT:  Locally advanced breast cancer  TREATMENT: Anastrozole  BREAST CANCER HISTORY: From Dr Dana Allan original intake note:  The patient herself noted a change in her right breast November 2014, but did not tell her family until after Christmas at the year. They "pretty much forced me" to have a mammogram and bilateral diagnostic mammography and right ultrasound 01/19/2013 at the breast Center showed an irregular mass associated with pleomorphic calcifications in the upper-outer quadrant of the right breast measuring 1.6 cm. There was an abnormal appearing right axillary lymph node. On physical exam, there was a movable firm palpable mass in the right breast measuring approximately 2 cm by palpation. Ultrasound showed this to be an irregularly marginated hypoechoic mass measuring 1.8 cm. In the right axilla the ultrasound showed a 1.3 cm abnormal appearing level I lymph node (loss a fatty hilum).  The 01/26/2013 the patient underwent biopsy of both the right breast mass in question and a suspicious right axillary lymph node. 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   Her subsequent history is as detailed below.  INTERVAL HISTORY: Brooks today for follow-up of her estrogen receptor positive breast cancer. She continues on anastrozole but is complaining of multiple aches and pains, which are really "all over". In addition she has had  "frying pain "in her lower back. She was barely able to get out of bed. Even now she is walking with difficulty. She is not having problems with hot flashes or vaginal dryness. She obtains theanastrozole at a good price.  REVIEW OF SYSTEMS: Branna as multiple problems related to her bid obesity but these are stable. A detailed review of systems today was otherwise noncontributory  PAST MEDICAL HISTORY: Past Medical History:  Diagnosis Date  . Asthma    prn neb. and inhaler  . Breast cancer ([place]) 01/2013   right  . Dehydration 04/13/2013  . GERD (gastroesophageal reflux disease)   . Graves' disease   . Hyperlipidemia   . Hypertension    on multiple meds., has been on med. > 14 yr.  . Non-insulin dependent type 2 diabetes mellitus ([place])   . Obesity   . Ophthalmic manifestation of Graves disease   . Osteoarthritis 2003   left knee  . Radiation 10/11/13-11/29/13   Right Breast  . Sleep apnea 4/16   mod  . Wears glasses     PAST SURGICAL HISTORY: Past Surgical History:  Procedure Laterality Date  . CARDIOVERSION N/A 11/08/2013   Procedure: CARDIOVERSION;  Surgeon: Lelon Perla, MD;  Location: [facility] ENDOSCOPY;  Service: Cardiovascular;  Laterality: N/A;  . COLONOSCOPY  2007  . COLONOSCOPY W/ POLYPECTOMY  2009  . EXCISION OF KELOID N/A 05/18/2014   Procedure: EXCISION OF KELOID;  Surgeon: Erroll Luna, MD;  Location: [place];  Service: General;  Laterality: N/A;  . PORT-A-CATH REMOVAL Right 05/18/2014   Procedure: REMOVAL PORT-A-CATH;  Surgeon: Erroll Luna, MD;  Location: [place];  Service: General;  Laterality: Right;  . PORTACATH PLACEMENT Right 02/17/2013   Procedure: INSERTION PORT-A-CATH;  Surgeon: Joyice Faster. Cornett, MD;  Location: [place];  Service: General;  Laterality: Right;  . TEE WITHOUT CARDIOVERSION N/A 11/08/2013   Procedure: TRANSESOPHAGEAL ECHOCARDIOGRAM (TEE);  Surgeon: Lelon Perla, MD;  Location: [place];  Service: Cardiovascular;  Laterality: N/A;  . TOTAL KNEE ARTHROPLASTY Left 2003  . TOTAL THYROIDECTOMY    . TUBAL LIGATION  1985    FAMILY HISTORY Family History  Problem Relation Age of Onset  . Heart disease Father   . Lung cancer Father   . Diabetes Mother   . Diabetes Sister   . Hypertension Sister   . Asthma Sister    the patient's father died from lung cancer at the age of 28. The patient's mother died from thyroid cancer at the age of 58. The patient had 2 brothers, 2 sisters. There is no other history of breast or ovarian cancer in the family to her knowledge  GYN HISTORY: Menarche age 62, first live birth age 34. The patient is GX P2. She went through the change of life approximately age 61. She did not take hormone replacement.  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 She is now disabled secondary to her knee problems. She is divorced. At home she lives with her 2 daughters, [name] who works in direct supports to mentally retarded children, and [name], who is a group Games developer for mentally retarded children. The patient has no grandchildren. She attends a [employer]  ADVANCED DIRECTIVES: Not in place; these have been discussed with the patient, most recently in the 07/28/2013 visit.  HEALTH MAINTENANCE: Social History  Substance Use Topics  . Smoking status: Former Smoker    Packs/day: 0.25    Years: 20.00    Quit date: 02/10/1991  . Smokeless tobacco: Never Used  . Alcohol use No      Allergies  Allergen Reactions  . Procardia [Nifedipine] Other (See Comments)    MIGRAINES  . Aspirin     Unable to take due to blood pressure medication  . Diltiazem Itching    Current Outpatient Prescriptions  Medication Sig Dispense Refill  . ACCU-CHEK SMARTVIEW test strip USE ONE STRIP TO CHECK GLUCOSE 4 TIMES DAILY AS DIRECTED 100 each 5  . acetaminophen (TYLENOL) 325 MG tablet Take  2 tablets (650 mg total) by mouth every 6 (six) hours as needed for mild pain or headache.    . albuterol (PROVENTIL HFA;VENTOLIN HFA) 108 (90 BASE) MCG/ACT inhaler Inhale 2 puffs into the lungs every 4 (four) hours as needed. 18 g 2  . albuterol (PROVENTIL) (2.5 MG/3ML) 0.083% nebulizer solution Take 3 mLs (2.5 mg total) by nebulization every 6 (six) hours as needed for wheezing or shortness of breath. 150 mL 0  . amLODipine (NORVASC) 10 MG tablet TAKE ONE TABLET BY MOUTH ONCE DAILY. 90 tablet 1  . anastrozole (ARIMIDEX) 1 MG tablet TAKE ONE TABLET BY MOUTH ONCE DAILY. 90 tablet 0  . Artificial Tear Ointment (ARTIFICIAL TEARS) ointment Place 1 drop into both eyes as needed (for grey eye disease).     . benazepril (LOTENSIN) 40 MG tablet TAKE ONE TABLET BY MOUTH ONCE DAILY. 30 tablet 5  . Biotin 1000 MCG tablet Take 1,000 mcg by mouth daily.    . calcium carbonate (TUMS EX) 750 MG chewable tablet Chew 2 tablets by mouth 4 (four) times  daily.    . carvedilol (COREG) 12.5 MG tablet TAKE (1) TABLET BY MOUTH TWICE DAILY. 180 tablet 0  . cholecalciferol (VITAMIN D) 1000 UNITS tablet Take 1,000 Units by mouth daily.    Marland Kitchen ELIQUIS 5 MG TABS tablet TAKE (1) TABLET BY MOUTH TWICE DAILY. 60 tablet 3  . escitalopram (LEXAPRO) 10 MG tablet Take 1 tablet (10 mg total) by mouth daily. 30 tablet 5  . gabapentin (NEURONTIN) 100 MG capsule TAKE 1 CAPSULE BY MOUTH THREE TIMES A DAY. 90 capsule 0  . glucose blood (ACCU-CHEK SMARTVIEW) test strip USE ONE TEST STRIP TO CHECK BLOOD SUGAR 4 TIMES DAILY 100 each 5  . HUMALOG MIX 75/25 KWIKPEN (75-25) 100 UNIT/ML Kwikpen   11  . Insulin Pen Needle (UNIFINE PENTIPS) 31G X 8 MM MISC Use to inject insulin one time daily    . levothyroxine (SYNTHROID, LEVOTHROID) 88 MCG tablet TAKE ONE TABLET BY MOUTH ONCE DAILY. 30 tablet 5  . loratadine (CLARITIN) 10 MG tablet Take 1 tablet (10 mg total) by mouth daily. 30 tablet 5  . pravastatin (PRAVACHOL) 80 MG tablet TAKE (1) TABLET BY  MOUTH AT BEDTIME. 30 tablet 5  . traMADol (ULTRAM) 50 MG tablet Take 1 tablet by mouth daily as needed for pain 30 tablet 5   Current Facility-Administered Medications  Medication Dose Route Frequency Provider Last Rate Last Dose  . methylPREDNISolone acetate (DEPO-MEDROL) injection 40 mg  40 mg Intra-articular Once Mikey Kirschner, MD        OBJECTIVE: morbidly obeseAfrican American woman    Vitals:   03/06/16 1307  BP: (!) 153/72  Pulse: (!) 101  Resp: 18  Temp: 98.2 F (36.8 C)     Body mass index is 48.47 kg/m.     ECOG: 2  Sclerae unicteric, pupils round and equal Oropharynx clear and moist-- no thrush or other lesions No cervical or supraclavicular adenopathy Lungs no rales or rhonchi Heart regular rate and rhythm Abd soft, Obese,nontender, positive bowel sounds MSK mild lumbarspinal tenderness, no upper extremity lymphedema Neuro: nonfocal, well oriented, appropriate affect Breasts: the right breast is status post lumpectomy followed by radiation with no evidence of local recurrence. The right axilla is benign. Left breast is unremarkable.   LAB RESULTS:  CMP     Component Value Date/Time   NA 143 03/06/2016 1252   K 4.3 03/06/2016 1252   CL 105 06/02/2015 0948   CO2 27 03/06/2016 1252   GLUCOSE 153 (H) 03/06/2016 1252   BUN 17.3 03/06/2016 1252   CREATININE 1.3 (H) 03/06/2016 1252   CALCIUM 9.6 03/06/2016 1252   PROT 8.0 03/06/2016 1252   ALBUMIN 4.0 03/06/2016 1252   AST 20 03/06/2016 1252   ALT 13 03/06/2016 1252   ALKPHOS 94 03/06/2016 1252   BILITOT 0.43 03/06/2016 1252   GFRNONAA 64 06/02/2015 0948   GFRAA 74 06/02/2015 0948    I No results found for: SPEP  Lab Results  Component Value Date   WBC 6.1 03/06/2016   NEUTROABS 3.7 03/06/2016   HGB 12.2 03/06/2016   HCT 36.5 03/06/2016   MCV 84.1 03/06/2016   PLT 212 03/06/2016      Chemistry      Component Value Date/Time   NA 143 03/06/2016 1252   K 4.3 03/06/2016 1252   CL 105  06/02/2015 0948   CO2 27 03/06/2016 1252   BUN 17.3 03/06/2016 1252   CREATININE 1.3 (H) 03/06/2016 1252      Component Value  Date/Time   CALCIUM 9.6 03/06/2016 1252   ALKPHOS 94 03/06/2016 1252   AST 20 03/06/2016 1252   ALT 13 03/06/2016 1252   BILITOT 0.43 03/06/2016 1252       No results found for: LABCA2  No components found for: LABCA125  No results for input(s): INR in the last 168 hours.  Urinalysis    Component Value Date/Time   COLORURINE YELLOW 04/07/2015 2350   APPEARANCEUR CLOUDY (A) 04/07/2015 2350   LABSPEC 1.020 04/07/2015 2350   PHURINE 5.0 04/07/2015 2350   GLUCOSEU >1000 (A) 04/07/2015 2350   HGBUR NEGATIVE 04/07/2015 2350   BILIRUBINUR NEGATIVE 04/07/2015 2350   KETONESUR NEGATIVE 04/07/2015 2350   PROTEINUR 30 (A) 04/07/2015 2350   UROBILINOGEN 0.2 09/06/2013 1040   NITRITE NEGATIVE 04/07/2015 2350   LEUKOCYTESUR NEGATIVE 04/07/2015 2350    STUDIES: Dg Lumbar Spine 2-3 Views  Result Date: 03/06/2016 CLINICAL DATA:  61 year old female with a history of lumbar back pain for 2 months. No known injury. EXAM: LUMBAR SPINE - 2-3 VIEW COMPARISON:  CT abdomen 02/25/2013 FINDINGS: Lumbar Spine: Lumbar vertebral elements maintain normal alignment without evidence of subluxation. . No fracture line identified.  Vertebral body heights maintained. Disc space narrowing throughout the lower thoracic and lumbar spine with endplate sclerosis. Greatest degree of disc space narrowing in the lumbar spine at L5-S1. Facet changes are most pronounced at L4-L5 and L5-S1. IMPRESSION: Negative for acute fracture or malalignment of the lumbar spine. Developing disc disease throughout the lower thoracic and lumbar spine, with the greatest degree of narrowing in the lumbar spine at the L5-S1 level. Electronically Signed   By: Corrie Mckusick D.O.   On: 03/06/2016 16:07    ASSESSMENT: 61 y.o. [place] woman   (1) status post right breast and right axillary lymph node biopsy  01/26/2013,  for a clinical T1c N1, stage IIA invasive ductal carcinoma, grade 3, estrogen and progesterone receptor positive, HER-2 amplified by immunohistochemistry (3+), with an MIB-1 of 70%.  (2) treated neo-adjuvantly with 4 of 6 planned cycles of docetaxel, carboplatin, trastuzumab,and pertuzumab, last dose 04/27/2013   (a) final 2 cycles of chemotherapy omitted in the face of multiple renal and electrolyte abnormalities    (b) pertuzumab and trastuzumab continued until 04/27/2013  (c) trastuzumab alone continued to complete one year of anti-HER-2 treatment, last dose 04/12/2014  (d) echocardiogram 03/31/14 showing an ejection fraction in the 60-65% range  (3) status post right lumpectomy and sentinel lymph node sampling 07/19/2013 showing a complete pathologic response (ypT0 ypN0)  (4) adjuvant radiation to the breast completed on 11/29/13   (a) participating in RTOG 1304/ B-51, randomized to no regional nodal XRT  (5) began anastrozole daily on 12/22/13  (a) DEXA scan 01/17/2014 showed a T score of -0.3 (normal).  PLAN: Yazlynn is now two and a half years out from definitive surgery for her breast cancer with no evidence of disease recurrence. This is very favorable.  She is tolerating I believe anastrozole moderately well and I do not believe the back pain is related. On the other hand she does have aches and pains "everywhere" which could be related to the anastrozole.  The only way to find out is to stop the anastrozole and reassess. We are doing that now. She will see me in 3 months. If she still has the same symptoms and we will simply resume the anastrozole if not we will likely switch her to exemestane.  We did obtain plain films of her lower back  today which show significant degenerative disc disease. We will offer her referral to neurosurgery and physical therapy.  She knows to call for any problems that may develop before her next visit here    Chauncey Cruel, MD

## 2016-03-11 ENCOUNTER — Telehealth: Payer: Self-pay | Admitting: *Deleted

## 2016-03-11 DIAGNOSIS — Z170 Estrogen receptor positive status [ER+]: Secondary | ICD-10-CM

## 2016-03-11 DIAGNOSIS — C50411 Malignant neoplasm of upper-outer quadrant of right female breast: Secondary | ICD-10-CM

## 2016-03-11 NOTE — Telephone Encounter (Signed)
Per MD, notified pt of scan results, instructed to start Vitamin D3 1000 units daily. Pt requested the neurosurgery and physical therapy referral be sent to [facility] [place]. Referrals entered.

## 2016-03-13 ENCOUNTER — Ambulatory Visit: Payer: Medicare Other | Admitting: Family Medicine

## 2016-03-21 ENCOUNTER — Ambulatory Visit (HOSPITAL_COMMUNITY): Payer: Medicare Other

## 2016-03-28 ENCOUNTER — Other Ambulatory Visit: Payer: Self-pay | Admitting: Cardiology

## 2016-04-07 ENCOUNTER — Other Ambulatory Visit: Payer: Self-pay | Admitting: Oncology

## 2016-04-07 DIAGNOSIS — C50411 Malignant neoplasm of upper-outer quadrant of right female breast: Secondary | ICD-10-CM

## 2016-04-24 ENCOUNTER — Other Ambulatory Visit: Payer: Self-pay | Admitting: Adult Health

## 2016-04-24 ENCOUNTER — Other Ambulatory Visit: Payer: Self-pay | Admitting: Family Medicine

## 2016-04-24 DIAGNOSIS — N6320 Unspecified lump in the left breast, unspecified quadrant: Secondary | ICD-10-CM

## 2016-04-24 DIAGNOSIS — N6310 Unspecified lump in the right breast, unspecified quadrant: Secondary | ICD-10-CM

## 2016-04-24 DIAGNOSIS — Z853 Personal history of malignant neoplasm of breast: Secondary | ICD-10-CM

## 2016-05-02 ENCOUNTER — Encounter (HOSPITAL_COMMUNITY): Payer: Medicare Other

## 2016-05-06 ENCOUNTER — Encounter (HOSPITAL_COMMUNITY): Payer: Medicare Other

## 2016-05-07 ENCOUNTER — Other Ambulatory Visit: Payer: Self-pay | Admitting: Oncology

## 2016-05-07 DIAGNOSIS — C50411 Malignant neoplasm of upper-outer quadrant of right female breast: Secondary | ICD-10-CM

## 2016-05-21 ENCOUNTER — Ambulatory Visit (INDEPENDENT_AMBULATORY_CARE_PROVIDER_SITE_OTHER): Payer: Medicare Other | Admitting: Family Medicine

## 2016-05-21 ENCOUNTER — Encounter: Payer: Self-pay | Admitting: Family Medicine

## 2016-05-21 VITALS — BP 128/82 | Ht 65.0 in | Wt 297.2 lb

## 2016-05-21 DIAGNOSIS — M1711 Unilateral primary osteoarthritis, right knee: Secondary | ICD-10-CM

## 2016-05-21 DIAGNOSIS — I10 Essential (primary) hypertension: Secondary | ICD-10-CM | POA: Diagnosis not present

## 2016-05-21 DIAGNOSIS — E039 Hypothyroidism, unspecified: Secondary | ICD-10-CM | POA: Diagnosis not present

## 2016-05-21 DIAGNOSIS — E785 Hyperlipidemia, unspecified: Secondary | ICD-10-CM

## 2016-05-21 MED ORDER — LEVOTHYROXINE SODIUM 88 MCG PO TABS: 88.0000 ug | ORAL_TABLET | Freq: Every day | ORAL | 5 refills | Status: DC

## 2016-05-21 MED ORDER — LORATADINE 10 MG PO TABS: 10.0000 mg | ORAL_TABLET | Freq: Every day | ORAL | 5 refills | Status: DC

## 2016-05-21 MED ORDER — AMLODIPINE BESYLATE 10 MG PO TABS: 10.0000 mg | ORAL_TABLET | Freq: Every day | ORAL | 1 refills | Status: DC

## 2016-05-21 MED ORDER — PRAVASTATIN SODIUM 80 MG PO TABS: ORAL_TABLET | ORAL | 5 refills | Status: DC

## 2016-05-21 MED ORDER — BENAZEPRIL HCL 40 MG PO TABS: 40.0000 mg | ORAL_TABLET | Freq: Every day | ORAL | 5 refills | Status: DC

## 2016-05-21 MED ORDER — TRAMADOL HCL 50 MG PO TABS: ORAL_TABLET | ORAL | 5 refills | Status: DC

## 2016-05-21 MED ORDER — CARVEDILOL 12.5 MG PO TABS: ORAL_TABLET | ORAL | 1 refills | Status: DC

## 2016-05-21 NOTE — Progress Notes (Signed)
   Subjective:    Patient ID: [name], female    DOB: Jul 13, 1955, 61 y.o.   MRN: [id number]  Hypertension  This is a chronic problem. The current episode started more than 1 year ago.   Blood pressure medicine and blood pressure levels reviewed today with patient. Compliant with blood pressure medicine. States does not miss a dose. No obvious side effects. Blood pressure generally good when checked elsewhere. Watching salt intake.  Patient continues to take lipid medication regularly. No obvious side effects from it. Generally does not miss a dose. Prior blood work results are reviewed with patient. Patient continues to work on fat intake in diet  Patient claims compliance with her thyroid medication. States she has not missed a dose. No excessive fatigue. Has not done blood work recently.  Patient has concerns of knee pain.taking one knee saw an ortho once about the knee, they wanted to do a knee replament, right knee is bone on bone, not wanting to do a total kne rplacement  Now has substantially worsening knee pain. Would like to see a new orthopedist different from Dr. Alvan Dame    Review of Systems No headache, no major weight loss or weight gain, no chest pain no back pain abdominal pain no change in bowel habits complete ROS otherwise negative     Objective:   Physical Exam  Alert and oriented, vitals reviewed and stable, NAD ENT-TM's and ext canals WNL bilat via otoscopic exam Soft palate, tonsils and post pharynx WNL via oropharyngeal exam Neck-symmetric, no masses; thyroid nonpalpable and nontender Pulmonary-no tachypnea or accessory muscle use; Clear without wheezes via auscultation Card--no abnrml murmurs, rhythm reg and rate WNL Carotid pulses symmetric, without bruits Nature right knee crepitations with pain with extension and effusion      Assessment & Plan:  Impression 1 hypertension good control discussed maintain same medicine #2 hyperlipidemia status  uncertain discuss #3 hypothyroidism. Compliance discussed. Status uncertain #4 worsening arthritis with need to see her orthopedist discussed

## 2016-05-22 LAB — HEPATIC FUNCTION PANEL
ALT: 14 IU/L (ref 0–32)
AST: 21 IU/L (ref 0–40)
Albumin: 4.6 g/dL (ref 3.6–4.8)
Alkaline Phosphatase: 94 IU/L (ref 39–117)
Bilirubin Total: 0.4 mg/dL (ref 0.0–1.2)
Bilirubin, Direct: 0.11 mg/dL (ref 0.00–0.40)
TOTAL PROTEIN: 7.7 g/dL (ref 6.0–8.5)

## 2016-05-22 LAB — BASIC METABOLIC PANEL
BUN/Creatinine Ratio: 14 (ref 12–28)
BUN: 14 mg/dL (ref 8–27)
CALCIUM: 9.3 mg/dL (ref 8.7–10.3)
CO2: 25 mmol/L (ref 18–29)
CREATININE: 0.98 mg/dL (ref 0.57–1.00)
Chloride: 101 mmol/L (ref 96–106)
GFR calc Af Amer: 72 mL/min/{1.73_m2} (ref >59)
GFR, EST NON AFRICAN AMERICAN: 62 mL/min/{1.73_m2} (ref >59)
Glucose: 148 mg/dL — ABNORMAL HIGH (ref 65–99)
POTASSIUM: 4 mmol/L (ref 3.5–5.2)
Sodium: 142 mmol/L (ref 134–144)

## 2016-05-22 LAB — LIPID PANEL
CHOL/HDL RATIO: 2.1 ratio (ref 0.0–4.4)
Cholesterol, Total: 162 mg/dL (ref 100–199)
HDL: 77 mg/dL (ref >39)
LDL CALC: 72 mg/dL (ref 0–99)
Triglycerides: 67 mg/dL (ref 0–149)
VLDL CHOLESTEROL CAL: 13 mg/dL (ref 5–40)

## 2016-05-22 LAB — TSH: TSH: 2.06 u[IU]/mL (ref 0.450–4.500)

## 2016-05-25 ENCOUNTER — Encounter: Payer: Self-pay | Admitting: Family Medicine

## 2016-05-28 ENCOUNTER — Encounter: Payer: Self-pay | Admitting: Family Medicine

## 2016-06-03 ENCOUNTER — Ambulatory Visit (HOSPITAL_COMMUNITY)
Admission: RE | Admit: 2016-06-03 | Discharge: 2016-06-03 | Disposition: A | Discharge status: Home / Self Care | Payer: Medicare Other | Source: Ambulatory Visit | Attending: Adult Health | Admitting: Adult Health

## 2016-06-03 ENCOUNTER — Ambulatory Visit (HOSPITAL_BASED_OUTPATIENT_CLINIC_OR_DEPARTMENT_OTHER): Payer: Medicare Other | Admitting: Oncology

## 2016-06-03 ENCOUNTER — Encounter (HOSPITAL_COMMUNITY): Payer: Self-pay

## 2016-06-03 VITALS — BP 161/66 | HR 97 | Temp 98.6°F | Resp 20 | Ht 65.0 in | Wt 299.6 lb

## 2016-06-03 DIAGNOSIS — C50411 Malignant neoplasm of upper-outer quadrant of right female breast: Secondary | ICD-10-CM

## 2016-06-03 DIAGNOSIS — C773 Secondary and unspecified malignant neoplasm of axilla and upper limb lymph nodes: Secondary | ICD-10-CM

## 2016-06-03 DIAGNOSIS — Z170 Estrogen receptor positive status [ER+]: Secondary | ICD-10-CM | POA: Diagnosis not present

## 2016-06-03 DIAGNOSIS — R928 Other abnormal and inconclusive findings on diagnostic imaging of breast: Secondary | ICD-10-CM | POA: Diagnosis not present

## 2016-06-03 DIAGNOSIS — Z853 Personal history of malignant neoplasm of breast: Secondary | ICD-10-CM

## 2016-06-03 NOTE — Progress Notes (Signed)
[facility]  [phone] [phone]    ID: [name] OB: [birth year]  MR#: [id number]  GOT#:[id number]  PCP: Mikey Kirschner, MD GYN:   SU: Dr. Erroll Luna OTHER MD: Dr. Thea Silversmith  CHIEF COMPLAINT:  Locally advanced breast cancer  TREATMENT: observation  BREAST CANCER HISTORY: From Dr Dana Allan original intake note:  The patient herself noted a change in her right breast November 2014, but did not tell her family until after Christmas at the year. They "pretty much forced me" to have a mammogram and bilateral diagnostic mammography and right ultrasound 01/19/2013 at the breast Center showed an irregular mass associated with pleomorphic calcifications in the upper-outer quadrant of the right breast measuring 1.6 cm. There was an abnormal appearing right axillary lymph node. On physical exam, there was a movable firm palpable mass in the right breast measuring approximately 2 cm by palpation. Ultrasound showed this to be an irregularly marginated hypoechoic mass measuring 1.8 cm. In the right axilla the ultrasound showed a 1.3 cm abnormal appearing level I lymph node (loss a fatty hilum).  The 01/26/2013 the patient underwent biopsy of both the right breast mass in question and a suspicious right axillary lymph node. 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   Her subsequent history is as detailed below.  INTERVAL HISTORY: [name] returns today for follow-up of her estrogen receptor positive breast cancer. She had been on anastrozole but was having significant problems with arthralgias and myalgias and went off the medication  February 2018.  She tells me she feels much better off the medication. She does have some arthritis here and there but it is noticeably better she says. She is as a result more active. Hot flashes and vaginal dryness were not significant problems. They still aren't.  At this point she really is not interested in changing medications.  REVIEW OF SYSTEMS: She spends a lot of time doing crocheting, housework, and doing some church activities. She tells me her family is doing fine. She is on apixaban, with no bleeding complications. A detailed review of systems today was stable  PAST MEDICAL HISTORY: Past Medical History:  Diagnosis Date  . Asthma    prn neb. and inhaler  . Breast cancer ([facility]) 01/2013   right  . Dehydration 04/13/2013  . GERD (gastroesophageal reflux disease)   . Graves' disease   . Hyperlipidemia   . Hypertension    on multiple meds., has been on med. > 14 yr.  . Non-insulin dependent type 2 diabetes mellitus ([facility])   . Obesity   . Ophthalmic manifestation of Graves disease   . Osteoarthritis 2003   left knee  . Radiation 10/11/13-11/29/13   Right Breast  . Sleep apnea 4/16   mod  . Wears glasses     PAST SURGICAL HISTORY: Past Surgical History:  Procedure Laterality Date  . CARDIOVERSION N/A 11/08/2013   Procedure: CARDIOVERSION;  Surgeon: Lelon Perla, MD;  Location: [facility] ENDOSCOPY;  Service: Cardiovascular;  Laterality: N/A;  . COLONOSCOPY  2007  . COLONOSCOPY W/ POLYPECTOMY  2009  . EXCISION OF KELOID N/A 05/18/2014   Procedure: EXCISION OF KELOID;  Surgeon: Erroll Luna, MD;  Location: [facility];  Service: General;  Laterality: N/A;  . PORT-A-CATH REMOVAL Right 05/18/2014   Procedure: REMOVAL PORT-A-CATH;  Surgeon: Erroll Luna, MD;  Location: [facility];  Service: General;  Laterality: Right;  . PORTACATH PLACEMENT Right 02/17/2013   Procedure: INSERTION PORT-A-CATH;  Surgeon: Joyice Faster. Cornett, MD;  Location: [facility];  Service: General;  Laterality: Right;  . TEE WITHOUT CARDIOVERSION N/A 11/08/2013   Procedure: TRANSESOPHAGEAL ECHOCARDIOGRAM (TEE);  Surgeon: Lelon Perla, MD;  Location: [facility];  Service: Cardiovascular;  Laterality: N/A;  . TOTAL KNEE ARTHROPLASTY Left 2003  . TOTAL THYROIDECTOMY    . TUBAL LIGATION  1985    FAMILY HISTORY Family History  Problem Relation Age of Onset  . Heart disease Father   . Lung cancer Father   . Diabetes Mother   . Diabetes Sister   . Hypertension Sister   . Asthma Sister    the patient's father died from lung cancer at the age of 60. The patient's mother died from thyroid cancer at the age of 3. The patient had 2 brothers, 2 sisters. There is no other history of breast or ovarian cancer in the family to her knowledge  GYN HISTORY: Menarche age 21, first live birth age 18. The patient is GX P2. She went through the change of life approximately age 61. She did not take hormone replacement.  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 She is now disabled secondary to her knee problems. She is divorced. At home she lives with her 2 daughters, [name] who works in direct supports to mentally retarded children, and [name], who is a group Games developer for mentally retarded children. The patient has no grandchildren. She attends a [employer]  ADVANCED DIRECTIVES: Not in place; these have been discussed with the patient, most recently in the 07/28/2013 visit.  HEALTH MAINTENANCE: Social History  Substance Use Topics  . Smoking status: Former Smoker    Packs/day: 0.25    Years: 20.00    Quit date: 02/10/1991  . Smokeless tobacco: Never Used  . Alcohol use No      Allergies  Allergen Reactions  . Procardia [Nifedipine] Other (See Comments)    MIGRAINES  . Aspirin     Unable to take due to blood pressure medication  . Diltiazem Itching    Current Outpatient  Prescriptions  Medication Sig Dispense Refill  . ACCU-CHEK SMARTVIEW test strip USE ONE STRIP TO CHECK GLUCOSE 4 TIMES DAILY AS DIRECTED 100 each 5  . acetaminophen (TYLENOL) 325 MG tablet Take 2 tablets (650 mg total) by mouth every 6 (six) hours as needed for mild pain or headache.    . albuterol (PROVENTIL HFA;VENTOLIN HFA) 108 (90 BASE) MCG/ACT inhaler Inhale 2 puffs into the lungs every 4 (four) hours as needed. 18 g 2  . albuterol (PROVENTIL) (2.5 MG/3ML) 0.083% nebulizer solution Take 3 mLs (2.5 mg total) by nebulization every 6 (six) hours as needed for wheezing or shortness of breath. 150 mL 0  . amLODipine (NORVASC) 10 MG tablet Take 1 tablet (10 mg total) by mouth daily. 90 tablet 1  . Artificial Tear Ointment (ARTIFICIAL TEARS) ointment Place 1 drop into both eyes as needed (for grey eye disease).     . benazepril (LOTENSIN) 40 MG tablet Take 1 tablet (40 mg total) by mouth daily. 30 tablet 5  . Biotin 1000 MCG tablet Take 1,000 mcg by mouth  daily.    . calcium carbonate (TUMS EX) 750 MG chewable tablet Chew 2 tablets by mouth 4 (four) times daily.    . carvedilol (COREG) 12.5 MG tablet TAKE (1) TABLET BY MOUTH TWICE DAILY. 180 tablet 1  . cholecalciferol (VITAMIN D) 1000 UNITS tablet Take 1,000 Units by mouth daily.    Marland Kitchen ELIQUIS 5 MG TABS tablet TAKE (1) TABLET BY MOUTH TWICE DAILY. 60 tablet 6  . gabapentin (NEURONTIN) 100 MG capsule TAKE 1 CAPSULE BY MOUTH THREE TIMES A DAY. 90 capsule 0  . glucose blood (ACCU-CHEK SMARTVIEW) test strip USE ONE TEST STRIP TO CHECK BLOOD SUGAR 4 TIMES DAILY 100 each 5  . HUMALOG MIX 75/25 KWIKPEN (75-25) 100 UNIT/ML Kwikpen   11  . Insulin Pen Needle (UNIFINE PENTIPS) 31G X 8 MM MISC Use to inject insulin one time daily    . levothyroxine (SYNTHROID, LEVOTHROID) 88 MCG tablet Take 1 tablet (88 mcg total) by mouth daily. 30 tablet 5  . loratadine (CLARITIN) 10 MG tablet Take 1 tablet (10 mg total) by mouth daily. 30 tablet 5  . pravastatin  (PRAVACHOL) 80 MG tablet TAKE (1) TABLET BY MOUTH AT BEDTIME. 30 tablet 5  . traMADol (ULTRAM) 50 MG tablet Take 1 tablet by mouth daily as needed for pain 30 tablet 5   Current Facility-Administered Medications  Medication Dose Route Frequency Provider Last Rate Last Dose  . methylPREDNISolone acetate (DEPO-MEDROL) injection 40 mg  40 mg Intra-articular Once Mikey Kirschner, MD        OBJECTIVE: morbidly obese African American woman who appears stated age   61:   06/03/16 1502  BP: (!) 161/66  Pulse: 97  Resp: 20  Temp: 98.6 F (37 C)     Body mass index is 49.86 kg/m.     ECOG: 2  Sclerae unicteric, EOMs intact Oropharynx clear and moist No cervical or supraclavicular adenopathy Lungs no rales or rhonchi Heart regular rate and rhythm Abd soft, nontender, positive bowel sounds MSK no focal spinal tenderness, no upper extremity lymphedema Neuro: nonfocal, well oriented, appropriate affect Breasts: The right breast has undergone lumpectomy and radiation, with no evidence of local recurrence. Left breast is benign. Both axillae are benign.  LAB RESULTS:  CMP     Component Value Date/Time   NA 142 05/21/2016 1104   NA 143 03/06/2016 1252   K 4.0 05/21/2016 1104   K 4.3 03/06/2016 1252   CL 101 05/21/2016 1104   CO2 25 05/21/2016 1104   CO2 27 03/06/2016 1252   GLUCOSE 148 (H) 05/21/2016 1104   GLUCOSE 153 (H) 03/06/2016 1252   BUN 14 05/21/2016 1104   BUN 17.3 03/06/2016 1252   CREATININE 0.98 05/21/2016 1104   CREATININE 1.3 (H) 03/06/2016 1252   CALCIUM 9.3 05/21/2016 1104   CALCIUM 9.6 03/06/2016 1252   PROT 7.7 05/21/2016 1104   PROT 8.0 03/06/2016 1252   ALBUMIN 4.6 05/21/2016 1104   ALBUMIN 4.0 03/06/2016 1252   AST 21 05/21/2016 1104   AST 20 03/06/2016 1252   ALT 14 05/21/2016 1104   ALT 13 03/06/2016 1252   ALKPHOS 94 05/21/2016 1104   ALKPHOS 94 03/06/2016 1252   BILITOT 0.4 05/21/2016 1104   BILITOT 0.43 03/06/2016 1252   GFRNONAA 62  05/21/2016 1104   GFRAA 72 05/21/2016 1104    I No results found for: SPEP  Lab Results  Component Value Date   WBC 6.1 03/06/2016   NEUTROABS 3.7 03/06/2016  HGB 12.2 03/06/2016   HCT 36.5 03/06/2016   MCV 84.1 03/06/2016   PLT 212 03/06/2016      Chemistry      Component Value Date/Time   NA 142 05/21/2016 1104   NA 143 03/06/2016 1252   K 4.0 05/21/2016 1104   K 4.3 03/06/2016 1252   CL 101 05/21/2016 1104   CO2 25 05/21/2016 1104   CO2 27 03/06/2016 1252   BUN 14 05/21/2016 1104   BUN 17.3 03/06/2016 1252   CREATININE 0.98 05/21/2016 1104   CREATININE 1.3 (H) 03/06/2016 1252      Component Value Date/Time   CALCIUM 9.3 05/21/2016 1104   CALCIUM 9.6 03/06/2016 1252   ALKPHOS 94 05/21/2016 1104   ALKPHOS 94 03/06/2016 1252   AST 21 05/21/2016 1104   AST 20 03/06/2016 1252   ALT 14 05/21/2016 1104   ALT 13 03/06/2016 1252   BILITOT 0.4 05/21/2016 1104   BILITOT 0.43 03/06/2016 1252       No results found for: LABCA2  No components found for: LABCA125  No results for input(s): INR in the last 168 hours.  Urinalysis    Component Value Date/Time   COLORURINE YELLOW 04/07/2015 2350   APPEARANCEUR CLOUDY (A) 04/07/2015 2350   LABSPEC 1.020 04/07/2015 2350   PHURINE 5.0 04/07/2015 2350   GLUCOSEU >1000 (A) 04/07/2015 2350   HGBUR NEGATIVE 04/07/2015 2350   BILIRUBINUR NEGATIVE 04/07/2015 2350   KETONESUR NEGATIVE 04/07/2015 2350   PROTEINUR 30 (A) 04/07/2015 2350   UROBILINOGEN 0.2 09/06/2013 1040   NITRITE NEGATIVE 04/07/2015 2350   LEUKOCYTESUR NEGATIVE 04/07/2015 2350    STUDIES: Mm Diag Breast Tomo Bilateral  Result Date: 06/03/2016 CLINICAL DATA:  61 year old female with history of right breast lumpectomy in 2015 presenting for routine annual evaluation. EXAM: 2D DIGITAL DIAGNOSTIC BILATERAL MAMMOGRAM WITH CAD AND ADJUNCT TOMO COMPARISON:  Previous exam(s). ACR Breast Density Category b: There are scattered areas of fibroglandular density.  FINDINGS: The lumpectomy site in the upper-outer quadrant of the right breast is stable. No suspicious calcifications, masses or areas of distortion are seen in the bilateral breasts. Mammographic images were processed with CAD. IMPRESSION: Stable right breast lumpectomy site. No mammographic evidence of malignancy in the bilateral breasts. RECOMMENDATION: Diagnostic mammogram is suggested in 1 year. (Code:DM-B-01Y) I have discussed the findings and recommendations with the patient. Results were also provided in writing at the conclusion of the visit. If applicable, a reminder letter will be sent to the patient regarding the next appointment. BI-RADS CATEGORY  2: Benign. Electronically Signed   By: Ammie Ferrier M.D.   On: 06/03/2016 10:56    ASSESSMENT: 61 y.o. [place] woman   (1) status post right breast and right axillary lymph node biopsy 01/26/2013,  for a clinical T1c N1, stage IIA invasive ductal carcinoma, grade 3, estrogen and progesterone receptor positive, HER-2 amplified by immunohistochemistry (3+), with an MIB-1 of 70%.  (2) treated neo-adjuvantly with 4 of 6 planned cycles of docetaxel, carboplatin, trastuzumab,and pertuzumab, last dose 04/27/2013   (a) final 2 cycles of chemotherapy omitted in the face of multiple renal and electrolyte abnormalities    (b) pertuzumab and trastuzumab continued until 04/27/2013  (c) trastuzumab alone continued to complete one year of anti-HER-2 treatment, last dose 04/12/2014  (d) echocardiogram 03/31/14 showing an ejection fraction in the 60-65% range  (3) status post right lumpectomy and sentinel lymph node sampling 07/19/2013 showing a complete pathologic response (ypT0 ypN0)  (4) adjuvant radiation to the breast  completed on 11/29/13   (a) participating in RTOG 1304/ B-51, randomized to no regional nodal XRT  (5) began anastrozole daily on 12/22/13, discontinued February 2018 with arthralgias and myalgias.  (a) DEXA scan 01/17/2014 showed a  T score of -0.3 (normal).  PLAN: Azhane is now just about 3 years out from definitive surgery for her breast cancer with no evidence of disease recurrence.  She gave anastrozole a good try but she was not able to tolerated in the long run. Given her morbid obesity and clotting problems she is not a candidate for tamoxifen.  I think it is likely she would have similar side effects from other aromatase inhibitors and she is very much against making those attempts. The fact that she did have a complete pathologic response at the time of her right lumpectomy is a good prognostic factor Accordingly we are switching to observation at this point.  Operative see her again in June of next year, after her next mammogram.  She knows to call for any problems that may develop before that visit.    Chauncey Cruel, MD

## 2016-06-05 ENCOUNTER — Ambulatory Visit (INDEPENDENT_AMBULATORY_CARE_PROVIDER_SITE_OTHER): Payer: Medicare Other | Admitting: Orthopaedic Surgery

## 2016-06-06 ENCOUNTER — Other Ambulatory Visit: Payer: Self-pay | Admitting: Oncology

## 2016-06-06 DIAGNOSIS — C50411 Malignant neoplasm of upper-outer quadrant of right female breast: Secondary | ICD-10-CM

## 2016-06-12 ENCOUNTER — Ambulatory Visit (INDEPENDENT_AMBULATORY_CARE_PROVIDER_SITE_OTHER): Payer: Self-pay

## 2016-06-12 ENCOUNTER — Encounter (INDEPENDENT_AMBULATORY_CARE_PROVIDER_SITE_OTHER): Payer: Self-pay | Admitting: Orthopaedic Surgery

## 2016-06-12 ENCOUNTER — Ambulatory Visit (INDEPENDENT_AMBULATORY_CARE_PROVIDER_SITE_OTHER): Payer: Medicare Other | Admitting: Orthopaedic Surgery

## 2016-06-12 VITALS — BP 139/79 | HR 71 | Ht 64.0 in | Wt 292.0 lb

## 2016-06-12 DIAGNOSIS — M25562 Pain in left knee: Secondary | ICD-10-CM

## 2016-06-12 DIAGNOSIS — M25561 Pain in right knee: Secondary | ICD-10-CM | POA: Diagnosis not present

## 2016-06-12 DIAGNOSIS — M1711 Unilateral primary osteoarthritis, right knee: Secondary | ICD-10-CM

## 2016-06-12 DIAGNOSIS — G8929 Other chronic pain: Secondary | ICD-10-CM

## 2016-06-12 MED ORDER — METHYLPREDNISOLONE ACETATE 40 MG/ML IJ SUSP
40.0000 mg | INTRAMUSCULAR | Status: AC | PRN
  Administered 2016-06-12: 40 mg via INTRA_ARTICULAR

## 2016-06-12 MED ORDER — BUPIVACAINE HCL 0.25 % IJ SOLN
0.6600 mL | INTRAMUSCULAR | Status: AC | PRN
  Administered 2016-06-12: .66 mL via INTRA_ARTICULAR

## 2016-06-12 MED ORDER — LIDOCAINE HCL 1 % IJ SOLN
1.0000 mL | INTRAMUSCULAR | Status: AC | PRN
  Administered 2016-06-12: 1 mL

## 2016-06-12 NOTE — Progress Notes (Signed)
Office Visit Note   Patient: [name]           Date of Birth: August 11, 1955           MRN: [id number] Visit Date: 06/12/2016              Requested by: Mikey Kirschner, [place], [place] [id number] PCP: Mikey Kirschner, MD   Assessment & Plan: Visit Diagnoses:  1. Chronic pain of both knees   2. Unilateral primary osteoarthritis, right knee     Plan: Injection performed right knee which she tolerated well. We'll recheck her again in 2 months. We discussed previous bone scan findings compared to radiograph from 2018 years ago to today's x-rays. She has no durable medical problems and a BMI of 50. Hopefully she'll get some relief with the injection. She'll check her sugars carefully. Return 2 months.  Follow-Up Instructions: Return in about 2 months (around 08/12/2016).   Orders:  Orders Placed This Encounter  Procedures  . Large Joint Injection/Arthrocentesis  . XR Knee 1-2 Views Right  . XR Knee 1-2 Views Left   No orders of the defined types were placed in this encounter.     Procedures: Large Joint Inj Date/Time: 06/12/2016 12:06 PM Performed by: Marybelle Killings Authorized by: Marybelle Killings   Consent Given by:  Patient Site marked: the procedure site was marked   Indications:  Pain and joint swelling Location:  Knee Site:  R knee Needle Size:  22 G Needle Length:  1.5 inches Approach:  Anterolateral Ultrasound Guidance: No   Fluoroscopic Guidance: No   Arthrogram: No   Medications:  1 mL lidocaine 1 %; 40 mg methylPREDNISolone acetate 40 MG/ML; 0.66 mL bupivacaine 0.25 % Patient tolerance:  Patient tolerated the procedure well with no immediate complications     Clinical Data: No additional findings.   Subjective: Chief Complaint  Patient presents with  . Right Knee - Pain    HPI patient with painful bilateral knees. She has end-stage right knee osteoarthritis. She had left total knee arthroplasty done probably 15 years  ago. Previous x-rays 2008 on PACS system were reviewed. She had a bone scan that showed evidence of loose femoral component on the left knee. She only has 0-10 range of motion of her left knee. Right knee has become progressively arthritic and she is concerned that if anything is done the right knee she will be able to bend it. She has a rolling walker with reverse E she is here with her daughter .She denies fever chills any drainage from her knee no history of knee infection.  Review of Systems 14 point review of systems performed positive for hypertension asthma right breast cancer 3 years ago, diabetes, hypertension, thyroid condition. Stage II kidney disease, atrial flutter, mitral regurgitation, obstructive sleep apnea, morbid obesity, depression, iron deficiency anemia otherwise negative as it pertains history of present illness. Knee replacement Dr. Telford Nab approximately 15 years ago. Bone scan 2008 showed increased uptake around the femoral prosthesis of concern for possible loosening. Patient is on chronic Eliquis.  Objective: Vital Signs: BP 139/79   Pulse 71   Ht 5\' 4"  (1.626 m)   Wt 292 lb (132.5 kg)   BMI 50.12 kg/m   Physical Exam  Constitutional: She is oriented to person, place, and time. She appears well-developed.  HENT:  Head: Normocephalic.  Right Ear: External ear normal.  Left Ear: External ear normal.  Eyes: Pupils are  equal, round, and reactive to light.  Neck: No tracheal deviation present. No thyromegaly present.  Cardiovascular: Normal rate.   Pulmonary/Chest: Effort normal.  Abdominal: Soft.  Musculoskeletal:  Patient has no pain with hip range of motion. Left knee has only 0-10 or 15 flexion. She ambulates with her knee straight slight circumduction to clear heel. Opposite right knee flexes 110 severe crepitus with some varus deformity laxity due to medial compartment where severe patellofemoral crepitus 2+ knee effusion. Distal pulses are palpable.    Neurological: She is alert and oriented to person, place, and time.  Skin: Skin is warm and dry.  Psychiatric: She has a normal mood and affect. Her behavior is normal.    Ortho Exam  Specialty Comments:  No specialty comments available.  Imaging: Xr Knee 1-2 Views Left  Result Date: 06/12/2016 Standing AP both knees lateral left knee obtained. These are compared to previous x-rays 2008 on PACS system. There is lytic areas in the femoral prosthesis best seen on lateral x-ray with bone resorption suggestive of loosening. Some bone remodeling around the medial tibial tray without loosening of the tibial stem. Bone buildup along the anterior distal femur seen on lateral x-ray in the supracondylar region. Impression post left total knee arthroplasty with x-ray changes suggestive of femoral  loosening  Xr Knee 1-2 Views Right  Result Date: 06/12/2016 Standing AP both knees lateral x-ray right knee is reviewed that shows severe end-stage knee osteoarthritis right knee bone-on-bone flattening the medial femoral condyle subchondral sclerosis marginal osteophytes and patellofemoral severe changes. Impression severe end-stage right knee osteoarthritis with 15 varus deformity.    PMFS History: Patient Active Problem List   Diagnosis Date Noted  . Depression 05/24/2015  . OSA (obstructive sleep apnea) 04/04/2014  . GERD (gastroesophageal reflux disease) 12/29/2013  . Atrial flutter, paroxysmal ([facility]) 12/05/2013  . Mitral regurgitation 11/08/2013  . Atrial flutter ([facility]) 11/05/2013  . Narrow complex tachycardia ([facility]) 11/03/2013  . Sinus congestion 10/19/2013  . Cellulitis of breast, DDx inflammatroy Ca breast 09/06/2013  . Malnutrition of moderate degree ([place]) 05/11/2013  . CKD (chronic kidney disease) stage 2, GFR 60-89 ml/min 05/08/2013  . Hyponatremia 05/08/2013  . UTI (urinary tract infection) 05/04/2013  . Hypocalcemia 05/04/2013  . Hypothyroidism 05/04/2013  . Malignant neoplasm of  upper-outer quadrant of right breast in female, estrogen receptor positive ([facility]) 01/31/2013  . Atypical chest pain 02/12/2012  . Osteoarthritis   . Asthma 12/08/2010  . Degenerative joint disease 12/08/2010  . Obesity   . Anemia, iron deficiency   . Hyperlipidemia 10/16/2010  . Hypertension   . Diabetes mellitus, type 2 ([facility])   . Tobacco abuse, in remission    Past Medical History:  Diagnosis Date  . Asthma    prn neb. and inhaler  . Breast cancer ([facility]) 01/2013   right  . Dehydration 04/13/2013  . GERD (gastroesophageal reflux disease)   . Graves' disease   . Hyperlipidemia   . Hypertension    on multiple meds., has been on med. > 14 yr.  . Non-insulin dependent type 2 diabetes mellitus ([place] [facility])   . Obesity   . Ophthalmic manifestation of Graves disease   . Osteoarthritis 2003   left knee  . Radiation 10/11/13-11/29/13   Right Breast  . Sleep apnea 4/16   mod  . Wears glasses     Family History  Problem Relation Age of Onset  . Heart disease Father   . Lung cancer Father   . Diabetes Mother   .  Diabetes Sister   . Hypertension Sister   . Asthma Sister     Past Surgical History:  Procedure Laterality Date  . CARDIOVERSION N/A 11/08/2013   Procedure: CARDIOVERSION;  Surgeon: Lelon Perla, MD;  Location: [facility] ENDOSCOPY;  Service: Cardiovascular;  Laterality: N/A;  . COLONOSCOPY  2007  . COLONOSCOPY W/ POLYPECTOMY  2009  . EXCISION OF KELOID N/A 05/18/2014   Procedure: EXCISION OF KELOID;  Surgeon: Erroll Luna, MD;  Location: [facility];  Service: General;  Laterality: N/A;  . PORT-A-CATH REMOVAL Right 05/18/2014   Procedure: REMOVAL PORT-A-CATH;  Surgeon: Erroll Luna, MD;  Location: [facility];  Service: General;  Laterality: Right;  . PORTACATH PLACEMENT Right 02/17/2013   Procedure: INSERTION PORT-A-CATH;  Surgeon: Joyice Faster. Cornett, MD;  Location: [facility];  Service: General;  Laterality: Right;  . TEE WITHOUT  CARDIOVERSION N/A 11/08/2013   Procedure: TRANSESOPHAGEAL ECHOCARDIOGRAM (TEE);  Surgeon: Lelon Perla, MD;  Location: [facility];  Service: Cardiovascular;  Laterality: N/A;  . TOTAL KNEE ARTHROPLASTY Left 2003  . TOTAL THYROIDECTOMY    . TUBAL LIGATION  1985   Social History   Occupational History  . Disability    Social History Main Topics  . Smoking status: Former Smoker    Packs/day: 0.25    Years: 20.00    Quit date: 02/10/1991  . Smokeless tobacco: Never Used  . Alcohol use No  . Drug use: No  . Sexual activity: No

## 2016-07-08 ENCOUNTER — Other Ambulatory Visit: Payer: Self-pay | Admitting: Oncology

## 2016-07-08 DIAGNOSIS — C50411 Malignant neoplasm of upper-outer quadrant of right female breast: Secondary | ICD-10-CM

## 2016-07-24 ENCOUNTER — Telehealth: Payer: Self-pay | Admitting: Family Medicine

## 2016-07-24 NOTE — Telephone Encounter (Signed)
Patient needs a letter for jury duty stating due to her health issues unable to do so.She states she cant sit  that long or step up into the jury box,

## 2016-07-29 ENCOUNTER — Telehealth: Payer: Self-pay | Admitting: Family Medicine

## 2016-07-29 DIAGNOSIS — E119 Type 2 diabetes mellitus without complications: Secondary | ICD-10-CM | POA: Diagnosis not present

## 2016-07-29 MED ORDER — GLUCOSE BLOOD VI STRP: ORAL_STRIP | 5 refills | Status: DC

## 2016-07-29 NOTE — Telephone Encounter (Signed)
Pt is needing refills on ACCU-CHEK SMARTVIEW test strip    [facility]

## 2016-07-29 NOTE — Telephone Encounter (Signed)
Prescription faxed to pharmacy. Patient notified. 

## 2016-08-01 NOTE — Telephone Encounter (Signed)
Patient notified

## 2016-08-01 NOTE — Telephone Encounter (Signed)
Will be ready monday

## 2016-08-01 NOTE — Telephone Encounter (Signed)
Patient called to check on this letter.  She is hoping this can be done ASAP.

## 2016-08-04 DIAGNOSIS — E7800 Pure hypercholesterolemia, unspecified: Secondary | ICD-10-CM | POA: Diagnosis not present

## 2016-08-04 DIAGNOSIS — I10 Essential (primary) hypertension: Secondary | ICD-10-CM | POA: Diagnosis not present

## 2016-08-04 DIAGNOSIS — E1165 Type 2 diabetes mellitus with hyperglycemia: Secondary | ICD-10-CM | POA: Diagnosis not present

## 2016-08-04 DIAGNOSIS — E890 Postprocedural hypothyroidism: Secondary | ICD-10-CM | POA: Diagnosis not present

## 2016-08-04 NOTE — Telephone Encounter (Signed)
Done -notify

## 2016-08-04 NOTE — Telephone Encounter (Signed)
Spoke with patient and informed her per Dr.Steve Clayton for jury duty is ready for pick up. Patient verbalized understanding.

## 2016-08-07 ENCOUNTER — Other Ambulatory Visit: Payer: Self-pay | Admitting: Oncology

## 2016-08-07 DIAGNOSIS — C50411 Malignant neoplasm of upper-outer quadrant of right female breast: Secondary | ICD-10-CM

## 2016-09-09 ENCOUNTER — Other Ambulatory Visit: Payer: Self-pay | Admitting: Oncology

## 2016-09-09 DIAGNOSIS — C50411 Malignant neoplasm of upper-outer quadrant of right female breast: Secondary | ICD-10-CM

## 2016-09-11 ENCOUNTER — Ambulatory Visit (INDEPENDENT_AMBULATORY_CARE_PROVIDER_SITE_OTHER): Payer: Medicare Other | Admitting: Orthopaedic Surgery

## 2016-09-11 ENCOUNTER — Encounter (INDEPENDENT_AMBULATORY_CARE_PROVIDER_SITE_OTHER): Payer: Self-pay | Admitting: Orthopaedic Surgery

## 2016-09-11 VITALS — BP 157/85 | HR 73

## 2016-09-11 DIAGNOSIS — M1711 Unilateral primary osteoarthritis, right knee: Secondary | ICD-10-CM

## 2016-09-11 MED ORDER — LIDOCAINE HCL 1 % IJ SOLN
1.0000 mL | INTRAMUSCULAR | Status: AC | PRN
  Administered 2016-09-11: 1 mL

## 2016-09-11 MED ORDER — BUPIVACAINE HCL 0.25 % IJ SOLN
4.0000 mL | INTRAMUSCULAR | Status: AC | PRN
  Administered 2016-09-11: 4 mL via INTRA_ARTICULAR

## 2016-09-11 MED ORDER — BUPIVACAINE HCL 0.25 % IJ SOLN
0.6600 mL | INTRAMUSCULAR | Status: AC | PRN
  Administered 2016-09-11: .66 mL via INTRA_ARTICULAR

## 2016-09-11 MED ORDER — METHYLPREDNISOLONE ACETATE 40 MG/ML IJ SUSP
40.0000 mg | INTRAMUSCULAR | Status: AC | PRN
  Administered 2016-09-11: 40 mg via INTRA_ARTICULAR

## 2016-09-11 NOTE — Progress Notes (Signed)
Office Visit Note   Patient: [name]           Date of Birth: May 22, 1955           MRN: [id number] Visit Date: 09/11/2016              Requested by: Mikey Kirschner, [address] [place], [place] [id number] PCP: Mikey Kirschner, MD   Assessment & Plan: Visit Diagnoses:  1. Unilateral primary osteoarthritis, right knee     Plan: Injection performed with long discussion about potential problems with the total knee arthroplasty including problems with inability to flex the opposite knee and impaired ambulation if she was unable to get good flexion and a right total knee arthroplasty. Intra-articular injection performed will check her back again when necessary basis.  Follow-Up Instructions: Return if symptoms worsen or fail to improve.   Orders:  Orders Placed This Encounter  Procedures  . Large Joint Injection/Arthrocentesis   No orders of the defined types were placed in this encounter.     Procedures: Large Joint Inj Date/Time: 09/11/2016 12:03 PM Performed by: Marybelle Killings Authorized by: Rodell Perna C   Consent Given by:  Patient Indications:  Pain and joint swelling Location:  Knee Site:  R knee Needle Size:  22 G Needle Length:  1.5 inches Approach:  Anterolateral Ultrasound Guidance: No   Fluoroscopic Guidance: No   Arthrogram: No   Medications:  1 mL lidocaine 1 %; 40 mg methylPREDNISolone acetate 40 MG/ML; 0.66 mL bupivacaine 0.25 %; 4 mL bupivacaine 0.25 % Aspiration Attempted: No   Patient tolerance:  Patient tolerated the procedure well with no immediate complications     Clinical Data: No additional findings.   Subjective: Chief Complaint  Patient presents with  . Right Knee - Pain    HPI 61 year old returns she has the left on the arthroplasty that basically is straight she can't really flex more than 10 or 15 when she stands up she goes into valgus. Opposite right knee got relief for about 2 days with the injection  and she states she had recurrence of symptoms. She really hasn't lost any weight and her fears that if she has surgery on her right knee with total knee arthroplasty she is worried she will be a walk since her left knee will not band.  Review of Systems via systems updated is unchanged from her last office visit. She does have an irregularity has diabetes and is on  Eliquis.   Objective: Vital Signs: BP (!) 157/85   Pulse 73   Physical Exam  Constitutional: She is oriented to person, place, and time. She appears well-developed.  HENT:  Head: Normocephalic.  Right Ear: External ear normal.  Left Ear: External ear normal.  Eyes: Pupils are equal, round, and reactive to light.  Neck: No tracheal deviation present. No thyromegaly present.  Cardiovascular: Normal rate.   Pulmonary/Chest: Effort normal.  Abdominal: Soft.  Musculoskeletal:  Patient has significant crepitus in the extension she can flex to 110 right knee. When she gets from sitting to standing she flexes her chest to her thighs and then pushes herself up with her arms that she slowly slides her left heel out unable to bend her knee when she gets upright position. There is a 2+ synovitis right knee.  Neurological: She is alert and oriented to person, place, and time.  Skin: Skin is warm and dry.  Psychiatric: She has a normal mood and affect. Her  behavior is normal.    Ortho Exam  Specialty Comments:  No specialty comments available.  Imaging: No results found.   PMFS History: Patient Active Problem List   Diagnosis Date Noted  . Depression 05/24/2015  . OSA (obstructive sleep apnea) 04/04/2014  . GERD (gastroesophageal reflux disease) 12/29/2013  . Atrial flutter, paroxysmal ([place]) 12/05/2013  . Mitral regurgitation 11/08/2013  . Atrial flutter ([place]) 11/05/2013  . Narrow complex tachycardia ([place]) 11/03/2013  . Sinus congestion 10/19/2013  . Cellulitis of breast, DDx inflammatroy Ca breast 09/06/2013  .  Malnutrition of moderate degree ([place]) 05/11/2013  . CKD (chronic kidney disease) stage 2, GFR 60-89 ml/min 05/08/2013  . Hyponatremia 05/08/2013  . UTI (urinary tract infection) 05/04/2013  . Hypocalcemia 05/04/2013  . Hypothyroidism 05/04/2013  . Malignant neoplasm of upper-outer quadrant of right breast in female, estrogen receptor positive ([place]) 01/31/2013  . Atypical chest pain 02/12/2012  . Osteoarthritis   . Asthma 12/08/2010  . Degenerative joint disease 12/08/2010  . Obesity   . Anemia, iron deficiency   . Hyperlipidemia 10/16/2010  . Hypertension   . Diabetes mellitus, type 2 ([place])   . Tobacco abuse, in remission    Past Medical History:  Diagnosis Date  . Asthma    prn neb. and inhaler  . Breast cancer ([place]) 01/2013   right  . Dehydration 04/13/2013  . GERD (gastroesophageal reflux disease)   . Graves' disease   . Hyperlipidemia   . Hypertension    on multiple meds., has been on med. > 14 yr.  . Non-insulin dependent type 2 diabetes mellitus ([place])   . Obesity   . Ophthalmic manifestation of Graves disease   . Osteoarthritis 2003   left knee  . Radiation 10/11/13-11/29/13   Right Breast  . Sleep apnea 4/16   mod  . Wears glasses     Family History  Problem Relation Age of Onset  . Heart disease Father   . Lung cancer Father   . Diabetes Mother   . Diabetes Sister   . Hypertension Sister   . Asthma Sister     Past Surgical History:  Procedure Laterality Date  . CARDIOVERSION N/A 11/08/2013   Procedure: CARDIOVERSION;  Surgeon: Lelon Perla, MD;  Location: [facility] ENDOSCOPY;  Service: Cardiovascular;  Laterality: N/A;  . COLONOSCOPY  2007  . COLONOSCOPY W/ POLYPECTOMY  2009  . EXCISION OF KELOID N/A 05/18/2014   Procedure: EXCISION OF KELOID;  Surgeon: Erroll Luna, MD;  Location: [facility];  Service: General;  Laterality: N/A;  . PORT-A-CATH REMOVAL Right 05/18/2014   Procedure: REMOVAL PORT-A-CATH;  Surgeon: Erroll Luna, MD;  Location:  [facility];  Service: General;  Laterality: Right;  . PORTACATH PLACEMENT Right 02/17/2013   Procedure: INSERTION PORT-A-CATH;  Surgeon: Joyice Faster. Cornett, MD;  Location: [facility];  Service: General;  Laterality: Right;  . TEE WITHOUT CARDIOVERSION N/A 11/08/2013   Procedure: TRANSESOPHAGEAL ECHOCARDIOGRAM (TEE);  Surgeon: Lelon Perla, MD;  Location: [facility];  Service: Cardiovascular;  Laterality: N/A;  . TOTAL KNEE ARTHROPLASTY Left 2003  . TOTAL THYROIDECTOMY    . TUBAL LIGATION  1985   Social History   Occupational History  . Disability    Social History Main Topics  . Smoking status: Former Smoker    Packs/day: 0.25    Years: 20.00    Quit date: 02/10/1991  . Smokeless tobacco: Never Used  . Alcohol use No  . Drug use: No  .  Sexual activity: No

## 2016-09-16 DIAGNOSIS — H524 Presbyopia: Secondary | ICD-10-CM | POA: Diagnosis not present

## 2016-09-24 ENCOUNTER — Telehealth: Payer: Self-pay | Admitting: Family Medicine

## 2016-09-24 ENCOUNTER — Other Ambulatory Visit: Payer: Self-pay | Admitting: Nurse Practitioner

## 2016-09-24 DIAGNOSIS — J45909 Unspecified asthma, uncomplicated: Secondary | ICD-10-CM | POA: Diagnosis not present

## 2016-09-24 MED ORDER — ALBUTEROL SULFATE HFA 108 (90 BASE) MCG/ACT IN AERS: 2.0000 | INHALATION_SPRAY | RESPIRATORY_TRACT | 2 refills | Status: DC | PRN

## 2016-09-24 MED ORDER — ALBUTEROL SULFATE (2.5 MG/3ML) 0.083% IN NEBU: 2.5000 mg | INHALATION_SOLUTION | Freq: Four times a day (QID) | RESPIRATORY_TRACT | 0 refills | Status: DC | PRN

## 2016-09-24 NOTE — Telephone Encounter (Signed)
done

## 2016-09-24 NOTE — Telephone Encounter (Signed)
Patient needs refill on inhaler and also meds for her nebulizer. Wants these to go to [facility] in [place] not her regular pharmacy.  Albuterol (PROVENTIL HFA;VENTOLIN HFA) 108 (90 BASE) MCG/ACT inhaler   Albuterol (PROVENTIL) (2.5 MG/3ML) 0.083% nebulizer solution   *Next appt is 11-19-16

## 2016-10-10 ENCOUNTER — Other Ambulatory Visit: Payer: Self-pay | Admitting: Oncology

## 2016-10-10 DIAGNOSIS — C50411 Malignant neoplasm of upper-outer quadrant of right female breast: Secondary | ICD-10-CM

## 2016-10-25 ENCOUNTER — Other Ambulatory Visit: Payer: Self-pay | Admitting: Family Medicine

## 2016-10-27 ENCOUNTER — Other Ambulatory Visit (HOSPITAL_COMMUNITY): Payer: Self-pay | Admitting: Internal Medicine

## 2016-11-11 ENCOUNTER — Other Ambulatory Visit: Payer: Self-pay | Admitting: Oncology

## 2016-11-11 DIAGNOSIS — C50411 Malignant neoplasm of upper-outer quadrant of right female breast: Secondary | ICD-10-CM

## 2016-11-19 ENCOUNTER — Other Ambulatory Visit: Payer: Self-pay | Admitting: Family Medicine

## 2016-11-19 ENCOUNTER — Encounter: Payer: Self-pay | Admitting: Family Medicine

## 2016-11-19 ENCOUNTER — Ambulatory Visit: Payer: Medicare Other | Admitting: Family Medicine

## 2016-11-19 VITALS — BP 130/80 | Ht 65.0 in | Wt 305.1 lb

## 2016-11-19 DIAGNOSIS — E039 Hypothyroidism, unspecified: Secondary | ICD-10-CM | POA: Diagnosis not present

## 2016-11-19 DIAGNOSIS — I10 Essential (primary) hypertension: Secondary | ICD-10-CM | POA: Diagnosis not present

## 2016-11-19 DIAGNOSIS — E785 Hyperlipidemia, unspecified: Secondary | ICD-10-CM

## 2016-11-19 DIAGNOSIS — Z23 Encounter for immunization: Secondary | ICD-10-CM | POA: Diagnosis not present

## 2016-11-19 MED ORDER — PRAVASTATIN SODIUM 80 MG PO TABS: ORAL_TABLET | ORAL | 5 refills | Status: DC

## 2016-11-19 MED ORDER — LEVOTHYROXINE SODIUM 88 MCG PO TABS: 88.0000 ug | ORAL_TABLET | Freq: Every day | ORAL | 5 refills | Status: DC

## 2016-11-19 MED ORDER — LORATADINE 10 MG PO TABS: 10.0000 mg | ORAL_TABLET | Freq: Every day | ORAL | 5 refills | Status: DC

## 2016-11-19 MED ORDER — BENAZEPRIL HCL 40 MG PO TABS: 40.0000 mg | ORAL_TABLET | Freq: Every day | ORAL | 5 refills | Status: DC

## 2016-11-19 MED ORDER — ALBUTEROL SULFATE (2.5 MG/3ML) 0.083% IN NEBU: 2.5000 mg | INHALATION_SOLUTION | Freq: Four times a day (QID) | RESPIRATORY_TRACT | 0 refills | Status: DC | PRN

## 2016-11-19 MED ORDER — AMLODIPINE BESYLATE 10 MG PO TABS: ORAL_TABLET | ORAL | 1 refills | Status: DC

## 2016-11-19 MED ORDER — ALBUTEROL SULFATE HFA 108 (90 BASE) MCG/ACT IN AERS: 2.0000 | INHALATION_SPRAY | RESPIRATORY_TRACT | 2 refills | Status: DC | PRN

## 2016-11-19 MED ORDER — GLUCOSE BLOOD VI STRP: ORAL_STRIP | 5 refills | Status: DC

## 2016-11-19 MED ORDER — CARVEDILOL 12.5 MG PO TABS: ORAL_TABLET | ORAL | 1 refills | Status: DC

## 2016-11-19 NOTE — Progress Notes (Signed)
   Subjective:    Patient ID: [name], female    DOB: 02/20/1955, 61 y.o.   MRN: [id number]  Hypertension  This is a chronic problem. The current episode started more than 1 year ago.    Patient states no other concerns this visit.   Blood pressure medicine and blood pressure levels reviewed today with patient. Compliant with blood pressure medicine. States does not miss a dose. No obvious side effects. Blood pressure generally good when checked elsewhere. Watching salt intake.  Patient continues to take lipid medication regularly. No obvious side effects from it. Generally does not miss a dose. Prior blood work results are reviewed with patient. Patient continues to work on fat intake in diet  Doe wheel chair ercise twice per wk  Then ups it after several weeks   Asthma so far so good, not much pro  Now use his albuterol couple times per week.  Overall control symptoms fairly well.  Compliant with thyroid medicine.  Is not missing doses.  Prior blood work reviewed.  Patient to maintain same  Flu shot today       Exercising more  Review of Systems No headache, no major weight loss or weight gain, no chest pain no back pain abdominal pain no change in bowel habits complete ROS otherwise negative Impression 1 hypertension good control discussed    Objective:   Physical Exam Alert and oriented, vitals reviewed and stable, NAD ENT-TM's and ext canals WNL bilat via otoscopic exam Soft palate, tonsils and post pharynx WNL via oropharyngeal exam Neck-symmetric, no masses; thyroid nonpalpable and nontender Pulmonary-no tachypnea or accessory muscle use; Clear without wheezes via auscultation Card--no abnrml murmurs, rhythm reg and rate WNL Carotid pulses symmetric, without bruits        Assessment & Plan:  Impression #1 hypertension good control.  Discussed to maintain same meds #2 hypothyroidism.  Prior blood work reviewed.  Patient to maintain same dose plans  discussed  3.  Morbid obesity discussed exercise encouraged  4.  Hyperlipidemia.  Prior blood work reviewed.  Medications refilled per the recommendations based on blood test.  Flu shot.  Diet exercise discussed.  Follow-up in 6 months

## 2016-11-20 LAB — LIPID PANEL
CHOLESTEROL TOTAL: 148 mg/dL (ref 100–199)
Chol/HDL Ratio: 2.3 ratio (ref 0.0–4.4)
HDL: 63 mg/dL (ref >39)
LDL CALC: 65 mg/dL (ref 0–99)
TRIGLYCERIDES: 99 mg/dL (ref 0–149)
VLDL Cholesterol Cal: 20 mg/dL (ref 5–40)

## 2016-11-20 LAB — HEPATIC FUNCTION PANEL
ALT: 13 IU/L (ref 0–32)
AST: 20 IU/L (ref 0–40)
Albumin: 4.5 g/dL (ref 3.6–4.8)
Alkaline Phosphatase: 89 IU/L (ref 39–117)
BILIRUBIN TOTAL: 0.4 mg/dL (ref 0.0–1.2)
BILIRUBIN, DIRECT: 0.15 mg/dL (ref 0.00–0.40)
Total Protein: 7.6 g/dL (ref 6.0–8.5)

## 2016-11-23 ENCOUNTER — Encounter: Payer: Self-pay | Admitting: Family Medicine

## 2016-11-28 ENCOUNTER — Encounter: Payer: Self-pay | Admitting: Family Medicine

## 2016-11-28 ENCOUNTER — Ambulatory Visit (HOSPITAL_COMMUNITY)
Admission: RE | Admit: 2016-11-28 | Discharge: 2016-11-28 | Disposition: A | Discharge status: Home / Self Care | Payer: Medicare Other | Source: Ambulatory Visit | Attending: Family Medicine | Admitting: Family Medicine

## 2016-11-28 ENCOUNTER — Other Ambulatory Visit (HOSPITAL_COMMUNITY): Payer: Self-pay | Admitting: Internal Medicine

## 2016-11-28 ENCOUNTER — Ambulatory Visit: Payer: Medicare Other | Admitting: Family Medicine

## 2016-11-28 VITALS — BP 160/88 | Temp 98.5°F | Ht 65.0 in | Wt 305.0 lb

## 2016-11-28 DIAGNOSIS — M79642 Pain in left hand: Secondary | ICD-10-CM | POA: Diagnosis not present

## 2016-11-28 DIAGNOSIS — M19042 Primary osteoarthritis, left hand: Secondary | ICD-10-CM | POA: Diagnosis not present

## 2016-11-28 MED ORDER — PREDNISONE 10 MG PO TABS: ORAL_TABLET | ORAL | 0 refills | Status: DC

## 2016-11-28 NOTE — Progress Notes (Signed)
   Subjective:    Patient ID: [name], female    DOB: 10-Aug-1955, 61 y.o.   MRN: [id number]  HPI  Patient is here today with complaints of sharp left hand pain which is her dominate hand. She states Saturday she was trying to get out of the bed she used the hand to help get herself out of the bed and she states a sharp pain ran through her hand up her thumb.She has had the pain go all the wasy up to elbow. She uses the heating pad,ice packs,tylenol,and has sooaked in epsom salt, which have not helped. Review of Systems No headache, no major weight loss or weight gain, no chest pain no back pain abdominal pain no change in bowel habits complete ROS otherwise negative     Objective:   Physical Exam Alert vitals stable, NAD. Blood pressure good on repeat. HEENT normal. Lungs clear. Heart regular rate and rhythm. Left hand dorsal metacarpal tenderness to deep palpation wrist good range of motion  Sent for x-ray.  X-ray negative     Assessment & Plan:  Impression tendon strain mid hand.  Highly doubt fracture.  Prednisone taper.  Sugar increase potential discussed

## 2016-12-11 ENCOUNTER — Other Ambulatory Visit: Payer: Self-pay | Admitting: Oncology

## 2016-12-11 ENCOUNTER — Other Ambulatory Visit: Payer: Self-pay | Admitting: Family Medicine

## 2016-12-11 DIAGNOSIS — C50411 Malignant neoplasm of upper-outer quadrant of right female breast: Secondary | ICD-10-CM

## 2016-12-11 NOTE — Telephone Encounter (Signed)
Ok plus one ref 

## 2016-12-12 DIAGNOSIS — E119 Type 2 diabetes mellitus without complications: Secondary | ICD-10-CM | POA: Diagnosis not present

## 2016-12-26 ENCOUNTER — Telehealth: Payer: Self-pay | Admitting: Family Medicine

## 2016-12-26 ENCOUNTER — Other Ambulatory Visit: Payer: Self-pay | Admitting: Family Medicine

## 2016-12-26 ENCOUNTER — Other Ambulatory Visit (HOSPITAL_COMMUNITY): Payer: Self-pay | Admitting: Internal Medicine

## 2016-12-26 MED ORDER — APIXABAN 5 MG PO TABS: ORAL_TABLET | ORAL | 6 refills | Status: DC

## 2016-12-26 NOTE — Telephone Encounter (Signed)
Patient is aware we will take over.Rx sent to the pharmacy.

## 2016-12-26 NOTE — Telephone Encounter (Signed)
Please advise.Thanks,CS 

## 2016-12-26 NOTE — Telephone Encounter (Signed)
Patient calling because her pharmacy is sending a refill request to [place] for her ELIQUIS 5 MG TABS .  She had been getting it from her Cardiologist but she hasn't seen them in a while and wants to start getting it here.  She said she was seen recently and had blood work here.

## 2016-12-26 NOTE — Telephone Encounter (Signed)
Reviewed, chronic a flutter, reasonable to refil six mo worth

## 2017-01-10 ENCOUNTER — Other Ambulatory Visit: Payer: Self-pay | Admitting: Oncology

## 2017-01-10 DIAGNOSIS — C50411 Malignant neoplasm of upper-outer quadrant of right female breast: Secondary | ICD-10-CM

## 2017-02-03 DIAGNOSIS — E890 Postprocedural hypothyroidism: Secondary | ICD-10-CM | POA: Diagnosis not present

## 2017-02-03 DIAGNOSIS — I10 Essential (primary) hypertension: Secondary | ICD-10-CM | POA: Diagnosis not present

## 2017-02-03 DIAGNOSIS — E7800 Pure hypercholesterolemia, unspecified: Secondary | ICD-10-CM | POA: Diagnosis not present

## 2017-02-03 DIAGNOSIS — E1165 Type 2 diabetes mellitus with hyperglycemia: Secondary | ICD-10-CM | POA: Diagnosis not present

## 2017-02-10 ENCOUNTER — Other Ambulatory Visit: Payer: Self-pay | Admitting: Family Medicine

## 2017-02-10 NOTE — Telephone Encounter (Signed)
Ok plus five monthly ref 

## 2017-02-12 ENCOUNTER — Other Ambulatory Visit: Payer: Self-pay | Admitting: Oncology

## 2017-02-12 DIAGNOSIS — C50411 Malignant neoplasm of upper-outer quadrant of right female breast: Secondary | ICD-10-CM

## 2017-03-18 ENCOUNTER — Other Ambulatory Visit: Payer: Self-pay | Admitting: Oncology

## 2017-03-18 DIAGNOSIS — C50411 Malignant neoplasm of upper-outer quadrant of right female breast: Secondary | ICD-10-CM

## 2017-04-09 ENCOUNTER — Other Ambulatory Visit: Payer: Self-pay

## 2017-04-09 ENCOUNTER — Telehealth: Payer: Self-pay | Admitting: Family Medicine

## 2017-04-09 MED ORDER — BENAZEPRIL HCL 40 MG PO TABS: 40.0000 mg | ORAL_TABLET | Freq: Every day | ORAL | 1 refills | Status: DC

## 2017-04-09 NOTE — Telephone Encounter (Signed)
Ok six mo worth 

## 2017-04-09 NOTE — Telephone Encounter (Signed)
Request from [facility] for new prescription and refills for a 90 day supply. Benazepril Tab 40mg . (Send to [facility].)

## 2017-04-17 ENCOUNTER — Other Ambulatory Visit: Payer: Self-pay | Admitting: Family Medicine

## 2017-04-17 ENCOUNTER — Other Ambulatory Visit: Payer: Self-pay | Admitting: Oncology

## 2017-04-17 DIAGNOSIS — C50411 Malignant neoplasm of upper-outer quadrant of right female breast: Secondary | ICD-10-CM

## 2017-04-27 DIAGNOSIS — G4733 Obstructive sleep apnea (adult) (pediatric): Secondary | ICD-10-CM | POA: Diagnosis not present

## 2017-05-16 DIAGNOSIS — E119 Type 2 diabetes mellitus without complications: Secondary | ICD-10-CM | POA: Diagnosis not present

## 2017-05-18 ENCOUNTER — Other Ambulatory Visit: Payer: Self-pay | Admitting: Oncology

## 2017-05-18 DIAGNOSIS — C50411 Malignant neoplasm of upper-outer quadrant of right female breast: Secondary | ICD-10-CM

## 2017-05-19 ENCOUNTER — Ambulatory Visit: Payer: Medicare Other | Admitting: Family Medicine

## 2017-05-27 ENCOUNTER — Other Ambulatory Visit: Payer: Self-pay | Admitting: Family Medicine

## 2017-06-02 ENCOUNTER — Ambulatory Visit: Payer: Medicare Other | Admitting: Family Medicine

## 2017-06-02 ENCOUNTER — Encounter: Payer: Self-pay | Admitting: Family Medicine

## 2017-06-02 VITALS — BP 126/78 | Ht 65.0 in | Wt 303.8 lb

## 2017-06-02 DIAGNOSIS — E039 Hypothyroidism, unspecified: Secondary | ICD-10-CM | POA: Diagnosis not present

## 2017-06-02 DIAGNOSIS — I10 Essential (primary) hypertension: Secondary | ICD-10-CM | POA: Diagnosis not present

## 2017-06-02 DIAGNOSIS — E785 Hyperlipidemia, unspecified: Secondary | ICD-10-CM | POA: Diagnosis not present

## 2017-06-02 MED ORDER — AMLODIPINE BESYLATE 10 MG PO TABS: ORAL_TABLET | ORAL | 1 refills | Status: DC

## 2017-06-02 MED ORDER — APIXABAN 5 MG PO TABS: ORAL_TABLET | ORAL | 5 refills | Status: DC

## 2017-06-02 MED ORDER — PRAVASTATIN SODIUM 80 MG PO TABS: ORAL_TABLET | ORAL | 5 refills | Status: DC

## 2017-06-02 MED ORDER — BENAZEPRIL HCL 40 MG PO TABS: 40.0000 mg | ORAL_TABLET | Freq: Every day | ORAL | 1 refills | Status: DC

## 2017-06-02 NOTE — Progress Notes (Signed)
   Subjective:    Patient ID: [name], female    DOB: 01-20-1955, 62 y.o.   MRN: [id number]   Hypertension   This is a chronic problem. The current episode started more than 1 year ago. Risk factors for coronary artery disease include diabetes mellitus, dyslipidemia, obesity, post-menopausal state and sedentary lifestyle. Treatments tried: coreg, lotensin, norvasc.  There are no compliance problems.    Blood pressure medicine and blood pressure levels reviewed today with patient. Compliant with blood pressure medicine. States does not miss a dose. No obvious side effects. Blood pressure generally good when checked elsewhere. Watching salt intake.   Patient continues to take lipid medication regularly. No obvious side effects from it. Generally does not miss a dose. Prior blood work results are reviewed with patient. Patient continues to work on fat intake in diet  Bit more wheezing recently compare  To normal , using inhaler nearly every day     compliant with Eliquis.  Takes for history of atrial flutter.  Though wheezing most days does not want to get on a costly preventive agent.  Exercising some.  Mostly watching her diet   Review of Systems No headache, no major weight loss or weight gain, no chest pain no back pain abdominal pain no change in bowel habits complete ROS otherwise negative     Objective:   Physical Exam Alert and oriented, vitals reviewed and stable, NAD ENT-TM's and ext canals WNL bilat via otoscopic exam Soft palate, tonsils and post pharynx WNL via oropharyngeal exam Neck-symmetric, no masses; thyroid nonpalpable and nontender Pulmonary-no tachypnea or accessory muscle use; Clear without wheezes via auscultation Card--no abnrml murmurs, rhythm reg and rate WNL Carotid pulses symmetric, without bruits        Assessment & Plan:  1 impression hypertension.  Good control.  Discussed.  To maintain same meds.  Compliance discussed  2.   Hyperlipidemia.  Current status uncertain.  Claims compliance.  Working on diet  3.  Asthma fair control does not also.  Patient reluctant to initiate steroid inhaler due to high cost.  4.  Anticoagulation still on Eliquis with history of atrial flutter  Appropriate blood work diet exercise discussed medications refilled follow-up in 6 months

## 2017-06-03 LAB — HEPATIC FUNCTION PANEL
ALBUMIN: 4.7 g/dL (ref 3.6–4.8)
ALT: 17 IU/L (ref 0–32)
AST: 22 IU/L (ref 0–40)
Alkaline Phosphatase: 91 IU/L (ref 39–117)
BILIRUBIN TOTAL: 0.4 mg/dL (ref 0.0–1.2)
Bilirubin, Direct: 0.13 mg/dL (ref 0.00–0.40)
Total Protein: 7.6 g/dL (ref 6.0–8.5)

## 2017-06-03 LAB — BASIC METABOLIC PANEL
BUN/Creatinine Ratio: 11 — ABNORMAL LOW (ref 12–28)
BUN: 11 mg/dL (ref 8–27)
CALCIUM: 9.2 mg/dL (ref 8.7–10.3)
CHLORIDE: 101 mmol/L (ref 96–106)
CO2: 23 mmol/L (ref 20–29)
Creatinine, Ser: 1 mg/dL (ref 0.57–1.00)
GFR calc Af Amer: 70 mL/min/{1.73_m2} (ref >59)
GFR, EST NON AFRICAN AMERICAN: 61 mL/min/{1.73_m2} (ref >59)
GLUCOSE: 153 mg/dL — AB (ref 65–99)
POTASSIUM: 4.4 mmol/L (ref 3.5–5.2)
SODIUM: 142 mmol/L (ref 134–144)

## 2017-06-03 LAB — LIPID PANEL
CHOL/HDL RATIO: 2.3 ratio (ref 0.0–4.4)
Cholesterol, Total: 161 mg/dL (ref 100–199)
HDL: 71 mg/dL (ref >39)
LDL Calculated: 72 mg/dL (ref 0–99)
TRIGLYCERIDES: 89 mg/dL (ref 0–149)
VLDL Cholesterol Cal: 18 mg/dL (ref 5–40)

## 2017-06-03 LAB — TSH: TSH: 3.15 u[IU]/mL (ref 0.450–4.500)

## 2017-06-09 DIAGNOSIS — E7800 Pure hypercholesterolemia, unspecified: Secondary | ICD-10-CM | POA: Diagnosis not present

## 2017-06-09 DIAGNOSIS — E890 Postprocedural hypothyroidism: Secondary | ICD-10-CM | POA: Diagnosis not present

## 2017-06-09 DIAGNOSIS — E1165 Type 2 diabetes mellitus with hyperglycemia: Secondary | ICD-10-CM | POA: Diagnosis not present

## 2017-06-09 DIAGNOSIS — I10 Essential (primary) hypertension: Secondary | ICD-10-CM | POA: Diagnosis not present

## 2017-06-11 ENCOUNTER — Telehealth: Payer: Self-pay | Admitting: Family Medicine

## 2017-06-11 NOTE — Telephone Encounter (Signed)
Let's do 

## 2017-06-11 NOTE — Telephone Encounter (Signed)
Patient is requesting her lab results to be sent to her diabetic specialist Dr.Balan.

## 2017-06-14 ENCOUNTER — Encounter: Payer: Self-pay | Admitting: Family Medicine

## 2017-06-18 ENCOUNTER — Other Ambulatory Visit: Payer: Self-pay | Admitting: Family Medicine

## 2017-06-18 ENCOUNTER — Other Ambulatory Visit: Payer: Self-pay | Admitting: Oncology

## 2017-06-18 DIAGNOSIS — C50411 Malignant neoplasm of upper-outer quadrant of right female breast: Secondary | ICD-10-CM

## 2017-06-29 NOTE — Progress Notes (Signed)
[name]  [phone] [phone]    ID: [name] OB: 1955/11/24  MR#: [id number]  WVP#:[id number]  PCP: Mikey Kirschner, MD GYN:   SU: Dr. Erroll Luna OTHER MD: Dr. Thea Silversmith  CHIEF COMPLAINT: Estrogen receptor positive breast cancer  TREATMENT: observation  BREAST CANCER HISTORY: From Dr Dana Allan original intake note:  The patient herself noted a change in her right breast November 2014, but did not tell her family until after Christmas at the year. They "pretty much forced me" to have a mammogram and bilateral diagnostic mammography and right ultrasound 01/19/2013 at the breast Center showed an irregular mass associated with pleomorphic calcifications in the upper-outer quadrant of the right breast measuring 1.6 cm. There was an abnormal appearing right axillary lymph node. On physical exam, there was a movable firm palpable mass in the right breast measuring approximately 2 cm by palpation. Ultrasound showed this to be an irregularly marginated hypoechoic mass measuring 1.8 cm. In the right axilla the ultrasound showed a 1.3 cm abnormal appearing level I lymph node (loss a fatty hilum).  The 01/26/2013 the patient underwent biopsy of both the right breast mass in question and a suspicious right axillary lymph node. 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   Her subsequent history is as detailed below.  INTERVAL HISTORY: [name] returns today for follow-up of her estrogen receptor positive breast cancer accompanied by her daughter. She continues under observation. The interval history is generally unremarkable. She  continues to have significant knee problems that are very limiting for her. She denies any breast-related pain, discomfort, or changes.    Her last mammogram was 06/03/2016. She usually goes to [facility] for mammography. She has not yet had mammography this year.    REVIEW OF SYSTEMS: [name] reports that she has trouble with both knees and she uses a sitting walker. She went to the orthopedics who advised surgery on the right knee. She notes that is more concerned about not being able to bend her left knee. She is considering going to the [facility] clinic who take a non-surgical approaches to knee arthritis. She denies unusual headaches, visual changes, nausea, vomiting, or dizziness. There has been no unusual cough, phlegm production, or pleurisy. This been no change in bowel or bladder habits. She denies unexplained fatigue or unexplained weight loss, bleeding, rash, or fever. A detailed review of systems was otherwise stable.    PAST MEDICAL HISTORY: Past Medical History:  Diagnosis Date  . Asthma    prn neb. and inhaler  . Breast cancer ([place]) 01/2013   right  . Dehydration 04/13/2013  . GERD (gastroesophageal reflux disease)   . Graves' disease   . Hyperlipidemia   . Hypertension    on multiple meds., has been on med. > 14 yr.  . Non-insulin dependent type 2 diabetes mellitus ([place])   . Obesity   . Ophthalmic manifestation of Graves disease   . Osteoarthritis 2003   left knee  . Radiation 10/11/13-11/29/13   Right Breast  . Sleep apnea 4/16   mod  . Wears glasses     PAST SURGICAL HISTORY: Past Surgical History:  Procedure Laterality Date  . CARDIOVERSION N/A 11/08/2013  Procedure: CARDIOVERSION;  Surgeon: Lelon Perla, MD;  Location: [facility] ENDOSCOPY;  Service: Cardiovascular;  Laterality: N/A;  . COLONOSCOPY  2007  . COLONOSCOPY W/ POLYPECTOMY  2009  . EXCISION OF KELOID N/A 05/18/2014   Procedure: EXCISION OF KELOID;  Surgeon: Erroll Luna, MD;  Location: [place];  Service: General;  Laterality: N/A;  . PORT-A-CATH REMOVAL Right 05/18/2014   Procedure: REMOVAL PORT-A-CATH;  Surgeon: Erroll Luna, MD;  Location: [place];  Service: General;  Laterality: Right;  . PORTACATH PLACEMENT Right 02/17/2013   Procedure: INSERTION PORT-A-CATH;  Surgeon: Joyice Faster. Cornett, MD;  Location: [place];  Service: General;  Laterality: Right;  . TEE WITHOUT CARDIOVERSION N/A 11/08/2013   Procedure: TRANSESOPHAGEAL ECHOCARDIOGRAM (TEE);  Surgeon: Lelon Perla, MD;  Location: [place];  Service: Cardiovascular;  Laterality: N/A;  . TOTAL KNEE ARTHROPLASTY Left 2003  . TOTAL THYROIDECTOMY    . TUBAL LIGATION  1985    FAMILY HISTORY Family History  Problem Relation Age of Onset  . Heart disease Father   . Lung cancer Father   . Diabetes Mother   . Diabetes Sister   . Hypertension Sister   . Asthma Sister    the patient's father died from lung cancer at the age of 24. The patient's mother died from thyroid cancer at the age of 57. The patient had 2 brothers, 2 sisters. There is no other history of breast or ovarian cancer in the family to her knowledge  GYN HISTORY: Menarche age 36, first live birth age 48. The patient is GX P2. She went through the change of life approximately age 79. She did not take hormone replacement.  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 She is now disabled secondary to her knee problems. She is divorced. At home she lives with her 2 daughters, [name] who works in direct supports to mentally retarded children, and [name], who is a group Games developer for mentally retarded children. The patient has no grandchildren. She attends a [employer]  ADVANCED DIRECTIVES: Not in place; these have been discussed with the patient, most recently in the 07/28/2013 visit.  HEALTH MAINTENANCE: Social History   Tobacco Use  .  Smoking status: Former Smoker    Packs/day: 0.25    Years: 20.00    Pack years: 5.00    Last attempt to quit: 02/10/1991    Years since quitting: 26.4  . Smokeless tobacco: Never Used  Substance Use Topics  . Alcohol use: No  . Drug use: No      Allergies  Allergen Reactions  . Procardia [Nifedipine] Other (See Comments)    MIGRAINES  . Aspirin     Unable to take due to blood pressure medication  . Diltiazem Itching    Current Outpatient Medications  Medication Sig Dispense Refill  . acetaminophen (TYLENOL) 325 MG tablet Take 2 tablets (650 mg total) by mouth every 6 (six) hours as needed for mild pain or headache.    . albuterol (PROVENTIL HFA;VENTOLIN HFA) 108 (90 Base) MCG/ACT inhaler Inhale 2 puffs every 4 (four) hours as needed into the lungs. 18 g 2  . albuterol (PROVENTIL) (2.5 MG/3ML) 0.083% nebulizer solution Take 3 mLs (2.5 mg total) every 6 (six) hours as needed by nebulization for wheezing or shortness of breath. 150 mL 0  . amLODipine (NORVASC) 10 MG tablet TAKE (1) TABLET BY MOUTH ONCE DAILY. 90 tablet  1  . apixaban (ELIQUIS) 5 MG TABS tablet TAKE (1) TABLET BY MOUTH TWICE DAILY. 60 tablet 5  . Artificial Tear Ointment (ARTIFICIAL TEARS) ointment Place 1 drop into both eyes as needed (for grey eye disease).     . benazepril (LOTENSIN) 40 MG tablet Take 1 tablet (40 mg total) by mouth daily. 90 tablet 1  . Biotin 1000 MCG tablet Take 1,000 mcg by mouth daily.    . calcium carbonate (TUMS EX) 750 MG chewable tablet Chew 2 tablets by mouth 4 (four) times daily.    . carvedilol (COREG) 12.5 MG tablet TAKE (1) TABLET BY MOUTH TWICE DAILY. 180 tablet 0  . cholecalciferol (VITAMIN D) 1000 UNITS tablet Take 1,000 Units by mouth daily.    Marland Kitchen gabapentin (NEURONTIN) 100 MG capsule TAKE 1 CAPSULE BY MOUTH THREE TIMES A DAY. 90 capsule 0  . glucose blood (ACCU-CHEK SMARTVIEW) test strip USE ONE STRIP TO CHECK GLUCOSE 4 TIMES DAILY AS DIRECTED-E11.9 100 each 5  . glucose blood  (ACCU-CHEK SMARTVIEW) test strip USE ONE TEST STRIP TO CHECK BLOOD SUGAR 4 TIMES DAILY 100 each 5  . HUMALOG MIX 75/25 KWIKPEN (75-25) 100 UNIT/ML Kwikpen   11  . Insulin Pen Needle (UNIFINE PENTIPS) 31G X 8 MM MISC Use to inject insulin one time daily    . levothyroxine (SYNTHROID, LEVOTHROID) 88 MCG tablet TAKE (1) TABLET BY MOUTH ONCE DAILY. 30 tablet 0  . loratadine (CLARITIN) 10 MG tablet Take 1 tablet (10 mg total) daily by mouth. 30 tablet 5  . pravastatin (PRAVACHOL) 80 MG tablet TAKE (1) TABLET BY MOUTH AT BEDTIME. 30 tablet 5  . predniSONE (DELTASONE) 10 MG tablet Take 4 qd for 3 days then 3 qd for 3 days (Patient not taking: Reported on 06/02/2017) 21 tablet 0  . traMADol (ULTRAM) 50 MG tablet TAKE 1 TABLET ONCE DAILY AS NEEDED FOR PAIN. 30 tablet 5   Current Facility-Administered Medications  Medication Dose Route Frequency Provider Last Rate Last Dose  . methylPREDNISolone acetate (DEPO-MEDROL) injection 40 mg  40 mg Intra-articular Once Mikey Kirschner, MD        OBJECTIVE: morbidly obese African American woman who had great difficulty climbing onto the examination table  Vitals:   06/30/17 1400  BP: (!) 158/78  Pulse: 89  Resp: 18  Temp: 98.7 F (37.1 C)  SpO2: 99%     Body mass index is 51.07 kg/m.     ECOG: 2  Sclerae unicteric, pupils round and equal No cervical or supraclavicular adenopathy Lungs no rales or rhonchi Heart regular rate and rhythm Abd soft, obese, nontender, positive bowel sounds MSK no focal spinal tenderness, left knee fixed at 180 degrees Neuro: nonfocal, well oriented, positive affect Breasts: The right breast is status post lumpectomy and radiation.  There is mild distortion of the breast contour and the breast is smaller than the left but otherwise there is no evidence of disease recurrence.  The left breast is benign.  Both axillae are benign.  LAB RESULTS:  CMP     Component Value Date/Time   NA 142 06/02/2017 1146   NA 143  03/06/2016 1252   K 4.4 06/02/2017 1146   K 4.3 03/06/2016 1252   CL 101 06/02/2017 1146   CO2 23 06/02/2017 1146   CO2 27 03/06/2016 1252   GLUCOSE 153 (H) 06/02/2017 1146   GLUCOSE 153 (H) 03/06/2016 1252   BUN 11 06/02/2017 1146   BUN 17.3 03/06/2016 1252   CREATININE  1.00 06/02/2017 1146   CREATININE 1.3 (H) 03/06/2016 1252   CALCIUM 9.2 06/02/2017 1146   CALCIUM 9.6 03/06/2016 1252   PROT 7.6 06/02/2017 1146   PROT 8.0 03/06/2016 1252   ALBUMIN 4.7 06/02/2017 1146   ALBUMIN 4.0 03/06/2016 1252   AST 22 06/02/2017 1146   AST 20 03/06/2016 1252   ALT 17 06/02/2017 1146   ALT 13 03/06/2016 1252   ALKPHOS 91 06/02/2017 1146   ALKPHOS 94 03/06/2016 1252   BILITOT 0.4 06/02/2017 1146   BILITOT 0.43 03/06/2016 1252   GFRNONAA 61 06/02/2017 1146   GFRAA 70 06/02/2017 1146    I No results found for: SPEP  Lab Results  Component Value Date   WBC 5.0 06/30/2017   NEUTROABS 3.0 06/30/2017   HGB 12.2 06/30/2017   HCT 37.2 06/30/2017   MCV 84.5 06/30/2017   PLT 242 06/30/2017      Chemistry      Component Value Date/Time   NA 142 06/02/2017 1146   NA 143 03/06/2016 1252   K 4.4 06/02/2017 1146   K 4.3 03/06/2016 1252   CL 101 06/02/2017 1146   CO2 23 06/02/2017 1146   CO2 27 03/06/2016 1252   BUN 11 06/02/2017 1146   BUN 17.3 03/06/2016 1252   CREATININE 1.00 06/02/2017 1146   CREATININE 1.3 (H) 03/06/2016 1252      Component Value Date/Time   CALCIUM 9.2 06/02/2017 1146   CALCIUM 9.6 03/06/2016 1252   ALKPHOS 91 06/02/2017 1146   ALKPHOS 94 03/06/2016 1252   AST 22 06/02/2017 1146   AST 20 03/06/2016 1252   ALT 17 06/02/2017 1146   ALT 13 03/06/2016 1252   BILITOT 0.4 06/02/2017 1146   BILITOT 0.43 03/06/2016 1252       No results found for: LABCA2  No components found for: LABCA125  No results for input(s): INR in the last 168 hours.  Urinalysis    Component Value Date/Time   COLORURINE YELLOW 04/07/2015 2350   APPEARANCEUR CLOUDY (A)  04/07/2015 2350   LABSPEC 1.020 04/07/2015 2350   PHURINE 5.0 04/07/2015 2350   GLUCOSEU >1000 (A) 04/07/2015 2350   HGBUR NEGATIVE 04/07/2015 2350   BILIRUBINUR NEGATIVE 04/07/2015 2350   KETONESUR NEGATIVE 04/07/2015 2350   PROTEINUR 30 (A) 04/07/2015 2350   UROBILINOGEN 0.2 09/06/2013 1040   NITRITE NEGATIVE 04/07/2015 2350   LEUKOCYTESUR NEGATIVE 04/07/2015 2350    STUDIES: Mammography was due last month  ASSESSMENT: 62 y.o.  woman   (1) status post right breast and right axillary lymph node biopsy 01/26/2013,  for a clinical T1c N1, stage IIA invasive ductal carcinoma, grade 3, estrogen and progesterone receptor positive, HER-2 amplified by immunohistochemistry (3+), with an MIB-1 of 70%.  (2) treated neo-adjuvantly with 4 of 6 planned cycles of docetaxel, carboplatin, trastuzumab,and pertuzumab, last dose 04/27/2013   (a) final 2 cycles of chemotherapy omitted in the face of multiple renal and electrolyte abnormalities    (b) pertuzumab and trastuzumab continued until 04/27/2013  (c) trastuzumab alone continued to complete one year of anti-HER-2 treatment, last dose 04/12/2014  (d) echocardiogram 03/31/14 showing an ejection fraction in the 60-65% range  (3) status post right lumpectomy and sentinel lymph node sampling 07/19/2013 showing a complete pathologic response (ypT0 ypN0)  (4) adjuvant radiation to the breast completed on 11/29/13   (a) participating in RTOG 1304/ B-51, randomized to no regional nodal XRT  (5) began anastrozole daily on 12/22/13, discontinued February 2018 with arthralgias and myalgias.  (  a) DEXA scan 01/17/2014 showed a T score of -0.3 (normal).  PLAN: Stellarose is now 4-1/2 years out from definitive surgery for her breast cancer with no evidence of disease recurrence.  This is very favorable.  As discussed previously, she was unable to tolerate aromatase inhibitors and is not a candidate for tamoxifen given the risk of clotting.  At this  point I feel comfortable releasing her to her primary care physician.  All she will need in terms of breast cancer follow-up is her yearly mammogram and a yearly physician breast exam  I will be glad to see Tomi Bamberger again at any point in the future if and when the need arises but as of now are making no further routine appointments for her here.Jana Hakim, Virgie Dad, MD  06/30/17 2:16 PM Medical Oncology and Hematology [facility] [address] [place], [place] [address] Tel. [phone]    Fax. [phone]  Alice Rieger, am acting as scribe for Chauncey Cruel MD.  See med

## 2017-06-30 ENCOUNTER — Inpatient Hospital Stay (HOSPITAL_BASED_OUTPATIENT_CLINIC_OR_DEPARTMENT_OTHER): Payer: Medicare Other | Admitting: Oncology

## 2017-06-30 ENCOUNTER — Inpatient Hospital Stay: Payer: Medicare Other | Attending: Oncology

## 2017-06-30 ENCOUNTER — Encounter: Payer: Self-pay | Admitting: Oncology

## 2017-06-30 VITALS — BP 158/78 | HR 89 | Temp 98.7°F | Resp 18 | Ht 65.0 in | Wt 306.9 lb

## 2017-06-30 DIAGNOSIS — M1711 Unilateral primary osteoarthritis, right knee: Secondary | ICD-10-CM

## 2017-06-30 DIAGNOSIS — Z87891 Personal history of nicotine dependence: Secondary | ICD-10-CM

## 2017-06-30 DIAGNOSIS — M1712 Unilateral primary osteoarthritis, left knee: Secondary | ICD-10-CM | POA: Diagnosis not present

## 2017-06-30 DIAGNOSIS — Z170 Estrogen receptor positive status [ER+]: Secondary | ICD-10-CM | POA: Insufficient documentation

## 2017-06-30 DIAGNOSIS — C773 Secondary and unspecified malignant neoplasm of axilla and upper limb lymph nodes: Secondary | ICD-10-CM | POA: Insufficient documentation

## 2017-06-30 DIAGNOSIS — C50411 Malignant neoplasm of upper-outer quadrant of right female breast: Secondary | ICD-10-CM

## 2017-06-30 LAB — CBC WITH DIFFERENTIAL/PLATELET
BASOS ABS: 0 10*3/uL (ref 0.0–0.1)
Basophils Relative: 1 %
EOS PCT: 3 %
Eosinophils Absolute: 0.1 10*3/uL (ref 0.0–0.5)
HCT: 37.2 % (ref 34.8–46.6)
Hemoglobin: 12.2 g/dL (ref 11.6–15.9)
LYMPHS PCT: 28 %
Lymphs Abs: 1.4 10*3/uL (ref 0.9–3.3)
MCH: 27.7 pg (ref 25.1–34.0)
MCHC: 32.8 g/dL (ref 31.5–36.0)
MCV: 84.5 fL (ref 79.5–101.0)
Monocytes Absolute: 0.4 10*3/uL (ref 0.1–0.9)
Monocytes Relative: 8 %
NEUTROS ABS: 3 10*3/uL (ref 1.5–6.5)
NEUTROS PCT: 60 %
PLATELETS: 242 10*3/uL (ref 145–400)
RBC: 4.4 MIL/uL (ref 3.70–5.45)
RDW: 17.1 % — ABNORMAL HIGH (ref 11.2–14.5)
WBC: 5 10*3/uL (ref 3.9–10.3)

## 2017-06-30 LAB — COMPREHENSIVE METABOLIC PANEL
ALT: 12 U/L (ref 0–55)
ANION GAP: 8 (ref 3–11)
AST: 22 U/L (ref 5–34)
Albumin: 4 g/dL (ref 3.5–5.0)
Alkaline Phosphatase: 90 U/L (ref 40–150)
BILIRUBIN TOTAL: 0.3 mg/dL (ref 0.2–1.2)
BUN: 12 mg/dL (ref 7–26)
CO2: 29 mmol/L (ref 22–29)
Calcium: 9.1 mg/dL (ref 8.4–10.4)
Chloride: 107 mmol/L (ref 98–109)
Creatinine, Ser: 1.28 mg/dL — ABNORMAL HIGH (ref 0.60–1.10)
GFR, EST AFRICAN AMERICAN: 51 mL/min — AB (ref >60)
GFR, EST NON AFRICAN AMERICAN: 44 mL/min — AB (ref >60)
Glucose, Bld: 130 mg/dL (ref 70–140)
POTASSIUM: 4 mmol/L (ref 3.5–5.1)
Sodium: 144 mmol/L (ref 136–145)
TOTAL PROTEIN: 8 g/dL (ref 6.4–8.3)

## 2017-07-01 ENCOUNTER — Telehealth: Payer: Self-pay | Admitting: Oncology

## 2017-07-01 NOTE — Telephone Encounter (Signed)
Per 6/18 no los °

## 2017-07-17 ENCOUNTER — Other Ambulatory Visit: Payer: Self-pay | Admitting: Oncology

## 2017-07-17 ENCOUNTER — Other Ambulatory Visit: Payer: Self-pay | Admitting: Family Medicine

## 2017-07-17 DIAGNOSIS — C50411 Malignant neoplasm of upper-outer quadrant of right female breast: Secondary | ICD-10-CM

## 2017-08-14 ENCOUNTER — Other Ambulatory Visit: Payer: Self-pay | Admitting: Family Medicine

## 2017-08-14 ENCOUNTER — Other Ambulatory Visit: Payer: Self-pay | Admitting: Oncology

## 2017-08-14 DIAGNOSIS — C50411 Malignant neoplasm of upper-outer quadrant of right female breast: Secondary | ICD-10-CM

## 2017-08-14 NOTE — Telephone Encounter (Signed)
5 refills each 

## 2017-08-27 ENCOUNTER — Other Ambulatory Visit: Payer: Self-pay | Admitting: Family Medicine

## 2017-11-03 ENCOUNTER — Other Ambulatory Visit: Payer: Self-pay | Admitting: Family Medicine

## 2017-11-03 ENCOUNTER — Telehealth: Payer: Self-pay | Admitting: Family Medicine

## 2017-11-03 NOTE — Telephone Encounter (Signed)
Contacted patient daughter. Pt daughter states that patient has had this since the summer but thought it was just a rash because it was itching. Daughter noticed it yesterday. Pt has been putting vasoline on areas. Pt daughter states the there are three small places that are open and looks to be like it is draining brown pus. The spots that are open have been leaving spot on pajamas. Pt is a diabetic; daughter works in Teacher, music and daughter states that if we can put in an order that she could swab it and drop it off at lab or take it to work at her office and get [employer] there to do it. Please advise. Thank you. (pt does not want provider to see bottom)

## 2017-11-03 NOTE — Telephone Encounter (Signed)
Pts daughter [name] (on [place]) calling in to see if Dr. Richardson Landry would order a culture so she could swab a sore on her moms bottom and take it to her work and have them run the culture. She has a sore on her bottom that is the size of a whole hand. She is complaining of it itching really bad. The daughter thinks it might be a yeast infection and that concerns her with her being a diabetic.   [name]'s CB# [phone].

## 2017-11-03 NOTE — Telephone Encounter (Signed)
When weeping usually a bacterial component  I usually don't culture these  I look at [facility] and treat according to appearance  Tell pt i've seen 50000 butts in my time, they all are pretty much the same to me  If absolutely declines to come in detoconazole cr bid for yeast componet and bactroban oint bid for bacteriaL COMPONET  If develops pain or fever will need to bs

## 2017-11-03 NOTE — Telephone Encounter (Signed)
Contacted daughter and she states that she will talk with mom when she gets home to see if she would come in for an appointment. Daughter will call back with what mom decides. Will send in medication if she absolutely declines to come in.

## 2017-11-13 ENCOUNTER — Telehealth: Payer: Self-pay | Admitting: Family Medicine

## 2017-11-13 MED ORDER — ALBUTEROL SULFATE HFA 108 (90 BASE) MCG/ACT IN AERS: 2.0000 | INHALATION_SPRAY | RESPIRATORY_TRACT | 3 refills | Status: DC | PRN

## 2017-11-13 NOTE — Addendum Note (Signed)
Addended by: Karle Barr on: 11/13/2017 02:43 PM   Modules accepted: Orders

## 2017-11-13 NOTE — Telephone Encounter (Signed)
Medication sent to the pharmacy.

## 2017-11-13 NOTE — Telephone Encounter (Signed)
Pharmacy requesting refill on Ventolin inhaler. Inhale 2 puffs by mouth every 4 hours as needed.

## 2017-11-13 NOTE — Telephone Encounter (Signed)
Ok plus 3 ref 

## 2017-11-26 ENCOUNTER — Other Ambulatory Visit: Payer: Self-pay | Admitting: Family Medicine

## 2017-12-03 ENCOUNTER — Ambulatory Visit: Payer: Medicare Other | Admitting: Family Medicine

## 2017-12-03 ENCOUNTER — Encounter: Payer: Self-pay | Admitting: Family Medicine

## 2017-12-03 VITALS — BP 136/90 | HR 80 | Temp 99.0°F | Ht 65.0 in | Wt 312.0 lb

## 2017-12-03 DIAGNOSIS — R21 Rash and other nonspecific skin eruption: Secondary | ICD-10-CM

## 2017-12-03 DIAGNOSIS — I10 Essential (primary) hypertension: Secondary | ICD-10-CM

## 2017-12-03 DIAGNOSIS — Z23 Encounter for immunization: Secondary | ICD-10-CM | POA: Diagnosis not present

## 2017-12-03 DIAGNOSIS — E785 Hyperlipidemia, unspecified: Secondary | ICD-10-CM

## 2017-12-03 DIAGNOSIS — E039 Hypothyroidism, unspecified: Secondary | ICD-10-CM | POA: Diagnosis not present

## 2017-12-03 DIAGNOSIS — J4541 Moderate persistent asthma with (acute) exacerbation: Secondary | ICD-10-CM

## 2017-12-03 DIAGNOSIS — Z79899 Other long term (current) drug therapy: Secondary | ICD-10-CM | POA: Diagnosis not present

## 2017-12-03 MED ORDER — FLUTICASONE PROPIONATE HFA 44 MCG/ACT IN AERO: 2.0000 | INHALATION_SPRAY | Freq: Two times a day (BID) | RESPIRATORY_TRACT | 5 refills | Status: DC

## 2017-12-03 MED ORDER — LORATADINE 10 MG PO TABS: 10.0000 mg | ORAL_TABLET | Freq: Every day | ORAL | 5 refills | Status: DC

## 2017-12-03 MED ORDER — AMLODIPINE BESYLATE 10 MG PO TABS: ORAL_TABLET | ORAL | 1 refills | Status: DC

## 2017-12-03 MED ORDER — LEVOTHYROXINE SODIUM 88 MCG PO TABS: ORAL_TABLET | ORAL | 5 refills | Status: DC

## 2017-12-03 MED ORDER — TRIAMCINOLONE ACETONIDE 0.1 % EX CREA: 1.0000 "application " | TOPICAL_CREAM | Freq: Two times a day (BID) | CUTANEOUS | 2 refills | Status: DC

## 2017-12-03 MED ORDER — BENAZEPRIL HCL 40 MG PO TABS: 40.0000 mg | ORAL_TABLET | Freq: Every day | ORAL | 1 refills | Status: DC

## 2017-12-03 MED ORDER — FLUTICASONE PROPIONATE HFA 44 MCG/ACT IN AERO: 2.0000 | INHALATION_SPRAY | Freq: Two times a day (BID) | RESPIRATORY_TRACT | 12 refills | Status: DC

## 2017-12-03 MED ORDER — CARVEDILOL 12.5 MG PO TABS: ORAL_TABLET | ORAL | 1 refills | Status: DC

## 2017-12-03 MED ORDER — APIXABAN 5 MG PO TABS: ORAL_TABLET | ORAL | 5 refills | Status: DC

## 2017-12-03 MED ORDER — PRAVASTATIN SODIUM 80 MG PO TABS: ORAL_TABLET | ORAL | 5 refills | Status: DC

## 2017-12-03 NOTE — Progress Notes (Signed)
   Subjective:    Patient ID: [name], female    DOB: 1955/10/05, 62 y.o.   MRN: [id number] Patient arrives with a multitude of concerns Hyperlipidemia  This is a chronic problem. The current episode started more than 1 year ago. Treatments tried: pravastatin 80mg .   Wants flu vaccine today.  Itchy rash on buttock. Started about one year ago. Comes and goes. For a yr, off and on, clars up then comes back  Would like to get a stronger inhaler.  Had to do breathing treatments last week. Uses albuterol inhaler.   Using inhaler daily now, soetimes two or three times per day, taking two or three sprays per dose  One mo   Sees a diabetic specialist.   Patient continues to take lipid medication regularly. No obvious side effects from it. Generally does not miss a dose. Prior blood work results are reviewed with patient. Patient continues to work on fat intake in diet  Blood pressure medicine and blood pressure levels reviewed today with patient. Compliant with blood pressure medicine. States does not miss a dose. No obvious side effects. Blood pressure generally good when checked elsewhere. Watching salt intake.     Review of Systems No headache, no major weight loss or weight gain, no chest pain no back pain abdominal pain no change in bowel habits complete ROS otherwise negative     Objective:   Physical Exam  Alert and oriented, vitals reviewed and stable, NAD ENT-TM's and ext canals WNL bilat via otoscopic exam Soft palate, tonsils and post pharynx WNL via oropharyngeal exam Neck-symmetric, no masses; thyroid nonpalpable and nontender Pulmonary-no tachypnea or accessory muscle use; Clear without wheezes via auscultation Card--no abnrml murmurs, rhythm reg and rate WNL Carotid pulses symmetric, without bruits       Assessment & Plan:  Impression moderate persistent asthma.  Time for steroid inhaler.  Discussed at length.  Will initiate Flovent 44 2 puffs twice daily  rationale discussed  2.  Hypertension.  Good control discussed maintain same meds  3.  Hyperlipidemia prior blood work reviewed.  Compliance discussed meds refilled diet discussed  4.  Morbid obesity encouraged to lose weight  5.  Pruritic patchy itchy rash on buttocks.  Discussed.  Will initiate steroid cream twice daily to affected area  6.  Vaccine discussion held.  Patient needs not only flu shot but also pneumonia shot.  Discussed and initiated  7.  Hypothyroidism.  Control good on recent blood work to maintain same dose rationale discussed  Greater than 50% of this 40 minute face to face visit was spent in counseling and discussion and coordination of care regarding the above diagnosis/diagnosies

## 2017-12-04 LAB — HEPATIC FUNCTION PANEL
ALBUMIN: 4.8 g/dL (ref 3.6–4.8)
ALK PHOS: 85 IU/L (ref 39–117)
ALT: 12 IU/L (ref 0–32)
AST: 24 IU/L (ref 0–40)
BILIRUBIN TOTAL: 0.5 mg/dL (ref 0.0–1.2)
BILIRUBIN, DIRECT: 0.12 mg/dL (ref 0.00–0.40)
Total Protein: 8.1 g/dL (ref 6.0–8.5)

## 2017-12-04 LAB — LIPID PANEL
CHOL/HDL RATIO: 2.3 ratio (ref 0.0–4.4)
CHOLESTEROL TOTAL: 174 mg/dL (ref 100–199)
HDL: 75 mg/dL (ref >39)
LDL Calculated: 80 mg/dL (ref 0–99)
TRIGLYCERIDES: 93 mg/dL (ref 0–149)
VLDL Cholesterol Cal: 19 mg/dL (ref 5–40)

## 2017-12-06 ENCOUNTER — Encounter: Payer: Self-pay | Admitting: Family Medicine

## 2018-01-14 ENCOUNTER — Other Ambulatory Visit: Payer: Self-pay | Admitting: Family Medicine

## 2018-01-14 NOTE — Telephone Encounter (Signed)
Ok plus five ref 

## 2018-02-02 DIAGNOSIS — M791 Myalgia, unspecified site: Secondary | ICD-10-CM | POA: Diagnosis not present

## 2018-02-02 DIAGNOSIS — E1140 Type 2 diabetes mellitus with diabetic neuropathy, unspecified: Secondary | ICD-10-CM | POA: Diagnosis not present

## 2018-02-02 DIAGNOSIS — E890 Postprocedural hypothyroidism: Secondary | ICD-10-CM | POA: Diagnosis not present

## 2018-02-02 DIAGNOSIS — I10 Essential (primary) hypertension: Secondary | ICD-10-CM | POA: Diagnosis not present

## 2018-02-02 DIAGNOSIS — E1165 Type 2 diabetes mellitus with hyperglycemia: Secondary | ICD-10-CM | POA: Diagnosis not present

## 2018-02-02 DIAGNOSIS — E7800 Pure hypercholesterolemia, unspecified: Secondary | ICD-10-CM | POA: Diagnosis not present

## 2018-06-08 ENCOUNTER — Encounter: Payer: Self-pay | Admitting: Family Medicine

## 2018-06-08 ENCOUNTER — Other Ambulatory Visit: Payer: Self-pay

## 2018-06-08 ENCOUNTER — Ambulatory Visit (INDEPENDENT_AMBULATORY_CARE_PROVIDER_SITE_OTHER): Payer: Medicare Other | Admitting: Family Medicine

## 2018-06-08 DIAGNOSIS — I10 Essential (primary) hypertension: Secondary | ICD-10-CM

## 2018-06-08 DIAGNOSIS — E039 Hypothyroidism, unspecified: Secondary | ICD-10-CM | POA: Diagnosis not present

## 2018-06-08 DIAGNOSIS — E785 Hyperlipidemia, unspecified: Secondary | ICD-10-CM

## 2018-06-08 DIAGNOSIS — J4541 Moderate persistent asthma with (acute) exacerbation: Secondary | ICD-10-CM | POA: Diagnosis not present

## 2018-06-08 MED ORDER — PRAVASTATIN SODIUM 80 MG PO TABS: ORAL_TABLET | ORAL | 5 refills | Status: DC

## 2018-06-08 MED ORDER — BENAZEPRIL HCL 40 MG PO TABS: 40.0000 mg | ORAL_TABLET | Freq: Every day | ORAL | 1 refills | Status: DC

## 2018-06-08 MED ORDER — CARVEDILOL 12.5 MG PO TABS: ORAL_TABLET | ORAL | 1 refills | Status: DC

## 2018-06-08 MED ORDER — FLUTICASONE PROPIONATE HFA 44 MCG/ACT IN AERO: 2.0000 | INHALATION_SPRAY | Freq: Two times a day (BID) | RESPIRATORY_TRACT | 5 refills | Status: AC

## 2018-06-08 MED ORDER — APIXABAN 5 MG PO TABS: ORAL_TABLET | ORAL | 5 refills | Status: DC

## 2018-06-08 MED ORDER — AMLODIPINE BESYLATE 10 MG PO TABS: ORAL_TABLET | ORAL | 1 refills | Status: DC

## 2018-06-08 NOTE — Progress Notes (Signed)
   Subjective:    Patient ID: [name], female    DOB: 1955/07/29, 63 y.o.   MRN: [id number] Audio only Patient presents with numerous concerns  Hypertension  This is a chronic problem. The current episode started more than 1 year ago. Risk factors for coronary artery disease include dyslipidemia, diabetes mellitus, post-menopausal state and obesity. Treatments tried: norvasc, lotensin. There are no compliance problems.    Patient to check and see if it is ok for her to take joint support medication-OTC  Virtual Visit via Video Note  I connected with [name] on 06/08/18 at 10:00 AM EDT by a video enabled telemedicine application and verified that I am speaking with the correct person using two identifiers.  Location: Patient: home Provider: office    I discussed the limitations of evaluation and management by telemedicine and the availability of in person appointments. The patient expressed understanding and agreed to proceed.  History of Present Illness:    Observations/Objective:   Assessment and Plan:   Follow Up Instructions:    I discussed the assessment and treatment plan with the patient. The patient was provided an opportunity to ask questions and all were answered. The patient agreed with the plan and demonstrated an understanding of the instructions.   The patient was advised to call back or seek an in-person evaluation if the symptoms worsen or if the condition fails to improve as anticipated.  I provided25  minutes of non-face-to-face time during this encounter.    Blood pressure medicine and blood pressure levels reviewed today with patient. Compliant with blood pressure medicine. States does not miss a dose. No obvious side effects. Blood pressure generally good when checked elsewhere. Watching salt intake.   Patient continues to take lipid medication regularly. No obvious side effects from it. Generally does not miss a dose. Prior blood work  results are reviewed with patient. Patient continues to work on fat intake in diet  Asthma.  Is now clinically stable.  In fact patient stopped using her Flovent.  Rarely uses albuterol.  States this is doing fine.  thyroid Compliant with medication.  No symptoms of high or low thyroid.  Does not miss a dose.  Prior blood work reviewed  Review of Systems No headache, no major weight loss or weight gain, no chest pain no back pain abdominal pain no change in bowel habits complete ROS otherwise negative     Objective:   Physical Exam    Virtual visit    Assessment & Plan:  Impression hypertension very good control discussed maintain same meds  2.  Hyperlipidemia prior blood work reviewed patient maintain same approach  3.  Asthma clinically stable.  Patient maintain same treatment  4.  Type 2 diabetes followed by specialist  5.  Hypothyroidism.  Does not miss a dose.  No blood work at this time.  Follow-up in 6 months.  Diet exercise discussed and encouraged.  Medications refilled.

## 2018-06-23 ENCOUNTER — Other Ambulatory Visit: Payer: Self-pay | Admitting: *Deleted

## 2018-06-23 ENCOUNTER — Telehealth: Payer: Self-pay | Admitting: Family Medicine

## 2018-06-23 MED ORDER — DOXYCYCLINE HYCLATE 100 MG PO TABS: 100.0000 mg | ORAL_TABLET | Freq: Two times a day (BID) | ORAL | 0 refills | Status: DC

## 2018-06-23 NOTE — Telephone Encounter (Signed)
Left message to return call to get more infor

## 2018-06-23 NOTE — Telephone Encounter (Signed)
Doxy 100 bid ten d 

## 2018-06-23 NOTE — Telephone Encounter (Signed)
Patient has boil under her right arm and requesting a antibiotic called in to [facility]

## 2018-06-23 NOTE — Telephone Encounter (Signed)
Pt.notified

## 2018-06-23 NOTE — Telephone Encounter (Signed)
Boil under arm for about 4 days, a little painful, size of a quarter, no drainage, no fever. Has had boils in the past. Last one she had her diabetic specialist sent her in rx.   [place]

## 2018-06-23 NOTE — Telephone Encounter (Signed)
Med sent to pharm 

## 2018-07-06 DIAGNOSIS — G4733 Obstructive sleep apnea (adult) (pediatric): Secondary | ICD-10-CM | POA: Diagnosis not present

## 2018-07-15 ENCOUNTER — Other Ambulatory Visit: Payer: Self-pay | Admitting: Family Medicine

## 2018-07-15 NOTE — Telephone Encounter (Signed)
Ok plus one ref 

## 2018-07-23 ENCOUNTER — Other Ambulatory Visit: Payer: Self-pay

## 2018-07-23 MED ORDER — PRAVASTATIN SODIUM 80 MG PO TABS: ORAL_TABLET | ORAL | 1 refills | Status: DC

## 2018-08-14 ENCOUNTER — Other Ambulatory Visit: Payer: Self-pay | Admitting: Family Medicine

## 2018-08-16 NOTE — Telephone Encounter (Signed)
Ok plus five monthly ref 

## 2018-08-23 DIAGNOSIS — E890 Postprocedural hypothyroidism: Secondary | ICD-10-CM | POA: Diagnosis not present

## 2018-08-23 DIAGNOSIS — E1165 Type 2 diabetes mellitus with hyperglycemia: Secondary | ICD-10-CM | POA: Diagnosis not present

## 2018-08-23 DIAGNOSIS — I10 Essential (primary) hypertension: Secondary | ICD-10-CM | POA: Diagnosis not present

## 2018-08-23 DIAGNOSIS — E7800 Pure hypercholesterolemia, unspecified: Secondary | ICD-10-CM | POA: Diagnosis not present

## 2018-08-24 DIAGNOSIS — E1165 Type 2 diabetes mellitus with hyperglycemia: Secondary | ICD-10-CM | POA: Diagnosis not present

## 2018-11-01 ENCOUNTER — Other Ambulatory Visit: Payer: Self-pay | Admitting: Family Medicine

## 2018-11-20 ENCOUNTER — Other Ambulatory Visit (INDEPENDENT_AMBULATORY_CARE_PROVIDER_SITE_OTHER): Payer: Medicare Other | Admitting: *Deleted

## 2018-11-20 DIAGNOSIS — Z23 Encounter for immunization: Secondary | ICD-10-CM | POA: Diagnosis not present

## 2018-11-24 ENCOUNTER — Other Ambulatory Visit: Payer: Self-pay | Admitting: Family Medicine

## 2018-12-01 ENCOUNTER — Ambulatory Visit (INDEPENDENT_AMBULATORY_CARE_PROVIDER_SITE_OTHER): Payer: Medicare Other | Admitting: Family Medicine

## 2018-12-01 ENCOUNTER — Other Ambulatory Visit: Payer: Self-pay

## 2018-12-01 DIAGNOSIS — I10 Essential (primary) hypertension: Secondary | ICD-10-CM

## 2018-12-01 DIAGNOSIS — J4541 Moderate persistent asthma with (acute) exacerbation: Secondary | ICD-10-CM

## 2018-12-01 DIAGNOSIS — E785 Hyperlipidemia, unspecified: Secondary | ICD-10-CM

## 2018-12-01 MED ORDER — CARVEDILOL 12.5 MG PO TABS: ORAL_TABLET | ORAL | 1 refills | Status: DC

## 2018-12-01 MED ORDER — AMLODIPINE BESYLATE 10 MG PO TABS: ORAL_TABLET | ORAL | 1 refills | Status: DC

## 2018-12-01 MED ORDER — APIXABAN 5 MG PO TABS: ORAL_TABLET | ORAL | 5 refills | Status: DC

## 2018-12-01 MED ORDER — BENAZEPRIL HCL 40 MG PO TABS: 40.0000 mg | ORAL_TABLET | Freq: Every day | ORAL | 1 refills | Status: DC

## 2018-12-01 MED ORDER — PRAVASTATIN SODIUM 80 MG PO TABS: ORAL_TABLET | ORAL | 1 refills | Status: DC

## 2018-12-01 NOTE — Progress Notes (Signed)
   Subjective:  Audio only  Patient calls for multiple concerns  Patient ID: [name], female    DOB: 06/25/1955, 63 y.o.   MRN: [id number]  Hypertension This is a chronic problem. There are no compliance problems.   Pt states she checked her BP on Monday and it was 142/74. Pt has no issues or concerns.   Virtual Visit via Telephone Note  I connected with [name] on 12/01/18 at 11:00 AM EST by telephone and verified that I am speaking with the correct person using two identifiers.  Location: Patient: home Provider: office   I discussed the limitations, risks, security and privacy concerns of performing an evaluation and management service by telephone and the availability of in person appointments. I also discussed with the patient that there may be a patient responsible charge related to this service. The patient expressed understanding and agreed to proceed.   History of Present Illness:    Observations/Objective:   Assessment and Plan:   Follow Up Instructions:    I discussed the assessment and treatment plan with the patient. The patient was provided an opportunity to ask questions and all were answered. The patient agreed with the plan and demonstrated an understanding of the instructions.   The patient was advised to call back or seek an in-person evaluation if the symptoms worsen or if the condition fails to improve as anticipated. 25 inutes of non-face-to-face time during this encounter.   Vicente Males, LPN  Blood pressure medicine and blood pressure levels reviewed today with patient. Compliant with blood pressure medicine. States does not miss a dose. No obvious side effects. Blood pressure generally good when checked elsewhere. Watching salt intake.  Patient states overall asthma is in decent control.  She is sticking with her preventive inhaler pretty faithfully.  Rare use of albuterol worse recently since some smoke exposure.  Patient  continues to take lipid medication regularly. No obvious side effects from it. Generally does not miss a dose. Prior blood work results are reviewed with patient. Patient continues to work on fat intake in diet  Patient due to have blood work seen with endocrinologist.  They have now assumed care for her hypothyroidism in addition to diabetes.   Review of Systems No headache, no major weight loss or weight gain, no chest pain no back pain abdominal pain no change in bowel habits complete ROS otherwise negative     Objective:   Physical Exam   Virtual     Assessment & Plan:  Impression hypertension.  Blood pressure good were checked elsewhere by discussed medication refill  2.  Asthma overall clinically stable.  Has already had flu shot.  Inhalers are refilled  3.  Hyperlipidemia.  Status uncertain.  Encouraged to get blood work lipid plus liver panel.  Patient to do with endocrinologist  4.  Stress discussed.  Somewhat increased with patient and potential exposures avoidance measures discussed  Follow-up in 6 months flu shot already given

## 2018-12-02 ENCOUNTER — Other Ambulatory Visit: Payer: Self-pay | Admitting: Nurse Practitioner

## 2018-12-16 DIAGNOSIS — E890 Postprocedural hypothyroidism: Secondary | ICD-10-CM | POA: Diagnosis not present

## 2018-12-16 DIAGNOSIS — I10 Essential (primary) hypertension: Secondary | ICD-10-CM | POA: Diagnosis not present

## 2018-12-16 DIAGNOSIS — E7800 Pure hypercholesterolemia, unspecified: Secondary | ICD-10-CM | POA: Diagnosis not present

## 2019-01-28 ENCOUNTER — Other Ambulatory Visit: Payer: Self-pay | Admitting: Family Medicine

## 2019-01-31 NOTE — Telephone Encounter (Signed)
Last med check 12/01/18

## 2019-02-10 ENCOUNTER — Other Ambulatory Visit: Payer: Self-pay | Admitting: Family Medicine

## 2019-02-10 NOTE — Telephone Encounter (Signed)
Ok plus 5 monthly ref 

## 2019-02-10 NOTE — Telephone Encounter (Signed)
Med check up 12/01/18

## 2019-02-17 ENCOUNTER — Encounter: Payer: Self-pay | Admitting: Family Medicine

## 2019-02-21 ENCOUNTER — Other Ambulatory Visit: Payer: Self-pay | Admitting: Family Medicine

## 2019-04-13 ENCOUNTER — Other Ambulatory Visit: Payer: Self-pay | Admitting: Family Medicine

## 2019-05-23 ENCOUNTER — Other Ambulatory Visit: Payer: Self-pay | Admitting: Family Medicine

## 2019-05-23 DIAGNOSIS — I10 Essential (primary) hypertension: Secondary | ICD-10-CM

## 2019-05-23 DIAGNOSIS — Z79899 Other long term (current) drug therapy: Secondary | ICD-10-CM

## 2019-05-23 DIAGNOSIS — E785 Hyperlipidemia, unspecified: Secondary | ICD-10-CM

## 2019-05-23 DIAGNOSIS — E039 Hypothyroidism, unspecified: Secondary | ICD-10-CM

## 2019-05-23 NOTE — Telephone Encounter (Signed)
lvm to schedule appt.  

## 2019-05-23 NOTE — Telephone Encounter (Signed)
Scheduled 5/27.   Pt would like lab work ordered so she can have that done on Monday.

## 2019-05-25 ENCOUNTER — Other Ambulatory Visit: Payer: Self-pay | Admitting: Family Medicine

## 2019-05-25 NOTE — Telephone Encounter (Signed)
11/21/18 was last check up

## 2019-06-08 DIAGNOSIS — E785 Hyperlipidemia, unspecified: Secondary | ICD-10-CM | POA: Diagnosis not present

## 2019-06-08 DIAGNOSIS — E039 Hypothyroidism, unspecified: Secondary | ICD-10-CM | POA: Diagnosis not present

## 2019-06-08 DIAGNOSIS — I10 Essential (primary) hypertension: Secondary | ICD-10-CM | POA: Diagnosis not present

## 2019-06-08 DIAGNOSIS — Z79899 Other long term (current) drug therapy: Secondary | ICD-10-CM | POA: Diagnosis not present

## 2019-06-09 ENCOUNTER — Other Ambulatory Visit: Payer: Self-pay

## 2019-06-09 ENCOUNTER — Telehealth (INDEPENDENT_AMBULATORY_CARE_PROVIDER_SITE_OTHER): Payer: Medicare Other | Admitting: Family Medicine

## 2019-06-09 DIAGNOSIS — E785 Hyperlipidemia, unspecified: Secondary | ICD-10-CM

## 2019-06-09 DIAGNOSIS — J4541 Moderate persistent asthma with (acute) exacerbation: Secondary | ICD-10-CM

## 2019-06-09 DIAGNOSIS — I10 Essential (primary) hypertension: Secondary | ICD-10-CM | POA: Diagnosis not present

## 2019-06-09 LAB — LIPID PANEL
Chol/HDL Ratio: 2.3 ratio (ref 0.0–4.4)
Cholesterol, Total: 165 mg/dL (ref 100–199)
HDL: 71 mg/dL (ref >39)
LDL Chol Calc (NIH): 78 mg/dL (ref 0–99)
Triglycerides: 84 mg/dL (ref 0–149)
VLDL Cholesterol Cal: 16 mg/dL (ref 5–40)

## 2019-06-09 LAB — BASIC METABOLIC PANEL
BUN/Creatinine Ratio: 13 (ref 12–28)
BUN: 13 mg/dL (ref 8–27)
CO2: 23 mmol/L (ref 20–29)
Calcium: 10 mg/dL (ref 8.7–10.3)
Chloride: 101 mmol/L (ref 96–106)
Creatinine, Ser: 1.04 mg/dL — ABNORMAL HIGH (ref 0.57–1.00)
GFR calc Af Amer: 66 mL/min/{1.73_m2} (ref >59)
GFR calc non Af Amer: 57 mL/min/{1.73_m2} — ABNORMAL LOW (ref >59)
Glucose: 167 mg/dL — ABNORMAL HIGH (ref 65–99)
Potassium: 4.6 mmol/L (ref 3.5–5.2)
Sodium: 141 mmol/L (ref 134–144)

## 2019-06-09 LAB — HEPATIC FUNCTION PANEL
ALT: 17 IU/L (ref 0–32)
AST: 25 IU/L (ref 0–40)
Albumin: 4.6 g/dL (ref 3.8–4.8)
Alkaline Phosphatase: 89 IU/L (ref 48–121)
Bilirubin Total: 0.3 mg/dL (ref 0.0–1.2)
Bilirubin, Direct: 0.12 mg/dL (ref 0.00–0.40)
Total Protein: 8 g/dL (ref 6.0–8.5)

## 2019-06-09 LAB — TSH: TSH: 2.64 u[IU]/mL (ref 0.450–4.500)

## 2019-06-09 MED ORDER — ALBUTEROL SULFATE (2.5 MG/3ML) 0.083% IN NEBU: INHALATION_SOLUTION | RESPIRATORY_TRACT | 2 refills | Status: DC

## 2019-06-09 MED ORDER — ACCU-CHEK SMARTVIEW VI STRP: ORAL_STRIP | 0 refills | Status: DC

## 2019-06-09 MED ORDER — APIXABAN 5 MG PO TABS: ORAL_TABLET | ORAL | 5 refills | Status: DC

## 2019-06-09 MED ORDER — AMLODIPINE BESYLATE 10 MG PO TABS: ORAL_TABLET | ORAL | 1 refills | Status: DC

## 2019-06-09 MED ORDER — BENAZEPRIL HCL 40 MG PO TABS: ORAL_TABLET | ORAL | 1 refills | Status: DC

## 2019-06-09 MED ORDER — PRAVASTATIN SODIUM 40 MG PO TABS
40.0000 mg | ORAL_TABLET | Freq: Every day | ORAL | 1 refills | Status: DC
Start: 2019-06-09 — End: 2019-11-15

## 2019-06-09 NOTE — Progress Notes (Signed)
Subjective:  Audio only  Patient ID: [name], female    DOB: [birth year], 64 y.o.   MRN: [id number]  Hypertension This is a chronic problem. Condition status: todays Bp 134/76 HR 66. Treatments tried: amlodipine, benazapril and carvedilol. There are no compliance problems (eats twice a day to take medications, doesn't have much of an appetite.).   Hyperlipidemia This is a chronic problem. Treatments tried: pravastatin. Patient only takes 40mg  per day and would like to discuss changing rx.  There are no compliance problems.   Asthma This is a chronic problem. Her past medical history is significant for asthma.    Virtual Visit via Video Note  I connected with [name] on 06/09/19 at  8:40 AM EDT by a video enabled telemedicine application and verified that I am speaking with the correct person using two identifiers.  Location: Patient: home Provider: office   I discussed the limitations of evaluation and management by telemedicine and the availability of in person appointments. The patient expressed understanding and agreed to proceed.  History of Present Illness:    Observations/Objective:   Assessment and Plan:   Follow Up Instructions:    I discussed the assessment and treatment plan with the patient. The patient was provided an opportunity to ask questions and all were answered. The patient agreed with the plan and demonstrated an understanding of the instructions.   The patient was advised to call back or seek an in-person evaluation if the symptoms worsen or if the condition fails to improve as anticipated.  I provided 22 minutes of non-face-to-face time during this encounter. Results for orders placed or performed in visit on 05/23/19  Lipid panel  Result Value Ref Range   Cholesterol, Total 165 100 - 199 mg/dL   Triglycerides 84 0 - 149 mg/dL   HDL 71 >39 mg/dL   VLDL Cholesterol Cal 16 5 - 40 mg/dL   LDL Chol Calc (NIH) 78 0 - 99 mg/dL   Chol/HDL Ratio 2.3 0.0 - 4.4 ratio  Hepatic function panel  Result Value Ref Range   Total Protein 8.0 6.0 - 8.5 g/dL   Albumin 4.6 3.8 - 4.8 g/dL   Bilirubin Total 0.3 0.0 - 1.2 mg/dL   Bilirubin, Direct 0.12 0.00 - 0.40 mg/dL   Alkaline Phosphatase 89 48 - 121 IU/L   AST 25 0 - 40 IU/L   ALT 17 0 - 32 IU/L  Basic metabolic panel  Result Value Ref Range   Glucose 167 (H) 65 - 99 mg/dL   BUN 13 8 - 27 mg/dL   Creatinine, Ser 1.04 (H) 0.57 - 1.00 mg/dL   GFR calc non Af Amer 57 (L) >59 mL/min/1.73   GFR calc Af Amer 66 >59 mL/min/1.73   BUN/Creatinine Ratio 13 12 - 28   Sodium 141 134 - 144 mmol/L   Potassium 4.6 3.5 - 5.2 mmol/L   Chloride 101 96 - 106 mmol/L   CO2 23 20 - 29 mmol/L   Calcium 10.0 8.7 - 10.3 mg/dL  TSH  Result Value Ref Range   TSH 2.640 0.450 - 4.500 uIU/mL     Review of Systems No headache no chest pain no shortness of breath    Objective:   Physical Exam Virtual       Assessment & Plan:  Impression 1 hyperlipidemia.  Good control discussed maintain same meds  2.  Hypertension.  Blood pressure good when checked elsewhere to maintain same  3.  Asthma.  Clinically stable.  Medications refilled  Diet discussed exercise discussed blood work discussed follow-up in 6 months

## 2019-06-11 ENCOUNTER — Encounter: Payer: Self-pay | Admitting: Family Medicine

## 2019-06-17 ENCOUNTER — Other Ambulatory Visit: Payer: Self-pay | Admitting: Family Medicine

## 2019-06-19 ENCOUNTER — Other Ambulatory Visit: Payer: Self-pay | Admitting: Family Medicine

## 2019-06-20 ENCOUNTER — Other Ambulatory Visit: Payer: Self-pay | Admitting: Family Medicine

## 2019-06-20 ENCOUNTER — Telehealth: Payer: Self-pay | Admitting: Family Medicine

## 2019-06-20 MED ORDER — ALBUTEROL SULFATE HFA 108 (90 BASE) MCG/ACT IN AERS
2.0000 | INHALATION_SPRAY | Freq: Four times a day (QID) | RESPIRATORY_TRACT | 0 refills | Status: DC | PRN
Start: 2019-06-20 — End: 2019-11-01

## 2019-06-20 MED ORDER — ACCU-CHEK SMARTVIEW VI STRP: ORAL_STRIP | 1 refills | Status: DC

## 2019-06-20 NOTE — Addendum Note (Signed)
Addended by: Vicente Males on: 06/20/2019 11:58 AM   Modules accepted: Orders

## 2019-06-20 NOTE — Telephone Encounter (Signed)
Medication pended. Please advise. Thank you

## 2019-06-20 NOTE — Telephone Encounter (Signed)
Fax from [facility] [employer] [facility] requesting Accu-Check Smart Test Strips to be sent in electronically with Diagnosis Code. Pt last seen 06/09/19. Please advise. Thank you.

## 2019-06-20 NOTE — Telephone Encounter (Signed)
Please advise. Thank you

## 2019-06-20 NOTE — Telephone Encounter (Signed)
Pt would like refills sent to [facility].please advise. Thank you. Pt also has patient call.

## 2019-06-20 NOTE — Telephone Encounter (Signed)
Patient has been out of her albuterol inhaler for a few days. She thought it was being called in on 5/27 when she had her appt with Dr. Richardson Landry.  [facility] had requested it previously but she wants it to go to [facility] instead.  Patient is out and really needs asap.

## 2019-08-08 DIAGNOSIS — R609 Edema, unspecified: Secondary | ICD-10-CM | POA: Diagnosis not present

## 2019-08-08 DIAGNOSIS — E1151 Type 2 diabetes mellitus with diabetic peripheral angiopathy without gangrene: Secondary | ICD-10-CM | POA: Diagnosis not present

## 2019-08-08 DIAGNOSIS — B351 Tinea unguium: Secondary | ICD-10-CM | POA: Diagnosis not present

## 2019-08-10 DIAGNOSIS — E1165 Type 2 diabetes mellitus with hyperglycemia: Secondary | ICD-10-CM | POA: Diagnosis not present

## 2019-08-10 DIAGNOSIS — I10 Essential (primary) hypertension: Secondary | ICD-10-CM | POA: Diagnosis not present

## 2019-08-10 DIAGNOSIS — E7800 Pure hypercholesterolemia, unspecified: Secondary | ICD-10-CM | POA: Diagnosis not present

## 2019-08-10 DIAGNOSIS — E890 Postprocedural hypothyroidism: Secondary | ICD-10-CM | POA: Diagnosis not present

## 2019-08-15 ENCOUNTER — Telehealth: Payer: Self-pay | Admitting: Family Medicine

## 2019-08-15 ENCOUNTER — Other Ambulatory Visit: Payer: Self-pay | Admitting: Family Medicine

## 2019-08-15 MED ORDER — TRAMADOL HCL 50 MG PO TABS: ORAL_TABLET | ORAL | 1 refills | Status: DC

## 2019-08-15 NOTE — Telephone Encounter (Signed)
Prescription was sent in.

## 2019-08-15 NOTE — Telephone Encounter (Signed)
May do this and 1 refill please pend, will need to sched f/u ov with dr Lovena Le

## 2019-08-15 NOTE — Telephone Encounter (Signed)
Patient is requesting refill on tramadol 50 mg she was last seen 11//8/20 for office visit. [place]

## 2019-08-16 NOTE — Telephone Encounter (Signed)
Patient notified

## 2019-10-07 DIAGNOSIS — G4733 Obstructive sleep apnea (adult) (pediatric): Secondary | ICD-10-CM | POA: Diagnosis not present

## 2019-10-17 DIAGNOSIS — R609 Edema, unspecified: Secondary | ICD-10-CM | POA: Diagnosis not present

## 2019-10-17 DIAGNOSIS — E1151 Type 2 diabetes mellitus with diabetic peripheral angiopathy without gangrene: Secondary | ICD-10-CM | POA: Diagnosis not present

## 2019-10-17 DIAGNOSIS — B351 Tinea unguium: Secondary | ICD-10-CM | POA: Diagnosis not present

## 2019-10-21 ENCOUNTER — Other Ambulatory Visit: Payer: Self-pay | Admitting: Family Medicine

## 2019-10-24 NOTE — Telephone Encounter (Signed)
Patient has appointment for 11/2

## 2019-10-31 ENCOUNTER — Other Ambulatory Visit: Payer: Self-pay | Admitting: Family Medicine

## 2019-11-15 ENCOUNTER — Encounter: Payer: Self-pay | Admitting: Family Medicine

## 2019-11-15 ENCOUNTER — Ambulatory Visit (INDEPENDENT_AMBULATORY_CARE_PROVIDER_SITE_OTHER): Payer: Medicare Other | Admitting: Family Medicine

## 2019-11-15 VITALS — BP 136/82 | HR 81 | Temp 97.9°F

## 2019-11-15 DIAGNOSIS — E119 Type 2 diabetes mellitus without complications: Secondary | ICD-10-CM

## 2019-11-15 DIAGNOSIS — I10 Essential (primary) hypertension: Secondary | ICD-10-CM

## 2019-11-15 DIAGNOSIS — Z23 Encounter for immunization: Secondary | ICD-10-CM | POA: Diagnosis not present

## 2019-11-15 DIAGNOSIS — I4892 Unspecified atrial flutter: Secondary | ICD-10-CM

## 2019-11-15 DIAGNOSIS — E785 Hyperlipidemia, unspecified: Secondary | ICD-10-CM | POA: Diagnosis not present

## 2019-11-15 DIAGNOSIS — N182 Chronic kidney disease, stage 2 (mild): Secondary | ICD-10-CM

## 2019-11-15 DIAGNOSIS — J45909 Unspecified asthma, uncomplicated: Secondary | ICD-10-CM

## 2019-11-15 DIAGNOSIS — M129 Arthropathy, unspecified: Secondary | ICD-10-CM

## 2019-11-15 DIAGNOSIS — Z794 Long term (current) use of insulin: Secondary | ICD-10-CM

## 2019-11-15 MED ORDER — CARVEDILOL 12.5 MG PO TABS: ORAL_TABLET | ORAL | 0 refills | Status: DC

## 2019-11-15 MED ORDER — TRAMADOL HCL 50 MG PO TABS: ORAL_TABLET | ORAL | 2 refills | Status: DC

## 2019-11-15 MED ORDER — PRAVASTATIN SODIUM 40 MG PO TABS: 40.0000 mg | ORAL_TABLET | Freq: Every day | ORAL | 1 refills | Status: DC

## 2019-11-15 MED ORDER — BENAZEPRIL HCL 40 MG PO TABS: ORAL_TABLET | ORAL | 1 refills | Status: DC

## 2019-11-15 MED ORDER — ALBUTEROL SULFATE HFA 108 (90 BASE) MCG/ACT IN AERS: INHALATION_SPRAY | RESPIRATORY_TRACT | 2 refills | Status: DC

## 2019-11-15 MED ORDER — APIXABAN 5 MG PO TABS: ORAL_TABLET | ORAL | 5 refills | Status: DC

## 2019-11-15 MED ORDER — AMLODIPINE BESYLATE 10 MG PO TABS: ORAL_TABLET | ORAL | 1 refills | Status: DC

## 2019-11-15 NOTE — Progress Notes (Signed)
Patient ID: [name], female    DOB: 30-Apr-1955, 64 y.o.   MRN: [id number]   Chief Complaint  Patient presents with  . Hypertension  . Hyperlipidemia  . Asthma   Subjective:    HPI  Cc- f/u htn, hld, asthma-  Seeing endo, for DM2.  Dr. Debbora Presto. dm2- pt is on insulin 75/25.  Seeing podiatry- stable.  No neurpathy per pt. Eye exam- not seen in past year.  HTN- doing well with meds.  No SE.  Denies chest pain, sob, leg swelling, ha or dizziness.  H/o a flutter- taking eliquis and bp meds. Not seeing cards. Had to have carioversion in past Then was released from cardiology. No bleeding issues.  Staying well, not having covid.  ashtma doing ok and seasons changing has some asthma issues.  Needing inhaler more.  Using neb machine prn and rescure inhaler.  Needing refill on tramadol.  Taking pain in knees and hands.  Taking one daily and helps with pain. Using rolling seated walker to ambulate.  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  More swollen on left lower leg than right. Pt doesn't know how it's unable to bend.  Had surgery from Dr. Tommie Raymond. Last xray of left knee- 2008.   Lots of complications of the knee.  hypothyroid disease- had surgery to remove thyroid. Seeing endo.  Medical History [name] has a past medical history of Asthma, Breast cancer ([place]) (01/2013), Dehydration (04/13/2013), GERD (gastroesophageal reflux disease), Graves' disease, Hyperlipidemia, Hypertension, Non-insulin dependent type 2 diabetes mellitus ([place]), Obesity, Ophthalmic manifestation of Graves disease, Osteoarthritis (2003), Radiation (10/11/13-11/29/13), Sleep apnea (4/16), and Wears glasses.   Outpatient Encounter Medications as of 11/15/2019  Medication Sig  . loratadine (CLARITIN) 10 MG tablet Take 1 tablet (10 mg total) by mouth daily.  Marland Kitchen acetaminophen (TYLENOL) 325 MG tablet Take 2 tablets (650 mg total) by mouth every 6 (six) hours as needed for mild pain or headache.   . albuterol (PROVENTIL) (2.5 MG/3ML) 0.083% nebulizer solution USE 1 VIAL IN NEBULIZER EVERY 6 HOURS AS NEEDED FOR WHEEZING OR SHORTNESS OF BREATH  . albuterol (VENTOLIN HFA) 108 (90 Base) MCG/ACT inhaler INHALE 2 PUFFS INTO THE LUNGS EVERY 6 HOURS AS NEEDED FOR SHORTNESS OF BREATH OR WHEEZING.  Marland Kitchen amLODipine (NORVASC) 10 MG tablet TAKE (1) TABLET BY MOUTH ONCE DAILY.  Marland Kitchen apixaban (ELIQUIS) 5 MG TABS tablet TAKE (1) TABLET BY MOUTH TWICE DAILY.  Marland Kitchen Artificial Tear Ointment (ARTIFICIAL TEARS) ointment Place 1 drop into both eyes as needed (for grey eye disease).   . benazepril (LOTENSIN) 40 MG tablet TAKE (1) TABLET BY MOUTH ONCE DAILY.  Marland Kitchen Biotin 1000 MCG tablet Take 1,000 mcg by mouth daily.  . carvedilol (COREG) 12.5 MG tablet Take 1 tab p.o. bid.  . fluticasone (FLOVENT HFA) 44 MCG/ACT inhaler Inhale 2 puffs into the lungs 2 (two) times daily.  Marland Kitchen gabapentin (NEURONTIN) 100 MG capsule TAKE 1 CAPSULE BY MOUTH THREE TIMES A DAY.  Marland Kitchen glucose blood (ACCU-CHEK SMARTVIEW) test strip USE 1 STRIP TO CHECK GLUCOSE 4 TIMES DAILY AS DIRECTED. DX E11.9  . HUMALOG MIX 75/25 KWIKPEN (75-25) 100 UNIT/ML Kwikpen   . Insulin Pen Needle (UNIFINE PENTIPS) 31G X 8 MM MISC Use to inject insulin one time daily  . levothyroxine (SYNTHROID) 100 MCG tablet Take 100 mcg by mouth daily before breakfast.  . pravastatin (PRAVACHOL) 40 MG tablet Take 1 tablet (40 mg total) by mouth daily.  . pregabalin (LYRICA) 75 MG capsule 3 (  three) times daily.   . traMADol (ULTRAM) 50 MG tablet TAKE 1 TABLET ONCE DAILY AS NEEDED FOR PAIN.  Marland Kitchen triamcinolone cream (KENALOG) 0.1 % APPLY TO AFFECTED AREAS TWICE DAILY.  . [DISCONTINUED] amLODipine (NORVASC) 10 MG tablet TAKE (1) TABLET BY MOUTH ONCE DAILY.  . [DISCONTINUED] apixaban (ELIQUIS) 5 MG TABS tablet TAKE (1) TABLET BY MOUTH TWICE DAILY.  . [DISCONTINUED] benazepril (LOTENSIN) 40 MG tablet TAKE (1) TABLET BY MOUTH ONCE DAILY.  . [DISCONTINUED] carvedilol (COREG) 12.5 MG tablet TAKE (1)  TABLET BY MOUTH TWICE DAILY.  . [DISCONTINUED] cholecalciferol (VITAMIN D) 1000 UNITS tablet Take 1,000 Units by mouth daily.  . [DISCONTINUED] pravastatin (PRAVACHOL) 40 MG tablet Take 1 tablet (40 mg total) by mouth daily.  . [DISCONTINUED] traMADol (ULTRAM) 50 MG tablet TAKE 1 TABLET ONCE DAILY AS NEEDED FOR PAIN. (NEED OFFICE VISIT)  . [DISCONTINUED] VENTOLIN HFA 108 (90 Base) MCG/ACT inhaler INHALE 2 PUFFS INTO THE LUNGS EVERY 6 HOURS AS NEEDED FOR SHORTNESS OF BREATH OR WHEEZING.  . [DISCONTINUED] methylPREDNISolone acetate (DEPO-MEDROL) injection 40 mg    No facility-administered encounter medications on file as of 11/15/2019.     Review of Systems  Constitutional: Negative for chills and fever.  HENT: Negative for congestion, rhinorrhea and sore throat.   Respiratory: Negative for cough, shortness of breath and wheezing.   Cardiovascular: Negative for chest pain and leg swelling.  Gastrointestinal: Negative for abdominal pain, diarrhea, nausea and vomiting.  Genitourinary: Negative for dysuria and frequency.  Musculoskeletal: Negative for arthralgias and back pain.       +bilateral hand and knee pain-chronic  Skin: Negative for rash.  Neurological: Negative for dizziness, weakness and headaches.     Vitals BP 136/82   Pulse 81   Temp 97.9 F (36.6 C)   SpO2 99%   Objective:   Physical Exam Vitals and nursing note reviewed.  Constitutional:      Appearance: Normal appearance.  HENT:     Head: Normocephalic and atraumatic.     Nose: Nose normal.     Mouth/Throat:     Mouth: Mucous membranes are moist.     Pharynx: Oropharynx is clear.  Eyes:     Extraocular Movements: Extraocular movements intact.     Conjunctiva/sclera: Conjunctivae normal.     Pupils: Pupils are equal, round, and reactive to light.  Cardiovascular:     Rate and Rhythm: Normal rate and regular rhythm.     Pulses: Normal pulses.     Heart sounds: Normal heart sounds. No murmur heard.    Pulmonary:     Effort: Pulmonary effort is normal.     Breath sounds: Normal breath sounds. No wheezing, rhonchi or rales.  Musculoskeletal:        General: Normal range of motion.     Right lower leg: No edema.     Left lower leg: No edema.  Skin:    General: Skin is warm and dry.     Findings: No lesion or rash.  Neurological:     General: No focal deficit present.     Mental Status: She is alert and oriented to person, place, and time.     Cranial Nerves: No cranial nerve deficit.  Psychiatric:        Mood and Affect: Mood normal.        Behavior: Behavior normal.      Assessment and Plan   1. Type 2 diabetes mellitus without complication, with long-term current use of insulin ([place])  2. Need for vaccination - Flu Vaccine QUAD High Dose(Fluad)  3. CKD (chronic kidney disease) stage 2, GFR 60-89 ml/min  4. Primary hypertension - amLODipine (NORVASC) 10 MG tablet; TAKE (1) TABLET BY MOUTH ONCE DAILY.  Dispense: 90 tablet; Refill: 1 - benazepril (LOTENSIN) 40 MG tablet; TAKE (1) TABLET BY MOUTH ONCE DAILY.  Dispense: 90 tablet; Refill: 1 - carvedilol (COREG) 12.5 MG tablet; Take 1 tab p.o. bid.  Dispense: 180 tablet; Refill: 0  5. Hyperlipidemia, unspecified hyperlipidemia type - pravastatin (PRAVACHOL) 40 MG tablet; Take 1 tablet (40 mg total) by mouth daily.  Dispense: 90 tablet; Refill: 1  6. Moderate asthma, unspecified whether complicated, unspecified whether persistent - albuterol (VENTOLIN HFA) 108 (90 Base) MCG/ACT inhaler; INHALE 2 PUFFS INTO THE LUNGS EVERY 6 HOURS AS NEEDED FOR SHORTNESS OF BREATH OR WHEEZING.  Dispense: 18 g; Refill: 2  7. Atrial flutter, unspecified type (HCC) - apixaban (ELIQUIS) 5 MG TABS tablet; TAKE (1) TABLET BY MOUTH TWICE DAILY.  Dispense: 60 tablet; Refill: 5  8. Arthritis, multiple joint involvement - traMADol (ULTRAM) 50 MG tablet; TAKE 1 TABLET ONCE DAILY AS NEEDED FOR PAIN.  Dispense: 60 tablet; Refill: 2   DM2- stable. Cont  meds.  Cont to see endo for labs.  CKD2- stable, cont meds and cont to monitor, last Cr 1.04.  htn- stable. Cont meds A.flutter- stable.  Cont meds. HLD- stable.  Cont meds.  OA- multiple joints- cont tramadol prn. Asthma- stable.  Cont meds.  Reviewed last labs 5/21.  F/u 72mo or prn.

## 2019-11-16 ENCOUNTER — Other Ambulatory Visit: Payer: Self-pay | Admitting: Family Medicine

## 2020-01-09 DIAGNOSIS — R609 Edema, unspecified: Secondary | ICD-10-CM | POA: Diagnosis not present

## 2020-01-09 DIAGNOSIS — B351 Tinea unguium: Secondary | ICD-10-CM | POA: Diagnosis not present

## 2020-01-09 DIAGNOSIS — E1151 Type 2 diabetes mellitus with diabetic peripheral angiopathy without gangrene: Secondary | ICD-10-CM | POA: Diagnosis not present

## 2020-02-16 ENCOUNTER — Other Ambulatory Visit: Payer: Self-pay | Admitting: Family Medicine

## 2020-02-16 DIAGNOSIS — E119 Type 2 diabetes mellitus without complications: Secondary | ICD-10-CM

## 2020-02-16 DIAGNOSIS — Z79899 Other long term (current) drug therapy: Secondary | ICD-10-CM

## 2020-02-16 DIAGNOSIS — I10 Essential (primary) hypertension: Secondary | ICD-10-CM

## 2020-02-16 DIAGNOSIS — E039 Hypothyroidism, unspecified: Secondary | ICD-10-CM

## 2020-02-16 DIAGNOSIS — E785 Hyperlipidemia, unspecified: Secondary | ICD-10-CM

## 2020-02-16 DIAGNOSIS — Z794 Long term (current) use of insulin: Secondary | ICD-10-CM

## 2020-02-16 NOTE — Telephone Encounter (Signed)
Seen 11/2019 for med check up.

## 2020-02-17 NOTE — Telephone Encounter (Signed)
pls order cbc, cmp, lipids, and a1c on next visit, to get 1 wk prior to visit. Thx. Dr. Lovena Le

## 2020-02-17 NOTE — Addendum Note (Signed)
Addended by: Carmelina Noun on: 02/17/2020 08:45 AM   Modules accepted: Orders

## 2020-02-24 DIAGNOSIS — I10 Essential (primary) hypertension: Secondary | ICD-10-CM | POA: Diagnosis not present

## 2020-02-24 DIAGNOSIS — E1165 Type 2 diabetes mellitus with hyperglycemia: Secondary | ICD-10-CM | POA: Diagnosis not present

## 2020-02-24 DIAGNOSIS — E7800 Pure hypercholesterolemia, unspecified: Secondary | ICD-10-CM | POA: Diagnosis not present

## 2020-02-24 DIAGNOSIS — E890 Postprocedural hypothyroidism: Secondary | ICD-10-CM | POA: Diagnosis not present

## 2020-04-11 ENCOUNTER — Other Ambulatory Visit: Payer: Self-pay | Admitting: Family Medicine

## 2020-05-07 DIAGNOSIS — R609 Edema, unspecified: Secondary | ICD-10-CM | POA: Diagnosis not present

## 2020-05-07 DIAGNOSIS — B351 Tinea unguium: Secondary | ICD-10-CM | POA: Diagnosis not present

## 2020-05-07 DIAGNOSIS — E1151 Type 2 diabetes mellitus with diabetic peripheral angiopathy without gangrene: Secondary | ICD-10-CM | POA: Diagnosis not present

## 2020-05-10 ENCOUNTER — Other Ambulatory Visit: Payer: Self-pay | Admitting: Family Medicine

## 2020-05-10 DIAGNOSIS — M129 Arthropathy, unspecified: Secondary | ICD-10-CM

## 2020-05-13 NOTE — Telephone Encounter (Signed)
Needs appt for more refills. Dr. Jyll Tomaro  

## 2020-05-14 NOTE — Telephone Encounter (Signed)
Appt 5/3

## 2020-05-15 ENCOUNTER — Ambulatory Visit (INDEPENDENT_AMBULATORY_CARE_PROVIDER_SITE_OTHER): Payer: Medicare Other | Admitting: Family Medicine

## 2020-05-15 ENCOUNTER — Encounter: Payer: Self-pay | Admitting: Family Medicine

## 2020-05-15 ENCOUNTER — Other Ambulatory Visit: Payer: Self-pay

## 2020-05-15 VITALS — BP 134/86 | Temp 97.3°F | Wt 292.0 lb

## 2020-05-15 DIAGNOSIS — E785 Hyperlipidemia, unspecified: Secondary | ICD-10-CM

## 2020-05-15 DIAGNOSIS — I10 Essential (primary) hypertension: Secondary | ICD-10-CM | POA: Diagnosis not present

## 2020-05-15 DIAGNOSIS — M129 Arthropathy, unspecified: Secondary | ICD-10-CM | POA: Diagnosis not present

## 2020-05-15 DIAGNOSIS — Z79899 Other long term (current) drug therapy: Secondary | ICD-10-CM | POA: Diagnosis not present

## 2020-05-15 DIAGNOSIS — E119 Type 2 diabetes mellitus without complications: Secondary | ICD-10-CM | POA: Diagnosis not present

## 2020-05-15 DIAGNOSIS — I4892 Unspecified atrial flutter: Secondary | ICD-10-CM | POA: Diagnosis not present

## 2020-05-15 DIAGNOSIS — Z794 Long term (current) use of insulin: Secondary | ICD-10-CM

## 2020-05-15 DIAGNOSIS — E1122 Type 2 diabetes mellitus with diabetic chronic kidney disease: Secondary | ICD-10-CM

## 2020-05-15 DIAGNOSIS — E039 Hypothyroidism, unspecified: Secondary | ICD-10-CM

## 2020-05-15 DIAGNOSIS — N182 Chronic kidney disease, stage 2 (mild): Secondary | ICD-10-CM

## 2020-05-15 MED ORDER — TRAMADOL HCL 50 MG PO TABS: ORAL_TABLET | ORAL | 2 refills | Status: DC

## 2020-05-15 MED ORDER — AMLODIPINE BESYLATE 10 MG PO TABS: ORAL_TABLET | ORAL | 1 refills | Status: DC

## 2020-05-15 MED ORDER — ELIQUIS 5 MG PO TABS: ORAL_TABLET | ORAL | 1 refills | Status: DC

## 2020-05-15 MED ORDER — CARVEDILOL 12.5 MG PO TABS: ORAL_TABLET | ORAL | 1 refills | Status: DC

## 2020-05-15 MED ORDER — BENAZEPRIL HCL 40 MG PO TABS: ORAL_TABLET | ORAL | 1 refills | Status: DC

## 2020-05-15 NOTE — Progress Notes (Signed)
Patient ID: [name], female    DOB: [birth year], 65 y.o.   MRN: [id number]   Chief Complaint  Patient presents with  . Hypertension  . Hyperlipidemia   Subjective:    HPI Pt here for 6 month follow up on DM2, htn, hld.  DM2- Pt is checking sugar BID. Pt is on new insulin Trulicity 9.93 mg takes every Tuesday. Pt does see foot doctor and eye doctor. Pt will get blood work today.   Pt used to see cards.  Has h/o A-flutter and pt had cardioversion. Pt stating is dismissed from regular visits.  Pt had breast cancer and had chemo and was seeing cards then due to concerns of the Atrial flutter. On eliquis- no bleeding or blood or black stools.   Taking tramadol for pain- 1 tab daily for arthritis pain in legs.  No seeing oncology anymore for breast cancer. Was given gabapentin for nerve pain in lower legs.  HM-Not wanting repeat colonoscopy. Not seeing gyn doctor for gyn exam. No h/o abnormal pap. Not had mammo in 2020, is overdue. Pt declining mammo referral. She will call. Eye exam- needing it, overdue.  Seeing podiatry for foot exam.  Normal right now. No neuropathy.  Medical History [name] has a past medical history of Asthma, Breast cancer ([place]) (01/2013), Dehydration (04/13/2013), GERD (gastroesophageal reflux disease), Graves' disease, Hyperlipidemia, Hypertension, Non-insulin dependent type 2 diabetes mellitus ([place]), Obesity, Ophthalmic manifestation of Graves disease, Osteoarthritis (2003), Radiation (10/11/13-11/29/13), Sleep apnea (4/16), and Wears glasses.   Outpatient Encounter Medications as of 05/15/2020  Medication Sig  . acetaminophen (TYLENOL) 325 MG tablet Take 2 tablets (650 mg total) by mouth every 6 (six) hours as needed for mild pain or headache.  . albuterol (PROVENTIL) (2.5 MG/3ML) 0.083% nebulizer solution USE 1 VIAL IN NEBULIZER EVERY 6 HOURS AS NEEDED FOR WHEEZING OR SHORTNESS OF BREATH  . albuterol (VENTOLIN HFA) 108 (90 Base) MCG/ACT inhaler  INHALE 2 PUFFS INTO THE LUNGS EVERY 6 HOURS AS NEEDED FOR SHORTNESS OF BREATH OR WHEEZING.  Marland Kitchen Artificial Tear Ointment (ARTIFICIAL TEARS) ointment Place 1 drop into both eyes as needed (for grey eye disease).  . Biotin 1000 MCG tablet Take 1,000 mcg by mouth daily.  . Dulaglutide (TRULICITY) 7.16 RC/7.8LF SOPN See admin instructions.  . fluticasone (FLOVENT HFA) 44 MCG/ACT inhaler Inhale 2 puffs into the lungs 2 (two) times daily.  Marland Kitchen gabapentin (NEURONTIN) 100 MG capsule TAKE 1 CAPSULE BY MOUTH THREE TIMES A DAY.  Marland Kitchen HUMALOG MIX 75/25 KWIKPEN (75-25) 100 UNIT/ML Kwikpen   . Insulin Pen Needle 31G X 8 MM MISC Use to inject insulin one time daily  . levothyroxine (SYNTHROID) 100 MCG tablet Take 100 mcg by mouth daily before breakfast.  . loratadine (CLARITIN) 10 MG tablet Take 1 tablet (10 mg total) by mouth daily.  Glory Rosebush ULTRA test strip USE TO TEST BLOOD SUGAR 4 TIMES DAILY AS DIRECTED.  Marland Kitchen pravastatin (PRAVACHOL) 40 MG tablet Take 1 tablet (40 mg total) by mouth daily.  Marland Kitchen triamcinolone cream (KENALOG) 0.1 % APPLY TO AFFECTED AREAS TWICE DAILY.  . [DISCONTINUED] amLODipine (NORVASC) 10 MG tablet TAKE (1) TABLET BY MOUTH ONCE DAILY.  . [DISCONTINUED] apixaban (ELIQUIS) 5 MG TABS tablet TAKE (1) TABLET BY MOUTH TWICE DAILY.  . [DISCONTINUED] benazepril (LOTENSIN) 40 MG tablet TAKE (1) TABLET BY MOUTH ONCE DAILY.  . [DISCONTINUED] carvedilol (COREG) 12.5 MG tablet TAKE (1) TABLET BY MOUTH TWICE DAILY.  . [DISCONTINUED] pregabalin (LYRICA) 75 MG capsule  3 (three) times daily.   . [DISCONTINUED] traMADol (ULTRAM) 50 MG tablet TAKE 1 TABLET ONCE DAILY AS NEEDED FOR PAIN. (NEED OFFICE VISIT)  . amLODipine (NORVASC) 10 MG tablet TAKE (1) TABLET BY MOUTH ONCE DAILY.  Marland Kitchen apixaban (ELIQUIS) 5 MG TABS tablet TAKE (1) TABLET BY MOUTH TWICE DAILY.  . benazepril (LOTENSIN) 40 MG tablet TAKE (1) TABLET BY MOUTH ONCE DAILY.  . carvedilol (COREG) 12.5 MG tablet TAKE (1) TABLET BY MOUTH TWICE DAILY.  .  traMADol (ULTRAM) 50 MG tablet TAKE 1 TABLET ONCE DAILY AS NEEDED FOR PAIN. (NEED OFFICE VISIT)   No facility-administered encounter medications on file as of 05/15/2020.     Review of Systems  Constitutional: Negative for chills and fever.  HENT: Negative for congestion, rhinorrhea and sore throat.   Respiratory: Negative for cough, shortness of breath and wheezing.   Cardiovascular: Negative for chest pain and leg swelling.  Gastrointestinal: Negative for abdominal pain, diarrhea, nausea and vomiting.  Genitourinary: Negative for dysuria and frequency.  Musculoskeletal: Negative for arthralgias and back pain.  Skin: Negative for rash.  Neurological: Negative for dizziness, weakness and headaches.     Vitals BP 134/86   Temp (!) 97.3 F (36.3 C)   Wt 292 lb (132.5 kg) Comment: pt unsteady on scale  BMI 48.59 kg/m   Objective:   Physical Exam Vitals and nursing note reviewed.  Constitutional:      Appearance: Normal appearance.  HENT:     Head: Normocephalic and atraumatic.     Nose: Nose normal.     Mouth/Throat:     Mouth: Mucous membranes are moist.     Pharynx: Oropharynx is clear.  Eyes:     Extraocular Movements: Extraocular movements intact.     Conjunctiva/sclera: Conjunctivae normal.     Pupils: Pupils are equal, round, and reactive to light.  Cardiovascular:     Rate and Rhythm: Normal rate and regular rhythm.     Pulses: Normal pulses.     Heart sounds: Normal heart sounds.  Pulmonary:     Effort: Pulmonary effort is normal.     Breath sounds: Normal breath sounds. No wheezing, rhonchi or rales.  Musculoskeletal:        General: Normal range of motion.     Right lower leg: No edema.     Left lower leg: No edema.  Skin:    General: Skin is warm and dry.     Findings: No lesion or rash.  Neurological:     General: No focal deficit present.     Mental Status: She is alert and oriented to person, place, and time.  Psychiatric:        Mood and Affect:  Mood normal.        Behavior: Behavior normal.      Assessment and Plan   1. Primary hypertension - amLODipine (NORVASC) 10 MG tablet; TAKE (1) TABLET BY MOUTH ONCE DAILY.  Dispense: 90 tablet; Refill: 1 - benazepril (LOTENSIN) 40 MG tablet; TAKE (1) TABLET BY MOUTH ONCE DAILY.  Dispense: 90 tablet; Refill: 1 - carvedilol (COREG) 12.5 MG tablet; TAKE (1) TABLET BY MOUTH TWICE DAILY.  Dispense: 180 tablet; Refill: 1  2. Atrial flutter, unspecified type (HCC) - apixaban (ELIQUIS) 5 MG TABS tablet; TAKE (1) TABLET BY MOUTH TWICE DAILY.  Dispense: 180 tablet; Refill: 1  3. Arthritis, multiple joint involvement - traMADol (ULTRAM) 50 MG tablet; TAKE 1 TABLET ONCE DAILY AS NEEDED FOR PAIN. (NEED OFFICE VISIT)  Dispense:  60 tablet; Refill: 2  4. Type 2 diabetes mellitus with chronic kidney disease, with long-term current use of insulin, unspecified CKD stage (HCC) - Dulaglutide (TRULICITY) 9.76 BH/4.1PF SOPN; See admin instructions.  5. CKD (chronic kidney disease) stage 2, GFR 60-89 ml/min  6. Hypothyroidism, unspecified type  7. Hyperlipidemia, unspecified hyperlipidemia type    Pt to get labs today.  Pt to get them later this week. Will call pt with labs.  Dm2- seeing endo- a1c was in 6's. Using [facility]. Pt to call for eye exam, overdue.  Hypothyroid- will check labs.   hld- ck labs. Cont meds.  ckd- stable, cont control of bp and diabetes.  H/o breast cancer- stable. Not needing f/u with oncology anymore.  H/o Aflutter- f/u with cards prn.  Cont with eliquis.   htn- stable. Cont meds.  Arthritis- stable. Cont tramadol.  HM- Pt needing to get mammogram.  Pt declining colonoscopy.   Return in about 6 months (around 11/15/2020).

## 2020-05-15 NOTE — Patient Instructions (Signed)
Needing mammogram and eye exam.

## 2020-05-16 LAB — COMPREHENSIVE METABOLIC PANEL
ALT: 18 IU/L (ref 0–32)
AST: 20 IU/L (ref 0–40)
Albumin/Globulin Ratio: 1.4 (ref 1.2–2.2)
Albumin: 4.5 g/dL (ref 3.8–4.8)
Alkaline Phosphatase: 77 IU/L (ref 44–121)
BUN/Creatinine Ratio: 12 (ref 12–28)
BUN: 12 mg/dL (ref 8–27)
Bilirubin Total: 0.4 mg/dL (ref 0.0–1.2)
CO2: 22 mmol/L (ref 20–29)
Calcium: 9.2 mg/dL (ref 8.7–10.3)
Chloride: 104 mmol/L (ref 96–106)
Creatinine, Ser: 0.98 mg/dL (ref 0.57–1.00)
Globulin, Total: 3.2 g/dL (ref 1.5–4.5)
Glucose: 160 mg/dL — ABNORMAL HIGH (ref 65–99)
Potassium: 4.1 mmol/L (ref 3.5–5.2)
Sodium: 147 mmol/L — ABNORMAL HIGH (ref 134–144)
Total Protein: 7.7 g/dL (ref 6.0–8.5)
eGFR: 64 mL/min/{1.73_m2} (ref >59)

## 2020-05-16 LAB — CBC WITH DIFFERENTIAL/PLATELET
Basophils Absolute: 0 10*3/uL (ref 0.0–0.2)
Basos: 1 %
EOS (ABSOLUTE): 0.1 10*3/uL (ref 0.0–0.4)
Eos: 2 %
Hematocrit: 39.3 % (ref 34.0–46.6)
Hemoglobin: 12.4 g/dL (ref 11.1–15.9)
Immature Grans (Abs): 0 10*3/uL (ref 0.0–0.1)
Immature Granulocytes: 1 %
Lymphocytes Absolute: 1.4 10*3/uL (ref 0.7–3.1)
Lymphs: 23 %
MCH: 28.1 pg (ref 26.6–33.0)
MCHC: 31.6 g/dL (ref 31.5–35.7)
MCV: 89 fL (ref 79–97)
Monocytes Absolute: 0.5 10*3/uL (ref 0.1–0.9)
Monocytes: 8 %
Neutrophils Absolute: 3.9 10*3/uL (ref 1.4–7.0)
Neutrophils: 65 %
Platelets: 240 10*3/uL (ref 150–450)
RBC: 4.42 x10E6/uL (ref 3.77–5.28)
RDW: 15.8 % — ABNORMAL HIGH (ref 11.7–15.4)
WBC: 5.9 10*3/uL (ref 3.4–10.8)

## 2020-05-16 LAB — LIPID PANEL
Chol/HDL Ratio: 2.4 ratio (ref 0.0–4.4)
Cholesterol, Total: 158 mg/dL (ref 100–199)
HDL: 67 mg/dL (ref >39)
LDL Chol Calc (NIH): 75 mg/dL (ref 0–99)
Triglycerides: 87 mg/dL (ref 0–149)
VLDL Cholesterol Cal: 16 mg/dL (ref 5–40)

## 2020-05-16 LAB — HEMOGLOBIN A1C
Est. average glucose Bld gHb Est-mCnc: 154 mg/dL
Hgb A1c MFr Bld: 7 % — ABNORMAL HIGH (ref 4.8–5.6)

## 2020-05-22 ENCOUNTER — Telehealth: Payer: Self-pay

## 2020-05-22 NOTE — Telephone Encounter (Signed)
Returned [employer]

## 2020-05-27 ENCOUNTER — Encounter: Payer: Self-pay | Admitting: Family Medicine

## 2020-06-18 ENCOUNTER — Other Ambulatory Visit: Payer: Self-pay | Admitting: Family Medicine

## 2020-07-17 ENCOUNTER — Other Ambulatory Visit: Payer: Self-pay | Admitting: Family Medicine

## 2020-07-17 DIAGNOSIS — I4892 Unspecified atrial flutter: Secondary | ICD-10-CM

## 2020-08-01 ENCOUNTER — Other Ambulatory Visit: Payer: Self-pay | Admitting: Family Medicine

## 2020-08-01 DIAGNOSIS — E785 Hyperlipidemia, unspecified: Secondary | ICD-10-CM

## 2020-08-23 DIAGNOSIS — E890 Postprocedural hypothyroidism: Secondary | ICD-10-CM | POA: Diagnosis not present

## 2020-08-23 DIAGNOSIS — E7800 Pure hypercholesterolemia, unspecified: Secondary | ICD-10-CM | POA: Diagnosis not present

## 2020-08-23 DIAGNOSIS — I10 Essential (primary) hypertension: Secondary | ICD-10-CM | POA: Diagnosis not present

## 2020-08-23 DIAGNOSIS — E1165 Type 2 diabetes mellitus with hyperglycemia: Secondary | ICD-10-CM | POA: Diagnosis not present

## 2020-10-31 ENCOUNTER — Other Ambulatory Visit: Payer: Self-pay

## 2020-10-31 ENCOUNTER — Encounter: Payer: Self-pay | Admitting: Family Medicine

## 2020-10-31 ENCOUNTER — Ambulatory Visit (INDEPENDENT_AMBULATORY_CARE_PROVIDER_SITE_OTHER): Payer: Medicare Other | Admitting: Family Medicine

## 2020-10-31 VITALS — BP 138/86 | HR 88 | Ht 65.0 in

## 2020-10-31 DIAGNOSIS — E785 Hyperlipidemia, unspecified: Secondary | ICD-10-CM

## 2020-10-31 DIAGNOSIS — M129 Arthropathy, unspecified: Secondary | ICD-10-CM | POA: Diagnosis not present

## 2020-10-31 DIAGNOSIS — Z23 Encounter for immunization: Secondary | ICD-10-CM | POA: Diagnosis not present

## 2020-10-31 DIAGNOSIS — I10 Essential (primary) hypertension: Secondary | ICD-10-CM | POA: Diagnosis not present

## 2020-10-31 DIAGNOSIS — Z0000 Encounter for general adult medical examination without abnormal findings: Secondary | ICD-10-CM | POA: Insufficient documentation

## 2020-10-31 MED ORDER — DEPEND UNDERWEAR X-LARGE MISC: 6 refills | Status: DC

## 2020-10-31 MED ORDER — TRAMADOL HCL 50 MG PO TABS: 50.0000 mg | ORAL_TABLET | Freq: Three times a day (TID) | ORAL | 1 refills | Status: DC | PRN

## 2020-10-31 MED ORDER — PRAVASTATIN SODIUM 40 MG PO TABS: ORAL_TABLET | ORAL | 1 refills | Status: DC

## 2020-10-31 MED ORDER — CARVEDILOL 12.5 MG PO TABS: ORAL_TABLET | ORAL | 1 refills | Status: DC

## 2020-10-31 NOTE — Patient Instructions (Addendum)
Get your mammogram please.  I have refilled your Tramadol. Use as directed.  I have also refilled the other 2 medications you requested.  Follow up in 6 months.  Take care  Dr. Lacinda Axon

## 2020-10-31 NOTE — Assessment & Plan Note (Signed)
Well controlled on Pravachol. Continue. Refilled today.

## 2020-10-31 NOTE — Assessment & Plan Note (Signed)
Advised importance of mammogram. Patient states that she will get this soon. Health maintenance section updated today.

## 2020-10-31 NOTE — Assessment & Plan Note (Signed)
Tramadol for pain. Patient would like to see Rheumatology.

## 2020-10-31 NOTE — Progress Notes (Signed)
Subjective:  Patient ID: [name], female    DOB: [birth year]  Age: 65 y.o. MRN: [id number]  CC: Chief Complaint  Patient presents with   Hypertension   HPI:  65 year old female presents for follow up.  Osteoarthritis Patient having significant pain - elbows, wrists, right knee, back. Currently on Tramadol 50 mg daily for pain. Needs refill today.  HTN Stable. Meds: Norvasc, Benazapril, Coreg. Doing well at this time. Needs refill on Coreg.  HLD Well controlled on Pravastatin. Needs refill today.  Health maintenance Overdue for several items. Needs flu shot and would like to get one. Needs mammogram (especially given history of breast cancer).  States that she will get an eye exam in the future. Declines Colonoscopy.  Patient Active Problem List   Diagnosis Date Noted   Arthritis, multiple joint involvement 10/31/2020   Healthcare maintenance 10/31/2020   Depression 05/24/2015   OSA (obstructive sleep apnea) 04/04/2014   GERD (gastroesophageal reflux disease) 12/29/2013   Mitral regurgitation 11/08/2013   Atrial flutter ([place]) 11/05/2013   CKD (chronic kidney disease) stage 2, GFR 60-89 ml/min 05/08/2013   Hypothyroidism 05/04/2013   Malignant neoplasm of upper-outer quadrant of right breast in female, estrogen receptor positive ([place]) 01/31/2013   Osteoarthritis    Asthma 12/08/2010   Obesity    Hyperlipidemia 10/16/2010   Hypertension    Diabetes mellitus, type 2 (HCC)    Tobacco abuse, in remission     Social Hx   Social History   Socioeconomic History   Marital status: Divorced    Spouse name: Not on file   Number of children: 2   Years of education: Not on file   Highest education level: Not on file  Occupational History   Occupation: Disability  Tobacco Use   Smoking status: Former    Packs/day: 0.25    Years: 20.00    Pack years: 5.00    Types: Cigarettes    Quit date: 02/10/1991    Years since quitting: 29.7   Smokeless tobacco:  Never  Substance and Sexual Activity   Alcohol use: No   Drug use: No   Sexual activity: Never    Birth control/protection: Surgical  Other Topics Concern   Not on file  Social History Narrative   Lives in [place]         Social Determinants of Health   Financial Resource Strain: Not on file  Food Insecurity: Not on file  Transportation Needs: Not on file  Physical Activity: Not on file  Stress: Not on file  Social Connections: Not on file    Review of Systems  Constitutional: Negative.   Musculoskeletal:  Positive for arthralgias.    Objective:  BP 138/86   Pulse 88   Ht 5\' 5"  (1.651 m)   SpO2 99%   BMI 48.59 kg/m   BP/Weight 10/31/2020 05/15/2020 99/08/3380  Systolic BP 505 397 673  Diastolic BP 86 86 82  Wt. (Lbs) - 292 -  BMI 48.59 48.59 -    Physical Exam Vitals and nursing note reviewed.  Constitutional:      General: She is not in acute distress.    Appearance: Normal appearance. She is obese.  HENT:     Head: Normocephalic and atraumatic.  Cardiovascular:     Rate and Rhythm: Normal rate and regular rhythm.  Pulmonary:     Effort: Pulmonary effort is normal.     Breath sounds: Normal breath sounds. No wheezing, rhonchi or rales.  Neurological:  Mental Status: She is alert.  Psychiatric:        Mood and Affect: Mood normal.        Behavior: Behavior normal.    Lab Results  Component Value Date   WBC 5.9 05/15/2020   HGB 12.4 05/15/2020   HCT 39.3 05/15/2020   PLT 240 05/15/2020   GLUCOSE 160 (H) 05/15/2020   CHOL 158 05/15/2020   TRIG 87 05/15/2020   HDL 67 05/15/2020   LDLCALC 75 05/15/2020   ALT 18 05/15/2020   AST 20 05/15/2020   NA 147 (H) 05/15/2020   K 4.1 05/15/2020   CL 104 05/15/2020   CREATININE 0.98 05/15/2020   BUN 12 05/15/2020   CO2 22 05/15/2020   TSH 2.640 06/08/2019   INR 1.29 09/07/2013   HGBA1C 7.0 (H) 05/15/2020     Assessment & Plan:   Problem List Items Addressed This Visit       Cardiovascular  and Mediastinum   Hypertension - Primary    Stable at this time. Continue current medications: Coreg (refilled today), Benazapril, Norvasc.      Relevant Medications   carvedilol (COREG) 12.5 MG tablet   pravastatin (PRAVACHOL) 40 MG tablet     Musculoskeletal and Integument   Arthritis, multiple joint involvement    Tramadol for pain. Patient would like to see Rheumatology.       Relevant Medications   traMADol (ULTRAM) 50 MG tablet   Other Relevant Orders   Ambulatory referral to Rheumatology     Other   Healthcare maintenance    Advised importance of mammogram. Patient states that she will get this soon. Health maintenance section updated today.      Hyperlipidemia    Well controlled on Pravachol. Continue. Refilled today.      Relevant Medications   carvedilol (COREG) 12.5 MG tablet   pravastatin (PRAVACHOL) 40 MG tablet   Other Visit Diagnoses     Need for vaccination       Relevant Orders   Flu Vaccine QUAD High Dose(Fluad) (Completed)       Meds ordered this encounter  Medications   carvedilol (COREG) 12.5 MG tablet    Sig: TAKE (1) TABLET BY MOUTH TWICE DAILY.    Dispense:  180 tablet    Refill:  1   pravastatin (PRAVACHOL) 40 MG tablet    Sig: TAKE (1) TABLET BY MOUTH AT BEDTIME.    Dispense:  90 tablet    Refill:  1   traMADol (ULTRAM) 50 MG tablet    Sig: Take 1 tablet (50 mg total) by mouth every 8 (eight) hours as needed for severe pain or moderate pain.    Dispense:  60 tablet    Refill:  1   Incontinence Supply Disposable (DEPEND UNDERWEAR X-LARGE) MISC    Sig: Use daily as needed.    Dispense:  26 each    Refill:  6    Follow-up:  Return in about 6 months (around 05/01/2021) for Follow up Chronic medical issues.  [place]

## 2020-10-31 NOTE — Assessment & Plan Note (Signed)
Stable at this time. Continue current medications: Coreg (refilled today), Benazapril, Norvasc.

## 2020-11-29 ENCOUNTER — Telehealth: Payer: Self-pay | Admitting: Family Medicine

## 2020-11-29 NOTE — Telephone Encounter (Signed)
  Left message for patient to call back and schedule Medicare Annual Wellness Visit (AWV) in office.   If unable to come into the office for AWV,  please offer to do virtually or by telephone.  No hx of AWV eligible for AWVI as of  03/14/2010 per [employer]  Please schedule at anytime with [facility]-Nurse Health Advisor.      40 Minutes appointment   Any questions, please call me at [phone]

## 2020-12-14 NOTE — Progress Notes (Signed)
Office Visit Note  Patient: [name]             Date of Birth: 06/11/1955           MRN: [id number]             PCP: Coral Spikes, DO Referring: Coral Spikes, DO Visit Date: 12/26/2020 Occupation: @GUAROCC @  Subjective:  Pain in multiple joints   History of Present Illness: [name] is a 65 y.o. female seen in consultation per request of her PCP.  According to the patient her symptoms a started many years ago with knee joint pain.  She had left total knee replacement in 2003 by Dr. Tommie Raymond.  She states after the surgery she could not bend her knee.  Over the years she started having increased pain in her right knee joint.  She was told that she had severe osteoarthritis but due to her previous experience she did not want to have right knee replacement.  She also has some difficulty getting in and out of the car due to her right hip joint discomfort.  She states in 2018 she was experiencing increased lower back pain at that time she had x-rays of her lower back which showed some degenerative changes.  She continues to have her lower back pain.  She has been experiencing pain in her bilateral hands and her left shoulder for the last 1 year.  She states she was given tramadol by her PCP.  She has been also using over-the-counter topical agents.  She continues to have pain and discomfort in her left shoulder joint.  She notices popping in her hands and also her right ankle joint.  None of the other joints are painful.  Her daughter has rheumatoid arthritis.  She is gravida 2, para 2, miscarriages 0.  No history of DVTs.  Activities of Daily Living:  Patient reports morning stiffness for all day. Patient Denies nocturnal pain.  Difficulty dressing/grooming: Reports Difficulty climbing stairs: Reports Difficulty getting out of chair: Reports Difficulty using hands for taps, buttons, cutlery, and/or writing: Denies  Review of Systems  Constitutional:  Negative for fatigue,  night sweats, weight gain and weight loss.  HENT:  Negative for mouth sores, trouble swallowing, trouble swallowing, mouth dryness and nose dryness.   Eyes:  Positive for dryness. Negative for pain, redness, itching and visual disturbance.  Respiratory:  Positive for shortness of breath and difficulty breathing. Negative for cough.   Cardiovascular:  Negative for chest pain, palpitations, hypertension, irregular heartbeat and swelling in legs/feet.  Gastrointestinal:  Negative for blood in stool, constipation and diarrhea.  Endocrine: Negative for increased urination.  Genitourinary:  Negative for difficulty urinating and vaginal dryness.  Musculoskeletal:  Positive for joint pain, gait problem, joint pain, joint swelling, myalgias, morning stiffness and myalgias. Negative for muscle weakness and muscle tenderness.  Skin:  Negative for color change, rash, hair loss, redness, skin tightness, ulcers and sensitivity to sunlight.  Allergic/Immunologic: Negative for susceptible to infections.  Neurological:  Negative for dizziness, numbness, headaches, memory loss, night sweats and weakness.  Hematological:  Negative for bruising/bleeding tendency and swollen glands.  Psychiatric/Behavioral:  Positive for depressed mood and sleep disturbance. Negative for confusion. The patient is nervous/anxious.    PMFS History:  Patient Active Problem List   Diagnosis Date Noted   Arthritis, multiple joint involvement 10/31/2020   Healthcare maintenance 10/31/2020   Depression 05/24/2015   OSA (obstructive sleep apnea) 04/04/2014   GERD (gastroesophageal reflux  disease) 12/29/2013   Mitral regurgitation 11/08/2013   Atrial flutter ([place]) 11/05/2013   CKD (chronic kidney disease) stage 2, GFR 60-89 ml/min 05/08/2013   Hypothyroidism 05/04/2013   Malignant neoplasm of upper-outer quadrant of right breast in female, estrogen receptor positive ([place]) 01/31/2013   Osteoarthritis    Asthma 12/08/2010   Obesity     Hyperlipidemia 10/16/2010   Hypertension    Diabetes mellitus, type 2 ([place])    Tobacco abuse, in remission     Past Medical History:  Diagnosis Date   Asthma    prn neb. and inhaler   Breast cancer ([place]) 01/2013   right   Dehydration 04/13/2013   GERD (gastroesophageal reflux disease)    Graves' disease    Hyperlipidemia    Hypertension    on multiple meds., has been on med. > 14 yr.   Non-insulin dependent type 2 diabetes mellitus ([place])    Obesity    Ophthalmic manifestation of Graves disease    Osteoarthritis 2003   left knee   Radiation 10/11/13-11/29/13   Right Breast   Sleep apnea 4/16   mod   Wears glasses     Family History  Problem Relation Age of Onset   Diabetes Mother    Heart disease Father    Lung cancer Father    Diabetes Sister    Hypertension Sister    Asthma Sister    Rheum arthritis Daughter    Hypertension Daughter    Fibromyalgia Daughter    Hypertension Daughter    Past Surgical History:  Procedure Laterality Date   CARDIOVERSION N/A 11/08/2013   Procedure: CARDIOVERSION;  Surgeon: Lelon Perla, MD;  Location: [facility] ENDOSCOPY;  Service: Cardiovascular;  Laterality: N/A;   COLONOSCOPY  2007   COLONOSCOPY W/ POLYPECTOMY  2009   EXCISION OF KELOID N/A 05/18/2014   Procedure: EXCISION OF KELOID;  Surgeon: Erroll Luna, MD;  Location: [place];  Service: General;  Laterality: N/A;   PORT-A-CATH REMOVAL Right 05/18/2014   Procedure: REMOVAL PORT-A-CATH;  Surgeon: Erroll Luna, MD;  Location: [place];  Service: General;  Laterality: Right;   PORTACATH PLACEMENT Right 02/17/2013   Procedure: INSERTION PORT-A-CATH;  Surgeon: Joyice Faster. Cornett, MD;  Location: [place];  Service: General;  Laterality: Right;   TEE WITHOUT CARDIOVERSION N/A 11/08/2013   Procedure: TRANSESOPHAGEAL ECHOCARDIOGRAM (TEE);  Surgeon: Lelon Perla, MD;  Location: [facility] ENDOSCOPY;  Service: Cardiovascular;  Laterality: N/A;    TOTAL KNEE ARTHROPLASTY Left 2003   [place]   Social History   Social History Narrative   Lives in [place]         Immunization History  Administered Date(s) Administered   Fluad Quad(high Dose 65+) 11/15/2019, 10/31/2020   Influenza Split 09/28/2012   Influenza,inj,Quad PF,6+ Mos 11/01/2013, 10/25/2014, 11/22/2015, 11/19/2016, 12/03/2017, 11/20/2018   Pneumococcal Polysaccharide-23 12/03/2017   Pneumococcal-Unspecified 12/12/2004     Objective: Vital Signs: BP (!) 169/85 (BP Location: Left Wrist, Patient Position: Sitting, Cuff Size: Normal)   Pulse 73   Ht 5' 4.5" (1.638 m)   Wt (!) 321 lb (145.6 kg)   BMI 54.25 kg/m    Physical Exam Vitals and nursing note reviewed.  Constitutional:      Appearance: She is well-developed.  HENT:     Head: Normocephalic and atraumatic.  Eyes:     Conjunctiva/sclera: Conjunctivae normal.  Cardiovascular:     Rate and Rhythm: Normal rate and  regular rhythm.     Heart sounds: Normal heart sounds.  Pulmonary:     Effort: Pulmonary effort is normal.     Breath sounds: Normal breath sounds.  Abdominal:     General: Bowel sounds are normal.     Palpations: Abdomen is soft.  Musculoskeletal:     Cervical back: Normal range of motion.  Lymphadenopathy:     Cervical: No cervical adenopathy.  Skin:    General: Skin is warm and dry.     Capillary Refill: Capillary refill takes less than 2 seconds.  Neurological:     Mental Status: She is alert and oriented to person, place, and time.  Psychiatric:        Behavior: Behavior normal.     Musculoskeletal Exam: C-spine was in good range of motion.  Limited painful range of motion of her lumbar spine which was difficult to assess in the sitting position.  She had good range of motion of bilateral shoulder joints with painful range of motion of her left shoulder joint.  Elbow joints and wrist joints with good range of motion.  She had no synovitis of  her wrist joints, MCPs PIPs or DIPs.  Hip joints were difficult to assess in the sitting position.  She had painful limited range of motion of her right knee joint without any warmth swelling or effusion.  Left knee joint is replaced and had no flexion or extension.  She had no tenderness over ankles.  Some pedal edema was noted.  There was no tenderness over MTPs.  CDAI Exam: CDAI Score: -- Patient Global: --; Provider Global: -- Swollen: --; Tender: -- Joint Exam 12/26/2020   No joint exam has been documented for this visit   There is currently no information documented on the homunculus. Go to the Rheumatology activity and complete the homunculus joint exam.  Investigation: No additional findings.  Imaging: No results found.  Recent Labs: Lab Results  Component Value Date   WBC 5.9 05/15/2020   HGB 12.4 05/15/2020   PLT 240 05/15/2020   NA 147 (H) 05/15/2020   K 4.1 05/15/2020   CL 104 05/15/2020   CO2 22 05/15/2020   GLUCOSE 160 (H) 05/15/2020   BUN 12 05/15/2020   CREATININE 0.98 05/15/2020   BILITOT 0.4 05/15/2020   ALKPHOS 77 05/15/2020   AST 20 05/15/2020   ALT 18 05/15/2020   PROT 7.7 05/15/2020   ALBUMIN 4.5 05/15/2020   CALCIUM 9.2 05/15/2020   GFRAA 66 06/08/2019    Speciality Comments: No specialty comments available.  Procedures:  No procedures performed Allergies: Procardia [nifedipine], Aspirin, and Diltiazem   Assessment / Plan:     Visit Diagnoses: Polyarthralgia-patient complains of pain and discomfort in multiple joints.  Chronic left shoulder pain -she had painful range of motion of her left shoulder joint although her shoulder was in good range of motion.  Plan: XR Shoulder Left.  X-ray showed osteoarthritic changes in the glenohumeral and acromioclavicular joint.  Pain in both hands -she complains of pain and discomfort in the bilateral hands and popping in her hands.  No synovitis was noted.  She is concerned as her daughter has rheumatoid  arthritis.  Plan: XR Hand 2 View Right, XR Hand 2 View Left, x-rays of bilateral hands were consistent with osteoarthritis.  Sedimentation rate, Rheumatoid factor, Cyclic citrul peptide antibody, IgG  Primary osteoarthritis of right knee -she has severe pain and discomfort in her knee joints and has problems with mobility.  I  reviewed her x-rays from 2018 which showed severe end-stage osteoarthritis and severe patellofemoral narrowing.  X-ray findings were discussed with the patient.  Patient stated that she is not interested in having total knee replacement as she had a bad experience with the surgery in her left knee.  Status post total knee replacement, left - 2003 by Dr. Tommie Raymond with limited flexion.  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  She has no mobility in her left knee joint.  DDD (degenerative disc disease), thoracic-she complains chronic upper and lower back pain.  I reviewed her x-rays from 2018 which showed some degenerative changes.  DDD (degenerative disc disease), lumbar-spondylosis was noted on the x-rays from 2018.  She has chronic lower back discomfort.  Other medical problems are listed as follows:  Primary hypertension-blood pressure is elevated at 169/85 today.  Have advised her to monitor blood pressure closely.  History of atrial flutter  Type 2 diabetes mellitus with chronic kidney disease, with long-term current use of insulin, unspecified CKD stage (HCC)  CKD (chronic kidney disease) stage 2, GFR 60-89 ml/min  History of hyperlipidemia  Malignant neoplasm of upper-outer quadrant of right breast in female, estrogen receptor positive ([place]) - 2015,lumpectomy, CTX and RTX.   Gastroesophageal reflux disease without esophagitis  Acquired hypothyroidism - s/p thyroidectomy due to goiter  History of asthma  Tobacco abuse, in remission  History of depression  OSA (obstructive sleep apnea) - on CPAP  Family history of rheumatoid arthritis -  Daughter  Orders: Orders Placed This Encounter  Procedures   XR Shoulder Left   XR Hand 2 View Right   XR Hand 2 View Left   Sedimentation rate   Rheumatoid factor   Cyclic citrul peptide antibody, IgG    No orders of the defined types were placed in this encounter.    Follow-Up Instructions: Return for Pain in multiple joints.   Bo Merino, MD  Note - This record has been created using Editor, commissioning.  Chart creation errors have been sought, but may not always  have been located. Such creation errors do not reflect on  the standard of medical care.

## 2020-12-18 ENCOUNTER — Other Ambulatory Visit: Payer: Self-pay

## 2020-12-18 DIAGNOSIS — I4892 Unspecified atrial flutter: Secondary | ICD-10-CM

## 2020-12-18 MED ORDER — APIXABAN 5 MG PO TABS: ORAL_TABLET | ORAL | 3 refills | Status: DC

## 2020-12-26 ENCOUNTER — Ambulatory Visit: Payer: Self-pay

## 2020-12-26 ENCOUNTER — Other Ambulatory Visit: Payer: Self-pay

## 2020-12-26 ENCOUNTER — Encounter: Payer: Self-pay | Admitting: Rheumatology

## 2020-12-26 ENCOUNTER — Ambulatory Visit: Payer: Medicare Other | Admitting: Rheumatology

## 2020-12-26 VITALS — BP 169/85 | HR 73 | Ht 64.5 in | Wt 321.0 lb

## 2020-12-26 DIAGNOSIS — M79641 Pain in right hand: Secondary | ICD-10-CM | POA: Diagnosis not present

## 2020-12-26 DIAGNOSIS — M1711 Unilateral primary osteoarthritis, right knee: Secondary | ICD-10-CM

## 2020-12-26 DIAGNOSIS — M79642 Pain in left hand: Secondary | ICD-10-CM

## 2020-12-26 DIAGNOSIS — F17201 Nicotine dependence, unspecified, in remission: Secondary | ICD-10-CM

## 2020-12-26 DIAGNOSIS — K219 Gastro-esophageal reflux disease without esophagitis: Secondary | ICD-10-CM

## 2020-12-26 DIAGNOSIS — I10 Essential (primary) hypertension: Secondary | ICD-10-CM

## 2020-12-26 DIAGNOSIS — Z8261 Family history of arthritis: Secondary | ICD-10-CM

## 2020-12-26 DIAGNOSIS — G4733 Obstructive sleep apnea (adult) (pediatric): Secondary | ICD-10-CM

## 2020-12-26 DIAGNOSIS — Z96652 Presence of left artificial knee joint: Secondary | ICD-10-CM

## 2020-12-26 DIAGNOSIS — Z794 Long term (current) use of insulin: Secondary | ICD-10-CM

## 2020-12-26 DIAGNOSIS — G8929 Other chronic pain: Secondary | ICD-10-CM

## 2020-12-26 DIAGNOSIS — Z170 Estrogen receptor positive status [ER+]: Secondary | ICD-10-CM

## 2020-12-26 DIAGNOSIS — Z8709 Personal history of other diseases of the respiratory system: Secondary | ICD-10-CM

## 2020-12-26 DIAGNOSIS — C50411 Malignant neoplasm of upper-outer quadrant of right female breast: Secondary | ICD-10-CM

## 2020-12-26 DIAGNOSIS — E1122 Type 2 diabetes mellitus with diabetic chronic kidney disease: Secondary | ICD-10-CM

## 2020-12-26 DIAGNOSIS — M2550 Pain in unspecified joint: Secondary | ICD-10-CM | POA: Diagnosis not present

## 2020-12-26 DIAGNOSIS — N182 Chronic kidney disease, stage 2 (mild): Secondary | ICD-10-CM

## 2020-12-26 DIAGNOSIS — E039 Hypothyroidism, unspecified: Secondary | ICD-10-CM

## 2020-12-26 DIAGNOSIS — M5136 Other intervertebral disc degeneration, lumbar region: Secondary | ICD-10-CM

## 2020-12-26 DIAGNOSIS — M25512 Pain in left shoulder: Secondary | ICD-10-CM | POA: Diagnosis not present

## 2020-12-26 DIAGNOSIS — Z8679 Personal history of other diseases of the circulatory system: Secondary | ICD-10-CM

## 2020-12-26 DIAGNOSIS — M5134 Other intervertebral disc degeneration, thoracic region: Secondary | ICD-10-CM

## 2020-12-26 DIAGNOSIS — Z8659 Personal history of other mental and behavioral disorders: Secondary | ICD-10-CM

## 2020-12-26 DIAGNOSIS — M51369 Other intervertebral disc degeneration, lumbar region without mention of lumbar back pain or lower extremity pain: Secondary | ICD-10-CM

## 2020-12-26 DIAGNOSIS — Z8639 Personal history of other endocrine, nutritional and metabolic disease: Secondary | ICD-10-CM

## 2020-12-26 NOTE — Patient Instructions (Signed)
Hand Exercises Hand exercises can be helpful for almost anyone. These exercises can strengthen the hands, improve flexibility and movement, and increase blood flow to the hands. These results can make work and daily tasks easier. Hand exercises can be especially helpful for people who have joint pain from arthritis or have nerve damage from overuse (carpal tunnel syndrome). These exercises can also help people who have injured a hand. Exercises Most of these hand exercises are gentle stretching and motion exercises. It is usually safe to do them often throughout the day. Warming up your hands before exercise may help to reduce stiffness. You can do this with gentle massage or by placing your hands in warm water for 10-15 minutes. It is normal to feel some stretching, pulling, tightness, or mild discomfort as you begin new exercises. This will gradually improve. Stop an exercise right away if you feel sudden, severe pain or your pain gets worse. Ask your health care provider which exercises are best for you. Knuckle bend or "claw" fist  Stand or sit with your arm, hand, and all five fingers pointed straight up. Make sure to keep your wrist straight during the exercise. Gently bend your fingers down toward your palm until the tips of your fingers are touching the top of your palm. Keep your big knuckle straight and just bend the small knuckles in your fingers. Hold this position for __________ seconds. Straighten (extend) your fingers back to the starting position. Repeat this exercise 5-10 times with each hand. Full finger fist  Stand or sit with your arm, hand, and all five fingers pointed straight up. Make sure to keep your wrist straight during the exercise. Gently bend your fingers into your palm until the tips of your fingers are touching the middle of your palm. Hold this position for __________ seconds. Extend your fingers back to the starting position, stretching every joint fully. Repeat  this exercise 5-10 times with each hand. Straight fist Stand or sit with your arm, hand, and all five fingers pointed straight up. Make sure to keep your wrist straight during the exercise. Gently bend your fingers at the big knuckle, where your fingers meet your hand, and the middle knuckle. Keep the knuckle at the tips of your fingers straight and try to touch the bottom of your palm. Hold this position for __________ seconds. Extend your fingers back to the starting position, stretching every joint fully. Repeat this exercise 5-10 times with each hand. Tabletop  Stand or sit with your arm, hand, and all five fingers pointed straight up. Make sure to keep your wrist straight during the exercise. Gently bend your fingers at the big knuckle, where your fingers meet your hand, as far down as you can while keeping the small knuckles in your fingers straight. Think of forming a tabletop with your fingers. Hold this position for __________ seconds. Extend your fingers back to the starting position, stretching every joint fully. Repeat this exercise 5-10 times with each hand. Finger spread  Place your hand flat on a table with your palm facing down. Make sure your wrist stays straight as you do this exercise. Spread your fingers and thumb apart from each other as far as you can until you feel a gentle stretch. Hold this position for __________ seconds. Bring your fingers and thumb tight together again. Hold this position for __________ seconds. Repeat this exercise 5-10 times with each hand. Making circles  Stand or sit with your arm, hand, and all five fingers pointed  straight up. Make sure to keep your wrist straight during the exercise. Make a circle by touching the tip of your thumb to the tip of your index finger. Hold for __________ seconds. Then open your hand wide. Repeat this motion with your thumb and each finger on your hand. Repeat this exercise 5-10 times with each hand. Thumb  motion  Sit with your forearm resting on a table and your wrist straight. Your thumb should be facing up toward the ceiling. Keep your fingers relaxed as you move your thumb. Lift your thumb up as high as you can toward the ceiling. Hold for __________ seconds. Bend your thumb across your palm as far as you can, reaching the tip of your thumb for the small finger (pinkie) side of your palm. Hold for __________ seconds. Repeat this exercise 5-10 times with each hand. Grip strengthening  Hold a stress ball or other soft ball in the middle of your hand. Slowly increase the pressure, squeezing the ball as much as you can without causing pain. Think of bringing the tips of your fingers into the middle of your palm. All of your finger joints should bend when doing this exercise. Hold your squeeze for __________ seconds, then relax. Repeat this exercise 5-10 times with each hand. Contact a health care provider if: Your hand pain or discomfort gets much worse when you do an exercise. Your hand pain or discomfort does not improve within 2 hours after you exercise. If you have any of these problems, stop doing these exercises right away. Do not do them again unless your health care provider says that you can. Get help right away if: You develop sudden, severe hand pain or swelling. If this happens, stop doing these exercises right away. Do not do them again unless your health care provider says that you can. This information is not intended to replace advice given to you by your health care provider. Make sure you discuss any questions you have with your health care provider. Shoulder Exercises Ask your health care provider which exercises are safe for you. Do exercises exactly as told by your health care provider and adjust them as directed. It is normal to feel mild stretching, pulling, tightness, or discomfort as you do these exercises. Stop right away if you feel sudden pain or your pain gets worse. Do  not begin these exercises until told by your health care provider. Stretching exercises External rotation and abduction This exercise is sometimes called corner stretch. This exercise rotates your arm outward (external rotation) and moves your arm out from your body (abduction). Stand in a doorway with one of your feet slightly in front of the other. This is called a staggered stance. If you cannot reach your forearms to the door frame, stand facing a corner of a room. Choose one of the following positions as told by your health care provider: Place your hands and forearms on the door frame above your head. Place your hands and forearms on the door frame at the height of your head. Place your hands on the door frame at the height of your elbows. Slowly move your weight onto your front foot until you feel a stretch across your chest and in the front of your shoulders. Keep your head and chest upright and keep your abdominal muscles tight. Hold for __________ seconds. To release the stretch, shift your weight to your back foot. Repeat __________ times. Complete this exercise __________ times a day. Extension, standing Stand and hold  a broomstick, a cane, or a similar object behind your back. Your hands should be a little wider than shoulder width apart. Your palms should face away from your back. Keeping your elbows straight and your shoulder muscles relaxed, move the stick away from your body until you feel a stretch in your shoulders (extension). Avoid shrugging your shoulders while you move the stick. Keep your shoulder blades tucked down toward the middle of your back. Hold for __________ seconds. Slowly return to the starting position. Repeat __________ times. Complete this exercise __________ times a day. Range-of-motion exercises Pendulum  Stand near a wall or a surface that you can hold onto for balance. Bend at the waist and let your left / right arm hang straight down. Use your  other arm to support you. Keep your back straight and do not lock your knees. Relax your left / right arm and shoulder muscles, and move your hips and your trunk so your left / right arm swings freely. Your arm should swing because of the motion of your body, not because you are using your arm or shoulder muscles. Keep moving your hips and trunk so your arm swings in the following directions, as told by your health care provider: Side to side. Forward and backward. In clockwise and counterclockwise circles. Continue each motion for __________ seconds, or for as long as told by your health care provider. Slowly return to the starting position. Repeat __________ times. Complete this exercise __________ times a day. Shoulder flexion, standing  Stand and hold a broomstick, a cane, or a similar object. Place your hands a little more than shoulder width apart on the object. Your left / right hand should be palm up, and your other hand should be palm down. Keep your elbow straight and your shoulder muscles relaxed. Push the stick up with your healthy arm to raise your left / right arm in front of your body, and then over your head until you feel a stretch in your shoulder (flexion). Avoid shrugging your shoulder while you raise your arm. Keep your shoulder blade tucked down toward the middle of your back. Hold for __________ seconds. Slowly return to the starting position. Repeat __________ times. Complete this exercise __________ times a day. Shoulder abduction, standing Stand and hold a broomstick, a cane, or a similar object. Place your hands a little more than shoulder width apart on the object. Your left / right hand should be palm up, and your other hand should be palm down. Keep your elbow straight and your shoulder muscles relaxed. Push the object across your body toward your left / right side. Raise your left / right arm to the side of your body (abduction) until you feel a stretch in your  shoulder. Do not raise your arm above shoulder height unless your health care provider tells you to do that. If directed, raise your arm over your head. Avoid shrugging your shoulder while you raise your arm. Keep your shoulder blade tucked down toward the middle of your back. Hold for __________ seconds. Slowly return to the starting position. Repeat __________ times. Complete this exercise __________ times a day. Internal rotation  Place your left / right hand behind your back, palm up. Use your other hand to dangle an exercise band, a towel, or a similar object over your shoulder. Grasp the band with your left / right hand so you are holding on to both ends. Gently pull up on the band until you feel a stretch in  the front of your left / right shoulder. The movement of your arm toward the center of your body is called internal rotation. Avoid shrugging your shoulder while you raise your arm. Keep your shoulder blade tucked down toward the middle of your back. Hold for __________ seconds. Release the stretch by letting go of the band and lowering your hands. Repeat __________ times. Complete this exercise __________ times a day. Strengthening exercises External rotation  Sit in a stable chair without armrests. Secure an exercise band to a stable object at elbow height on your left / right side. Place a soft object, such as a folded towel or a small pillow, between your left / right upper arm and your body to move your elbow about 4 inches (10 cm) away from your side. Hold the end of the exercise band so it is tight and there is no slack. Keeping your elbow pressed against the soft object, slowly move your forearm out, away from your abdomen (external rotation). Keep your body steady so only your forearm moves. Hold for __________ seconds. Slowly return to the starting position. Repeat __________ times. Complete this exercise __________ times a day. Shoulder abduction  Sit in a stable  chair without armrests, or stand up. Hold a __________ weight in your left / right hand, or hold an exercise band with both hands. Start with your arms straight down and your left / right palm facing in, toward your body. Slowly lift your left / right hand out to your side (abduction). Do not lift your hand above shoulder height unless your health care provider tells you that this is safe. Keep your arms straight. Avoid shrugging your shoulder while you do this movement. Keep your shoulder blade tucked down toward the middle of your back. Hold for __________ seconds. Slowly lower your arm, and return to the starting position. Repeat __________ times. Complete this exercise __________ times a day. Shoulder extension Sit in a stable chair without armrests, or stand up. Secure an exercise band to a stable object in front of you so it is at shoulder height. Hold one end of the exercise band in each hand. Your palms should face each other. Straighten your elbows and lift your hands up to shoulder height. Step back, away from the secured end of the exercise band, until the band is tight and there is no slack. Squeeze your shoulder blades together as you pull your hands down to the sides of your thighs (extension). Stop when your hands are straight down by your sides. Do not let your hands go behind your body. Hold for __________ seconds. Slowly return to the starting position. Repeat __________ times. Complete this exercise __________ times a day. Shoulder row Sit in a stable chair without armrests, or stand up. Secure an exercise band to a stable object in front of you so it is at waist height. Hold one end of the exercise band in each hand. Position your palms so that your thumbs are facing the ceiling (neutral position). Bend each of your elbows to a 90-degree angle (right angle) and keep your upper arms at your sides. Step back until the band is tight and there is no slack. Slowly pull your  elbows back behind you. Hold for __________ seconds. Slowly return to the starting position. Repeat __________ times. Complete this exercise __________ times a day. Shoulder press-ups  Sit in a stable chair that has armrests. Sit upright, with your feet flat on the floor. Put your hands on  the armrests so your elbows are bent and your fingers are pointing forward. Your hands should be about even with the sides of your body. Push down on the armrests and use your arms to lift yourself off the chair. Straighten your elbows and lift yourself up as much as you comfortably can. Move your shoulder blades down, and avoid letting your shoulders move up toward your ears. Keep your feet on the ground. As you get stronger, your feet should support less of your body weight as you lift yourself up. Hold for __________ seconds. Slowly lower yourself back into the chair. Repeat __________ times. Complete this exercise __________ times a day. Wall push-ups  Stand so you are facing a stable wall. Your feet should be about one arm-length away from the wall. Lean forward and place your palms on the wall at shoulder height. Keep your feet flat on the floor as you bend your elbows and lean forward toward the wall. Hold for __________ seconds. Straighten your elbows to push yourself back to the starting position. Repeat __________ times. Complete this exercise __________ times a day. This information is not intended to replace advice given to you by your health care provider. Make sure you discuss any questions you have with your health care provider. Document Revised: 04/23/2018 Document Reviewed: 01/29/2018 Elsevier Patient Education  [place].

## 2020-12-27 LAB — SEDIMENTATION RATE: Sed Rate: 14 mm/h (ref 0–30)

## 2020-12-27 LAB — RHEUMATOID FACTOR: Rheumatoid fact SerPl-aCnc: <14 IU/mL (ref <14)

## 2020-12-27 LAB — CYCLIC CITRUL PEPTIDE ANTIBODY, IGG: Cyclic Citrullin Peptide Ab: <16 UNITS

## 2020-12-28 ENCOUNTER — Other Ambulatory Visit: Payer: Self-pay | Admitting: Family Medicine

## 2020-12-28 DIAGNOSIS — J45909 Unspecified asthma, uncomplicated: Secondary | ICD-10-CM

## 2021-01-01 ENCOUNTER — Other Ambulatory Visit: Payer: Self-pay | Admitting: Family Medicine

## 2021-01-01 DIAGNOSIS — J45909 Unspecified asthma, uncomplicated: Secondary | ICD-10-CM

## 2021-01-01 MED ORDER — ALBUTEROL SULFATE HFA 108 (90 BASE) MCG/ACT IN AERS: INHALATION_SPRAY | RESPIRATORY_TRACT | 2 refills | Status: DC

## 2021-01-01 NOTE — Telephone Encounter (Signed)
Patient states has been trying for a week now to get refill on her albuterol inhaler completely out. [place]

## 2021-01-07 ENCOUNTER — Other Ambulatory Visit: Payer: Self-pay | Admitting: Family Medicine

## 2021-01-07 DIAGNOSIS — I10 Essential (primary) hypertension: Secondary | ICD-10-CM

## 2021-01-11 DIAGNOSIS — M19041 Primary osteoarthritis, right hand: Secondary | ICD-10-CM | POA: Insufficient documentation

## 2021-01-11 NOTE — Progress Notes (Deleted)
Office Visit Note  Patient: [name]             Date of Birth: May 08, 1955           MRN: [id number]             PCP: Coral Spikes, DO Referring: Coral Spikes, DO Visit Date: 01/24/2021 Occupation: @GUAROCC @  Subjective:  No chief complaint on file.   History of Present Illness: [name] is a 65 y.o. female ***   Activities of Daily Living:  Patient reports morning stiffness for *** {minute/hour:19697}.   Patient {ACTIONS;DENIES/REPORTS:21021675::"Denies"} nocturnal pain.  Difficulty dressing/grooming: {ACTIONS;DENIES/REPORTS:21021675::"Denies"} Difficulty climbing stairs: {ACTIONS;DENIES/REPORTS:21021675::"Denies"} Difficulty getting out of chair: {ACTIONS;DENIES/REPORTS:21021675::"Denies"} Difficulty using hands for taps, buttons, cutlery, and/or writing: {ACTIONS;DENIES/REPORTS:21021675::"Denies"}  No Rheumatology ROS completed.   PMFS History:  Patient Active Problem List   Diagnosis Date Noted   Arthritis, multiple joint involvement 10/31/2020   Healthcare maintenance 10/31/2020   Depression 05/24/2015   OSA (obstructive sleep apnea) 04/04/2014   GERD (gastroesophageal reflux disease) 12/29/2013   Mitral regurgitation 11/08/2013   Atrial flutter ([place]) 11/05/2013   CKD (chronic kidney disease) stage 2, GFR 60-89 ml/min 05/08/2013   Hypothyroidism 05/04/2013   Malignant neoplasm of upper-outer quadrant of right breast in female, estrogen receptor positive ([place]) 01/31/2013   Osteoarthritis    Asthma 12/08/2010   Obesity    Hyperlipidemia 10/16/2010   Hypertension    Diabetes mellitus, type 2 ([place])    Tobacco abuse, in remission     Past Medical History:  Diagnosis Date   Asthma    prn neb. and inhaler   Breast cancer ([place]) 01/2013   right   Dehydration 04/13/2013   GERD (gastroesophageal reflux disease)    Graves' disease    Hyperlipidemia    Hypertension    on multiple meds., has been on med. > 14 yr.   Non-insulin dependent type 2  diabetes mellitus ([place])    Obesity    Ophthalmic manifestation of Graves disease    Osteoarthritis 2003   left knee   Radiation 10/11/13-11/29/13   Right Breast   Sleep apnea 4/16   mod   Wears glasses     Family History  Problem Relation Age of Onset   Diabetes Mother    Heart disease Father    Lung cancer Father    Diabetes Sister    Hypertension Sister    Asthma Sister    Rheum arthritis Daughter    Hypertension Daughter    Fibromyalgia Daughter    Hypertension Daughter    Past Surgical History:  Procedure Laterality Date   CARDIOVERSION N/A 11/08/2013   Procedure: CARDIOVERSION;  Surgeon: Lelon Perla, MD;  Location: [facility] ENDOSCOPY;  Service: Cardiovascular;  Laterality: N/A;   COLONOSCOPY  2007   COLONOSCOPY W/ POLYPECTOMY  2009   EXCISION OF KELOID N/A 05/18/2014   Procedure: EXCISION OF KELOID;  Surgeon: Erroll Luna, MD;  Location: [place];  Service: General;  Laterality: N/A;   PORT-A-CATH REMOVAL Right 05/18/2014   Procedure: REMOVAL PORT-A-CATH;  Surgeon: Erroll Luna, MD;  Location: [place];  Service: General;  Laterality: Right;   PORTACATH PLACEMENT Right 02/17/2013   Procedure: INSERTION PORT-A-CATH;  Surgeon: Joyice Faster. Cornett, MD;  Location: [place];  Service: General;  Laterality: Right;   TEE WITHOUT CARDIOVERSION N/A 11/08/2013   Procedure: TRANSESOPHAGEAL ECHOCARDIOGRAM (TEE);  Surgeon: Lelon Perla, MD;  Location: [place];  Service: Cardiovascular;  Laterality:  N/A;   TOTAL KNEE ARTHROPLASTY Left 2003   TOTAL THYROIDECTOMY     TUBAL LIGATION  1985   Social History   Social History Narrative   Lives in [place]         Immunization History  Administered Date(s) Administered   Fluad Quad(high Dose 65+) 11/15/2019, 10/31/2020   Influenza Split 09/28/2012   Influenza,inj,Quad PF,6+ Mos 11/01/2013, 10/25/2014, 11/22/2015, 11/19/2016, 12/03/2017, 11/20/2018   Pneumococcal  Polysaccharide-23 12/03/2017   Pneumococcal-Unspecified 12/12/2004     Objective: Vital Signs: There were no vitals taken for this visit.   Physical Exam   Musculoskeletal Exam: ***  CDAI Exam: CDAI Score: -- Patient Global: --; Provider Global: -- Swollen: --; Tender: -- Joint Exam 01/24/2021   No joint exam has been documented for this visit   There is currently no information documented on the homunculus. Go to the Rheumatology activity and complete the homunculus joint exam.  Investigation: No additional findings.  Imaging: XR Hand 2 View Left  Result Date: 12/26/2020 Severe CMC narrowing, PIP and DIP narrowing was noted.  No MCP, intercarpal or radiocarpal joint space narrowing was noted.  No erosive changes were noted. Impression: These findings are consistent with osteoarthritis of the hand.  XR Hand 2 View Right  Result Date: 12/26/2020 Severe CMC narrowing, PIP and DIP narrowing was noted.  No MCP, intercarpal or radiocarpal joint space narrowing was noted.  No erosive changes were noted. Impression: These findings are consistent with osteoarthritis of the hand.  XR Shoulder Left  Result Date: 12/26/2020 Glenohumeral narrowing was noted.  Acromial spurring was noted.  Acromioclavicular narrowing with spurring was noted.  No chondrocalcinosis was noted. Impression: These findings are consistent with osteoarthritis of the shoulder.   Recent Labs: Lab Results  Component Value Date   WBC 5.9 05/15/2020   HGB 12.4 05/15/2020   PLT 240 05/15/2020   NA 147 (H) 05/15/2020   K 4.1 05/15/2020   CL 104 05/15/2020   CO2 22 05/15/2020   GLUCOSE 160 (H) 05/15/2020   BUN 12 05/15/2020   CREATININE 0.98 05/15/2020   BILITOT 0.4 05/15/2020   ALKPHOS 77 05/15/2020   AST 20 05/15/2020   ALT 18 05/15/2020   PROT 7.7 05/15/2020   ALBUMIN 4.5 05/15/2020   CALCIUM 9.2 05/15/2020   GFRAA 66 06/08/2019   December 26, 2020 ESR 14, RF negative, anti-CCP  negative  Speciality Comments: No specialty comments available.  Procedures:  No procedures performed Allergies: Procardia [nifedipine], Aspirin, and Diltiazem   Assessment / Plan:     Visit Diagnoses: No diagnosis found.  Orders: No orders of the defined types were placed in this encounter.  No orders of the defined types were placed in this encounter.   Face-to-face time spent with patient was *** minutes. Greater than 50% of time was spent in counseling and coordination of care.  Follow-Up Instructions: No follow-ups on file.   Bo Merino, MD  Note - This record has been created using Editor, commissioning.  Chart creation errors have been sought, but may not always  have been located. Such creation errors do not reflect on  the standard of medical care.

## 2021-01-18 ENCOUNTER — Other Ambulatory Visit: Payer: Self-pay

## 2021-01-18 DIAGNOSIS — I10 Essential (primary) hypertension: Secondary | ICD-10-CM

## 2021-01-18 MED ORDER — AMLODIPINE BESYLATE 10 MG PO TABS: ORAL_TABLET | ORAL | 1 refills | Status: DC

## 2021-01-24 ENCOUNTER — Ambulatory Visit: Payer: Medicare Other | Admitting: Rheumatology

## 2021-02-06 ENCOUNTER — Other Ambulatory Visit: Payer: Self-pay | Admitting: Family Medicine

## 2021-02-06 DIAGNOSIS — M129 Arthropathy, unspecified: Secondary | ICD-10-CM

## 2021-03-15 ENCOUNTER — Other Ambulatory Visit: Payer: Self-pay | Admitting: Family Medicine

## 2021-03-15 DIAGNOSIS — M129 Arthropathy, unspecified: Secondary | ICD-10-CM

## 2021-03-27 NOTE — Progress Notes (Signed)
? ?Office Visit Note ? ?Patient: [name]             ?Date of Birth: 06-Jun-1955           ?MRN: [id number]             ?PCP: Coral Spikes, DO ?Referring: Coral Spikes, DO ?Visit Date: 04/09/2021 ?Occupation: @GUAROCC @ ? ?Subjective:  ?Pain in multiple joints ? ?History of Present Illness: [name] is a 66 y.o. female with history of osteoarthritis involving multiple joints.  She continues to have pain and discomfort in her right shoulder, bilateral hands and her knee joints.  She has difficulty walking due to severe arthritis in her knee joints.  She mobilizes with the help of a walker.  She continues to have discomfort in her upper and lower back due to underlying disc disease. ? ?Activities of Daily Living:  ?Patient reports morning stiffness for all day. ?Patient Denies nocturnal pain.  ?Difficulty dressing/grooming: Reports ?Difficulty climbing stairs: Reports ?Difficulty getting out of chair: Reports ?Difficulty using hands for taps, buttons, cutlery, and/or writing: Denies ? ?Review of Systems  ?Constitutional:  Positive for fatigue.  ?HENT:  Positive for mouth dryness and nose dryness. Negative for mouth sores.   ?Eyes:  Positive for itching and dryness. Negative for pain.  ?Respiratory:  Positive for shortness of breath and difficulty breathing.   ?Cardiovascular:  Negative for chest pain and palpitations.  ?Gastrointestinal:  Negative for blood in stool, constipation and diarrhea.  ?Endocrine: Negative for increased urination.  ?Genitourinary:  Negative for difficulty urinating.  ?Musculoskeletal:  Positive for joint pain, joint pain, joint swelling, myalgias, morning stiffness, muscle tenderness and myalgias.  ?Skin:  Negative for color change, rash and redness.  ?Allergic/Immunologic: Positive for susceptible to infections.  ?Neurological:  Positive for weakness. Negative for dizziness, numbness, headaches and memory loss.  ?Hematological:  Positive for bruising/bleeding tendency.   ?Psychiatric/Behavioral:  Negative for confusion.   ? ?PMFS History:  ?Patient Active Problem List  ? Diagnosis Date Noted  ? Primary osteoarthritis of both hands 01/11/2021  ? Arthritis, multiple joint involvement 10/31/2020  ? Healthcare maintenance 10/31/2020  ? Depression 05/24/2015  ? OSA (obstructive sleep apnea) 04/04/2014  ? GERD (gastroesophageal reflux disease) 12/29/2013  ? Mitral regurgitation 11/08/2013  ? Atrial flutter ([place]) 11/05/2013  ? CKD (chronic kidney disease) stage 2, GFR 60-89 ml/min 05/08/2013  ? Hypothyroidism 05/04/2013  ? Malignant neoplasm of upper-outer quadrant of right breast in female, estrogen receptor positive ([place]) 01/31/2013  ? Osteoarthritis   ? Asthma 12/08/2010  ? Obesity   ? Hyperlipidemia 10/16/2010  ? Hypertension   ? Diabetes mellitus, type 2 ([place])   ? Tobacco abuse, in remission   ?  ?Past Medical History:  ?Diagnosis Date  ? Asthma   ? prn neb. and inhaler  ? Breast cancer ([place]) 01/2013  ? right  ? Dehydration 04/13/2013  ? GERD (gastroesophageal reflux disease)   ? Graves' disease   ? Hyperlipidemia   ? Hypertension   ? on multiple meds., has been on med. > 14 yr.  ? Non-insulin dependent type 2 diabetes mellitus ([place])   ? Obesity   ? Ophthalmic manifestation of Graves disease   ? Osteoarthritis 2003  ? left knee  ? Radiation 10/11/13-11/29/13  ? Right Breast  ? Sleep apnea 4/16  ? mod  ? Wears glasses   ?  ?Family History  ?Problem Relation Age of Onset  ? Diabetes Mother   ? Heart disease  Father   ? Lung cancer Father   ? Diabetes Sister   ? Hypertension Sister   ? Asthma Sister   ? Rheum arthritis Daughter   ? Hypertension Daughter   ? Fibromyalgia Daughter   ? Hypertension Daughter   ? ?Past Surgical History:  ?Procedure Laterality Date  ? CARDIOVERSION N/A 11/08/2013  ? Procedure: CARDIOVERSION;  Surgeon: Lelon Perla, MD;  Location: [facility] ENDOSCOPY;  Service: Cardiovascular;  Laterality: N/A;  ? COLONOSCOPY  2007  ? COLONOSCOPY W/ POLYPECTOMY  2009  ? EXCISION OF  KELOID N/A 05/18/2014  ? Procedure: EXCISION OF KELOID;  Surgeon: Erroll Luna, MD;  Location: [place];  Service: General;  Laterality: N/A;  ? PORT-A-CATH REMOVAL Right 05/18/2014  ? Procedure: REMOVAL PORT-A-CATH;  Surgeon: Erroll Luna, MD;  Location: [place];  Service: General;  Laterality: Right;  ? PORTACATH PLACEMENT Right 02/17/2013  ? Procedure: INSERTION PORT-A-CATH;  Surgeon: Joyice Faster. Cornett, MD;  Location: [place];  Service: General;  Laterality: Right;  ? TEE WITHOUT CARDIOVERSION N/A 11/08/2013  ? Procedure: TRANSESOPHAGEAL ECHOCARDIOGRAM (TEE);  Surgeon: Lelon Perla, MD;  Location: [place];  Service: Cardiovascular;  Laterality: N/A;  ? TOTAL KNEE ARTHROPLASTY Left 2003  ? TOTAL THYROIDECTOMY    ? TUBAL LIGATION  1985  ? ?Social History  ? ?Social History Narrative  ? Lives in [place]  ?   ?   ? ?Immunization History  ?Administered Date(s) Administered  ? Fluad Quad(high Dose 65+) 11/15/2019, 10/31/2020  ? Influenza Split 09/28/2012  ? Influenza,inj,Quad PF,6+ Mos 11/01/2013, 10/25/2014, 11/22/2015, 11/19/2016, 12/03/2017, 11/20/2018  ? Pneumococcal Polysaccharide-23 12/03/2017  ? Pneumococcal-Unspecified 12/12/2004  ?  ? ?Objective: ?Vital Signs: BP (!) 171/95 (BP Location: Left Wrist, Patient Position: Sitting, Cuff Size: Normal)   Pulse 67   Ht 5' 4.5" (1.638 m)   Wt (!) 327 lb 9.6 oz (148.6 kg)   BMI 55.36 kg/m?   ? ?Physical Exam ?Vitals and nursing note reviewed.  ?Constitutional:   ?   Appearance: She is well-developed.  ?HENT:  ?   Head: Normocephalic and atraumatic.  ?Eyes:  ?   Conjunctiva/sclera: Conjunctivae normal.  ?Cardiovascular:  ?   Rate and Rhythm: Normal rate and regular rhythm.  ?   Heart sounds: Normal heart sounds.  ?Pulmonary:  ?   Effort: Pulmonary effort is normal.  ?   Breath sounds: Normal breath sounds.  ?Abdominal:  ?   General: Bowel sounds are normal.  ?   Palpations: Abdomen is soft.   ?Musculoskeletal:  ?   Cervical back: Normal range of motion.  ?Lymphadenopathy:  ?   Cervical: No cervical adenopathy.  ?Skin: ?   General: Skin is warm and dry.  ?   Capillary Refill: Capillary refill takes less than 2 seconds.  ?Neurological:  ?   Mental Status: She is alert and oriented to person, place, and time.  ?Psychiatric:     ?   Behavior: Behavior normal.  ?  ? ?Musculoskeletal Exam: C-spine was in good range of motion.  Thoracic and lumbar spine were difficult to assess.  She had painful range of motion of her right shoulder joint.  Elbow joints with good range of motion.  She had bilateral PIP and DIP thickening with no synovitis.  Hip joints were difficult to assess in the sitting position.  She had no flexion or extension in her left knee joint.  No synovitis was noted.  No warmth swelling or effusion was  noted in the right knee joint which had limited range of motion.  There was no tenderness over ankles or MTPs. ? ?CDAI Exam: ?CDAI Score: -- ?Patient Global: --; Provider Global: -- ?Swollen: --; Tender: -- ?Joint Exam 04/09/2021  ? ?No joint exam has been documented for this visit  ? ?There is currently no information documented on the homunculus. Go to the Rheumatology activity and complete the homunculus joint exam. ? ?Investigation: ?No additional findings. ? ?Imaging: ?No results found. ? ?Recent Labs: ?Lab Results  ?Component Value Date  ? WBC 5.9 05/15/2020  ? HGB 12.4 05/15/2020  ? PLT 240 05/15/2020  ? NA 147 (H) 05/15/2020  ? K 4.1 05/15/2020  ? CL 104 05/15/2020  ? CO2 22 05/15/2020  ? GLUCOSE 160 (H) 05/15/2020  ? BUN 12 05/15/2020  ? CREATININE 0.98 05/15/2020  ? BILITOT 0.4 05/15/2020  ? ALKPHOS 77 05/15/2020  ? AST 20 05/15/2020  ? ALT 18 05/15/2020  ? PROT 7.7 05/15/2020  ? ALBUMIN 4.5 05/15/2020  ? CALCIUM 9.2 05/15/2020  ? GFRAA 66 06/08/2019  ? ?December 26, 2020 ESR 14, RF negative, anti-CCP negative ? ?Speciality Comments: No specialty comments available. ? ?Procedures:  ?No  procedures performed ?Allergies: Procardia [nifedipine], Aspirin, and Diltiazem  ? ?Assessment / Plan:     ?Visit Diagnoses: Chronic left shoulder pain -she continues to have pain and discomfort in the left shoulder.  No effusion

## 2021-03-29 DIAGNOSIS — E7800 Pure hypercholesterolemia, unspecified: Secondary | ICD-10-CM | POA: Diagnosis not present

## 2021-03-29 DIAGNOSIS — E1140 Type 2 diabetes mellitus with diabetic neuropathy, unspecified: Secondary | ICD-10-CM | POA: Diagnosis not present

## 2021-03-29 DIAGNOSIS — I10 Essential (primary) hypertension: Secondary | ICD-10-CM | POA: Diagnosis not present

## 2021-03-29 DIAGNOSIS — E890 Postprocedural hypothyroidism: Secondary | ICD-10-CM | POA: Diagnosis not present

## 2021-03-29 DIAGNOSIS — E1165 Type 2 diabetes mellitus with hyperglycemia: Secondary | ICD-10-CM | POA: Diagnosis not present

## 2021-04-09 ENCOUNTER — Ambulatory Visit (INDEPENDENT_AMBULATORY_CARE_PROVIDER_SITE_OTHER): Payer: Medicare Other | Admitting: Rheumatology

## 2021-04-09 ENCOUNTER — Other Ambulatory Visit: Payer: Self-pay

## 2021-04-09 ENCOUNTER — Encounter: Payer: Self-pay | Admitting: Rheumatology

## 2021-04-09 VITALS — BP 171/95 | HR 67 | Ht 64.5 in | Wt 327.6 lb

## 2021-04-09 DIAGNOSIS — M25512 Pain in left shoulder: Secondary | ICD-10-CM

## 2021-04-09 DIAGNOSIS — C50411 Malignant neoplasm of upper-outer quadrant of right female breast: Secondary | ICD-10-CM

## 2021-04-09 DIAGNOSIS — Z96652 Presence of left artificial knee joint: Secondary | ICD-10-CM | POA: Diagnosis not present

## 2021-04-09 DIAGNOSIS — Z8709 Personal history of other diseases of the respiratory system: Secondary | ICD-10-CM

## 2021-04-09 DIAGNOSIS — Z170 Estrogen receptor positive status [ER+]: Secondary | ICD-10-CM

## 2021-04-09 DIAGNOSIS — Z8261 Family history of arthritis: Secondary | ICD-10-CM

## 2021-04-09 DIAGNOSIS — M1711 Unilateral primary osteoarthritis, right knee: Secondary | ICD-10-CM

## 2021-04-09 DIAGNOSIS — Z8659 Personal history of other mental and behavioral disorders: Secondary | ICD-10-CM

## 2021-04-09 DIAGNOSIS — E1122 Type 2 diabetes mellitus with diabetic chronic kidney disease: Secondary | ICD-10-CM

## 2021-04-09 DIAGNOSIS — I10 Essential (primary) hypertension: Secondary | ICD-10-CM

## 2021-04-09 DIAGNOSIS — M19041 Primary osteoarthritis, right hand: Secondary | ICD-10-CM

## 2021-04-09 DIAGNOSIS — F17201 Nicotine dependence, unspecified, in remission: Secondary | ICD-10-CM

## 2021-04-09 DIAGNOSIS — M5136 Other intervertebral disc degeneration, lumbar region: Secondary | ICD-10-CM

## 2021-04-09 DIAGNOSIS — N182 Chronic kidney disease, stage 2 (mild): Secondary | ICD-10-CM

## 2021-04-09 DIAGNOSIS — E039 Hypothyroidism, unspecified: Secondary | ICD-10-CM

## 2021-04-09 DIAGNOSIS — G4733 Obstructive sleep apnea (adult) (pediatric): Secondary | ICD-10-CM

## 2021-04-09 DIAGNOSIS — Z8679 Personal history of other diseases of the circulatory system: Secondary | ICD-10-CM

## 2021-04-09 DIAGNOSIS — M5134 Other intervertebral disc degeneration, thoracic region: Secondary | ICD-10-CM

## 2021-04-09 DIAGNOSIS — M19042 Primary osteoarthritis, left hand: Secondary | ICD-10-CM

## 2021-04-09 DIAGNOSIS — K219 Gastro-esophageal reflux disease without esophagitis: Secondary | ICD-10-CM

## 2021-04-09 DIAGNOSIS — Z794 Long term (current) use of insulin: Secondary | ICD-10-CM

## 2021-04-09 DIAGNOSIS — G8929 Other chronic pain: Secondary | ICD-10-CM

## 2021-04-09 DIAGNOSIS — Z8639 Personal history of other endocrine, nutritional and metabolic disease: Secondary | ICD-10-CM

## 2021-04-10 ENCOUNTER — Other Ambulatory Visit: Payer: Self-pay | Admitting: Family Medicine

## 2021-04-10 DIAGNOSIS — I10 Essential (primary) hypertension: Secondary | ICD-10-CM

## 2021-04-19 ENCOUNTER — Other Ambulatory Visit: Payer: Self-pay | Admitting: Family Medicine

## 2021-04-19 DIAGNOSIS — M129 Arthropathy, unspecified: Secondary | ICD-10-CM

## 2021-04-19 DIAGNOSIS — I10 Essential (primary) hypertension: Secondary | ICD-10-CM

## 2021-04-29 ENCOUNTER — Ambulatory Visit: Payer: Medicare Other | Admitting: Orthopedic Surgery

## 2021-04-30 ENCOUNTER — Ambulatory Visit (INDEPENDENT_AMBULATORY_CARE_PROVIDER_SITE_OTHER): Payer: Medicare Other

## 2021-04-30 ENCOUNTER — Other Ambulatory Visit: Payer: Self-pay | Admitting: Family Medicine

## 2021-04-30 VITALS — Ht 64.5 in | Wt 327.0 lb

## 2021-04-30 DIAGNOSIS — E785 Hyperlipidemia, unspecified: Secondary | ICD-10-CM

## 2021-04-30 DIAGNOSIS — C50411 Malignant neoplasm of upper-outer quadrant of right female breast: Secondary | ICD-10-CM | POA: Diagnosis not present

## 2021-04-30 DIAGNOSIS — Z1231 Encounter for screening mammogram for malignant neoplasm of breast: Secondary | ICD-10-CM

## 2021-04-30 DIAGNOSIS — Z0000 Encounter for general adult medical examination without abnormal findings: Secondary | ICD-10-CM | POA: Diagnosis not present

## 2021-04-30 DIAGNOSIS — Z170 Estrogen receptor positive status [ER+]: Secondary | ICD-10-CM | POA: Diagnosis not present

## 2021-04-30 DIAGNOSIS — E1140 Type 2 diabetes mellitus with diabetic neuropathy, unspecified: Secondary | ICD-10-CM | POA: Insufficient documentation

## 2021-04-30 NOTE — Progress Notes (Signed)
? ?Subjective:  ? [name] is a 66 y.o. female who presents for an Initial Medicare Annual Wellness Visit. ?Virtual Visit via Telephone Note ? ?I connected with  [name] on 04/30/21 at  3:15 PM EDT by telephone and verified that I am speaking with the correct person using two identifiers. ? ?Location: ?Patient: HOME ?Provider: [facility] ?Persons participating in the virtual visit: patient/Nurse Health Advisor ?  ?I discussed the limitations, risks, security and privacy concerns of performing an evaluation and management service by telephone and the availability of in person appointments. The patient expressed understanding and agreed to proceed. ? ?Interactive audio and video telecommunications were attempted between this nurse and patient, however failed, due to patient having technical difficulties OR patient did not have access to video capability.  We continued and completed visit with audio only. ? ?Some vital signs may be absent or patient reported.  ? ?Chriss Driver, LPN ? ?Review of Systems    ? ?Cardiac Risk Factors include: advanced age (>5mn, >>16women);hypertension;sedentary lifestyle;obesity (BMI >30kg/m2);dyslipidemia;Other (see comment), Risk factor comments: Atrial Flutter, DDD, R knee pain ? ?   ?Objective:  ?  ?Today's Vitals  ? 04/30/21 1504 04/30/21 1506  ?Weight: (!) 327 lb (148.3 kg)   ?Height: 5' 4.5" (1.638 m)   ?PainSc:  7   ? ?Body mass index is 55.26 kg/m?. ? ? ?  04/30/2021  ?  3:16 PM 09/06/2015  ?  2:29 PM 04/07/2015  ? 10:37 PM 03/06/2015  ? 11:51 AM 09/04/2014  ? 12:04 PM 08/07/2014  ?  3:47 PM 05/25/2014  ?  9:02 AM  ?Advanced Directives  ?Does Patient Have a Medical Advance Directive? No No No No No No No  ?Would patient like information on creating a medical advance directive? No - Patient declined No - patient declined information     No - patient declined information  ? ? ?Current Medications (verified) ?Outpatient Encounter Medications as of 04/30/2021  ?Medication  Sig  ? albuterol (PROVENTIL) (2.5 MG/3ML) 0.083% nebulizer solution USE 1 VIAL IN NEBULIZER EVERY 6 HOURS AS NEEDED FOR WHEEZING OR SHORTNESS OF BREATH.  ? albuterol (VENTOLIN HFA) 108 (90 Base) MCG/ACT inhaler INHALE 2 PUFFS INTO THE LUNGS EVERY 6 HOURS AS NEEDED FOR SHORTNESS OF BREATH OR WHEEZING.  ? amLODipine (NORVASC) 10 MG tablet TAKE (1) TABLET BY MOUTH ONCE DAILY.  ? apixaban (ELIQUIS) 5 MG TABS tablet TAKE (1) TABLET BY MOUTH TWICE DAILY.  ? benazepril (LOTENSIN) 40 MG tablet TAKE (1) TABLET BY MOUTH ONCE DAILY.  ? Biotin 1000 MCG tablet Take 1,000 mcg by mouth daily.  ? carvedilol (COREG) 12.5 MG tablet TAKE (1) TABLET BY MOUTH TWICE DAILY.  ? Dulaglutide (TRULICITY) 00.81MKG/8.1EHSOPN See admin instructions.  ? fluticasone (FLOVENT HFA) 44 MCG/ACT inhaler Inhale 2 puffs into the lungs 2 (two) times daily.  ? gabapentin (NEURONTIN) 100 MG capsule TAKE 1 CAPSULE BY MOUTH THREE TIMES A DAY.  ? HUMALOG MIX 75/25 KWIKPEN (75-25) 100 UNIT/ML Kwikpen   ? Incontinence Supply Disposable (DEPEND UNDERWEAR X-LARGE) MISC Use daily as needed.  ? Insulin Pen Needle 31G X 8 MM MISC Use to inject insulin one time daily  ? levothyroxine (SYNTHROID) 100 MCG tablet Take 100 mcg by mouth daily before breakfast.  ? loratadine (CLARITIN) 10 MG tablet Take 1 tablet (10 mg total) by mouth daily.  ? ONETOUCH ULTRA test strip USE TO TEST BLOOD SUGAR 4 TIMES DAILY AS DIRECTED.  ? pravastatin (PRAVACHOL)  40 MG tablet TAKE (1) TABLET BY MOUTH AT BEDTIME.  ? pregabalin (LYRICA) 100 MG capsule Take 100 mg by mouth 3 (three) times daily.  ? traMADol (ULTRAM) 50 MG tablet TAKE 1 TABLET EVERY 8 HOURS AS NEEDED FOR MODERATE TO SEVERE PAIN.  ? triamcinolone cream (KENALOG) 0.1 % APPLY TO AFFECTED AREAS TWICE DAILY.  ? [DISCONTINUED] acetaminophen (TYLENOL) 325 MG tablet Take 2 tablets (650 mg total) by mouth every 6 (six) hours as needed for mild pain or headache.  ? [DISCONTINUED] Artificial Tear Ointment (ARTIFICIAL TEARS) ointment Place  1 drop into both eyes as needed (for grey eye disease).  ? [DISCONTINUED] pravastatin (PRAVACHOL) 40 MG tablet TAKE (1) TABLET BY MOUTH AT BEDTIME.  ? ?No facility-administered encounter medications on file as of 04/30/2021.  ? ? ?Allergies (verified) ?Procardia [nifedipine], Aspirin, and Diltiazem  ? ?History: ?Past Medical History:  ?Diagnosis Date  ? Asthma   ? prn neb. and inhaler  ? Breast cancer ([place]) 01/2013  ? right  ? Dehydration 04/13/2013  ? GERD (gastroesophageal reflux disease)   ? Graves' disease   ? Hyperlipidemia   ? Hypertension   ? on multiple meds., has been on med. > 14 yr.  ? Non-insulin dependent type 2 diabetes mellitus ([place])   ? Obesity   ? Ophthalmic manifestation of Graves disease   ? Osteoarthritis 2003  ? left knee  ? Radiation 10/11/13-11/29/13  ? Right Breast  ? Sleep apnea 4/16  ? mod  ? Wears glasses   ? ?Past Surgical History:  ?Procedure Laterality Date  ? CARDIOVERSION N/A 11/08/2013  ? Procedure: CARDIOVERSION;  Surgeon: Lelon Perla, MD;  Location: [facility] ENDOSCOPY;  Service: Cardiovascular;  Laterality: N/A;  ? COLONOSCOPY  2007  ? COLONOSCOPY W/ POLYPECTOMY  2009  ? EXCISION OF KELOID N/A 05/18/2014  ? Procedure: EXCISION OF KELOID;  Surgeon: Erroll Luna, MD;  Location: [place];  Service: General;  Laterality: N/A;  ? PORT-A-CATH REMOVAL Right 05/18/2014  ? Procedure: REMOVAL PORT-A-CATH;  Surgeon: Erroll Luna, MD;  Location: [place];  Service: General;  Laterality: Right;  ? PORTACATH PLACEMENT Right 02/17/2013  ? Procedure: INSERTION PORT-A-CATH;  Surgeon: Joyice Faster. Cornett, MD;  Location: [place];  Service: General;  Laterality: Right;  ? TEE WITHOUT CARDIOVERSION N/A 11/08/2013  ? Procedure: TRANSESOPHAGEAL ECHOCARDIOGRAM (TEE);  Surgeon: Lelon Perla, MD;  Location: [place];  Service: Cardiovascular;  Laterality: N/A;  ? TOTAL KNEE ARTHROPLASTY Left 2003  ? TOTAL THYROIDECTOMY    ? TUBAL LIGATION  1985  ? ?Family  History  ?Problem Relation Age of Onset  ? Diabetes Mother   ? Heart disease Father   ? Lung cancer Father   ? Diabetes Sister   ? Hypertension Sister   ? Asthma Sister   ? Rheum arthritis Daughter   ? Hypertension Daughter   ? Fibromyalgia Daughter   ? Hypertension Daughter   ? ?Social History  ? ?Socioeconomic History  ? Marital status: Divorced  ?  Spouse name: Not on file  ? Number of children: 2  ? Years of education: Not on file  ? Highest education level: Not on file  ?Occupational History  ? Occupation: Disability  ?Tobacco Use  ? Smoking status: Former  ?  Packs/day: 0.25  ?  Years: 20.00  ?  Pack years: 5.00  ?  Types: Cigarettes  ?  Quit date: 02/10/1991  ?  Years since quitting: 30.2  ?  Passive  exposure: Never  ? Smokeless tobacco: Never  ?Vaping Use  ? Vaping Use: Never used  ?Substance and Sexual Activity  ? Alcohol use: No  ? Drug use: No  ? Sexual activity: Never  ?  Birth control/protection: Surgical  ?Other Topics Concern  ? Not on file  ?Social History Narrative  ? Lives in [place]  ?   ?   ? ?Social Determinants of Health  ? ?Financial Resource Strain: Low Risk   ? Difficulty of Paying Living Expenses: Not hard at all  ?Food Insecurity: No Food Insecurity  ? Worried About Charity fundraiser in the Last Year: Never true  ? Ran Out of Food in the Last Year: Never true  ?Transportation Needs: No Transportation Needs  ? Lack of Transportation (Medical): No  ? Lack of Transportation (Non-Medical): No  ?Physical Activity: Inactive  ? Days of Exercise per Week: 0 days  ? Minutes of Exercise per Session: 0 min  ?Stress: No Stress Concern Present  ? Feeling of Stress : Not at all  ?Social Connections: Moderately Isolated  ? Frequency of Communication with Friends and Family: Once a week  ? Frequency of Social Gatherings with Friends and Family: More than three times a week  ? Attends Religious Services: More than 4 times per year  ? Active Member of Clubs or Organizations: No  ? Attends Theatre manager Meetings: Never  ? Marital Status: Divorced  ? ? ?Tobacco Counseling ?Counseling given: Not Answered ? ? ?Clinical Intake: ? ?Pre-visit preparation completed: Yes ? ?Pain : 0-10 ?Pain Score: 7  ?P

## 2021-04-30 NOTE — Patient Instructions (Signed)
[name] , ?Thank you for taking time to come for your Medicare Wellness Visit. I appreciate your ongoing commitment to your health goals. Please review the following plan we discussed and let me know if I can assist you in the future.  ? ?Screening recommendations/referrals: ?Colonoscopy: Declined. ?Mammogram: Order placed today. ?Bone Density: Discussed. Done 01/17/2014 Repeat every 2 years ? ?Recommended yearly ophthalmology/optometry visit for glaucoma screening and checkup ?Recommended yearly dental visit for hygiene and checkup ? ?Vaccinations: ?Influenza vaccine: Done 10/31/2020 Repeat annually ? ?Pneumococcal vaccine: Done 12/12/2004 and 12/03/2017 ?Tdap vaccine: Due Repeat in 10 years ? ?Shingles vaccine: Advised by Endocrinologist to not receive vaccine.   ?Covid-19:Declined. ? ?Advanced directives: Please bring a copy of your health care power of attorney and living will to the office to be added to your chart at your convenience. ? ? ?Conditions/risks identified: Aim for 30 minutes of exercise, 6-8 glasses of water, and 5 servings of fruits and vegetables each day. ? ? ?Next appointment: Follow up in one year for your annual wellness visit 2024 ? ? ? ?Preventive Care 67 Years and Older, Female ?Preventive care refers to lifestyle choices and visits with your health care provider that can promote health and wellness. ?What does preventive care include? ?A yearly physical exam. This is also called an annual well check. ?Dental exams once or twice a year. ?Routine eye exams. Ask your health care provider how often you should have your eyes checked. ?Personal lifestyle choices, including: ?Daily care of your teeth and gums. ?Regular physical activity. ?Eating a healthy diet. ?Avoiding tobacco and drug use. ?Limiting alcohol use. ?Practicing safe sex. ?Taking low-dose aspirin every day. ?Taking vitamin and mineral supplements as recommended by your health care provider. ?What happens during an annual well  check? ?The services and screenings done by your health care provider during your annual well check will depend on your age, overall health, lifestyle risk factors, and family history of disease. ?Counseling  ?Your health care provider may ask you questions about your: ?Alcohol use. ?Tobacco use. ?Drug use. ?Emotional well-being. ?Home and relationship well-being. ?Sexual activity. ?Eating habits. ?History of falls. ?Memory and ability to understand (cognition). ?Work and work Statistician. ?Reproductive health. ?Screening  ?You may have the following tests or measurements: ?Height, weight, and BMI. ?Blood pressure. ?Lipid and cholesterol levels. These may be checked every 5 years, or more frequently if you are over 35 years old. ?Skin check. ?Lung cancer screening. You may have this screening every year starting at age 45 if you have a 30-pack-year history of smoking and currently smoke or have quit within the past 15 years. ?Fecal occult blood test (FOBT) of the stool. You may have this test every year starting at age 36. ?Flexible sigmoidoscopy or colonoscopy. You may have a sigmoidoscopy every 5 years or a colonoscopy every 10 years starting at age 47. ?Hepatitis C blood test. ?Hepatitis B blood test. ?Sexually transmitted disease (STD) testing. ?Diabetes screening. This is done by checking your blood sugar (glucose) after you have not eaten for a while (fasting). You may have this done every 1-3 years. ?Bone density scan. This is done to screen for osteoporosis. You may have this done starting at age 18. ?Mammogram. This may be done every 1-2 years. Talk to your health care provider about how often you should have regular mammograms. ?Talk with your health care provider about your test results, treatment options, and if necessary, the need for more tests. ?Vaccines  ?Your health care provider may  recommend certain vaccines, such as: ?Influenza vaccine. This is recommended every year. ?Tetanus, diphtheria, and  acellular pertussis (Tdap, Td) vaccine. You may need a Td booster every 10 years. ?Zoster vaccine. You may need this after age 67. ?Pneumococcal 13-valent conjugate (PCV13) vaccine. One dose is recommended after age 80. ?Pneumococcal polysaccharide (PPSV23) vaccine. One dose is recommended after age 4. ?Talk to your health care provider about which screenings and vaccines you need and how often you need them. ?This information is not intended to replace advice given to you by your health care provider. Make sure you discuss any questions you have with your health care provider. ?Document Released: 01/26/2015 Document Revised: 09/19/2015 Document Reviewed: 10/31/2014 ?Elsevier Interactive Patient Education ? 2017 [place]. ? ?Fall Prevention in the Home ?Falls can cause injuries. They can happen to people of all ages. There are many things you can do to make your home safe and to help prevent falls. ?What can I do on the outside of my home? ?Regularly fix the edges of walkways and driveways and fix any cracks. ?Remove anything that might make you trip as you walk through a door, such as a raised step or threshold. ?Trim any bushes or trees on the path to your home. ?Use bright outdoor lighting. ?Clear any walking paths of anything that might make someone trip, such as rocks or tools. ?Regularly check to see if handrails are loose or broken. Make sure that both sides of any steps have handrails. ?Any raised decks and porches should have guardrails on the edges. ?Have any leaves, snow, or ice cleared regularly. ?Use sand or salt on walking paths during winter. ?Clean up any spills in your garage right away. This includes oil or grease spills. ?What can I do in the bathroom? ?Use night lights. ?Install grab bars by the toilet and in the tub and shower. Do not use towel bars as grab bars. ?Use non-skid mats or decals in the tub or shower. ?If you need to sit down in the shower, use a plastic, non-slip stool. ?Keep  the floor dry. Clean up any water that spills on the floor as soon as it happens. ?Remove soap buildup in the tub or shower regularly. ?Attach bath mats securely with double-sided non-slip rug tape. ?Do not have throw rugs and other things on the floor that can make you trip. ?What can I do in the bedroom? ?Use night lights. ?Make sure that you have a light by your bed that is easy to reach. ?Do not use any sheets or blankets that are too big for your bed. They should not hang down onto the floor. ?Have a firm chair that has side arms. You can use this for support while you get dressed. ?Do not have throw rugs and other things on the floor that can make you trip. ?What can I do in the kitchen? ?Clean up any spills right away. ?Avoid walking on wet floors. ?Keep items that you use a lot in easy-to-reach places. ?If you need to reach something above you, use a strong step stool that has a grab bar. ?Keep electrical cords out of the way. ?Do not use floor polish or wax that makes floors slippery. If you must use wax, use non-skid floor wax. ?Do not have throw rugs and other things on the floor that can make you trip. ?What can I do with my stairs? ?Do not leave any items on the stairs. ?Make sure that there are handrails on both sides of  the stairs and use them. Fix handrails that are broken or loose. Make sure that handrails are as long as the stairways. ?Check any carpeting to make sure that it is firmly attached to the stairs. Fix any carpet that is loose or worn. ?Avoid having throw rugs at the top or bottom of the stairs. If you do have throw rugs, attach them to the floor with carpet tape. ?Make sure that you have a light switch at the top of the stairs and the bottom of the stairs. If you do not have them, ask someone to add them for you. ?What else can I do to help prevent falls? ?Wear shoes that: ?Do not have high heels. ?Have rubber bottoms. ?Are comfortable and fit you well. ?Are closed at the toe. Do not  wear sandals. ?If you use a stepladder: ?Make sure that it is fully opened. Do not climb a closed stepladder. ?Make sure that both sides of the stepladder are locked into place. ?Ask someone to hold it for y

## 2021-05-13 ENCOUNTER — Other Ambulatory Visit: Payer: Self-pay | Admitting: Family Medicine

## 2021-05-13 DIAGNOSIS — M25562 Pain in left knee: Secondary | ICD-10-CM | POA: Diagnosis not present

## 2021-05-13 DIAGNOSIS — I4892 Unspecified atrial flutter: Secondary | ICD-10-CM

## 2021-05-13 DIAGNOSIS — M25561 Pain in right knee: Secondary | ICD-10-CM

## 2021-05-13 DIAGNOSIS — M170 Bilateral primary osteoarthritis of knee: Secondary | ICD-10-CM | POA: Diagnosis not present

## 2021-05-13 DIAGNOSIS — I10 Essential (primary) hypertension: Secondary | ICD-10-CM

## 2021-05-17 ENCOUNTER — Ambulatory Visit: Payer: Self-pay

## 2021-05-17 ENCOUNTER — Ambulatory Visit: Payer: Medicare Other | Admitting: Orthopedic Surgery

## 2021-05-17 ENCOUNTER — Ambulatory Visit (INDEPENDENT_AMBULATORY_CARE_PROVIDER_SITE_OTHER): Payer: Medicare Other

## 2021-05-17 VITALS — Ht 64.5 in | Wt 321.0 lb

## 2021-05-17 DIAGNOSIS — M25561 Pain in right knee: Secondary | ICD-10-CM | POA: Diagnosis not present

## 2021-05-17 DIAGNOSIS — M25562 Pain in left knee: Secondary | ICD-10-CM | POA: Diagnosis not present

## 2021-05-17 DIAGNOSIS — M170 Bilateral primary osteoarthritis of knee: Secondary | ICD-10-CM | POA: Diagnosis not present

## 2021-05-19 ENCOUNTER — Encounter: Payer: Self-pay | Admitting: Orthopedic Surgery

## 2021-05-19 MED ORDER — BUPIVACAINE HCL 0.25 % IJ SOLN
4.0000 mL | INTRAMUSCULAR | Status: AC | PRN
  Administered 2021-05-13: 4 mL via INTRA_ARTICULAR

## 2021-05-19 MED ORDER — METHYLPREDNISOLONE ACETATE 40 MG/ML IJ SUSP
40.0000 mg | INTRAMUSCULAR | Status: AC | PRN
  Administered 2021-05-13: 40 mg via INTRA_ARTICULAR

## 2021-05-19 MED ORDER — LIDOCAINE HCL 1 % IJ SOLN
5.0000 mL | INTRAMUSCULAR | Status: AC | PRN
  Administered 2021-05-13: 5 mL

## 2021-05-19 NOTE — Progress Notes (Signed)
? ?Office Visit Note ?  ?Patient: [name]           ?Date of Birth: 1955-04-16           ?MRN: [id number] ?Visit Date: 05/17/2021 ?Requested by: Bo Merino, MD ?[address] ?[address] ?[place],  [place] [id number] ?PCP: Coral Spikes, DO ? ?Subjective: ?Chief Complaint  ?Patient presents with  ? Left Knee - Pain  ? Right Knee - Pain  ? ? ?HPI: Cutright is a 66 year old patient with bilateral knee pain.  Right hurts more than left.  She had left total knee replacement over 20 years ago done in town by another group.  She reports the left leg is stuck in extension.  Had manipulation after the surgery.  Difficulty with ambulation.  Sits in a chair at home.  She does take Eliquis and does have diabetes.  BMI is over 40.  Has a history of steroid shots in the right knee which helps her for "a little while".  She does not want to pursue right knee replacement based on her experience with her left knee. ?             ?ROS: All systems reviewed are negative as they relate to the chief complaint within the history of present illness.  Patient denies  fevers or chills. ? ? ?Assessment & Plan: ?Visit Diagnoses:  ?1. Pain in both knees, unspecified chronicity   ? ? 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  Has only about 20 degrees of motion.  Has some heterotopic ossification within the suprapatella pouch.  Getting that knee moving would be a relatively huge undertaking in terms of performing ajudet quadricepsplasty and possible revision of the components.  Patient would like to have right knee injected today for temporary pain relief.  We will see her back as needed ? ?Follow-Up Instructions: Return if symptoms worsen or fail to improve.  ? ?Orders:  ?Orders Placed This Encounter  ?Procedures  ? XR Knee 1-2 Views Right  ? XR KNEE 3 VIEW LEFT  ? ?No orders of the defined types were placed in this encounter. ? ? ? ? Procedures: ?Large Joint Inj: R knee on  05/13/2021 11:00 PM ?Indications: diagnostic evaluation, joint swelling and pain ?Details: 18 G 1.5 in needle, superolateral approach ? ?Arthrogram: No ? ?Medications: 5 mL lidocaine 1 %; 40 mg methylPREDNISolone acetate 40 MG/ML; 4 mL bupivacaine 0.25 % ?Outcome: tolerated well, no immediate complications ?Procedure, treatment alternatives, risks and benefits explained, specific risks discussed. Consent was given by the patient. Immediately prior to procedure a time out was called to verify the correct patient, procedure, equipment, support staff and site/side marked as required. Patient was prepped and draped in the usual sterile fashion.  ? ? ? ? ?Clinical Data: ?No additional findings. ? ?Objective: ?Vital Signs: Ht 5' 4.5" (1.638 m)   Wt (!) 321 lb (145.6 kg)   BMI 54.25 kg/m?  ? ?Physical Exam:  ? ?Constitutional: Patient appears well-developed ?HEENT:  ?Head: Normocephalic ?Eyes:EOM are normal ?Neck: Normal range of motion ?Cardiovascular: Normal rate ?Pulmonary/chest: Effort normal ?Neurologic: Patient is alert ?Skin: Skin is warm ?Psychiatric: Patient has normal mood and affect ? ? ?Ortho Exam: Ortho exam demonstrates range of motion of the right knee 5-60.  Left knee 5 to 20 degrees.  Pedal pulses palpable.  Extensor mechanism intact bilaterally.  No groin pain with internal and external rotation of either leg. ? ?Specialty Comments:  ?  No specialty comments available. ? ?Imaging: ?No results found. ? ? ?PMFS History: ?Patient Active Problem List  ? Diagnosis Date Noted  ? Diabetic neuropathy ([place]) 04/30/2021  ? Primary osteoarthritis of both hands 01/11/2021  ? Arthritis, multiple joint involvement 10/31/2020  ? Healthcare maintenance 10/31/2020  ? Depression 05/24/2015  ? OSA (obstructive sleep apnea) 04/04/2014  ? GERD (gastroesophageal reflux disease) 12/29/2013  ? Mitral regurgitation 11/08/2013  ? Atrial flutter ([place]) 11/05/2013  ? Type 2 diabetes mellitus with hyperglycemia ([place]) 09/15/2013  ? CKD  (chronic kidney disease) stage 2, GFR 60-89 ml/min 05/08/2013  ? Hypothyroidism 05/04/2013  ? Malignant neoplasm of upper-outer quadrant of right breast in female, estrogen receptor positive ([place]) 01/31/2013  ? Knee stiffness 10/04/2012  ? S/P total knee replacement 10/04/2012  ? Graves' eye disease 05/20/2011  ? Osteoarthritis   ? Asthma 12/08/2010  ? Obesity   ? Hyperlipidemia 10/16/2010  ? Hypertension   ? Diabetes mellitus, type 2 ([place])   ? Tobacco abuse, in remission   ? ?Past Medical History:  ?Diagnosis Date  ? Asthma   ? prn neb. and inhaler  ? Breast cancer ([place]) 01/2013  ? right  ? Dehydration 04/13/2013  ? GERD (gastroesophageal reflux disease)   ? Graves' disease   ? Hyperlipidemia   ? Hypertension   ? on multiple meds., has been on med. > 14 yr.  ? Non-insulin dependent type 2 diabetes mellitus ([place])   ? Obesity   ? Ophthalmic manifestation of Graves disease   ? Osteoarthritis 2003  ? left knee  ? Radiation 10/11/13-11/29/13  ? Right Breast  ? Sleep apnea 4/16  ? mod  ? Wears glasses   ?  ?Family History  ?Problem Relation Age of Onset  ? Diabetes Mother   ? Heart disease Father   ? Lung cancer Father   ? Diabetes Sister   ? Hypertension Sister   ? Asthma Sister   ? Rheum arthritis Daughter   ? Hypertension Daughter   ? Fibromyalgia Daughter   ? Hypertension Daughter   ?  ?Past Surgical History:  ?Procedure Laterality Date  ? CARDIOVERSION N/A 11/08/2013  ? Procedure: CARDIOVERSION;  Surgeon: Lelon Perla, MD;  Location: [facility] ENDOSCOPY;  Service: Cardiovascular;  Laterality: N/A;  ? COLONOSCOPY  2007  ? COLONOSCOPY W/ POLYPECTOMY  2009  ? EXCISION OF KELOID N/A 05/18/2014  ? Procedure: EXCISION OF KELOID;  Surgeon: Erroll Luna, MD;  Location: [facility];  Service: General;  Laterality: N/A;  ? PORT-A-CATH REMOVAL Right 05/18/2014  ? Procedure: REMOVAL PORT-A-CATH;  Surgeon: Erroll Luna, MD;  Location: [facility];  Service: General;  Laterality: Right;  ? PORTACATH PLACEMENT  Right 02/17/2013  ? Procedure: INSERTION PORT-A-CATH;  Surgeon: Joyice Faster. Cornett, MD;  Location: [facility];  Service: General;  Laterality: Right;  ? TEE WITHOUT CARDIOVERSION N/A 11/08/2013  ? Procedure: TRANSESOPHAGEAL ECHOCARDIOGRAM (TEE);  Surgeon: Lelon Perla, MD;  Location: [facility];  Service: Cardiovascular;  Laterality: N/A;  ? TOTAL KNEE ARTHROPLASTY Left 2003  ? TOTAL THYROIDECTOMY    ? TUBAL LIGATION  1985  ? ?Social History  ? ?Occupational History  ? Occupation: Disability  ?Tobacco Use  ? Smoking status: Former  ?  Packs/day: 0.25  ?  Years: 20.00  ?  Pack years: 5.00  ?  Types: Cigarettes  ?  Quit date: 02/10/1991  ?  Years since quitting: 30.2  ?  Passive exposure: Never  ? Smokeless tobacco: Never  ?  Vaping Use  ? Vaping Use: Never used  ?Substance and Sexual Activity  ? Alcohol use: No  ? Drug use: No  ? Sexual activity: Never  ?  Birth control/protection: Surgical  ? ? ? ? ? ?

## 2021-05-30 ENCOUNTER — Ambulatory Visit (INDEPENDENT_AMBULATORY_CARE_PROVIDER_SITE_OTHER): Payer: Medicare Other | Admitting: Family Medicine

## 2021-05-30 ENCOUNTER — Encounter: Payer: Self-pay | Admitting: Family Medicine

## 2021-05-30 VITALS — BP 138/82 | HR 78 | Temp 98.3°F | Wt 292.0 lb

## 2021-05-30 DIAGNOSIS — M129 Arthropathy, unspecified: Secondary | ICD-10-CM

## 2021-05-30 DIAGNOSIS — Z794 Long term (current) use of insulin: Secondary | ICD-10-CM | POA: Diagnosis not present

## 2021-05-30 DIAGNOSIS — E785 Hyperlipidemia, unspecified: Secondary | ICD-10-CM

## 2021-05-30 DIAGNOSIS — I10 Essential (primary) hypertension: Secondary | ICD-10-CM | POA: Diagnosis not present

## 2021-05-30 DIAGNOSIS — E1122 Type 2 diabetes mellitus with diabetic chronic kidney disease: Secondary | ICD-10-CM | POA: Diagnosis not present

## 2021-05-30 DIAGNOSIS — E039 Hypothyroidism, unspecified: Secondary | ICD-10-CM | POA: Diagnosis not present

## 2021-05-30 MED ORDER — TRAMADOL HCL 50 MG PO TABS: ORAL_TABLET | ORAL | 0 refills | Status: DC

## 2021-05-30 NOTE — Assessment & Plan Note (Signed)
Has been stable.  Continue close follow-up with Endo.  Continue current dosing of insulin and Trulicity.  A1c today.

## 2021-05-30 NOTE — Patient Instructions (Signed)
Labs when you leave.  Follow up in 6 months.  Take care  Dr. Lacinda Axon

## 2021-05-30 NOTE — Progress Notes (Signed)
Subjective:  Patient ID: [name], female    DOB: August 01, 1955  Age: 66 y.o. MRN: [id number]  CC: Chief Complaint  Patient presents with   Hypertension    No issues at this time    HPI:  66 year old female presents for follow-up.  Patient states that she is feeling well.  She is daily with ongoing joint pain secondary to arthritis.  No chest pain.  She has some baseline shortness of breath due to asthma.  Patient has multiple comorbidities.  Patient's diabetes is managed by endocrinology.  Needs A1c.  She states that her blood sugars have been well controlled.  She is on Humalog 75/25 12 units twice daily and Trulicity.  Hypertension is stable on amlodipine, benazepril, carvedilol.  Patient has been followed by rheumatology as well as orthopedics regarding ongoing joint pains.  She uses tramadol as needed for moderate to severe pain.  We will need a refill in the near future.  Lipids have been stable.  She is on pravastatin and tolerating.  Patient Active Problem List   Diagnosis Date Noted   Diabetic neuropathy ([place]) 04/30/2021   Arthritis, multiple joint involvement 10/31/2020   Healthcare maintenance 10/31/2020   Depression 05/24/2015   OSA (obstructive sleep apnea) 04/04/2014   GERD (gastroesophageal reflux disease) 12/29/2013   Mitral regurgitation 11/08/2013   Atrial flutter ([place]) 11/05/2013   Type 2 diabetes mellitus with hyperglycemia ([place]) 09/15/2013   CKD (chronic kidney disease) stage 2, GFR 60-89 ml/min 05/08/2013   Hypothyroidism 05/04/2013   Malignant neoplasm of upper-outer quadrant of right breast in female, estrogen receptor positive ([place]) 01/31/2013   Asthma 12/08/2010   Hyperlipidemia 10/16/2010   Hypertension    Diabetes mellitus, type 2 ([facility])    Tobacco abuse, in remission     Social Hx   Social History   Socioeconomic History   Marital status: Divorced    Spouse name: Not on file   Number of children: 2   Years of education: Not on  file   Highest education level: Not on file  Occupational History   Occupation: Disability  Tobacco Use   Smoking status: Former    Packs/day: 0.25    Years: 20.00    Pack years: 5.00    Types: Cigarettes    Quit date: 02/10/1991    Years since quitting: 30.3    Passive exposure: Never   Smokeless tobacco: Never  Vaping Use   Vaping Use: Never used  Substance and Sexual Activity   Alcohol use: No   Drug use: No   Sexual activity: Never    Birth control/protection: Surgical  Other Topics Concern   Not on file  Social History Narrative   Lives in [place] Resource Strain: Low Risk    Difficulty of Paying Living Expenses: Not hard at all  Food Insecurity: No Food Insecurity   Worried About Charity fundraiser in the Last Year: Never true   [place] in the Last Year: Never true  Transportation Needs: No Transportation Needs   Lack of Transportation (Medical): No   Lack of Transportation (Non-Medical): No  Physical Activity: Inactive   Days of Exercise per Week: 0 days   Minutes of Exercise per Session: 0 min  Stress: No Stress Concern Present   Feeling of Stress : Not at all  Social Connections: Moderately Isolated   Frequency of Communication with Friends and Family:  Once a week   Frequency of Social Gatherings with Friends and Family: More than three times a week   Attends Religious Services: More than 4 times per year   Active Member of [employer] or Organizations: No   Attends Music therapist: Never   Marital Status: Divorced    Review of Systems Per HPI  Objective:  BP 138/82   Pulse 78   Temp 98.3 F (36.8 C)   Wt 292 lb (132.5 kg) Comment: pt reported  SpO2 98%   BMI 49.35 kg/m      05/30/2021   10:31 AM 05/17/2021   10:24 AM 04/30/2021    3:04 PM  BP/Weight  Systolic BP 326    Diastolic BP 82    Wt. (Lbs) 292 321 327  BMI 49.35 kg/m2 54.25 kg/m2 55.26 kg/m2    Physical  Exam Vitals and nursing note reviewed.  Constitutional:      Appearance: Normal appearance. She is obese.  HENT:     Head: Normocephalic and atraumatic.  Eyes:     General:        Right eye: No discharge.        Left eye: No discharge.     Conjunctiva/sclera: Conjunctivae normal.  Cardiovascular:     Rate and Rhythm: Normal rate and regular rhythm.  Pulmonary:     Effort: Pulmonary effort is normal.     Breath sounds: Normal breath sounds. No wheezing or rales.  Abdominal:     Palpations: Abdomen is soft.     Tenderness: There is no abdominal tenderness.  Neurological:     Mental Status: She is alert.  Psychiatric:        Mood and Affect: Mood normal.        Behavior: Behavior normal.    Lab Results  Component Value Date   WBC 5.9 05/15/2020   HGB 12.4 05/15/2020   HCT 39.3 05/15/2020   PLT 240 05/15/2020   GLUCOSE 160 (H) 05/15/2020   CHOL 158 05/15/2020   TRIG 87 05/15/2020   HDL 67 05/15/2020   LDLCALC 75 05/15/2020   ALT 18 05/15/2020   AST 20 05/15/2020   NA 147 (H) 05/15/2020   K 4.1 05/15/2020   CL 104 05/15/2020   CREATININE 0.98 05/15/2020   BUN 12 05/15/2020   CO2 22 05/15/2020   TSH 2.640 06/08/2019   INR 1.29 09/07/2013   HGBA1C 7.0 (H) 05/15/2020     Assessment & Plan:   Problem List Items Addressed This Visit       Cardiovascular and Mediastinum   Hypertension    BP stable.  Continue current medications.         Endocrine   Diabetes mellitus, type 2 ([place]) - Primary    Has been stable.  Continue close follow-up with Endo.  Continue current dosing of insulin and Trulicity.  A1c today.       Relevant Orders   CBC   CMP14+EGFR   Hemoglobin A1c   Microalbumin / creatinine urine ratio   Hypothyroidism    TSH today to reassess.       Relevant Orders   TSH     Musculoskeletal and Integument   Arthritis, multiple joint involvement   Relevant Medications   traMADol (ULTRAM) 50 MG tablet     Other   Hyperlipidemia    Lipid  panel today.  Continue pravastatin.       Relevant Orders   Lipid panel    Follow-up:  Return in about 6 months (around 11/30/2021).  [place]

## 2021-05-30 NOTE — Assessment & Plan Note (Signed)
Lipid panel today.  Continue pravastatin.

## 2021-05-30 NOTE — Assessment & Plan Note (Signed)
BP stable.  Continue current medications. 

## 2021-05-30 NOTE — Assessment & Plan Note (Signed)
TSH today to reassess.

## 2021-05-31 LAB — TSH: TSH: 1.33 u[IU]/mL (ref 0.450–4.500)

## 2021-06-11 ENCOUNTER — Other Ambulatory Visit: Payer: Self-pay | Admitting: Family Medicine

## 2021-06-11 DIAGNOSIS — I4892 Unspecified atrial flutter: Secondary | ICD-10-CM

## 2021-06-18 LAB — CBC
Hematocrit: 38.9 % (ref 34.0–46.6)
Hemoglobin: 12.8 g/dL (ref 11.1–15.9)
MCH: 28.4 pg (ref 26.6–33.0)
MCHC: 32.9 g/dL (ref 31.5–35.7)
MCV: 86 fL (ref 79–97)
Platelets: 252 10*3/uL (ref 150–450)
RBC: 4.5 x10E6/uL (ref 3.77–5.28)
RDW: 15.2 % (ref 11.7–15.4)
WBC: 5.4 10*3/uL (ref 3.4–10.8)

## 2021-06-18 LAB — HEMOGLOBIN A1C
Est. average glucose Bld gHb Est-mCnc: 166 mg/dL
Hgb A1c MFr Bld: 7.4 % — ABNORMAL HIGH (ref 4.8–5.6)

## 2021-06-18 LAB — CMP14+EGFR
ALT: 24 IU/L (ref 0–32)
AST: 24 IU/L (ref 0–40)
Albumin/Globulin Ratio: 1.4 (ref 1.2–2.2)
Albumin: 4.5 g/dL (ref 3.8–4.8)
Alkaline Phosphatase: 88 IU/L (ref 44–121)
BUN/Creatinine Ratio: 14 (ref 12–28)
BUN: 16 mg/dL (ref 8–27)
Bilirubin Total: 0.3 mg/dL (ref 0.0–1.2)
CO2: 23 mmol/L (ref 20–29)
Calcium: 9.3 mg/dL (ref 8.7–10.3)
Chloride: 103 mmol/L (ref 96–106)
Creatinine, Ser: 1.13 mg/dL — ABNORMAL HIGH (ref 0.57–1.00)
Globulin, Total: 3.2 g/dL (ref 1.5–4.5)
Glucose: 160 mg/dL — ABNORMAL HIGH (ref 70–99)
Potassium: 4.2 mmol/L (ref 3.5–5.2)
Sodium: 143 mmol/L (ref 134–144)
Total Protein: 7.7 g/dL (ref 6.0–8.5)
eGFR: 54 mL/min/{1.73_m2} — ABNORMAL LOW (ref >59)

## 2021-06-18 LAB — MICROALBUMIN / CREATININE URINE RATIO

## 2021-06-18 LAB — LIPID PANEL
Chol/HDL Ratio: 2.1 ratio (ref 0.0–4.4)
Cholesterol, Total: 157 mg/dL (ref 100–199)
HDL: 74 mg/dL (ref >39)
LDL Chol Calc (NIH): 70 mg/dL (ref 0–99)
Triglycerides: 63 mg/dL (ref 0–149)
VLDL Cholesterol Cal: 13 mg/dL (ref 5–40)

## 2021-07-08 ENCOUNTER — Other Ambulatory Visit: Payer: Self-pay | Admitting: Family Medicine

## 2021-07-08 DIAGNOSIS — M129 Arthropathy, unspecified: Secondary | ICD-10-CM

## 2021-07-08 DIAGNOSIS — I10 Essential (primary) hypertension: Secondary | ICD-10-CM

## 2021-07-29 ENCOUNTER — Other Ambulatory Visit: Payer: Self-pay | Admitting: Family Medicine

## 2021-07-29 DIAGNOSIS — E785 Hyperlipidemia, unspecified: Secondary | ICD-10-CM

## 2021-08-12 ENCOUNTER — Other Ambulatory Visit: Payer: Self-pay | Admitting: Family Medicine

## 2021-08-12 DIAGNOSIS — I10 Essential (primary) hypertension: Secondary | ICD-10-CM

## 2021-08-19 ENCOUNTER — Telehealth: Payer: Self-pay | Admitting: Family Medicine

## 2021-08-19 ENCOUNTER — Ambulatory Visit: Payer: Medicare Other | Admitting: Orthopedic Surgery

## 2021-08-19 NOTE — Telephone Encounter (Signed)
Pt called and states that she is needing a script for a new CPAP machine. Script written out and placed on provider desk. Please advise. Thank you Phone number [phone] (will need to call to get fax number)

## 2021-08-21 ENCOUNTER — Other Ambulatory Visit: Payer: Self-pay | Admitting: Family Medicine

## 2021-08-21 DIAGNOSIS — M129 Arthropathy, unspecified: Secondary | ICD-10-CM

## 2021-08-23 ENCOUNTER — Ambulatory Visit: Payer: Medicare Other | Admitting: Nurse Practitioner

## 2021-08-26 ENCOUNTER — Encounter: Payer: Self-pay | Admitting: Nurse Practitioner

## 2021-08-26 ENCOUNTER — Ambulatory Visit: Payer: Medicare Other | Admitting: Nurse Practitioner

## 2021-08-26 VITALS — BP 147/73 | HR 89 | Temp 98.8°F | Wt 323.2 lb

## 2021-08-26 DIAGNOSIS — G4733 Obstructive sleep apnea (adult) (pediatric): Secondary | ICD-10-CM

## 2021-08-26 NOTE — Progress Notes (Signed)
   Subjective:    Patient ID: [name], female    DOB: Oct 18, 1955, 66 y.o.   MRN: [id number]  HPI 66 year old female patient with history of atrial flutter, obesity, mitral valve regurgitation, hypertension, sleep apnea, asthma, diabetes, Graves' disease, hypothyroidism, CKD stage III, presents to clinic for follow-up on CPAP prescription.  Pt states her CPAP is about to give out after 6.5 years.   Patient CPAP settings is at 4.   Patient states that she relies on her CPAP machine and that "she can not sleep without it"   Review of Systems  All other systems reviewed and are negative.      Objective:   Physical Exam Vitals reviewed.  Constitutional:      General: She is not in acute distress.    Appearance: Normal appearance. She is obese. She is not ill-appearing, toxic-appearing or diaphoretic.  HENT:     Head: Normocephalic and atraumatic.  Cardiovascular:     Rate and Rhythm: Normal rate and regular rhythm.     Pulses: Normal pulses.     Heart sounds: Normal heart sounds. No murmur heard. Pulmonary:     Effort: Pulmonary effort is normal. No respiratory distress.     Breath sounds: Normal breath sounds. No wheezing.  Musculoskeletal:     Comments: Grossly intact  Skin:    General: Skin is warm.     Capillary Refill: Capillary refill takes less than 2 seconds.  Neurological:     Mental Status: She is alert.     Comments: Grossly intact  Psychiatric:        Mood and Affect: Mood normal.        Behavior: Behavior normal.           Assessment & Plan:  1. OSA (obstructive sleep apnea) -Patient needing new CPAP machine. -Current CPAP settings is 4 -This office visit documentation should be faxed to [phone] -Return to clinic to follow-up with PCP as scheduled    Note:  This document was prepared using Dragon voice recognition software and may include unintentional dictation errors. Note - This record has been created using [employer].  Chart  creation errors have been sought, but may not always  have been located. Such creation errors do not reflect on  the standard of medical care.

## 2021-08-26 NOTE — Progress Notes (Signed)
08/26/21-office visit faxed to provided number.

## 2021-08-28 ENCOUNTER — Ambulatory Visit: Payer: Medicare Other | Admitting: Nurse Practitioner

## 2021-08-31 ENCOUNTER — Emergency Department (HOSPITAL_COMMUNITY): Payer: Medicare Other

## 2021-08-31 ENCOUNTER — Encounter (HOSPITAL_COMMUNITY): Payer: Self-pay

## 2021-08-31 ENCOUNTER — Inpatient Hospital Stay (HOSPITAL_COMMUNITY)
Admission: EM | Admit: 2021-08-31 | Discharge: 2021-09-09 | DRG: 392 | Disposition: A | Discharge status: Skilled Nursing Facility | Payer: Medicare Other | Attending: Family Medicine | Admitting: Family Medicine

## 2021-08-31 ENCOUNTER — Other Ambulatory Visit: Payer: Self-pay

## 2021-08-31 DIAGNOSIS — Z170 Estrogen receptor positive status [ER+]: Secondary | ICD-10-CM

## 2021-08-31 DIAGNOSIS — C50411 Malignant neoplasm of upper-outer quadrant of right female breast: Secondary | ICD-10-CM

## 2021-08-31 DIAGNOSIS — N390 Urinary tract infection, site not specified: Secondary | ICD-10-CM | POA: Diagnosis present

## 2021-08-31 DIAGNOSIS — D649 Anemia, unspecified: Secondary | ICD-10-CM | POA: Diagnosis not present

## 2021-08-31 DIAGNOSIS — R7401 Elevation of levels of liver transaminase levels: Secondary | ICD-10-CM

## 2021-08-31 DIAGNOSIS — Z8249 Family history of ischemic heart disease and other diseases of the circulatory system: Secondary | ICD-10-CM

## 2021-08-31 DIAGNOSIS — N179 Acute kidney failure, unspecified: Secondary | ICD-10-CM | POA: Diagnosis not present

## 2021-08-31 DIAGNOSIS — Z853 Personal history of malignant neoplasm of breast: Secondary | ICD-10-CM

## 2021-08-31 DIAGNOSIS — N189 Chronic kidney disease, unspecified: Secondary | ICD-10-CM | POA: Diagnosis not present

## 2021-08-31 DIAGNOSIS — M1711 Unilateral primary osteoarthritis, right knee: Secondary | ICD-10-CM | POA: Diagnosis not present

## 2021-08-31 DIAGNOSIS — R109 Unspecified abdominal pain: Secondary | ICD-10-CM | POA: Diagnosis not present

## 2021-08-31 DIAGNOSIS — N182 Chronic kidney disease, stage 2 (mild): Secondary | ICD-10-CM | POA: Diagnosis present

## 2021-08-31 DIAGNOSIS — I10 Essential (primary) hypertension: Secondary | ICD-10-CM | POA: Diagnosis present

## 2021-08-31 DIAGNOSIS — R278 Other lack of coordination: Secondary | ICD-10-CM | POA: Diagnosis not present

## 2021-08-31 DIAGNOSIS — Z7901 Long term (current) use of anticoagulants: Secondary | ICD-10-CM

## 2021-08-31 DIAGNOSIS — Z8679 Personal history of other diseases of the circulatory system: Secondary | ICD-10-CM | POA: Diagnosis not present

## 2021-08-31 DIAGNOSIS — I129 Hypertensive chronic kidney disease with stage 1 through stage 4 chronic kidney disease, or unspecified chronic kidney disease: Secondary | ICD-10-CM | POA: Diagnosis present

## 2021-08-31 DIAGNOSIS — G4733 Obstructive sleep apnea (adult) (pediatric): Secondary | ICD-10-CM | POA: Diagnosis present

## 2021-08-31 DIAGNOSIS — E1165 Type 2 diabetes mellitus with hyperglycemia: Secondary | ICD-10-CM | POA: Diagnosis present

## 2021-08-31 DIAGNOSIS — M129 Arthropathy, unspecified: Secondary | ICD-10-CM

## 2021-08-31 DIAGNOSIS — N281 Cyst of kidney, acquired: Secondary | ICD-10-CM | POA: Diagnosis not present

## 2021-08-31 DIAGNOSIS — K760 Fatty (change of) liver, not elsewhere classified: Secondary | ICD-10-CM | POA: Diagnosis present

## 2021-08-31 DIAGNOSIS — Z7401 Bed confinement status: Secondary | ICD-10-CM | POA: Diagnosis not present

## 2021-08-31 DIAGNOSIS — Z833 Family history of diabetes mellitus: Secondary | ICD-10-CM

## 2021-08-31 DIAGNOSIS — S83411A Sprain of medial collateral ligament of right knee, initial encounter: Secondary | ICD-10-CM | POA: Diagnosis not present

## 2021-08-31 DIAGNOSIS — L910 Hypertrophic scar: Secondary | ICD-10-CM | POA: Diagnosis present

## 2021-08-31 DIAGNOSIS — R509 Fever, unspecified: Secondary | ICD-10-CM | POA: Diagnosis not present

## 2021-08-31 DIAGNOSIS — B9620 Unspecified Escherichia coli [E. coli] as the cause of diseases classified elsewhere: Secondary | ICD-10-CM | POA: Diagnosis present

## 2021-08-31 DIAGNOSIS — Z96652 Presence of left artificial knee joint: Secondary | ICD-10-CM | POA: Diagnosis present

## 2021-08-31 DIAGNOSIS — E66813 Obesity, class 3: Secondary | ICD-10-CM | POA: Diagnosis present

## 2021-08-31 DIAGNOSIS — I700 Atherosclerosis of aorta: Secondary | ICD-10-CM | POA: Diagnosis not present

## 2021-08-31 DIAGNOSIS — M6281 Muscle weakness (generalized): Secondary | ICD-10-CM | POA: Diagnosis not present

## 2021-08-31 DIAGNOSIS — Z6841 Body Mass Index (BMI) 40.0 and over, adult: Secondary | ICD-10-CM | POA: Diagnosis not present

## 2021-08-31 DIAGNOSIS — R1012 Left upper quadrant pain: Secondary | ICD-10-CM | POA: Diagnosis not present

## 2021-08-31 DIAGNOSIS — J45909 Unspecified asthma, uncomplicated: Secondary | ICD-10-CM | POA: Diagnosis not present

## 2021-08-31 DIAGNOSIS — E785 Hyperlipidemia, unspecified: Secondary | ICD-10-CM | POA: Diagnosis present

## 2021-08-31 DIAGNOSIS — Z888 Allergy status to other drugs, medicaments and biological substances status: Secondary | ICD-10-CM

## 2021-08-31 DIAGNOSIS — Z794 Long term (current) use of insulin: Secondary | ICD-10-CM | POA: Diagnosis not present

## 2021-08-31 DIAGNOSIS — E0500 Thyrotoxicosis with diffuse goiter without thyrotoxic crisis or storm: Secondary | ICD-10-CM | POA: Diagnosis present

## 2021-08-31 DIAGNOSIS — Z87891 Personal history of nicotine dependence: Secondary | ICD-10-CM

## 2021-08-31 DIAGNOSIS — Z79899 Other long term (current) drug therapy: Secondary | ICD-10-CM

## 2021-08-31 DIAGNOSIS — Z923 Personal history of irradiation: Secondary | ICD-10-CM

## 2021-08-31 DIAGNOSIS — K219 Gastro-esophageal reflux disease without esophagitis: Secondary | ICD-10-CM | POA: Diagnosis present

## 2021-08-31 DIAGNOSIS — K8020 Calculus of gallbladder without cholecystitis without obstruction: Secondary | ICD-10-CM | POA: Diagnosis not present

## 2021-08-31 DIAGNOSIS — R531 Weakness: Secondary | ICD-10-CM | POA: Diagnosis not present

## 2021-08-31 DIAGNOSIS — E1169 Type 2 diabetes mellitus with other specified complication: Secondary | ICD-10-CM | POA: Insufficient documentation

## 2021-08-31 DIAGNOSIS — G8929 Other chronic pain: Secondary | ICD-10-CM | POA: Diagnosis present

## 2021-08-31 DIAGNOSIS — M25461 Effusion, right knee: Secondary | ICD-10-CM | POA: Diagnosis not present

## 2021-08-31 DIAGNOSIS — R29898 Other symptoms and signs involving the musculoskeletal system: Secondary | ICD-10-CM | POA: Diagnosis not present

## 2021-08-31 DIAGNOSIS — Z825 Family history of asthma and other chronic lower respiratory diseases: Secondary | ICD-10-CM

## 2021-08-31 DIAGNOSIS — K2900 Acute gastritis without bleeding: Secondary | ICD-10-CM | POA: Diagnosis not present

## 2021-08-31 DIAGNOSIS — E039 Hypothyroidism, unspecified: Secondary | ICD-10-CM | POA: Diagnosis present

## 2021-08-31 DIAGNOSIS — M7121 Synovial cyst of popliteal space [Baker], right knee: Secondary | ICD-10-CM | POA: Diagnosis not present

## 2021-08-31 DIAGNOSIS — Z79890 Hormone replacement therapy (postmenopausal): Secondary | ICD-10-CM

## 2021-08-31 DIAGNOSIS — R262 Difficulty in walking, not elsewhere classified: Secondary | ICD-10-CM | POA: Diagnosis not present

## 2021-08-31 DIAGNOSIS — R2689 Other abnormalities of gait and mobility: Secondary | ICD-10-CM | POA: Diagnosis not present

## 2021-08-31 DIAGNOSIS — E1122 Type 2 diabetes mellitus with diabetic chronic kidney disease: Secondary | ICD-10-CM | POA: Diagnosis present

## 2021-08-31 DIAGNOSIS — Z801 Family history of malignant neoplasm of trachea, bronchus and lung: Secondary | ICD-10-CM

## 2021-08-31 DIAGNOSIS — R1013 Epigastric pain: Secondary | ICD-10-CM | POA: Diagnosis not present

## 2021-08-31 DIAGNOSIS — R41841 Cognitive communication deficit: Secondary | ICD-10-CM | POA: Diagnosis not present

## 2021-08-31 DIAGNOSIS — E119 Type 2 diabetes mellitus without complications: Secondary | ICD-10-CM

## 2021-08-31 DIAGNOSIS — M25512 Pain in left shoulder: Secondary | ICD-10-CM | POA: Diagnosis present

## 2021-08-31 LAB — COMPREHENSIVE METABOLIC PANEL
ALT: 21 U/L (ref 0–44)
AST: 30 U/L (ref 15–41)
Albumin: 4.3 g/dL (ref 3.5–5.0)
Alkaline Phosphatase: 84 U/L (ref 38–126)
Anion gap: 12 (ref 5–15)
BUN: 14 mg/dL (ref 8–23)
CO2: 25 mmol/L (ref 22–32)
Calcium: 9.3 mg/dL (ref 8.9–10.3)
Chloride: 103 mmol/L (ref 98–111)
Creatinine, Ser: 0.97 mg/dL (ref 0.44–1.00)
GFR, Estimated: >60 mL/min (ref >60)
Glucose, Bld: 213 mg/dL — ABNORMAL HIGH (ref 70–99)
Potassium: 3.8 mmol/L (ref 3.5–5.1)
Sodium: 140 mmol/L (ref 135–145)
Total Bilirubin: 0.8 mg/dL (ref 0.3–1.2)
Total Protein: 8.6 g/dL — ABNORMAL HIGH (ref 6.5–8.1)

## 2021-08-31 LAB — CBC WITH DIFFERENTIAL/PLATELET
Abs Immature Granulocytes: 0.05 10*3/uL (ref 0.00–0.07)
Basophils Absolute: 0 10*3/uL (ref 0.0–0.1)
Basophils Relative: 0 %
Eosinophils Absolute: 0.1 10*3/uL (ref 0.0–0.5)
Eosinophils Relative: 1 %
HCT: 39.9 % (ref 36.0–46.0)
Hemoglobin: 13 g/dL (ref 12.0–15.0)
Immature Granulocytes: 1 %
Lymphocytes Relative: 15 %
Lymphs Abs: 1.3 10*3/uL (ref 0.7–4.0)
MCH: 28.2 pg (ref 26.0–34.0)
MCHC: 32.6 g/dL (ref 30.0–36.0)
MCV: 86.6 fL (ref 80.0–100.0)
Monocytes Absolute: 0.4 10*3/uL (ref 0.1–1.0)
Monocytes Relative: 5 %
Neutro Abs: 6.6 10*3/uL (ref 1.7–7.7)
Neutrophils Relative %: 78 %
Platelets: 202 10*3/uL (ref 150–400)
RBC: 4.61 MIL/uL (ref 3.87–5.11)
RDW: 15.4 % (ref 11.5–15.5)
WBC: 8.5 10*3/uL (ref 4.0–10.5)
nRBC: 0 % (ref 0.0–0.2)

## 2021-08-31 LAB — TROPONIN I (HIGH SENSITIVITY): Troponin I (High Sensitivity): 5 ng/L (ref <18)

## 2021-08-31 LAB — LIPASE, BLOOD: Lipase: 45 U/L (ref 11–51)

## 2021-08-31 LAB — URINALYSIS, ROUTINE W REFLEX MICROSCOPIC
Bilirubin Urine: NEGATIVE
Glucose, UA: NEGATIVE mg/dL
Ketones, ur: 5 mg/dL — AB
Leukocytes,Ua: NEGATIVE
Nitrite: NEGATIVE
Protein, ur: 100 mg/dL — AB
Specific Gravity, Urine: 1.01 (ref 1.005–1.030)
pH: 6 (ref 5.0–8.0)

## 2021-08-31 LAB — GLUCOSE, CAPILLARY: Glucose-Capillary: 204 mg/dL — ABNORMAL HIGH (ref 70–99)

## 2021-08-31 MED ORDER — FAMOTIDINE IN NACL 20-0.9 MG/50ML-% IV SOLN
20.0000 mg | Freq: Once | INTRAVENOUS | Status: AC
  Administered 2021-08-31: 20 mg via INTRAVENOUS
  Filled 2021-08-31: qty 50

## 2021-08-31 MED ORDER — CARVEDILOL 12.5 MG PO TABS
12.5000 mg | ORAL_TABLET | Freq: Once | ORAL | Status: AC
Start: 2021-08-31 — End: 2021-08-31
  Administered 2021-08-31: 12.5 mg via ORAL
  Filled 2021-08-31: qty 1

## 2021-08-31 MED ORDER — SODIUM CHLORIDE 0.9 % IV SOLN: 8.0000 mg | Freq: Four times a day (QID) | INTRAVENOUS | Status: DC | PRN

## 2021-08-31 MED ORDER — ACETAMINOPHEN 650 MG RE SUPP
650.0000 mg | Freq: Four times a day (QID) | RECTAL | Status: DC | PRN
  Filled 2021-08-31: qty 1

## 2021-08-31 MED ORDER — PANTOPRAZOLE SODIUM 40 MG IV SOLR: 40.0000 mg | Freq: Two times a day (BID) | INTRAVENOUS | Status: DC

## 2021-08-31 MED ORDER — METOCLOPRAMIDE HCL 5 MG/ML IJ SOLN: 10.0000 mg | Freq: Four times a day (QID) | INTRAMUSCULAR | Status: DC | PRN

## 2021-08-31 MED ORDER — HYDROMORPHONE HCL 1 MG/ML IJ SOLN
1.0000 mg | Freq: Once | INTRAMUSCULAR | Status: AC
  Administered 2021-08-31: 1 mg via INTRAVENOUS
  Filled 2021-08-31: qty 1

## 2021-08-31 MED ORDER — SUCRALFATE 1 GM/10ML PO SUSP
1.0000 g | Freq: Once | ORAL | Status: AC
  Administered 2021-08-31: 1 g via ORAL
  Filled 2021-08-31: qty 10

## 2021-08-31 MED ORDER — CARVEDILOL 12.5 MG PO TABS
12.5000 mg | ORAL_TABLET | Freq: Two times a day (BID) | ORAL | Status: DC
  Administered 2021-08-31 – 2021-09-09 (×17): 12.5 mg via ORAL
  Filled 2021-08-31 (×18): qty 1

## 2021-08-31 MED ORDER — PANTOPRAZOLE SODIUM 40 MG IV SOLR
40.0000 mg | Freq: Once | INTRAVENOUS | Status: AC
  Administered 2021-08-31: 40 mg via INTRAVENOUS
  Filled 2021-08-31: qty 10

## 2021-08-31 MED ORDER — AMLODIPINE BESYLATE 5 MG PO TABS
10.0000 mg | ORAL_TABLET | Freq: Once | ORAL | Status: AC
  Administered 2021-08-31: 10 mg via ORAL
  Filled 2021-08-31: qty 2

## 2021-08-31 MED ORDER — ONDANSETRON HCL 4 MG/2ML IJ SOLN
4.0000 mg | Freq: Once | INTRAMUSCULAR | Status: AC
  Administered 2021-08-31: 4 mg via INTRAVENOUS
  Filled 2021-08-31: qty 2

## 2021-08-31 MED ORDER — APIXABAN 5 MG PO TABS
5.0000 mg | ORAL_TABLET | Freq: Two times a day (BID) | ORAL | Status: DC
  Administered 2021-08-31 – 2021-09-09 (×18): 5 mg via ORAL
  Filled 2021-08-31 (×18): qty 1

## 2021-08-31 MED ORDER — LEVOTHYROXINE SODIUM 100 MCG PO TABS
100.0000 ug | ORAL_TABLET | Freq: Every day | ORAL | Status: DC
  Administered 2021-09-01 – 2021-09-09 (×9): 100 ug via ORAL
  Filled 2021-08-31 (×9): qty 1

## 2021-08-31 MED ORDER — AMLODIPINE BESYLATE 10 MG PO TABS
10.0000 mg | ORAL_TABLET | Freq: Every day | ORAL | Status: DC
  Administered 2021-08-31 – 2021-09-09 (×10): 10 mg via ORAL
  Filled 2021-08-31: qty 1
  Filled 2021-08-31 (×5): qty 2
  Filled 2021-08-31: qty 1
  Filled 2021-08-31 (×3): qty 2

## 2021-08-31 MED ORDER — ONDANSETRON HCL 4 MG/2ML IJ SOLN
8.0000 mg | Freq: Once | INTRAMUSCULAR | Status: AC
  Administered 2021-08-31: 8 mg via INTRAVENOUS
  Filled 2021-08-31: qty 4

## 2021-08-31 MED ORDER — ONDANSETRON HCL 4 MG/2ML IJ SOLN
4.0000 mg | Freq: Once | INTRAMUSCULAR | Status: AC
Start: 2021-08-31 — End: 2021-08-31
  Administered 2021-08-31: 4 mg via INTRAVENOUS
  Filled 2021-08-31: qty 2

## 2021-08-31 MED ORDER — PANTOPRAZOLE INFUSION (NEW) - SIMPLE MED
8.0000 mg/h | INTRAVENOUS | Status: DC
  Administered 2021-08-31: 8 mg/h via INTRAVENOUS
  Filled 2021-08-31: qty 80
  Filled 2021-08-31 (×4): qty 100

## 2021-08-31 MED ORDER — BENAZEPRIL HCL 20 MG PO TABS
40.0000 mg | ORAL_TABLET | Freq: Once | ORAL | Status: AC
  Administered 2021-08-31: 40 mg via ORAL
  Filled 2021-08-31: qty 2

## 2021-08-31 MED ORDER — IOHEXOL 300 MG/ML  SOLN
100.0000 mL | Freq: Once | INTRAMUSCULAR | Status: AC | PRN
  Administered 2021-08-31: 100 mL via INTRAVENOUS

## 2021-08-31 MED ORDER — INSULIN ASPART 100 UNIT/ML IJ SOLN
0.0000 [IU] | Freq: Three times a day (TID) | INTRAMUSCULAR | Status: DC
  Administered 2021-09-01: 3 [IU] via SUBCUTANEOUS
  Administered 2021-09-01: 11 [IU] via SUBCUTANEOUS
  Administered 2021-09-01 – 2021-09-02 (×3): 4 [IU] via SUBCUTANEOUS
  Administered 2021-09-02 – 2021-09-03 (×2): 7 [IU] via SUBCUTANEOUS
  Administered 2021-09-03 – 2021-09-05 (×5): 4 [IU] via SUBCUTANEOUS
  Administered 2021-09-05: 7 [IU] via SUBCUTANEOUS
  Administered 2021-09-06: 4 [IU] via SUBCUTANEOUS
  Administered 2021-09-06: 3 [IU] via SUBCUTANEOUS
  Administered 2021-09-06 – 2021-09-07 (×3): 4 [IU] via SUBCUTANEOUS
  Administered 2021-09-07: 3 [IU] via SUBCUTANEOUS
  Administered 2021-09-08: 4 [IU] via SUBCUTANEOUS
  Administered 2021-09-08: 3 [IU] via SUBCUTANEOUS
  Administered 2021-09-09 (×3): 4 [IU] via SUBCUTANEOUS

## 2021-08-31 MED ORDER — FLUTICASONE PROPIONATE HFA 44 MCG/ACT IN AERO
2.0000 | INHALATION_SPRAY | Freq: Two times a day (BID) | RESPIRATORY_TRACT | Status: DC
Start: 2021-08-31 — End: 2021-08-31

## 2021-08-31 MED ORDER — MORPHINE SULFATE (PF) 4 MG/ML IV SOLN
4.0000 mg | Freq: Once | INTRAVENOUS | Status: AC
  Administered 2021-08-31: 4 mg via INTRAVENOUS
  Filled 2021-08-31: qty 1

## 2021-08-31 MED ORDER — POLYETHYLENE GLYCOL 3350 17 G PO PACK
17.0000 g | PACK | Freq: Every day | ORAL | Status: DC | PRN
  Filled 2021-08-31: qty 1

## 2021-08-31 MED ORDER — METOCLOPRAMIDE HCL 5 MG/ML IJ SOLN
10.0000 mg | Freq: Once | INTRAMUSCULAR | Status: AC
Start: 2021-08-31 — End: 2021-08-31
  Administered 2021-08-31: 10 mg via INTRAVENOUS
  Filled 2021-08-31: qty 2

## 2021-08-31 MED ORDER — APIXABAN 5 MG PO TABS
5.0000 mg | ORAL_TABLET | Freq: Once | ORAL | Status: AC
  Administered 2021-08-31: 5 mg via ORAL
  Filled 2021-08-31: qty 1

## 2021-08-31 MED ORDER — ALBUTEROL SULFATE (2.5 MG/3ML) 0.083% IN NEBU
2.5000 mg | INHALATION_SOLUTION | Freq: Four times a day (QID) | RESPIRATORY_TRACT | Status: DC | PRN
  Administered 2021-09-01: 2.5 mg via RESPIRATORY_TRACT
  Filled 2021-08-31: qty 3

## 2021-08-31 MED ORDER — BENAZEPRIL HCL 20 MG PO TABS
40.0000 mg | ORAL_TABLET | Freq: Every day | ORAL | Status: DC
  Administered 2021-08-31 – 2021-09-01 (×2): 40 mg via ORAL
  Filled 2021-08-31 (×2): qty 2

## 2021-08-31 MED ORDER — PREGABALIN 50 MG PO CAPS
100.0000 mg | ORAL_CAPSULE | Freq: Three times a day (TID) | ORAL | Status: DC
  Administered 2021-08-31 – 2021-09-09 (×27): 100 mg via ORAL
  Filled 2021-08-31 (×27): qty 2

## 2021-08-31 MED ORDER — SIMETHICONE 80 MG PO CHEW
80.0000 mg | CHEWABLE_TABLET | Freq: Once | ORAL | Status: AC
  Administered 2021-08-31: 80 mg via ORAL
  Filled 2021-08-31: qty 1

## 2021-08-31 MED ORDER — PRAVASTATIN SODIUM 20 MG PO TABS
40.0000 mg | ORAL_TABLET | Freq: Every day | ORAL | Status: DC
  Administered 2021-08-31 – 2021-09-07 (×8): 40 mg via ORAL
  Filled 2021-08-31: qty 2
  Filled 2021-08-31 (×7): qty 1

## 2021-08-31 MED ORDER — POTASSIUM CHLORIDE IN NACL 20-0.9 MEQ/L-% IV SOLN
INTRAVENOUS | Status: DC
  Filled 2021-08-31: qty 1000

## 2021-08-31 MED ORDER — TRAZODONE HCL 50 MG PO TABS
50.0000 mg | ORAL_TABLET | Freq: Every evening | ORAL | Status: DC | PRN
  Administered 2021-09-01 – 2021-09-06 (×2): 50 mg via ORAL
  Filled 2021-08-31 (×3): qty 1

## 2021-08-31 MED ORDER — ONDANSETRON HCL 4 MG PO TABS
8.0000 mg | ORAL_TABLET | Freq: Four times a day (QID) | ORAL | Status: DC | PRN
Start: 2021-08-31 — End: 2021-09-01

## 2021-08-31 MED ORDER — PANTOPRAZOLE 80MG IVPB - SIMPLE MED
80.0000 mg | Freq: Once | INTRAVENOUS | Status: AC
  Administered 2021-08-31: 80 mg via INTRAVENOUS
  Filled 2021-08-31: qty 100

## 2021-08-31 MED ORDER — INSULIN ASPART PROT & ASPART (70-30 MIX) 100 UNIT/ML ~~LOC~~ SUSP
12.0000 [IU] | Freq: Two times a day (BID) | SUBCUTANEOUS | Status: DC
  Administered 2021-09-01: 12 [IU] via SUBCUTANEOUS
  Filled 2021-08-31: qty 10

## 2021-08-31 MED ORDER — MORPHINE SULFATE (PF) 2 MG/ML IV SOLN: 2.0000 mg | INTRAVENOUS | Status: DC | PRN

## 2021-08-31 MED ORDER — BUDESONIDE 0.25 MG/2ML IN SUSP
0.2500 mg | Freq: Two times a day (BID) | RESPIRATORY_TRACT | Status: DC
  Administered 2021-08-31 – 2021-09-09 (×13): 0.25 mg via RESPIRATORY_TRACT
  Filled 2021-08-31 (×18): qty 2

## 2021-08-31 MED ORDER — ACETAMINOPHEN 325 MG PO TABS
650.0000 mg | ORAL_TABLET | Freq: Four times a day (QID) | ORAL | Status: DC | PRN
  Administered 2021-08-31 – 2021-09-09 (×10): 650 mg via ORAL
  Filled 2021-08-31 (×12): qty 2

## 2021-08-31 MED ORDER — MORPHINE SULFATE (PF) 4 MG/ML IV SOLN: 4.0000 mg | INTRAVENOUS | Status: DC | PRN

## 2021-08-31 NOTE — Consult Note (Signed)
[name], M.D. Gastroenterology & Hepatology                                           Patient Name: [name] Account #: @FLAACCTNO @   MRN: [id number] Admission Date: 08/31/2021 Date of Evaluation:  08/31/2021 Time of Evaluation: 10:40 PM  Referring Physician: Evalee Jefferson, PA-C  Chief Complaint: Abdominal pain and nausea with vomiting  HPI:  This is a 66 y.o. female with history of asthma, right breast cancer, GERD, Graves' disease, hyperlipidemia, hypertension, insulin-dependent diabetes, osteoarthritis, sleep apnea, obesity, who comes to the hospital after presenting acute onset of abdominal pain and nausea with vomiting.  Patient reports that today in the morning at 4 AM she woke up with acute pain in her epigastric area which did not radiate anywhere else but was very severe and constant.  She reports current similar episodes with milder severity in the past with most recent episode possibly 5 months ago.  However this time was very severe and was accompanied by recurrent nausea and multiple episodes of bilious vomiting.  She denies any melena, hematochezia, abdominal distention, fever, chills, hematemesis or changes in her weight.  She has not had previous EGD but had a colonoscopy more than 10 years ago, no reports are available.  In the ED, he was HD stable and afebrile initially, although during her stay in the ER she presented a fever of 101.4 andWas hypertensive. Labs were remarkable for normal lipase of 45, normal liver function test, renal function and electrolytes, CBC was completely normal. UA showed bacteriuria. Had negative troponin x1. CT abdomen and pelvis with IV contrast showed cholelithiasis without signs of cholecystitis or any other intra-abdominal abnormalities.  Past Medical History: SEE CHRONIC ISSSUES: Past Medical History:  Diagnosis Date   Asthma    prn neb. and inhaler   Breast cancer ([place]) 01/2013   right   Dehydration 04/13/2013   GERD  (gastroesophageal reflux disease)    Graves' disease    Hyperlipidemia    Hypertension    on multiple meds., has been on med. > 14 yr.   Non-insulin dependent type 2 diabetes mellitus ([place])    Obesity    Ophthalmic manifestation of Graves disease    Osteoarthritis 2003   left knee   Radiation 10/11/13-11/29/13   Right Breast   Sleep apnea 4/16   mod   Wears glasses    Past Surgical History:  Past Surgical History:  Procedure Laterality Date   CARDIOVERSION N/A 11/08/2013   Procedure: CARDIOVERSION;  Surgeon: Lacey Perla, Jackson;  [facility] ENDOSCOPY;  Service: Cardiovascular;  Laterality: N/A;   COLONOSCOPY  2007   COLONOSCOPY W/ POLYPECTOMY  2009   EXCISION OF KELOID N/A 05/18/2014   Procedure: EXCISION OF KELOID;  Surgeon: Lacey Luna, Jackson;  Location: [place];  Service: General;  Laterality: N/A;   PORT-A-CATH REMOVAL Right 05/18/2014   Procedure: REMOVAL PORT-A-CATH;  Surgeon: Lacey Luna, Jackson;  Location: [place];  Service: General;  Laterality: Right;   PORTACATH PLACEMENT Right 02/17/2013   Procedure: INSERTION PORT-A-CATH;  Surgeon: Lacey Faster. Cornett, Jackson;  Location: [place];  Service: General;  Laterality: Right;   TEE WITHOUT CARDIOVERSION N/A 11/08/2013   Procedure: TRANSESOPHAGEAL ECHOCARDIOGRAM (TEE);  Surgeon: Lacey Perla, Jackson;  Location: [facility] ENDOSCOPY;  Service: Cardiovascular;  Laterality: N/A;   TOTAL KNEE  ARTHROPLASTY Left 2003   TOTAL THYROIDECTOMY     TUBAL LIGATION  1985   Family History:  Family History  Problem Relation Age of Onset   Diabetes Mother    Heart disease Father    Lung cancer Father    Diabetes Sister    Hypertension Sister    Asthma Sister    Rheum arthritis Daughter    Hypertension Daughter    Fibromyalgia Daughter    Hypertension Daughter    Social History:  Social History   Tobacco Use   Smoking status: Former    Packs/day: 0.25    Years: 20.00    Total pack years:  5.00    Types: Cigarettes    Quit date: 02/10/1991    Years since quitting: 30.5    Passive exposure: Never   Smokeless tobacco: Never  Vaping Use   Vaping Use: Never used  Substance Use Topics   Alcohol use: No   Drug use: No    Home Medications:  Prior to Admission medications   Medication Sig Start Date End Date Taking? Authorizing Provider  albuterol (PROVENTIL) (2.5 MG/3ML) 0.083% nebulizer solution USE 1 VIAL IN NEBULIZER EVERY 6 HOURS AS NEEDED FOR WHEEZING OR SHORTNESS OF BREATH. Patient taking differently: Take 2.5 mg by nebulization every 6 (six) hours as needed for wheezing or shortness of breath. 02/06/21  Yes [name]  albuterol (VENTOLIN HFA) 108 (90 Base) MCG/ACT inhaler INHALE 2 PUFFS INTO THE LUNGS EVERY 6 HOURS AS NEEDED FOR SHORTNESS OF BREATH OR WHEEZING. Patient taking differently: Inhale 2 puffs into the lungs every 6 (six) hours as needed for wheezing or shortness of breath. 01/01/21  Yes [name]  amLODipine (NORVASC) 10 MG tablet TAKE (1) TABLET BY MOUTH ONCE DAILY. Patient taking differently: Take 10 mg by mouth daily. 04/22/21  Yes [name]  benazepril (LOTENSIN) 40 MG tablet TAKE (1) TABLET BY MOUTH ONCE DAILY. Patient taking differently: Take 40 mg by mouth daily. 07/08/21  Yes [name]  carvedilol (COREG) 12.5 MG tablet TAKE (1) TABLET BY MOUTH TWICE DAILY. Patient taking differently: Take 12.5 mg by mouth 2 (two) times daily with a meal. 08/12/21  Yes [name]  Dulaglutide (TRULICITY) 5.36 RW/4.3XV SOPN Inject 0.75 mg into the skin once a week. 02/24/20  Yes [name]  ELIQUIS 5 MG TABS tablet TAKE (1) TABLET BY MOUTH TWICE DAILY. Patient taking differently: Take 5 mg by mouth 2 (two) times daily. 06/11/21  Yes [name]  fluticasone (FLOVENT HFA) 44 MCG/ACT inhaler Inhale 2 puffs into the lungs 2 (two) times daily. 06/08/18  Yes [name]  HUMALOG MIX 75/25 KWIKPEN (75-25) 100 UNIT/ML  Kwikpen Inject 12 Units into the skin in the morning and at bedtime. 09/24/15  Yes [name]  levothyroxine (SYNTHROID) 100 MCG tablet Take 100 mcg by mouth daily before breakfast.   Yes [name]  pravastatin (PRAVACHOL) 40 MG tablet TAKE (1) TABLET BY MOUTH AT BEDTIME. Patient taking differently: Take 40 mg by mouth at bedtime. 07/29/21  Yes [name]  pregabalin (LYRICA) 100 MG capsule Take 100 mg by mouth 3 (three) times daily. 04/20/21  Yes [name]  traMADol (ULTRAM) 50 MG tablet TAKE 1 TABLET EVERY 8 HOURS AS NEEDED FOR MODERATE TO SEVERE PAIN. Patient taking differently: Take 50 mg by mouth every 8 (eight) hours as needed for moderate pain or severe  pain. 08/22/21  Yes Coral Spikes, Jackson  Incontinence Supply Disposable (DEPEND UNDERWEAR X-LARGE) MISC Use daily as needed. 10/31/20   Coral Spikes, Jackson  Insulin Pen Needle 31G X 8 MM MISC Use to inject insulin one time daily 02/21/13   [name]  [facility] ULTRA test strip USE TO TEST BLOOD SUGAR 4 TIMES DAILY AS DIRECTED. 06/18/20   Kathyrn Drown, Jackson    Inpatient Medications:  Current Facility-Administered Medications:    0.9 % NaCl with KCl 20 mEq/ L  infusion, , Intravenous, Continuous, Lady Deutscher, Jackson, Last Rate: 75 mL/hr at 08/31/21 2122, New Bag at 08/31/21 2122   acetaminophen (TYLENOL) tablet 650 mg, 650 mg, Oral, Q6H PRN **OR** acetaminophen (TYLENOL) suppository 650 mg, 650 mg, Rectal, Q6H PRN, Lady Deutscher, Jackson   albuterol (PROVENTIL) (2.5 MG/3ML) 0.083% nebulizer solution 2.5 mg, 2.5 mg, Nebulization, Q6H PRN, Lady Deutscher, Jackson   amLODipine (NORVASC) tablet 10 mg, 10 mg, Oral, Daily, Lady Deutscher, Jackson, 10 mg at 08/31/21 2113   apixaban (ELIQUIS) tablet 5 mg, 5 mg, Oral, BID, Evangeline Gula, Wyatt Haste, Jackson   benazepril (LOTENSIN) tablet 40 mg, 40 mg, Oral, Daily, Lady Deutscher, Jackson, 40 mg at 08/31/21 2114   budesonide (PULMICORT) nebulizer solution 0.25  mg, 0.25 mg, Nebulization, BID, Lady Deutscher, Jackson, 0.25 mg at 08/31/21 2119   carvedilol (COREG) tablet 12.5 mg, 12.5 mg, Oral, BID WC, Lady Deutscher, Jackson, 12.5 mg at 08/31/21 2114   [START ON 09/01/2021] insulin aspart (novoLOG) injection 0-20 Units, 0-20 Units, Subcutaneous, TID WC, Evangeline Gula, Wyatt Haste, Jackson   [START ON 09/01/2021] insulin aspart protamine- aspart (NOVOLOG MIX 70/30) injection 12 Units, 12 Units, Subcutaneous, BID WC, Lady Deutscher, Jackson   [START ON 09/01/2021] levothyroxine (SYNTHROID) tablet 100 mcg, 100 mcg, Oral, QAC breakfast, Evangeline Gula, Wyatt Haste, Jackson   metoCLOPramide (REGLAN) injection 10 mg, 10 mg, Intravenous, Q6H PRN, Lady Deutscher, Jackson   morphine (PF) 2 MG/ML injection 2 mg, 2 mg, Intravenous, Q2H PRN, Lady Deutscher, Jackson   morphine (PF) 4 MG/ML injection 4 mg, 4 mg, Intravenous, Q2H PRN, Evangeline Gula, Wyatt Haste, Jackson   ondansetron (ZOFRAN) tablet 8 mg, 8 mg, Oral, Q6H PRN **OR** ondansetron (ZOFRAN) 8 mg in sodium chloride 0.9 % 50 mL IVPB, 8 mg, Intravenous, Q6H PRN, Lady Deutscher, Jackson   pantoprazole (PROTONIX) 80 mg /NS 100 mL IVPB, 80 mg, Intravenous, Once, Lady Deutscher, Jackson   [START ON 09/04/2021] pantoprazole (PROTONIX) injection 40 mg, 40 mg, Intravenous, Q12H, Evangeline Gula, Wyatt Haste, Jackson   pantoprozole (PROTONIX) 80 mg /NS 100 mL infusion, 8 mg/hr, Intravenous, Continuous, Evangeline Gula, Wyatt Haste, Jackson   polyethylene glycol (MIRALAX / GLYCOLAX) packet 17 g, 17 g, Oral, Daily PRN, Lady Deutscher, Jackson   pravastatin (PRAVACHOL) tablet 40 mg, 40 mg, Oral, QHS, Evangeline Gula, Wyatt Haste, Jackson   pregabalin (LYRICA) capsule 100 mg, 100 mg, Oral, Q8H, Evangeline Gula, Wyatt Haste, Jackson   traZODone (DESYREL) tablet 50 mg, 50 mg, Oral, QHS PRN, Lady Deutscher, Jackson Allergies: Procardia [nifedipine], Aspirin, and Diltiazem  Complete Review of Systems: GENERAL: negative for malaise, night sweats HEENT: No changes in hearing or vision, no nose bleeds or other nasal problems. NECK:  Negative for lumps, goiter, pain and significant neck swelling RESPIRATORY: Negative for cough, wheezing CARDIOVASCULAR: Negative for chest pain, leg swelling, palpitations, orthopnea GI: SEE HPI MUSCULOSKELETAL: Negative for joint pain or swelling, back pain, and muscle pain. SKIN: Negative for lesions,  rash PSYCH: Negative for sleep disturbance, mood disorder and recent psychosocial stressors. HEMATOLOGY Negative for prolonged bleeding, bruising easily, and swollen nodes. ENDOCRINE: Negative for cold or heat intolerance, polyuria, polydipsia and goiter. NEURO: negative for tremor, gait imbalance, syncope and seizures. The remainder of the review of systems is noncontributory.  Physical Exam: BP (!) 176/83 (BP Location: Left Wrist)   Pulse 94   Temp 100 F (37.8 C)   Resp 20   Ht 5' 4.5" (1.638 m)   Wt (!) 147 kg   SpO2 95%   BMI 54.76 kg/m  GENERAL: The patient is AO x3, in no acute distress. Obese. HEENT: Head is normocephalic and atraumatic. EOMI are intact. Mouth is well hydrated and without lesions. NECK: Supple. No masses LUNGS: Clear to auscultation. No presence of rhonchi/wheezing/rales. Adequate chest expansion HEART: RRR, normal s1 and s2. ABDOMEN: tender to palpation in the epigastric area, no guarding, no peritoneal signs, and nondistended. BS +. No masses. EXTREMITIES: Without any cyanosis, clubbing, rash, lesions or edema. NEUROLOGIC: AOx3, no focal motor deficit. SKIN: no jaundice, no rashes  Laboratory Data CBC:     Component Value Date/Time   WBC 8.5 08/31/2021 0944   RBC 4.61 08/31/2021 0944   HGB 13.0 08/31/2021 0944   HGB 12.8 05/30/2021 1116   HGB 12.2 03/06/2016 1252   HCT 39.9 08/31/2021 0944   HCT 38.9 05/30/2021 1116   HCT 36.5 03/06/2016 1252   PLT 202 08/31/2021 0944   PLT 252 05/30/2021 1116   MCV 86.6 08/31/2021 0944   MCV 86 05/30/2021 1116   MCV 84.1 03/06/2016 1252   MCH 28.2 08/31/2021 0944   MCHC 32.6 08/31/2021 0944   RDW 15.4  08/31/2021 0944   RDW 15.2 05/30/2021 1116   RDW 15.7 (H) 03/06/2016 1252   LYMPHSABS 1.3 08/31/2021 0944   LYMPHSABS 1.4 05/15/2020 1052   LYMPHSABS 1.7 03/06/2016 1252   MONOABS 0.4 08/31/2021 0944   MONOABS 0.5 03/06/2016 1252   EOSABS 0.1 08/31/2021 0944   EOSABS 0.1 05/15/2020 1052   BASOSABS 0.0 08/31/2021 0944   BASOSABS 0.0 05/15/2020 1052   BASOSABS 0.0 03/06/2016 1252   COAG:  Lab Results  Component Value Date   INR 1.29 09/07/2013   INR 1.1 03/08/2008    BMP:     Latest Ref Rng & Units 08/31/2021    9:44 AM 05/30/2021   11:16 AM 05/15/2020   10:52 AM  BMP  Glucose 70 - 99 mg/dL 213  160  160   BUN 8 - 23 mg/dL 14  16  12    Creatinine 0.44 - 1.00 mg/dL 0.97  1.13  0.98   BUN/Creat Ratio 12 - 28  14  12    Sodium 135 - 145 mmol/L 140  143  147   Potassium 3.5 - 5.1 mmol/L 3.8  4.2  4.1   Chloride 98 - 111 mmol/L 103  103  104   CO2 22 - 32 mmol/L 25  23  22    Calcium 8.9 - 10.3 mg/dL 9.3  9.3  9.2     HEPATIC:     Latest Ref Rng & Units 08/31/2021    9:44 AM 05/30/2021   11:16 AM 05/15/2020   10:52 AM  Hepatic Function  Total Protein 6.5 - 8.1 g/dL 8.6  7.7  7.7   Albumin 3.5 - 5.0 g/dL 4.3  4.5  4.5   AST 15 - 41 U/L 30  24  20    ALT 0 - 44 U/L  21  24  18    Alk Phosphatase 38 - 126 U/L 84  88  77   Total Bilirubin 0.3 - 1.2 mg/dL 0.8  0.3  0.4     CARDIAC:  Lab Results  Component Value Date   TROPONINI <0.30 11/04/2013     Imaging: I personally reviewed and interpreted the available imaging.  Assessment & Plan: [name] is a  66 y.o. female with history of asthma, right breast cancer, GERD, Graves' disease, hyperlipidemia, hypertension, insulin-dependent diabetes, osteoarthritis, sleep apnea, obesity, who comes to the hospital after presenting acute onset of abdominal pain and nausea with vomiting.  The patient had acute onset of symptoms of nausea abdominal pain in the epigastric area which did not respond to initial management with famotidine  and antiemetics.  Yesterday she was hospitalized by the ER staff.  She is currently feeling somewhat better.  It is unclear why she has presented her symptoms, as her blood work-up has been unremarkable and her urinalysis only showed presence of bacteriuria, with presence of cholelithiasis in her CT scan.  She has now also presented with fever.  I Jackson consider important to evaluate further if there are changes consistent with cholecystitis with a right upper quadrant ultrasound and the urine culture should be followed to evaluate the urinary source for her complaints.  From the gastrointestinal perspective, she would benefit from starting pantoprazole 40 mg twice a day and slowly advancing her diet.  If her abdominal pain persists despite this, can consider performing an upper endoscopy.  She can try combination of Zofran and Reglan as needed to improve her nausea.  - RUQ [place] - Follow urine culture -Pantoprazole 40 mg BID IV - May consider EGD if pain not improving - Zofran as needed for nausea - Regaln 5 mg every  8 h as needed if refractory to Zofran - Advance diet as needed  Harvel Quale, Jackson Gastroenterology and Hepatology [facility]

## 2021-08-31 NOTE — H&P (Signed)
History and Physical    [name]:[id number] DOB: Aug 06, 1955 DOA: 08/31/2021  PCP: Coral Spikes, DO  Patient coming from: Home  I have personally briefly reviewed patient's old medical records in [place]  Chief Complaint: Uncontrolled abdominal pain which began at 4 AM on 08/31/2021  HPI: [name] is a 66 y.o. female with medical history significant of hypertension, diabetes, hyperlipidemia, asthma, chronic kidney disease, atrial fibrillation, GERD who presented to the emergency room today after awakening at 4 AM with severe abdominal pain.  Initially she thought it was indigestion because she had frequent burping but she had onset of epigastric abdominal pain and pain that was directly under her breast.  Pain is sharp and constant without radiation into her back or chest.  Round 8 AM she developed nausea and multiple episodes of nonbloody emesis and continues to have emesis while in the emergency department during my examination.  She also complains of abdominal pain as described above.  Has had no diarrhea or constipation.  Her last bowel movement was prior to arrival which did not affect her symptoms.  She has had no dysuria no fevers or chills no chest pain or shortness of breath beyond her baseline which she attributes to her asthma.  Took a Zantac prior to arrival to the emergency department but it did not improve her symptoms.  She reports that yesterday she had a hamburger with extra raw onions and some tomatoes on it and some cheese as well.  Rectus she had sausages and hashbrowns with water and she had a couple of brownies.  Usually does not eat that amount of raw onions but has had it before not had problems with it.  In the emergency department she had multiple medications which did not seem to ameliorate her pain.  ED Course: CT scan was obtained and very reassuring however patient received 4 mg of morphine, 16 mg of ondansetron, 20 mg of famotidine, 1 g of  Carafate, 80 mg of simethicone, 10 mg of metoclopramide, 1 mg of Dilaudid, still did not have much relief.  I was contacted to admit the patient into the hospital after discussion with GI. Dr  Jenetta Downer suggested that if her pain persisted overnight he would do an EGD tomorrow morning.  Review of Systems: As per HPI otherwise all other systems reviewed and  negative.  Family history reviewed and found to be noncontributory  Past Medical History:  Diagnosis Date   Asthma    prn neb. and inhaler   Breast cancer ([place]) 01/2013   right   Dehydration 04/13/2013   GERD (gastroesophageal reflux disease)    Graves' disease    Hyperlipidemia    Hypertension    on multiple meds., has been on med. > 14 yr.   Non-insulin dependent type 2 diabetes mellitus ([place])    Obesity    Ophthalmic manifestation of Graves disease    Osteoarthritis 2003   left knee   Radiation 10/11/13-11/29/13   Right Breast   Sleep apnea 4/16   mod   Wears glasses     Past Surgical History:  Procedure Laterality Date   CARDIOVERSION N/A 11/08/2013   Procedure: CARDIOVERSION;  Surgeon: Lelon Perla, MD;  Location: [facility] ENDOSCOPY;  Service: Cardiovascular;  Laterality: N/A;   COLONOSCOPY  2007   COLONOSCOPY W/ POLYPECTOMY  2009   EXCISION OF KELOID N/A 05/18/2014   Procedure: EXCISION OF KELOID;  Surgeon: Erroll Luna, MD;  Location: [place];  Service: General;  Laterality: N/A;   PORT-A-CATH REMOVAL Right 05/18/2014   Procedure: REMOVAL PORT-A-CATH;  Surgeon: Erroll Luna, MD;  Location: [place];  Service: General;  Laterality: Right;   PORTACATH PLACEMENT Right 02/17/2013   Procedure: INSERTION PORT-A-CATH;  Surgeon: Joyice Faster. Cornett, MD;  Location: [place];  Service: General;  Laterality: Right;   TEE WITHOUT CARDIOVERSION N/A 11/08/2013   Procedure: TRANSESOPHAGEAL ECHOCARDIOGRAM (TEE);  Surgeon: Lelon Perla, MD;  Location: [facility] ENDOSCOPY;  Service:  Cardiovascular;  Laterality: N/A;   TOTAL KNEE ARTHROPLASTY Left 2003   TOTAL THYROIDECTOMY     TUBAL LIGATION  1985    Social History   Social History Narrative   Lives in [place]           reports that she quit smoking about 30 years ago. Her smoking use included cigarettes. She has a 5.00 pack-year smoking history. She has never been exposed to tobacco smoke. She has never used smokeless tobacco. She reports that she does not drink alcohol and does not use drugs.  Allergies  Allergen Reactions   Procardia [Nifedipine] Other (See Comments)    MIGRAINES   Aspirin     Unable to take due to blood pressure medication   Diltiazem Itching    Family History  Problem Relation Age of Onset   Diabetes Mother    Heart disease Father    Lung cancer Father    Diabetes Sister    Hypertension Sister    Asthma Sister    Rheum arthritis Daughter    Hypertension Daughter    Fibromyalgia Daughter    Hypertension Daughter    Family history reviewed and found to be noncontributory.  Prior to Admission medications   Medication Sig Start Date End Date Taking? Authorizing Provider  albuterol (PROVENTIL) (2.5 MG/3ML) 0.083% nebulizer solution USE 1 VIAL IN NEBULIZER EVERY 6 HOURS AS NEEDED FOR WHEEZING OR SHORTNESS OF BREATH. Patient taking differently: Take 2.5 mg by nebulization every 6 (six) hours as needed for wheezing or shortness of breath. 02/06/21  Yes Cook, Jayce G, DO  albuterol (VENTOLIN HFA) 108 (90 Base) MCG/ACT inhaler INHALE 2 PUFFS INTO THE LUNGS EVERY 6 HOURS AS NEEDED FOR SHORTNESS OF BREATH OR WHEEZING. Patient taking differently: Inhale 2 puffs into the lungs every 6 (six) hours as needed for wheezing or shortness of breath. 01/01/21  Yes Cook, Jayce G, DO  amLODipine (NORVASC) 10 MG tablet TAKE (1) TABLET BY MOUTH ONCE DAILY. Patient taking differently: Take 10 mg by mouth daily. 04/22/21  Yes Cook, Jayce G, DO  benazepril (LOTENSIN) 40 MG tablet TAKE (1) TABLET BY  MOUTH ONCE DAILY. Patient taking differently: Take 40 mg by mouth daily. 07/08/21  Yes Cook, Jayce G, DO  carvedilol (COREG) 12.5 MG tablet TAKE (1) TABLET BY MOUTH TWICE DAILY. Patient taking differently: Take 12.5 mg by mouth 2 (two) times daily with a meal. 08/12/21  Yes Cook, Jayce G, DO  Dulaglutide (TRULICITY) 3.08 MV/7.8IO SOPN Inject 0.75 mg into the skin once a week. 02/24/20  Yes [provider]  ELIQUIS 5 MG TABS tablet TAKE (1) TABLET BY MOUTH TWICE DAILY. Patient taking differently: Take 5 mg by mouth 2 (two) times daily. 06/11/21  Yes Cook, Jayce G, DO  fluticasone (FLOVENT HFA) 44 MCG/ACT inhaler Inhale 2 puffs into the lungs 2 (two) times daily. 06/08/18  Yes Mikey Kirschner, MD  HUMALOG MIX 75/25 KWIKPEN (75-25) 100 UNIT/ML Kwikpen Inject 12 Units into the skin  in the morning and at bedtime. 09/24/15  Yes [provider]  levothyroxine (SYNTHROID) 100 MCG tablet Take 100 mcg by mouth daily before breakfast.   Yes [provider]  pravastatin (PRAVACHOL) 40 MG tablet TAKE (1) TABLET BY MOUTH AT BEDTIME. Patient taking differently: Take 40 mg by mouth at bedtime. 07/29/21  Yes Cook, Jayce G, DO  pregabalin (LYRICA) 100 MG capsule Take 100 mg by mouth 3 (three) times daily. 04/20/21  Yes [provider]  traMADol (ULTRAM) 50 MG tablet TAKE 1 TABLET EVERY 8 HOURS AS NEEDED FOR MODERATE TO SEVERE PAIN. Patient taking differently: Take 50 mg by mouth every 8 (eight) hours as needed for moderate pain or severe pain. 08/22/21  Yes Coral Spikes, DO  Incontinence Supply Disposable (DEPEND UNDERWEAR X-LARGE) MISC Use daily as needed. 10/31/20   Coral Spikes, DO  Insulin Pen Needle 31G X 8 MM MISC Use to inject insulin one time daily 02/21/13   [provider]  [facility] ULTRA test strip USE TO TEST BLOOD SUGAR 4 TIMES DAILY AS DIRECTED. 06/18/20   Kathyrn Drown, MD    Physical Exam:  Constitutional: NAD, calm, comfortable Vitals:   08/31/21 1800  08/31/21 1830 08/31/21 1903 08/31/21 1933  BP: (!) 207/96 (!) 212/86 (!) 212/86 (!) 179/84  Pulse: (!) 106 99 (!) 105 94  Resp: (!) 0 19 (!) 24 18  Temp:   98.9 F (37.2 C)   TempSrc:   Oral   SpO2: 96% 97% 99% 92%  Weight:      Height:       Eyes: PERRL, lids and conjunctivae normal ENMT: Mucous membranes are moist. Posterior pharynx clear of any exudate or lesions.Normal dentition.  Neck: normal, supple, no masses, no thyromegaly Respiratory: clear to auscultation bilaterally, no wheezing, no crackles. Normal respiratory effort. No accessory muscle use.  Cardiovascular: Regular rate and rhythm, no murmurs / rubs / gallops. No extremity edema. 2+ pedal pulses. No carotid bruits.  Abdomen: Moderate tenderness to palpation but no rebound or guarding, no masses palpated. No hepatosplenomegaly. Bowel sounds positive.  Musculoskeletal: no clubbing / cyanosis. No joint deformity upper and lower extremities. Good ROM, no contractures. Normal muscle tone.  Skin: no rashes, lesions, ulcers. No induration Neurologic: CN 2-12 grossly intact. Sensation intact, DTR normal. Strength 5/5 in all 4.  Psychiatric: Normal judgment and insight. Alert and oriented x 3. Normal mood.    Labs on Admission: I have personally reviewed following labs and imaging studies  CBC: Recent Labs  Lab 08/31/21 0944  WBC 8.5  NEUTROABS 6.6  HGB 13.0  HCT 39.9  MCV 86.6  PLT 947   Basic Metabolic Panel: Recent Labs  Lab 08/31/21 0944  NA 140  K 3.8  CL 103  CO2 25  GLUCOSE 213*  BUN 14  CREATININE 0.97  CALCIUM 9.3   GFR: Estimated Creatinine Clearance: 81.4 mL/min (by C-G formula based on SCr of 0.97 mg/dL). Liver Function Tests: Recent Labs  Lab 08/31/21 0944  AST 30  ALT 21  ALKPHOS 84  BILITOT 0.8  PROT 8.6*  ALBUMIN 4.3   Recent Labs  Lab 08/31/21 0944  LIPASE 45   Urine analysis:    Component Value Date/Time   COLORURINE STRAW (A) 08/31/2021 1036   APPEARANCEUR CLEAR  08/31/2021 1036   LABSPEC 1.010 08/31/2021 1036   PHURINE 6.0 08/31/2021 1036   GLUCOSEU NEGATIVE 08/31/2021 1036   HGBUR SMALL (A) 08/31/2021 1036   BILIRUBINUR NEGATIVE 08/31/2021 1036  KETONESUR 5 (A) 08/31/2021 1036   PROTEINUR 100 (A) 08/31/2021 1036   UROBILINOGEN 0.2 09/06/2013 1040   NITRITE NEGATIVE 08/31/2021 1036   LEUKOCYTESUR NEGATIVE 08/31/2021 1036    Radiological Exams on Admission: CT ABDOMEN PELVIS W CONTRAST  Result Date: 08/31/2021 CLINICAL DATA:  Epigastric pain. EXAM: CT ABDOMEN AND PELVIS WITH CONTRAST TECHNIQUE: Multidetector CT imaging of the abdomen and pelvis was performed using the standard protocol following bolus administration of intravenous contrast. RADIATION DOSE REDUCTION: This exam was performed according to the departmental dose-optimization program which includes automated exposure control, adjustment of the mA and/or kV according to patient size and/or use of iterative reconstruction technique. CONTRAST:  120m OMNIPAQUE IOHEXOL 300 MG/ML  SOLN COMPARISON:  02/25/2013 FINDINGS: Lower chest: No acute abnormality. Hepatobiliary: No focal liver abnormality is seen. Cholelithiasis without gallbladder wall thickening. No biliary ductal dilatation. Pancreas: Unremarkable. No pancreatic ductal dilatation or surrounding inflammatory changes. Spleen: Normal in size without focal abnormality. Adrenals/Urinary Tract: Adrenal glands are unremarkable. Kidneys are normal, without renal calculi, focal lesion, or hydronephrosis. Bladder is unremarkable. Stomach/Bowel: Stomach is within normal limits. No evidence of bowel wall thickening, distention, or inflammatory changes. Appendix is normal. Vascular/Lymphatic: Normal caliber abdominal aorta with mild atherosclerosis. No lymphadenopathy. Reproductive: Small calcified left uterine fibroid. Otherwise uterus and bilateral adnexa are unremarkable. Other: No abdominal wall hernia or abnormality. No abdominopelvic ascites.  Musculoskeletal: No acute osseous abnormality. No aggressive osseous lesion. Mild osteoarthritis of bilateral SI joints. Degenerative disease with disc height loss at L5-S1. Moderate-severe bilateral facet arthropathy at L4-5 and L5-S1. IMPRESSION: 1. No acute abdominal or pelvic pathology. 2. Cholelithiasis without gallbladder wall thickening or biliary ductal dilatation. 3. Aortic Atherosclerosis (ICD10-I70.0). Electronically Signed   By: HKathreen DevoidM.D.   On: 08/31/2021 12:03    EKG: Independently reviewed Sinus tachycardia Probable left atrial enlargement Low voltage, precordial leads Nonspecific T abnormalities, diffuse leads Personally reviewed by me  Assessment/Plan Principal Problem:   Abdominal pain Active Problems:   GERD (gastroesophageal reflux disease)   Diabetes mellitus, type 2 (HCC)   CKD (chronic kidney disease) stage 2, GFR 60-89 ml/min   Hypertension   Hypothyroidism   Hyperlipidemia   OSA (obstructive sleep apnea)   Malignant neoplasm of upper-outer quadrant of right breast in female, estrogen receptor positive ([place]    1.  Abdominal pain: I suspect patient may have gastritis or possibly a gastric ulcer.  I am going to start her on a Protonix drip to try to relieve some of her pain as well as continue IV pain medication with morphine as needed.  We will continue ondansetron as well as metoclopramide if ondansetron is not successful in treating her nausea.  Expect patient will have an EGD in a.m.  We will give her clear liquid breakfast only  2.  GERD: We will continue proton pump inhibitors as begun in the emergency department.  Zantac was not successful.  3.  Diabetes type 2: Continue home medications and diabetes management protocol.  4.  Chronic kidney disease stage II: Noted avoid nephrotoxic agents.  5.  Hypertension: Continue home medications to include amlodipine, benazepril, carvedilol, and monitor blood pressures.  6.  Hypothyroidism: Continue  levothyroxine.  7.  Hyperlipidemia: Continue pravastatin.  8.  Obstructive sleep apnea: Family to bring in patient's CPAP machine.  9.  Malignant neoplasm of upper outer quadrant of right breast in a female which was ER receptor positive: Last treated in 2015 currently in remission.  DVT prophylaxis: On Eliquis Code Status: Full  code Family Communication: Spoke with patient's and her 2 daughters Disposition Plan: Likely home Consults called: ED spoke with GI Admission status: Observation   Lady Deutscher MD FACP [facility] Pager [phone]  How to contact the [facility] Attending or Consulting provider [place] or covering provider during after hours [place], for this patient?  Check the care team in [facility] and look for a) attending/consulting [facility] provider listed and b) the [facility] team listed Log into [email or web address] and use [place]'s universal password to access. If you do not have the password, please contact the hospital operator. Locate the [facility] provider you are looking for under [facility] and page to a number that you can be directly reached. If you still have difficulty reaching the provider, please page the [facility] (Director on Call) for the Hospitalists listed on amion for assistance.  If 7PM-7AM, please contact night-coverage [email or web address] Password [email or web address]  08/31/2021, 8:18 PM

## 2021-08-31 NOTE — ED Triage Notes (Signed)
Pt states upper abd pain since 0400. Pt endorses nausea and vomiting. Denies diarrhea. Pt has had BM's.

## 2021-08-31 NOTE — Progress Notes (Signed)
Setup patient's home CPAP unit and helped her get it on. Patient using Medium nasal pillows. Unit plugged into red outlet and working accordingly.

## 2021-08-31 NOTE — ED Provider Notes (Signed)
[facility] EMERGENCY DEPARTMENT Provider Note   CSN: [id number] Arrival date & time: 08/31/21  0856     History  Chief Complaint  Patient presents with   Abdominal Pain    [name] is a 66 y.o. female with a history including hypertension, diabetes, hyperlipidemia, asthma and chronic kidney disease, also history of atrial fibrillation and GERD, presenting with abdominal pain, nausea and vomiting.  She woke at 4 AM with what she suspected might have been "indigestion" as her initial symptom was frequent eructation, she had a gradual onset of left upper quadrant and epigastric abdominal pain which she describes as sharp and constant without radiation into her back or chest.  Additionally around 8 AM she developed nausea and multiple episodes of nonbloody emesis.  She is currently nausea free but still endorses abdominal pain.  She has had no diarrhea or constipation, her last bowel movement was prior to arrival which did not affect her symptoms.  She denies dysuria, fevers or chills, chest pain, or shortness of breath beyond her baseline which she attributes to her asthma.  She took a Zantac prior to arrival which did not seem to improve her symptoms.  The history is provided by the patient.       Home Medications Prior to Admission medications   Medication Sig Start Date End Date Taking? Authorizing Provider  albuterol (PROVENTIL) (2.5 MG/3ML) 0.083% nebulizer solution USE 1 VIAL IN NEBULIZER EVERY 6 HOURS AS NEEDED FOR WHEEZING OR SHORTNESS OF BREATH. Patient taking differently: Take 2.5 mg by nebulization every 6 (six) hours as needed for wheezing or shortness of breath. 02/06/21  Yes Cook, Jayce G, DO  albuterol (VENTOLIN HFA) 108 (90 Base) MCG/ACT inhaler INHALE 2 PUFFS INTO THE LUNGS EVERY 6 HOURS AS NEEDED FOR SHORTNESS OF BREATH OR WHEEZING. Patient taking differently: Inhale 2 puffs into the lungs every 6 (six) hours as needed for wheezing or shortness of breath. 01/01/21   Yes Cook, Jayce G, DO  amLODipine (NORVASC) 10 MG tablet TAKE (1) TABLET BY MOUTH ONCE DAILY. Patient taking differently: Take 10 mg by mouth daily. 04/22/21  Yes Cook, Jayce G, DO  benazepril (LOTENSIN) 40 MG tablet TAKE (1) TABLET BY MOUTH ONCE DAILY. Patient taking differently: Take 40 mg by mouth daily. 07/08/21  Yes Cook, Jayce G, DO  carvedilol (COREG) 12.5 MG tablet TAKE (1) TABLET BY MOUTH TWICE DAILY. Patient taking differently: Take 12.5 mg by mouth 2 (two) times daily with a meal. 08/12/21  Yes Cook, Jayce G, DO  Dulaglutide (TRULICITY) 5.63 OV/5.6EP SOPN Inject 0.75 mg into the skin once a week. 02/24/20  Yes [provider]  ELIQUIS 5 MG TABS tablet TAKE (1) TABLET BY MOUTH TWICE DAILY. Patient taking differently: Take 5 mg by mouth 2 (two) times daily. 06/11/21  Yes Cook, Jayce G, DO  fluticasone (FLOVENT HFA) 44 MCG/ACT inhaler Inhale 2 puffs into the lungs 2 (two) times daily. 06/08/18  Yes Mikey Kirschner, MD  HUMALOG MIX 75/25 KWIKPEN (75-25) 100 UNIT/ML Kwikpen Inject 12 Units into the skin in the morning and at bedtime. 09/24/15  Yes [provider]  levothyroxine (SYNTHROID) 100 MCG tablet Take 100 mcg by mouth daily before breakfast.   Yes [provider]  pravastatin (PRAVACHOL) 40 MG tablet TAKE (1) TABLET BY MOUTH AT BEDTIME. Patient taking differently: Take 40 mg by mouth at bedtime. 07/29/21  Yes Cook, Jayce G, DO  pregabalin (LYRICA) 100 MG capsule Take 100 mg by mouth 3 (three)  times daily. 04/20/21  Yes [provider]  traMADol (ULTRAM) 50 MG tablet TAKE 1 TABLET EVERY 8 HOURS AS NEEDED FOR MODERATE TO SEVERE PAIN. Patient taking differently: Take 50 mg by mouth every 8 (eight) hours as needed for moderate pain or severe pain. 08/22/21  Yes Coral Spikes, DO  Incontinence Supply Disposable (DEPEND UNDERWEAR X-LARGE) MISC Use daily as needed. 10/31/20   Coral Spikes, DO  Insulin Pen Needle 31G X 8 MM MISC Use to inject insulin one time  daily 02/21/13   [provider]  [facility] ULTRA test strip USE TO TEST BLOOD SUGAR 4 TIMES DAILY AS DIRECTED. 06/18/20   Kathyrn Drown, MD      Allergies    Procardia [nifedipine], Aspirin, and Diltiazem    Review of Systems   Review of Systems  Constitutional:  Negative for chills and fever.  HENT:  Negative for congestion.   Eyes: Negative.   Respiratory:  Negative for chest tightness and shortness of breath.   Cardiovascular:  Negative for chest pain.  Gastrointestinal:  Positive for abdominal pain, nausea and vomiting. Negative for constipation and diarrhea.  Genitourinary: Negative.  Negative for dysuria and flank pain.  Musculoskeletal:  Negative for arthralgias, back pain, joint swelling and neck pain.  Skin: Negative.  Negative for rash and wound.  Neurological:  Negative for dizziness, weakness, light-headedness, numbness and headaches.  Psychiatric/Behavioral: Negative.    All other systems reviewed and are negative.   Physical Exam Updated Vital Signs BP (!) 212/86 (BP Location: Left Arm)   Pulse (!) 105   Temp 98.9 F (37.2 C) (Oral)   Resp (!) 24   Ht 5' 4.5" (1.638 m)   Wt (!) 145.2 kg   SpO2 99%   BMI 54.08 kg/m  Physical Exam Vitals and nursing note reviewed.  Constitutional:      Appearance: She is well-developed.  HENT:     Head: Normocephalic and atraumatic.  Eyes:     Conjunctiva/sclera: Conjunctivae normal.  Cardiovascular:     Rate and Rhythm: Normal rate and regular rhythm.     Heart sounds: Normal heart sounds.  Pulmonary:     Effort: Pulmonary effort is normal.     Breath sounds: Normal breath sounds. No wheezing.  Abdominal:     General: Abdomen is protuberant. Bowel sounds are normal.     Palpations: Abdomen is soft.     Tenderness: There is abdominal tenderness in the epigastric area and left upper quadrant. There is no guarding or rebound. Negative signs include Murphy's sign.  Musculoskeletal:        General: Normal range of  motion.     Cervical back: Normal range of motion.  Skin:    General: Skin is warm and dry.  Neurological:     Mental Status: She is alert.     ED Results / Procedures / Treatments   Labs (all labs ordered are listed, but only abnormal results are displayed) Labs Reviewed  COMPREHENSIVE METABOLIC PANEL - Abnormal; Notable for the following components:      Result Value   Glucose, Bld 213 (*)    Total Protein 8.6 (*)    All other components within normal limits  URINALYSIS, ROUTINE W REFLEX MICROSCOPIC - Abnormal; Notable for the following components:   Color, Urine STRAW (*)    Hgb urine dipstick SMALL (*)    Ketones, ur 5 (*)    Protein, ur 100 (*)    Bacteria, UA MANY (*)  All other components within normal limits  URINE CULTURE  CBC WITH DIFFERENTIAL/PLATELET  LIPASE, BLOOD  TROPONIN I (HIGH SENSITIVITY)    EKG EKG Interpretation  Date/Time:  Saturday August 31 2021 09:17:35 EDT Ventricular Rate:  101 PR Interval:  173 QRS Duration: 83 QT Interval:  367 QTC Calculation: 476 R Axis:   29 Text Interpretation: Sinus tachycardia Probable left atrial enlargement Low voltage, precordial leads Nonspecific T abnormalities, diffuse leads Abnormal ECG Confirmed by Carmin Muskrat [phone]) on 08/31/2021 2:28:13 PM  Radiology CT ABDOMEN PELVIS W CONTRAST  Result Date: 08/31/2021 CLINICAL DATA:  Epigastric pain. EXAM: CT ABDOMEN AND PELVIS WITH CONTRAST TECHNIQUE: Multidetector CT imaging of the abdomen and pelvis was performed using the standard protocol following bolus administration of intravenous contrast. RADIATION DOSE REDUCTION: This exam was performed according to the departmental dose-optimization program which includes automated exposure control, adjustment of the mA and/or kV according to patient size and/or use of iterative reconstruction technique. CONTRAST:  155m OMNIPAQUE IOHEXOL 300 MG/ML  SOLN COMPARISON:  02/25/2013 FINDINGS: Lower chest: No acute abnormality.  Hepatobiliary: No focal liver abnormality is seen. Cholelithiasis without gallbladder wall thickening. No biliary ductal dilatation. Pancreas: Unremarkable. No pancreatic ductal dilatation or surrounding inflammatory changes. Spleen: Normal in size without focal abnormality. Adrenals/Urinary Tract: Adrenal glands are unremarkable. Kidneys are normal, without renal calculi, focal lesion, or hydronephrosis. Bladder is unremarkable. Stomach/Bowel: Stomach is within normal limits. No evidence of bowel wall thickening, distention, or inflammatory changes. Appendix is normal. Vascular/Lymphatic: Normal caliber abdominal aorta with mild atherosclerosis. No lymphadenopathy. Reproductive: Small calcified left uterine fibroid. Otherwise uterus and bilateral adnexa are unremarkable. Other: No abdominal wall hernia or abnormality. No abdominopelvic ascites. Musculoskeletal: No acute osseous abnormality. No aggressive osseous lesion. Mild osteoarthritis of bilateral SI joints. Degenerative disease with disc height loss at L5-S1. Moderate-severe bilateral facet arthropathy at L4-5 and L5-S1. IMPRESSION: 1. No acute abdominal or pelvic pathology. 2. Cholelithiasis without gallbladder wall thickening or biliary ductal dilatation. 3. Aortic Atherosclerosis (ICD10-I70.0). Electronically Signed   By: HKathreen DevoidM.D.   On: 08/31/2021 12:03    Procedures Procedures    Medications Ordered in ED Medications  morphine (PF) 4 MG/ML injection 4 mg (4 mg Intravenous Given 08/31/21 0945)  ondansetron (ZOFRAN) injection 4 mg (4 mg Intravenous Given 08/31/21 0944)  HYDROmorphone (DILAUDID) injection 1 mg (1 mg Intravenous Given 08/31/21 1039)  iohexol (OMNIPAQUE) 300 MG/ML solution 100 mL (100 mLs Intravenous Contrast Given 08/31/21 1141)  ondansetron (ZOFRAN) injection 4 mg (4 mg Intravenous Given 08/31/21 1233)  famotidine (PEPCID) IVPB 20 mg premix (0 mg Intravenous Stopped 08/31/21 1300)  sucralfate (CARAFATE) 1 GM/10ML suspension  1 g (1 g Oral Given 08/31/21 1232)  simethicone (MYLICON) chewable tablet 80 mg (80 mg Oral Given 08/31/21 1618)  metoCLOPramide (REGLAN) injection 10 mg (10 mg Intravenous Given 08/31/21 1607)  amLODipine (NORVASC) tablet 10 mg (10 mg Oral Given 08/31/21 1606)  benazepril (LOTENSIN) tablet 40 mg (40 mg Oral Given 08/31/21 1605)  carvedilol (COREG) tablet 12.5 mg (12.5 mg Oral Given 08/31/21 1605)  apixaban (ELIQUIS) tablet 5 mg (5 mg Oral Given 08/31/21 1606)  HYDROmorphone (DILAUDID) injection 1 mg (1 mg Intravenous Given 08/31/21 1909)  pantoprazole (PROTONIX) injection 40 mg (40 mg Intravenous Given 08/31/21 1921)    ED Course/ Medical Decision Making/ A&P                           Medical Decision Making Patient with persistent epigastric  and left upper quadrant pain, symptoms initially waking her at 4 AM this morning.  Differential diagnosis including gastritis, PUD, acute pancreatitis, SBO, less likely acute cholecystitis as she has a negative Murphy sign, atypical ACS.  There is no radiation into her back, doubt vascular source/AAA.  Patient was given multiple doses of pain and nausea medication.  Initially given morphine, her symptoms returned in less than an hour, she was then given Dilaudid which improved her symptoms for longer, however she continued to have significant return of her abdominal pain.  She was given Zofran x2 for nausea which was not effective, after she received Reglan, additionally was given IV Pepcid and sucralfate, her nausea resolved but the pain persisted.  She had not had any of her home medications prior to arrival, her blood pressure escalated to 220/89, she was given her missed morning p.o. blood pressure medications and her Eliquis, both to treat her pressure but also as a p.o. challenge.  She was able to maintain her blood pressure medications and her blood pressure has started to improve although is still elevated at 198/82.  Delta troponins are negative, EKG is  stable.  This does not represent atypical ACS.  She does have gallstones, but normal LFTs and lipase, negative Murphy's sign, I do not believe her symptoms are secondary to acute cholecystitis or biliary colic, although she may benefit from an ultrasound to completely rule out this possibility.  Patient was also seen by Dr. Vanita Panda, advised admission for pain control, patient is agreeable.  May want to consider a right upper quadrant ultrasound in the morning as well.  Amount and/or Complexity of Data Reviewed Labs: ordered.    Details: Per above. Radiology: ordered.    Details: Per above Discussion of management or test interpretation with external provider(s): Pt discussed with Dr. Jenetta Downer who recommended PPI now,  will see in am and plan upper endoscopy if sx persist.    Call placed to the hospitalist service for admission. Discussed with Dr. Evangeline Gula who accepts pt for admission.  Risk OTC drugs. Prescription drug management. Decision regarding hospitalization.           Final Clinical Impression(s) / ED Diagnoses Final diagnoses:  Left upper quadrant abdominal pain  Primary hypertension    Rx / DC Orders ED Discharge Orders     None         Landis Martins 08/31/21 Tommye Standard, MD 09/01/21 1435

## 2021-09-01 ENCOUNTER — Observation Stay (HOSPITAL_COMMUNITY): Payer: Medicare Other

## 2021-09-01 DIAGNOSIS — Z888 Allergy status to other drugs, medicaments and biological substances status: Secondary | ICD-10-CM | POA: Diagnosis not present

## 2021-09-01 DIAGNOSIS — G4733 Obstructive sleep apnea (adult) (pediatric): Secondary | ICD-10-CM | POA: Diagnosis present

## 2021-09-01 DIAGNOSIS — I10 Essential (primary) hypertension: Secondary | ICD-10-CM

## 2021-09-01 DIAGNOSIS — K8020 Calculus of gallbladder without cholecystitis without obstruction: Secondary | ICD-10-CM | POA: Diagnosis not present

## 2021-09-01 DIAGNOSIS — E1122 Type 2 diabetes mellitus with diabetic chronic kidney disease: Secondary | ICD-10-CM | POA: Diagnosis present

## 2021-09-01 DIAGNOSIS — K760 Fatty (change of) liver, not elsewhere classified: Secondary | ICD-10-CM | POA: Diagnosis not present

## 2021-09-01 DIAGNOSIS — Z794 Long term (current) use of insulin: Secondary | ICD-10-CM | POA: Diagnosis not present

## 2021-09-01 DIAGNOSIS — E1169 Type 2 diabetes mellitus with other specified complication: Secondary | ICD-10-CM | POA: Diagnosis present

## 2021-09-01 DIAGNOSIS — J45909 Unspecified asthma, uncomplicated: Secondary | ICD-10-CM | POA: Diagnosis present

## 2021-09-01 DIAGNOSIS — M1711 Unilateral primary osteoarthritis, right knee: Secondary | ICD-10-CM | POA: Diagnosis not present

## 2021-09-01 DIAGNOSIS — Z8679 Personal history of other diseases of the circulatory system: Secondary | ICD-10-CM | POA: Diagnosis not present

## 2021-09-01 DIAGNOSIS — R509 Fever, unspecified: Secondary | ICD-10-CM | POA: Diagnosis not present

## 2021-09-01 DIAGNOSIS — E1165 Type 2 diabetes mellitus with hyperglycemia: Secondary | ICD-10-CM | POA: Diagnosis present

## 2021-09-01 DIAGNOSIS — K2900 Acute gastritis without bleeding: Secondary | ICD-10-CM | POA: Diagnosis present

## 2021-09-01 DIAGNOSIS — D649 Anemia, unspecified: Secondary | ICD-10-CM | POA: Diagnosis present

## 2021-09-01 DIAGNOSIS — Z87891 Personal history of nicotine dependence: Secondary | ICD-10-CM | POA: Diagnosis not present

## 2021-09-01 DIAGNOSIS — N179 Acute kidney failure, unspecified: Secondary | ICD-10-CM

## 2021-09-01 DIAGNOSIS — N189 Chronic kidney disease, unspecified: Secondary | ICD-10-CM

## 2021-09-01 DIAGNOSIS — R1013 Epigastric pain: Secondary | ICD-10-CM | POA: Diagnosis not present

## 2021-09-01 DIAGNOSIS — N390 Urinary tract infection, site not specified: Secondary | ICD-10-CM | POA: Diagnosis present

## 2021-09-01 DIAGNOSIS — E785 Hyperlipidemia, unspecified: Secondary | ICD-10-CM | POA: Diagnosis present

## 2021-09-01 DIAGNOSIS — I129 Hypertensive chronic kidney disease with stage 1 through stage 4 chronic kidney disease, or unspecified chronic kidney disease: Secondary | ICD-10-CM | POA: Diagnosis present

## 2021-09-01 DIAGNOSIS — N182 Chronic kidney disease, stage 2 (mild): Secondary | ICD-10-CM | POA: Diagnosis present

## 2021-09-01 DIAGNOSIS — K219 Gastro-esophageal reflux disease without esophagitis: Secondary | ICD-10-CM | POA: Diagnosis present

## 2021-09-01 DIAGNOSIS — Z96652 Presence of left artificial knee joint: Secondary | ICD-10-CM | POA: Diagnosis present

## 2021-09-01 DIAGNOSIS — B9620 Unspecified Escherichia coli [E. coli] as the cause of diseases classified elsewhere: Secondary | ICD-10-CM | POA: Diagnosis present

## 2021-09-01 DIAGNOSIS — Z6841 Body Mass Index (BMI) 40.0 and over, adult: Secondary | ICD-10-CM | POA: Diagnosis not present

## 2021-09-01 DIAGNOSIS — E0500 Thyrotoxicosis with diffuse goiter without thyrotoxic crisis or storm: Secondary | ICD-10-CM | POA: Diagnosis present

## 2021-09-01 LAB — BASIC METABOLIC PANEL
Anion gap: 8 (ref 5–15)
BUN: 13 mg/dL (ref 8–23)
CO2: 27 mmol/L (ref 22–32)
Calcium: 8.3 mg/dL — ABNORMAL LOW (ref 8.9–10.3)
Chloride: 101 mmol/L (ref 98–111)
Creatinine, Ser: 1.44 mg/dL — ABNORMAL HIGH (ref 0.44–1.00)
GFR, Estimated: 40 mL/min — ABNORMAL LOW (ref >60)
Glucose, Bld: 249 mg/dL — ABNORMAL HIGH (ref 70–99)
Potassium: 3.9 mmol/L (ref 3.5–5.1)
Sodium: 136 mmol/L (ref 135–145)

## 2021-09-01 LAB — GLUCOSE, CAPILLARY
Glucose-Capillary: 258 mg/dL — ABNORMAL HIGH (ref 70–99)
Glucose-Capillary: 176 mg/dL — ABNORMAL HIGH (ref 70–99)
Glucose-Capillary: 142 mg/dL — ABNORMAL HIGH (ref 70–99)
Glucose-Capillary: 200 mg/dL — ABNORMAL HIGH (ref 70–99)

## 2021-09-01 LAB — CBC
HCT: 36 % (ref 36.0–46.0)
Hemoglobin: 11.7 g/dL — ABNORMAL LOW (ref 12.0–15.0)
MCH: 28.5 pg (ref 26.0–34.0)
MCHC: 32.5 g/dL (ref 30.0–36.0)
MCV: 87.6 fL (ref 80.0–100.0)
Platelets: 185 10*3/uL (ref 150–400)
RBC: 4.11 MIL/uL (ref 3.87–5.11)
RDW: 15.5 % (ref 11.5–15.5)
WBC: 11.5 10*3/uL — ABNORMAL HIGH (ref 4.0–10.5)
nRBC: 0 % (ref 0.0–0.2)

## 2021-09-01 LAB — HIV ANTIBODY (ROUTINE TESTING W REFLEX): HIV Screen 4th Generation wRfx: NONREACTIVE

## 2021-09-01 MED ORDER — PANTOPRAZOLE SODIUM 40 MG IV SOLR
40.0000 mg | Freq: Two times a day (BID) | INTRAVENOUS | Status: DC
  Administered 2021-09-01 – 2021-09-02 (×3): 40 mg via INTRAVENOUS
  Filled 2021-09-01 (×3): qty 10

## 2021-09-01 MED ORDER — LACTATED RINGERS IV SOLN: INTRAVENOUS | Status: DC

## 2021-09-01 MED ORDER — MORPHINE SULFATE (PF) 2 MG/ML IV SOLN
2.0000 mg | INTRAVENOUS | Status: DC | PRN
  Administered 2021-09-02: 2 mg via INTRAVENOUS
  Filled 2021-09-01 (×2): qty 1

## 2021-09-01 MED ORDER — DICLOFENAC SODIUM 1 % EX GEL
2.0000 g | Freq: Four times a day (QID) | CUTANEOUS | Status: DC
  Administered 2021-09-01 – 2021-09-05 (×16): 2 g via TOPICAL
  Filled 2021-09-01: qty 100

## 2021-09-01 MED ORDER — INSULIN GLARGINE-YFGN 100 UNIT/ML ~~LOC~~ SOLN
10.0000 [IU] | Freq: Every day | SUBCUTANEOUS | Status: DC
  Administered 2021-09-01 – 2021-09-09 (×9): 10 [IU] via SUBCUTANEOUS
  Filled 2021-09-01 (×11): qty 0.1

## 2021-09-01 MED ORDER — ONDANSETRON HCL 4 MG PO TABS: 4.0000 mg | ORAL_TABLET | Freq: Four times a day (QID) | ORAL | Status: DC | PRN

## 2021-09-01 MED ORDER — OXYCODONE HCL 5 MG PO TABS
5.0000 mg | ORAL_TABLET | ORAL | Status: DC | PRN
  Administered 2021-09-01 – 2021-09-07 (×16): 5 mg via ORAL
  Filled 2021-09-01 (×18): qty 1

## 2021-09-01 MED ORDER — ONDANSETRON HCL 4 MG/2ML IJ SOLN: 4.0000 mg | Freq: Four times a day (QID) | INTRAMUSCULAR | Status: DC | PRN

## 2021-09-01 NOTE — Assessment & Plan Note (Addendum)
-   Continue carvedilol, amlodipine - Hold benazepril given AKI

## 2021-09-01 NOTE — Progress Notes (Addendum)
Progress Note   Patient: [name] KYH:[id number] DOB: [birth year] DOA: 08/31/2021     0 DOS: the patient was seen and examined on 09/01/2021   Brief hospital course: Mrs. Cordell was admitted to the hospital with the working diagnosis of acute gastritis.   66 yo female with the past medical history of hypertension, T2DM, dyslipidemia, CKD and atrial fibrillation who presented with abdominal pain. Patient reported acute onset of abdominal pain that woke her up from her sleep. Her pain was epigastric and associated with nausea and vomiting. On her initial physical examination her blood pressure was 207/96, HR 106, RR 24 and 02 saturation 92%, lungs were clear to auscultation, heart with S1 and S2 present and rhythmic, abdomen tender to palpation with no rebound or guarding, no lower extremity edema.   Na 140, K 3,8 Cl 103, bicarbonate 25 glucose 253 bun 14 cr 0,97 Lipase 45, AST 30 ALT 21  Wbc 8,5 hgb 13,0 plt 202 UA SG 1,010, 100 protein, 0-5 wbc   CT abdomen and pelvis with no acute changes.  Cholelithiasis without gallbladder thickening or biliary duct dilatation.   EKG 101 bpm, normal axis, normal intervals, sinus rhythm with no significant ST segment changes, negative T wave V4 to V6.   Patient was placed on proton pump inhibitors IV, as needed antiemetics and analgesics.  Clear liquid diet and consulted gastroenterology.     Assessment and Plan: * Acute gastritis GERD.  Patient with improvement in abdominal pain but not back to baseline, no further nausea or vomiting.   Plan to continue with IV proton pump inhibitor, 40 mg pantoprazole q12 hrs Continue pain control with as needed morphine and nausea control with zofran Continue IV fluids. [place] negative for cholecystitis, plan to further advance diet at tolerated.  Out of bed to chair tid with meals, Pt and Ot.   Patient continue to have fever and leukocytosis.  No clinical signs of bacterial infection, will continue to  hold on antibiotic therapy for now.  Check blood cultures.  Follow up cell count and temperature curve.    Acute kidney injury superimposed on chronic kidney disease (HCC) CKD stage 2.  Renal function with serum cr at 1,44, K is 3,9 and serum bicarbonate at 27,  Plan to continue IV fluids with balanced electrolytes solutions at 75 ml per hr Follow up renal function in am, avoid hypotension and nephrotoxic medications.  Advance diet as tolerated.   Type 2 diabetes mellitus with hyperlipidemia (HCC) Hyperglycemia.   Glucose is 249 today, fasting.  Patient with improved appetite.  Plan to advance diet as tolerated, continue insulin therapy with sliding scale and resume basal insulin with 10 units, at home on 75/25 12 units bid.   Hypertension Continue blood pressure control with carvedilol, benazepril, carvedilol and amlodipine.   Hypothyroidism Continue with levothyroxine.   OSA (obstructive sleep apnea) Continue Cpap.   Malignant neoplasm of upper-outer quadrant of right breast in female, estrogen receptor positive ([place]) Follow up as outpatient.   H/O atrial flutter Continue anticoagulation with apixaban, patient on carvedilol for rate control.   Class 3 obesity (HCC) Calculated BMI is 54,7         Subjective: Patient with improvement in abdominal pain but not back to baseline, no nausea or vomiting.   Physical Exam: Vitals:   09/01/21 0500 09/01/21 0530 09/01/21 0619 09/01/21 0727  BP:  (!) 141/50    Pulse:  97    Resp:  18    Temp:  (!)  101.6 F (38.7 C) (!) 100.8 F (38.2 C)   TempSrc:  Oral Oral   SpO2:  95%  90%  Weight: (!) 147 kg     Height: 5' 4.5" (1.638 m)      Neurology awake and alert ENT with no pallor Cardiovascular with S1 and S2 present and rhythmic with no gallops Respiratory with no rales or rhonchi Abdomen not distended and not tender to superficial palpation  No lower extremity edema No rashes  Data Reviewed:    Family  Communication: no family at the bedside   Disposition: Status is: Observation The patient will require care spanning > 2 midnights and should be moved to inpatient because: IV fluids and close monitoring fever and wbc   Planned Discharge Destination: Home     Author: Tawni Millers, MD 09/01/2021 11:44 AM  For on call review [email or web address].

## 2021-09-01 NOTE — Hospital Course (Addendum)
Mrs. Bilger was admitted to the hospital with the working diagnosis of acute gastritis.   66 yo female with the past medical history of hypertension, T2DM, dyslipidemia, CKD and atrial fibrillation who presented with abdominal pain. Patient reported acute onset of abdominal pain that woke her up from her sleep. Her pain was epigastric and associated with nausea and vomiting. On her initial physical examination her blood pressure was 207/96, HR 106, RR 24 and 02 saturation 92%, lungs were clear to auscultation, heart with S1 and S2 present and rhythmic, abdomen tender to palpation with no rebound or guarding, no lower extremity edema.   She was stable for discharge 8/23 as her gastritis had improved and she was noted to have UTI with E. coli that was adequately treated.  Unfortunately she could not transfer to her vehicle with significant right knee osteoarthritis symptoms.  Therefore she was kept in the hospital for further PT evaluation.  PT now recommending SNF on discharge.  Unfortunately she continues to have some ongoing fevers overnight with unknown origin.  Blood cultures remain negative at this time and procalcitonin low.  Continue to follow CBC.  ID consulted for further evaluation and management and this is pending.  Patient and family requesting transfer to [facility] as she has been previously hospitalized there and does not want to stay at [facility].  Transfer order has been placed.

## 2021-09-01 NOTE — Assessment & Plan Note (Addendum)
GERD.  Patient with improvement in abdominal pain but not back to baseline, no further nausea or vomiting.   Plan to continue with IV proton pump inhibitor, 40 mg pantoprazole q12 hrs Continue pain control with as needed morphine and nausea control with zofran Continue IV fluids. [place] negative for cholecystitis, plan to further advance diet at tolerated.  Out of bed to chair tid with meals, Pt and Ot.   Patient continue to have fever and leukocytosis.  No clinical signs of bacterial infection, will continue to hold on antibiotic therapy for now.  Check blood cultures.  Follow up cell count and temperature curve.

## 2021-09-01 NOTE — Assessment & Plan Note (Signed)
Continue Cpap

## 2021-09-01 NOTE — Assessment & Plan Note (Addendum)
Continue levothyroxine 

## 2021-09-01 NOTE — Assessment & Plan Note (Addendum)
Creatinine 1.8 on admission, improved to 1.2 with fluids

## 2021-09-01 NOTE — Progress Notes (Signed)
Patient using home CPAP independently.  

## 2021-09-01 NOTE — Progress Notes (Signed)
Maylon Peppers, M.D. Gastroenterology & Hepatology   Interval History:  Patient presented multiple fever spikes during the night with highest fever spike of 101.4.  She was treated with Tylenol for this. States that her abdominal pain has improved and she has not requested any pain medication since yesterday night.  Endorses having some soreness in her upper abdomen but no nausea, vomiting, melena, hematochezia or abdominal distention.  Has been tolerating a clear liquid diet today in the morning. Labs today showed mild leukocytosis of 11,500 with hemoglobin mildly decreased to 11.7.  She also had mild increase in her creatinine up to 144. Inpatient Medications:  Current Facility-Administered Medications:    0.9 % NaCl with KCl 20 mEq/ L  infusion, , Intravenous, Continuous, Evangeline Gula, Wyatt Haste, MD, Last Rate: 75 mL/hr at 08/31/21 2122, New Bag at 08/31/21 2122   acetaminophen (TYLENOL) tablet 650 mg, 650 mg, Oral, Q6H PRN, 650 mg at 09/01/21 0532 **OR** acetaminophen (TYLENOL) suppository 650 mg, 650 mg, Rectal, Q6H PRN, Lady Deutscher, MD   albuterol (PROVENTIL) (2.5 MG/3ML) 0.083% nebulizer solution 2.5 mg, 2.5 mg, Nebulization, Q6H PRN, Lady Deutscher, MD   amLODipine (NORVASC) tablet 10 mg, 10 mg, Oral, Daily, Lady Deutscher, MD, 10 mg at 09/01/21 0086   apixaban (ELIQUIS) tablet 5 mg, 5 mg, Oral, BID, Lady Deutscher, MD, 5 mg at 09/01/21 7619   benazepril (LOTENSIN) tablet 40 mg, 40 mg, Oral, Daily, Lady Deutscher, MD, 40 mg at 09/01/21 0833   budesonide (PULMICORT) nebulizer solution 0.25 mg, 0.25 mg, Nebulization, BID, Lady Deutscher, MD, 0.25 mg at 09/01/21 5093   carvedilol (COREG) tablet 12.5 mg, 12.5 mg, Oral, BID WC, Lady Deutscher, MD, 12.5 mg at 09/01/21 2671   insulin aspart (novoLOG) injection 0-20 Units, 0-20 Units, Subcutaneous, TID WC, Lady Deutscher, MD, 11 Units at 09/01/21 2458   insulin aspart protamine- aspart (NOVOLOG MIX 70/30) injection  12 Units, 12 Units, Subcutaneous, BID WC, Lady Deutscher, MD   levothyroxine (SYNTHROID) tablet 100 mcg, 100 mcg, Oral, QAC breakfast, Lady Deutscher, MD, 100 mcg at 09/01/21 0998   metoCLOPramide (REGLAN) injection 10 mg, 10 mg, Intravenous, Q6H PRN, Lady Deutscher, MD   morphine (PF) 2 MG/ML injection 2 mg, 2 mg, Intravenous, Q2H PRN, Lady Deutscher, MD   morphine (PF) 4 MG/ML injection 4 mg, 4 mg, Intravenous, Q2H PRN, Evangeline Gula, Wyatt Haste, MD   ondansetron (ZOFRAN) tablet 8 mg, 8 mg, Oral, Q6H PRN **OR** ondansetron (ZOFRAN) 8 mg in sodium chloride 0.9 % 50 mL IVPB, 8 mg, Intravenous, Q6H PRN, Lady Deutscher, MD   [START ON 09/04/2021] pantoprazole (PROTONIX) injection 40 mg, 40 mg, Intravenous, Q12H, Evangeline Gula, Wyatt Haste, MD   pantoprozole (PROTONIX) 80 mg /NS 100 mL infusion, 8 mg/hr, Intravenous, Continuous, Evangeline Gula, Wyatt Haste, MD, Last Rate: 10 mL/hr at 08/31/21 2339, 8 mg/hr at 08/31/21 2339   polyethylene glycol (MIRALAX / GLYCOLAX) packet 17 g, 17 g, Oral, Daily PRN, Lady Deutscher, MD   pravastatin (PRAVACHOL) tablet 40 mg, 40 mg, Oral, QHS, Lady Deutscher, MD, 40 mg at 08/31/21 2250   pregabalin (LYRICA) capsule 100 mg, 100 mg, Oral, Q8H, Lady Deutscher, MD, 100 mg at 09/01/21 0524   traZODone (DESYREL) tablet 50 mg, 50 mg, Oral, QHS PRN, Lady Deutscher, MD   I/O    Intake/Output Summary (Last 24 hours) at 09/01/2021 0857 Last data filed at 08/31/2021 1307 Gross per 24 hour  Intake 35.97 ml  Output --  Net 35.97 ml     Physical Exam: Temp:  [98.1 F (36.7 C)-101.6 F (38.7 C)] 100.8 F (38.2 C) (08/20 0619) Pulse Rate:  [70-106] 97 (08/20 0530) Resp:  [0-24] 18 (08/20 0530) BP: (133-220)/(50-98) 141/50 (08/20 0530) SpO2:  [90 %-100 %] 90 % (08/20 0727) Weight:  [145.2 kg-147 kg] 147 kg (08/20 0500)  Temp (24hrs), Avg:99.8 F (37.7 C), Min:98.1 F (36.7 C), Max:101.6 F (38.7 C) GENERAL: The patient is AO x3, in no acute distress.  Obese. HEENT: Head is normocephalic and atraumatic. EOMI are intact. Mouth is well hydrated and without lesions. NECK: Supple. No masses LUNGS: Clear to auscultation. No presence of rhonchi/wheezing/rales. Adequate chest expansion HEART: RRR, normal s1 and s2. ABDOMEN: non tender, no guarding, no peritoneal signs, and nondistended. BS +. No masses. EXTREMITIES: Without any cyanosis, clubbing, rash, lesions or edema. NEUROLOGIC: AOx3, no focal motor deficit. SKIN: no jaundice, no rashes  Laboratory Data: CBC:     Component Value Date/Time   WBC 11.5 (H) 09/01/2021 0501   RBC 4.11 09/01/2021 0501   HGB 11.7 (L) 09/01/2021 0501   HGB 12.8 05/30/2021 1116   HGB 12.2 03/06/2016 1252   HCT 36.0 09/01/2021 0501   HCT 38.9 05/30/2021 1116   HCT 36.5 03/06/2016 1252   PLT 185 09/01/2021 0501   PLT 252 05/30/2021 1116   MCV 87.6 09/01/2021 0501   MCV 86 05/30/2021 1116   MCV 84.1 03/06/2016 1252   MCH 28.5 09/01/2021 0501   MCHC 32.5 09/01/2021 0501   RDW 15.5 09/01/2021 0501   RDW 15.2 05/30/2021 1116   RDW 15.7 (H) 03/06/2016 1252   LYMPHSABS 1.3 08/31/2021 0944   LYMPHSABS 1.4 05/15/2020 1052   LYMPHSABS 1.7 03/06/2016 1252   MONOABS 0.4 08/31/2021 0944   MONOABS 0.5 03/06/2016 1252   EOSABS 0.1 08/31/2021 0944   EOSABS 0.1 05/15/2020 1052   BASOSABS 0.0 08/31/2021 0944   BASOSABS 0.0 05/15/2020 1052   BASOSABS 0.0 03/06/2016 1252   COAG:  Lab Results  Component Value Date   INR 1.29 09/07/2013   INR 1.1 03/08/2008    BMP:     Latest Ref Rng & Units 09/01/2021    5:01 AM 08/31/2021    9:44 AM 05/30/2021   11:16 AM  BMP  Glucose 70 - 99 mg/dL 249  213  160   BUN 8 - 23 mg/dL _0 Creatinine 0.44 - 1.00 mg/dL 1.44  0.97  1.13   BUN/Creat Ratio 12 - 28   14   Sodium 135 - 145 mmol/L 136  140  143   Potassium 3.5 - 5.1 mmol/L 3.9  3.8  4.2   Chloride 98 - 111 mmol/L 101  103  103   CO2 22 - 32 mmol/L _1 Calcium 8.9 - 10.3 mg/dL 8.3  9.3  9.3      HEPATIC:     Latest Ref Rng & Units 08/31/2021    9:44 AM 05/30/2021   11:16 AM 05/15/2020   10:52 AM  Hepatic Function  Total Protein 6.5 - 8.1 g/dL 8.6  7.7  7.7   Albumin 3.5 - 5.0 g/dL 4.3  4.5  4.5   AST 15 - 41 U/L _2 ALT 0 - 44 U/L _3 Alk Phosphatase 38 - 126 U/L 84  88  77   Total Bilirubin 0.3 -  1.2 mg/dL 0.8  0.3  0.4     CARDIAC:  Lab Results  Component Value Date   TROPONINI <0.30 11/04/2013      Imaging: I personally reviewed and interpreted the available labs, imaging and endoscopic files.   Assessment/Plan: ELIZE PINON is a  66 y.o. female with history of asthma, right breast cancer, GERD, Graves' disease, hyperlipidemia, hypertension, insulin-dependent diabetes, osteoarthritis, sleep apnea, obesity, who comes to the hospital after presenting acute onset of abdominal pain and nausea with vomiting.   The patient had acute onset of symptoms of nausea abdominal pain in the epigastric area which did not respond to initial management with famotidine and antiemetics.   She is currently feeling better and has not requested any more pain medication after she was started on PPI.  It is unclear why she has presented her symptoms, as her blood work-up has been unremarkable and her urinalysis only showed presence of bacteriuria, with presence of cholelithiasis in her CT scan.  She has n presented with recurrent fevers overnight which raises concern for acute infectious etiology.  I do consider important to evaluate further if there are changes consistent with cholecystitis with a right upper quadrant ultrasound.  Also will need to follow the urine urine culture results to determine if there is an infectious etiology causing her symptoms.  If this is negative but she persists with fever, may need to consider performing blood cultures as well.   From the gastrointestinal perspective, she has improved with the use of pantoprazole IV which she should continue for  now, can consider switching her to twice a day dosing instead of drip.  If her abdominal pain persists despite this, can consider performing an upper endoscopy while hospitalized but as she has improved we will continue with this medication and possibly advancing her diet.  She can try combination of Zofran and Reglan as needed to improve her nausea.   - RUQ [place] - Follow urine culture -Pantoprazole 40 mg BID IV - May consider EGD if pain not improving - Zofran as needed for nausea -Reglan 5 mg every  8 h as needed if refractory to Zofran - Advance diet as needed  Maylon Peppers, MD Gastroenterology and Hepatology [facility]

## 2021-09-01 NOTE — TOC Progression Note (Signed)
  Transition of Care [facility]) Screening Note   Patient Details  Name: [name] Date of Birth: 23-Feb-1955   Transition of Care [facility]) CM/SW Contact:    Boneta Lucks, RN Phone Number: 09/01/2021, 1:13 PM    Transition of Care Department [facility]) has reviewed patient and no TOC needs have been identified at this time. We will continue to monitor patient advancement through interdisciplinary progression rounds. If new patient transition needs arise, please place a TOC consult.       Barriers to Discharge: Continued Medical Work up  Expected Discharge Plan and Services

## 2021-09-01 NOTE — Assessment & Plan Note (Signed)
Calculated BMI is 54,7

## 2021-09-01 NOTE — Assessment & Plan Note (Addendum)
Had some hypoglycemia earlier in the hospitalization, none today - Continue Semglee - Continue sliding scale corrections

## 2021-09-01 NOTE — Assessment & Plan Note (Addendum)
-   Continue Eliquis - Continue carvedilol 

## 2021-09-01 NOTE — Assessment & Plan Note (Signed)
Follow up as outpatient.  

## 2021-09-02 ENCOUNTER — Telehealth: Payer: Self-pay | Admitting: Gastroenterology

## 2021-09-02 DIAGNOSIS — K2900 Acute gastritis without bleeding: Secondary | ICD-10-CM | POA: Diagnosis not present

## 2021-09-02 LAB — BASIC METABOLIC PANEL
Anion gap: 10 (ref 5–15)
BUN: 23 mg/dL (ref 8–23)
CO2: 23 mmol/L (ref 22–32)
Calcium: 7.8 mg/dL — ABNORMAL LOW (ref 8.9–10.3)
Chloride: 102 mmol/L (ref 98–111)
Creatinine, Ser: 1.78 mg/dL — ABNORMAL HIGH (ref 0.44–1.00)
GFR, Estimated: 31 mL/min — ABNORMAL LOW (ref >60)
Glucose, Bld: 176 mg/dL — ABNORMAL HIGH (ref 70–99)
Potassium: 3.5 mmol/L (ref 3.5–5.1)
Sodium: 135 mmol/L (ref 135–145)

## 2021-09-02 LAB — CBC WITH DIFFERENTIAL/PLATELET
Abs Immature Granulocytes: 0.06 10*3/uL (ref 0.00–0.07)
Basophils Absolute: 0 10*3/uL (ref 0.0–0.1)
Basophils Relative: 0 %
Eosinophils Absolute: 0.1 10*3/uL (ref 0.0–0.5)
Eosinophils Relative: 1 %
HCT: 33.6 % — ABNORMAL LOW (ref 36.0–46.0)
Hemoglobin: 11 g/dL — ABNORMAL LOW (ref 12.0–15.0)
Immature Granulocytes: 1 %
Lymphocytes Relative: 13 %
Lymphs Abs: 1.6 10*3/uL (ref 0.7–4.0)
MCH: 28.3 pg (ref 26.0–34.0)
MCHC: 32.7 g/dL (ref 30.0–36.0)
MCV: 86.4 fL (ref 80.0–100.0)
Monocytes Absolute: 1.1 10*3/uL — ABNORMAL HIGH (ref 0.1–1.0)
Monocytes Relative: 9 %
Neutro Abs: 9.2 10*3/uL — ABNORMAL HIGH (ref 1.7–7.7)
Neutrophils Relative %: 76 %
Platelets: 168 10*3/uL (ref 150–400)
RBC: 3.89 MIL/uL (ref 3.87–5.11)
RDW: 15.3 % (ref 11.5–15.5)
WBC: 12 10*3/uL — ABNORMAL HIGH (ref 4.0–10.5)
nRBC: 0 % (ref 0.0–0.2)

## 2021-09-02 LAB — GLUCOSE, CAPILLARY
Glucose-Capillary: 174 mg/dL — ABNORMAL HIGH (ref 70–99)
Glucose-Capillary: 218 mg/dL — ABNORMAL HIGH (ref 70–99)
Glucose-Capillary: 178 mg/dL — ABNORMAL HIGH (ref 70–99)
Glucose-Capillary: 186 mg/dL — ABNORMAL HIGH (ref 70–99)

## 2021-09-02 MED ORDER — PANTOPRAZOLE SODIUM 40 MG PO TBEC
40.0000 mg | DELAYED_RELEASE_TABLET | Freq: Two times a day (BID) | ORAL | Status: DC
  Administered 2021-09-02 – 2021-09-09 (×14): 40 mg via ORAL
  Filled 2021-09-02 (×14): qty 1

## 2021-09-02 NOTE — Telephone Encounter (Signed)
[name]:  Patient needs outpatient hospital follow-up with [place] to discuss EGD.

## 2021-09-02 NOTE — Progress Notes (Signed)
PROGRESS NOTE    [name]  MVE:[id number] DOB: [birth year] DOA: 08/31/2021 PCP: Coral Spikes, DO   Brief Narrative:  [name] was admitted to the hospital with the working diagnosis of acute gastritis.   66 yo female with the past medical history of hypertension, T2DM, dyslipidemia, CKD and atrial fibrillation who presented with abdominal pain. Patient reported acute onset of abdominal pain that woke her up from her sleep. Her pain was epigastric and associated with nausea and vomiting. On her initial physical examination her blood pressure was 207/96, HR 106, RR 24 and 02 saturation 92%, lungs were clear to auscultation, heart with S1 and S2 present and rhythmic, abdomen tender to palpation with no rebound or guarding, no lower extremity edema.   Na 140, K 3,8 Cl 103, bicarbonate 25 glucose 253 bun 14 cr 0,97 Lipase 45, AST 30 ALT 21  Wbc 8,5 hgb 13,0 plt 202 UA SG 1,010, 100 protein, 0-5 wbc   CT abdomen and pelvis with no acute changes.  Cholelithiasis without gallbladder thickening or biliary duct dilatation.   EKG 101 bpm, normal axis, normal intervals, sinus rhythm with no significant ST segment changes, negative T wave V4 to V6.   Patient was placed on proton pump inhibitors IV, as needed antiemetics and analgesics.  Clear liquid diet and consulted gastroenterology.   [place] right upper quadrant with cholelithiasis but not cholecystitis.     Assessment & Plan:   Principal Problem:   Acute gastritis Active Problems:   Acute kidney injury superimposed on chronic kidney disease (HCC)   Type 2 diabetes mellitus with hyperlipidemia (HCC)   Hypertension   Hypothyroidism   OSA (obstructive sleep apnea)   Malignant neoplasm of upper-outer quadrant of right breast in female, estrogen receptor positive (HCC)   H/O atrial flutter   Class 3 obesity ([place])  Assessment and Plan:   Acute gastritis-improved GERD.   Patient with improvement in abdominal pain but not back  to baseline, no further nausea or vomiting.    Plan to continue with IV proton pump inhibitor, 40 mg pantoprazole q12 hrs, may switch to oral per GI and the soft diet Continue pain control with as needed morphine and nausea control with zofran Continue IV fluids. [place] negative for cholecystitis, plan to further advance diet at tolerated.  Out of bed to chair tid with meals, Pt and Ot.    Patient continue to have fever and leukocytosis improving  No clinical signs of bacterial infection, will continue to hold on antibiotic therapy for now.  Blood cultures with no growth Follow up cell count and temperature curve.  Likely EGD in the outpatient setting at this point since she is clinically improved     Acute kidney injury superimposed on chronic kidney disease ([place]) CKD stage 2.   Creatinine uptrending, hold ACE inhibitor and continue IV fluid   Plan to continue IV fluids with balanced electrolytes solutions at 75 ml per hr Follow up renal function in am, avoid hypotension and nephrotoxic medications.  Advance diet as tolerated.  Good urine output noted   Type 2 diabetes mellitus with hyperlipidemia (HCC) Hyperglycemia.    Glucose is 249 today, fasting.  Patient with improved appetite.  Plan to advance diet as tolerated, continue insulin therapy with sliding scale and resume basal insulin with 10 units, at home on 75/25 12 units bid.    Hypertension Continue blood pressure control with carvedilol, and amlodipine and hold benazepril given AKI   Hypothyroidism Continue with levothyroxine.  OSA (obstructive sleep apnea) Continue Cpap.    Malignant neoplasm of upper-outer quadrant of right breast in female, estrogen receptor positive ([place]) Follow up as outpatient.    H/O atrial flutter Continue anticoagulation with apixaban, patient on carvedilol for rate control.     Class 3 obesity (HCC) Calculated BMI is 54,7      DVT prophylaxis: Eliquis Code Status: Full Family  Communication: None at bedside Disposition Plan:  Status is: Inpatient Remains inpatient appropriate because: Need for IV fluid.   Consultants:  GI  Procedures:  None  Antimicrobials:  None   Subjective: Patient seen and evaluated today with overall improvement in abdominal pain noted.  She appears to be tolerating diet.  No fevers noted overnight.  Objective: Vitals:   09/01/21 2209 09/02/21 0500 09/02/21 0711 09/02/21 0916  BP: (!) 116/53 (!) 100/47  117/60  Pulse: 88 79  84  Resp: 20 20    Temp: 98.2 F (36.8 C) 98 F (36.7 C)    TempSrc:  Oral    SpO2: 92% 94% 95%   Weight:  (!) 145.8 kg    Height:        Intake/Output Summary (Last 24 hours) at 09/02/2021 1021 Last data filed at 09/01/2021 2200 Gross per 24 hour  Intake 809.71 ml  Output 1850 ml  Net -1040.29 ml   Filed Weights   08/31/21 2229 09/01/21 0500 09/02/21 0500  Weight: (!) 147 kg (!) 147 kg (!) 145.8 kg    Examination:  General exam: Appears calm and comfortable, obese Respiratory system: Clear to auscultation. Respiratory effort normal. Cardiovascular system: S1 & S2 heard, RRR.  Gastrointestinal system: Abdomen is soft Central nervous system: Alert and awake Extremities: No edema Skin: No significant lesions noted Psychiatry: Flat affect.    Data Reviewed: I have personally reviewed following labs and imaging studies  CBC: Recent Labs  Lab 08/31/21 0944 09/01/21 0501 09/02/21 0551  WBC 8.5 11.5* 12.0*  NEUTROABS 6.6  --  9.2*  HGB 13.0 11.7* 11.0*  HCT 39.9 36.0 33.6*  MCV 86.6 87.6 86.4  PLT 202 185 366   Basic Metabolic Panel: Recent Labs  Lab 08/31/21 0944 09/01/21 0501 09/02/21 0551  NA 140 136 135  K 3.8 3.9 3.5  CL 103 101 102  CO2 '25 27 23  '$ GLUCOSE 213* 249* 176*  BUN '14 13 23  '$ CREATININE 0.97 1.44* 1.78*  CALCIUM 9.3 8.3* 7.8*   GFR: Estimated Creatinine Clearance: 44.5 mL/min (A) (by C-G formula based on SCr of 1.78 mg/dL (H)). Liver Function  Tests: Recent Labs  Lab 08/31/21 0944  AST 30  ALT 21  ALKPHOS 84  BILITOT 0.8  PROT 8.6*  ALBUMIN 4.3   Recent Labs  Lab 08/31/21 0944  LIPASE 45   No results for input(s): "AMMONIA" in the last 168 hours. Coagulation Profile: No results for input(s): "INR", "PROTIME" in the last 168 hours. Cardiac Enzymes: No results for input(s): "CKTOTAL", "CKMB", "CKMBINDEX", "TROPONINI" in the last 168 hours. BNP (last 3 results) No results for input(s): "PROBNP" in the last 8760 hours. HbA1C: No results for input(s): "HGBA1C" in the last 72 hours. CBG: Recent Labs  Lab 09/01/21 0728 09/01/21 1117 09/01/21 1603 09/01/21 2213 09/02/21 0741  GLUCAP 258* 176* 142* 200* 174*   Lipid Profile: No results for input(s): "CHOL", "HDL", "LDLCALC", "TRIG", "CHOLHDL", "LDLDIRECT" in the last 72 hours. Thyroid Function Tests: No results for input(s): "TSH", "T4TOTAL", "FREET4", "T3FREE", "THYROIDAB" in the last 72 hours. Anemia Panel: No  results for input(s): "VITAMINB12", "FOLATE", "FERRITIN", "TIBC", "IRON", "RETICCTPCT" in the last 72 hours. Sepsis Labs: No results for input(s): "PROCALCITON", "LATICACIDVEN" in the last 168 hours.  Recent Results (from the past 240 hour(s))  Urine Culture     Status: Abnormal (Preliminary result)   Collection Time: 08/31/21 10:36 AM   Specimen: Urine, Clean Catch  Result Value Ref Range Status   Specimen Description   Final    URINE, CLEAN CATCH Performed at [facility], [address] [place], [place] [address]    Special Requests   Final    NONE Performed at [facility], [address], [place], [place] [address]    Culture >=100,000 COLONIES/mL GRAM NEGATIVE RODS (A)  Final   Report Status PENDING  Incomplete  Culture, blood (Routine X 2) w Reflex to ID Panel     Status: None (Preliminary result)   Collection Time: 09/01/21 12:01 PM   Specimen: BLOOD LEFT HAND  Result Value Ref Range Status   Specimen Description   Final    BLOOD LEFT  HAND BOTTLES DRAWN AEROBIC AND ANAEROBIC   Special Requests   Final    Blood Culture results may not be optimal due to an excessive volume of blood received in culture bottles   Culture   Final    NO GROWTH < 12 HOURS Performed at [facility], [address], [place], [place] [address]    Report Status PENDING  Incomplete  Culture, blood (Routine X 2) w Reflex to ID Panel     Status: None (Preliminary result)   Collection Time: 09/01/21 12:14 PM   Specimen: BLOOD LEFT FOREARM  Result Value Ref Range Status   Specimen Description   Final    BLOOD LEFT FOREARM BOTTLES DRAWN AEROBIC AND ANAEROBIC   Special Requests   Final    Blood Culture results may not be optimal due to an excessive volume of blood received in culture bottles   Culture   Final    NO GROWTH < 12 HOURS Performed at [facility], [address] [place],  [address]    Report Status PENDING  Incomplete         Radiology Studies: US Abdomen Limited RUQ (LIVER/GB)  Result Date: 09/01/2021 CLINICAL DATA:  Epigastric pain EXAM: ULTRASOUND ABDOMEN LIMITED RIGHT UPPER QUADRANT COMPARISON:  CT AP 08/31/2021 FINDINGS: Gallbladder: Gallstones noted measuring up to 1.9 cm. No gallbladder wall thickening, pericholecystic fluid or sonographic Murphy's sign. Common bile duct: Diameter: 4.2 mm.  No intrahepatic bile duct dilatation. Liver: No focal lesion identified. Increased parenchymal echogenicity suggesting hepatic steatosis. Portal vein is patent on color Doppler imaging with normal direction of blood flow towards the liver. Other: Exam detail is diminished secondary to patient body habitus. IMPRESSION: 1. Gallstones.  There are no secondary signs of acute cholecystitis. 2. Hepatic steatosis. Electronically Signed   By: Kerby Moors M.D.   On: 09/01/2021 10:47   CT ABDOMEN PELVIS W CONTRAST  Result Date: 08/31/2021 CLINICAL DATA:  Epigastric pain. EXAM: CT ABDOMEN AND PELVIS WITH CONTRAST TECHNIQUE: Multidetector CT  imaging of the abdomen and pelvis was performed using the standard protocol following bolus administration of intravenous contrast. RADIATION DOSE REDUCTION: This exam was performed according to the departmental dose-optimization program which includes automated exposure control, adjustment of the mA and/or kV according to patient size and/or use of iterative reconstruction technique. CONTRAST:  143m OMNIPAQUE IOHEXOL 300 MG/ML  SOLN COMPARISON:  02/25/2013 FINDINGS: Lower chest: No acute abnormality. Hepatobiliary: No focal  liver abnormality is seen. Cholelithiasis without gallbladder wall thickening. No biliary ductal dilatation. Pancreas: Unremarkable. No pancreatic ductal dilatation or surrounding inflammatory changes. Spleen: Normal in size without focal abnormality. Adrenals/Urinary Tract: Adrenal glands are unremarkable. Kidneys are normal, without renal calculi, focal lesion, or hydronephrosis. Bladder is unremarkable. Stomach/Bowel: Stomach is within normal limits. No evidence of bowel wall thickening, distention, or inflammatory changes. Appendix is normal. Vascular/Lymphatic: Normal caliber abdominal aorta with mild atherosclerosis. No lymphadenopathy. Reproductive: Small calcified left uterine fibroid. Otherwise uterus and bilateral adnexa are unremarkable. Other: No abdominal wall hernia or abnormality. No abdominopelvic ascites. Musculoskeletal: No acute osseous abnormality. No aggressive osseous lesion. Mild osteoarthritis of bilateral SI joints. Degenerative disease with disc height loss at L5-S1. Moderate-severe bilateral facet arthropathy at L4-5 and L5-S1. IMPRESSION: 1. No acute abdominal or pelvic pathology. 2. Cholelithiasis without gallbladder wall thickening or biliary ductal dilatation. 3. Aortic Atherosclerosis (ICD10-I70.0). Electronically Signed   By: Kathreen Devoid M.D.   On: 08/31/2021 12:03        Scheduled Meds:  amLODipine  10 mg Oral Daily   apixaban  5 mg Oral BID    budesonide (PULMICORT) nebulizer solution  0.25 mg Nebulization BID   carvedilol  12.5 mg Oral BID WC   diclofenac Sodium  2 g Topical QID   insulin aspart  0-20 Units Subcutaneous TID WC   insulin glargine-yfgn  10 Units Subcutaneous Daily   levothyroxine  100 mcg Oral QAC breakfast   pantoprazole  40 mg Intravenous Q12H   pravastatin  40 mg Oral QHS   pregabalin  100 mg Oral Q8H   Continuous Infusions:  lactated ringers 75 mL/hr at 09/02/21 0920     LOS: 1 day    Time spent: 35 minutes    Catheline Hixon Darleen Crocker, DO [facility]  If 7PM-7AM, please contact night-coverage [email or web address] 09/02/2021, 10:21 AM

## 2021-09-02 NOTE — Progress Notes (Addendum)
Gastroenterology Progress Note    Primary Care Physician:  Coral Spikes, DO Primary Gastroenterologist:  Dr. Gala Romney  Patient ID: [name]; [id number]; [birth year]    Subjective   Abdominal pain resolved. No N/V. Ate eggs, toast, juice, diet soda, and peaches for breakfast. Only complaint is knee pain. Afebrile overnight. History of chronic constipation, having a BM 2-3 times a week. Last BM on Saturday prior to admission. Denies feeling bloated or constipated currently.    Objective   Vital signs in last 24 hours Temp:  [98 F (36.7 C)-99.4 F (37.4 C)] 98 F (36.7 C) (08/21 0500) Pulse Rate:  [79-89] 79 (08/21 0500) Resp:  [18-20] 20 (08/21 0500) BP: (100-139)/(47-63) 100/47 (08/21 0500) SpO2:  [92 %-95 %] 95 % (08/21 0711) Weight:  [145.8 kg] 145.8 kg (08/21 0500) Last BM Date : 08/31/21  Physical Exam General:   Alert and oriented, pleasant Head:  Normocephalic and atraumatic. Abdomen:  Bowel sounds present, obese, soft, no TTP. No rebound or guarding Extremities:  Without  edema. Neurologic:  Alert and  oriented x4    Intake/Output from previous day: 08/20 0701 - 08/21 0700 In: 1049.7 [P.O.:720; I.V.:236.9; IV Piggyback:92.8] Out: 1850 [Urine:1850] Intake/Output this shift: No intake/output data recorded.  Lab Results  Recent Labs    08/31/21 0944 09/01/21 0501 09/02/21 0551  WBC 8.5 11.5* 12.0*  HGB 13.0 11.7* 11.0*  HCT 39.9 36.0 33.6*  PLT 202 185 168   BMET Recent Labs    08/31/21 0944 09/01/21 0501 09/02/21 0551  NA 140 136 135  K 3.8 3.9 3.5  CL 103 101 102  CO2 '25 27 23  '$ GLUCOSE 213* 249* 176*  BUN '14 13 23  '$ CREATININE 0.97 1.44* 1.78*  CALCIUM 9.3 8.3* 7.8*   LFT Recent Labs    08/31/21 0944  PROT 8.6*  ALBUMIN 4.3  AST 30  ALT 21  ALKPHOS 84  BILITOT 0.8    Studies/Results US Abdomen Limited RUQ (LIVER/GB)  Result Date: 09/01/2021 CLINICAL DATA:  Epigastric pain EXAM: ULTRASOUND ABDOMEN LIMITED RIGHT UPPER  QUADRANT COMPARISON:  CT AP 08/31/2021 FINDINGS: Gallbladder: Gallstones noted measuring up to 1.9 cm. No gallbladder wall thickening, pericholecystic fluid or sonographic Murphy's sign. Common bile duct: Diameter: 4.2 mm.  No intrahepatic bile duct dilatation. Liver: No focal lesion identified. Increased parenchymal echogenicity suggesting hepatic steatosis. Portal vein is patent on color Doppler imaging with normal direction of blood flow towards the liver. Other: Exam detail is diminished secondary to patient body habitus. IMPRESSION: 1. Gallstones.  There are no secondary signs of acute cholecystitis. 2. Hepatic steatosis. Electronically Signed   By: Kerby Moors M.D.   On: 09/01/2021 10:47   CT ABDOMEN PELVIS W CONTRAST  Result Date: 08/31/2021 CLINICAL DATA:  Epigastric pain. EXAM: CT ABDOMEN AND PELVIS WITH CONTRAST TECHNIQUE: Multidetector CT imaging of the abdomen and pelvis was performed using the standard protocol following bolus administration of intravenous contrast. RADIATION DOSE REDUCTION: This exam was performed according to the departmental dose-optimization program which includes automated exposure control, adjustment of the mA and/or kV according to patient size and/or use of iterative reconstruction technique. CONTRAST:  146m OMNIPAQUE IOHEXOL 300 MG/ML  SOLN COMPARISON:  02/25/2013 FINDINGS: Lower chest: No acute abnormality. Hepatobiliary: No focal liver abnormality is seen. Cholelithiasis without gallbladder wall thickening. No biliary ductal dilatation. Pancreas: Unremarkable. No pancreatic ductal dilatation or surrounding inflammatory changes. Spleen: Normal in size without focal abnormality. Adrenals/Urinary Tract: Adrenal glands are unremarkable. Kidneys are normal, without  renal calculi, focal lesion, or hydronephrosis. Bladder is unremarkable. Stomach/Bowel: Stomach is within normal limits. No evidence of bowel wall thickening, distention, or inflammatory changes. Appendix is  normal. Vascular/Lymphatic: Normal caliber abdominal aorta with mild atherosclerosis. No lymphadenopathy. Reproductive: Small calcified left uterine fibroid. Otherwise uterus and bilateral adnexa are unremarkable. Other: No abdominal wall hernia or abnormality. No abdominopelvic ascites. Musculoskeletal: No acute osseous abnormality. No aggressive osseous lesion. Mild osteoarthritis of bilateral SI joints. Degenerative disease with disc height loss at L5-S1. Moderate-severe bilateral facet arthropathy at L4-5 and L5-S1. IMPRESSION: 1. No acute abdominal or pelvic pathology. 2. Cholelithiasis without gallbladder wall thickening or biliary ductal dilatation. 3. Aortic Atherosclerosis (ICD10-I70.0). Electronically Signed   By: Kathreen Devoid M.D.   On: 08/31/2021 12:03    Assessment  66 y.o. female with a history of asthma, right breast cancer, GERD, Graves' disease, hyperlipidemia, hypertension, insulin-dependent diabetes, osteoarthritis, sleep apnea, obesity, presenting this admission with acute onset of abdominal pain, nausea, and vomiting.   Thus far, CT unrevealing other than cholelithiasis. RUQ [place] yesterday with gallstones but no acute cholecystitis. Hepatic steatosis noted.Marland Kitchen LFTs and lipase normal on admission. She has had resolution of abdominal pain, N/V. Empirically on Protonix BID. No prior EGD.   Leukocytosis: WBC count 12 today. Febrile over weekend. Urine culture in process. Blood cultures with no growth thus far.  Normocytic anemia: likely dilutional effect. No overt GI bleeding.   As she has had resolution of abdominal pain, N/V,  recommend outpatient EGD, as she does report prior episode similar to this. No prior EGD. Will need to hold Eliquis prior to procedure as outpatient.    Plan / Recommendations  Continue PPI BID but change from IV to oral Soft diet Miralax daily as needed Outpatient follow-up to discuss EGD     LOS: 1 day    09/02/2021, 8:22 AM  Annitta Needs, PhD,  ANP-BC [facility]

## 2021-09-02 NOTE — Progress Notes (Signed)
Pt has home CPAP unit, water added and patient self administered. Will call if needed

## 2021-09-03 DIAGNOSIS — K2900 Acute gastritis without bleeding: Secondary | ICD-10-CM | POA: Diagnosis not present

## 2021-09-03 LAB — URINE CULTURE: Culture: >=100000 — AB

## 2021-09-03 LAB — BASIC METABOLIC PANEL
Anion gap: 8 (ref 5–15)
BUN: 26 mg/dL — ABNORMAL HIGH (ref 8–23)
CO2: 24 mmol/L (ref 22–32)
Calcium: 7.7 mg/dL — ABNORMAL LOW (ref 8.9–10.3)
Chloride: 105 mmol/L (ref 98–111)
Creatinine, Ser: 1.63 mg/dL — ABNORMAL HIGH (ref 0.44–1.00)
GFR, Estimated: 34 mL/min — ABNORMAL LOW (ref >60)
Glucose, Bld: 171 mg/dL — ABNORMAL HIGH (ref 70–99)
Potassium: 3.5 mmol/L (ref 3.5–5.1)
Sodium: 137 mmol/L (ref 135–145)

## 2021-09-03 LAB — CBC
HCT: 31.7 % — ABNORMAL LOW (ref 36.0–46.0)
Hemoglobin: 10.3 g/dL — ABNORMAL LOW (ref 12.0–15.0)
MCH: 28.2 pg (ref 26.0–34.0)
MCHC: 32.5 g/dL (ref 30.0–36.0)
MCV: 86.8 fL (ref 80.0–100.0)
Platelets: 142 10*3/uL — ABNORMAL LOW (ref 150–400)
RBC: 3.65 MIL/uL — ABNORMAL LOW (ref 3.87–5.11)
RDW: 15.1 % (ref 11.5–15.5)
WBC: 9.9 10*3/uL (ref 4.0–10.5)
nRBC: 0 % (ref 0.0–0.2)

## 2021-09-03 LAB — GLUCOSE, CAPILLARY
Glucose-Capillary: 172 mg/dL — ABNORMAL HIGH (ref 70–99)
Glucose-Capillary: 245 mg/dL — ABNORMAL HIGH (ref 70–99)
Glucose-Capillary: 199 mg/dL — ABNORMAL HIGH (ref 70–99)
Glucose-Capillary: 161 mg/dL — ABNORMAL HIGH (ref 70–99)

## 2021-09-03 LAB — MAGNESIUM: Magnesium: 1.6 mg/dL — ABNORMAL LOW (ref 1.7–2.4)

## 2021-09-03 MED ORDER — SODIUM CHLORIDE 0.9 % IV SOLN
1.0000 g | INTRAVENOUS | Status: DC
  Administered 2021-09-03: 1 g via INTRAVENOUS
  Filled 2021-09-03 (×2): qty 10

## 2021-09-03 MED ORDER — MAGNESIUM SULFATE 2 GM/50ML IV SOLN
2.0000 g | Freq: Once | INTRAVENOUS | Status: AC
  Administered 2021-09-03: 2 g via INTRAVENOUS
  Filled 2021-09-03: qty 50

## 2021-09-03 NOTE — Progress Notes (Signed)
PROGRESS NOTE    [name]  VZD:[id number] DOB: 01/10/1956 DOA: 08/31/2021 PCP: Lacey Spikes, DO   Brief Narrative:  [name] was admitted to the hospital with the working diagnosis of acute gastritis.   66 yo female with the past medical history of hypertension, T2DM, dyslipidemia, CKD and atrial fibrillation who presented with abdominal pain. Patient reported acute onset of abdominal pain that woke her up from her sleep. Her pain was epigastric and associated with nausea and vomiting. On her initial physical examination her blood pressure was 207/96, HR 106, RR 24 and 02 saturation 92%, lungs were clear to auscultation, heart with S1 and S2 present and rhythmic, abdomen tender to palpation with no rebound or guarding, no lower extremity edema.   Na 140, K 3,8 Cl 103, bicarbonate 25 glucose 253 bun 14 cr 0,97 Lipase 45, AST 30 ALT 21  Wbc 8,5 hgb 13,0 plt 202 UA SG 1,010, 100 protein, 0-5 wbc   CT abdomen and pelvis with no acute changes.  Cholelithiasis without gallbladder thickening or biliary duct dilatation.   EKG 101 bpm, normal axis, normal intervals, sinus rhythm with no significant ST segment changes, negative T wave V4 to V6.   Patient was placed on proton pump inhibitors IV, as needed antiemetics and analgesics.  Clear liquid diet and consulted gastroenterology.   [place] right upper quadrant with cholelithiasis but not cholecystitis.     Assessment & Plan:   Principal Problem:   Acute gastritis Active Problems:   Acute kidney injury superimposed on chronic kidney disease (HCC)   Type 2 diabetes mellitus with hyperlipidemia (HCC)   Hypertension   Hypothyroidism   OSA (obstructive sleep apnea)   Malignant neoplasm of upper-outer quadrant of right breast in female, estrogen receptor positive (HCC)   H/O atrial flutter   Class 3 obesity ([place])  Assessment and Plan:  Acute gastritis-improved GERD.   Patient with improvement in abdominal pain but not back to  baseline, no further nausea or vomiting.    Plan to continue with IV proton pump inhibitor, 40 mg pantoprazole q12 hrs, may switch to oral per GI and the soft diet Continue pain control with as needed morphine and nausea control with zofran Continue IV fluids. [place] negative for cholecystitis, plan to further advance diet at tolerated.  Out of bed to chair tid with meals, Pt and Ot.      Acute kidney injury superimposed on chronic kidney disease (HCC) CKD stage 2.   Creatinine improving, continue to hold ACE inhibitor and continue IV fluid   Plan to continue IV fluids with balanced electrolytes solutions at 75 ml per hr Follow up renal function in am, avoid hypotension and nephrotoxic medications.  Advance diet as tolerated.  Good urine output noted  E. coli UTI Start Rocephin 8/22 with noted fevers overnight   Type 2 diabetes mellitus with hyperlipidemia (HCC) Hyperglycemia.    Glucose is 249 today, fasting.  Patient with improved appetite.  Plan to advance diet as tolerated, continue insulin therapy with sliding scale and resume basal insulin with 10 units, at home on 75/25 12 units bid.    Hypertension Continue blood pressure control with carvedilol, and amlodipine and hold benazepril given AKI   Hypothyroidism Continue with levothyroxine.    OSA (obstructive sleep apnea) Continue Cpap.    Malignant neoplasm of upper-outer quadrant of right breast in female, estrogen receptor positive ([place]) Follow up as outpatient.    H/O atrial flutter Continue anticoagulation with apixaban, patient on  carvedilol for rate control.     Class 3 obesity (HCC) Calculated BMI is 54,7   Hypomagnesemia Replete and reevaluate in a.m.     DVT prophylaxis: Eliquis Code Status: Full Family Communication: None at bedside Disposition Plan:  Status is: Inpatient Remains inpatient appropriate because: Need for IV fluid.     Consultants:  GI   Procedures:  None   Antimicrobials:   Anti-infectives (From admission, onward)    Start     Dose/Rate Route Frequency Ordered Stop   09/03/21 0800  cefTRIAXone (ROCEPHIN) 1 g in sodium chloride 0.9 % 100 mL IVPB        1 g 200 mL/hr over 30 Minutes Intravenous Every 24 hours 09/03/21 0647         Subjective: Patient seen and evaluated today and is overall feeling unwell, but maintains a good appetite without any nausea vomiting or epigastric pain.  She was noted to have a mild fever overnight.  Objective: Vitals:   09/02/21 2000 09/03/21 0342 09/03/21 0456 09/03/21 0721  BP: (!) 115/41 (!) 113/54    Pulse: 64 78    Resp: 18 18    Temp: (!) 100.9 F (38.3 C) 99.5 F (37.5 C) 98.6 F (37 C)   TempSrc: Oral Oral Oral   SpO2: 92% 97%  97%  Weight:   (!) 147.8 kg   Height:   5' 4.5" (1.638 m)     Intake/Output Summary (Last 24 hours) at 09/03/2021 1029 Last data filed at 09/03/2021 0700 Gross per 24 hour  Intake 2888.6 ml  Output 1650 ml  Net 1238.6 ml   Filed Weights   09/01/21 0500 09/02/21 0500 09/03/21 0456  Weight: (!) 147 kg (!) 145.8 kg (!) 147.8 kg    Examination:  General exam: Appears calm and comfortable, obese Respiratory system: Clear to auscultation. Respiratory effort normal. Cardiovascular system: S1 & S2 heard, RRR.  Gastrointestinal system: Abdomen is soft Central nervous system: Alert and awake Extremities: No edema Skin: No significant lesions noted Psychiatry: Flat affect.    Data Reviewed: I have personally reviewed following labs and imaging studies  CBC: Recent Labs  Lab 08/31/21 0944 09/01/21 0501 09/02/21 0551 09/03/21 0430  WBC 8.5 11.5* 12.0* 9.9  NEUTROABS 6.6  --  9.2*  --   HGB 13.0 11.7* 11.0* 10.3*  HCT 39.9 36.0 33.6* 31.7*  MCV 86.6 87.6 86.4 86.8  PLT 202 185 168 413*   Basic Metabolic Panel: Recent Labs  Lab 08/31/21 0944 09/01/21 0501 09/02/21 0551 09/03/21 0430  NA 140 136 135 137  K 3.8 3.9 3.5 3.5  CL 103 101 102 105  CO2 '25 27 23 24   '$ GLUCOSE 213* 249* 176* 171*  BUN '14 13 23 '$ 26*  CREATININE 0.97 1.44* 1.78* 1.63*  CALCIUM 9.3 8.3* 7.8* 7.7*  MG  --   --   --  1.6*   GFR: Estimated Creatinine Clearance: 49 mL/min (A) (by C-G formula based on SCr of 1.63 mg/dL (H)). Liver Function Tests: Recent Labs  Lab 08/31/21 0944  AST 30  ALT 21  ALKPHOS 84  BILITOT 0.8  PROT 8.6*  ALBUMIN 4.3   Recent Labs  Lab 08/31/21 0944  LIPASE 45   No results for input(s): "AMMONIA" in the last 168 hours. Coagulation Profile: No results for input(s): "INR", "PROTIME" in the last 168 hours. Cardiac Enzymes: No results for input(s): "CKTOTAL", "CKMB", "CKMBINDEX", "TROPONINI" in the last 168 hours. BNP (last 3 results) No results for input(s): "  PROBNP" in the last 8760 hours. HbA1C: No results for input(s): "HGBA1C" in the last 72 hours. CBG: Recent Labs  Lab 09/02/21 0741 09/02/21 1138 09/02/21 1642 09/02/21 2207 09/03/21 0715  GLUCAP 174* 218* 178* 186* 172*   Lipid Profile: No results for input(s): "CHOL", "HDL", "LDLCALC", "TRIG", "CHOLHDL", "LDLDIRECT" in the last 72 hours. Thyroid Function Tests: No results for input(s): "TSH", "T4TOTAL", "FREET4", "T3FREE", "THYROIDAB" in the last 72 hours. Anemia Panel: No results for input(s): "VITAMINB12", "FOLATE", "FERRITIN", "TIBC", "IRON", "RETICCTPCT" in the last 72 hours. Sepsis Labs: No results for input(s): "PROCALCITON", "LATICACIDVEN" in the last 168 hours.  Recent Results (from the past 240 hour(s))  Urine Culture     Status: Abnormal   Collection Time: 08/31/21 10:36 AM   Specimen: Urine, Clean Catch  Result Value Ref Range Status   Specimen Description   Final    URINE, CLEAN CATCH Performed at [facility], [address], [place], [place] [address]    Special Requests   Final    NONE Performed at [facility], [address] [place], [place] [address]    Culture >=100,000 COLONIES/mL ESCHERICHIA COLI (A)  Final   Report Status 09/03/2021 FINAL   Final   Organism ID, Bacteria ESCHERICHIA COLI (A)  Final      Susceptibility   Escherichia coli - MIC*    AMPICILLIN >=32 RESISTANT Resistant     CEFAZOLIN <=4 SENSITIVE Sensitive     CEFEPIME <=0.12 SENSITIVE Sensitive     CEFTRIAXONE <=0.25 SENSITIVE Sensitive     CIPROFLOXACIN <=0.25 SENSITIVE Sensitive     GENTAMICIN <=1 SENSITIVE Sensitive     IMIPENEM <=0.25 SENSITIVE Sensitive     NITROFURANTOIN <=16 SENSITIVE Sensitive     TRIMETH/SULFA <=20 SENSITIVE Sensitive     AMPICILLIN/SULBACTAM 16 INTERMEDIATE Intermediate     PIP/TAZO <=4 SENSITIVE Sensitive     * >=100,000 COLONIES/mL ESCHERICHIA COLI  Culture, blood (Routine X 2) w Reflex to ID Panel     Status: None (Preliminary result)   Collection Time: 09/01/21 12:01 PM   Specimen: BLOOD LEFT HAND  Result Value Ref Range Status   Specimen Description   Final    BLOOD LEFT HAND BOTTLES DRAWN AEROBIC AND ANAEROBIC   Special Requests   Final    Blood Culture results may not be optimal due to an excessive volume of blood received in culture bottles   Culture   Final    NO GROWTH 2 DAYS Performed at [facility], [address] [place], [place] [address]    Report Status PENDING  Incomplete  Culture, blood (Routine X 2) w Reflex to ID Panel     Status: None (Preliminary result)   Collection Time: 09/01/21 12:14 PM   Specimen: BLOOD LEFT FOREARM  Result Value Ref Range Status   Specimen Description   Final    BLOOD LEFT FOREARM BOTTLES DRAWN AEROBIC AND ANAEROBIC   Special Requests   Final    Blood Culture results may not be optimal due to an excessive volume of blood received in culture bottles   Culture   Final    NO GROWTH 2 DAYS Performed at [facility], [address], [place], [place] [address]    Report Status PENDING  Incomplete         Radiology Studies: US Abdomen Limited RUQ (LIVER/GB)  Result Date: 09/01/2021 CLINICAL DATA:  Epigastric pain EXAM: ULTRASOUND ABDOMEN LIMITED RIGHT UPPER QUADRANT  COMPARISON:  CT AP 08/31/2021 FINDINGS: Gallbladder: Gallstones noted measuring  up to 1.9 cm. No gallbladder wall thickening, pericholecystic fluid or sonographic Murphy's sign. Common bile duct: Diameter: 4.2 mm.  No intrahepatic bile duct dilatation. Liver: No focal lesion identified. Increased parenchymal echogenicity suggesting hepatic steatosis. Portal vein is patent on color Doppler imaging with normal direction of blood flow towards the liver. Other: Exam detail is diminished secondary to patient body habitus. IMPRESSION: 1. Gallstones.  There are no secondary signs of acute cholecystitis. 2. Hepatic steatosis. Electronically Signed   By: Kerby Moors M.D.   On: 09/01/2021 10:47        Scheduled Meds:  amLODipine  10 mg Oral Daily   apixaban  5 mg Oral BID   budesonide (PULMICORT) nebulizer solution  0.25 mg Nebulization BID   carvedilol  12.5 mg Oral BID WC   diclofenac Sodium  2 g Topical QID   insulin aspart  0-20 Units Subcutaneous TID WC   insulin glargine-yfgn  10 Units Subcutaneous Daily   levothyroxine  100 mcg Oral QAC breakfast   pantoprazole  40 mg Oral BID AC   pravastatin  40 mg Oral QHS   pregabalin  100 mg Oral Q8H   Continuous Infusions:  cefTRIAXone (ROCEPHIN)  IV     lactated ringers 75 mL/hr at 09/03/21 0700     LOS: 2 days    Time spent: 35 minutes    Yandiel Bergum Darleen Crocker, DO [facility]  If 7PM-7AM, please contact night-coverage [email or web address] 09/03/2021, 10:29 AM

## 2021-09-03 NOTE — Evaluation (Signed)
Physical Therapy Evaluation Patient Details Name: [name] MRN: [id number] DOB: 04-Nov-1955 Today's Date: 09/03/2021  History of Present Illness  [name] is a 65 y.o. female with medical history significant of hypertension, diabetes, hyperlipidemia, asthma, chronic kidney disease, atrial fibrillation, GERD who presented to the emergency room today after awakening at 4 AM with severe abdominal pain.  Initially she thought it was indigestion because she had frequent burping but she had onset of epigastric abdominal pain and pain that was directly under her breast.  Pain is sharp and constant without radiation into her back or chest.  Round 8 AM she developed nausea and multiple episodes of nonbloody emesis and continues to have emesis while in the emergency department during my examination.  She also complains of abdominal pain as described above.  Has had no diarrhea or constipation.  Her last bowel movement was prior to arrival which did not affect her symptoms.  She has had no dysuria no fevers or chills no chest pain or shortness of breath beyond her baseline which she attributes to her asthma.  Took a Zantac prior to arrival to the emergency department but it did not improve her symptoms.  She reports that yesterday she had a hamburger with extra raw onions and some tomatoes on it and some cheese as well.  Rectus she had sausages and hashbrowns with water and she had a couple of brownies.  Usually does not eat that amount of raw onions but has had it bef   Clinical Impression  Patient demonstrates slow labored movement for sitting up at bedside requiring increased time to move LLE due to pain and stiffness, once seated unable flex left knee beyond 20-30 degrees due to tight quads, required much time to complete sit to stands and transfer to chair with slow labored movement and poor tolerance for weight bearing on LLE.  Patient unable to fully extend trunk due to weakness and tolerated  sitting up in chair after therapy - RN notified.  Patient will benefit from continued skilled physical therapy in hospital and recommended venue below to increase strength, balance, endurance for safe ADLs and gait.         Recommendations for follow up therapy are one component of a multi-disciplinary discharge planning process, led by the attending physician.  Recommendations may be updated based on patient status, additional functional criteria and insurance authorization.  Follow Up Recommendations Home health PT      Assistance Recommended at Discharge Set up Supervision/Assistance  Patient can return home with the following  A lot of help with walking and/or transfers;A lot of help with bathing/dressing/bathroom;Assistance with cooking/housework;Help with stairs or ramp for entrance    Equipment Recommendations None recommended by PT  Recommendations for Other Services       Functional Status Assessment Patient has had a recent decline in their functional status and demonstrates the ability to make significant improvements in function in a reasonable and predictable amount of time.     Precautions / Restrictions Precautions Precautions: Fall Restrictions Weight Bearing Restrictions: No      Mobility  Bed Mobility Overal bed mobility: Needs Assistance Bed Mobility: Supine to Sit     Supine to sit: Min assist, HOB elevated     General bed mobility comments: increased time with diffiuclty moving LLE due to increasing knee pain    Transfers Overall transfer level: Needs assistance Equipment used: Rolling walker (2 wheels) Transfers: Sit to/from Stand, Bed to chair/wheelchair/BSC Sit to Stand: Min  guard   Step pivot transfers: Min guard, Min assist       General transfer comment: slow labored movement with diffiuclty extending trunk due to weakness    Ambulation/Gait Ambulation/Gait assistance: Min assist Gait Distance (Feet): 6 Feet Assistive device: Rolling  walker (2 wheels) Gait Pattern/deviations: Decreased step length - right, Decreased step length - left, Decreased stride length, Trunk flexed Gait velocity: decreased     General Gait Details: limited to a few slow labored side steps at bedside with flexed trunk and limited mostly due to left knee pain and generalized weakness  Stairs            Wheelchair Mobility    Modified Rankin (Stroke Patients Only)       Balance                                             Pertinent Vitals/Pain Pain Assessment Pain Assessment: Faces Faces Pain Scale: Hurts even more Pain Location: left knee Pain Descriptors / Indicators: Grimacing, Guarding, Sore Pain Intervention(s): Limited activity within patient's tolerance, Monitored during session, Repositioned    Home Living Family/patient expects to be discharged to:: Private residence Living Arrangements: Children Available Help at Discharge: Family;Available 24 hours/day Type of Home: House Home Access: Ramped entrance       Home Layout: One level Home Equipment: Air cabin crew (4 wheels);Grab bars - tub/shower      Prior Function Prior Level of Function : Needs assist       Physical Assist : Mobility (physical);ADLs (physical) Mobility (physical): Bed mobility;Transfers;Gait;Stairs   Mobility Comments: household ambulation using Rollator ADLs Comments: assisted by family     Hand Dominance        Extremity/Trunk Assessment   Upper Extremity Assessment Upper Extremity Assessment: Defer to OT evaluation    Lower Extremity Assessment Lower Extremity Assessment: Overall WFL for tasks assessed;LLE deficits/detail LLE Deficits / Details: grossly 3+/5 LLE: Unable to fully assess due to pain LLE Sensation: WNL LLE Coordination: WNL    Cervical / Trunk Assessment Cervical / Trunk Assessment: Kyphotic  Communication   Communication: No difficulties  Cognition Arousal/Alertness:  Awake/alert Behavior During Therapy: WFL for tasks assessed/performed Overall Cognitive Status: Within Functional Limits for tasks assessed                                          General Comments      Exercises     Assessment/Plan    PT Assessment Patient needs continued PT services  PT Problem List Decreased strength;Decreased activity tolerance;Decreased balance;Decreased mobility       PT Treatment Interventions DME instruction;Gait training;Functional mobility training;Therapeutic activities;Therapeutic exercise;Patient/family education;Balance training    PT Goals (Current goals can be found in the Care Plan section)  Acute Rehab PT Goals Patient Stated Goal: return home with family to assist PT Goal Formulation: With patient Time For Goal Achievement: 09/10/21 Potential to Achieve Goals: Good    Frequency Min 3X/week     Co-evaluation               AM-PAC PT "6 Clicks" Mobility  Outcome Measure Help needed turning from your back to your side while in a flat bed without using bedrails?: A Little Help needed moving from lying on your back  to sitting on the side of a flat bed without using bedrails?: A Little Help needed moving to and from a bed to a chair (including a wheelchair)?: A Little Help needed standing up from a chair using your arms (e.g., wheelchair or bedside chair)?: A Little Help needed to walk in hospital room?: A Lot Help needed climbing 3-5 steps with a railing? : Total 6 Click Score: 15    End of Session   Activity Tolerance: Patient tolerated treatment well;Patient limited by fatigue Patient left: in chair;with call bell/phone within reach Nurse Communication: Mobility status PT Visit Diagnosis: Unsteadiness on feet (R26.81);Other abnormalities of gait and mobility (R26.89);Muscle weakness (generalized) (M62.81)    Time: 6759-1638 PT Time Calculation (min) (ACUTE ONLY): 28 min   Charges:   PT Evaluation $PT Eval  Moderate Complexity: 1 Mod PT Treatments $Therapeutic Activity: 23-37 mins        12:37 PM, 09/03/21 Lonell Grandchild, MPT Physical Therapist with [facility] [phone] office [phone] mobile phone

## 2021-09-03 NOTE — TOC Progression Note (Signed)
Transition of Care [facility]) - Progression Note    Patient Details  Name: [name] MRN: [id number] Date of Birth: [birth year]  Transition of Care [facility]) CM/SW [place], [place] Phone Number: 09/03/2021, 12:55 PM  Clinical Narrative:    CSW updated that PT is recommending [facility] PT for pt. CSW met with pt and daughter in room to speak about recommendation. Pt states at this time she would not like [facility] set up. Pts daughters live with her. They are able to assist pt as needed. Pt has a lift chair and rollator in the home. TOC to follow.     Barriers to Discharge: Continued Medical Work up  Expected Discharge Plan and Services                                                 Social Determinants of Health (SDOH) Interventions    Readmission Risk Interventions     No data to display

## 2021-09-03 NOTE — Plan of Care (Signed)
  Problem: Acute Rehab PT Goals(only PT should resolve) Goal: Pt Will Go Supine/Side To Sit Outcome: Progressing Flowsheets (Taken 09/03/2021 1238) Pt will go Supine/Side to Sit:  with supervision  with modified independence Goal: Patient Will Transfer Sit To/From Stand Outcome: Progressing Flowsheets (Taken 09/03/2021 1238) Patient will transfer sit to/from stand:  with supervision  with modified independence Goal: Pt Will Transfer Bed To Chair/Chair To Bed Outcome: Progressing Flowsheets (Taken 09/03/2021 1238) Pt will Transfer Bed to Chair/Chair to Bed:  with supervision  with modified independence Goal: Pt Will Ambulate Outcome: Progressing Flowsheets (Taken 09/03/2021 1238) Pt will Ambulate:  25 feet  with min guard assist  with minimal assist  with rolling walker   12:39 PM, 09/03/21 Lonell Grandchild, MPT Physical Therapist with [facility] [phone] office [phone] mobile phone

## 2021-09-04 DIAGNOSIS — K2900 Acute gastritis without bleeding: Secondary | ICD-10-CM | POA: Diagnosis not present

## 2021-09-04 LAB — GLUCOSE, CAPILLARY
Glucose-Capillary: 173 mg/dL — ABNORMAL HIGH (ref 70–99)
Glucose-Capillary: 205 mg/dL — ABNORMAL HIGH (ref 70–99)
Glucose-Capillary: 219 mg/dL — ABNORMAL HIGH (ref 70–99)
Glucose-Capillary: 270 mg/dL — ABNORMAL HIGH (ref 70–99)

## 2021-09-04 LAB — BASIC METABOLIC PANEL
Anion gap: 7 (ref 5–15)
BUN: 21 mg/dL (ref 8–23)
CO2: 25 mmol/L (ref 22–32)
Calcium: 7.9 mg/dL — ABNORMAL LOW (ref 8.9–10.3)
Chloride: 105 mmol/L (ref 98–111)
Creatinine, Ser: 1.34 mg/dL — ABNORMAL HIGH (ref 0.44–1.00)
GFR, Estimated: 43 mL/min — ABNORMAL LOW (ref >60)
Glucose, Bld: 155 mg/dL — ABNORMAL HIGH (ref 70–99)
Potassium: 3.6 mmol/L (ref 3.5–5.1)
Sodium: 137 mmol/L (ref 135–145)

## 2021-09-04 LAB — CBC
HCT: 31.2 % — ABNORMAL LOW (ref 36.0–46.0)
Hemoglobin: 10.4 g/dL — ABNORMAL LOW (ref 12.0–15.0)
MCH: 28.6 pg (ref 26.0–34.0)
MCHC: 33.3 g/dL (ref 30.0–36.0)
MCV: 85.7 fL (ref 80.0–100.0)
Platelets: 164 10*3/uL (ref 150–400)
RBC: 3.64 MIL/uL — ABNORMAL LOW (ref 3.87–5.11)
RDW: 14.9 % (ref 11.5–15.5)
WBC: 8.4 10*3/uL (ref 4.0–10.5)
nRBC: 0 % (ref 0.0–0.2)

## 2021-09-04 LAB — MAGNESIUM: Magnesium: 2.1 mg/dL (ref 1.7–2.4)

## 2021-09-04 MED ORDER — PANTOPRAZOLE SODIUM 40 MG PO TBEC: 40.0000 mg | DELAYED_RELEASE_TABLET | Freq: Two times a day (BID) | ORAL | 0 refills | Status: DC

## 2021-09-04 MED ORDER — FOSFOMYCIN TROMETHAMINE 3 G PO PACK
3.0000 g | PACK | Freq: Once | ORAL | Status: AC
  Administered 2021-09-04: 3 g via ORAL
  Filled 2021-09-04: qty 3

## 2021-09-04 MED ORDER — ALBUTEROL SULFATE HFA 108 (90 BASE) MCG/ACT IN AERS
INHALATION_SPRAY | RESPIRATORY_TRACT | Status: AC
  Filled 2021-09-04: qty 6.7

## 2021-09-04 MED ORDER — ALBUTEROL SULFATE HFA 108 (90 BASE) MCG/ACT IN AERS: 2.0000 | INHALATION_SPRAY | Freq: Four times a day (QID) | RESPIRATORY_TRACT | Status: DC | PRN

## 2021-09-04 MED ORDER — POLYETHYLENE GLYCOL 3350 17 G PO PACK: 17.0000 g | PACK | Freq: Every day | ORAL | 0 refills | Status: AC | PRN

## 2021-09-04 NOTE — Discharge Summary (Signed)
Physician Discharge Summary  [name] NUU:[id number] DOB: 09/05/1955 DOA: 08/31/2021  PCP: Coral Spikes, DO  Admit date: 08/31/2021  Discharge date: 09/04/2021  Admitted From:Home  Disposition:  Home  Recommendations for Outpatient Follow-up:  Follow up with PCP in 1-2 weeks Given dose of fosfomycin for UTI on 8/23, no further need for antibiotics at home Continue PPI twice daily Continue other home medications as prior Follow-up with GI outpatient to discuss EGD  Home Health: Refuses  Equipment/Devices: None  Discharge Condition:Stable  CODE STATUS: Full  Diet recommendation: Heart Healthy/carb modified  Brief/Interim Summary: 66 yo female with the past medical history of hypertension, T2DM, dyslipidemia, CKD and atrial fibrillation who presented with abdominal pain.  She was admitted with acute gastritis and was seen by GI.  She had imaging demonstrating findings of cholelithiasis, but no cholecystitis.  She was started on Protonix empirically and gradually had improvement in her condition and is now tolerating diet without any further symptomatology.  She did have some mild febrile episodes and urine culture has demonstrated E. coli UTI for which she was started on IV Rocephin and will be given fosfomycin on the day of discharge.  No growth on blood cultures noted.  She is eager to go home at this point in time but continues to have some very mild fevers up to 100.7 Fahrenheit overnight.  She claims that she is doing well and wants to go home.  No other acute events noted.  Discharge Diagnoses:  Principal Problem:   Acute gastritis Active Problems:   Acute kidney injury superimposed on chronic kidney disease (HCC)   Type 2 diabetes mellitus with hyperlipidemia (HCC)   Hypertension   Hypothyroidism   OSA (obstructive sleep apnea)   Malignant neoplasm of upper-outer quadrant of right breast in female, estrogen receptor positive ([place])   H/O atrial flutter   Class 3  obesity ([place])  Principal discharge diagnosis: Acute gastritis.  E. coli UTI.  Discharge Instructions  Discharge Instructions     Diet - low sodium heart healthy   Complete by: As directed    Increase activity slowly   Complete by: As directed       Allergies as of 09/04/2021       Reactions   Procardia [nifedipine] Other (See Comments)   MIGRAINES   Aspirin    Unable to take due to blood pressure medication   Diltiazem Itching        Medication List     TAKE these medications    albuterol 108 (90 Base) MCG/ACT inhaler Commonly known as: Ventolin HFA INHALE 2 PUFFS INTO THE LUNGS EVERY 6 HOURS AS NEEDED FOR SHORTNESS OF BREATH OR WHEEZING. What changed:  how much to take how to take this when to take this reasons to take this additional instructions   albuterol (2.5 MG/3ML) 0.083% nebulizer solution Commonly known as: PROVENTIL USE 1 VIAL IN NEBULIZER EVERY 6 HOURS AS NEEDED FOR WHEEZING OR SHORTNESS OF BREATH. What changed: See the new instructions.   amLODipine 10 MG tablet Commonly known as: NORVASC TAKE (1) TABLET BY MOUTH ONCE DAILY. What changed: See the new instructions.   benazepril 40 MG tablet Commonly known as: LOTENSIN TAKE (1) TABLET BY MOUTH ONCE DAILY. What changed: See the new instructions.   carvedilol 12.5 MG tablet Commonly known as: COREG TAKE (1) TABLET BY MOUTH TWICE DAILY. What changed: See the new instructions.   Depend Underwear X-Large Misc Use daily as needed.   Eliquis 5 MG  Tabs tablet Generic drug: apixaban TAKE (1) TABLET BY MOUTH TWICE DAILY. What changed: See the new instructions.   fluticasone 44 MCG/ACT inhaler Commonly known as: Flovent HFA Inhale 2 puffs into the lungs 2 (two) times daily.   HumaLOG Mix 75/25 KwikPen (75-25) 100 UNIT/ML Kwikpen Generic drug: Insulin Lispro Prot & Lispro Inject 12 Units into the skin in the morning and at bedtime.   Insulin Pen Needle 31G X 8 MM Misc Use to inject insulin  one time daily   levothyroxine 100 MCG tablet Commonly known as: SYNTHROID Take 100 mcg by mouth daily before breakfast.   OneTouch Ultra test strip Generic drug: glucose blood USE TO TEST BLOOD SUGAR 4 TIMES DAILY AS DIRECTED.   pravastatin 40 MG tablet Commonly known as: PRAVACHOL TAKE (1) TABLET BY MOUTH AT BEDTIME. What changed: See the new instructions.   pregabalin 100 MG capsule Commonly known as: LYRICA Take 100 mg by mouth 3 (three) times daily.   traMADol 50 MG tablet Commonly known as: ULTRAM TAKE 1 TABLET EVERY 8 HOURS AS NEEDED FOR MODERATE TO SEVERE PAIN. What changed:  how much to take how to take this when to take this reasons to take this additional instructions   Trulicity 1.24 PY/0.9XI Sopn Generic drug: Dulaglutide Inject 0.75 mg into the skin once a week.        Follow-up Information     Coral Spikes, DO. Schedule an appointment as soon as possible for a visit in 1 week(s).   Specialty: Family Medicine Contact information: [place] [address] [phone]                Allergies  Allergen Reactions   Procardia [Nifedipine] Other (See Comments)    MIGRAINES   Aspirin     Unable to take due to blood pressure medication   Diltiazem Itching    Consultations: GI   Procedures/Studies: US Abdomen Limited RUQ (LIVER/GB)  Result Date: 09/01/2021 CLINICAL DATA:  Epigastric pain EXAM: ULTRASOUND ABDOMEN LIMITED RIGHT UPPER QUADRANT COMPARISON:  CT AP 08/31/2021 FINDINGS: Gallbladder: Gallstones noted measuring up to 1.9 cm. No gallbladder wall thickening, pericholecystic fluid or sonographic Murphy's sign. Common bile duct: Diameter: 4.2 mm.  No intrahepatic bile duct dilatation. Liver: No focal lesion identified. Increased parenchymal echogenicity suggesting hepatic steatosis. Portal vein is patent on color Doppler imaging with normal direction of blood flow towards the liver. Other: Exam detail is diminished  secondary to patient body habitus. IMPRESSION: 1. Gallstones.  There are no secondary signs of acute cholecystitis. 2. Hepatic steatosis. Electronically Signed   By: Kerby Moors M.D.   On: 09/01/2021 10:47   CT ABDOMEN PELVIS W CONTRAST  Result Date: 08/31/2021 CLINICAL DATA:  Epigastric pain. EXAM: CT ABDOMEN AND PELVIS WITH CONTRAST TECHNIQUE: Multidetector CT imaging of the abdomen and pelvis was performed using the standard protocol following bolus administration of intravenous contrast. RADIATION DOSE REDUCTION: This exam was performed according to the departmental dose-optimization program which includes automated exposure control, adjustment of the mA and/or kV according to patient size and/or use of iterative reconstruction technique. CONTRAST:  148m OMNIPAQUE IOHEXOL 300 MG/ML  SOLN COMPARISON:  02/25/2013 FINDINGS: Lower chest: No acute abnormality. Hepatobiliary: No focal liver abnormality is seen. Cholelithiasis without gallbladder wall thickening. No biliary ductal dilatation. Pancreas: Unremarkable. No pancreatic ductal dilatation or surrounding inflammatory changes. Spleen: Normal in size without focal abnormality. Adrenals/Urinary Tract: Adrenal glands are unremarkable. Kidneys are normal, without renal calculi, focal lesion, or  hydronephrosis. Bladder is unremarkable. Stomach/Bowel: Stomach is within normal limits. No evidence of bowel wall thickening, distention, or inflammatory changes. Appendix is normal. Vascular/Lymphatic: Normal caliber abdominal aorta with mild atherosclerosis. No lymphadenopathy. Reproductive: Small calcified left uterine fibroid. Otherwise uterus and bilateral adnexa are unremarkable. Other: No abdominal wall hernia or abnormality. No abdominopelvic ascites. Musculoskeletal: No acute osseous abnormality. No aggressive osseous lesion. Mild osteoarthritis of bilateral SI joints. Degenerative disease with disc height loss at L5-S1. Moderate-severe bilateral facet  arthropathy at L4-5 and L5-S1. IMPRESSION: 1. No acute abdominal or pelvic pathology. 2. Cholelithiasis without gallbladder wall thickening or biliary ductal dilatation. 3. Aortic Atherosclerosis (ICD10-I70.0). Electronically Signed   By: Kathreen Devoid M.D.   On: 08/31/2021 12:03     Discharge Exam: Vitals:   09/03/21 2148 09/04/21 0530  BP: (!) 116/48 130/66  Pulse: 78 85  Resp: 20 18  Temp: 98.9 F (37.2 C) 99.9 F (37.7 C)  SpO2: 93% 98%   Vitals:   09/03/21 1952 09/03/21 2148 09/04/21 0500 09/04/21 0530  BP:  (!) 116/48  130/66  Pulse:  78  85  Resp:  20  18  Temp:  98.9 F (37.2 C)  99.9 F (37.7 C)  TempSrc:  Oral  Oral  SpO2: 96% 93%  98%  Weight:   (!) 150.8 kg   Height:        General: Pt is alert, awake, not in acute distress Cardiovascular: RRR, S1/S2 +, no rubs, no gallops Respiratory: CTA bilaterally, no wheezing, no rhonchi Abdominal: Soft, NT, ND, bowel sounds + Extremities: no edema, no cyanosis    The results of significant diagnostics from this hospitalization (including imaging, microbiology, ancillary and laboratory) are listed below for reference.     Microbiology: Recent Results (from the past 240 hour(s))  Urine Culture     Status: Abnormal   Collection Time: 08/31/21 10:36 AM   Specimen: Urine, Clean Catch  Result Value Ref Range Status   Specimen Description   Final    URINE, CLEAN CATCH Performed at [facility], [address], [place], [place] [address]    Special Requests   Final    NONE Performed at [facility], [address] [place], [place] [address]    Culture >=100,000 COLONIES/mL ESCHERICHIA COLI (A)  Final   Report Status 09/03/2021 FINAL  Final   Organism ID, Bacteria ESCHERICHIA COLI (A)  Final      Susceptibility   Escherichia coli - MIC*    AMPICILLIN >=32 RESISTANT Resistant     CEFAZOLIN <=4 SENSITIVE Sensitive     CEFEPIME <=0.12 SENSITIVE Sensitive     CEFTRIAXONE <=0.25 SENSITIVE Sensitive     CIPROFLOXACIN  <=0.25 SENSITIVE Sensitive     GENTAMICIN <=1 SENSITIVE Sensitive     IMIPENEM <=0.25 SENSITIVE Sensitive     NITROFURANTOIN <=16 SENSITIVE Sensitive     TRIMETH/SULFA <=20 SENSITIVE Sensitive     AMPICILLIN/SULBACTAM 16 INTERMEDIATE Intermediate     PIP/TAZO <=4 SENSITIVE Sensitive     * >=100,000 COLONIES/mL ESCHERICHIA COLI  Culture, blood (Routine X 2) w Reflex to ID Panel     Status: None (Preliminary result)   Collection Time: 09/01/21 12:01 PM   Specimen: BLOOD LEFT HAND  Result Value Ref Range Status   Specimen Description   Final    BLOOD LEFT HAND BOTTLES DRAWN AEROBIC AND ANAEROBIC   Special Requests   Final    Blood Culture results may not be optimal due to an excessive volume of blood  received in culture bottles   Culture   Final    NO GROWTH 3 DAYS Performed at [facility], [address] [place], [place] [address]    Report Status PENDING  Incomplete  Culture, blood (Routine X 2) w Reflex to ID Panel     Status: None (Preliminary result)   Collection Time: 09/01/21 12:14 PM   Specimen: BLOOD LEFT FOREARM  Result Value Ref Range Status   Specimen Description   Final    BLOOD LEFT FOREARM BOTTLES DRAWN AEROBIC AND ANAEROBIC   Special Requests   Final    Blood Culture results may not be optimal due to an excessive volume of blood received in culture bottles   Culture   Final    NO GROWTH 3 DAYS Performed at [facility], [address] [place], [place] [address]    Report Status PENDING  Incomplete     Labs: BNP (last 3 results) No results for input(s): "BNP" in the last 8760 hours. Basic Metabolic Panel: Recent Labs  Lab 08/31/21 0944 09/01/21 0501 09/02/21 0551 09/03/21 0430 09/04/21 0531  NA 140 136 135 137 137  K 3.8 3.9 3.5 3.5 3.6  CL 103 101 102 105 105  CO2 '25 27 23 24 25  '$ GLUCOSE 213* 249* 176* 171* 155*  BUN '14 13 23 '$ 26* 21  CREATININE 0.97 1.44* 1.78* 1.63* 1.34*  CALCIUM 9.3 8.3* 7.8* 7.7* 7.9*  MG  --   --   --  1.6* 2.1   Liver  Function Tests: Recent Labs  Lab 08/31/21 0944  AST 30  ALT 21  ALKPHOS 84  BILITOT 0.8  PROT 8.6*  ALBUMIN 4.3   Recent Labs  Lab 08/31/21 0944  LIPASE 45   No results for input(s): "AMMONIA" in the last 168 hours. CBC: Recent Labs  Lab 08/31/21 0944 09/01/21 0501 09/02/21 0551 09/03/21 0430 09/04/21 0531  WBC 8.5 11.5* 12.0* 9.9 8.4  NEUTROABS 6.6  --  9.2*  --   --   HGB 13.0 11.7* 11.0* 10.3* 10.4*  HCT 39.9 36.0 33.6* 31.7* 31.2*  MCV 86.6 87.6 86.4 86.8 85.7  PLT 202 185 168 142* 164   Cardiac Enzymes: No results for input(s): "CKTOTAL", "CKMB", "CKMBINDEX", "TROPONINI" in the last 168 hours. BNP: Invalid input(s): "POCBNP" CBG: Recent Labs  Lab 09/03/21 0715 09/03/21 1105 09/03/21 1639 09/03/21 2137 09/04/21 0741  GLUCAP 172* 245* 199* 161* 173*   D-Dimer No results for input(s): "DDIMER" in the last 72 hours. Hgb A1c No results for input(s): "HGBA1C" in the last 72 hours. Lipid Profile No results for input(s): "CHOL", "HDL", "LDLCALC", "TRIG", "CHOLHDL", "LDLDIRECT" in the last 72 hours. Thyroid function studies No results for input(s): "TSH", "T4TOTAL", "T3FREE", "THYROIDAB" in the last 72 hours.  Invalid input(s): "FREET3" Anemia work up No results for input(s): "VITAMINB12", "FOLATE", "FERRITIN", "TIBC", "IRON", "RETICCTPCT" in the last 72 hours. Urinalysis    Component Value Date/Time   COLORURINE STRAW (A) 08/31/2021 1036   APPEARANCEUR CLEAR 08/31/2021 1036   LABSPEC 1.010 08/31/2021 1036   PHURINE 6.0 08/31/2021 1036   GLUCOSEU NEGATIVE 08/31/2021 1036   HGBUR SMALL (A) 08/31/2021 1036   BILIRUBINUR NEGATIVE 08/31/2021 1036   KETONESUR 5 (A) 08/31/2021 1036   PROTEINUR 100 (A) 08/31/2021 1036   UROBILINOGEN 0.2 09/06/2013 1040   NITRITE NEGATIVE 08/31/2021 1036   LEUKOCYTESUR NEGATIVE 08/31/2021 1036   Sepsis Labs Recent Labs  Lab 09/01/21 0501 09/02/21 0551 09/03/21 0430 09/04/21 0531  WBC 11.5* 12.0* 9.9  8.4    Microbiology Recent Results (from the past 240 hour(s))  Urine Culture     Status: Abnormal   Collection Time: 08/31/21 10:36 AM   Specimen: Urine, Clean Catch  Result Value Ref Range Status   Specimen Description   Final    URINE, CLEAN CATCH Performed at [facility], [address], [place], [place] [address]    Special Requests   Final    NONE Performed at [facility], [address] [place], [place] [address]    Culture >=100,000 COLONIES/mL ESCHERICHIA COLI (A)  Final   Report Status 09/03/2021 FINAL  Final   Organism ID, Bacteria ESCHERICHIA COLI (A)  Final      Susceptibility   Escherichia coli - MIC*    AMPICILLIN >=32 RESISTANT Resistant     CEFAZOLIN <=4 SENSITIVE Sensitive     CEFEPIME <=0.12 SENSITIVE Sensitive     CEFTRIAXONE <=0.25 SENSITIVE Sensitive     CIPROFLOXACIN <=0.25 SENSITIVE Sensitive     GENTAMICIN <=1 SENSITIVE Sensitive     IMIPENEM <=0.25 SENSITIVE Sensitive     NITROFURANTOIN <=16 SENSITIVE Sensitive     TRIMETH/SULFA <=20 SENSITIVE Sensitive     AMPICILLIN/SULBACTAM 16 INTERMEDIATE Intermediate     PIP/TAZO <=4 SENSITIVE Sensitive     * >=100,000 COLONIES/mL ESCHERICHIA COLI  Culture, blood (Routine X 2) w Reflex to ID Panel     Status: None (Preliminary result)   Collection Time: 09/01/21 12:01 PM   Specimen: BLOOD LEFT HAND  Result Value Ref Range Status   Specimen Description   Final    BLOOD LEFT HAND BOTTLES DRAWN AEROBIC AND ANAEROBIC   Special Requests   Final    Blood Culture results may not be optimal due to an excessive volume of blood received in culture bottles   Culture   Final    NO GROWTH 3 DAYS Performed at [facility], [address] [place], [place] [address]    Report Status PENDING  Incomplete  Culture, blood (Routine X 2) w Reflex to ID Panel     Status: None (Preliminary result)   Collection Time: 09/01/21 12:14 PM   Specimen: BLOOD LEFT FOREARM  Result Value Ref Range Status   Specimen Description   Final     BLOOD LEFT FOREARM BOTTLES DRAWN AEROBIC AND ANAEROBIC   Special Requests   Final    Blood Culture results may not be optimal due to an excessive volume of blood received in culture bottles   Culture   Final    NO GROWTH 3 DAYS Performed at [facility], [address], [place], [place] [address]    Report Status PENDING  Incomplete     Time coordinating discharge: 35 minutes  SIGNED:   Rodena Goldmann, DO [facility] 09/04/2021, 9:17 AM  If 7PM-7AM, please contact night-coverage [email or web address]

## 2021-09-04 NOTE — Care Management Important Message (Signed)
Important Message  Patient Details  Name: [name] MRN: [id number] Date of Birth: [birth year]   Medicare Important Message Given:  Yes     Tommy Medal 09/04/2021, 12:16 PM

## 2021-09-04 NOTE — Progress Notes (Addendum)
Attempted to discharge patient home. Able to transfer from bed to wheelchair. Once outside at car pt. Was unable to get into car due to severe right knee pain and unable to really move like she needed to. Pt. And family were agreeable to come back to room and seek other options for discharge. Pt. Is now back in room in bed.

## 2021-09-05 DIAGNOSIS — K2900 Acute gastritis without bleeding: Secondary | ICD-10-CM | POA: Diagnosis not present

## 2021-09-05 LAB — BASIC METABOLIC PANEL
Anion gap: 10 (ref 5–15)
BUN: 15 mg/dL (ref 8–23)
CO2: 24 mmol/L (ref 22–32)
Calcium: 7.8 mg/dL — ABNORMAL LOW (ref 8.9–10.3)
Chloride: 105 mmol/L (ref 98–111)
Creatinine, Ser: 1.33 mg/dL — ABNORMAL HIGH (ref 0.44–1.00)
GFR, Estimated: 44 mL/min — ABNORMAL LOW (ref >60)
Glucose, Bld: 180 mg/dL — ABNORMAL HIGH (ref 70–99)
Potassium: 3.6 mmol/L (ref 3.5–5.1)
Sodium: 139 mmol/L (ref 135–145)

## 2021-09-05 LAB — CBC
HCT: 32.2 % — ABNORMAL LOW (ref 36.0–46.0)
Hemoglobin: 10.5 g/dL — ABNORMAL LOW (ref 12.0–15.0)
MCH: 28.5 pg (ref 26.0–34.0)
MCHC: 32.6 g/dL (ref 30.0–36.0)
MCV: 87.3 fL (ref 80.0–100.0)
Platelets: 168 10*3/uL (ref 150–400)
RBC: 3.69 MIL/uL — ABNORMAL LOW (ref 3.87–5.11)
RDW: 15 % (ref 11.5–15.5)
WBC: 8.5 10*3/uL (ref 4.0–10.5)
nRBC: 0 % (ref 0.0–0.2)

## 2021-09-05 LAB — PROCALCITONIN: Procalcitonin: 0.23 ng/mL

## 2021-09-05 LAB — GLUCOSE, CAPILLARY
Glucose-Capillary: 190 mg/dL — ABNORMAL HIGH (ref 70–99)
Glucose-Capillary: 210 mg/dL — ABNORMAL HIGH (ref 70–99)
Glucose-Capillary: 171 mg/dL — ABNORMAL HIGH (ref 70–99)
Glucose-Capillary: 130 mg/dL — ABNORMAL HIGH (ref 70–99)

## 2021-09-05 MED ORDER — DICLOFENAC SODIUM 1 % EX GEL
4.0000 g | Freq: Four times a day (QID) | CUTANEOUS | Status: DC
  Administered 2021-09-05 – 2021-09-09 (×13): 4 g via TOPICAL
  Filled 2021-09-05 (×2): qty 100

## 2021-09-05 NOTE — Plan of Care (Signed)
  Problem: Acute Rehab OT Goals (only OT should resolve) Goal: Pt. Will Perform Grooming Flowsheets (Taken 09/05/2021 1132) Pt Will Perform Grooming:  with supervision  standing Goal: Pt. Will Perform Upper Body Bathing Flowsheets (Taken 09/05/2021 1132) Pt Will Perform Upper Body Bathing:  with modified independence  sitting Goal: Pt. Will Perform Lower Body Bathing Flowsheets (Taken 09/05/2021 1132) Pt Will Perform Lower Body Bathing:  with modified independence  with adaptive equipment  sitting/lateral leans Goal: Pt. Will Perform Lower Body Dressing Flowsheets (Taken 09/05/2021 1132) Pt Will Perform Lower Body Dressing:  with modified independence  sitting/lateral leans  with adaptive equipment Goal: Pt. Will Transfer To Toilet Flowsheets (Taken 09/05/2021 1132) Pt Will Transfer to Toilet:  with min guard assist  stand pivot transfer Goal: Pt/Caregiver Will Perform Home Exercise Program Flowsheets (Taken 09/05/2021 1132) Pt/caregiver will Perform Home Exercise Program:  Increased strength  Both right and left upper extremity  Independently  Wiley Magan OT, MOT

## 2021-09-05 NOTE — TOC Initial Note (Signed)
Transition of Care [facility]) - Initial/Assessment Note    Patient Details  Name: [name] MRN: [id number] Date of Birth: 03-Jul-1955  Transition of [facility]) CM/SW Contact:    Shade Flood, LCSW Phone Number: 09/05/2021, 12:19 PM  Clinical Narrative:                  Pt admitted from home. Initially PT recommendation was for [facility] at dc though recommendation has now changed to SNF. Spoke with pt to assess and review dc planning. Pt agreeable to SNF rehab. CMS provider options reviewed. Will refer to [facility] snfs at pt request. TOC will start insurance auth. Anticipating dc tomorrow.  Expected Discharge Plan: Skilled Nursing Facility Barriers to Discharge: Continued Medical Work up   Patient Goals and CMS Choice Patient states their goals for this hospitalization and ongoing recovery are:: get better CMS Medicare.gov Compare Post Acute Care list provided to:: Patient Choice offered to / list presented to : Patient  Expected Discharge Plan and Services Expected Discharge Plan: [place] Choice: [place] Living arrangements for the past 2 months: Single Family Home Expected Discharge Date: 09/04/21                                    Prior Living Arrangements/Services Living arrangements for the past 2 months: Single Family Home Lives with:: Self Patient language and need for interpreter reviewed:: Yes        Need for Family Participation in Patient Care: No (Comment)     Criminal Activity/Legal Involvement Pertinent to Current Situation/Hospitalization: No - Comment as needed  Activities of Daily Living Home Assistive Devices/Equipment: Environmental consultant (specify type) ADL Screening (condition at time of admission) Patient's cognitive ability adequate to safely complete daily activities?: Yes Is the patient deaf or have difficulty hearing?: No Does the patient have difficulty seeing, even when wearing  glasses/contacts?: No Does the patient have difficulty concentrating, remembering, or making decisions?: No Patient able to express need for assistance with ADLs?: Yes Does the patient have difficulty dressing or bathing?: No Independently performs ADLs?: No Communication: Independent Dressing (OT): Needs assistance Is this a change from baseline?: Change from baseline, expected to last <3days Grooming: Independent Feeding: Independent Bathing: Needs assistance Is this a change from baseline?: Change from baseline, expected to last <3 days Toileting: Needs assistance Is this a change from baseline?: Change from baseline, expected to last <3 days In/Out Bed: Needs assistance Is this a change from baseline?: Change from baseline, expected to last <3 days Walks in Home: Independent with device (comment) Does the patient have difficulty walking or climbing stairs?: No Weakness of Legs: Both Weakness of Arms/Hands: Both  Permission Sought/Granted Permission sought to share information with : Chartered certified accountant granted to share information with : Yes, Verbal Permission Granted     Permission granted to share info w AGENCY: snfs        Emotional Assessment   Attitude/Demeanor/Rapport: Engaged Affect (typically observed): Pleasant Orientation: : Oriented to Self, Oriented to Place, Oriented to  Time, Oriented to Situation Alcohol / Substance Use: Not Applicable Psych Involvement: No (comment)  Admission diagnosis:  Primary hypertension [I10] Abdominal pain [R10.9] Left upper quadrant abdominal pain [R10.12] Acute gastritis [K29.00] Patient Active Problem List   Diagnosis Date Noted   Class 3 obesity ([place]) 09/01/2021   H/O atrial flutter 09/01/2021   Diabetic  neuropathy ([place]) 04/30/2021   Arthritis, multiple joint involvement 10/31/2020   Healthcare maintenance 10/31/2020   Depression 05/24/2015   OSA (obstructive sleep apnea) 04/04/2014   Acute  gastritis 12/29/2013   Mitral regurgitation 11/08/2013   Atrial flutter ([place]) 11/05/2013   Acute kidney injury superimposed on chronic kidney disease ([place]) 05/08/2013   Hypothyroidism 05/04/2013   Malignant neoplasm of upper-outer quadrant of right breast in female, estrogen receptor positive ([place]) 01/31/2013   Asthma 12/08/2010   Hypertension    Type 2 diabetes mellitus with hyperlipidemia ([place])    Tobacco abuse, in remission    PCP:  Coral Spikes, DO Pharmacy:   [place], [place] [address] [place] [address] Phone: [phone] Fax: [phone]     Social Determinants of Health (SDOH) Interventions    Readmission Risk Interventions     No data to display

## 2021-09-05 NOTE — Progress Notes (Signed)
PROGRESS NOTE    [name]  UUV:[id number] DOB: [birth year] DOA: 08/31/2021 PCP: Coral Spikes, DO   Brief Narrative:  [name] was admitted to the hospital with the working diagnosis of acute gastritis.   66 yo female with the past medical history of hypertension, T2DM, dyslipidemia, CKD and atrial fibrillation who presented with abdominal pain. Patient reported acute onset of abdominal pain that woke her up from her sleep. Her pain was epigastric and associated with nausea and vomiting. On her initial physical examination her blood pressure was 207/96, HR 106, RR 24 and 02 saturation 92%, lungs were clear to auscultation, heart with S1 and S2 present and rhythmic, abdomen tender to palpation with no rebound or guarding, no lower extremity edema.   She was stable for discharge 8/23 as her gastritis had improved and she was noted to have UTI with E. coli that was adequately treated.  Unfortunately she could not transfer to her vehicle with significant right knee osteoarthritis symptoms.  Therefore she was kept in the hospital for further PT evaluation.   Assessment & Plan:   Principal Problem:   Acute gastritis Active Problems:   Acute kidney injury superimposed on chronic kidney disease (HCC)   Type 2 diabetes mellitus with hyperlipidemia (HCC)   Hypertension   Hypothyroidism   OSA (obstructive sleep apnea)   Malignant neoplasm of upper-outer quadrant of right breast in female, estrogen receptor positive (HCC)   H/O atrial flutter   Class 3 obesity ([place])  Assessment and Plan:  Acute gastritis-improved GERD.   Patient with improvement in abdominal pain but not back to baseline, no further nausea or vomiting.    Plan to continue with IV proton pump inhibitor, 40 mg pantoprazole q12 hrs, may switch to oral per GI and the soft diet Continue pain control with as needed morphine and nausea control with zofran Continue IV fluids. [place] negative for cholecystitis, plan to  further advance diet at tolerated.  Out of bed to chair tid with meals, Pt and Ot.      Acute kidney injury superimposed on chronic kidney disease (HCC) CKD stage 2.   Creatinine improving, continue to hold ACE inhibitor and continue IV fluid   Plan to continue IV fluids with balanced electrolytes solutions at 75 ml per hr Follow up renal function in am, avoid hypotension and nephrotoxic medications.  Advance diet as tolerated.  Good urine output noted   E. coli UTI with ongoing fever Given fosfomycin yesterday prior to attempted discharge Due to ongoing fevers, check repeat blood cultures, procalcitonin low and no leukocytosis noted   Type 2 diabetes mellitus with hyperlipidemia (HCC) Hyperglycemia.    Glucose is 249 today, fasting.  Patient with improved appetite.  Plan to advance diet as tolerated, continue insulin therapy with sliding scale and resume basal insulin with 10 units, at home on 75/25 12 units bid.    Hypertension Continue blood pressure control with carvedilol, and amlodipine and hold benazepril given AKI   Hypothyroidism Continue with levothyroxine.    OSA (obstructive sleep apnea) Continue Cpap.    Malignant neoplasm of upper-outer quadrant of right breast in female, estrogen receptor positive ([name]) Follow up as outpatient.    H/O atrial flutter Continue anticoagulation with apixaban, patient on carvedilol for rate control.     Class 3 obesity (HCC) Calculated BMI is 54,7   Severe debility with right knee osteoarthritis Appreciate repeat PT/OT evaluation with need for likely SNF       DVT prophylaxis:  Eliquis Code Status: Full Family Communication: Daughter at bedside 8/24 Disposition Plan:  Status is: Inpatient Remains inpatient appropriate because: Need for IV fluid.     Consultants:  GI   Procedures:  None   Antimicrobials:  Anti-infectives (From admission, onward)    Start     Dose/Rate Route Frequency Ordered Stop   09/04/21  1000  fosfomycin (MONUROL) packet 3 g        3 g Oral  Once 09/04/21 0905 09/04/21 1115   09/03/21 0800  cefTRIAXone (ROCEPHIN) 1 g in sodium chloride 0.9 % 100 mL IVPB  Status:  Discontinued        1 g 200 mL/hr over 30 Minutes Intravenous Every 24 hours 09/03/21 0647 09/04/21 0910      Subjective: Patient seen and evaluated today with ongoing right knee pain and inability to walk.  She was noted to have fever last night.  She was unable to discharge due to her lack of ability to transfer.  Objective: Vitals:   09/05/21 0500 09/05/21 0600 09/05/21 0822 09/05/21 0920  BP:    136/67  Pulse:    88  Resp:      Temp:  98.7 F (37.1 C)    TempSrc:      SpO2:   95%   Weight: (!) 148.2 kg     Height:        Intake/Output Summary (Last 24 hours) at 09/05/2021 1036 Last data filed at 09/04/2021 2300 Gross per 24 hour  Intake 480 ml  Output 700 ml  Net -220 ml   Filed Weights   09/03/21 0456 09/04/21 0500 09/05/21 0500  Weight: (!) 147.8 kg (!) 150.8 kg (!) 148.2 kg    Examination:  General exam: Appears calm and comfortable, morbidly obese Respiratory system: Clear to auscultation. Respiratory effort normal. Cardiovascular system: S1 & S2 heard, RRR.  Gastrointestinal system: Abdomen is soft Central nervous system: Alert and awake Extremities: No edema Skin: No significant lesions noted Psychiatry: Flat affect.    Data Reviewed: I have personally reviewed following labs and imaging studies  CBC: Recent Labs  Lab 08/31/21 0944 09/01/21 0501 09/02/21 0551 09/03/21 0430 09/04/21 0531 09/05/21 0829  WBC 8.5 11.5* 12.0* 9.9 8.4 8.5  NEUTROABS 6.6  --  9.2*  --   --   --   HGB 13.0 11.7* 11.0* 10.3* 10.4* 10.5*  HCT 39.9 36.0 33.6* 31.7* 31.2* 32.2*  MCV 86.6 87.6 86.4 86.8 85.7 87.3  PLT 202 185 168 142* 164 762   Basic Metabolic Panel: Recent Labs  Lab 09/01/21 0501 09/02/21 0551 09/03/21 0430 09/04/21 0531 09/05/21 0829  NA 136 135 137 137 139  K 3.9 3.5  3.5 3.6 3.6  CL 101 102 105 105 105  CO2 '27 23 24 25 24  '$ GLUCOSE 249* 176* 171* 155* 180*  BUN 13 23 26* 21 15  CREATININE 1.44* 1.78* 1.63* 1.34* 1.33*  CALCIUM 8.3* 7.8* 7.7* 7.9* 7.8*  MG  --   --  1.6* 2.1  --    GFR: Estimated Creatinine Clearance: 60.1 mL/min (A) (by C-G formula based on SCr of 1.33 mg/dL (H)). Liver Function Tests: Recent Labs  Lab 08/31/21 0944  AST 30  ALT 21  ALKPHOS 84  BILITOT 0.8  PROT 8.6*  ALBUMIN 4.3   Recent Labs  Lab 08/31/21 0944  LIPASE 45   No results for input(s): "AMMONIA" in the last 168 hours. Coagulation Profile: No results for input(s): "INR", "PROTIME" in the last  168 hours. Cardiac Enzymes: No results for input(s): "CKTOTAL", "CKMB", "CKMBINDEX", "TROPONINI" in the last 168 hours. BNP (last 3 results) No results for input(s): "PROBNP" in the last 8760 hours. HbA1C: No results for input(s): "HGBA1C" in the last 72 hours. CBG: Recent Labs  Lab 09/04/21 0741 09/04/21 1113 09/04/21 1802 09/04/21 2108 09/05/21 0726  GLUCAP 173* 205* 219* 270* 190*   Lipid Profile: No results for input(s): "CHOL", "HDL", "LDLCALC", "TRIG", "CHOLHDL", "LDLDIRECT" in the last 72 hours. Thyroid Function Tests: No results for input(s): "TSH", "T4TOTAL", "FREET4", "T3FREE", "THYROIDAB" in the last 72 hours. Anemia Panel: No results for input(s): "VITAMINB12", "FOLATE", "FERRITIN", "TIBC", "IRON", "RETICCTPCT" in the last 72 hours. Sepsis Labs: Recent Labs  Lab 09/05/21 0829  PROCALCITON 0.23    Recent Results (from the past 240 hour(s))  Urine Culture     Status: Abnormal   Collection Time: 08/31/21 10:36 AM   Specimen: Urine, Clean Catch  Result Value Ref Range Status   Specimen Description   Final    URINE, CLEAN CATCH Performed at [facility], [address], [place], [place] [address]    Special Requests   Final    NONE Performed at [facility], [address], [place], [place] [address]    Culture >=100,000 COLONIES/mL  ESCHERICHIA COLI (A)  Final   Report Status 09/03/2021 FINAL  Final   Organism ID, Bacteria ESCHERICHIA COLI (A)  Final      Susceptibility   Escherichia coli - MIC*    AMPICILLIN >=32 RESISTANT Resistant     CEFAZOLIN <=4 SENSITIVE Sensitive     CEFEPIME <=0.12 SENSITIVE Sensitive     CEFTRIAXONE <=0.25 SENSITIVE Sensitive     CIPROFLOXACIN <=0.25 SENSITIVE Sensitive     GENTAMICIN <=1 SENSITIVE Sensitive     IMIPENEM <=0.25 SENSITIVE Sensitive     NITROFURANTOIN <=16 SENSITIVE Sensitive     TRIMETH/SULFA <=20 SENSITIVE Sensitive     AMPICILLIN/SULBACTAM 16 INTERMEDIATE Intermediate     PIP/TAZO <=4 SENSITIVE Sensitive     * >=100,000 COLONIES/mL ESCHERICHIA COLI  Culture, blood (Routine X 2) w Reflex to ID Panel     Status: None (Preliminary result)   Collection Time: 09/01/21 12:01 PM   Specimen: BLOOD LEFT HAND  Result Value Ref Range Status   Specimen Description   Final    BLOOD LEFT HAND BOTTLES DRAWN AEROBIC AND ANAEROBIC   Special Requests   Final    Blood Culture results may not be optimal due to an excessive volume of blood received in culture bottles   Culture   Final    NO GROWTH 4 DAYS Performed at [facility], [address] [place], [place] [address]    Report Status PENDING  Incomplete  Culture, blood (Routine X 2) w Reflex to ID Panel     Status: None (Preliminary result)   Collection Time: 09/01/21 12:14 PM   Specimen: BLOOD LEFT FOREARM  Result Value Ref Range Status   Specimen Description   Final    BLOOD LEFT FOREARM BOTTLES DRAWN AEROBIC AND ANAEROBIC   Special Requests   Final    Blood Culture results may not be optimal due to an excessive volume of blood received in culture bottles   Culture   Final    NO GROWTH 4 DAYS Performed at [facility], [address], [place], [place] [address]    Report Status PENDING  Incomplete  Culture, blood (Routine X 2) w Reflex to ID Panel     Status: None (  Preliminary result)   Collection Time: 09/05/21  8:30 AM    Specimen: Left Antecubital; Blood  Result Value Ref Range Status   Specimen Description LEFT ANTECUBITAL  Final   Special Requests   Final    BOTTLES DRAWN AEROBIC AND ANAEROBIC Blood Culture adequate volume Performed at [facility], [address] [place], [place] [address]    Culture PENDING  Incomplete   Report Status PENDING  Incomplete  Culture, blood (Routine X 2) w Reflex to ID Panel     Status: None (Preliminary result)   Collection Time: 09/05/21  8:30 AM   Specimen: BLOOD LEFT ARM  Result Value Ref Range Status   Specimen Description BLOOD LEFT ARM  Final   Special Requests   Final    BOTTLES DRAWN AEROBIC ONLY Blood Culture adequate volume Performed at [facility], [address] [place], [place] [address]    Culture PENDING  Incomplete   Report Status PENDING  Incomplete         Radiology Studies: No results found.      Scheduled Meds:  amLODipine  10 mg Oral Daily   apixaban  5 mg Oral BID   budesonide (PULMICORT) nebulizer solution  0.25 mg Nebulization BID   carvedilol  12.5 mg Oral BID WC   diclofenac Sodium  2 g Topical QID   insulin aspart  0-20 Units Subcutaneous TID WC   insulin glargine-yfgn  10 Units Subcutaneous Daily   levothyroxine  100 mcg Oral QAC breakfast   pantoprazole  40 mg Oral BID AC   pravastatin  40 mg Oral QHS   pregabalin  100 mg Oral Q8H   Continuous Infusions:  lactated ringers 75 mL/hr at 09/03/21 0700     LOS: 4 days    Time spent: 35 minutes    Nihal Marzella Darleen Crocker, DO [facility]  If 7PM-7AM, please contact night-coverage [email or web address] 09/05/2021, 10:36 AM

## 2021-09-05 NOTE — Progress Notes (Addendum)
Physical Therapy Treatment Patient Details Name: [name] MRN: [id number] DOB: 29-Apr-1955 Today's Date: 09/05/2021   History of Present Illness [name] is a 66 y.o. female with medical history significant of hypertension, diabetes, hyperlipidemia, asthma, chronic kidney disease, atrial fibrillation, GERD who presented to the emergency room today after awakening at 4 AM with severe abdominal pain.  Initially she thought it was indigestion because she had frequent burping but she had onset of epigastric abdominal pain and pain that was directly under her breast.  Pain is sharp and constant without radiation into her back or chest.  Round 8 AM she developed nausea and multiple episodes of nonbloody emesis and continues to have emesis while in the emergency department during my examination.  She also complains of abdominal pain as described above.  Has had no diarrhea or constipation.  Her last bowel movement was prior to arrival which did not affect her symptoms.  She has had no dysuria no fevers or chills no chest pain or shortness of breath beyond her baseline which she attributes to her asthma.  Took a Zantac prior to arrival to the emergency department but it did not improve her symptoms.  She reports that yesterday she had a hamburger with extra raw onions and some tomatoes on it and some cheese as well.  Rectus she had sausages and hashbrowns with water and she had a couple of brownies.  Usually does not eat that amount of raw onions but has had it bef    PT Comments    REASSESSMENT:  Patient demonstrates slow labored movement for sitting up at bedside having to use bed rails with HOB slightly raised and required frequent rest breaks.  Patient had most difficulty completing sit to stands due to c/o severe right knee pain with movement, worse when weightbearing requiring Mod assist and repeated verbal/tactile cueing and limited to a few slow labored side steps with flexed trunk before  having to sit due to fatigue/generalized weakness.  Patient tolerated sitting up in chair after therapy with her daughter present in room.  Patient will benefit from continued skilled physical therapy in hospital and recommended venue below to increase strength, balance, endurance for safe ADLs and gait.     Recommendations for follow up therapy are one component of a multi-disciplinary discharge planning process, led by the attending physician.  Recommendations may be updated based on patient status, additional functional criteria and insurance authorization.  Follow Up Recommendations  Skilled nursing-short term rehab (<3 hours/day)     Assistance Recommended at Discharge Set up Supervision/Assistance  Patient can return home with the following A lot of help with walking and/or transfers;A lot of help with bathing/dressing/bathroom;Assistance with cooking/housework;Help with stairs or ramp for entrance   Equipment Recommendations  Rolling walker (2 wheels) (Heavy duty RW)    Recommendations for Other Services       Precautions / Restrictions Precautions Precautions: Fall Restrictions Weight Bearing Restrictions: No     Mobility  Bed Mobility Overal bed mobility: Needs Assistance Bed Mobility: Supine to Sit     Supine to sit: Min assist, HOB elevated     General bed mobility comments: as per OT notes    Transfers Overall transfer level: Needs assistance Equipment used: Rolling walker (2 wheels) Transfers: Sit to/from Stand, Bed to chair/wheelchair/BSC Sit to Stand: Min assist   Step pivot transfers: Min assist, Mod assist       General transfer comment: requires much time and repeated verbal/tactile cueing to  transfer to chair    Ambulation/Gait Ambulation/Gait assistance: Mod assist Gait Distance (Feet): 5 Feet Assistive device: Rolling walker (2 wheels) Gait Pattern/deviations: Decreased step length - right, Decreased step length - left, Decreased stride  length, Trunk flexed Gait velocity: decreased     General Gait Details: very unsteady on feet with flexed trunk, requires much time with slow labored movement with most diffiuclty advancing RLE due to c/o severe pain   Stairs             Wheelchair Mobility    Modified Rankin (Stroke Patients Only)       Balance Overall balance assessment: Needs assistance Sitting-balance support: Feet supported, No upper extremity supported Sitting balance-Leahy Scale: Fair Sitting balance - Comments: fair/good seated at EOB   Standing balance support: Reliant on assistive device for balance, During functional activity, Bilateral upper extremity supported Standing balance-Leahy Scale: Poor                              Cognition Arousal/Alertness: Awake/alert Behavior During Therapy: WFL for tasks assessed/performed Overall Cognitive Status: Within Functional Limits for tasks assessed                                          Exercises      General Comments        Pertinent Vitals/Pain Pain Assessment Pain Assessment: 0-10 Pain Score: 7  Pain Location: R knee primarily ; L knee less Pain Descriptors / Indicators: Grimacing, Guarding, Sore Pain Intervention(s): Limited activity within patient's tolerance, Monitored during session, Premedicated before session, Repositioned    Home Living Family/patient expects to be discharged to:: Private residence Living Arrangements: Children Available Help at Discharge: Family;Available 24 hours/day Type of Home: House Home Access: Ramped entrance       Home Layout: One level Home Equipment: Air cabin crew (4 wheels);Grab bars - tub/shower Additional Comments: Taken via PT note    Prior Function            PT Goals (current goals can now be found in the care plan section) Acute Rehab PT Goals Patient Stated Goal: return home with family to assist PT Goal Formulation: With patient Time For  Goal Achievement: 09/17/21 Potential to Achieve Goals: Good Progress towards PT goals: Progressing toward goals    Frequency    Min 3X/week      PT Plan Discharge plan needs to be updated    Co-evaluation PT/OT/SLP Co-Evaluation/Treatment: Yes Reason for Co-Treatment: Complexity of the patient's impairments (multi-system involvement);To address functional/ADL transfers   OT goals addressed during session: ADL's and self-care      AM-PAC PT "6 Clicks" Mobility   Outcome Measure  Help needed turning from your back to your side while in a flat bed without using bedrails?: A Little Help needed moving from lying on your back to sitting on the side of a flat bed without using bedrails?: A Little Help needed moving to and from a bed to a chair (including a wheelchair)?: A Lot Help needed standing up from a chair using your arms (e.g., wheelchair or bedside chair)?: A Lot Help needed to walk in hospital room?: A Lot Help needed climbing 3-5 steps with a railing? : Total 6 Click Score: 13    End of Session Equipment Utilized During Treatment: Gait belt Activity Tolerance: Patient tolerated treatment  well;Patient limited by fatigue;Patient limited by pain Patient left: in chair;with call bell/phone within reach;with family/visitor present Nurse Communication: Mobility status PT Visit Diagnosis: Unsteadiness on feet (R26.81);Other abnormalities of gait and mobility (R26.89);Muscle weakness (generalized) (M62.81)     Time: 5053-9767 PT Time Calculation (min) (ACUTE ONLY): 32 min  Charges:  $Therapeutic Activity: 23-37 mins                     11:38 AM, 09/05/21 Lonell Grandchild, MPT Physical Therapist with [facility] 336 [phone] office [phone] mobile phone

## 2021-09-05 NOTE — Evaluation (Signed)
Occupational Therapy Evaluation Patient Details Name: [name] MRN: [id number] DOB: [birth year] Today's Date: 09/05/2021   History of Present Illness [name] is a 66 y.o. female with medical history significant of hypertension, diabetes, hyperlipidemia, asthma, chronic kidney disease, atrial fibrillation, GERD who presented to the emergency room today after awakening at 4 AM with severe abdominal pain.  Initially she thought it was indigestion because she had frequent burping but she had onset of epigastric abdominal pain and pain that was directly under her breast.  Pain is sharp and constant without radiation into her back or chest.  Round 8 AM she developed nausea and multiple episodes of nonbloody emesis and continues to have emesis while in the emergency department during my examination.  She also complains of abdominal pain as described above.  Has had no diarrhea or constipation.  Her last bowel movement was prior to arrival which did not affect her symptoms.  She has had no dysuria no fevers or chills no chest pain or shortness of breath beyond her baseline which she attributes to her asthma.  Took a Zantac prior to arrival to the emergency department but it did not improve her symptoms.  She reports that yesterday she had a hamburger with extra raw onions and some tomatoes on it and some cheese as well.  Rectus she had sausages and hashbrowns with water and she had a couple of brownies.  Usually does not eat that amount of raw onions but has had it bef   Clinical Impression   Pt agreeable to OT and PT co-evaluation. Pt presets with knee pain primarily in R knee today.  B UE is generally weak but able to use functionally. Pt requires much assist for sit to stand at this time with use of RW and gait belt. Once up pt required extended time and rest breaks leaning over RW. Pt appears deconditioned with much more difficulty transferring than was reported at baseline. Pt will benefit from  continued OT in the hospital and recommended venue below to increase strength, balance, and endurance for safe ADL's.        Recommendations for follow up therapy are one component of a multi-disciplinary discharge planning process, led by the attending physician.  Recommendations may be updated based on patient status, additional functional criteria and insurance authorization.   Follow Up Recommendations  Skilled nursing-short term rehab (<3 hours/day)    Assistance Recommended at Discharge Intermittent Supervision/Assistance  Patient can return home with the following A lot of help with walking and/or transfers;A lot of help with bathing/dressing/bathroom;Help with stairs or ramp for entrance;Assist for transportation;Assistance with cooking/housework    Functional Status Assessment  Patient has had a recent decline in their functional status and demonstrates the ability to make significant improvements in function in a reasonable and predictable amount of time.  Equipment Recommendations  None recommended by OT    Recommendations for Other Services       Precautions / Restrictions Precautions Precautions: Fall Restrictions Weight Bearing Restrictions: No      Mobility Bed Mobility Overal bed mobility: Needs Assistance Bed Mobility: Supine to Sit     Supine to sit: Min assist, HOB elevated     General bed mobility comments: increased time and labored movement.    Transfers Overall transfer level: Needs assistance Equipment used: Rolling walker (2 wheels) Transfers: Sit to/from Stand, Bed to chair/wheelchair/BSC Sit to Stand: Max assist     Step pivot transfers: Mod assist  General transfer comment: Extended time for transfer including rest breaks with pt leaning over RW.      Balance               Standing balance comment: using RW                           ADL either performed or assessed with clinical judgement   ADL Overall ADL's  : Needs assistance/impaired     Grooming: Maximal assistance;Standing   Upper Body Bathing: Supervision/ safety;Sitting   Lower Body Bathing: Moderate assistance;Maximal assistance;Sitting/lateral leans   Upper Body Dressing : Supervision/safety;Sitting   Lower Body Dressing: Maximal assistance;Sitting/lateral leans   Toilet Transfer: Maximal assistance;+2 for physical assistance;Stand-pivot;Rolling walker (2 wheels) Toilet Transfer Details (indicate cue type and reason): Simulated via EOB to chair transer with RW. Toileting- Clothing Manipulation and Hygiene: Moderate assistance;Sitting/lateral lean               Vision Baseline Vision/History: 1 Wears glasses Ability to See in Adequate Light: 0 Adequate Patient Visual Report: No change from baseline Vision Assessment?: No apparent visual deficits     Perception     Praxis      Pertinent Vitals/Pain Pain Assessment Pain Assessment: 0-10 Pain Score: 7  Pain Location: R knee primarily ; L knee less Pain Descriptors / Indicators: Grimacing, Guarding, Sore Pain Intervention(s): Limited activity within patient's tolerance, Monitored during session, Repositioned, Premedicated before session     Hand Dominance Left   Extremity/Trunk Assessment Upper Extremity Assessment Upper Extremity Assessment: Generalized weakness   Lower Extremity Assessment Lower Extremity Assessment: Defer to PT evaluation   Cervical / Trunk Assessment Cervical / Trunk Assessment: Kyphotic   Communication Communication Communication: No difficulties   Cognition Arousal/Alertness: Awake/alert Behavior During Therapy: WFL for tasks assessed/performed Overall Cognitive Status: Within Functional Limits for tasks assessed                                                        Home Living Family/patient expects to be discharged to:: Private residence Living Arrangements: Children Available Help at Discharge:  Family;Available 24 hours/day Type of Home: House Home Access: Ramped entrance     Home Layout: One level     Bathroom Shower/Tub: Teacher, early years/pre: Handicapped height Bathroom Accessibility: Yes   Home Equipment: Air cabin crew (4 wheels);Grab bars - tub/shower   Additional Comments: Taken via PT note      Prior Functioning/Environment Prior Level of Function : Needs assist       Physical Assist : Mobility (physical);ADLs (physical) Mobility (physical): Bed mobility;Transfers;Gait;Stairs ADLs (physical): Dressing;IADLs Mobility Comments: household ambulation using Rollator ADLs Comments: Pt reports independence with ADL's other than putting socks on. PRN assist for IADL's of cooking.        OT Problem List: Decreased strength;Decreased activity tolerance;Impaired balance (sitting and/or standing)      OT Treatment/Interventions: Self-care/ADL training;Therapeutic exercise;Therapeutic activities;Patient/family education;Balance training    OT Goals(Current goals can be found in the care plan section) Acute Rehab OT Goals Patient Stated Goal: return home OT Goal Formulation: With patient Time For Goal Achievement: 09/19/21 Potential to Achieve Goals: Fair  OT Frequency: Min 2X/week    Co-evaluation PT/OT/SLP Co-Evaluation/Treatment: Yes Reason for Co-Treatment: Complexity of the patient's impairments (multi-system involvement);To  address functional/ADL transfers   OT goals addressed during session: ADL's and self-care                       End of Session Equipment Utilized During Treatment: Rolling walker (2 wheels);Gait belt  Activity Tolerance: Patient tolerated treatment well Patient left: in chair;with call bell/phone within reach;with family/visitor present  OT Visit Diagnosis: Unsteadiness on feet (R26.81);Other abnormalities of gait and mobility (R26.89);Muscle weakness (generalized) (M62.81)                Time:  7034-0352 OT Time Calculation (min): 30 min Charges:  OT General Charges $OT Visit: 1 Visit OT Evaluation $OT Eval Moderate Complexity: 1 Mod Tressa Maldonado OT, MOT  Larey Seat 09/05/2021, 11:30 AM

## 2021-09-05 NOTE — NC FL2 (Signed)
[place] LEVEL OF CARE SCREENING TOOL     IDENTIFICATION  Patient Name: [name] Birthdate: 10-09-1955 Sex: female Admission Date (Current Location): 08/31/2021  [facility] and [place] Number:  [employer] and Address:  [place] [address], [place]      Provider Number: [id number]  Attending Physician Name and Address:  Rodena Goldmann, DO  Relative Name and Phone Number:       Current Level of Care: Hospital Recommended Level of Care: [place] Prior Approval Number:    Date Approved/Denied:   PASRR Number: [id number]  Discharge Plan: SNF    Current Diagnoses: Patient Active Problem List   Diagnosis Date Noted   Class 3 obesity ([place]) 09/01/2021   H/O atrial flutter 09/01/2021   Diabetic neuropathy ([place]) 04/30/2021   Arthritis, multiple joint involvement 10/31/2020   Healthcare maintenance 10/31/2020   Depression 05/24/2015   OSA (obstructive sleep apnea) 04/04/2014   Acute gastritis 12/29/2013   Mitral regurgitation 11/08/2013   Atrial flutter ([place]) 11/05/2013   Acute kidney injury superimposed on chronic kidney disease ([place]) 05/08/2013   Hypothyroidism 05/04/2013   Malignant neoplasm of upper-outer quadrant of right breast in female, estrogen receptor positive ([place]) 01/31/2013   Asthma 12/08/2010   Hypertension    Type 2 diabetes mellitus with hyperlipidemia (HCC)    Tobacco abuse, in remission     Orientation RESPIRATION BLADDER Height & Weight     Self, Time, Situation, Place  O2 Continent Weight: (!) 326 lb 11.6 oz (148.2 kg) Height:  5' 4.5" (163.8 cm)  BEHAVIORAL SYMPTOMS/MOOD NEUROLOGICAL BOWEL NUTRITION STATUS      Continent Diet (see dc summary)  AMBULATORY STATUS COMMUNICATION OF NEEDS Skin   Extensive Assist Verbally Normal                       Personal Care Assistance Level of Assistance  Bathing, Feeding, Dressing Bathing Assistance: Limited  assistance Feeding assistance: Independent Dressing Assistance: Limited assistance     Functional Limitations Info  Sight, Hearing, Speech Sight Info: Adequate Hearing Info: Adequate Speech Info: Adequate    SPECIAL CARE FACTORS FREQUENCY  PT (By licensed PT), OT (By licensed OT)     PT Frequency: 5x week OT Frequency: 3x week            Contractures Contractures Info: Not present    Additional Factors Info  Code Status, Allergies Code Status Info: Full Allergies Info: Procardia, Aspirin, Diltiazem           Current Medications (09/05/2021):  This is the current hospital active medication list Current Facility-Administered Medications  Medication Dose Route Frequency Provider Last Rate Last Admin   acetaminophen (TYLENOL) tablet 650 mg  650 mg Oral Q6H PRN Lady Deutscher, MD   650 mg at 09/04/21 1459   Or   acetaminophen (TYLENOL) suppository 650 mg  650 mg Rectal Q6H PRN Lady Deutscher, MD       albuterol (PROVENTIL) (2.5 MG/3ML) 0.083% nebulizer solution 2.5 mg  2.5 mg Nebulization Q6H PRN Lady Deutscher, MD   2.5 mg at 09/01/21 1745   albuterol (VENTOLIN HFA) 108 (90 Base) MCG/ACT inhaler 2 puff  2 puff Inhalation Q6H PRN Manuella Ghazi, Pratik D, DO       amLODipine (NORVASC) tablet 10 mg  10 mg Oral Daily Lady Deutscher, MD   10 mg at 09/05/21 0923   apixaban (ELIQUIS) tablet 5 mg  5 mg Oral BID Lady Deutscher, MD   5 mg at 09/05/21 0920   budesonide (PULMICORT) nebulizer solution 0.25 mg  0.25 mg Nebulization BID Lady Deutscher, MD   0.25 mg at 09/05/21 7001   carvedilol (COREG) tablet 12.5 mg  12.5 mg Oral BID WC Lady Deutscher, MD   12.5 mg at 09/05/21 0920   diclofenac Sodium (VOLTAREN) 1 % topical gel 2 g  2 g Topical QID Tawni Millers, MD   2 g at 09/05/21 7494   insulin aspart (novoLOG) injection 0-20 Units  0-20 Units Subcutaneous TID WC Lady Deutscher, MD   7 Units at 09/05/21 1204   insulin glargine-yfgn (SEMGLEE) injection  10 Units  10 Units Subcutaneous Daily Tawni Millers, MD   10 Units at 09/05/21 4967   lactated ringers infusion   Intravenous Continuous Heath Lark D, DO 75 mL/hr at 09/03/21 0700 Infusion Verify at 09/03/21 0700   levothyroxine (SYNTHROID) tablet 100 mcg  100 mcg Oral QAC breakfast Lady Deutscher, MD   100 mcg at 09/05/21 0524   morphine (PF) 2 MG/ML injection 2 mg  2 mg Intravenous Q2H PRN Arrien, Jimmy Picket, MD   2 mg at 09/02/21 1238   ondansetron (ZOFRAN) tablet 4 mg  4 mg Oral Q6H PRN Arrien, Jimmy Picket, MD       Or   ondansetron [facility]) injection 4 mg  4 mg Intravenous Q6H PRN Arrien, Jimmy Picket, MD       oxyCODONE (Oxy IR/ROXICODONE) immediate release tablet 5 mg  5 mg Oral Q4H PRN Arrien, Jimmy Picket, MD   5 mg at 09/05/21 0923   pantoprazole (PROTONIX) EC tablet 40 mg  40 mg Oral BID AC Annitta Needs, NP   40 mg at 09/05/21 0523   polyethylene glycol (MIRALAX / GLYCOLAX) packet 17 g  17 g Oral Daily PRN Lady Deutscher, MD       pravastatin (PRAVACHOL) tablet 40 mg  40 mg Oral QHS Lady Deutscher, MD   40 mg at 09/04/21 2205   pregabalin (LYRICA) capsule 100 mg  100 mg Oral Q8H Lady Deutscher, MD   100 mg at 09/05/21 0524   traZODone (DESYREL) tablet 50 mg  50 mg Oral QHS PRN Lady Deutscher, MD   50 mg at 09/01/21 2130     Discharge Medications: Please see discharge summary for a list of discharge medications.  Relevant Imaging Results:  Relevant Lab Results:   Additional Information SSN: [id number] [place]

## 2021-09-06 ENCOUNTER — Telehealth: Payer: Self-pay | Admitting: Nurse Practitioner

## 2021-09-06 ENCOUNTER — Inpatient Hospital Stay (HOSPITAL_COMMUNITY): Payer: Medicare Other

## 2021-09-06 DIAGNOSIS — K2900 Acute gastritis without bleeding: Secondary | ICD-10-CM | POA: Diagnosis not present

## 2021-09-06 LAB — CULTURE, BLOOD (ROUTINE X 2)
Culture: NO GROWTH
Culture: NO GROWTH

## 2021-09-06 LAB — GLUCOSE, CAPILLARY
Glucose-Capillary: 148 mg/dL — ABNORMAL HIGH (ref 70–99)
Glucose-Capillary: 175 mg/dL — ABNORMAL HIGH (ref 70–99)
Glucose-Capillary: 175 mg/dL — ABNORMAL HIGH (ref 70–99)
Glucose-Capillary: 146 mg/dL — ABNORMAL HIGH (ref 70–99)

## 2021-09-06 MED ORDER — ALUM & MAG HYDROXIDE-SIMETH 200-200-20 MG/5ML PO SUSP
30.0000 mL | Freq: Once | ORAL | Status: AC
  Administered 2021-09-06: 30 mL via ORAL
  Filled 2021-09-06: qty 30

## 2021-09-06 NOTE — Consult Note (Signed)
[facility] for Infectious Disease    Date of Admission:  08/31/2021           Reason for Consult: Fevers   Principal Problem:   Acute gastritis Active Problems:   Hypertension   Type 2 diabetes mellitus with hyperlipidemia (HCC)   Malignant neoplasm of upper-outer quadrant of right breast in female, estrogen receptor positive ([place])   Hypothyroidism   Acute kidney injury superimposed on chronic kidney disease (HCC)   OSA (obstructive sleep apnea)   Class 3 obesity (HCC)   H/O atrial flutter   Assessment: 66 year old female admitted with abdominal pain thought to be gastritis found to have:  #Persistent fevers #History of malignant neoplasm of upper and outer right breast #Urine cultures positive for E. coli treated with fosfomycin #Abdominal pain #Right knee pain -CT abdomen pelvis showed no acute abdominal/pelvic pathology.  Cholelithiasis without gallbladder wall thickening or biliary duct dilatation.  Right upper quadrant ultrasound showed gallstones and hepatic steatosis.  GI plans to do EGD outpatient. -ID was engaged given negative blood cultures and persistent fevers. Recommendations:  -Blood cultures remain negative, patient has no leukocytosis.  Given her history of breast cancer I am concerned about malignancy including fevers.  She can follow-up with oncology.  -Don't see indication to start antibiotics as Cx remain negative - Xray right knee(per primary no overt signs of septic joint-no erythema) - Quantiferon  Microbiology:   Antibiotics: Ceftriaxone 8/22 Fosfomycin 8/23  Cultures: Blood 8/20 NG 8/21 NG Urine 8/19 Ecoli   HPI: [name] is a 66 y.o. female with hypertension, diabetes, hyperlipidemia, asthma, CKD, A-fib, GERD admitted to [facility] for abdominal pain suspected secondary to gastritis/gastric ulcer.  Noted to have abdominal pain starting a.m. of admission.  Pain was severe in intensity.  She had developed multiple  episodes of nausea and nonbloody emesis.  No dysuria, fevers or chills were noted.  CT abdomen pelvis showed no acute abdominal/pelvic pathology.  Cholelithiasis without gallbladder wall thickening or biliary duct dilatation.  Right upper quadrant ultrasound showed gallstones and hepatic steatosis.  Urine cultures came back positive for E. coli and she received a dose of fosfomycin.  She is thought to have acute prostatitis with intermittent symptoms of GERD.,  GI plans to follow-up outpatient with EGD.  ID engaged as patient continued to have fevers overnight.  She had worsening right knee pain. Past Medical History:  Diagnosis Date   Asthma    prn neb. and inhaler   Breast cancer ([place]) 01/2013   right   Dehydration 04/13/2013   GERD (gastroesophageal reflux disease)    Graves' disease    Hyperlipidemia    Hypertension    on multiple meds., has been on med. > 14 yr.   Non-insulin dependent type 2 diabetes mellitus ([place])    Obesity    Ophthalmic manifestation of Graves disease    Osteoarthritis 2003   left knee   Radiation 10/11/13-11/29/13   Right Breast   Sleep apnea 4/16   mod   Wears glasses     Social History   Tobacco Use   Smoking status: Former    Packs/day: 0.25    Years: 20.00    Total pack years: 5.00    Types: Cigarettes    Quit date: 02/10/1991    Years since quitting: 30.5    Passive exposure: Never   Smokeless tobacco: Never  Vaping Use   Vaping Use: Never used  Substance Use Topics  Alcohol use: No   Drug use: No    Family History  Problem Relation Age of Onset   Diabetes Mother    Heart disease Father    Lung cancer Father    Diabetes Sister    Hypertension Sister    Asthma Sister    Rheum arthritis Daughter    Hypertension Daughter    Fibromyalgia Daughter    Hypertension Daughter    Scheduled Meds:  amLODipine  10 mg Oral Daily   apixaban  5 mg Oral BID   budesonide (PULMICORT) nebulizer solution  0.25 mg Nebulization BID   carvedilol  12.5  mg Oral BID WC   diclofenac Sodium  4 g Topical QID   insulin aspart  0-20 Units Subcutaneous TID WC   insulin glargine-yfgn  10 Units Subcutaneous Daily   levothyroxine  100 mcg Oral QAC breakfast   pantoprazole  40 mg Oral BID AC   pravastatin  40 mg Oral QHS   pregabalin  100 mg Oral Q8H   Continuous Infusions: PRN Meds:.acetaminophen **OR** acetaminophen, albuterol, albuterol, morphine injection, ondansetron **OR** ondansetron (ZOFRAN) IV, oxyCODONE, polyethylene glycol, traZODone Allergies  Allergen Reactions   Procardia [Nifedipine] Other (See Comments)    MIGRAINES   Aspirin     Unable to take due to blood pressure medication   Diltiazem Itching    OBJECTIVE: Blood pressure (!) 148/70, pulse 88, temperature 99.8 F (37.7 C), temperature source Oral, resp. rate 20, height 5' 4.5" (1.638 m), weight (!) 146.2 kg, SpO2 97 %.  Lab Results Lab Results  Component Value Date   WBC 8.5 09/05/2021   HGB 10.5 (L) 09/05/2021   HCT 32.2 (L) 09/05/2021   MCV 87.3 09/05/2021   PLT 168 09/05/2021    Lab Results  Component Value Date   CREATININE 1.33 (H) 09/05/2021   BUN 15 09/05/2021   NA 139 09/05/2021   K 3.6 09/05/2021   CL 105 09/05/2021   CO2 24 09/05/2021    Lab Results  Component Value Date   ALT 21 08/31/2021   AST 30 08/31/2021   ALKPHOS 84 08/31/2021   BILITOT 0.8 08/31/2021     Discussed plan and case with primary.   Laurice Record, MD [place] [facility] 09/06/2021, 3:55 PM

## 2021-09-06 NOTE — Telephone Encounter (Signed)
Patient's daughter brought in FMLA to be complete for her. She has been out of work a few days taking care of her mom.Patient is still in hospital as of 8/25

## 2021-09-06 NOTE — TOC Progression Note (Addendum)
Transition of Care [facility]) - Progression Note    Patient Details  Name: [name] MRN: [id number] Date of Birth: 1955/11/12  Transition of Care [facility]) CM/SW Contact  Shade Flood, LCSW Phone Number: 09/06/2021, 10:48 AM  Clinical Narrative:     TOC following. Per MD, pt with fevers overnight. Pt not medically stable for dc to SNF today as originally planned. SNF auth request cancelled. Updated [name]. Per MD, pt may transfer to [facility].  TOC will follow.  Expected Discharge Plan: [place] Barriers to Discharge: Continued Medical Work up  Expected Discharge Plan and Services Expected Discharge Plan: [place] Choice: [place] arrangements for the past 2 months: Single Family Home Expected Discharge Date: 09/04/21                                     Social Determinants of Health (SDOH) Interventions    Readmission Risk Interventions     No data to display

## 2021-09-06 NOTE — Progress Notes (Signed)
PROGRESS NOTE    ASAL TEAS  PJK:[id number] DOB: 1955-02-10 DOA: 08/31/2021 PCP: Coral Spikes, DO   Brief Narrative:  [name] was admitted to the hospital with the working diagnosis of acute gastritis.   66 yo female with the past medical history of hypertension, T2DM, dyslipidemia, CKD and atrial fibrillation who presented with abdominal pain. Patient reported acute onset of abdominal pain that woke her up from her sleep. Her pain was epigastric and associated with nausea and vomiting. On her initial physical examination her blood pressure was 207/96, HR 106, RR 24 and 02 saturation 92%, lungs were clear to auscultation, heart with S1 and S2 present and rhythmic, abdomen tender to palpation with no rebound or guarding, no lower extremity edema.   She was stable for discharge 8/23 as her gastritis had improved and she was noted to have UTI with E. coli that was adequately treated.  Unfortunately she could not transfer to her vehicle with significant right knee osteoarthritis symptoms.  Therefore she was kept in the hospital for further PT evaluation.  PT now recommending SNF on discharge.  Unfortunately she continues to have some ongoing fevers overnight with unknown origin.  Blood cultures remain negative at this time and procalcitonin low.  Continue to follow CBC.  ID consulted for further evaluation and management and this is pending.  Patient and family requesting transfer to [facility] as she has been previously hospitalized there and does not want to stay at [facility].  Transfer order has been placed.   Assessment & Plan:   Principal Problem:   Acute gastritis Active Problems:   Acute kidney injury superimposed on chronic kidney disease (HCC)   Type 2 diabetes mellitus with hyperlipidemia (HCC)   Hypertension   Hypothyroidism   OSA (obstructive sleep apnea)   Malignant neoplasm of upper-outer quadrant of right breast in female, estrogen receptor positive (HCC)   H/O  atrial flutter   Class 3 obesity (HCC)  Assessment and Plan:   Acute gastritis-with intermittent symptoms GERD.   Patient with improvement in abdominal pain but not back to baseline, no further nausea or vomiting.    Plan to continue PPI 40 mg twice daily and consider addition of Carafate as needed Continue pain control with as needed morphine and nausea control with zofran Continue IV fluids. [place] negative for cholecystitis, plan to further advance diet at tolerated.  Out of bed to chair tid with meals, Pt and Ot.  -Given a dose of Maalox today and GI plans to follow-up outpatient with EGD -Reconsult GI as needed if symptoms continue to worsen     Acute kidney injury superimposed on chronic kidney disease ([place]) CKD stage 2-resolved   Creatinine improving, continue to hold ACE inhibitor; no further need for IV fluid as patient is tolerating diet Follow up renal function in am, avoid hypotension and nephrotoxic medications.  Advance diet as tolerated.  Good urine output noted   E. coli UTI with ongoing fever of unknown origin Given fosfomycin 8/23 prior to attempted discharge Ongoing fever of up to 102 Fahrenheit overnight Cultures negative thus far and procalcitonin low Monitor CBC Right knee pain from osteoarthritis noted, however knee does not appear septic on exam Consult to ID Dr. Candiss Norse who is aware, appreciate further evaluation and management.   Type 2 diabetes mellitus with hyperlipidemia ([place]) Hyperglycemia-improved Patient with improved appetite.  Plan to advance diet as tolerated, continue insulin therapy with sliding scale and resume basal insulin with 10 units, at  home on 75/25 12 units bid.    Hypertension Continue blood pressure control with carvedilol, and amlodipine and hold benazepril given AKI   Hypothyroidism Continue with levothyroxine.    OSA (obstructive sleep apnea) Continue Cpap.    Malignant neoplasm of upper-outer quadrant of right breast in  female, estrogen receptor positive ([place]) Follow up as outpatient.    H/O atrial flutter Continue anticoagulation with apixaban, patient on carvedilol for rate control.     Class 3 obesity (HCC) Calculated BMI is 54,7    Severe debility with right knee osteoarthritis Patient will require SNF on discharge     DVT prophylaxis: Eliquis Code Status: Full Family Communication: Daughter at bedside 8/24 Disposition Plan:  Status is: Inpatient Remains inpatient appropriate because: Need for IV fluid.     Consultants:  GI   Procedures:  None   Antimicrobials:  Anti-infectives (From admission, onward)    Start     Dose/Rate Route Frequency Ordered Stop   09/04/21 1000  fosfomycin (MONUROL) packet 3 g        3 g Oral  Once 09/04/21 0905 09/04/21 1115   09/03/21 0800  cefTRIAXone (ROCEPHIN) 1 g in sodium chloride 0.9 % 100 mL IVPB  Status:  Discontinued        1 g 200 mL/hr over 30 Minutes Intravenous Every 24 hours 09/03/21 0647 09/04/21 0910       Subjective: Patient seen and evaluated today with ongoing weakness and pain to her right knee.  She continues to have fevers overnight up to 102 Fahrenheit.  She is complaining of some mild epigastric abdominal pain this morning and is requesting transfer to [facility] as she normally gets admitted at that facility if she ever has medical problems.  Objective: Vitals:   09/05/21 2200 09/06/21 0500 09/06/21 0523 09/06/21 0808  BP:   (!) 145/60 136/65  Pulse:   90 66  Resp:   19   Temp: 99.9 F (37.7 C)  99.4 F (37.4 C)   TempSrc:   Oral   SpO2:   95%   Weight:  (!) 146.2 kg    Height:        Intake/Output Summary (Last 24 hours) at 09/06/2021 1115 Last data filed at 09/06/2021 0700 Gross per 24 hour  Intake 480 ml  Output 1000 ml  Net -520 ml   Filed Weights   09/04/21 0500 09/05/21 0500 09/06/21 0500  Weight: (!) 150.8 kg (!) 148.2 kg (!) 146.2 kg    Examination:  General exam: Appears calm and comfortable,  obese Respiratory system: Clear to auscultation. Respiratory effort normal. Cardiovascular system: S1 & S2 heard, RRR.  Gastrointestinal system: Abdomen is soft Central nervous system: Alert and awake Extremities: No edema, right knee without significant erythema, swelling, or tenderness Skin: No significant lesions noted Psychiatry: Flat affect.    Data Reviewed: I have personally reviewed following labs and imaging studies  CBC: Recent Labs  Lab 08/31/21 0944 09/01/21 0501 09/02/21 0551 09/03/21 0430 09/04/21 0531 09/05/21 0829  WBC 8.5 11.5* 12.0* 9.9 8.4 8.5  NEUTROABS 6.6  --  9.2*  --   --   --   HGB 13.0 11.7* 11.0* 10.3* 10.4* 10.5*  HCT 39.9 36.0 33.6* 31.7* 31.2* 32.2*  MCV 86.6 87.6 86.4 86.8 85.7 87.3  PLT 202 185 168 142* 164 213   Basic Metabolic Panel: Recent Labs  Lab 09/01/21 0501 09/02/21 0551 09/03/21 0430 09/04/21 0531 09/05/21 0829  NA 136 135 137 137 139  K 3.9 3.5 3.5 3.6 3.6  CL 101 102 105 105 105  CO2 '27 23 24 25 24  '$ GLUCOSE 249* 176* 171* 155* 180*  BUN 13 23 26* 21 15  CREATININE 1.44* 1.78* 1.63* 1.34* 1.33*  CALCIUM 8.3* 7.8* 7.7* 7.9* 7.8*  MG  --   --  1.6* 2.1  --    GFR: Estimated Creatinine Clearance: 59.6 mL/min (A) (by C-G formula based on SCr of 1.33 mg/dL (H)). Liver Function Tests: Recent Labs  Lab 08/31/21 0944  AST 30  ALT 21  ALKPHOS 84  BILITOT 0.8  PROT 8.6*  ALBUMIN 4.3   Recent Labs  Lab 08/31/21 0944  LIPASE 45   No results for input(s): "AMMONIA" in the last 168 hours. Coagulation Profile: No results for input(s): "INR", "PROTIME" in the last 168 hours. Cardiac Enzymes: No results for input(s): "CKTOTAL", "CKMB", "CKMBINDEX", "TROPONINI" in the last 168 hours. BNP (last 3 results) No results for input(s): "PROBNP" in the last 8760 hours. HbA1C: No results for input(s): "HGBA1C" in the last 72 hours. CBG: Recent Labs  Lab 09/05/21 0726 09/05/21 1138 09/05/21 1640 09/05/21 2202  09/06/21 0751  GLUCAP 190* 210* 171* 130* 148*   Lipid Profile: No results for input(s): "CHOL", "HDL", "LDLCALC", "TRIG", "CHOLHDL", "LDLDIRECT" in the last 72 hours. Thyroid Function Tests: No results for input(s): "TSH", "T4TOTAL", "FREET4", "T3FREE", "THYROIDAB" in the last 72 hours. Anemia Panel: No results for input(s): "VITAMINB12", "FOLATE", "FERRITIN", "TIBC", "IRON", "RETICCTPCT" in the last 72 hours. Sepsis Labs: Recent Labs  Lab 09/05/21 0829  PROCALCITON 0.23    Recent Results (from the past 240 hour(s))  Urine Culture     Status: Abnormal   Collection Time: 08/31/21 10:36 AM   Specimen: Urine, Clean Catch  Result Value Ref Range Status   Specimen Description   Final    URINE, CLEAN CATCH Performed at [facility], [address], [place], [place] [address]    Special Requests   Final    NONE Performed at [facility], [address] [place], [place] [address]    Culture >=100,000 COLONIES/mL ESCHERICHIA COLI (A)  Final   Report Status 09/03/2021 FINAL  Final   Organism ID, Bacteria ESCHERICHIA COLI (A)  Final      Susceptibility   Escherichia coli - MIC*    AMPICILLIN >=32 RESISTANT Resistant     CEFAZOLIN <=4 SENSITIVE Sensitive     CEFEPIME <=0.12 SENSITIVE Sensitive     CEFTRIAXONE <=0.25 SENSITIVE Sensitive     CIPROFLOXACIN <=0.25 SENSITIVE Sensitive     GENTAMICIN <=1 SENSITIVE Sensitive     IMIPENEM <=0.25 SENSITIVE Sensitive     NITROFURANTOIN <=16 SENSITIVE Sensitive     TRIMETH/SULFA <=20 SENSITIVE Sensitive     AMPICILLIN/SULBACTAM 16 INTERMEDIATE Intermediate     PIP/TAZO <=4 SENSITIVE Sensitive     * >=100,000 COLONIES/mL ESCHERICHIA COLI  Culture, blood (Routine X 2) w Reflex to ID Panel     Status: None   Collection Time: 09/01/21 12:01 PM   Specimen: BLOOD LEFT HAND  Result Value Ref Range Status   Specimen Description   Final    BLOOD LEFT HAND BOTTLES DRAWN AEROBIC AND ANAEROBIC   Special Requests   Final    Blood Culture results  may not be optimal due to an excessive volume of blood received in culture bottles   Culture   Final    NO GROWTH 5 DAYS Performed at [facility], [address], [place],  [address]  Report Status 09/06/2021 FINAL  Final  Culture, blood (Routine X 2) w Reflex to ID Panel     Status: None   Collection Time: 09/01/21 12:14 PM   Specimen: BLOOD LEFT FOREARM  Result Value Ref Range Status   Specimen Description   Final    BLOOD LEFT FOREARM BOTTLES DRAWN AEROBIC AND ANAEROBIC   Special Requests   Final    Blood Culture results may not be optimal due to an excessive volume of blood received in culture bottles   Culture   Final    NO GROWTH 5 DAYS Performed at [facility], [address] [place], [place] [address]    Report Status 09/06/2021 FINAL  Final  Culture, blood (Routine X 2) w Reflex to ID Panel     Status: None (Preliminary result)   Collection Time: 09/05/21  8:30 AM   Specimen: Left Antecubital; Blood  Result Value Ref Range Status   Specimen Description LEFT ANTECUBITAL  Final   Special Requests   Final    BOTTLES DRAWN AEROBIC AND ANAEROBIC Blood Culture adequate volume   Culture   Final    NO GROWTH < 24 HOURS Performed at [facility], [address] [place], [place] [address]    Report Status PENDING  Incomplete  Culture, blood (Routine X 2) w Reflex to ID Panel     Status: None (Preliminary result)   Collection Time: 09/05/21  8:30 AM   Specimen: BLOOD LEFT ARM  Result Value Ref Range Status   Specimen Description BLOOD LEFT ARM  Final   Special Requests   Final    BOTTLES DRAWN AEROBIC ONLY Blood Culture adequate volume   Culture   Final    NO GROWTH < 24 HOURS Performed at [facility], [address], [place], [name] [address]    Report Status PENDING  Incomplete         Radiology Studies: No results found.      Scheduled Meds:  amLODipine  10 mg Oral Daily   apixaban  5 mg Oral BID   budesonide (PULMICORT) nebulizer solution  0.25  mg Nebulization BID   carvedilol  12.5 mg Oral BID WC   diclofenac Sodium  4 g Topical QID   insulin aspart  0-20 Units Subcutaneous TID WC   insulin glargine-yfgn  10 Units Subcutaneous Daily   levothyroxine  100 mcg Oral QAC breakfast   pantoprazole  40 mg Oral BID AC   pravastatin  40 mg Oral QHS   pregabalin  100 mg Oral Q8H     LOS: 5 days    Time spent: 35 minutes    Shepard Keltz Darleen Crocker, DO [facility]  If 7PM-7AM, please contact night-coverage [email or web address] 09/06/2021, 11:15 AM

## 2021-09-07 DIAGNOSIS — M1711 Unilateral primary osteoarthritis, right knee: Secondary | ICD-10-CM | POA: Diagnosis not present

## 2021-09-07 DIAGNOSIS — K2900 Acute gastritis without bleeding: Secondary | ICD-10-CM | POA: Diagnosis not present

## 2021-09-07 LAB — MAGNESIUM: Magnesium: 2.1 mg/dL (ref 1.7–2.4)

## 2021-09-07 LAB — CBC
HCT: 30.9 % — ABNORMAL LOW (ref 36.0–46.0)
Hemoglobin: 10.3 g/dL — ABNORMAL LOW (ref 12.0–15.0)
MCH: 28.8 pg (ref 26.0–34.0)
MCHC: 33.3 g/dL (ref 30.0–36.0)
MCV: 86.3 fL (ref 80.0–100.0)
Platelets: 207 10*3/uL (ref 150–400)
RBC: 3.58 MIL/uL — ABNORMAL LOW (ref 3.87–5.11)
RDW: 14.9 % (ref 11.5–15.5)
WBC: 7.8 10*3/uL (ref 4.0–10.5)
nRBC: 0 % (ref 0.0–0.2)

## 2021-09-07 LAB — BASIC METABOLIC PANEL
Anion gap: 8 (ref 5–15)
BUN: 13 mg/dL (ref 8–23)
CO2: 28 mmol/L (ref 22–32)
Calcium: 8 mg/dL — ABNORMAL LOW (ref 8.9–10.3)
Chloride: 105 mmol/L (ref 98–111)
Creatinine, Ser: 1.14 mg/dL — ABNORMAL HIGH (ref 0.44–1.00)
GFR, Estimated: 53 mL/min — ABNORMAL LOW (ref >60)
Glucose, Bld: 140 mg/dL — ABNORMAL HIGH (ref 70–99)
Potassium: 3.6 mmol/L (ref 3.5–5.1)
Sodium: 141 mmol/L (ref 135–145)

## 2021-09-07 LAB — GLUCOSE, CAPILLARY
Glucose-Capillary: 147 mg/dL — ABNORMAL HIGH (ref 70–99)
Glucose-Capillary: 171 mg/dL — ABNORMAL HIGH (ref 70–99)
Glucose-Capillary: 154 mg/dL — ABNORMAL HIGH (ref 70–99)
Glucose-Capillary: 176 mg/dL — ABNORMAL HIGH (ref 70–99)

## 2021-09-07 MED ORDER — TRIPLE ANTIBIOTIC 3.5-400-5000 EX OINT: 1.0000 | TOPICAL_OINTMENT | CUTANEOUS | Status: DC | PRN

## 2021-09-07 MED ORDER — HYDROCORTISONE 0.5 % EX CREA
TOPICAL_CREAM | Freq: Three times a day (TID) | CUTANEOUS | Status: DC
  Filled 2021-09-07: qty 28.35

## 2021-09-07 MED ORDER — HYDROCORTISONE 1 % EX CREA
TOPICAL_CREAM | Freq: Three times a day (TID) | CUTANEOUS | Status: DC
  Filled 2021-09-07: qty 28

## 2021-09-07 NOTE — Consult Note (Signed)
ORTHOPAEDIC CONSULTATION  REQUESTING PHYSICIAN: Heath Lark D, DO  ASSESSMENT AND PLAN: 66 y.o. female with the following: Severe right knee arthritis, stiffness and instability  She has advanced degenerative changes in the right knee, specifically within the medial compartment.  This has been ongoing for several years.  She has developed stiffness, and weakness, especially while being admitted.  She will benefit from physical therapy.  Can try a knee immobilizer, but this will prevent her from bending her knee, which will lead to more stiffness.  Can consider hinged knee brace which can be fitted in clinic.  She has some swelling about the knee, but no redness.  No warmth is appreciated.  Low concern for a septic joint.  - Weight Bearing Status/Activity: Weightbearing as tolerated right lower extremity.  - Additional recommended labs/tests: None  -VTE Prophylaxis: As needed  - Pain control: As needed  - Follow-up plan: As needed  -Procedures: No procedures required  Chief Complaint: Right knee pain  HPI: [name] is a 66 y.o. female with past medical history as listed below, who has been admitted to the hospital for gastritis.  She has had fevers of unknown origin.  Evaluation by the hospitalist, as well as infectious disease team demonstrated some concern for a septic joint.  She has had right knee pain, that has been ongoing for several years.  She notes that she has bone-on-bone arthritis.  She had a steroid injection approximately 4 months ago, with good relief of her symptoms.  She has been unable to return to see that provider for an additional injection.  While in the emergency department, she states that she attempted to stand up, but the knee felt very unstable.  She continues to have pain in the right knee, and is unable to bend the knee without severe pain.  She does have a history of left total knee arthroplasty, completed approximately 20 years ago.  This was done  in [place].  According to her, she was left with a very stiff knee.  Past Medical History:  Diagnosis Date   Asthma    prn neb. and inhaler   Breast cancer ([place]) 01/2013   right   Dehydration 04/13/2013   GERD (gastroesophageal reflux disease)    Graves' disease    Hyperlipidemia    Hypertension    on multiple meds., has been on med. > 14 yr.   Non-insulin dependent type 2 diabetes mellitus ([place])    Obesity    Ophthalmic manifestation of Graves disease    Osteoarthritis 2003   left knee   Radiation 10/11/13-11/29/13   Right Breast   Sleep apnea 4/16   mod   Wears glasses    Past Surgical History:  Procedure Laterality Date   CARDIOVERSION N/A 11/08/2013   Procedure: CARDIOVERSION;  Surgeon: Lelon Perla, MD;  Location: [facility] ENDOSCOPY;  Service: Cardiovascular;  Laterality: N/A;   COLONOSCOPY  2007   COLONOSCOPY W/ POLYPECTOMY  2009   EXCISION OF KELOID N/A 05/18/2014   Procedure: EXCISION OF KELOID;  Surgeon: Erroll Luna, MD;  Location: [place];  Service: General;  Laterality: N/A;   PORT-A-CATH REMOVAL Right 05/18/2014   Procedure: REMOVAL PORT-A-CATH;  Surgeon: Erroll Luna, MD;  Location: [place];  Service: General;  Laterality: Right;   PORTACATH PLACEMENT Right 02/17/2013   Procedure: INSERTION PORT-A-CATH;  Surgeon: Joyice Faster. Cornett, MD;  Location: [place];  Service: General;  Laterality: Right;   TEE WITHOUT CARDIOVERSION  N/A 11/08/2013   Procedure: TRANSESOPHAGEAL ECHOCARDIOGRAM (TEE);  Surgeon: Lelon Perla, MD;  Location: [facility] ENDOSCOPY;  Service: Cardiovascular;  Laterality: N/A;   TOTAL KNEE ARTHROPLASTY Left 2003   TOTAL THYROIDECTOMY     TUBAL LIGATION  1985   Social History   Socioeconomic History   Marital status: Divorced    Spouse name: Not on file   Number of children: 2   Years of education: Not on file   Highest education level: Not on file  Occupational History   Occupation: Disability   Tobacco Use   Smoking status: Former    Packs/day: 0.25    Years: 20.00    Total pack years: 5.00    Types: Cigarettes    Quit date: 02/10/1991    Years since quitting: 30.5    Passive exposure: Never   Smokeless tobacco: Never  Vaping Use   Vaping Use: Never used  Substance and Sexual Activity   Alcohol use: No   Drug use: No   Sexual activity: Never    Birth control/protection: Surgical  Other Topics Concern   Not on file  Social History Narrative   Lives in [place] Determinants of Health   Financial Resource Strain: Low Risk  (04/30/2021)   Overall Financial Resource Strain (CARDIA)    Difficulty of Paying Living Expenses: Not hard at all  Food Insecurity: No Food Insecurity (04/30/2021)   Hunger Vital Sign    Worried About Running Out of Food in the Last Year: Never true    [place] in the Last Year: Never true  Transportation Needs: No Transportation Needs (04/30/2021)   PRAPARE - Hydrologist (Medical): No    Lack of Transportation (Non-Medical): No  Physical Activity: Inactive (04/30/2021)   Exercise Vital Sign    Days of Exercise per Week: 0 days    Minutes of Exercise per Session: 0 min  Stress: No Stress Concern Present (04/30/2021)   [place]    Feeling of Stress : Not at all  Social Connections: Moderately Isolated (04/30/2021)   Social Connection and Isolation Panel [NHANES]    Frequency of Communication with Friends and Family: Once a week    Frequency of Social Gatherings with Friends and Family: More than three times a week    Attends Religious Services: More than 4 times per year    Active Member of [employer] or Organizations: No    Attends Music therapist: Never    Marital Status: Divorced   Family History  Problem Relation Age of Onset   Diabetes Mother    Heart disease Father    Lung cancer Father    Diabetes Sister     Hypertension Sister    Asthma Sister    Rheum arthritis Daughter    Hypertension Daughter    Fibromyalgia Daughter    Hypertension Daughter    Allergies  Allergen Reactions   Procardia [Nifedipine] Other (See Comments)    MIGRAINES   Aspirin     Unable to take due to blood pressure medication   Diltiazem Itching   Prior to Admission medications   Medication Sig Start Date End Date Taking? Authorizing Provider  albuterol (PROVENTIL) (2.5 MG/3ML) 0.083% nebulizer solution USE 1 VIAL IN NEBULIZER EVERY 6 HOURS AS NEEDED FOR WHEEZING OR SHORTNESS OF BREATH. Patient taking differently: Take 2.5 mg by nebulization every 6 (six)  hours as needed for wheezing or shortness of breath. 02/06/21  Yes Cook, Jayce G, DO  albuterol (VENTOLIN HFA) 108 (90 Base) MCG/ACT inhaler INHALE 2 PUFFS INTO THE LUNGS EVERY 6 HOURS AS NEEDED FOR SHORTNESS OF BREATH OR WHEEZING. Patient taking differently: Inhale 2 puffs into the lungs every 6 (six) hours as needed for wheezing or shortness of breath. 01/01/21  Yes Cook, Jayce G, DO  amLODipine (NORVASC) 10 MG tablet TAKE (1) TABLET BY MOUTH ONCE DAILY. Patient taking differently: Take 10 mg by mouth daily. 04/22/21  Yes Cook, Jayce G, DO  benazepril (LOTENSIN) 40 MG tablet TAKE (1) TABLET BY MOUTH ONCE DAILY. Patient taking differently: Take 40 mg by mouth daily. 07/08/21  Yes Cook, Jayce G, DO  carvedilol (COREG) 12.5 MG tablet TAKE (1) TABLET BY MOUTH TWICE DAILY. Patient taking differently: Take 12.5 mg by mouth 2 (two) times daily with a meal. 08/12/21  Yes Cook, Jayce G, DO  Dulaglutide (TRULICITY) 9.41 DE/0.8XK SOPN Inject 0.75 mg into the skin once a week. 02/24/20  Yes [provider]  ELIQUIS 5 MG TABS tablet TAKE (1) TABLET BY MOUTH TWICE DAILY. Patient taking differently: Take 5 mg by mouth 2 (two) times daily. 06/11/21  Yes Cook, Jayce G, DO  fluticasone (FLOVENT HFA) 44 MCG/ACT inhaler Inhale 2 puffs into the lungs 2 (two) times daily. 06/08/18   Yes Mikey Kirschner, MD  HUMALOG MIX 75/25 KWIKPEN (75-25) 100 UNIT/ML Kwikpen Inject 12 Units into the skin in the morning and at bedtime. 09/24/15  Yes [provider]  levothyroxine (SYNTHROID) 100 MCG tablet Take 100 mcg by mouth daily before breakfast.   Yes [provider]  pravastatin (PRAVACHOL) 40 MG tablet TAKE (1) TABLET BY MOUTH AT BEDTIME. Patient taking differently: Take 40 mg by mouth at bedtime. 07/29/21  Yes Cook, Jayce G, DO  pregabalin (LYRICA) 100 MG capsule Take 100 mg by mouth 3 (three) times daily. 04/20/21  Yes [provider]  traMADol (ULTRAM) 50 MG tablet TAKE 1 TABLET EVERY 8 HOURS AS NEEDED FOR MODERATE TO SEVERE PAIN. Patient taking differently: Take 50 mg by mouth every 8 (eight) hours as needed for moderate pain or severe pain. 08/22/21  Yes Coral Spikes, DO  Incontinence Supply Disposable (DEPEND UNDERWEAR X-LARGE) MISC Use daily as needed. 10/31/20   Coral Spikes, DO  Insulin Pen Needle 31G X 8 MM MISC Use to inject insulin one time daily 02/21/13   [provider]  [facility] ULTRA test strip USE TO TEST BLOOD SUGAR 4 TIMES DAILY AS DIRECTED. 06/18/20   Kathyrn Drown, MD  pantoprazole (PROTONIX) 40 MG tablet Take 1 tablet (40 mg total) by mouth 2 (two) times daily before a meal. 09/04/21 10/04/21  Manuella Ghazi, Pratik D, DO  polyethylene glycol (MIRALAX / GLYCOLAX) 17 g packet Take 17 g by mouth daily as needed for mild constipation. 09/04/21   Heath Lark D, DO   DG Knee Right Port  Result Date: 09/06/2021 CLINICAL DATA:  Right-sided knee pain EXAM: PORTABLE RIGHT KNEE - 1-2 VIEW COMPARISON:  05/17/2021 FINDINGS: No fracture or malalignment. Tricompartment arthritis with advanced disease involving the medial tibiofemoral joint space, bone on bone appearance with osteophyte and subarticular irregularly. Trace knee effusion IMPRESSION: Tricompartment arthritis of the knee with advanced medial joint space degenerative changes and small effusion  Electronically Signed   By: Donavan Foil M.D.   On: 09/06/2021 17:55     Family History Reviewed and non-contributory, no pertinent history  of problems with bleeding or anesthesia    Review of Systems +fevers No numbness or tingling No chest pain No shortness of breath No bowel or bladder dysfunction + GI distress No headaches    OBJECTIVE  Vitals:Patient Vitals for the past 8 hrs:  BP Temp Pulse Resp SpO2 Weight  09/07/21 0931 -- -- 79 18 98 % --  09/07/21 0500 130/66 99.1 F (37.3 C) 81 20 97 % (!) 148.7 kg   General: Alert, no acute distress Cardiovascular: Warm extremities noted Respiratory: No cyanosis, no use of accessory musculature GI: No organomegaly, abdomen is soft and non-tender Skin: No lesions in the area of chief complaint other than those listed below in MSK exam.  Neurologic: Sensation intact distally save for the below mentioned MSK exam Psychiatric: Patient is competent for consent with normal mood and affect Lymphatic: No swelling obvious and reported other than the area involved in the exam below Extremities   RLE: Right knee with diffuse general swelling.  No erythema is appreciated.  No induration.  She does not tolerate range of motion of the knee.  Sensation is intact in the right foot.  Toes are warm and well-perfused.     Test Results Imaging X-ray of the right knee demonstrates severe degenerative changes.  Bone-on-bone articulation within the medial compartment.  There is some bone loss, with subchondral sclerosis.  There also appears to be advanced degenerative changes in the patellofemoral compartment.  Varus alignment overall.  Labs cbc Recent Labs    09/05/21 0829 09/07/21 0552  WBC 8.5 7.8  HGB 10.5* 10.3*  HCT 32.2* 30.9*  PLT 168 207    Labs inflam No results for input(s): "CRP" in the last 72 hours.  Invalid input(s): "ESR"  Labs coag No results for input(s): "INR", "PTT" in the last 72 hours.  Invalid input(s):  "PT"  Recent Labs    09/05/21 0829 09/07/21 0552  NA 139 141  K 3.6 3.6  CL 105 105  CO2 24 28  GLUCOSE 180* 140*  BUN 15 13  CREATININE 1.33* 1.14*  CALCIUM 7.8* 8.0*

## 2021-09-07 NOTE — Progress Notes (Addendum)
PROGRESS NOTE    [name]  ZOX:[id number] DOB: 1955-03-30 DOA: 08/31/2021 PCP: Coral Spikes, DO   Brief Narrative:  [name] was admitted to the hospital with the working diagnosis of acute gastritis.   66 yo female with the past medical history of hypertension, T2DM, dyslipidemia, CKD and atrial fibrillation who presented with abdominal pain. Patient reported acute onset of abdominal pain that woke her up from her sleep. Her pain was epigastric and associated with nausea and vomiting. On her initial physical examination her blood pressure was 207/96, HR 106, RR 24 and 02 saturation 92%, lungs were clear to auscultation, heart with S1 and S2 present and rhythmic, abdomen tender to palpation with no rebound or guarding, no lower extremity edema.   She was stable for discharge 8/23 as her gastritis had improved and she was noted to have UTI with E. coli that was adequately treated.  Unfortunately she could not transfer to her vehicle with significant right knee osteoarthritis symptoms.  Therefore she was kept in the hospital for further PT evaluation.  PT now recommending SNF on discharge.  Unfortunately she continues to have some ongoing fevers overnight with unknown origin.  Blood cultures remain negative at this time and procalcitonin low.  Continue to follow CBC.  ID consulted for further evaluation and management and this is pending.  ID requesting MRI of knee and transfer due to persistent fevers and patient wants to go to [facility] as she has been previously hospitalized there and does not want to stay at [facility].  Transfer order has been placed.     Assessment & Plan:   Principal Problem:   Acute gastritis Active Problems:   Acute kidney injury superimposed on chronic kidney disease (HCC)   Type 2 diabetes mellitus with hyperlipidemia (HCC)   Hypertension   Hypothyroidism   OSA (obstructive sleep apnea)   Malignant neoplasm of upper-outer quadrant of right breast  in female, estrogen receptor positive (HCC)   H/O atrial flutter   Class 3 obesity (HCC)  Assessment and Plan:   Acute gastritis-with intermittent symptoms GERD.   Patient with improvement in abdominal pain but not back to baseline, no further nausea or vomiting.    Plan to continue PPI 40 mg twice daily and consider addition of Carafate as needed Continue pain control with as needed morphine and nausea control with zofran Continue IV fluids. [place] negative for cholecystitis, plan to further advance diet at tolerated.  Out of bed to chair tid with meals, Pt and Ot.  -Given a dose of Maalox today and GI plans to follow-up outpatient with EGD -Reconsult GI as needed if symptoms continue to worsen     Acute kidney injury superimposed on chronic kidney disease ([place]) CKD stage 2-resolved   Creatinine improving, continue to hold ACE inhibitor; no further need for IV fluid as patient is tolerating diet Follow up renal function in am, avoid hypotension and nephrotoxic medications.  Advance diet as tolerated.  Good urine output noted   E. coli UTI with ongoing fever of unknown origin Given fosfomycin 8/23 prior to attempted discharge Ongoing fever of up to 102 Fahrenheit overnight Cultures negative thus far and procalcitonin low Monitor CBC Right knee pain from osteoarthritis noted, however knee does not appear septic on exam QuantiFERON TB pending per ID recommendations, right knee x-ray with findings of severe osteoarthritis evaluated by orthopedics with no concern for septic joint, however ID feels that patient should have an MRI for further evaluation  and will follow in person upon transfer to [facility]   Type 2 diabetes mellitus with hyperlipidemia [facility]) Hyperglycemia-improved Patient with improved appetite.  Plan to advance diet as tolerated, continue insulin therapy with sliding scale and resume basal insulin with 10 units, at home on 75/25 12 units bid.     Hypertension Continue blood pressure control with carvedilol, and amlodipine and hold benazepril given AKI   Hypothyroidism Continue with levothyroxine.    OSA (obstructive sleep apnea) Continue Cpap.    Malignant neoplasm of upper-outer quadrant of right breast in female, estrogen receptor positive ([place]) Follow up as outpatient.    H/O atrial flutter Continue anticoagulation with apixaban, patient on carvedilol for rate control.     Class 3 obesity (HCC) Calculated BMI is 54,7    Severe debility with right knee osteoarthritis Patient will require SNF on discharge     DVT prophylaxis: Eliquis Code Status: Full Family Communication: Daughter on phone 8/26 Disposition Plan:  Status is: Inpatient Remains inpatient appropriate because: Need for IV fluid.     Consultants:  GI ID Orthopedics   Procedures:  None   Antimicrobials:  Anti-infectives (From admission, onward)    Start     Dose/Rate Route Frequency Ordered Stop   09/04/21 1000  fosfomycin (MONUROL) packet 3 g        3 g Oral  Once 09/04/21 0905 09/04/21 1115   09/03/21 0800  cefTRIAXone (ROCEPHIN) 1 g in sodium chloride 0.9 % 100 mL IVPB  Status:  Discontinued        1 g 200 mL/hr over 30 Minutes Intravenous Every 24 hours 09/03/21 0647 09/04/21 0910       Subjective: Patient seen and evaluated today with no new acute complaints or concerns. No acute concerns or events noted overnight.  She was noted to have some abdominal pain yesterday, but denies any further pain and is able to tolerate eating this morning.  Continues to have trouble with her right knee.  Objective: Vitals:   09/07/21 0101 09/07/21 0500 09/07/21 0931 09/07/21 1400  BP: 136/63 130/66  (!) 146/75  Pulse: 74 81 79 73  Resp: '20 20 18 18  '$ Temp: 98.7 F (37.1 C) 99.1 F (37.3 C)  98.3 F (36.8 C)  TempSrc: Oral   Oral  SpO2: 96% 97% 98% 100%  Weight:  (!) 148.7 kg    Height:        Intake/Output Summary (Last 24 hours) at  09/07/2021 1518 Last data filed at 09/07/2021 0500 Gross per 24 hour  Intake 240 ml  Output 500 ml  Net -260 ml   Filed Weights   09/05/21 0500 09/06/21 0500 09/07/21 0500  Weight: (!) 148.2 kg (!) 146.2 kg (!) 148.7 kg    Examination:  General exam: Appears calm and comfortable, obese Respiratory system: Clear to auscultation. Respiratory effort normal. Cardiovascular system: S1 & S2 heard, RRR.  Gastrointestinal system: Abdomen is soft Central nervous system: Alert and awake Extremities: No edema Skin: No significant lesions noted Psychiatry: Flat affect.    Data Reviewed: I have personally reviewed following labs and imaging studies  CBC: Recent Labs  Lab 09/02/21 0551 09/03/21 0430 09/04/21 0531 09/05/21 0829 09/07/21 0552  WBC 12.0* 9.9 8.4 8.5 7.8  NEUTROABS 9.2*  --   --   --   --   HGB 11.0* 10.3* 10.4* 10.5* 10.3*  HCT 33.6* 31.7* 31.2* 32.2* 30.9*  MCV 86.4 86.8 85.7 87.3 86.3  PLT 168 142* 164 168 207  Basic Metabolic Panel: Recent Labs  Lab 09/02/21 0551 09/03/21 0430 09/04/21 0531 09/05/21 0829 09/07/21 0552  NA 135 137 137 139 141  K 3.5 3.5 3.6 3.6 3.6  CL 102 105 105 105 105  CO2 '23 24 25 24 28  '$ GLUCOSE 176* 171* 155* 180* 140*  BUN 23 26* '21 15 13  '$ CREATININE 1.78* 1.63* 1.34* 1.33* 1.14*  CALCIUM 7.8* 7.7* 7.9* 7.8* 8.0*  MG  --  1.6* 2.1  --  2.1   GFR: Estimated Creatinine Clearance: 70.3 mL/min (A) (by C-G formula based on SCr of 1.14 mg/dL (H)). Liver Function Tests: No results for input(s): "AST", "ALT", "ALKPHOS", "BILITOT", "PROT", "ALBUMIN" in the last 168 hours. No results for input(s): "LIPASE", "AMYLASE" in the last 168 hours. No results for input(s): "AMMONIA" in the last 168 hours. Coagulation Profile: No results for input(s): "INR", "PROTIME" in the last 168 hours. Cardiac Enzymes: No results for input(s): "CKTOTAL", "CKMB", "CKMBINDEX", "TROPONINI" in the last 168 hours. BNP (last 3 results) No results for  input(s): "PROBNP" in the last 8760 hours. HbA1C: No results for input(s): "HGBA1C" in the last 72 hours. CBG: Recent Labs  Lab 09/06/21 1149 09/06/21 1625 09/06/21 2116 09/07/21 0802 09/07/21 1141  GLUCAP 175* 175* 146* 147* 171*   Lipid Profile: No results for input(s): "CHOL", "HDL", "LDLCALC", "TRIG", "CHOLHDL", "LDLDIRECT" in the last 72 hours. Thyroid Function Tests: No results for input(s): "TSH", "T4TOTAL", "FREET4", "T3FREE", "THYROIDAB" in the last 72 hours. Anemia Panel: No results for input(s): "VITAMINB12", "FOLATE", "FERRITIN", "TIBC", "IRON", "RETICCTPCT" in the last 72 hours. Sepsis Labs: Recent Labs  Lab 09/05/21 0829  PROCALCITON 0.23    Recent Results (from the past 240 hour(s))  Urine Culture     Status: Abnormal   Collection Time: 08/31/21 10:36 AM   Specimen: Urine, Clean Catch  Result Value Ref Range Status   Specimen Description   Final    URINE, CLEAN CATCH Performed at [facility], [address], [place], [place] [address]    Special Requests   Final    NONE Performed at [facility], [address] [place], [place] [address]    Culture >=100,000 COLONIES/mL ESCHERICHIA COLI (A)  Final   Report Status 09/03/2021 FINAL  Final   Organism ID, Bacteria ESCHERICHIA COLI (A)  Final      Susceptibility   Escherichia coli - MIC*    AMPICILLIN >=32 RESISTANT Resistant     CEFAZOLIN <=4 SENSITIVE Sensitive     CEFEPIME <=0.12 SENSITIVE Sensitive     CEFTRIAXONE <=0.25 SENSITIVE Sensitive     CIPROFLOXACIN <=0.25 SENSITIVE Sensitive     GENTAMICIN <=1 SENSITIVE Sensitive     IMIPENEM <=0.25 SENSITIVE Sensitive     NITROFURANTOIN <=16 SENSITIVE Sensitive     TRIMETH/SULFA <=20 SENSITIVE Sensitive     AMPICILLIN/SULBACTAM 16 INTERMEDIATE Intermediate     PIP/TAZO <=4 SENSITIVE Sensitive     * >=100,000 COLONIES/mL ESCHERICHIA COLI  Culture, blood (Routine X 2) w Reflex to ID Panel     Status: None   Collection Time: 09/01/21 12:01 PM   Specimen:  BLOOD LEFT HAND  Result Value Ref Range Status   Specimen Description   Final    BLOOD LEFT HAND BOTTLES DRAWN AEROBIC AND ANAEROBIC   Special Requests   Final    Blood Culture results may not be optimal due to an excessive volume of blood received in culture bottles   Culture   Final    NO GROWTH 5 DAYS  Performed at [facility], [address] [place], [place] [address]    Report Status 09/06/2021 FINAL  Final  Culture, blood (Routine X 2) w Reflex to ID Panel     Status: None   Collection Time: 09/01/21 12:14 PM   Specimen: BLOOD LEFT FOREARM  Result Value Ref Range Status   Specimen Description   Final    BLOOD LEFT FOREARM BOTTLES DRAWN AEROBIC AND ANAEROBIC   Special Requests   Final    Blood Culture results may not be optimal due to an excessive volume of blood received in culture bottles   Culture   Final    NO GROWTH 5 DAYS Performed at [facility], [address] [place], [place] [address]    Report Status 09/06/2021 FINAL  Final  Culture, blood (Routine X 2) w Reflex to ID Panel     Status: None (Preliminary result)   Collection Time: 09/05/21  8:30 AM   Specimen: Left Antecubital; Blood  Result Value Ref Range Status   Specimen Description LEFT ANTECUBITAL  Final   Special Requests   Final    BOTTLES DRAWN AEROBIC AND ANAEROBIC Blood Culture adequate volume   Culture   Final    NO GROWTH < 24 HOURS Performed at [facility], [address] [place], [place] [address]    Report Status PENDING  Incomplete  Culture, blood (Routine X 2) w Reflex to ID Panel     Status: None (Preliminary result)   Collection Time: 09/05/21  8:30 AM   Specimen: BLOOD LEFT ARM  Result Value Ref Range Status   Specimen Description BLOOD LEFT ARM  Final   Special Requests   Final    BOTTLES DRAWN AEROBIC ONLY Blood Culture adequate volume   Culture   Final    NO GROWTH < 24 HOURS Performed at [facility], [address] [place], [place] [address]    Report Status PENDING  Incomplete          Radiology Studies: DG Knee Right Port  Result Date: 09/06/2021 CLINICAL DATA:  Right-sided knee pain EXAM: PORTABLE RIGHT KNEE - 1-2 VIEW COMPARISON:  05/17/2021 FINDINGS: No fracture or malalignment. Tricompartment arthritis with advanced disease involving the medial tibiofemoral joint space, bone on bone appearance with osteophyte and subarticular irregularly. Trace knee effusion IMPRESSION: Tricompartment arthritis of the knee with advanced medial joint space degenerative changes and small effusion Electronically Signed   By: Donavan Foil M.D.   On: 09/06/2021 17:55        Scheduled Meds:  amLODipine  10 mg Oral Daily   apixaban  5 mg Oral BID   budesonide (PULMICORT) nebulizer solution  0.25 mg Nebulization BID   carvedilol  12.5 mg Oral BID WC   diclofenac Sodium  4 g Topical QID   hydrocortisone cream   Topical TID   insulin aspart  0-20 Units Subcutaneous TID WC   insulin glargine-yfgn  10 Units Subcutaneous Daily   levothyroxine  100 mcg Oral QAC breakfast   pantoprazole  40 mg Oral BID AC   pravastatin  40 mg Oral QHS   pregabalin  100 mg Oral Q8H     LOS: 6 days    Time spent: 35 minutes    Adrionna Delcid Darleen Crocker, DO [facility]  If 7PM-7AM, please contact night-coverage [email or web address] 09/07/2021, 3:18 PM

## 2021-09-07 NOTE — TOC Progression Note (Signed)
Transition of Care [facility]) - Progression Note    Patient Details  Name: [name] MRN: [id number] Date of Birth: [birth year]  Transition of Care [facility]) CM/SW Contact  Shade Flood, LCSW Phone Number: 09/07/2021, 1:47 PM  Clinical Narrative:     TOC following. Per MD, pt medically stable for dc. Spoke with Varney Biles at [facility] and was informed that the bed pt was supposed to dc to yesterday was given to another pt since pt was not ready for dc. At this time, they do not have bed for pt until Monday. TOC working on confirming authorization status as well.  Updated MD. Donella Stade will follow.  Expected Discharge Plan: [place] Barriers to Discharge: Continued Medical Work up  Expected Discharge Plan and Services Expected Discharge Plan: [place] Choice: [place] arrangements for the past 2 months: Single Family Home Expected Discharge Date: 09/04/21                                     Social Determinants of Health (SDOH) Interventions    Readmission Risk Interventions     No data to display

## 2021-09-08 ENCOUNTER — Inpatient Hospital Stay (HOSPITAL_COMMUNITY): Payer: Medicare Other

## 2021-09-08 DIAGNOSIS — R7401 Elevation of levels of liver transaminase levels: Secondary | ICD-10-CM

## 2021-09-08 DIAGNOSIS — N189 Chronic kidney disease, unspecified: Secondary | ICD-10-CM | POA: Diagnosis not present

## 2021-09-08 DIAGNOSIS — R509 Fever, unspecified: Secondary | ICD-10-CM

## 2021-09-08 DIAGNOSIS — Z8679 Personal history of other diseases of the circulatory system: Secondary | ICD-10-CM | POA: Diagnosis not present

## 2021-09-08 DIAGNOSIS — N179 Acute kidney failure, unspecified: Secondary | ICD-10-CM | POA: Diagnosis not present

## 2021-09-08 LAB — CBC
HCT: 30.3 % — ABNORMAL LOW (ref 36.0–46.0)
Hemoglobin: 9.9 g/dL — ABNORMAL LOW (ref 12.0–15.0)
MCH: 28.1 pg (ref 26.0–34.0)
MCHC: 32.7 g/dL (ref 30.0–36.0)
MCV: 86.1 fL (ref 80.0–100.0)
Platelets: 235 10*3/uL (ref 150–400)
RBC: 3.52 MIL/uL — ABNORMAL LOW (ref 3.87–5.11)
RDW: 14.8 % (ref 11.5–15.5)
WBC: 6.1 10*3/uL (ref 4.0–10.5)
nRBC: 0 % (ref 0.0–0.2)

## 2021-09-08 LAB — GLUCOSE, CAPILLARY
Glucose-Capillary: 134 mg/dL — ABNORMAL HIGH (ref 70–99)
Glucose-Capillary: 190 mg/dL — ABNORMAL HIGH (ref 70–99)
Glucose-Capillary: 107 mg/dL — ABNORMAL HIGH (ref 70–99)
Glucose-Capillary: 176 mg/dL — ABNORMAL HIGH (ref 70–99)

## 2021-09-08 LAB — COMPREHENSIVE METABOLIC PANEL
ALT: 269 U/L — ABNORMAL HIGH (ref 0–44)
AST: 392 U/L — ABNORMAL HIGH (ref 15–41)
Albumin: 2.7 g/dL — ABNORMAL LOW (ref 3.5–5.0)
Alkaline Phosphatase: 243 U/L — ABNORMAL HIGH (ref 38–126)
Anion gap: 10 (ref 5–15)
BUN: 13 mg/dL (ref 8–23)
CO2: 27 mmol/L (ref 22–32)
Calcium: 8.4 mg/dL — ABNORMAL LOW (ref 8.9–10.3)
Chloride: 105 mmol/L (ref 98–111)
Creatinine, Ser: 1.22 mg/dL — ABNORMAL HIGH (ref 0.44–1.00)
GFR, Estimated: 49 mL/min — ABNORMAL LOW (ref >60)
Glucose, Bld: 128 mg/dL — ABNORMAL HIGH (ref 70–99)
Potassium: 4 mmol/L (ref 3.5–5.1)
Sodium: 142 mmol/L (ref 135–145)
Total Bilirubin: 1.2 mg/dL (ref 0.3–1.2)
Total Protein: 6.8 g/dL (ref 6.5–8.1)

## 2021-09-08 NOTE — Assessment & Plan Note (Signed)
AST and ALT are 392 and 269.  No abdominal pain at all - Stop statin - Repeat ultrasound abdomen - Trend LFTs - Check hepatitis serologies

## 2021-09-08 NOTE — Progress Notes (Signed)
  Progress Note   Patient: [name] RAQ:[id number] DOB: [birth year] DOA: 08/31/2021     7 DOS: the patient was seen and examined on 09/08/2021 at 12:45 PM      Brief hospital course: 66 yo F with DM, MO, OSA, HTN, and AF on ELiquis who presented with vague abdominal pain   Stable for d/c 8/23 but had been bed ridden 3 days so could not transfer to her vehicle due to pain, weakness, brought back in to the hospital for PT eval who recommended SNF>   Subsequently, had daily fevers and so was transferred for ID evaluation.         Assessment and Plan: * Acute kidney injury superimposed on chronic kidney disease ([place]) Creatinine 1.8 on admission, improved to 1.2 with fluids    Fever Patient presented with fever, leukocytosis to 12 K, urine culture growing E. coli.  No antibiotics given initially.  She continued to fever almost daily, given fosfomycin on 8/23. Last fever 102.6 on Aug 25  No fever last 48 hours. - Consult ID - They plan for MRI right knee - Obtain US abdomen again, given transaminitis   Transaminitis AST and ALT are 392 and 269.  No abdominal pain at all - Stop statin - Repeat ultrasound abdomen - Trend LFTs - Check hepatitis serologies  Hypothyroidism - Continue levothyroxine  OSA (obstructive sleep apnea) Continue Cpap.   Malignant neoplasm of upper-outer quadrant of right breast in female, estrogen receptor positive ([place]) Follow up as outpatient.   H/O atrial flutter - Continue Eliquis - Continue carvedilol   Class 3 obesity (HCC) Calculated BMI is 54,7   Type 2 diabetes mellitus with hyperlipidemia (HCC) Had some hypoglycemia earlier in the hospitalization, none today - Continue Semglee - Continue sliding scale corrections  Hypertension - Continue carvedilol, amlodipine - Hold benazepril given AKI          Subjective: Patient feels fine.  No headache, neck pain, chest pain, abdominal pain, nausea, urinary symptoms.  No  cough, sputum.     Physical Exam: Vitals:   09/08/21 0700 09/08/21 0911 09/08/21 1338 09/08/21 1556  BP:  135/64 129/61 133/60  Pulse:  75 77 68  Resp:  18 16   Temp:  98.3 F (36.8 C) 99 F (37.2 C)   TempSrc:  Oral Oral   SpO2:  99% 99%   Weight: (!) 148.1 kg     Height:       Adult female, lying in bed, no acute distress RRR, no murmurs, no peripheral edema Respiratory rate normal, lungs clear without rales or wheezes Abdomen soft without tenderness palpation all quadrants Attention normal, affect normal, judgment insight appear normal  Data Reviewed: Discussed with ID Glucose normal Hemogram shows mild anemia, hemoglobin 9.9, no change Creatinine 1.2, stable from yesterday, sodium potassium normal on BMP LFTs are elevated  Family Communication: Daughters at the bedside    Disposition: Status is: Inpatient         Author: Edwin Dada, MD 09/08/2021 6:01 PM  For on call review [email or web address].

## 2021-09-08 NOTE — TOC Progression Note (Signed)
Transition of Care [facility]) - Progression Note    Patient Details  Name: [name] MRN: [id number] Date of Birth: 14-Nov-1955  Transition of Care [facility]) CM/SW Contact  Ross Ludwig, Aline Phone Number: 09/08/2021, 7:04 PM  Clinical Narrative:     Per Bernadene Bell Portal, patient has been approved for SNF placement reference number [id number], next review 09/10/2021.  CSW to continue to follow patient's progress throughout discharge planning.  Expected Discharge Plan: [place] Barriers to Discharge: Continued Medical Work up  Expected Discharge Plan and Services Expected Discharge Plan: [place] Choice: [place] arrangements for the past 2 months: Single Family Home Expected Discharge Date: 09/04/21                                     Social Determinants of Health (SDOH) Interventions    Readmission Risk Interventions     No data to display

## 2021-09-08 NOTE — Progress Notes (Signed)
[facility] for Infectious Disease  Date of Admission:  08/31/2021   Total days of inpatient antibiotics 0  Principal Problem:   Acute gastritis Active Problems:   Hypertension   Type 2 diabetes mellitus with hyperlipidemia (HCC)   Malignant neoplasm of upper-outer quadrant of right breast in female, estrogen receptor positive ([place])   Hypothyroidism   Acute kidney injury superimposed on chronic kidney disease (HCC)   OSA (obstructive sleep apnea)   Class 3 obesity (HCC)   H/O atrial flutter          Assessment: 66 year old female admitted with abdominal pain thought to be gastritis found to have:   #Fevers-resolved #History of malignant neoplasm of upper and outer right breast #Urine cultures positive for E. coli treated with fosfomycin #Right knee pain -ID was engaged given negative blood cultures and persistent fevers. Xray right knee(per primary no overt signs of septic joint-no erythema). Surgery consulted and low concern for septic joint.  Transferred to [facility] for MRI of right knee/abnormal LFTs.  -Of note she has been followed by Dr. Estanislado Pandy( rheumatology), for chronic left shoulder pain, primary osteoarthritis of right knee.  She has been worked up for primary osteoarthritis of both hands.  Recommended strengthening exercises.  Given that she is followed by rheumatology will recommend follow-up upon discharge.  Patient reports she has had right knee swelling on and off for years.  She reports that her right knee pain and swelling is improving. -Etiology of fever seems to be unclear could be possibly related to rheumatological versus intra-abdominal process versus underlying malignancy.  Recommendations: -Follow up with Rheumatology, Dr. Estanislado Pandy -Follow-up with Oncology outpatient as she has underlying history of lung malignancy in the setting of fever. -Follow-up IGRA -Pt has been afebrile x 48h, continue to hold antibiotics  #Abdominal pain with  transaminitis #Abdominal pain -CT abdomen pelvis showed no acute abdominal/pelvic pathology.  Cholelithiasis without gallbladder wall thickening or biliary duct dilatation.  Right upper quadrant ultrasound showed gallstones and hepatic steatosis.  GI plans to do EGD outpatient. -8/17 LFTs normal, 8/27 392/263/243 AST/ALT/ALP. US showed cholelithiasis without acute cholecystitis Reccs:  -Monitor for LFTs.  Unclear of intra-abdominal process may be leading to fevers. -Agree with Hep panel -Management per primary    Microbiology:   Antibiotics: NA Cultures: Blood 8/24 NG Urine  Other   SUBJECTIVE: Resting in bed not new complaints. Pt reports he right knee get red/swollen and off during the years  Review of Systems: Review of Systems  All other systems reviewed and are negative.    Scheduled Meds:  amLODipine  10 mg Oral Daily   apixaban  5 mg Oral BID   budesonide (PULMICORT) nebulizer solution  0.25 mg Nebulization BID   carvedilol  12.5 mg Oral BID WC   diclofenac Sodium  4 g Topical QID   hydrocortisone cream   Topical TID   insulin aspart  0-20 Units Subcutaneous TID WC   insulin glargine-yfgn  10 Units Subcutaneous Daily   levothyroxine  100 mcg Oral QAC breakfast   pantoprazole  40 mg Oral BID AC   pravastatin  40 mg Oral QHS   pregabalin  100 mg Oral Q8H   Continuous Infusions: PRN Meds:.acetaminophen **OR** acetaminophen, albuterol, albuterol, morphine injection, neomycin-bacitracin-polymyxin, ondansetron **OR** ondansetron (ZOFRAN) IV, oxyCODONE, polyethylene glycol, traZODone Allergies  Allergen Reactions   Procardia [Nifedipine] Other (See Comments)    MIGRAINES   Aspirin     Unable to take due to  blood pressure medication   Diltiazem Itching    OBJECTIVE: Vitals:   09/08/21 0233 09/08/21 0559 09/08/21 0700 09/08/21 0911  BP: 138/73 125/67  135/64  Pulse: 74 73  75  Resp: '20 20  18  '$ Temp: 98.7 F (37.1 C) 98.9 F (37.2 C)  98.3 F (36.8 C)   TempSrc: Oral Oral  Oral  SpO2: 95% 98%  99%  Weight:   (!) 148.1 kg   Height:       Body mass index is 55.18 kg/m.  Physical Exam Constitutional:      Appearance: Normal appearance.  HENT:     Head: Normocephalic and atraumatic.     Right Ear: Tympanic membrane normal.     Left Ear: Tympanic membrane normal.     Nose: Nose normal.     Mouth/Throat:     Mouth: Mucous membranes are moist.  Eyes:     Extraocular Movements: Extraocular movements intact.     Conjunctiva/sclera: Conjunctivae normal.     Pupils: Pupils are equal, round, and reactive to light.  Cardiovascular:     Rate and Rhythm: Normal rate and regular rhythm.     Heart sounds: No murmur heard.    No friction rub. No gallop.  Pulmonary:     Effort: Pulmonary effort is normal.     Breath sounds: Normal breath sounds.  Abdominal:     General: Abdomen is flat.     Palpations: Abdomen is soft.  Musculoskeletal:        General: Normal range of motion.  Skin:    General: Skin is warm and dry.  Neurological:     General: No focal deficit present.     Mental Status: She is alert and oriented to person, place, and time.  Psychiatric:        Mood and Affect: Mood normal.       Lab Results Lab Results  Component Value Date   WBC 6.1 09/08/2021   HGB 9.9 (L) 09/08/2021   HCT 30.3 (L) 09/08/2021   MCV 86.1 09/08/2021   PLT 235 09/08/2021    Lab Results  Component Value Date   CREATININE 1.22 (H) 09/08/2021   BUN 13 09/08/2021   NA 142 09/08/2021   K 4.0 09/08/2021   CL 105 09/08/2021   CO2 27 09/08/2021    Lab Results  Component Value Date   ALT 269 (H) 09/08/2021   AST 392 (H) 09/08/2021   ALKPHOS 243 (H) 09/08/2021   BILITOT 1.2 09/08/2021        Laurice Record, [place] for Infectious Disease [facility] 09/08/2021, 1:15 PM

## 2021-09-08 NOTE — Assessment & Plan Note (Signed)
Patient presented with fever, leukocytosis to 12 K, urine culture growing E. coli.  No antibiotics given initially.  She continued to fever almost daily, given fosfomycin on 8/23. Last fever 102.6 on Aug 25  No fever last 48 hours. - Consult ID - They plan for MRI right knee - Obtain US abdomen again, given transaminitis

## 2021-09-09 ENCOUNTER — Telehealth: Payer: Self-pay | Admitting: Gastroenterology

## 2021-09-09 DIAGNOSIS — Z8619 Personal history of other infectious and parasitic diseases: Secondary | ICD-10-CM | POA: Diagnosis not present

## 2021-09-09 DIAGNOSIS — I10 Essential (primary) hypertension: Secondary | ICD-10-CM | POA: Diagnosis not present

## 2021-09-09 DIAGNOSIS — Z1331 Encounter for screening for depression: Secondary | ICD-10-CM | POA: Diagnosis not present

## 2021-09-09 DIAGNOSIS — M25561 Pain in right knee: Secondary | ICD-10-CM | POA: Diagnosis not present

## 2021-09-09 DIAGNOSIS — E039 Hypothyroidism, unspecified: Secondary | ICD-10-CM | POA: Diagnosis not present

## 2021-09-09 DIAGNOSIS — M170 Bilateral primary osteoarthritis of knee: Secondary | ICD-10-CM | POA: Diagnosis not present

## 2021-09-09 DIAGNOSIS — R7401 Elevation of levels of liver transaminase levels: Secondary | ICD-10-CM | POA: Diagnosis not present

## 2021-09-09 DIAGNOSIS — R5381 Other malaise: Secondary | ICD-10-CM | POA: Diagnosis not present

## 2021-09-09 DIAGNOSIS — N1831 Chronic kidney disease, stage 3a: Secondary | ICD-10-CM | POA: Diagnosis not present

## 2021-09-09 DIAGNOSIS — R2681 Unsteadiness on feet: Secondary | ICD-10-CM | POA: Diagnosis not present

## 2021-09-09 DIAGNOSIS — Z8719 Personal history of other diseases of the digestive system: Secondary | ICD-10-CM | POA: Diagnosis not present

## 2021-09-09 DIAGNOSIS — N189 Chronic kidney disease, unspecified: Secondary | ICD-10-CM | POA: Diagnosis not present

## 2021-09-09 DIAGNOSIS — E890 Postprocedural hypothyroidism: Secondary | ICD-10-CM | POA: Diagnosis not present

## 2021-09-09 DIAGNOSIS — M1990 Unspecified osteoarthritis, unspecified site: Secondary | ICD-10-CM | POA: Diagnosis not present

## 2021-09-09 DIAGNOSIS — Z87440 Personal history of urinary (tract) infections: Secondary | ICD-10-CM | POA: Diagnosis not present

## 2021-09-09 DIAGNOSIS — E1165 Type 2 diabetes mellitus with hyperglycemia: Secondary | ICD-10-CM | POA: Diagnosis not present

## 2021-09-09 DIAGNOSIS — R531 Weakness: Secondary | ICD-10-CM | POA: Diagnosis not present

## 2021-09-09 DIAGNOSIS — E119 Type 2 diabetes mellitus without complications: Secondary | ICD-10-CM | POA: Diagnosis not present

## 2021-09-09 DIAGNOSIS — R41841 Cognitive communication deficit: Secondary | ICD-10-CM | POA: Diagnosis not present

## 2021-09-09 DIAGNOSIS — N179 Acute kidney failure, unspecified: Secondary | ICD-10-CM | POA: Diagnosis not present

## 2021-09-09 DIAGNOSIS — Z7401 Bed confinement status: Secondary | ICD-10-CM | POA: Diagnosis not present

## 2021-09-09 DIAGNOSIS — R278 Other lack of coordination: Secondary | ICD-10-CM | POA: Diagnosis not present

## 2021-09-09 DIAGNOSIS — E1122 Type 2 diabetes mellitus with diabetic chronic kidney disease: Secondary | ICD-10-CM | POA: Diagnosis not present

## 2021-09-09 DIAGNOSIS — A689 Relapsing fever, unspecified: Secondary | ICD-10-CM | POA: Diagnosis not present

## 2021-09-09 DIAGNOSIS — K2900 Acute gastritis without bleeding: Secondary | ICD-10-CM | POA: Diagnosis not present

## 2021-09-09 DIAGNOSIS — M6281 Muscle weakness (generalized): Secondary | ICD-10-CM | POA: Diagnosis not present

## 2021-09-09 DIAGNOSIS — R29898 Other symptoms and signs involving the musculoskeletal system: Secondary | ICD-10-CM | POA: Diagnosis not present

## 2021-09-09 DIAGNOSIS — R262 Difficulty in walking, not elsewhere classified: Secondary | ICD-10-CM | POA: Diagnosis not present

## 2021-09-09 DIAGNOSIS — R2689 Other abnormalities of gait and mobility: Secondary | ICD-10-CM | POA: Diagnosis not present

## 2021-09-09 DIAGNOSIS — M1711 Unilateral primary osteoarthritis, right knee: Secondary | ICD-10-CM | POA: Diagnosis not present

## 2021-09-09 DIAGNOSIS — E7800 Pure hypercholesterolemia, unspecified: Secondary | ICD-10-CM | POA: Diagnosis not present

## 2021-09-09 LAB — GLUCOSE, CAPILLARY
Glucose-Capillary: 159 mg/dL — ABNORMAL HIGH (ref 70–99)
Glucose-Capillary: 174 mg/dL — ABNORMAL HIGH (ref 70–99)
Glucose-Capillary: 175 mg/dL — ABNORMAL HIGH (ref 70–99)

## 2021-09-09 LAB — COMPREHENSIVE METABOLIC PANEL
ALT: 222 U/L — ABNORMAL HIGH (ref 0–44)
AST: 230 U/L — ABNORMAL HIGH (ref 15–41)
Albumin: 2.7 g/dL — ABNORMAL LOW (ref 3.5–5.0)
Alkaline Phosphatase: 242 U/L — ABNORMAL HIGH (ref 38–126)
Anion gap: 8 (ref 5–15)
BUN: 13 mg/dL (ref 8–23)
CO2: 27 mmol/L (ref 22–32)
Calcium: 8.3 mg/dL — ABNORMAL LOW (ref 8.9–10.3)
Chloride: 106 mmol/L (ref 98–111)
Creatinine, Ser: 1.08 mg/dL — ABNORMAL HIGH (ref 0.44–1.00)
GFR, Estimated: 56 mL/min — ABNORMAL LOW (ref >60)
Glucose, Bld: 157 mg/dL — ABNORMAL HIGH (ref 70–99)
Potassium: 3.8 mmol/L (ref 3.5–5.1)
Sodium: 141 mmol/L (ref 135–145)
Total Bilirubin: 0.7 mg/dL (ref 0.3–1.2)
Total Protein: 6.8 g/dL (ref 6.5–8.1)

## 2021-09-09 LAB — PROTIME-INR
INR: 1.6 — ABNORMAL HIGH (ref 0.8–1.2)
Prothrombin Time: 18.7 seconds — ABNORMAL HIGH (ref 11.4–15.2)

## 2021-09-09 LAB — CBC
HCT: 32.1 % — ABNORMAL LOW (ref 36.0–46.0)
Hemoglobin: 10.6 g/dL — ABNORMAL LOW (ref 12.0–15.0)
MCH: 28.6 pg (ref 26.0–34.0)
MCHC: 33 g/dL (ref 30.0–36.0)
MCV: 86.8 fL (ref 80.0–100.0)
Platelets: 269 10*3/uL (ref 150–400)
RBC: 3.7 MIL/uL — ABNORMAL LOW (ref 3.87–5.11)
RDW: 14.9 % (ref 11.5–15.5)
WBC: 5.4 10*3/uL (ref 4.0–10.5)
nRBC: 0 % (ref 0.0–0.2)

## 2021-09-09 LAB — HEPATITIS PANEL, ACUTE
HCV Ab: NONREACTIVE
Hep A IgM: NONREACTIVE
Hep B C IgM: NONREACTIVE
Hepatitis B Surface Ag: NONREACTIVE

## 2021-09-09 MED ORDER — PREGABALIN 100 MG PO CAPS: 100.0000 mg | ORAL_CAPSULE | Freq: Three times a day (TID) | ORAL | 0 refills | Status: DC

## 2021-09-09 MED ORDER — TRAMADOL HCL 50 MG PO TABS: 50.0000 mg | ORAL_TABLET | Freq: Three times a day (TID) | ORAL | 0 refills | Status: DC | PRN

## 2021-09-09 NOTE — Progress Notes (Signed)
Physical Therapy Treatment Patient Details Name: [name] MRN: [id number] DOB: 08-Nov-1955 Today's Date: 09/09/2021  History of Present Illness  [name] is a 66 y.o. female with medical history significant of hypertension, diabetes, hyperlipidemia, asthma, chronic kidney disease, atrial fibrillation, GERD who presented to the emergency room today after awakening at 4 AM with severe abdominal pain.  Initially she thought it was indigestion because she had frequent burping but she had onset of epigastric abdominal pain and pain that was directly under her breast.  Pain is sharp and constant without radiation into her back or chest.  Round 8 AM she developed nausea and multiple episodes of nonbloody emesis and continues to have emesis while in the emergency department during my examination.  She also complains of abdominal pain as described above.  Has had no diarrhea or constipation.  Her last bowel movement was prior to arrival which did not affect her symptoms.  She has had no dysuria no fevers or chills no chest pain or shortness of breath beyond her baseline which she attributes to her asthma.  Took a Zantac prior to arrival to the emergency department but it did not improve her symptoms.      Clinical Impression  Patient last seen on 8/24 for PT at [facility] and significantly limited after extended time in bed. Pt was able to initiate bringing LE's off EOB and raising trunk with significant extra time and ultimately required min assist to raise trunk fully upright. EOB elevated significantly to rise to stand as pt is used to lift chair at home, Mod+2 for power up and to sequence steps to recliner. Pt expressed substantial relief to be OOB and in the recliner stating "I just want to do whatever I need to go home". Currently pt will benefit from ST rehab at [facility] setting, will continue to progress as able.     Recommendations for follow up therapy are one component of a multi-disciplinary  discharge planning process, led by the attending physician.  Recommendations may be updated based on patient status, additional functional criteria and insurance authorization.  Follow Up Recommendations Skilled nursing-short term rehab (<3 hours/day) Can patient physically be transported by private vehicle: No    Assistance Recommended at Discharge Frequent or constant Supervision/Assistance  Patient can return home with the following  Two people to help with walking and/or transfers;Two people to help with bathing/dressing/bathroom;Assistance with cooking/housework;Assist for transportation;Help with stairs or ramp for entrance    Equipment Recommendations None recommended by PT  Recommendations for Other Services       Functional Status Assessment Patient has had a recent decline in their functional status and demonstrates the ability to make significant improvements in function in a reasonable and predictable amount of time.     Precautions / Restrictions Precautions Precautions: Fall Restrictions Weight Bearing Restrictions: No      Mobility  Bed Mobility Overal bed mobility: Needs Assistance Bed Mobility: Supine to Sit     Supine to sit: Min assist, HOB elevated     General bed mobility comments: pt initiated bringing LE's off EOB and using bed rail to initiate raising trunk. Min assist needed to fully raise trunk/head and bed pad to scoot to EOB.    Transfers Overall transfer level: Needs assistance Equipment used: Rolling walker (2 wheels) Transfers: Sit to/from Stand, Bed to chair/wheelchair/BSC Sit to Stand: +2 safety/equipment, +2 physical assistance, From elevated surface, Mod assist   Step pivot transfers: Mod assist, +2 physical assistance, +2 safety/equipment  General transfer comment: Mod +2 to fully rise from elevated EOB, pt taking extra time to raise trunk upright. Assist to steady and direct walker to move towards recliner. Mod assist to steady  balance with weight shift and pt leaning significantly to step each LE.    Ambulation/Gait                  Stairs            Wheelchair Mobility    Modified Rankin (Stroke Patients Only)       Balance Overall balance assessment: Needs assistance Sitting-balance support: Feet supported, No upper extremity supported Sitting balance-Leahy Scale: Fair     Standing balance support: Reliant on assistive device for balance, During functional activity, Bilateral upper extremity supported Standing balance-Leahy Scale: Poor                               Pertinent Vitals/Pain Pain Assessment Pain Assessment: Faces Faces Pain Scale: Hurts even more Pain Location: R knee primarily ; L knee less Pain Descriptors / Indicators: Grimacing, Guarding, Sore Pain Intervention(s): Limited activity within patient's tolerance, Monitored during session, Repositioned    Home Living Family/patient expects to be discharged to:: Private residence Living Arrangements: Children Available Help at Discharge: Family;Available 24 hours/day Type of Home: House Home Access: Ramped entrance       Home Layout: One level Home Equipment: Air cabin crew (4 wheels);Grab bars - tub/shower Additional Comments: Taken via PT note    Prior Function Prior Level of Function : Needs assist       Physical Assist : Mobility (physical);ADLs (physical) Mobility (physical): Bed mobility;Transfers;Gait;Stairs ADLs (physical): Dressing;IADLs Mobility Comments: household ambulation using Rollator ADLs Comments: Pt reports independence with ADL's other than putting socks on. PRN assist for IADL's of cooking.     Hand Dominance   Dominant Hand: Left    Extremity/Trunk Assessment   Upper Extremity Assessment Upper Extremity Assessment: Defer to OT evaluation    Lower Extremity Assessment Lower Extremity Assessment: Overall WFL for tasks assessed;RLE deficits/detail RLE Deficits  / Details: grossly 3/5 (limited by habitus) RLE Sensation: WNL RLE Coordination: WNL LLE Deficits / Details: grossly 3/5 (limited by habitus) LLE Sensation: WNL LLE Coordination: WNL    Cervical / Trunk Assessment Cervical / Trunk Assessment: Lordotic;Other exceptions Cervical / Trunk Exceptions: habitus  Communication   Communication: No difficulties  Cognition Arousal/Alertness: Awake/alert Behavior During Therapy: WFL for tasks assessed/performed Overall Cognitive Status: Within Functional Limits for tasks assessed                                 General Comments: pt emotional and frustrated about how limited she has gotten by being kept in bed for virtually her entire hospitalization so far when she reports she walked into [facility] ~9 days ago when admitted.        General Comments      Exercises     Assessment/Plan    PT Assessment Patient needs continued PT services  PT Problem List Decreased strength;Decreased activity tolerance;Decreased balance;Decreased mobility       PT Treatment Interventions DME instruction;Gait training;Functional mobility training;Therapeutic activities;Therapeutic exercise;Patient/family education;Balance training    PT Goals (Current goals can be found in the Care Plan section)  Acute Rehab PT Goals Patient Stated Goal: return home with family to assist PT Goal Formulation: With patient Time For Goal  Achievement: 09/17/21 Potential to Achieve Goals: Good    Frequency Min 3X/week     Co-evaluation               AM-PAC PT "6 Clicks" Mobility  Outcome Measure Help needed turning from your back to your side while in a flat bed without using bedrails?: A Lot Help needed moving from lying on your back to sitting on the side of a flat bed without using bedrails?: A Little Help needed moving to and from a bed to a chair (including a wheelchair)?: Total Help needed standing up from a chair using your arms (e.g., wheelchair  or bedside chair)?: Total Help needed to walk in hospital room?: Total Help needed climbing 3-5 steps with a railing? : Total 6 Click Score: 9    End of Session Equipment Utilized During Treatment: Gait belt Activity Tolerance: Patient tolerated treatment well;Patient limited by fatigue;Patient limited by pain Patient left: in chair;with call bell/phone within reach;with family/visitor present Nurse Communication: Mobility status PT Visit Diagnosis: Unsteadiness on feet (R26.81);Other abnormalities of gait and mobility (R26.89);Muscle weakness (generalized) (M62.81)    Time: 7915-0413 PT Time Calculation (min) (ACUTE ONLY): 26 min   Charges:     PT Treatments $Therapeutic Activity: 23-37 mins        Verner Mould, DPT Acute Rehabilitation Services Office [phone] Pager [phone]  09/09/21 12:20 PM

## 2021-09-09 NOTE — Telephone Encounter (Signed)
[name]: patient needs outpatient hospital follow-up in next 2 weeks. She is also having LFTs rechecked later this week and nursing facility will fax to [place].

## 2021-09-09 NOTE — Progress Notes (Signed)
Physical Therapy Treatment Patient Details Name: [name] MRN: [id number] DOB: Feb 02, 1955 Today's Date: 09/09/2021   History of Present Illness [name] is a 66 y.o. female with medical history significant of hypertension, diabetes, hyperlipidemia, asthma, chronic kidney disease, atrial fibrillation, GERD who presented to the emergency room today after awakening at 4 AM with severe abdominal pain.  Initially she thought it was indigestion because she had frequent burping but she had onset of epigastric abdominal pain and pain that was directly under her breast.  Pain is sharp and constant without radiation into her back or chest.  Round 8 AM she developed nausea and multiple episodes of nonbloody emesis and continues to have emesis while in the emergency department during my examination.  She also complains of abdominal pain as described above.  Has had no diarrhea or constipation.  Her last bowel movement was prior to arrival which did not affect her symptoms.  She has had no dysuria no fevers or chills no chest pain or shortness of breath beyond her baseline which she attributes to her asthma.  Took a Zantac prior to arrival to the emergency department but it did not improve her symptoms.    PT Comments    Pt required increased assist for power up from low surface of recliner despite elevating chair height with blankets. Max+2 to rise and Rn assisting to steady walker as pt pushing up and leaning on walker to reposition feet as she rose upright. Min-Mod assist to step with RW to EOB from recliner with pt demonstrating improved ability to manage RW. Eos pt returned to supine and resting with call bell, phone, and tray table available. Continue to recommend ST rehab at SNF, will progress as able in hospital stay.       Recommendations for follow up therapy are one component of a multi-disciplinary discharge planning process, led by the attending physician.  Recommendations may be updated  based on patient status, additional functional criteria and insurance authorization.  Follow Up Recommendations  Skilled nursing-short term rehab (<3 hours/day) Can patient physically be transported by private vehicle: No   Assistance Recommended at Discharge Frequent or constant Supervision/Assistance  Patient can return home with the following Two people to help with walking and/or transfers;Two people to help with bathing/dressing/bathroom;Assistance with cooking/housework;Assist for transportation;Help with stairs or ramp for entrance   Equipment Recommendations  None recommended by PT    Recommendations for Other Services       Precautions / Restrictions Precautions Precautions: Fall Restrictions Weight Bearing Restrictions: No     Mobility  Bed Mobility Overal bed mobility: Needs Assistance Bed Mobility: Sit to Supine     Supine to sit: Min assist, HOB elevated Sit to supine: Mod assist   General bed mobility comments: Mod Assist to lift Bil LE's fully onto bed and scoot to reposition/center. pt using bed rail to control lowering trunk.    Transfers Overall transfer level: Needs assistance Equipment used: Rolling walker (2 wheels) Transfers: Sit to/from Stand, Bed to chair/wheelchair/BSC Sit to Stand: +2 safety/equipment, +2 physical assistance, Max assist   Step pivot transfers: +2 physical assistance, +2 safety/equipment, Min assist       General transfer comment: patient required Max +2 for power up from recliner with RN holding RW steady. pt requires support while leaning forward on walker to work feet posteriorly into safe standing position. Min-Mod+2 for stand step transfer to move to EOB and manage RW position.    Ambulation/Gait  Stairs             Wheelchair Mobility    Modified Rankin (Stroke Patients Only)       Balance Overall balance assessment: Needs assistance Sitting-balance support: Feet supported, No  upper extremity supported Sitting balance-Leahy Scale: Fair     Standing balance support: Reliant on assistive device for balance, During functional activity, Bilateral upper extremity supported Standing balance-Leahy Scale: Poor                              Cognition Arousal/Alertness: Awake/alert Behavior During Therapy: WFL for tasks assessed/performed Overall Cognitive Status: Within Functional Limits for tasks assessed                                 General Comments: pt emotional and frustrated about how limited she has gotten by being kept in bed for virtually her entire hospitalization so far when she reports she walked into [facility] ~9 days ago when admitted.        Exercises      General Comments        Pertinent Vitals/Pain Pain Assessment Pain Assessment: Faces Faces Pain Scale: Hurts even more Pain Location: R knee primarily ; L knee less Pain Descriptors / Indicators: Grimacing, Guarding, Sore Pain Intervention(s): Limited activity within patient's tolerance, Monitored during session, Repositioned    Home Living                          Prior Function            PT Goals (current goals can now be found in the care plan section) Acute Rehab PT Goals Patient Stated Goal: return home with family to assist PT Goal Formulation: With patient Time For Goal Achievement: 09/17/21 Potential to Achieve Goals: Good Progress towards PT goals: Progressing toward goals    Frequency    Min 3X/week      PT Plan Current plan remains appropriate    Co-evaluation              AM-PAC PT "6 Clicks" Mobility   Outcome Measure  Help needed turning from your back to your side while in a flat bed without using bedrails?: A Lot Help needed moving from lying on your back to sitting on the side of a flat bed without using bedrails?: A Little Help needed moving to and from a bed to a chair (including a wheelchair)?: Total Help  needed standing up from a chair using your arms (e.g., wheelchair or bedside chair)?: Total Help needed to walk in hospital room?: Total Help needed climbing 3-5 steps with a railing? : Total 6 Click Score: 9    End of Session Equipment Utilized During Treatment: Gait belt Activity Tolerance: Patient tolerated treatment well;Patient limited by fatigue;Patient limited by pain Patient left: in chair;with call bell/phone within reach;with family/visitor present Nurse Communication: Mobility status PT Visit Diagnosis: Unsteadiness on feet (R26.81);Other abnormalities of gait and mobility (R26.89);Muscle weakness (generalized) (M62.81)     Time: 2542-7062 PT Time Calculation (min) (ACUTE ONLY): 25 min  Charges:  $Therapeutic Activity: 23-37 mins                     Verner Mould, DPT Acute Rehabilitation Services Office [phone] Pager [phone]  09/09/21 2:30 PM

## 2021-09-09 NOTE — TOC Transition Note (Signed)
Transition of Care [facility]) - CM/SW Discharge Note   Patient Details  Name: [name] MRN: [id number] Date of Birth: [birth year]  Transition of [facility] CM/SW Contact:  Illene Regulus, LCSW Phone Number: 09/09/2021, 10:27 AM   Clinical Narrative:    CSW spoke with [name] with [facility] , she reported pt's [name] has been approved and the facility has a bed for pt today.   Pt's room assignment room 104 call reported [phone]. CSW will arrange PTAR transport.      Barriers to Discharge: Continued Medical Work up   Patient Goals and CMS Choice Patient states their goals for this hospitalization and ongoing recovery are:: get better CMS Medicare.gov Compare Post Acute Care list provided to:: Patient Choice offered to / list presented to : Patient  Discharge Placement                       Discharge Plan and Services     Post Acute Care Choice: Skilled Nursing Facility                               Social Determinants of Health (SDOH) Interventions     Readmission Risk Interventions     No data to display

## 2021-09-09 NOTE — Progress Notes (Signed)
Called report to the nurse at [facility] place.

## 2021-09-09 NOTE — Care Management Important Message (Signed)
Important Message  Patient Details IM Letter given to the Patient. Name: [name] MRN: [id number] Date of Birth: [birth year]   Medicare Important Message Given:  Yes     Kerin Salen 09/09/2021, 9:55 AM

## 2021-09-09 NOTE — Discharge Summary (Signed)
Physician Discharge Summary   Patient: [name] MRN: [id number] DOB: 09/18/1955  Admit date:     08/31/2021  Discharge date: 09/09/21  Discharge Physician: Edwin Dada   PCP: Coral Spikes, DO     Recommendations at discharge:  [place]: Please check creatinine and LFTs in 3 days and fax to Dr. Hurshel [facility] (see below in follow up section) Schedule follow up with Dr. Abbey Chatters in 2 weeks Follow up with Orthopedics Dr. Marlou Sa for right knee osteoarthritis after discharge from SNF Follow up with PCP Dr. Lacinda Axon 1 week after discharge from SNF If recurrent fever and patient hemodynamically stable: see below      Discharge Diagnoses: Principal Problem:   Acute kidney injury superimposed on chronic kidney disease ([place]) Active Problems:   Fever   Transaminitis   Hypothyroidism   OSA (obstructive sleep apnea)   Malignant neoplasm of upper-outer quadrant of right breast in female, estrogen receptor positive ([place])   H/O atrial flutter   Class 3 obesity ([place])   Hypertension   Type 2 diabetes mellitus with hyperlipidemia [facility])     Hospital Course: Mrs. Lukes was admitted to the hospital with the working diagnosis of acute gastritis.   66 yo female with the past medical history of hypertension, T2DM, dyslipidemia, CKD and atrial fibrillation who presented with abdominal pain. Patient reported acute onset of abdominal pain that woke her up from her sleep. Her pain was epigastric and associated with nausea and vomiting. On her initial physical examination her blood pressure was 207/96, HR 106, RR 24 and 02 saturation 92%, lungs were clear to auscultation, heart with S1 and S2 present and rhythmic, abdomen tender to palpation with no rebound or guarding, no lower extremity edema.   She was stable for discharge 8/23 as her gastritis had improved and she was noted to have UTI with E. coli that was adequately treated.  Unfortunately she could not transfer to her  vehicle with significant right knee osteoarthritis symptoms.  Therefore she was kept in the hospital for further PT evaluation.  PT now recommending SNF on discharge.  Unfortunately she continues to have some ongoing fevers overnight with unknown origin.  Blood cultures remain negative at this time and procalcitonin low.  Continue to follow CBC.  ID consulted for further evaluation and management and this is pending.  ID requesting MRI of knee and transfer due to persistent fevers and patient wants to go to [facility] as she has been previously hospitalized there and does not want to stay at [facility].  Transfer order has been placed.    * Acute kidney injury superimposed on chronic kidney disease (HCC) Creatinine 1.8 on admission, improved to 1.2 with fluids    Fever Transferred from [facility] to [facility] and no further fever for >48 hours now.    Timing of fever resolution occurred right after antibiotics were given for UTI, present on admission.    Bacteremia, endocarditis ruled out with normal cultures, cholecystitis, and cholangitis clinically ruled out and with imaging.     If patient has recurrent fever and is hemodynamically stable:  - Obtain urinalysis/urine culture, CXR, COVID PCR  - Hold antibiotics until source of fever can be found - If no source cllinically evident after clinical history/exam and above studies, and fever persists, recommend Hematology or ID follow up as outpatient for FUO    Transaminitis LFTs normal at admission.  Had few days epigastric pain, but in last several days is tolerating diet fine  and has no complaints of abdominal pain.  AST and ALT found on transfer to be 392/269, trending down today.  Ultrasound abdomen x2 and CT abdomen nromal.  Abdomen exam benign.  Viral hepatitsi ruled out.    Discussed with GI, Roseanne Kaufman, given clinically benign picture and obvious clinical improvement, and difficulty of MRCP given habitus, we will elect to discharge with close LFTs  follow up and possible HIDA after discharge.  Low threshold to MRCP if patient has worsening LFTs or recurrent symptoms.   - Hold statin - Follow with Dr. Abbey Chatters   Knee pain due to osteoarthritis Patient has chronic right knee OA and rigid right leg.  After nursing mobility restrictions during 3 days of evaluation for AKI and gastritis, she became unable to walk.  Clinically and by MRI there is low concern for septic joint, and I feel this is exceedingly unlikely.  Instead, she needs PT and follow up with her orthopedist.           The [facility] Controlled Substances Registry was reviewed for this patient prior to discharge.   Consultants: Gastroenterology, Orthopedics, ID Procedures performed:  - US abdomen - CT abdomen and pelvis with contrast - MRI right knee - US abdomen repeat  Disposition: Skilled nursing facility Diet recommendation:Heart healthy diabetic    DISCHARGE MEDICATION: Allergies as of 09/09/2021       Reactions   Procardia [nifedipine] Other (See Comments)   MIGRAINES   Aspirin    Unable to take due to blood pressure medication   Diltiazem Itching        Medication List     STOP taking these medications    pravastatin 40 MG tablet Commonly known as: PRAVACHOL       TAKE these medications    albuterol 108 (90 Base) MCG/ACT inhaler Commonly known as: Ventolin HFA INHALE 2 PUFFS INTO THE LUNGS EVERY 6 HOURS AS NEEDED FOR SHORTNESS OF BREATH OR WHEEZING. What changed:  how much to take how to take this when to take this reasons to take this additional instructions   albuterol (2.5 MG/3ML) 0.083% nebulizer solution Commonly known as: PROVENTIL USE 1 VIAL IN NEBULIZER EVERY 6 HOURS AS NEEDED FOR WHEEZING OR SHORTNESS OF BREATH. What changed: See the new instructions.   amLODipine 10 MG tablet Commonly known as: NORVASC TAKE (1) TABLET BY MOUTH ONCE DAILY. What changed: See the new instructions.   benazepril 40 MG  tablet Commonly known as: LOTENSIN TAKE (1) TABLET BY MOUTH ONCE DAILY. What changed: See the new instructions.   carvedilol 12.5 MG tablet Commonly known as: COREG TAKE (1) TABLET BY MOUTH TWICE DAILY. What changed: See the new instructions.   Depend Underwear X-Large Misc Use daily as needed.   Eliquis 5 MG Tabs tablet Generic drug: apixaban TAKE (1) TABLET BY MOUTH TWICE DAILY. What changed: See the new instructions.   fluticasone 44 MCG/ACT inhaler Commonly known as: Flovent HFA Inhale 2 puffs into the lungs 2 (two) times daily.   HumaLOG Mix 75/25 KwikPen (75-25) 100 UNIT/ML Kwikpen Generic drug: Insulin Lispro Prot & Lispro Inject 12 Units into the skin in the morning and at bedtime.   Insulin Pen Needle 31G X 8 MM Misc Use to inject insulin one time daily   levothyroxine 100 MCG tablet Commonly known as: SYNTHROID Take 100 mcg by mouth daily before breakfast.   OneTouch Ultra test strip Generic drug: glucose blood USE TO TEST BLOOD SUGAR 4 TIMES DAILY AS DIRECTED.  pantoprazole 40 MG tablet Commonly known as: PROTONIX Take 1 tablet (40 mg total) by mouth 2 (two) times daily before a meal.   polyethylene glycol 17 g packet Commonly known as: MIRALAX / GLYCOLAX Take 17 g by mouth daily as needed for mild constipation.   pregabalin 100 MG capsule Commonly known as: LYRICA Take 1 capsule (100 mg total) by mouth 3 (three) times daily.   traMADol 50 MG tablet Commonly known as: ULTRAM Take 1 tablet (50 mg total) by mouth every 8 (eight) hours as needed for moderate pain or severe pain.   Trulicity 4.62 VO/3.5KK Sopn Generic drug: Dulaglutide Inject 0.75 mg into the skin once a week.        Contact information for follow-up providers     Coral Spikes, DO. Schedule an appointment as soon as possible for a visit in 1 week(s).   Specialty: Family Medicine Contact information: [place] [address] [phone]         Eloise Harman, DO. Schedule an appointment as soon as possible for a visit in 2 week(s).   Specialty: Gastroenterology Contact information: [place] [address] [phone]         Meredith Pel, MD. Schedule an appointment as soon as possible for a visit.   Specialty: Orthopedic Surgery Contact information: [place] [address] [phone]              Contact information for after-discharge care     Destination     [facility] .   Service: Skilled Nursing Contact information: [place] [phone]                     Discharge Instructions     Discharge instructions   Complete by: As directed    From Dr. Loleta Books: You were admitted for abdominal pain Here, we found that this was either from gallstones (also known as biliary colic) or from gastritis.  Follow up with the stomach and intestine doctor, Dr. Abbey Chatters for this (information below in the To Do section) If it was from biliary colic, the treatment would be removal of the gallbladder. To arrange this, Dr. Abbey Chatters would have to refer you to a general surgeon for evaluation for this  If it was from gastritis, the treatment would be an acid suppressant, like Protonix (which is similar to Prilosec) Take Protonix/pantoprazole twice daily for the next few weeks and let Dr. Abbey Chatters know if it helps If it does not do anything, stop it    Also, your knee was hurting Do PT for this at [facility] Then call Dr. Marlou Sa for an appointment as soon as you are out of [place]   Lastly, have [place] check your liver function tests in 3 days and fax them to Dr. Abbey Chatters When you see Dr. Abbey Chatters in 2 weeks, ask him about this follow up Until your liver function tests are normal, do not take pravastatin   Increase activity slowly   Complete by: As directed        Discharge Exam: Filed Weights   09/07/21 0500 09/08/21  0700 09/09/21 0538  Weight: (!) 148.7 kg (!) 148.1 kg (!) 145.9 kg    General: Pt is alert, awake, not in acute distress Cardiovascular: RRR, nl S1-S2, no murmurs appreciated.   No LE edema.   Respiratory: Normal respiratory rate and rhythm.  CTAB without rales  or wheezes. Abdominal: Abdomen soft and non-tender.  No distension or HSM.   MSK: THe right knee has no redness or effusion, the ROM is limited by pain and crepitus but not the the extent to suggest synovitis or septic joint.  Left leg with old knee scar, has very limited chronic ROM. Neuro/Psych: Strength symmetric in upper and lower extremities.  Judgment and insight appear normal.   Condition at discharge: good  The results of significant diagnostics from this hospitalization (including imaging, microbiology, ancillary and laboratory) are listed below for reference.   Imaging Studies: US Abdomen Complete  Result Date: 09/08/2021 CLINICAL DATA:  66 year old female with elevated LFTs. EXAM: ABDOMEN ULTRASOUND COMPLETE COMPARISON:  09/01/2021 ultrasound and 08/31/2021 CT FINDINGS: Gallbladder: Cholelithiasis again noted with the largest gallstone measuring 13 mm. There is no evidence of gallbladder wall thickening, pericholecystic fluid or sonographic Murphy sign. Common bile duct: Diameter: 5 mm. Liver: Increased hepatic echogenicity noted. No other definite hepatic abnormality noted. Portal vein is patent on color Doppler imaging with normal direction of blood flow towards the liver. IVC: No abnormality visualized. Pancreas: Visualized portion unremarkable. Spleen: Size and appearance within normal limits. Right Kidney: Length: 10.5 cm. Echogenicity within normal limits. No mass or hydronephrosis visualized. Left Kidney: Length: 11.1 cm. Echogenicity within normal limits. A 2.4 cm cyst is present. No solid mass or hydronephrosis visualized. Abdominal aorta: No aneurysm visualized. Other findings: None. IMPRESSION: 1. Cholelithiasis  without sonographic evidence of acute cholecystitis. No biliary dilatation. 2. Hepatic steatosis. 3. No other significant abnormality. Electronically Signed   By: Margarette Canada M.D.   On: 09/08/2021 17:53   MR KNEE RIGHT WO CONTRAST  Result Date: 09/08/2021 CLINICAL DATA:  Clinical suspicion for septic arthritis EXAM: MRI OF THE RIGHT KNEE WITHOUT CONTRAST TECHNIQUE: Multiplanar, multisequence MR imaging of the knee was performed. No intravenous contrast was administered. COMPARISON:  X-ray 09/06/2021 FINDINGS: Technical note: Incomplete examination. Patient was unable to complete the study secondary to severe pain. Only 4 sequences were able to be obtained, all of which are degraded by motion artifact. Limited interpretation as follows: Bones/Joint/Cartilage No evidence of acute fracture or dislocation. Tricompartmental osteoarthritis, severe within the medial compartment with high-grade cartilage loss and bulky marginal osteophytes. Subchondral marrow edema within the periphery of the medial femoral condyle and medial tibial plateau are likely degenerative/reactive. Moderate-sized knee joint effusion. Ligaments Proximal MCL is thickened with periligamentous edema. MCL bowed by medial compartment osteophytes. Lateral collateral ligament complex grossly intact. Cruciate ligaments suboptimally assessed. Muscles and Tendons No acute musculotendinous abnormality on limited sequences. Soft tissues Small Baker's cyst.  No periarticular soft tissue edema. IMPRESSION: 1. Significantly limited exam.  See above discussion. 2. Tricompartmental osteoarthritis, severe within the medial compartment. 3. Moderate-sized knee joint effusion, favored reactive secondary to underlying osteoarthritis. Septic arthritis is felt to be less likely given lack of inflammatory changes in the periarticular soft tissues. If there is high clinical suspicion for septic arthritis, arthrocentesis should be obtained. 4. Small Baker's cyst. 5. Grade  1 MCL sprain. Electronically Signed   By: Davina Poke D.O.   On: 09/08/2021 15:30   DG Knee Right Port  Result Date: 09/06/2021 CLINICAL DATA:  Right-sided knee pain EXAM: PORTABLE RIGHT KNEE - 1-2 VIEW COMPARISON:  05/17/2021 FINDINGS: No fracture or malalignment. Tricompartment arthritis with advanced disease involving the medial tibiofemoral joint space, bone on bone appearance with osteophyte and subarticular irregularly. Trace knee effusion IMPRESSION: Tricompartment arthritis of the knee with advanced medial joint space degenerative changes and  small effusion Electronically Signed   By: Donavan Foil M.D.   On: 09/06/2021 17:55   US Abdomen Limited RUQ (LIVER/GB)  Result Date: 09/01/2021 CLINICAL DATA:  Epigastric pain EXAM: ULTRASOUND ABDOMEN LIMITED RIGHT UPPER QUADRANT COMPARISON:  CT [facility] 08/31/2021 FINDINGS: Gallbladder: Gallstones noted measuring up to 1.9 cm. No gallbladder wall thickening, pericholecystic fluid or sonographic Murphy's sign. Common bile duct: Diameter: 4.2 mm.  No intrahepatic bile duct dilatation. Liver: No focal lesion identified. Increased parenchymal echogenicity suggesting hepatic steatosis. Portal vein is patent on color Doppler imaging with normal direction of blood flow towards the liver. Other: Exam detail is diminished secondary to patient body habitus. IMPRESSION: 1. Gallstones.  There are no secondary signs of acute cholecystitis. 2. Hepatic steatosis. Electronically Signed   By: Kerby Moors M.D.   On: 09/01/2021 10:47   CT ABDOMEN PELVIS W CONTRAST  Result Date: 08/31/2021 CLINICAL DATA:  Epigastric pain. EXAM: CT ABDOMEN AND PELVIS WITH CONTRAST TECHNIQUE: Multidetector CT imaging of the abdomen and pelvis was performed using the standard protocol following bolus administration of intravenous contrast. RADIATION DOSE REDUCTION: This exam was performed according to the departmental dose-optimization program which includes automated exposure control,  adjustment of the mA and/or kV according to patient size and/or use of iterative reconstruction technique. CONTRAST:  141m OMNIPAQUE IOHEXOL 300 MG/ML  SOLN COMPARISON:  02/25/2013 FINDINGS: Lower chest: No acute abnormality. Hepatobiliary: No focal liver abnormality is seen. Cholelithiasis without gallbladder wall thickening. No biliary ductal dilatation. Pancreas: Unremarkable. No pancreatic ductal dilatation or surrounding inflammatory changes. Spleen: Normal in size without focal abnormality. Adrenals/Urinary Tract: Adrenal glands are unremarkable. Kidneys are normal, without renal calculi, focal lesion, or hydronephrosis. Bladder is unremarkable. Stomach/Bowel: Stomach is within normal limits. No evidence of bowel wall thickening, distention, or inflammatory changes. Appendix is normal. Vascular/Lymphatic: Normal caliber abdominal aorta with mild atherosclerosis. No lymphadenopathy. Reproductive: Small calcified left uterine fibroid. Otherwise uterus and bilateral adnexa are unremarkable. Other: No abdominal wall hernia or abnormality. No abdominopelvic ascites. Musculoskeletal: No acute osseous abnormality. No aggressive osseous lesion. Mild osteoarthritis of bilateral SI joints. Degenerative disease with disc height loss at L5-S1. Moderate-severe bilateral facet arthropathy at L4-5 and L5-S1. IMPRESSION: 1. No acute abdominal or pelvic pathology. 2. Cholelithiasis without gallbladder wall thickening or biliary ductal dilatation. 3. Aortic Atherosclerosis (ICD10-I70.0). Electronically Signed   By: HKathreen DevoidM.D.   On: 08/31/2021 12:03    Microbiology: Results for orders placed or performed during the hospital encounter of 08/31/21  Urine Culture     Status: Abnormal   Collection Time: 08/31/21 10:36 AM   Specimen: Urine, Clean Catch  Result Value Ref Range Status   Specimen Description   Final    URINE, CLEAN CATCH [facility] [address] [place] [address]   Special  Requests   Final    NONE [facility]formed at [facility] [address], [place] [address]   Culture >=100,000 COLONIES/mL ESCHERICHIA COLI (A)  Final   Report Status 09/03/2021 FINAL  Final   Organism ID, Bacteria ESCHERICHIA COLI (A)  Final      Susceptibility   Escherichia coli - MIC*    AMPICILLIN >=32 RESISTANT Resistant     CEFAZOLIN <=4 SENSITIVE Sensitive     CEFEPIME <=0.12 SENSITIVE Sensitive     CEFTRIAXONE <=0.25 SENSITIVE Sensitive     CIPROFLOXACIN <=0.25 SENSITIVE Sensitive     GENTAMICIN <=1 SENSITIVE Sensitive     IMIPENEM <=0.25 SENSITIVE Sensitive  NITROFURANTOIN <=16 SENSITIVE Sensitive     TRIMETH/SULFA <=20 SENSITIVE Sensitive     AMPICILLIN/SULBACTAM 16 INTERMEDIATE Intermediate     PIP/TAZO <=4 SENSITIVE Sensitive     * >=100,000 COLONIES/mL ESCHERICHIA COLI  Culture, blood (Routine X 2) w Reflex to ID Panel     Status: None   Collection Time: 09/01/21 12:01 PM   Specimen: BLOOD LEFT HAND  Result Value Ref Range Status   Specimen Description   Final    BLOOD LEFT HAND BOTTLES DRAWN AEROBIC AND ANAEROBIC   Special Requests   Final    Blood Culture results may not be optimal due to an excessive volume of blood received in culture bottles   Culture   Final    NO GROWTH 5 DAYS Performed at [facility], [address] [place], [place] [address]    Report Status 09/06/2021 FINAL  Final  Culture, blood (Routine X 2) w Reflex to ID Panel     Status: None   Collection Time: 09/01/21 12:14 PM   Specimen: BLOOD LEFT FOREARM  Result Value Ref Range Status   Specimen Description   Final    BLOOD LEFT FOREARM BOTTLES DRAWN AEROBIC AND ANAEROBIC   Special Requests   Final    Blood Culture results may not be optimal due to an excessive volume of blood received in culture bottles   Culture   Final    NO GROWTH 5 DAYS Performed at [facility], [address] [place], [place] [address]    Report Status 09/06/2021 FINAL  Final  Culture, blood (Routine X 2)  w Reflex to ID Panel     Status: None (Preliminary result)   Collection Time: 09/05/21  8:30 AM   Specimen: Left Antecubital; Blood  Result Value Ref Range Status   Specimen Description LEFT ANTECUBITAL  Final   Special Requests   Final    BOTTLES DRAWN AEROBIC AND ANAEROBIC Blood Culture adequate volume   Culture   Final    NO GROWTH 3 DAYS Performed at [facility], [address] [place], [place] [address]    Report Status PENDING  Incomplete  Culture, blood (Routine X 2) w Reflex to ID Panel     Status: None (Preliminary result)   Collection Time: 09/05/21  8:30 AM   Specimen: BLOOD LEFT ARM  Result Value Ref Range Status   Specimen Description BLOOD LEFT ARM  Final   Special Requests   Final    BOTTLES DRAWN AEROBIC ONLY Blood Culture adequate volume   Culture   Final    NO GROWTH 3 DAYS Performed at [facility], [address] [place], [place] [address]    Report Status PENDING  Incomplete    Labs: CBC: Recent Labs  Lab 09/04/21 0531 09/05/21 0829 09/07/21 0552 09/08/21 0612 09/09/21 0523  WBC 8.4 8.5 7.8 6.1 5.4  HGB 10.4* 10.5* 10.3* 9.9* 10.6*  HCT 31.2* 32.2* 30.9* 30.3* 32.1*  MCV 85.7 87.3 86.3 86.1 86.8  PLT 164 168 207 235 027   Basic Metabolic Panel: Recent Labs  Lab 09/03/21 0430 09/04/21 0531 09/05/21 0829 09/07/21 0552 09/08/21 0612 09/09/21 0523  NA 137 137 139 141 142 141  K 3.5 3.6 3.6 3.6 4.0 3.8  CL 105 105 105 105 105 106  CO2 '24 25 24 28 27 27  '$ GLUCOSE 171* 155* 180* 140* 128* 157*  BUN 26* '21 15 13 13 13  '$ CREATININE 1.63* 1.34* 1.33* 1.14* 1.22* 1.08*  CALCIUM 7.7* 7.9* 7.8*  8.0* 8.4* 8.3*  MG 1.6* 2.1  --  2.1  --   --    Liver Function Tests: Recent Labs  Lab 09/08/21 0612 09/09/21 0523  AST 392* 230*  ALT 269* 222*  ALKPHOS 243* 242*  BILITOT 1.2 0.7  PROT 6.8 6.8  ALBUMIN 2.7* 2.7*   CBG: Recent Labs  Lab 09/08/21 1119 09/08/21 1617 09/08/21 2208 09/09/21 0730 09/09/21 1232  GLUCAP 190* 107* 176* 159* 174*     Discharge time spent: approximately 35 minutes spent on discharge counseling, evaluation of patient on day of discharge, and coordination of discharge planning with nursing, social work, pharmacy and case management  Signed: Edwin Dada, MD [facility] 09/09/2021

## 2021-09-10 ENCOUNTER — Telehealth: Payer: Self-pay | Admitting: Nurse Practitioner

## 2021-09-10 DIAGNOSIS — M1711 Unilateral primary osteoarthritis, right knee: Secondary | ICD-10-CM | POA: Diagnosis not present

## 2021-09-10 DIAGNOSIS — K2900 Acute gastritis without bleeding: Secondary | ICD-10-CM | POA: Diagnosis not present

## 2021-09-10 DIAGNOSIS — Z8619 Personal history of other infectious and parasitic diseases: Secondary | ICD-10-CM | POA: Diagnosis not present

## 2021-09-10 DIAGNOSIS — Z87440 Personal history of urinary (tract) infections: Secondary | ICD-10-CM | POA: Diagnosis not present

## 2021-09-10 LAB — CULTURE, BLOOD (ROUTINE X 2)
Culture: NO GROWTH
Culture: NO GROWTH
Special Requests: ADEQUATE
Special Requests: ADEQUATE

## 2021-09-10 NOTE — Telephone Encounter (Signed)
Darnelle Bos from [employer] calling to get new script for CPAP. Script should say: CPAP REPLACEMENT  SETTING OF 10CM WITH HEATED HUMIDIFIER  SUPPLIES LON  DX NPI OF PROVIDER Jeneen Rinks also request that the provider name is to be circled at top of script if we write it out.  Please fax directly to [facility] at [phone]. Please advise. Thank you

## 2021-09-11 ENCOUNTER — Telehealth: Payer: Self-pay | Admitting: *Deleted

## 2021-09-11 DIAGNOSIS — M6281 Muscle weakness (generalized): Secondary | ICD-10-CM | POA: Diagnosis not present

## 2021-09-11 DIAGNOSIS — M1990 Unspecified osteoarthritis, unspecified site: Secondary | ICD-10-CM | POA: Diagnosis not present

## 2021-09-11 DIAGNOSIS — R2681 Unsteadiness on feet: Secondary | ICD-10-CM | POA: Diagnosis not present

## 2021-09-11 DIAGNOSIS — R5381 Other malaise: Secondary | ICD-10-CM | POA: Diagnosis not present

## 2021-09-11 NOTE — Patient Outreach (Signed)
  Care Coordination   09/11/2021 Name: BRIGETT ESTELL MRN: [id number] DOB: Mar 07, 1955   Care Coordination Outreach Attempts:  An unsuccessful telephone outreach was attempted today to offer the patient information about available care coordination services as a benefit of their health plan.   Follow Up Plan:  Additional outreach attempts will be made to offer the patient care coordination information and services.   Encounter Outcome:  No Answer  Care Coordination Interventions Activated:  Yes   Care Coordination Interventions:  No, not indicated    [facility] [phone]

## 2021-09-11 NOTE — Telephone Encounter (Signed)
Script written out and provider signed. Script faxed to provided fax number

## 2021-09-12 DIAGNOSIS — M1990 Unspecified osteoarthritis, unspecified site: Secondary | ICD-10-CM | POA: Diagnosis not present

## 2021-09-12 DIAGNOSIS — M6281 Muscle weakness (generalized): Secondary | ICD-10-CM | POA: Diagnosis not present

## 2021-09-12 DIAGNOSIS — R2681 Unsteadiness on feet: Secondary | ICD-10-CM | POA: Diagnosis not present

## 2021-09-12 DIAGNOSIS — R5381 Other malaise: Secondary | ICD-10-CM | POA: Diagnosis not present

## 2021-09-13 DIAGNOSIS — Z8719 Personal history of other diseases of the digestive system: Secondary | ICD-10-CM | POA: Diagnosis not present

## 2021-09-13 DIAGNOSIS — R7401 Elevation of levels of liver transaminase levels: Secondary | ICD-10-CM | POA: Diagnosis not present

## 2021-09-13 DIAGNOSIS — E1122 Type 2 diabetes mellitus with diabetic chronic kidney disease: Secondary | ICD-10-CM | POA: Diagnosis not present

## 2021-09-13 DIAGNOSIS — N1831 Chronic kidney disease, stage 3a: Secondary | ICD-10-CM | POA: Diagnosis not present

## 2021-09-13 LAB — QUANTIFERON-TB GOLD PLUS (RQFGPL)
QuantiFERON Mitogen Value: 5.12 IU/mL
QuantiFERON Nil Value: 0.04 IU/mL
QuantiFERON TB1 Ag Value: 0.03 IU/mL
QuantiFERON TB2 Ag Value: 0.05 IU/mL

## 2021-09-13 LAB — QUANTIFERON-TB GOLD PLUS: QuantiFERON-TB Gold Plus: NEGATIVE

## 2021-09-17 DIAGNOSIS — M1990 Unspecified osteoarthritis, unspecified site: Secondary | ICD-10-CM | POA: Diagnosis not present

## 2021-09-17 DIAGNOSIS — A689 Relapsing fever, unspecified: Secondary | ICD-10-CM | POA: Diagnosis not present

## 2021-09-17 DIAGNOSIS — Z1331 Encounter for screening for depression: Secondary | ICD-10-CM | POA: Diagnosis not present

## 2021-09-17 DIAGNOSIS — M1711 Unilateral primary osteoarthritis, right knee: Secondary | ICD-10-CM | POA: Diagnosis not present

## 2021-09-17 DIAGNOSIS — R5381 Other malaise: Secondary | ICD-10-CM | POA: Diagnosis not present

## 2021-09-17 DIAGNOSIS — R2681 Unsteadiness on feet: Secondary | ICD-10-CM | POA: Diagnosis not present

## 2021-09-17 DIAGNOSIS — E1122 Type 2 diabetes mellitus with diabetic chronic kidney disease: Secondary | ICD-10-CM | POA: Diagnosis not present

## 2021-09-17 DIAGNOSIS — M6281 Muscle weakness (generalized): Secondary | ICD-10-CM | POA: Diagnosis not present

## 2021-09-18 ENCOUNTER — Telehealth: Payer: Self-pay

## 2021-09-18 ENCOUNTER — Ambulatory Visit: Payer: Medicare Other | Admitting: Orthopedic Surgery

## 2021-09-18 DIAGNOSIS — M170 Bilateral primary osteoarthritis of knee: Secondary | ICD-10-CM

## 2021-09-18 DIAGNOSIS — M25561 Pain in right knee: Secondary | ICD-10-CM

## 2021-09-18 NOTE — Telephone Encounter (Signed)
Auth needed for right knee gel injection  

## 2021-09-19 ENCOUNTER — Encounter: Payer: Self-pay | Admitting: Orthopedic Surgery

## 2021-09-19 DIAGNOSIS — R5381 Other malaise: Secondary | ICD-10-CM | POA: Diagnosis not present

## 2021-09-19 DIAGNOSIS — M6281 Muscle weakness (generalized): Secondary | ICD-10-CM | POA: Diagnosis not present

## 2021-09-19 DIAGNOSIS — R2681 Unsteadiness on feet: Secondary | ICD-10-CM | POA: Diagnosis not present

## 2021-09-19 DIAGNOSIS — M1990 Unspecified osteoarthritis, unspecified site: Secondary | ICD-10-CM | POA: Diagnosis not present

## 2021-09-19 MED ORDER — LIDOCAINE HCL 1 % IJ SOLN
5.0000 mL | INTRAMUSCULAR | Status: AC | PRN
  Administered 2021-09-18: 5 mL

## 2021-09-19 MED ORDER — BUPIVACAINE HCL 0.25 % IJ SOLN
4.0000 mL | INTRAMUSCULAR | Status: AC | PRN
  Administered 2021-09-18: 4 mL via INTRA_ARTICULAR

## 2021-09-19 MED ORDER — METHYLPREDNISOLONE ACETATE 40 MG/ML IJ SUSP
40.0000 mg | INTRAMUSCULAR | Status: AC | PRN
  Administered 2021-09-18: 40 mg via INTRA_ARTICULAR

## 2021-09-19 NOTE — Progress Notes (Signed)
Office Visit Note   Patient: [name]           Date of Birth: 12/27/1955           MRN: [id number] Visit Date: 09/18/2021 Requested by: Coral Spikes, DO [place],  [place] [id number] PCP: Coral Spikes, DO  Subjective: Chief Complaint  Patient presents with   Right Knee - Pain    HPI: [name] is a 66 year old patient with bilateral knee arthritis.  She reports right knee pain.  She is here for follow-up from the emergency department where there is question whether or not she had some type of infection around her knee.  She was in the hospital for other medical issues.  She was laying in the bed for a long time and when she finally got up her right leg gave way.  She is working with physical therapy on strengthening.  She currently is in rehab.  MRI in the hospital demonstrated effusion and significant arthritis in the knee but no acute fracture.  She denies any fevers or chills and currently is not taking antibiotics.              ROS: All systems reviewed are negative as they relate to the chief complaint within the history of present illness.  Patient denies  fevers or chills.   Assessment & Plan: Visit Diagnoses:  1. Primary osteoarthritis of both knees     Plan: Impression is right knee pain from arthritis and a period of deconditioning from being in the hospital.  Plan is to continue physical therapy.  Aspiration injection of the right knee is performed today.  Okay for weightbearing as tolerated with therapy assist.  Gel injections may be helpful in this case as well. This patient is diagnosed with osteoarthritis of the knee(s).    Radiographs show evidence of joint space narrowing, osteophytes, subchondral sclerosis and/or subchondral cysts.  This patient has knee pain which interferes with functional and activities of daily living.    This patient has experienced inadequate response, adverse effects and/or intolerance with conservative treatments such  as acetaminophen, NSAIDS, topical creams, physical therapy or regular exercise, knee bracing and/or weight loss.   This patient has experienced inadequate response or has a contraindication to intra articular steroid injections for at least 3 months.   This patient is not scheduled to have a total knee replacement within 6 months of starting treatment with viscosupplementation.   Follow-Up Instructions: No follow-ups on file.   Orders:  No orders of the defined types were placed in this encounter.  No orders of the defined types were placed in this encounter.     Procedures: Large Joint Inj: R knee on 09/18/2021 8:29 AM Indications: diagnostic evaluation, joint swelling and pain Details: 18 G 1.5 in needle, superolateral approach  Arthrogram: No  Medications: 5 mL lidocaine 1 %; 40 mg methylPREDNISolone acetate 40 MG/ML; 4 mL bupivacaine 0.25 % Outcome: tolerated well, no immediate complications Procedure, treatment alternatives, risks and benefits explained, specific risks discussed. Consent was given by the patient. Immediately prior to procedure a time out was called to verify the correct patient, procedure, equipment, support staff and site/side marked as required. Patient was prepped and draped in the usual sterile fashion.       Clinical Data: No additional findings.  Objective: Vital Signs: There were no vitals taken for this visit.  Physical Exam:   Constitutional: Patient appears well-developed HEENT:  Head: Normocephalic Eyes:EOM  are normal Neck: Normal range of motion Cardiovascular: Normal rate Pulmonary/chest: Effort normal Neurologic: Patient is alert Skin: Skin is warm Psychiatric: Patient has normal mood and affect   Ortho Exam: Ortho exam demonstrates palpable pedal pulses with mild effusion right knee no effusion left knee.  Extensor mechanism intact bilaterally and she can do a straight leg raise.  No groin pain with internal/external rotation of  the leg.  Right knee range of motion somewhat limited to 5 degrees from full extension to about 60 of flexion.  No groin pain with internal or external rotation of either leg.  Specialty Comments:  No specialty comments available.  Imaging: No results found.   PMFS History: Patient Active Problem List   Diagnosis Date Noted   Fever 09/08/2021   Transaminitis 09/08/2021   Class 3 obesity ([place]) 09/01/2021   H/O atrial flutter 09/01/2021   Diabetic neuropathy ([place]) 04/30/2021   Arthritis, multiple joint involvement 10/31/2020   Healthcare maintenance 10/31/2020   Depression 05/24/2015   OSA (obstructive sleep apnea) 04/04/2014   Mitral regurgitation 11/08/2013   Atrial flutter ([place]) 11/05/2013   Acute kidney injury superimposed on chronic kidney disease ([place]) 05/08/2013   Hypothyroidism 05/04/2013   Malignant neoplasm of upper-outer quadrant of right breast in female, estrogen receptor positive ([place]) 01/31/2013   Asthma 12/08/2010   Hypertension    Type 2 diabetes mellitus with hyperlipidemia ([place])    Tobacco abuse, in remission    Past Medical History:  Diagnosis Date   Asthma    prn neb. and inhaler   Breast cancer ([place]) 01/2013   right   Dehydration 04/13/2013   GERD (gastroesophageal reflux disease)    Graves' disease    Hyperlipidemia    Hypertension    on multiple meds., has been on med. > 14 yr.   Non-insulin dependent type 2 diabetes mellitus ([place])    Obesity    Ophthalmic manifestation of Graves disease    Osteoarthritis 2003   left knee   Radiation 10/11/13-11/29/13   Right Breast   Sleep apnea 4/16   mod   Wears glasses     Family History  Problem Relation Age of Onset   Diabetes Mother    Heart disease Father    Lung cancer Father    Diabetes Sister    Hypertension Sister    Asthma Sister    Rheum arthritis Daughter    Hypertension Daughter    Fibromyalgia Daughter    Hypertension Daughter     Past Surgical History:  Procedure Laterality Date    CARDIOVERSION N/A 11/08/2013   Procedure: CARDIOVERSION;  Surgeon: Lelon Perla, MD;  Location: [facility] ENDOSCOPY;  Service: Cardiovascular;  Laterality: N/A;   COLONOSCOPY  2007   COLONOSCOPY W/ POLYPECTOMY  2009   EXCISION OF KELOID N/A 05/18/2014   Procedure: EXCISION OF KELOID;  Surgeon: Erroll Luna, MD;  Location: [place];  Service: General;  Laterality: N/A;   PORT-A-CATH REMOVAL Right 05/18/2014   Procedure: REMOVAL PORT-A-CATH;  Surgeon: Erroll Luna, MD;  Location: [place];  Service: General;  Laterality: Right;   PORTACATH PLACEMENT Right 02/17/2013   Procedure: INSERTION PORT-A-CATH;  Surgeon: Joyice Faster. Cornett, MD;  Location: [place];  Service: General;  Laterality: Right;   TEE WITHOUT CARDIOVERSION N/A 11/08/2013   Procedure: TRANSESOPHAGEAL ECHOCARDIOGRAM (TEE);  Surgeon: Lelon Perla, MD;  Location: [facility] ENDOSCOPY;  Service: Cardiovascular;  Laterality: N/A;   TOTAL KNEE ARTHROPLASTY Left 2003   TOTAL THYROIDECTOMY  TUBAL LIGATION  1985   Social History   Occupational History   Occupation: Disability  Tobacco Use   Smoking status: Former    Packs/day: 0.25    Years: 20.00    Total pack years: 5.00    Types: Cigarettes    Quit date: 02/10/1991    Years since quitting: 30.6    Passive exposure: Never   Smokeless tobacco: Never  Vaping Use   Vaping Use: Never used  Substance and Sexual Activity   Alcohol use: No   Drug use: No   Sexual activity: Never    Birth control/protection: Surgical

## 2021-09-20 ENCOUNTER — Other Ambulatory Visit: Payer: Self-pay | Admitting: Nurse Practitioner

## 2021-09-20 NOTE — Telephone Encounter (Signed)
VOB submitted for SynviscOne, right knee.  

## 2021-09-23 ENCOUNTER — Telehealth: Payer: Self-pay

## 2021-09-23 DIAGNOSIS — R2681 Unsteadiness on feet: Secondary | ICD-10-CM | POA: Diagnosis not present

## 2021-09-23 DIAGNOSIS — M6281 Muscle weakness (generalized): Secondary | ICD-10-CM | POA: Diagnosis not present

## 2021-09-23 DIAGNOSIS — R5381 Other malaise: Secondary | ICD-10-CM | POA: Diagnosis not present

## 2021-09-23 DIAGNOSIS — M170 Bilateral primary osteoarthritis of knee: Secondary | ICD-10-CM

## 2021-09-23 DIAGNOSIS — M1990 Unspecified osteoarthritis, unspecified site: Secondary | ICD-10-CM | POA: Diagnosis not present

## 2021-09-23 NOTE — Telephone Encounter (Signed)
Called and left a VM for patient to CB to schedule an appointment with Dr. Marlou Sa for gel injection.

## 2021-09-23 NOTE — Progress Notes (Deleted)
GI Office Note    Referring Provider: Coral Spikes, DO Primary Care Physician:  Coral Spikes, DO  Primary Gastroenterologist: Dr. Gala Romney  Chief Complaint   No chief complaint on file.   History of Present Illness   [name] is a 66 y.o. female presenting today at the request of Coral Spikes, DO for ***hospital follow up.   Recently hospitalized in August 2023 with abdominal pain, nausea, and vomiting. Reported severe epigastric pain. Noted a prior similar episode 5 months prior.  She reported her emesis being bilious.  Denied any melena, hematochezia, fever, chills, hematemesis, or change in weight.  Initial labs were unremarkable.  Some bacteriuria on UA.  CT A/P with contrast showing cholelithiasis without signs of cholecystitis or other intra-abdominal abnormality.  She presented with a fever shortly after arriving to the hospital.  Urine culture ordered along with right upper quadrant ultrasound and she was started on pantoprazole 40 mg twice daily IV and given Zofran and Reglan as needed for nausea.  She then developed mild leukocytosis and slight decrease in her hemoglobin to 11.7 and increasing creatinine which reached concern for infectious etiology.  Prior quadrant ultrasound with gallstones but no acute cholecystitis.  Hepatic steatosis noted as well.  2 days after admission she had resolution of her abdominal pain, nausea/vomiting.  Outpatient EGD advised.  She was treated for E. coli UTI with persisted with fever and family requested transfer to [place] long.  Infectious disease was consulted.  On 09/08/2021 she developed transaminitis with AST 392 and ALT 269.  She did not have any abdominal pain.  Statin was stopped and a repeat abdominal ultrasound ordered which again revealed cholelithiasis without cholecystitis.  Hepatic steatosis noted again.  On discharge patient labs were AST 230, ALT 229, alk phos 242, T. bili 0.7.  Acute hepatitis panel negative.  Patient was  discharged to a SNF [facility]).  Prior to discharge her case was discussed with [place], NP continue to advise possible EGD outpatient and to follow closely with LFTs and consideration of HIDA scan given difficulty to perform MRCP given body habitus.  She was also advised to continue holding her statin until improvement in LFTs.  Last performed 09/12/2021 with AST 57, ALT 137, alk phos 182, T. bili 0.4   Today:    Current Outpatient Medications  Medication Sig Dispense Refill   albuterol (PROVENTIL) (2.5 MG/3ML) 0.083% nebulizer solution USE 1 VIAL IN NEBULIZER EVERY 6 HOURS AS NEEDED FOR WHEEZING OR SHORTNESS OF BREATH. (Patient taking differently: Take 2.5 mg by nebulization every 6 (six) hours as needed for wheezing or shortness of breath.) 150 mL 0   albuterol (VENTOLIN HFA) 108 (90 Base) MCG/ACT inhaler INHALE 2 PUFFS INTO THE LUNGS EVERY 6 HOURS AS NEEDED FOR SHORTNESS OF BREATH OR WHEEZING. (Patient taking differently: Inhale 2 puffs into the lungs every 6 (six) hours as needed for wheezing or shortness of breath.) 18 g 2   amLODipine (NORVASC) 10 MG tablet TAKE (1) TABLET BY MOUTH ONCE DAILY. (Patient taking differently: Take 10 mg by mouth daily.) 90 tablet 0   benazepril (LOTENSIN) 40 MG tablet TAKE (1) TABLET BY MOUTH ONCE DAILY. (Patient taking differently: Take 40 mg by mouth daily.) 90 tablet 0   carvedilol (COREG) 12.5 MG tablet TAKE (1) TABLET BY MOUTH TWICE DAILY. (Patient taking differently: Take 12.5 mg by mouth 2 (two) times daily with a meal.) 180 tablet 0   Dulaglutide (TRULICITY) 0.96 GE/3.6OQ SOPN Inject  0.75 mg into the skin once a week.     ELIQUIS 5 MG TABS tablet TAKE (1) TABLET BY MOUTH TWICE DAILY. (Patient taking differently: Take 5 mg by mouth 2 (two) times daily.) 60 tablet 5   fluticasone (FLOVENT HFA) 44 MCG/ACT inhaler Inhale 2 puffs into the lungs 2 (two) times daily. 1 Inhaler 5   HUMALOG MIX 75/25 KWIKPEN (75-25) 100 UNIT/ML Kwikpen Inject 12 Units into the  skin in the morning and at bedtime.  11   Incontinence Supply Disposable (DEPEND UNDERWEAR X-LARGE) MISC Use daily as needed. 26 each 6   Insulin Pen Needle 31G X 8 MM MISC Use to inject insulin one time daily     levothyroxine (SYNTHROID) 100 MCG tablet Take 100 mcg by mouth daily before breakfast.     ONETOUCH ULTRA test strip USE TO TEST BLOOD SUGAR 4 TIMES DAILY AS DIRECTED. 100 strip 5   pantoprazole (PROTONIX) 40 MG tablet Take 1 tablet (40 mg total) by mouth 2 (two) times daily before a meal. 60 tablet 0   polyethylene glycol (MIRALAX / GLYCOLAX) 17 g packet Take 17 g by mouth daily as needed for mild constipation. 14 each 0   pregabalin (LYRICA) 100 MG capsule Take 1 capsule (100 mg total) by mouth 3 (three) times daily. 10 capsule 0   traMADol (ULTRAM) 50 MG tablet Take 1 tablet (50 mg total) by mouth every 8 (eight) hours as needed for moderate pain or severe pain. 8 tablet 0   No current facility-administered medications for this visit.    Past Medical History:  Diagnosis Date   Asthma    prn neb. and inhaler   Breast cancer ([place]) 01/2013   right   Dehydration 04/13/2013   GERD (gastroesophageal reflux disease)    Graves' disease    Hyperlipidemia    Hypertension    on multiple meds., has been on med. > 14 yr.   Non-insulin dependent type 2 diabetes mellitus ([place])    Obesity    Ophthalmic manifestation of Graves disease    Osteoarthritis 2003   left knee   Radiation 10/11/13-11/29/13   Right Breast   Sleep apnea 4/16   mod   Wears glasses     Past Surgical History:  Procedure Laterality Date   CARDIOVERSION N/A 11/08/2013   Procedure: CARDIOVERSION;  Surgeon: Lelon Perla, MD;  Location: [facility] ENDOSCOPY;  Service: Cardiovascular;  Laterality: N/A;   COLONOSCOPY  2007   COLONOSCOPY W/ POLYPECTOMY  2009   EXCISION OF KELOID N/A 05/18/2014   Procedure: EXCISION OF KELOID;  Surgeon: Erroll Luna, MD;  Location: [place];  Service: General;  Laterality:  N/A;   PORT-A-CATH REMOVAL Right 05/18/2014   Procedure: REMOVAL PORT-A-CATH;  Surgeon: Erroll Luna, MD;  Location: [place];  Service: General;  Laterality: Right;   PORTACATH PLACEMENT Right 02/17/2013   Procedure: INSERTION PORT-A-CATH;  Surgeon: Joyice Faster. Cornett, MD;  Location: [place];  Service: General;  Laterality: Right;   TEE WITHOUT CARDIOVERSION N/A 11/08/2013   Procedure: TRANSESOPHAGEAL ECHOCARDIOGRAM (TEE);  Surgeon: Lelon Perla, MD;  Location: [facility] ENDOSCOPY;  Service: Cardiovascular;  Laterality: N/A;   TOTAL KNEE ARTHROPLASTY Left 2003   TOTAL THYROIDECTOMY     TUBAL LIGATION  1985    Family History  Problem Relation Age of Onset   Diabetes Mother    Heart disease Father    Lung cancer Father    Diabetes Sister    Hypertension  Sister    Asthma Sister    Rheum arthritis Daughter    Hypertension Daughter    Fibromyalgia Daughter    Hypertension Daughter     Allergies as of 09/24/2021 - Review Complete 09/19/2021  Allergen Reaction Noted   Procardia [nifedipine] Other (See Comments) 11/14/2010   Aspirin  04/04/2014   Diltiazem Itching 11/04/2013    Social History   Socioeconomic History   Marital status: Divorced    Spouse name: Not on file   Number of children: 2   Years of education: Not on file   Highest education level: Not on file  Occupational History   Occupation: Disability  Tobacco Use   Smoking status: Former    Packs/day: 0.25    Years: 20.00    Total pack years: 5.00    Types: Cigarettes    Quit date: 02/10/1991    Years since quitting: 30.6    Passive exposure: Never   Smokeless tobacco: Never  Vaping Use   Vaping Use: Never used  Substance and Sexual Activity   Alcohol use: No   Drug use: No   Sexual activity: Never    Birth control/protection: Surgical  Other Topics Concern   Not on file  Social History Narrative   Lives in [place] Strain: Low Risk  (04/30/2021)   Overall Financial Resource Strain (CARDIA)    Difficulty of Paying Living Expenses: Not hard at all  Food Insecurity: No Food Insecurity (04/30/2021)   Hunger Vital Sign    Worried About Running Out of Food in the Last Year: Never true    Ran Out of Food in the Last Year: Never true  Transportation Needs: No Transportation Needs (04/30/2021)   PRAPARE - Hydrologist (Medical): No    Lack of Transportation (Non-Medical): No  Physical Activity: Inactive (04/30/2021)   Exercise Vital Sign    Days of Exercise per Week: 0 days    Minutes of Exercise per Session: 0 min  Stress: No Stress Concern Present (04/30/2021)   [place]    Feeling of Stress : Not at all  Social Connections: Moderately Isolated (04/30/2021)   Social Connection and Isolation Panel [NHANES]    Frequency of Communication with Friends and Family: Once a week    Frequency of Social Gatherings with Friends and Family: More than three times a week    Attends Religious Services: More than 4 times per year    Active Member of [employer] or Organizations: No    Attends Archivist Meetings: Never    Marital Status: Divorced  Human resources officer Violence: Not At Risk (04/30/2021)   Humiliation, Afraid, Rape, and Kick questionnaire    Fear of Current or Ex-Partner: No    Emotionally Abused: No    Physically Abused: No    Sexually Abused: No     Review of Systems   Gen: Denies any fever, chills, fatigue, weight loss, lack of appetite.  CV: Denies chest pain, heart palpitations, peripheral edema, syncope.  Resp: Denies shortness of breath at rest or with exertion. Denies wheezing or cough.  GI: see HPI GU : Denies urinary burning, urinary frequency, urinary hesitancy MS: Denies joint pain, muscle weakness, cramps, or limitation of movement.  Derm: Denies rash, itching, dry skin Psych:  Denies depression, anxiety, memory loss, and confusion Heme: Denies bruising, bleeding, and  enlarged lymph nodes.   Physical Exam   There were no vitals taken for this visit.  *** General:   Alert and oriented. Pleasant and cooperative. Well-nourished and well-developed.  Head:  Normocephalic and atraumatic. Eyes:  Without icterus, sclera clear and conjunctiva pink.  Ears:  Normal auditory acuity. Mouth:  No deformity or lesions, oral mucosa pink.  Lungs:  Clear to auscultation bilaterally. No wheezes, rales, or rhonchi. No distress.  Heart:  S1, S2 present without murmurs appreciated.  Abdomen:  +BS, soft, non-tender and non-distended. No HSM noted. No guarding or rebound. No masses appreciated.  Rectal:  Deferred  Msk:  Symmetrical without gross deformities. Normal posture. Extremities:  Without edema. Neurologic:  Alert and  oriented x4;  grossly normal neurologically. Skin:  Intact without significant lesions or rashes. Psych:  Alert and cooperative. Normal mood and affect.   Assessment   [name] is a 66 y.o. female with a history of asthma, breast cancer (right), GERD, Graves' disease, HLD, HTN, insulin dependent diabetes, osteoarthritis, OSA, and obesity  presenting today for hospital follow up.   Abdominal pain/Nausea/vomiting: Recent mission to hospital in August 2023.  She was treated with IV fluids, antiemetics, and pantoprazole 40 mg twice daily.  To improvement in her symptoms within 3 days but was kept inpatient due to ongoing fevers as well as mobility issues related to her osteoarthritis in her knee.  Elevated LFTs/hepatic steatosis: Initial LFTs normal on admission.  Initial right upper quadrant ultrasound noting cholelithiasis without cholecystitis.  CMP checked on 09/08/2021 with AST 392, ALT 269, alk phos 243.  Acute hepatitis panel negative.  Repeat abdominal ultrasound ordered revealing again cholelithiasis without cholecystitis and hepatic steatosis.   Repeat LFTs on day of discharge improved to AST 230, ALT 222, alk phos 242 and T. bili remain within normal limits.  Labs repeated 3 days after discharge at SNF revealing further improvement with AST 57, ALT 137, alk phos 192, T. bili 0.4.  Suspect elevation likely due to ongoing fever/possible sepsis.  Fatty liver likely result of diabetes and obesity.  We will recheck LFTs within the next week to check for continued improvement.   PLAN   ***    Venetia Night, MSN, FNP-BC, AGACNP-BC [facility]

## 2021-09-24 ENCOUNTER — Ambulatory Visit: Payer: Medicare Other | Admitting: Gastroenterology

## 2021-09-25 ENCOUNTER — Telehealth: Payer: Self-pay | Admitting: Orthopedic Surgery

## 2021-09-25 ENCOUNTER — Ambulatory Visit: Payer: Medicare Other | Admitting: Orthopedic Surgery

## 2021-09-25 DIAGNOSIS — M6281 Muscle weakness (generalized): Secondary | ICD-10-CM | POA: Diagnosis not present

## 2021-09-25 DIAGNOSIS — R5381 Other malaise: Secondary | ICD-10-CM | POA: Diagnosis not present

## 2021-09-25 DIAGNOSIS — R2681 Unsteadiness on feet: Secondary | ICD-10-CM | POA: Diagnosis not present

## 2021-09-25 DIAGNOSIS — M1990 Unspecified osteoarthritis, unspecified site: Secondary | ICD-10-CM | POA: Diagnosis not present

## 2021-09-25 NOTE — Telephone Encounter (Signed)
Patient called in stating she is having a burning sensation in her leg, she was supposed to come in to get gel injection but being she is in a rehab facility she was unable to make it, she would like to know what can she do for the pain and  burning until she can get the injection please advise please call [place],Fikisha (Daughter)  [phone]

## 2021-09-30 DIAGNOSIS — M6281 Muscle weakness (generalized): Secondary | ICD-10-CM | POA: Diagnosis not present

## 2021-09-30 DIAGNOSIS — I10 Essential (primary) hypertension: Secondary | ICD-10-CM | POA: Diagnosis not present

## 2021-09-30 DIAGNOSIS — E890 Postprocedural hypothyroidism: Secondary | ICD-10-CM | POA: Diagnosis not present

## 2021-09-30 DIAGNOSIS — R5381 Other malaise: Secondary | ICD-10-CM | POA: Diagnosis not present

## 2021-09-30 DIAGNOSIS — R2681 Unsteadiness on feet: Secondary | ICD-10-CM | POA: Diagnosis not present

## 2021-09-30 DIAGNOSIS — E1165 Type 2 diabetes mellitus with hyperglycemia: Secondary | ICD-10-CM | POA: Diagnosis not present

## 2021-09-30 DIAGNOSIS — E7800 Pure hypercholesterolemia, unspecified: Secondary | ICD-10-CM | POA: Diagnosis not present

## 2021-09-30 DIAGNOSIS — M1990 Unspecified osteoarthritis, unspecified site: Secondary | ICD-10-CM | POA: Diagnosis not present

## 2021-10-04 DIAGNOSIS — E039 Hypothyroidism, unspecified: Secondary | ICD-10-CM | POA: Diagnosis not present

## 2021-10-04 DIAGNOSIS — N1831 Chronic kidney disease, stage 3a: Secondary | ICD-10-CM | POA: Diagnosis not present

## 2021-10-04 DIAGNOSIS — E1122 Type 2 diabetes mellitus with diabetic chronic kidney disease: Secondary | ICD-10-CM | POA: Diagnosis not present

## 2021-10-04 DIAGNOSIS — A689 Relapsing fever, unspecified: Secondary | ICD-10-CM | POA: Diagnosis not present

## 2021-10-07 DIAGNOSIS — M6281 Muscle weakness (generalized): Secondary | ICD-10-CM | POA: Diagnosis not present

## 2021-10-07 DIAGNOSIS — R262 Difficulty in walking, not elsewhere classified: Secondary | ICD-10-CM | POA: Diagnosis not present

## 2021-10-07 DIAGNOSIS — G4733 Obstructive sleep apnea (adult) (pediatric): Secondary | ICD-10-CM | POA: Diagnosis not present

## 2021-10-09 DIAGNOSIS — E1122 Type 2 diabetes mellitus with diabetic chronic kidney disease: Secondary | ICD-10-CM | POA: Diagnosis not present

## 2021-10-09 DIAGNOSIS — I4892 Unspecified atrial flutter: Secondary | ICD-10-CM | POA: Diagnosis not present

## 2021-10-09 DIAGNOSIS — G4733 Obstructive sleep apnea (adult) (pediatric): Secondary | ICD-10-CM | POA: Diagnosis not present

## 2021-10-09 DIAGNOSIS — E039 Hypothyroidism, unspecified: Secondary | ICD-10-CM | POA: Diagnosis not present

## 2021-10-09 DIAGNOSIS — M1711 Unilateral primary osteoarthritis, right knee: Secondary | ICD-10-CM | POA: Diagnosis not present

## 2021-10-09 DIAGNOSIS — K8020 Calculus of gallbladder without cholecystitis without obstruction: Secondary | ICD-10-CM | POA: Diagnosis not present

## 2021-10-09 DIAGNOSIS — J4520 Mild intermittent asthma, uncomplicated: Secondary | ICD-10-CM | POA: Diagnosis not present

## 2021-10-09 DIAGNOSIS — Z7901 Long term (current) use of anticoagulants: Secondary | ICD-10-CM | POA: Diagnosis not present

## 2021-10-09 DIAGNOSIS — K760 Fatty (change of) liver, not elsewhere classified: Secondary | ICD-10-CM | POA: Diagnosis not present

## 2021-10-09 DIAGNOSIS — Z794 Long term (current) use of insulin: Secondary | ICD-10-CM | POA: Diagnosis not present

## 2021-10-09 DIAGNOSIS — E0500 Thyrotoxicosis with diffuse goiter without thyrotoxic crisis or storm: Secondary | ICD-10-CM | POA: Diagnosis not present

## 2021-10-09 DIAGNOSIS — E785 Hyperlipidemia, unspecified: Secondary | ICD-10-CM | POA: Diagnosis not present

## 2021-10-09 DIAGNOSIS — I4891 Unspecified atrial fibrillation: Secondary | ICD-10-CM | POA: Diagnosis not present

## 2021-10-09 DIAGNOSIS — N1831 Chronic kidney disease, stage 3a: Secondary | ICD-10-CM | POA: Diagnosis not present

## 2021-10-09 DIAGNOSIS — I150 Renovascular hypertension: Secondary | ICD-10-CM | POA: Diagnosis not present

## 2021-10-10 ENCOUNTER — Telehealth: Payer: Self-pay | Admitting: Family Medicine

## 2021-10-10 NOTE — Telephone Encounter (Signed)
Sharyn Lull calling from [facility]. She evaluated pt for home health PT. Sharyn Lull would like to see pt BID for 8 weeks for strength/mobility training. Also, Sharyn Lull states that pt Carvedilol is interacting with all of her breathing medications/treatment. Pt has area in gluteal fold and has been using triamcinolone cream-Michelle needing to know if that is OK. Please advise. Thank you  [phone]

## 2021-10-11 ENCOUNTER — Telehealth: Payer: Self-pay | Admitting: Orthopedic Surgery

## 2021-10-11 ENCOUNTER — Ambulatory Visit: Payer: Medicare Other | Admitting: Orthopedic Surgery

## 2021-10-11 ENCOUNTER — Other Ambulatory Visit: Payer: Self-pay | Admitting: Family Medicine

## 2021-10-11 DIAGNOSIS — M170 Bilateral primary osteoarthritis of knee: Secondary | ICD-10-CM

## 2021-10-11 DIAGNOSIS — M1711 Unilateral primary osteoarthritis, right knee: Secondary | ICD-10-CM

## 2021-10-11 MED ORDER — METOPROLOL SUCCINATE ER 100 MG PO TB24: 100.0000 mg | ORAL_TABLET | Freq: Every day | ORAL | 3 refills | Status: DC

## 2021-10-11 NOTE — Telephone Encounter (Signed)
Left message to return call 

## 2021-10-11 NOTE — Telephone Encounter (Signed)
Please advise of patients work status. [employer] forms have been received. Thanks!

## 2021-10-12 ENCOUNTER — Encounter: Payer: Self-pay | Admitting: Orthopedic Surgery

## 2021-10-12 MED ORDER — HYLAN G-F 20 48 MG/6ML IX SOSY
48.0000 mg | PREFILLED_SYRINGE | INTRA_ARTICULAR | Status: AC | PRN
  Administered 2021-10-11: 48 mg via INTRA_ARTICULAR

## 2021-10-12 MED ORDER — LIDOCAINE HCL 1 % IJ SOLN
5.0000 mL | INTRAMUSCULAR | Status: AC | PRN
  Administered 2021-10-11: 5 mL

## 2021-10-12 NOTE — Progress Notes (Signed)
   Procedure Note  Patient: [name]             Date of Birth: 1955-12-13           MRN: [id number]             Visit Date: 10/11/2021  Procedures: Visit Diagnoses:  1. Primary osteoarthritis of both knees     Large Joint Inj: R knee on 10/11/2021 10:20 PM Indications: pain, joint swelling and diagnostic evaluation Details: 18 G 1.5 in needle, superolateral approach  Arthrogram: No  Medications: 5 mL lidocaine 1 %; 48 mg Hylan 48 MG/6ML Outcome: tolerated well, no immediate complications Procedure, treatment alternatives, risks and benefits explained, specific risks discussed. Consent was given by the patient. Immediately prior to procedure a time out was called to verify the correct patient, procedure, equipment, support staff and site/side marked as required. Patient was prepped and draped in the usual sterile fashion.

## 2021-10-14 ENCOUNTER — Telehealth: Payer: Self-pay | Admitting: Family Medicine

## 2021-10-14 ENCOUNTER — Other Ambulatory Visit: Payer: Self-pay

## 2021-10-14 MED ORDER — TRIAMCINOLONE ACETONIDE 0.1 % EX CREA: 1.0000 | TOPICAL_CREAM | Freq: Two times a day (BID) | CUTANEOUS | 2 refills | Status: DC

## 2021-10-14 NOTE — Telephone Encounter (Signed)
Pt contacted and states she is no longer using Triamcinolone cream. Pt states she is not having any issues with Coreg-does she need to switch to Metoprolol? Please advise. Thank you  Mallory contacted from additional phone message and states she will let Sharyn Lull know her orders were approved.

## 2021-10-14 NOTE — Telephone Encounter (Signed)
Mallory contacted and verbalized understanding.

## 2021-10-14 NOTE — Telephone Encounter (Signed)
Please advise. Thank you

## 2021-10-14 NOTE — Telephone Encounter (Signed)
Home Health Occupational Therapy needing verbal orders for one week for eight weeks.. [phone]

## 2021-10-16 NOTE — Telephone Encounter (Signed)
I never took her oow

## 2021-10-17 NOTE — Telephone Encounter (Signed)
IC,lmvm for pt to [facility]

## 2021-10-17 NOTE — Telephone Encounter (Signed)
I spoke with patient, advised we are unable to complete the [employer] forms as Dr. Marlou Sa didn't take her out of work. She stated she had what she needed and expressed understanding. Advised her to disregard the packet from [employer].

## 2021-10-18 NOTE — Telephone Encounter (Signed)
The daughter called and said that the FMLA is not needed at this time this was supposed to go to another doctor and they sent to every doctor in her care. Disregard the [facility]

## 2021-10-21 ENCOUNTER — Telehealth: Payer: Self-pay | Admitting: Orthopedic Surgery

## 2021-10-21 NOTE — Telephone Encounter (Signed)
Pt called requesting a call back to discuss next option. Injection did not work. Please call pt at [phone].

## 2021-10-22 DIAGNOSIS — G4733 Obstructive sleep apnea (adult) (pediatric): Secondary | ICD-10-CM | POA: Diagnosis not present

## 2021-10-23 NOTE — Telephone Encounter (Signed)
Rf to weight loss clinic no other real interventional options available nowsince cortisone and gel injections did not work

## 2021-10-23 NOTE — Telephone Encounter (Signed)
IC patient and advised. She did not want referral stating she is already working on weight loss on her own

## 2021-10-28 ENCOUNTER — Ambulatory Visit: Payer: Self-pay | Admitting: *Deleted

## 2021-10-28 ENCOUNTER — Encounter: Payer: Self-pay | Admitting: *Deleted

## 2021-10-28 NOTE — Patient Instructions (Signed)
Visit Information  Thank you for taking time to visit with me today. Please don't hesitate to contact me if I can be of assistance to you.   Please call the care guide team at [phone] if you need to cancel or reschedule your appointment.   If you are experiencing a Mental Health or Behavioral Health Crisis or need someone to talk to, please call the Suicide and Crisis Lifeline: 988 call the [place] National Suicide Prevention Lifeline: 1-800-273-8255 or TTY: 1-800-799-4 TTY (1-800-799-4889) to talk to a trained counselor call 1-800-273-TALK (toll free, 24 hour hotline) go to [facility] [address], [place] ([phone]) call the [place] Crisis Line: 800-939-9988 call 911  Patient verbalizes understanding of instructions and care plan provided today and agrees to view in MyChart. Active MyChart status and patient understanding of how to access instructions and care plan via MyChart confirmed with patient.     No further follow up required.  Jozelyn Kuwahara, BSW, MSW, LCSW  Licensed Clinical Social Worker  [facility] [place] [facility]  Mailing Address-[address], [place], [place] [address] Physical Address-[address], [place], [place] [address] Toll Free Main # [phone] Fax # [phone] Cell # [phone] [email or web address]            

## 2021-10-28 NOTE — Patient Outreach (Signed)
  Care Coordination   Initial Visit Note   10/28/2021  Name: [name] MRN: [id number] DOB: 1955/10/22  [name] is a 66 y.o. year old female who sees Lacey Spikes, DO for primary care. I spoke with [name] by phone today.  What matters to the patients health and wellness today?  No Interventions Identified.  CSW collaboration with Primary Care Provider, [name], to request refill of prescription pain medication, as well as increased dosage, per patient's request.  SDOH assessments and interventions completed:  Yes.  SDOH Interventions Today    Flowsheet Row Most Recent Value  SDOH Interventions   Food Insecurity Interventions Intervention Not Indicated  Housing Interventions Intervention Not Indicated  Transportation Interventions Intervention Not Indicated  Utilities Interventions Intervention Not Indicated  Alcohol Usage Interventions Intervention Not Indicated (Score <7)  Financial Strain Interventions Intervention Not Indicated  Physical Activity Interventions Patient Refused  Stress Interventions Intervention Not Indicated  Social Connections Interventions Intervention Not Indicated     Care Coordination Interventions Activated:  Yes.   Care Coordination Interventions:  Yes, provided.   Follow up plan: No further intervention required.   Encounter Outcome:  Pt. Visit Completed.   [name], BSW, MSW, LCSW  Licensed Education officer, [facility]  Mailing [place] [address] [place], [place] [address] Physical Address-[address] [place], [place] [address] Toll Free Main # [phone] Fax # [phone] Cell # [phone] [name]

## 2021-10-30 ENCOUNTER — Telehealth: Payer: Self-pay | Admitting: Family Medicine

## 2021-10-30 NOTE — Telephone Encounter (Signed)
CALL ON 10/28/21 FROM CARE COORDINATION  What matters to the patients health and wellness today?  No Interventions Identified.  CSW collaboration with Primary Care Provider, Dr. Thersa Salt, to request refill of prescription pain medication, as well as increased dosage, per patient's request.   PT IS CURRENTLY DOING PHYSICAL THERAPY AND OCCUPATIONAL THERAPY [place]. [place]

## 2021-10-31 NOTE — Telephone Encounter (Signed)
Left message to return call 

## 2021-11-01 ENCOUNTER — Telehealth: Payer: Self-pay

## 2021-11-01 ENCOUNTER — Other Ambulatory Visit: Payer: Self-pay | Admitting: Family Medicine

## 2021-11-01 DIAGNOSIS — M129 Arthropathy, unspecified: Secondary | ICD-10-CM

## 2021-11-01 MED ORDER — TRAMADOL HCL 50 MG PO TABS: 50.0000 mg | ORAL_TABLET | Freq: Three times a day (TID) | ORAL | 0 refills | Status: DC | PRN

## 2021-11-01 NOTE — Telephone Encounter (Signed)
Cook, Jayce G, DO   ? ?Rx sent.   ? ?

## 2021-11-01 NOTE — Telephone Encounter (Signed)
Left message to return call 

## 2021-11-01 NOTE — Telephone Encounter (Signed)
Encourage patient to contact the pharmacy for refills or they can request refills through [facility]  (Please schedule appointment if patient has not been seen in over a year)    WHAT PHARMACY WOULD THEY LIKE THIS SENT TO: [facility] (ULTRAM) 50 MG tablet   NOTES/COMMENTS FROM PATIENT:Pt is out of medication she will follow up on orthopedic regarding increase medication she is in PT she has been in rehab for over a month PT is recommending medication increase       Front office please notify patient: It takes 48-72 hours to process rx refill requests Ask patient to call pharmacy to ensure rx is ready before heading there.

## 2021-11-01 NOTE — Telephone Encounter (Signed)
Pt returned call and has appt scheduled for 11/06/21

## 2021-11-04 NOTE — Telephone Encounter (Signed)
Patient aware and has follow up scheduled 11/06/21 with Dr Lacinda Axon

## 2021-11-06 ENCOUNTER — Ambulatory Visit: Payer: Medicare Other | Admitting: Family Medicine

## 2021-11-06 ENCOUNTER — Telehealth: Payer: Self-pay

## 2021-11-06 VITALS — BP 171/92 | HR 78 | Temp 97.9°F | Ht 64.5 in

## 2021-11-06 DIAGNOSIS — E785 Hyperlipidemia, unspecified: Secondary | ICD-10-CM | POA: Diagnosis not present

## 2021-11-06 DIAGNOSIS — Z23 Encounter for immunization: Secondary | ICD-10-CM

## 2021-11-06 DIAGNOSIS — M129 Arthropathy, unspecified: Secondary | ICD-10-CM

## 2021-11-06 DIAGNOSIS — I10 Essential (primary) hypertension: Secondary | ICD-10-CM

## 2021-11-06 DIAGNOSIS — E1169 Type 2 diabetes mellitus with other specified complication: Secondary | ICD-10-CM

## 2021-11-06 DIAGNOSIS — M1711 Unilateral primary osteoarthritis, right knee: Secondary | ICD-10-CM | POA: Diagnosis not present

## 2021-11-06 DIAGNOSIS — D649 Anemia, unspecified: Secondary | ICD-10-CM | POA: Diagnosis not present

## 2021-11-06 DIAGNOSIS — N1831 Chronic kidney disease, stage 3a: Secondary | ICD-10-CM

## 2021-11-06 DIAGNOSIS — N183 Chronic kidney disease, stage 3 unspecified: Secondary | ICD-10-CM | POA: Insufficient documentation

## 2021-11-06 MED ORDER — TRAMADOL HCL 50 MG PO TABS: 50.0000 mg | ORAL_TABLET | Freq: Three times a day (TID) | ORAL | 0 refills | Status: DC | PRN

## 2021-11-06 NOTE — Patient Instructions (Signed)
Labs today.  I will work on the referral.  Medication as directed 1-1.5 tablets every 8 hours as needed.  Follow up in 3 months.

## 2021-11-06 NOTE — Progress Notes (Signed)
 Subjective:  Patient ID: [name], female    DOB: 10/01/1955  Age: 66 y.o. MRN: [id number]  CC: Chief Complaint  Patient presents with  . Pain Management    Right knee pain - discuss pain meds    HPI:  66-year-old female with an extensive past medical history including hypertension, OSA, type 2 diabetes, hypothyroidism, chronic kidney disease, morbid obesity presents for follow-up.  Patient is now unable to ambulate.  She is in a wheelchair today.  She requires lives at home.  She has severe right knee osteoarthritis and associated pain.  She is taking tramadol with some improvement.  Would like to discuss increase.  She is working with physical therapy.  Patient needs knee replacement.  However, she is a high risk surgical candidate given her obesity.  We will discuss this today.  Patient's BP was mildly elevated initially.  Repeat was higher.  Patient Active Problem List   Diagnosis Date Noted  . Class 3 obesity (HCC) 09/01/2021  . H/O atrial flutter 09/01/2021  . Diabetic neuropathy (HCC) 04/30/2021  . Arthritis, multiple joint involvement 10/31/2020  . Healthcare maintenance 10/31/2020  . Depression 05/24/2015  . OSA (obstructive sleep apnea) 04/04/2014  . Mitral regurgitation 11/08/2013  . Atrial flutter (HCC) 11/05/2013  . Acute kidney injury superimposed on chronic kidney disease (HCC) 05/08/2013  . Hypothyroidism 05/04/2013  . Malignant neoplasm of upper-outer quadrant of right breast in female, estrogen receptor positive (HCC) 01/31/2013  . Asthma 12/08/2010  . Hypertension   . Type 2 diabetes mellitus with hyperlipidemia (HCC)   . Tobacco abuse, in remission     Social Hx   Social History   Socioeconomic History  . Marital status: Divorced    Spouse name: Not on file  . Number of children: 2  . Years of education: 12  . Highest education level: 12th grade  Occupational History  . Occupation: Disability  Tobacco Use  . Smoking status: Former     Packs/day: 0.25    Years: 20.00    Total pack years: 5.00    Types: Cigarettes    Quit date: 02/10/1991    Years since quitting: 30.7    Passive exposure: Past  . Smokeless tobacco: Never  Vaping Use  . Vaping Use: Never used  Substance and Sexual Activity  . Alcohol use: No  . Drug use: No  . Sexual activity: Not Currently    Partners: Male    Birth control/protection: Surgical  Other Topics Concern  . Not on file  Social History Narrative   Lives in [place]         Social Determinants of Health   Financial Resource Strain: Low Risk  (10/28/2021)   Overall Financial Resource Strain (CARDIA)   . Difficulty of Paying Living Expenses: Not hard at all  Food Insecurity: No Food Insecurity (10/28/2021)   Hunger Vital Sign   . Worried About Running Out of Food in the Last Year: Never true   . Ran Out of Food in the Last Year: Never true  Transportation Needs: No Transportation Needs (10/28/2021)   PRAPARE - Transportation   . Lack of Transportation (Medical): No   . Lack of Transportation (Non-Medical): No  Physical Activity: Inactive (10/28/2021)   Exercise Vital Sign   . Days of Exercise per Week: 0 days   . Minutes of Exercise per Session: 0 min  Stress: No Stress Concern Present (10/28/2021)   [employer] - Occupational Stress Questionnaire   .   Feeling of Stress : Not at all  Social Connections: Moderately Integrated (10/28/2021)   Social Connection and Isolation Panel [NHANES]   . Frequency of Communication with Friends and Family: More than three times a week   . Frequency of Social Gatherings with Friends and Family: More than three times a week   . Attends Religious Services: More than 4 times per year   . Active Member of Clubs or Organizations: Yes   . Attends Club or Organization Meetings: 1 to 4 times per year   . Marital Status: Divorced    Review of Systems Per HPI  Objective:  BP (!) 171/92   Pulse 78   Temp 97.9 F  (36.6 C)   Ht 5' 4.5" (1.638 m)   SpO2 96%   BMI 54.36 kg/m      11/06/2021   12:14 PM 11/06/2021   11:28 AM 09/09/2021    1:31 PM  BP/Weight  Systolic BP 171 157 123  Diastolic BP 92 84 52    Physical Exam  Lab Results  Component Value Date   WBC 5.4 09/09/2021   HGB 10.6 (L) 09/09/2021   HCT 32.1 (L) 09/09/2021   PLT 269 09/09/2021   GLUCOSE 157 (H) 09/09/2021   CHOL 157 05/30/2021   TRIG 63 05/30/2021   HDL 74 05/30/2021   LDLCALC 70 05/30/2021   ALT 222 (H) 09/09/2021   AST 230 (H) 09/09/2021   NA 141 09/09/2021   K 3.8 09/09/2021   CL 106 09/09/2021   CREATININE 1.08 (H) 09/09/2021   BUN 13 09/09/2021   CO2 27 09/09/2021   TSH 1.330 05/30/2021   INR 1.6 (H) 09/09/2021   HGBA1C 7.4 (H) 05/30/2021     Assessment & Plan:   Problem List Items Addressed This Visit       Endocrine   Type 2 diabetes mellitus with hyperlipidemia (HCC) - Primary   Relevant Orders   CMP14+EGFR   Hemoglobin A1c   Lipid panel     Musculoskeletal and Integument   Arthritis, multiple joint involvement   Relevant Medications   traMADol (ULTRAM) 50 MG tablet   Other Visit Diagnoses     Anemia, unspecified type       Relevant Orders   CBC   Need for vaccination       Relevant Orders   Flu Vaccine QUAD High Dose(Fluad) (Completed)       Meds ordered this encounter  Medications  . traMADol (ULTRAM) 50 MG tablet    Sig: Take 1-1.5 tablets (50-75 mg total) by mouth every 8 (eight) hours as needed for moderate pain or severe pain.    Dispense:  90 tablet    Refill:  0    Follow-up:  No follow-ups on file.  Jayce Cook DO [facility] 

## 2021-11-06 NOTE — Telephone Encounter (Signed)
error 

## 2021-11-07 DIAGNOSIS — M1711 Unilateral primary osteoarthritis, right knee: Secondary | ICD-10-CM | POA: Insufficient documentation

## 2021-11-07 LAB — CBC
Hematocrit: 38.7 % (ref 34.0–46.6)
Hemoglobin: 12.3 g/dL (ref 11.1–15.9)
MCH: 27.6 pg (ref 26.6–33.0)
MCHC: 31.8 g/dL (ref 31.5–35.7)
MCV: 87 fL (ref 79–97)
Platelets: 215 10*3/uL (ref 150–450)
RBC: 4.45 x10E6/uL (ref 3.77–5.28)
RDW: 16.5 % — ABNORMAL HIGH (ref 11.7–15.4)
WBC: 5.5 10*3/uL (ref 3.4–10.8)

## 2021-11-07 LAB — LIPID PANEL
Chol/HDL Ratio: 2.2 ratio (ref 0.0–4.4)
Cholesterol, Total: 144 mg/dL (ref 100–199)
HDL: 66 mg/dL (ref >39)
LDL Chol Calc (NIH): 64 mg/dL (ref 0–99)
Triglycerides: 70 mg/dL (ref 0–149)
VLDL Cholesterol Cal: 14 mg/dL (ref 5–40)

## 2021-11-07 LAB — CMP14+EGFR
ALT: 15 IU/L (ref 0–32)
AST: 21 IU/L (ref 0–40)
Albumin/Globulin Ratio: 1.4 (ref 1.2–2.2)
Albumin: 4.2 g/dL (ref 3.9–4.9)
Alkaline Phosphatase: 93 IU/L (ref 44–121)
BUN/Creatinine Ratio: 11 — ABNORMAL LOW (ref 12–28)
BUN: 10 mg/dL (ref 8–27)
Bilirubin Total: 0.3 mg/dL (ref 0.0–1.2)
CO2: 25 mmol/L (ref 20–29)
Calcium: 9.2 mg/dL (ref 8.7–10.3)
Chloride: 101 mmol/L (ref 96–106)
Creatinine, Ser: 0.89 mg/dL (ref 0.57–1.00)
Globulin, Total: 3 g/dL (ref 1.5–4.5)
Glucose: 144 mg/dL — ABNORMAL HIGH (ref 70–99)
Potassium: 4.4 mmol/L (ref 3.5–5.2)
Sodium: 142 mmol/L (ref 134–144)
Total Protein: 7.2 g/dL (ref 6.0–8.5)
eGFR: 71 mL/min/{1.73_m2} (ref >59)

## 2021-11-07 LAB — HEMOGLOBIN A1C
Est. average glucose Bld gHb Est-mCnc: 174 mg/dL
Hgb A1c MFr Bld: 7.7 % — ABNORMAL HIGH (ref 4.8–5.6)

## 2021-11-07 NOTE — Assessment & Plan Note (Signed)
This is really limiting her mobility at this point.  Increasing tramadol.

## 2021-11-07 NOTE — Assessment & Plan Note (Signed)
Unclear why pressures not well controlled.  Labs today.  Continue current medications.  May need additional medication of hypertension remains uncontrolled.

## 2021-11-07 NOTE — Assessment & Plan Note (Signed)
Patient needs weight loss to improve quality of life and comorbidities.  She also needs this to be able to have knee replacement.  Referring to general surgery for consideration for bariatric surgery.

## 2021-11-08 DIAGNOSIS — J4520 Mild intermittent asthma, uncomplicated: Secondary | ICD-10-CM | POA: Diagnosis not present

## 2021-11-08 DIAGNOSIS — G4733 Obstructive sleep apnea (adult) (pediatric): Secondary | ICD-10-CM | POA: Diagnosis not present

## 2021-11-08 DIAGNOSIS — M1711 Unilateral primary osteoarthritis, right knee: Secondary | ICD-10-CM | POA: Diagnosis not present

## 2021-11-08 DIAGNOSIS — E785 Hyperlipidemia, unspecified: Secondary | ICD-10-CM | POA: Diagnosis not present

## 2021-11-08 DIAGNOSIS — E1122 Type 2 diabetes mellitus with diabetic chronic kidney disease: Secondary | ICD-10-CM | POA: Diagnosis not present

## 2021-11-08 DIAGNOSIS — Z7901 Long term (current) use of anticoagulants: Secondary | ICD-10-CM | POA: Diagnosis not present

## 2021-11-08 DIAGNOSIS — Z794 Long term (current) use of insulin: Secondary | ICD-10-CM | POA: Diagnosis not present

## 2021-11-08 DIAGNOSIS — N1831 Chronic kidney disease, stage 3a: Secondary | ICD-10-CM | POA: Diagnosis not present

## 2021-11-08 DIAGNOSIS — K8020 Calculus of gallbladder without cholecystitis without obstruction: Secondary | ICD-10-CM | POA: Diagnosis not present

## 2021-11-08 DIAGNOSIS — I150 Renovascular hypertension: Secondary | ICD-10-CM | POA: Diagnosis not present

## 2021-11-08 DIAGNOSIS — I4892 Unspecified atrial flutter: Secondary | ICD-10-CM | POA: Diagnosis not present

## 2021-11-08 DIAGNOSIS — K760 Fatty (change of) liver, not elsewhere classified: Secondary | ICD-10-CM | POA: Diagnosis not present

## 2021-11-08 DIAGNOSIS — I4891 Unspecified atrial fibrillation: Secondary | ICD-10-CM | POA: Diagnosis not present

## 2021-11-08 DIAGNOSIS — E0500 Thyrotoxicosis with diffuse goiter without thyrotoxic crisis or storm: Secondary | ICD-10-CM | POA: Diagnosis not present

## 2021-11-08 DIAGNOSIS — E039 Hypothyroidism, unspecified: Secondary | ICD-10-CM | POA: Diagnosis not present

## 2021-11-09 ENCOUNTER — Emergency Department (HOSPITAL_COMMUNITY): Payer: Medicare Other

## 2021-11-09 ENCOUNTER — Inpatient Hospital Stay (HOSPITAL_COMMUNITY)
Admission: EM | Admit: 2021-11-09 | Discharge: 2021-11-17 | DRG: 418 | Disposition: A | Discharge status: Home Health | Payer: Medicare Other | Attending: Internal Medicine | Admitting: Internal Medicine

## 2021-11-09 ENCOUNTER — Encounter (HOSPITAL_COMMUNITY): Payer: Self-pay

## 2021-11-09 ENCOUNTER — Other Ambulatory Visit: Payer: Self-pay

## 2021-11-09 DIAGNOSIS — K219 Gastro-esophageal reflux disease without esophagitis: Secondary | ICD-10-CM | POA: Diagnosis present

## 2021-11-09 DIAGNOSIS — J45909 Unspecified asthma, uncomplicated: Secondary | ICD-10-CM | POA: Diagnosis present

## 2021-11-09 DIAGNOSIS — R000 Tachycardia, unspecified: Secondary | ICD-10-CM | POA: Diagnosis not present

## 2021-11-09 DIAGNOSIS — E119 Type 2 diabetes mellitus without complications: Secondary | ICD-10-CM | POA: Diagnosis not present

## 2021-11-09 DIAGNOSIS — Z872 Personal history of diseases of the skin and subcutaneous tissue: Secondary | ICD-10-CM

## 2021-11-09 DIAGNOSIS — E785 Hyperlipidemia, unspecified: Secondary | ICD-10-CM | POA: Diagnosis not present

## 2021-11-09 DIAGNOSIS — Z6841 Body Mass Index (BMI) 40.0 and over, adult: Secondary | ICD-10-CM

## 2021-11-09 DIAGNOSIS — Z8269 Family history of other diseases of the musculoskeletal system and connective tissue: Secondary | ICD-10-CM

## 2021-11-09 DIAGNOSIS — I4892 Unspecified atrial flutter: Secondary | ICD-10-CM | POA: Diagnosis not present

## 2021-11-09 DIAGNOSIS — G8929 Other chronic pain: Secondary | ICD-10-CM | POA: Diagnosis present

## 2021-11-09 DIAGNOSIS — Z886 Allergy status to analgesic agent status: Secondary | ICD-10-CM

## 2021-11-09 DIAGNOSIS — E890 Postprocedural hypothyroidism: Secondary | ICD-10-CM | POA: Diagnosis present

## 2021-11-09 DIAGNOSIS — K8062 Calculus of gallbladder and bile duct with acute cholecystitis without obstruction: Principal | ICD-10-CM | POA: Diagnosis present

## 2021-11-09 DIAGNOSIS — E1165 Type 2 diabetes mellitus with hyperglycemia: Secondary | ICD-10-CM | POA: Diagnosis present

## 2021-11-09 DIAGNOSIS — Z801 Family history of malignant neoplasm of trachea, bronchus and lung: Secondary | ICD-10-CM

## 2021-11-09 DIAGNOSIS — Z01810 Encounter for preprocedural cardiovascular examination: Secondary | ICD-10-CM | POA: Diagnosis not present

## 2021-11-09 DIAGNOSIS — Z7401 Bed confinement status: Secondary | ICD-10-CM | POA: Diagnosis not present

## 2021-11-09 DIAGNOSIS — Z923 Personal history of irradiation: Secondary | ICD-10-CM

## 2021-11-09 DIAGNOSIS — F32A Depression, unspecified: Secondary | ICD-10-CM | POA: Diagnosis not present

## 2021-11-09 DIAGNOSIS — N179 Acute kidney failure, unspecified: Secondary | ICD-10-CM | POA: Diagnosis not present

## 2021-11-09 DIAGNOSIS — Z8249 Family history of ischemic heart disease and other diseases of the circulatory system: Secondary | ICD-10-CM

## 2021-11-09 DIAGNOSIS — E039 Hypothyroidism, unspecified: Secondary | ICD-10-CM | POA: Diagnosis not present

## 2021-11-09 DIAGNOSIS — R6510 Systemic inflammatory response syndrome (SIRS) of non-infectious origin without acute organ dysfunction: Secondary | ICD-10-CM

## 2021-11-09 DIAGNOSIS — K810 Acute cholecystitis: Secondary | ICD-10-CM | POA: Diagnosis not present

## 2021-11-09 DIAGNOSIS — I10 Essential (primary) hypertension: Secondary | ICD-10-CM | POA: Diagnosis present

## 2021-11-09 DIAGNOSIS — R5381 Other malaise: Secondary | ICD-10-CM | POA: Diagnosis not present

## 2021-11-09 DIAGNOSIS — I4891 Unspecified atrial fibrillation: Secondary | ICD-10-CM | POA: Diagnosis present

## 2021-11-09 DIAGNOSIS — K8020 Calculus of gallbladder without cholecystitis without obstruction: Secondary | ICD-10-CM

## 2021-11-09 DIAGNOSIS — E876 Hypokalemia: Secondary | ICD-10-CM | POA: Diagnosis not present

## 2021-11-09 DIAGNOSIS — E1169 Type 2 diabetes mellitus with other specified complication: Secondary | ICD-10-CM | POA: Diagnosis present

## 2021-11-09 DIAGNOSIS — R109 Unspecified abdominal pain: Principal | ICD-10-CM

## 2021-11-09 DIAGNOSIS — Z833 Family history of diabetes mellitus: Secondary | ICD-10-CM

## 2021-11-09 DIAGNOSIS — Z87891 Personal history of nicotine dependence: Secondary | ICD-10-CM

## 2021-11-09 DIAGNOSIS — Z993 Dependence on wheelchair: Secondary | ICD-10-CM | POA: Diagnosis not present

## 2021-11-09 DIAGNOSIS — R0602 Shortness of breath: Secondary | ICD-10-CM | POA: Diagnosis not present

## 2021-11-09 DIAGNOSIS — Z8639 Personal history of other endocrine, nutritional and metabolic disease: Secondary | ICD-10-CM

## 2021-11-09 DIAGNOSIS — R52 Pain, unspecified: Secondary | ICD-10-CM | POA: Diagnosis not present

## 2021-11-09 DIAGNOSIS — Z888 Allergy status to other drugs, medicaments and biological substances status: Secondary | ICD-10-CM

## 2021-11-09 DIAGNOSIS — R1013 Epigastric pain: Secondary | ICD-10-CM | POA: Diagnosis not present

## 2021-11-09 DIAGNOSIS — R7401 Elevation of levels of liver transaminase levels: Secondary | ICD-10-CM | POA: Diagnosis not present

## 2021-11-09 DIAGNOSIS — Z794 Long term (current) use of insulin: Secondary | ICD-10-CM

## 2021-11-09 DIAGNOSIS — K0889 Other specified disorders of teeth and supporting structures: Secondary | ICD-10-CM | POA: Diagnosis present

## 2021-11-09 DIAGNOSIS — M25569 Pain in unspecified knee: Secondary | ICD-10-CM

## 2021-11-09 DIAGNOSIS — Z79890 Hormone replacement therapy (postmenopausal): Secondary | ICD-10-CM

## 2021-11-09 DIAGNOSIS — Z86010 Personal history of colonic polyps: Secondary | ICD-10-CM

## 2021-11-09 DIAGNOSIS — Z96652 Presence of left artificial knee joint: Secondary | ICD-10-CM | POA: Diagnosis present

## 2021-11-09 DIAGNOSIS — Z7901 Long term (current) use of anticoagulants: Secondary | ICD-10-CM

## 2021-11-09 DIAGNOSIS — Z9851 Tubal ligation status: Secondary | ICD-10-CM

## 2021-11-09 DIAGNOSIS — R7989 Other specified abnormal findings of blood chemistry: Secondary | ICD-10-CM | POA: Diagnosis present

## 2021-11-09 DIAGNOSIS — Z9989 Dependence on other enabling machines and devices: Secondary | ICD-10-CM | POA: Diagnosis not present

## 2021-11-09 DIAGNOSIS — Z79899 Other long term (current) drug therapy: Secondary | ICD-10-CM

## 2021-11-09 DIAGNOSIS — Z853 Personal history of malignant neoplasm of breast: Secondary | ICD-10-CM

## 2021-11-09 DIAGNOSIS — G4733 Obstructive sleep apnea (adult) (pediatric): Secondary | ICD-10-CM

## 2021-11-09 DIAGNOSIS — R279 Unspecified lack of coordination: Secondary | ICD-10-CM | POA: Diagnosis not present

## 2021-11-09 DIAGNOSIS — Z825 Family history of asthma and other chronic lower respiratory diseases: Secondary | ICD-10-CM

## 2021-11-09 DIAGNOSIS — K820 Obstruction of gallbladder: Secondary | ICD-10-CM | POA: Diagnosis not present

## 2021-11-09 DIAGNOSIS — Z7984 Long term (current) use of oral hypoglycemic drugs: Secondary | ICD-10-CM

## 2021-11-09 DIAGNOSIS — Z7985 Long-term (current) use of injectable non-insulin antidiabetic drugs: Secondary | ICD-10-CM

## 2021-11-09 DIAGNOSIS — R748 Abnormal levels of other serum enzymes: Secondary | ICD-10-CM | POA: Diagnosis not present

## 2021-11-09 DIAGNOSIS — M25562 Pain in left knee: Secondary | ICD-10-CM | POA: Diagnosis not present

## 2021-11-09 DIAGNOSIS — M170 Bilateral primary osteoarthritis of knee: Secondary | ICD-10-CM | POA: Diagnosis present

## 2021-11-09 DIAGNOSIS — M25561 Pain in right knee: Secondary | ICD-10-CM | POA: Diagnosis not present

## 2021-11-09 DIAGNOSIS — N281 Cyst of kidney, acquired: Secondary | ICD-10-CM | POA: Diagnosis not present

## 2021-11-09 LAB — CBC WITH DIFFERENTIAL/PLATELET
Abs Immature Granulocytes: 0.03 10*3/uL (ref 0.00–0.07)
Basophils Absolute: 0 10*3/uL (ref 0.0–0.1)
Basophils Relative: 0 %
Eosinophils Absolute: 0 10*3/uL (ref 0.0–0.5)
Eosinophils Relative: 0 %
HCT: 41 % (ref 36.0–46.0)
Hemoglobin: 13.7 g/dL (ref 12.0–15.0)
Immature Granulocytes: 0 %
Lymphocytes Relative: 10 %
Lymphs Abs: 0.9 10*3/uL (ref 0.7–4.0)
MCH: 27.7 pg (ref 26.0–34.0)
MCHC: 33.4 g/dL (ref 30.0–36.0)
MCV: 82.8 fL (ref 80.0–100.0)
Monocytes Absolute: 0.5 10*3/uL (ref 0.1–1.0)
Monocytes Relative: 6 %
Neutro Abs: 6.8 10*3/uL (ref 1.7–7.7)
Neutrophils Relative %: 84 %
Platelets: 255 10*3/uL (ref 150–400)
RBC: 4.95 MIL/uL (ref 3.87–5.11)
RDW: 16.9 % — ABNORMAL HIGH (ref 11.5–15.5)
WBC: 8.2 10*3/uL (ref 4.0–10.5)
nRBC: 0 % (ref 0.0–0.2)

## 2021-11-09 LAB — COMPREHENSIVE METABOLIC PANEL
ALT: 911 U/L — ABNORMAL HIGH (ref 0–44)
AST: 1494 U/L — ABNORMAL HIGH (ref 15–41)
Albumin: 4.1 g/dL (ref 3.5–5.0)
Alkaline Phosphatase: 227 U/L — ABNORMAL HIGH (ref 38–126)
Anion gap: 12 (ref 5–15)
BUN: 13 mg/dL (ref 8–23)
CO2: 28 mmol/L (ref 22–32)
Calcium: 9.5 mg/dL (ref 8.9–10.3)
Chloride: 98 mmol/L (ref 98–111)
Creatinine, Ser: 0.98 mg/dL (ref 0.44–1.00)
GFR, Estimated: >60 mL/min (ref >60)
Glucose, Bld: 219 mg/dL — ABNORMAL HIGH (ref 70–99)
Potassium: 3.8 mmol/L (ref 3.5–5.1)
Sodium: 138 mmol/L (ref 135–145)
Total Bilirubin: 2.7 mg/dL — ABNORMAL HIGH (ref 0.3–1.2)
Total Protein: 8.7 g/dL — ABNORMAL HIGH (ref 6.5–8.1)

## 2021-11-09 LAB — LIPASE, BLOOD: Lipase: 43 U/L (ref 11–51)

## 2021-11-09 MED ORDER — INSULIN ASPART 100 UNIT/ML IJ SOLN
0.0000 [IU] | Freq: Four times a day (QID) | INTRAMUSCULAR | Status: DC
  Administered 2021-11-10: 2 [IU] via SUBCUTANEOUS
  Administered 2021-11-10 (×2): 3 [IU] via SUBCUTANEOUS
  Administered 2021-11-10: 5 [IU] via SUBCUTANEOUS
  Administered 2021-11-11 (×2): 3 [IU] via SUBCUTANEOUS
  Administered 2021-11-12 – 2021-11-13 (×4): 2 [IU] via SUBCUTANEOUS
  Administered 2021-11-13 – 2021-11-14 (×3): 3 [IU] via SUBCUTANEOUS
  Administered 2021-11-14: 5 [IU] via SUBCUTANEOUS
  Administered 2021-11-14: 3 [IU] via SUBCUTANEOUS
  Administered 2021-11-14: 5 [IU] via SUBCUTANEOUS
  Administered 2021-11-14 – 2021-11-15 (×3): 3 [IU] via SUBCUTANEOUS
  Administered 2021-11-15 – 2021-11-16 (×3): 5 [IU] via SUBCUTANEOUS
  Administered 2021-11-16 – 2021-11-17 (×2): 3 [IU] via SUBCUTANEOUS

## 2021-11-09 MED ORDER — ONDANSETRON HCL 4 MG/2ML IJ SOLN
INTRAMUSCULAR | Status: AC
  Filled 2021-11-09: qty 2

## 2021-11-09 MED ORDER — PIPERACILLIN-TAZOBACTAM 3.375 G IVPB 30 MIN
3.3750 g | Freq: Once | INTRAVENOUS | Status: AC
  Administered 2021-11-09: 3.375 g via INTRAVENOUS
  Filled 2021-11-09: qty 50

## 2021-11-09 MED ORDER — ONDANSETRON HCL 4 MG/2ML IJ SOLN
4.0000 mg | Freq: Once | INTRAMUSCULAR | Status: AC
  Administered 2021-11-09: 4 mg via INTRAVENOUS
  Filled 2021-11-09: qty 2

## 2021-11-09 MED ORDER — MORPHINE SULFATE (PF) 4 MG/ML IV SOLN
4.0000 mg | Freq: Once | INTRAVENOUS | Status: AC
  Administered 2021-11-09: 4 mg via INTRAVENOUS
  Filled 2021-11-09: qty 1

## 2021-11-09 MED ORDER — IOHEXOL 300 MG/ML  SOLN
75.0000 mL | Freq: Once | INTRAMUSCULAR | Status: AC | PRN
  Administered 2021-11-09: 75 mL via INTRAVENOUS

## 2021-11-09 MED ORDER — SODIUM CHLORIDE 0.9 % IV BOLUS
500.0000 mL | Freq: Once | INTRAVENOUS | Status: AC
  Administered 2021-11-09: 500 mL via INTRAVENOUS

## 2021-11-09 MED ORDER — INSULIN ASPART 100 UNIT/ML IJ SOLN
0.0000 [IU] | Freq: Every day | INTRAMUSCULAR | Status: DC
  Administered 2021-11-15: 2 [IU] via SUBCUTANEOUS

## 2021-11-09 NOTE — ED Notes (Signed)
Pts daughter approached this RN following Triage completion to inform this RN that she had requested Pt be transported to [facility] to receive care, as she reports [facility] staff and RNs upstairs have always been rude to her mother in previous visits and said inappropriate comments. This RN assured the daughter we would take very good care of her mother while she is here and wishes the Pt would feel better. Pts daughter informed this RN she works with [employer] for [facility] and knows how her mother should be treated.

## 2021-11-09 NOTE — ED Provider Notes (Signed)
[facility] EMERGENCY DEPARTMENT Provider Note   CSN: [id number] Arrival date & time: 11/09/21  1956     History  Chief Complaint  Patient presents with   Abdominal Pain   HPI [name] is a 66 y.o. female with hypertension, type 2 diabetes, asthma, breast cancer now in remission for the last 8 years per patient presenting for abdominal pain.  Pain started at 4:30 AM.  It was all of a sudden and awoke her from sleep.  Pain is located in epigastric region and at times radiates to her right shoulder.  Pain feels like a burning sensation.  It has been constant for the last 3 hours.  Endorses a subjective fever nausea, vomiting but denies diarrhea.  Denies hematemesis.  Took an enema this morning for constipation.  Stated that she had a normal bowel movement later today.  Has taken Protonix and Mylanta today for her symptoms.   Abdominal Pain      Home Medications Prior to Admission medications   Medication Sig Start Date End Date Taking? Authorizing Provider  albuterol (PROVENTIL) (2.5 MG/3ML) 0.083% nebulizer solution USE 1 VIAL IN NEBULIZER EVERY 6 HOURS AS NEEDED FOR WHEEZING OR SHORTNESS OF BREATH. Patient taking differently: Take 2.5 mg by nebulization every 6 (six) hours as needed for wheezing or shortness of breath. 02/06/21  Yes Cook, Jayce G, DO  albuterol (VENTOLIN HFA) 108 (90 Base) MCG/ACT inhaler INHALE 2 PUFFS INTO THE LUNGS EVERY 6 HOURS AS NEEDED FOR SHORTNESS OF BREATH OR WHEEZING. Patient taking differently: Inhale 2 puffs into the lungs every 6 (six) hours as needed for wheezing or shortness of breath. 01/01/21  Yes Cook, Jayce G, DO  amLODipine (NORVASC) 10 MG tablet TAKE (1) TABLET BY MOUTH ONCE DAILY. Patient taking differently: Take 10 mg by mouth daily. 04/22/21  Yes Cook, Jayce G, DO  benazepril (LOTENSIN) 40 MG tablet TAKE (1) TABLET BY MOUTH ONCE DAILY. Patient taking differently: Take 40 mg by mouth daily. 07/08/21  Yes Cook, Jayce G, DO  Dulaglutide  (TRULICITY) 4.01 UU/7.2ZD SOPN Inject 0.75 mg into the skin once a week. 02/24/20  Yes [provider]  ELIQUIS 5 MG TABS tablet TAKE (1) TABLET BY MOUTH TWICE DAILY. Patient taking differently: Take 5 mg by mouth 2 (two) times daily. 06/11/21  Yes Cook, Jayce G, DO  fluticasone (FLOVENT HFA) 44 MCG/ACT inhaler Inhale 2 puffs into the lungs 2 (two) times daily. 06/08/18  Yes Mikey Kirschner, MD  HUMALOG MIX 75/25 KWIKPEN (75-25) 100 UNIT/ML Kwikpen Inject 12 Units into the skin in the morning and at bedtime. 09/24/15  Yes [provider]  Incontinence Supply Disposable (DEPEND UNDERWEAR X-LARGE) MISC Use daily as needed. 10/31/20  Yes Cook, Jayce G, DO  Insulin Pen Needle 31G X 8 MM MISC Use to inject insulin one time daily 02/21/13  Yes [provider]  levothyroxine (SYNTHROID) 100 MCG tablet Take 100 mcg by mouth daily before breakfast.   Yes [provider]  metoprolol succinate (TOPROL-XL) 100 MG 24 hr tablet Take 1 tablet (100 mg total) by mouth daily. Take with or immediately following a meal. 10/11/21  Yes Cook, Barlow G, DO  ONETOUCH ULTRA test strip USE TO TEST BLOOD SUGAR 4 TIMES DAILY AS DIRECTED. 06/18/20  Yes Luking, Elayne Snare, MD  polyethylene glycol (MIRALAX / GLYCOLAX) 17 g packet Take 17 g by mouth daily as needed for mild constipation. 09/04/21  Yes Shah, Pratik D, DO  pregabalin (LYRICA) 100 MG capsule  Take 1 capsule (100 mg total) by mouth 3 (three) times daily. 09/09/21  Yes Danford, Suann Larry, MD  traMADol (ULTRAM) 50 MG tablet Take 1-1.5 tablets (50-75 mg total) by mouth every 8 (eight) hours as needed for moderate pain or severe pain. 11/06/21  Yes Cook, Jayce G, DO  pantoprazole (PROTONIX) 40 MG tablet Take 1 tablet (40 mg total) by mouth 2 (two) times daily before a meal. 09/04/21 10/04/21  Manuella Ghazi, Pratik D, DO  triamcinolone cream (KENALOG) 0.1 % Apply 1 Application topically 2 (two) times daily. To affected area 10/14/21   Coral Spikes, DO       Allergies    Procardia [nifedipine], Aspirin, and Diltiazem    Review of Systems   Review of Systems  Gastrointestinal:  Positive for abdominal pain.    Physical Exam Updated Vital Signs BP (!) 157/89 (BP Location: Left Arm)   Pulse (!) 106   Temp (!) 100.4 F (38 C) (Oral)   Resp 19   Ht 5' 4.5" (1.638 m)   Wt 135.2 kg   SpO2 95%   BMI 50.36 kg/m  Physical Exam Vitals and nursing note reviewed.  Constitutional:      Appearance: She is obese. She is ill-appearing.  HENT:     Head: Normocephalic and atraumatic.     Mouth/Throat:     Mouth: Mucous membranes are moist.  Eyes:     General:        Right eye: No discharge.        Left eye: No discharge.     Conjunctiva/sclera: Conjunctivae normal.  Cardiovascular:     Rate and Rhythm: Regular rhythm. Tachycardia present.     Pulses: Normal pulses.     Heart sounds: Normal heart sounds.  Pulmonary:     Effort: Pulmonary effort is normal.     Breath sounds: Normal breath sounds.  Abdominal:     General: Abdomen is flat.     Palpations: Abdomen is soft.     Tenderness: There is abdominal tenderness in the epigastric area. There is no right CVA tenderness or left CVA tenderness.  Skin:    General: Skin is warm and dry.  Neurological:     General: No focal deficit present.  Psychiatric:        Mood and Affect: Mood normal.     ED Results / Procedures / Treatments   Labs (all labs ordered are listed, but only abnormal results are displayed) Labs Reviewed  CBC WITH DIFFERENTIAL/PLATELET - Abnormal; Notable for the following components:      Result Value   RDW 16.9 (*)    All other components within normal limits  COMPREHENSIVE METABOLIC PANEL - Abnormal; Notable for the following components:   Glucose, Bld 219 (*)    Total Protein 8.7 (*)    AST 1,494 (*)    ALT 911 (*)    Alkaline Phosphatase 227 (*)    Total Bilirubin 2.7 (*)    All other components within normal limits  LIPASE, BLOOD  URINALYSIS,  ROUTINE W REFLEX MICROSCOPIC    EKG EKG Interpretation  Date/Time:  Saturday November 09 2021 20:45:35 EDT Ventricular Rate:  116 PR Interval:  196 QRS Duration: 85 QT Interval:  301 QTC Calculation: 419 R Axis:   -9 Text Interpretation: Sinus tachycardia Multiform ventricular premature complexes Low voltage, precordial leads Anteroseptal infarct, old Nonspecific T abnormalities, lateral leads Confirmed by Milton Ferguson [phone]) on 11/09/2021 10:40:58 PM  Radiology CT Abdomen Pelvis W Contrast  Result Date: 11/09/2021 CLINICAL DATA:  Acute abdominal pain. EXAM: CT ABDOMEN AND PELVIS WITH CONTRAST TECHNIQUE: Multidetector CT imaging of the abdomen and pelvis was performed using the standard protocol following bolus administration of intravenous contrast. RADIATION DOSE REDUCTION: This exam was performed according to the departmental dose-optimization program which includes automated exposure control, adjustment of the mA and/or kV according to patient size and/or use of iterative reconstruction technique. CONTRAST:  14m OMNIPAQUE IOHEXOL 300 MG/ML  SOLN COMPARISON:  CT abdomen and pelvis 08/31/2021 FINDINGS: Lower chest: No acute abnormality. Hepatobiliary: No focal liver abnormality is seen. No gallstones, gallbladder wall thickening, or biliary dilatation. Pancreas: Unremarkable. No pancreatic ductal dilatation or surrounding inflammatory changes. Spleen: Normal in size without focal abnormality. Adrenals/Urinary Tract: There is a 2.5 cm left renal cyst, unchanged. Otherwise, the kidneys, adrenal glands and bladder are within normal limits. Stomach/Bowel: Stomach is within normal limits. Appendix appears normal. No evidence of bowel wall thickening, distention, or inflammatory changes. Vascular/Lymphatic: Aortic atherosclerosis. No enlarged abdominal or pelvic lymph nodes. Reproductive: Calcified uterine fibroid seen measuring 1 cm. No adnexal mass. Other: No abdominal wall hernia or abnormality.  No abdominopelvic ascites. Musculoskeletal: Degenerative changes affect the spine. IMPRESSION: 1. No acute localizing process in the abdomen or pelvis. 2. Left Bosniak I benign renal cyst measuring 2.5 cm. No follow-up imaging is recommended. JACR 2018 Feb; 264-273, Management of the Incidental Renal Mass on CT, [employer] 2021; 814-848, Bosniak Classification of Cystic Renal Masses, Version 2019. 3. Calcified uterine fibroid. Aortic Atherosclerosis (ICD10-I70.0). Electronically Signed   By: ARonney AstersM.D.   On: 11/09/2021 22:41   DG Chest Portable 1 View  Result Date: 11/09/2021 CLINICAL DATA:  Shortness of breath EXAM: PORTABLE CHEST 1 VIEW COMPARISON:  Chest x-ray 04/08/2015 FINDINGS: There is some atelectatic changes in the lower lungs. There is no lung consolidation, pleural effusion or pneumothorax. Cardiomediastinal silhouette is within normal limits. Surgical clips overlie the lower neck, unchanged. No acute fractures. IMPRESSION: No active disease. Atelectatic changes in the lower lungs. Electronically Signed   By: ARonney AstersM.D.   On: 11/09/2021 22:10    Procedures Procedures    Medications Ordered in ED Medications  ondansetron (ZOFRAN) injection 4 mg (4 mg Intravenous Given 11/09/21 2052)  morphine (PF) 4 MG/ML injection 4 mg (4 mg Intravenous Given 11/09/21 2053)  iohexol (OMNIPAQUE) 300 MG/ML solution 75 mL (75 mLs Intravenous Contrast Given 11/09/21 2209)  piperacillin-tazobactam (ZOSYN) IVPB 3.375 g (0 g Intravenous Stopped 11/09/21 2206)  sodium chloride 0.9 % bolus 500 mL (0 mLs Intravenous Stopped 11/09/21 2307)    ED Course/ Medical Decision Making/ A&P                           Medical Decision Making Amount and/or Complexity of Data Reviewed Labs: ordered. Radiology: ordered.  Risk Prescription drug management.   This patient presents to the ED for concern of abdominal pain, this involves a number of treatment options, and is a complaint that carries  with it a high risk of complications and morbidity.  The differential diagnosis includes gallbladder pathology, pancreatitis, gastritis,  ACS, and PE.    Co morbidities: Discussed in HPI   EMR reviewed including pt PMHx, past surgical history and past visits to ER.   See HPI for more details   Lab Tests:   I ordered and independently interpreted labs. Labs notable for elevated liver enzymes, hyperglycemia   Imaging Studies:  NAD. I personally reviewed  all imaging studies and no acute abnormality found. I agree with radiology interpretation.    Cardiac Monitoring:  The patient was maintained on a cardiac monitor.  I personally viewed and interpreted the cardiac monitored which showed an underlying rhythm of: Sinus tachycardia EKG non-ischemic   Medicines ordered:  I ordered medication including Zofran for nausea, morphine for pain, Zosyn to treat empirically for acute cholangitis and/or acute cholecystitis given fever and elevated liver enzymes.  Given NS bolus for volume resuscitation. Reevaluation of the patient after these medicines showed that the patient stayed the same I have reviewed the patients home medicines and have made adjustments as needed    Consults/Attending Physician   I requested consultation with Dr. Milton Ferguson,  and discussed lab and imaging findings as well as pertinent plan - they recommend: Admission to the hospital for evaluation for acute hepatitis secondary to biliary obstruction versus acute viral hepatitis.   Reevaluation:  After the interventions noted above I re-evaluated patient and found that they have :improved    Problem List / ED Course: Resenting to the emergency department for abdominal pain.  On exam she was tender in the epigastric region, had a fever and was tachycardic.  CMP also revealed elevated liver enzymes.  Initially concern for cholangitis or acute cholecystitis but unlikely given reassuring CT of the abdomen pelvis.   Treated pain with morphine and Zofran for nausea.  Upon reevaluation patient stated that her symptoms were much improved.  Also treated her with normal saline bolus for volume resuscitation.  Consulted GI, Dr. Gala Romney mentioned that while CT is negative for acute cholecystitis or cholangitis it could possibly be a obstructive process of some kind leading to acute hepatitis.  Also at this time stated cannot rule out viral hepatitis.  Advised to admit to the hospital for further evaluation for acute hepatitis.   Dispostion:  After consideration of the diagnostic results and the patients response to treatment, I feel that the patient would benefit from admission to the hospital for evaluation of possible acute hepatitis.         Final Clinical Impression(s) / ED Diagnoses Final diagnoses:  Abdominal pain, unspecified abdominal location    Rx / DC Orders ED Discharge Orders     None         Harriet Pho, PA-C 11/09/21 2313    Milton Ferguson, MD 11/12/21 1017

## 2021-11-09 NOTE — ED Notes (Signed)
Pt reports pain has improved and she is feeling better.

## 2021-11-09 NOTE — ED Triage Notes (Signed)
Pt arrived via [facility] from home c/o epigastric abdominal pain. Pt reports pain began this morning. Pt non-ambulatory and gets around with her [employer] at home. Pt reports severe pain in her legs. Pt endorses Hx of Gastritis and took her prescribed medication for this w/o relief from symptoms.

## 2021-11-09 NOTE — ED Notes (Signed)
PT moved to stretcher from wheelchair at this time. Tolerated ok. She is now being transported to CT scan at this time.

## 2021-11-10 ENCOUNTER — Encounter (HOSPITAL_COMMUNITY): Payer: Self-pay | Admitting: Family Medicine

## 2021-11-10 ENCOUNTER — Inpatient Hospital Stay (HOSPITAL_COMMUNITY): Payer: Medicare Other

## 2021-11-10 DIAGNOSIS — G4733 Obstructive sleep apnea (adult) (pediatric): Secondary | ICD-10-CM

## 2021-11-10 DIAGNOSIS — M25562 Pain in left knee: Secondary | ICD-10-CM

## 2021-11-10 DIAGNOSIS — I10 Essential (primary) hypertension: Secondary | ICD-10-CM | POA: Diagnosis not present

## 2021-11-10 DIAGNOSIS — K8020 Calculus of gallbladder without cholecystitis without obstruction: Secondary | ICD-10-CM

## 2021-11-10 DIAGNOSIS — E039 Hypothyroidism, unspecified: Secondary | ICD-10-CM | POA: Diagnosis not present

## 2021-11-10 DIAGNOSIS — R6510 Systemic inflammatory response syndrome (SIRS) of non-infectious origin without acute organ dysfunction: Secondary | ICD-10-CM

## 2021-11-10 DIAGNOSIS — M25561 Pain in right knee: Secondary | ICD-10-CM

## 2021-11-10 DIAGNOSIS — R7401 Elevation of levels of liver transaminase levels: Secondary | ICD-10-CM

## 2021-11-10 DIAGNOSIS — M25569 Pain in unspecified knee: Secondary | ICD-10-CM

## 2021-11-10 DIAGNOSIS — E785 Hyperlipidemia, unspecified: Secondary | ICD-10-CM

## 2021-11-10 DIAGNOSIS — E1169 Type 2 diabetes mellitus with other specified complication: Secondary | ICD-10-CM | POA: Diagnosis not present

## 2021-11-10 DIAGNOSIS — G8929 Other chronic pain: Secondary | ICD-10-CM

## 2021-11-10 LAB — COMPREHENSIVE METABOLIC PANEL
ALT: 602 U/L — ABNORMAL HIGH (ref 0–44)
AST: 769 U/L — ABNORMAL HIGH (ref 15–41)
Albumin: 3 g/dL — ABNORMAL LOW (ref 3.5–5.0)
Alkaline Phosphatase: 162 U/L — ABNORMAL HIGH (ref 38–126)
Anion gap: 7 (ref 5–15)
BUN: 11 mg/dL (ref 8–23)
CO2: 25 mmol/L (ref 22–32)
Calcium: 7.8 mg/dL — ABNORMAL LOW (ref 8.9–10.3)
Chloride: 107 mmol/L (ref 98–111)
Creatinine, Ser: 0.86 mg/dL (ref 0.44–1.00)
GFR, Estimated: >60 mL/min (ref >60)
Glucose, Bld: 162 mg/dL — ABNORMAL HIGH (ref 70–99)
Potassium: 3.2 mmol/L — ABNORMAL LOW (ref 3.5–5.1)
Sodium: 139 mmol/L (ref 135–145)
Total Bilirubin: 2.4 mg/dL — ABNORMAL HIGH (ref 0.3–1.2)
Total Protein: 6.1 g/dL — ABNORMAL LOW (ref 6.5–8.1)

## 2021-11-10 LAB — GLUCOSE, CAPILLARY
Glucose-Capillary: 176 mg/dL — ABNORMAL HIGH (ref 70–99)
Glucose-Capillary: 193 mg/dL — ABNORMAL HIGH (ref 70–99)
Glucose-Capillary: 204 mg/dL — ABNORMAL HIGH (ref 70–99)
Glucose-Capillary: 148 mg/dL — ABNORMAL HIGH (ref 70–99)
Glucose-Capillary: 153 mg/dL — ABNORMAL HIGH (ref 70–99)

## 2021-11-10 LAB — HEPATITIS PANEL, ACUTE
HCV Ab: NONREACTIVE
Hep A IgM: NONREACTIVE
Hep B C IgM: NONREACTIVE
Hepatitis B Surface Ag: NONREACTIVE

## 2021-11-10 LAB — MAGNESIUM: Magnesium: 1.9 mg/dL (ref 1.7–2.4)

## 2021-11-10 MED ORDER — PREGABALIN 50 MG PO CAPS
100.0000 mg | ORAL_CAPSULE | Freq: Three times a day (TID) | ORAL | Status: DC
  Administered 2021-11-10 – 2021-11-15 (×17): 100 mg via ORAL
  Filled 2021-11-10 (×18): qty 2

## 2021-11-10 MED ORDER — BENAZEPRIL HCL 20 MG PO TABS
40.0000 mg | ORAL_TABLET | Freq: Every day | ORAL | Status: DC
  Filled 2021-11-10: qty 2

## 2021-11-10 MED ORDER — ONDANSETRON HCL 4 MG PO TABS: 4.0000 mg | ORAL_TABLET | Freq: Four times a day (QID) | ORAL | Status: DC | PRN

## 2021-11-10 MED ORDER — APIXABAN 5 MG PO TABS
5.0000 mg | ORAL_TABLET | Freq: Two times a day (BID) | ORAL | Status: DC
  Administered 2021-11-10 (×2): 5 mg via ORAL
  Filled 2021-11-10 (×2): qty 1

## 2021-11-10 MED ORDER — AMLODIPINE BESYLATE 5 MG PO TABS
10.0000 mg | ORAL_TABLET | Freq: Every day | ORAL | Status: DC
  Filled 2021-11-10: qty 2

## 2021-11-10 MED ORDER — ACETAMINOPHEN 325 MG PO TABS
650.0000 mg | ORAL_TABLET | Freq: Four times a day (QID) | ORAL | Status: DC | PRN
  Administered 2021-11-10 – 2021-11-15 (×4): 650 mg via ORAL
  Filled 2021-11-10 (×4): qty 2

## 2021-11-10 MED ORDER — METOPROLOL SUCCINATE ER 50 MG PO TB24
100.0000 mg | ORAL_TABLET | Freq: Every day | ORAL | Status: DC
  Administered 2021-11-10 – 2021-11-16 (×6): 100 mg via ORAL
  Filled 2021-11-10 (×7): qty 2

## 2021-11-10 MED ORDER — PIPERACILLIN-TAZOBACTAM 3.375 G IVPB
3.3750 g | Freq: Three times a day (TID) | INTRAVENOUS | Status: DC
  Administered 2021-11-10 – 2021-11-15 (×15): 3.375 g via INTRAVENOUS
  Filled 2021-11-10 (×15): qty 50

## 2021-11-10 MED ORDER — ALBUTEROL SULFATE (2.5 MG/3ML) 0.083% IN NEBU
2.5000 mg | INHALATION_SOLUTION | Freq: Four times a day (QID) | RESPIRATORY_TRACT | Status: DC | PRN
  Administered 2021-11-11 – 2021-11-13 (×2): 2.5 mg via RESPIRATORY_TRACT
  Filled 2021-11-10 (×2): qty 3

## 2021-11-10 MED ORDER — ACETAMINOPHEN 650 MG RE SUPP: 650.0000 mg | Freq: Four times a day (QID) | RECTAL | Status: DC | PRN

## 2021-11-10 MED ORDER — ONDANSETRON HCL 4 MG/2ML IJ SOLN: 4.0000 mg | Freq: Four times a day (QID) | INTRAMUSCULAR | Status: DC | PRN

## 2021-11-10 MED ORDER — PANTOPRAZOLE SODIUM 40 MG PO TBEC
40.0000 mg | DELAYED_RELEASE_TABLET | Freq: Two times a day (BID) | ORAL | Status: DC
  Administered 2021-11-10 – 2021-11-16 (×13): 40 mg via ORAL
  Filled 2021-11-10 (×14): qty 1

## 2021-11-10 MED ORDER — OXYCODONE HCL 5 MG PO TABS
5.0000 mg | ORAL_TABLET | ORAL | Status: DC | PRN
  Administered 2021-11-10 – 2021-11-16 (×19): 5 mg via ORAL
  Filled 2021-11-10 (×20): qty 1

## 2021-11-10 MED ORDER — LEVOTHYROXINE SODIUM 100 MCG PO TABS
100.0000 ug | ORAL_TABLET | Freq: Every day | ORAL | Status: DC
  Administered 2021-11-10 – 2021-11-16 (×6): 100 ug via ORAL
  Filled 2021-11-10 (×6): qty 1

## 2021-11-10 MED ORDER — SODIUM CHLORIDE 0.9 % IV SOLN: INTRAVENOUS | Status: DC

## 2021-11-10 NOTE — Assessment & Plan Note (Signed)
CPAP ordered

## 2021-11-10 NOTE — Progress Notes (Signed)
Nurse called states family does not wish to use this CPAP machine. Nurse has pulled it aside. This is not surprising as patient is difficult to deal with.

## 2021-11-10 NOTE — Assessment & Plan Note (Signed)
-   Continue amlodipine, benazepril, metoprolol - Blood pressure in the ED 157/89 - Continue to monitor

## 2021-11-10 NOTE — Progress Notes (Signed)
Patient states her CPAP setting  is 4 . I have many doubts about this , as it sounds more like a humidifier setting. No settings available in old sleep study. Placed on CPAP 4 with 3 liters oxygen bled in.

## 2021-11-10 NOTE — TOC Progression Note (Signed)
  Transition of Care [facility]) Screening Note   Patient Details  Name: [name] Date of Birth: [birth year]   Transition of Care [facility]) CM/SW Contact:    Boneta Lucks, RN Phone Number: 11/10/2021, 1:55 PM  GI following  Transition of Care Department [facility]) has reviewed patient and no TOC needs have been identified at this time. We will continue to monitor patient advancement through interdisciplinary progression rounds. If new patient transition needs arise, please place a TOC consult.      Barriers to Discharge: Continued Medical Work up                             0

## 2021-11-10 NOTE — Assessment & Plan Note (Signed)
-   Hyperglycemic in the ED at 219 - Patient takes 12 units of 70/30 at baseline - While in the hospital we will continue moderate dose sliding scale - Patient currently on clear liquids as she has had abdominal pain and nausea, and there is also possibility that she will need a procedure done

## 2021-11-10 NOTE — Progress Notes (Signed)
Patient admitted to floor and transferred over from stretcher to bed.  Skin assessment done, and skin is wnl.  Patient had on soiled disposable diaper.  Diaper removed.  Patient requested diaper to be replaced. Explained to patient that diapers were not kept on our floor,  we had a the external urinary catheters. Patient stated during her previous admission she had a poor experience with the purewick and it is the current reason as to why she is unable to walk.  Sent staff to ed to procure diapers for patient.  Diapers were brought back and placed in patient room for patient use.  At this time, patient herself agreed for purewick to be used. NT placed purewick per patient request.

## 2021-11-10 NOTE — Assessment & Plan Note (Addendum)
-   Acute transaminitis -3 days ago AST 21, ALT 15 - Today AST 1494, ALT 911 - T. bili up to 2.7 from 0.3 - Patient has had transaminitis before with AST up to 392 and ALT up to 269 at which time viral hepatitis was ruled out and patient had clinical improvement which led to discharge without MRCP - CT abdomen pelvis showed no acute liver abnormality - GI was consulted and recommends trending transaminases in the a.m., and considering right upper quadrant ultrasound - GI will see in the a.m. - Hold hepatotoxic agents when possible - Acute hepatitis panel - Continue to monitor

## 2021-11-10 NOTE — Assessment & Plan Note (Signed)
Continue Synthroid °

## 2021-11-10 NOTE — Assessment & Plan Note (Signed)
-   Bilateral knee pain with known osteoarthritis - Pain scale for pain control

## 2021-11-10 NOTE — Progress Notes (Signed)
Patient admitted to the hospital earlier this morning by Dr. Clearence Ped  Patient seen and examined.  Feels that overall abdominal pain is better.  She does not have any nausea or vomiting.  Assessment/plan  Transaminitis -Concern for recurrent choledocholithiasis -CT imaging and ultrasound imaging did not reveal any stones and bile duct or bile duct dilatation -She is noted to have cholelithiasis -Overall LFTs are trending down since admission -GI following, appreciate input -Continue IV fluids and recheck labs in a.m. -We will request general surgery input since she may need cholecystectomy   SIRS -Noted to be febrile, tachycardic and tachypneic -Started on broad-spectrum antibiotics -Follow-up blood cultures -Overall hemodynamics are improving  Type 2 diabetes -Continued on 70/30 insulin -Continue sliding scale -Blood sugar stable  Atrial flutter -Continue on metoprolol -She is on chronic anticoagulation with Eliquis.  Since she may need surgery, will hold off on further Eliquis for now.  Last dose of Eliquis was a.m. of 10/29  Hypertension -Blood pressures have run on the lower side today -Holding amlodipine and benazepril -Continue metoprolol for now  Bilateral osteoarthritis of knees -Severely debilitating, mostly wheelchair-bound now -Wishes to pursue operative management, but will need to lose weight prior to being considered for knee replacements  Obstructive sleep apnea -Continue CPAP  Hypothyroidism -Continue Synthroid  Obesity, class III -BMI 50.3 -Discussed the importance of healthy eating habits and physical activity

## 2021-11-10 NOTE — Consult Note (Signed)
Referring Provider: No ref. provider found Primary Care Physician:  Coral Spikes, DO Primary Gastroenterologist:  Dr.Chania Kochanski  Reason for Consultation: "Hepatitis"  HPI: 66 year old lady with morbid obesity GERD hyperlipidemia, insulin-dependent diabetes mellitus sleep apnea admitted with recurrent abdominal pain.  Developed epigastric /right upper quadrant abdominal pain yesterday which became severe causing her to come to the emergency department.  In the ED, she was found to have markedly elevated LFTs (AST 1494, ALT 911, alkaline phosphatase 227 bilirubin 2.4; today (AST 769/ALT 602/alkaline phosphatase 162 total bilirubin 2.4.  CT yesterday negative for any pancreaticobiliary abnormalities or other acute process; Ultrasound this a.m. demonstrated cholelithiasis no obvious biliary dilation.  Study somewhat limited by abdominal girth.  This morning I came to see her she states her abdominal pain has totally resolved.  Similar episode 2 months ago as chronicled below.  Patient denies any chronic symptoms of nausea, vomiting, abdominal pain odynophagia, dysphagia or early satiety.  Denies melena or rectal bleeding.  She presented with similar pain and was admitted to the hospital 2 months ago.  On admission (8/19, she had completely normal LFTs and was noted to have completely normal LFTs going back 2 years. At that admission she had both a CT and an ultrasound.  Cholelithiasis called on both studies without biliary dilation or anything acute.  Consult obtained at that time.  It was felt the patient likely had some type of gastritis and an eventual EGD was recommended.  She was hospitalized proximately a week and a half normal or abdominal complaints.  Although LFTs were normal on admission 8/19 and they were repeated on 8/27 and 8/28 and her transaminases were noted to be in the 3-400 range.  Some discussion about pursuing an MRCP.  However, patient's body habitus make such a study challenge.   Abdominal pain did resolve.  Set of LFTs on 11/06/2021-completely normal.  She did well since August without pain until this admission.  Past Medical History:  Diagnosis Date   Asthma    prn neb. and inhaler   Breast cancer ([place]) 01/2013   right   Dehydration 04/13/2013   GERD (gastroesophageal reflux disease)    Graves' disease    Hyperlipidemia    Hypertension    on multiple meds., has been on med. > 14 yr.   Non-insulin dependent type 2 diabetes mellitus ([place])    Obesity    Ophthalmic manifestation of Graves disease    Osteoarthritis 2003   left knee   Radiation 10/11/13-11/29/13   Right Breast   Sleep apnea 4/16   mod   Wears glasses     Past Surgical History:  Procedure Laterality Date   CARDIOVERSION N/A 11/08/2013   Procedure: CARDIOVERSION;  Surgeon: Lelon Perla, MD;  Location: [facility] ENDOSCOPY;  Service: Cardiovascular;  Laterality: N/A;   COLONOSCOPY  2007   COLONOSCOPY W/ POLYPECTOMY  2009   EXCISION OF KELOID N/A 05/18/2014   Procedure: EXCISION OF KELOID;  Surgeon: Erroll Luna, MD;  Location: [place];  Service: General;  Laterality: N/A;   PORT-A-CATH REMOVAL Right 05/18/2014   Procedure: REMOVAL PORT-A-CATH;  Surgeon: Erroll Luna, MD;  Location: [place];  Service: General;  Laterality: Right;   PORTACATH PLACEMENT Right 02/17/2013   Procedure: INSERTION PORT-A-CATH;  Surgeon: Joyice Faster. Cornett, MD;  Location: [place];  Service: General;  Laterality: Right;   TEE WITHOUT CARDIOVERSION N/A 11/08/2013   Procedure: TRANSESOPHAGEAL ECHOCARDIOGRAM (TEE);  Surgeon: Lelon Perla, MD;  Location: [facility] ENDOSCOPY;  Service: Cardiovascular;  Laterality: N/A;   TOTAL KNEE ARTHROPLASTY Left 2003   TOTAL THYROIDECTOMY     TUBAL LIGATION  1985    Prior to Admission medications   Medication Sig Start Date End Date Taking? Authorizing Provider  albuterol (PROVENTIL) (2.5 MG/3ML) 0.083% nebulizer solution USE 1 VIAL IN  NEBULIZER EVERY 6 HOURS AS NEEDED FOR WHEEZING OR SHORTNESS OF BREATH. Patient taking differently: Take 2.5 mg by nebulization every 6 (six) hours as needed for wheezing or shortness of breath. 02/06/21  Yes Cook, Jayce G, DO  albuterol (VENTOLIN HFA) 108 (90 Base) MCG/ACT inhaler INHALE 2 PUFFS INTO THE LUNGS EVERY 6 HOURS AS NEEDED FOR SHORTNESS OF BREATH OR WHEEZING. Patient taking differently: Inhale 2 puffs into the lungs every 6 (six) hours as needed for wheezing or shortness of breath. 01/01/21  Yes Cook, Jayce G, DO  amLODipine (NORVASC) 10 MG tablet TAKE (1) TABLET BY MOUTH ONCE DAILY. Patient taking differently: Take 10 mg by mouth daily. 04/22/21  Yes Cook, Jayce G, DO  benazepril (LOTENSIN) 40 MG tablet TAKE (1) TABLET BY MOUTH ONCE DAILY. Patient taking differently: Take 40 mg by mouth daily. 07/08/21  Yes Cook, Jayce G, DO  Dulaglutide (TRULICITY) 3.24 MW/1.0UV SOPN Inject 0.75 mg into the skin once a week. 02/24/20  Yes [provider]  ELIQUIS 5 MG TABS tablet TAKE (1) TABLET BY MOUTH TWICE DAILY. Patient taking differently: Take 5 mg by mouth 2 (two) times daily. 06/11/21  Yes Cook, Jayce G, DO  fluticasone (FLOVENT HFA) 44 MCG/ACT inhaler Inhale 2 puffs into the lungs 2 (two) times daily. 06/08/18  Yes Mikey Kirschner, MD  HUMALOG MIX 75/25 KWIKPEN (75-25) 100 UNIT/ML Kwikpen Inject 12 Units into the skin in the morning and at bedtime. 09/24/15  Yes [provider]  Incontinence Supply Disposable (DEPEND UNDERWEAR X-LARGE) MISC Use daily as needed. 10/31/20  Yes Cook, Jayce G, DO  Insulin Pen Needle 31G X 8 MM MISC Use to inject insulin one time daily 02/21/13  Yes [provider]  levothyroxine (SYNTHROID) 100 MCG tablet Take 100 mcg by mouth daily before breakfast.   Yes [provider]  metoprolol succinate (TOPROL-XL) 100 MG 24 hr tablet Take 1 tablet (100 mg total) by mouth daily. Take with or immediately following a meal. 10/11/21  Yes Cook, [place]  G, DO  ONETOUCH ULTRA test strip USE TO TEST BLOOD SUGAR 4 TIMES DAILY AS DIRECTED. 06/18/20  Yes Luking, Elayne Snare, MD  polyethylene glycol (MIRALAX / GLYCOLAX) 17 g packet Take 17 g by mouth daily as needed for mild constipation. 09/04/21  Yes Shah, Pratik D, DO  pregabalin (LYRICA) 100 MG capsule Take 1 capsule (100 mg total) by mouth 3 (three) times daily. 09/09/21  Yes Danford, Suann Larry, MD  traMADol (ULTRAM) 50 MG tablet Take 1-1.5 tablets (50-75 mg total) by mouth every 8 (eight) hours as needed for moderate pain or severe pain. 11/06/21  Yes Cook, Jayce G, DO  pantoprazole (PROTONIX) 40 MG tablet Take 1 tablet (40 mg total) by mouth 2 (two) times daily before a meal. 09/04/21 10/04/21  Manuella Ghazi, Pratik D, DO  triamcinolone cream (KENALOG) 0.1 % Apply 1 Application topically 2 (two) times daily. To affected area 10/14/21   Coral Spikes, DO    Current Facility-Administered Medications  Medication Dose Route Frequency Provider Last Rate Last Admin   0.9 %  sodium chloride infusion   Intravenous Continuous Zierle-Ghosh, Asia B, DO 75  mL/hr at 11/10/21 1610 Infusion Verify at 11/10/21 9604   acetaminophen (TYLENOL) tablet 650 mg  650 mg Oral Q6H PRN Zierle-Ghosh, Asia B, DO   650 mg at 11/10/21 0457   Or   acetaminophen (TYLENOL) suppository 650 mg  650 mg Rectal Q6H PRN Zierle-Ghosh, Asia B, DO       albuterol (PROVENTIL) (2.5 MG/3ML) 0.083% nebulizer solution 2.5 mg  2.5 mg Nebulization Q6H PRN Zierle-Ghosh, Asia B, DO       amLODipine (NORVASC) tablet 10 mg  10 mg Oral Daily Zierle-Ghosh, Asia B, DO       apixaban (ELIQUIS) tablet 5 mg  5 mg Oral BID Zierle-Ghosh, Asia B, DO   5 mg at 11/10/21 0851   benazepril (LOTENSIN) tablet 40 mg  40 mg Oral Daily Zierle-Ghosh, Asia B, DO       insulin aspart (novoLOG) injection 0-15 Units  0-15 Units Subcutaneous Q6H Zierle-Ghosh, Asia B, DO   5 Units at 11/10/21 1114   insulin aspart (novoLOG) injection 0-5 Units  0-5 Units Subcutaneous QHS Zierle-Ghosh,  Asia B, DO       levothyroxine (SYNTHROID) tablet 100 mcg  100 mcg Oral Q0600 Zierle-Ghosh, Asia B, DO   100 mcg at 11/10/21 0459   metoprolol succinate (TOPROL-XL) 24 hr tablet 100 mg  100 mg Oral Daily Zierle-Ghosh, Asia B, DO   100 mg at 11/10/21 0853   ondansetron (ZOFRAN) tablet 4 mg  4 mg Oral Q6H PRN Zierle-Ghosh, Asia B, DO       Or   ondansetron (ZOFRAN) injection 4 mg  4 mg Intravenous Q6H PRN Zierle-Ghosh, Asia B, DO       oxyCODONE (Oxy IR/ROXICODONE) immediate release tablet 5 mg  5 mg Oral Q4H PRN Zierle-Ghosh, Asia B, DO   5 mg at 11/10/21 0119   pantoprazole (PROTONIX) EC tablet 40 mg  40 mg Oral BID AC Zierle-Ghosh, Asia B, DO   40 mg at 11/10/21 0852   piperacillin-tazobactam (ZOSYN) IVPB 3.375 g  3.375 g Intravenous Q8H Zierle-Ghosh, Asia B, DO 12.5 mL/hr at 11/10/21 1113 3.375 g at 11/10/21 1113   pregabalin (LYRICA) capsule 100 mg  100 mg Oral TID Zierle-Ghosh, Asia B, DO   100 mg at 11/10/21 0852    Allergies as of 11/09/2021 - Review Complete 11/09/2021  Allergen Reaction Noted   Procardia [nifedipine] Other (See Comments) 11/14/2010   Aspirin  04/04/2014   Diltiazem Itching 11/04/2013    Family History  Problem Relation Age of Onset   Diabetes Mother    Heart disease Father    Lung cancer Father    Diabetes Sister    Hypertension Sister    Asthma Sister    Rheum arthritis Daughter    Hypertension Daughter    Fibromyalgia Daughter    Hypertension Daughter     Social History   Socioeconomic History   Marital status: Divorced    Spouse name: Not on file   Number of children: 2   Years of education: 12   Highest education level: 12th grade  Occupational History   Occupation: Disability  Tobacco Use   Smoking status: Former    Packs/day: 0.25    Years: 20.00    Total pack years: 5.00    Types: Cigarettes    Quit date: 02/10/1991    Years since quitting: 30.7    Passive exposure: Past   Smokeless tobacco: Never  Vaping Use   Vaping Use: Never  used  Substance and Sexual Activity  Alcohol use: No   Drug use: No   Sexual activity: Not Currently    Partners: Male    Birth control/protection: Surgical  Other Topics Concern   Not on file  Social History Narrative   Lives in [place]         Social Determinants of Health   Financial Resource Strain: Low Risk  (10/28/2021)   Overall Financial Resource Strain (CARDIA)    Difficulty of Paying Living Expenses: Not hard at all  Food Insecurity: No Food Insecurity (10/28/2021)   Hunger Vital Sign    Worried About Running Out of Food in the Last Year: Never true    Ran Out of Food in the Last Year: Never true  Transportation Needs: No Transportation Needs (10/28/2021)   PRAPARE - Hydrologist (Medical): No    Lack of Transportation (Non-Medical): No  Physical Activity: Inactive (10/28/2021)   Exercise Vital Sign    Days of Exercise per Week: 0 days    Minutes of Exercise per Session: 0 min  Stress: No Stress Concern Present (10/28/2021)   [place]    Feeling of Stress : Not at all  Social Connections: Moderately Integrated (10/28/2021)   Social Connection and Isolation Panel [NHANES]    Frequency of Communication with Friends and Family: More than three times a week    Frequency of Social Gatherings with Friends and Family: More than three times a week    Attends Religious Services: More than 4 times per year    Active Member of Clubs or Organizations: Yes    Attends Archivist Meetings: 1 to 4 times per year    Marital Status: Divorced  Intimate Partner Violence: Not At Risk (10/28/2021)   Humiliation, Afraid, Rape, and Kick questionnaire    Fear of Current or Ex-Partner: No    Emotionally Abused: No    Physically Abused: No    Sexually Abused: No    Review of Systems: As in history of present illness  Physical Exam: Vital signs in last 24 hours: Temp:   [99.2 F (37.3 C)-100.4 F (38 C)] 100.1 F (37.8 C) (10/29 0450) Pulse Rate:  [71-115] 75 (10/29 0848) Resp:  [19-24] 20 (10/29 0450) BP: (117-157)/(48-89) 123/72 (10/29 0848) SpO2:  [94 %-99 %] 99 % (10/29 0848) Weight:  [135.2 kg] 135.2 kg (10/28 2011)   General:   Alert,   pleasant and cooperative in NAD Eyes:  Sclera clear, no icterus.   Conjunctiva pink. Abdomen:  Soft, nontender and nondistended. No masses, hepatosplenomegaly or hernias noted. Normal bowel sounds, without guarding, and without rebound.   Intake/Output from previous day: No intake/output data recorded. Intake/Output this shift: Total I/O In: 339.7 [I.V.:289.7; IV Piggyback:50] Out: -   Lab Results: Recent Labs    11/09/21 2035 11/10/21 0428  WBC 8.2 5.7  HGB 13.7 10.9*  HCT 41.0 33.0*  PLT 255 175   BMET Recent Labs    11/09/21 2039 11/10/21 0428  NA 138 139  K 3.8 3.2*  CL 98 107  CO2 28 25  GLUCOSE 219* 162*  BUN 13 11  CREATININE 0.98 0.86  CALCIUM 9.5 7.8*   LFT Recent Labs    11/10/21 0428  PROT 6.1*  ALBUMIN 3.0*  AST 769*  ALT 602*  ALKPHOS 162*  BILITOT 2.4*   PT/INR No results for input(s): "LABPROT", "INR" in the last 72 hours. Hepatitis Panel No results for input(s): "HEPBSAG", "  HCVAB", "HEPAIGM", "HEPBIGM" in the last 72 hours. C-Diff No results for input(s): "CDIFFTOX" in the last 72 hours.  Studies/Results: US Abdomen Limited RUQ (LIVER/GB)  Result Date: 11/10/2021 CLINICAL DATA:  Transaminitis. EXAM: ULTRASOUND ABDOMEN LIMITED RIGHT UPPER QUADRANT COMPARISON:  CT, 11/09/2021. FINDINGS: Gallbladder: Dependent stones. Mildly distended. No wall thickening or pericholecystic fluid. Common bile duct: Diameter: 4 mm Liver: Limited visualization. Increased parenchymal echogenicity. No defined mass. Portal vein is patent on color Doppler imaging with normal direction of blood flow towards the liver. Other: None. IMPRESSION: 1. Limited exam.  No acute findings. 2.  Increased liver parenchymal echogenicity consistent with hepatic steatosis. 3. Cholelithiasis. Electronically Signed   By: Lajean Manes M.D.   On: 11/10/2021 10:39   CT Abdomen Pelvis W Contrast  Result Date: 11/09/2021 CLINICAL DATA:  Acute abdominal pain. EXAM: CT ABDOMEN AND PELVIS WITH CONTRAST TECHNIQUE: Multidetector CT imaging of the abdomen and pelvis was performed using the standard protocol following bolus administration of intravenous contrast. RADIATION DOSE REDUCTION: This exam was performed according to the departmental dose-optimization program which includes automated exposure control, adjustment of the mA and/or kV according to patient size and/or use of iterative reconstruction technique. CONTRAST:  74m OMNIPAQUE IOHEXOL 300 MG/ML  SOLN COMPARISON:  CT abdomen and pelvis 08/31/2021 FINDINGS: Lower chest: No acute abnormality. Hepatobiliary: No focal liver abnormality is seen. No gallstones, gallbladder wall thickening, or biliary dilatation. Pancreas: Unremarkable. No pancreatic ductal dilatation or surrounding inflammatory changes. Spleen: Normal in size without focal abnormality. Adrenals/Urinary Tract: There is a 2.5 cm left renal cyst, unchanged. Otherwise, the kidneys, adrenal glands and bladder are within normal limits. Stomach/Bowel: Stomach is within normal limits. Appendix appears normal. No evidence of bowel wall thickening, distention, or inflammatory changes. Vascular/Lymphatic: Aortic atherosclerosis. No enlarged abdominal or pelvic lymph nodes. Reproductive: Calcified uterine fibroid seen measuring 1 cm. No adnexal mass. Other: No abdominal wall hernia or abnormality. No abdominopelvic ascites. Musculoskeletal: Degenerative changes affect the spine. IMPRESSION: 1. No acute localizing process in the abdomen or pelvis. 2. Left Bosniak I benign renal cyst measuring 2.5 cm. No follow-up imaging is recommended. JACR 2018 Feb; 264-273, Management of the Incidental Renal Mass on CT,  [employer] 2021; 814-848, Bosniak Classification of Cystic Renal Masses, Version 2019. 3. Calcified uterine fibroid. Aortic Atherosclerosis (ICD10-I70.0). Electronically Signed   By: ARonney AstersM.D.   On: 11/09/2021 22:41   DG Chest Portable 1 View  Result Date: 11/09/2021 CLINICAL DATA:  Shortness of breath EXAM: PORTABLE CHEST 1 VIEW COMPARISON:  Chest x-ray 04/08/2015 FINDINGS: There is some atelectatic changes in the lower lungs. There is no lung consolidation, pleural effusion or pneumothorax. Cardiomediastinal silhouette is within normal limits. Surgical clips overlie the lower neck, unchanged. No acute fractures. IMPRESSION: No active disease. Atelectatic changes in the lower lungs. Electronically Signed   By: ARonney AstersM.D.   On: 11/09/2021 22:10     Impression: 66year old morbidly obese lady with recurrent epigastric and right upper quadrant abdominal pain in the setting of hypertension, diabetes, dyslipidemia chronic kidney disease and atrial fibrillation admitted with markedly elevated aminotransferases.  This hospitalization marks of the second distinct episode of upper abdominal pain in association with significantly elevated aminotransferases which have subsequently rapidly improved.  LFTs completely normalized since her first episode 2 months ago.  It appears that numbers are again plummeting in less than 24 hours.   She has gallstones in the gallbladder.  No obvious evidence of common duct stone or biliary dilation on imaging.  In  spite of negative imaging for obstruction, this clinical scenario is highly compelling for recurrent choledocholithiasis.  There is really not anything else that would produce such as similar clinical picture.  She has rapidly improved over the past 24 hours.  Recommendations  This lady needs a cholecystectomy.  Would consider pursuing an MRCP only if her LFTs do not continue to plummet back down to normal.  EGD is not indicated at this  time.  Lets recheck a hepatic function profile tomorrow morning.  Surgery consultation recommended.     Notice:  This dictation was prepared with Dragon dictation along with smaller phrase technology. Any transcriptional errors that result from this process are unintentional and may not be corrected upon review.

## 2021-11-10 NOTE — Progress Notes (Signed)
On home CPAP 10, nasal mask , ramps from 4 to 10. Dream station removed from room.

## 2021-11-10 NOTE — H&P (Signed)
History and Physical    Patient: [name] EQA:[id number] DOB: [birth year] DOA: 11/09/2021 DOS: the patient was seen and examined on 11/10/2021 PCP: Coral Spikes, DO  Patient coming from: Home  Chief Complaint:  Chief Complaint  Patient presents with   Abdominal Pain   HPI: [name] is a 66 y.o. female with medical history significant of BMI 50.36, GERD, hyperlipidemia, hypertension, insulin-dependent diabetes mellitus, osteoarthritis, sleep apnea, and more presents the ED with a chief complaint of abdominal pain.  Abdominal pain started at 3:00 in the morning on 28 October.  Patient reports that it is a burning-like pain.  Is located in her epigastrium.  It started as intermittent pain but got progressively worse until it became constant.  She tried Mylanta 3 times without relief.  She took Protonix and did not have relief.  Patient reports she has not had an appetite all day because of the pain.  Her last normal meal was yesterday.  Her last bowel movement was same day as presentation with an enema.  It is normal for her to go 2-3 times per week.  Patient reports that she is completely immobile so she has to use enemas more often and when she was able to move around.  Patient reports that she is immobile secondary to bone-on-bone arthritis in her knees.  She has been to rehab and she has home PT/OT.  She can stand to transfer herself from the wheelchair, she does have a Hoyer lift at home already.  Patient reports she has had some nausea and vomiting.  There is no hematemesis.  Reports the vomit appears as water and is a very small amount.  The only thing that made her pain better was morphine given in the ED.  Patient was admitted in August for the same kind of abdominal pain.  At that time she also had transaminitis.  She was diagnosed with cholelithiasis, but no cholecystitis and no choledocholithiasis.  There is discussion of MRCP which would be technically difficult due to body  habitus.  Patient was improving clinically, so patient was discharged with plans to follow-up with GI.  Of note: Patient has threatened the nurses twice since she came into our ED.  The first instance is documented by an ER nurse that she said if she had again she would shoot him.  The second, she told me that she was going to knock the nurses out on the third floor if they were not nice to her.  Have advised her that threatening her staff will not be tolerated and she can be removed from the hospital for it. Review of Systems: As mentioned in the history of present illness. All other systems reviewed and are negative. Past Medical History:  Diagnosis Date   Asthma    prn neb. and inhaler   Breast cancer ([place]) 01/2013   right   Dehydration 04/13/2013   GERD (gastroesophageal reflux disease)    Graves' disease    Hyperlipidemia    Hypertension    on multiple meds., has been on med. > 14 yr.   Non-insulin dependent type 2 diabetes mellitus ([place])    Obesity    Ophthalmic manifestation of Graves disease    Osteoarthritis 2003   left knee   Radiation 10/11/13-11/29/13   Right Breast   Sleep apnea 4/16   mod   Wears glasses    Past Surgical History:  Procedure Laterality Date   CARDIOVERSION N/A 11/08/2013   Procedure: CARDIOVERSION;  Surgeon: Lelon Perla, MD;  Location: [facility] ENDOSCOPY;  Service: Cardiovascular;  Laterality: N/A;   COLONOSCOPY  2007   COLONOSCOPY W/ POLYPECTOMY  2009   EXCISION OF KELOID N/A 05/18/2014   Procedure: EXCISION OF KELOID;  Surgeon: Erroll Luna, MD;  Location: [place];  Service: General;  Laterality: N/A;   PORT-A-CATH REMOVAL Right 05/18/2014   Procedure: REMOVAL PORT-A-CATH;  Surgeon: Erroll Luna, MD;  Location: [place];  Service: General;  Laterality: Right;   PORTACATH PLACEMENT Right 02/17/2013   Procedure: INSERTION PORT-A-CATH;  Surgeon: Joyice Faster. Cornett, MD;  Location: [place];  Service:  General;  Laterality: Right;   TEE WITHOUT CARDIOVERSION N/A 11/08/2013   Procedure: TRANSESOPHAGEAL ECHOCARDIOGRAM (TEE);  Surgeon: Lelon Perla, MD;  Location[facility] ENDOSCOPY;  Service: Cardiovascular;  Laterality: N/A;   TOTAL KNEE ARTHROPLASTY Left 2003   TOTAL THYROIDECTOMY     TUBAL LIGATION  1985   Social History:  reports that she quit smoking about 30 years ago. Her smoking use included cigarettes. She has a 5.00 pack-year smoking history. She has been exposed to tobacco smoke. She has never used smokeless tobacco. She reports that she does not drink alcohol and does not use drugs.  Allergies  Allergen Reactions   Procardia [Nifedipine] Other (See Comments)    MIGRAINES   Aspirin     Unable to take due to blood pressure medication   Diltiazem Itching    Family History  Problem Relation Age of Onset   Diabetes Mother    Heart disease Father    Lung cancer Father    Diabetes Sister    Hypertension Sister    Asthma Sister    Rheum arthritis Daughter    Hypertension Daughter    Fibromyalgia Daughter    Hypertension Daughter     Prior to Admission medications   Medication Sig Start Date End Date Taking? Authorizing Provider  albuterol (PROVENTIL) (2.5 MG/3ML) 0.083% nebulizer solution USE 1 VIAL IN NEBULIZER EVERY 6 HOURS AS NEEDED FOR WHEEZING OR SHORTNESS OF BREATH. Patient taking differently: Take 2.5 mg by nebulization every 6 (six) hours as needed for wheezing or shortness of breath. 02/06/21  Yes Cook, Jayce G, DO  albuterol (VENTOLIN HFA) 108 (90 Base) MCG/ACT inhaler INHALE 2 PUFFS INTO THE LUNGS EVERY 6 HOURS AS NEEDED FOR SHORTNESS OF BREATH OR WHEEZING. Patient taking differently: Inhale 2 puffs into the lungs every 6 (six) hours as needed for wheezing or shortness of breath. 01/01/21  Yes Cook, Jayce G, DO  amLODipine (NORVASC) 10 MG tablet TAKE (1) TABLET BY MOUTH ONCE DAILY. Patient taking differently: Take 10 mg by mouth daily. 04/22/21  Yes Cook, Jayce G, DO   benazepril (LOTENSIN) 40 MG tablet TAKE (1) TABLET BY MOUTH ONCE DAILY. Patient taking differently: Take 40 mg by mouth daily. 07/08/21  Yes Cook, Jayce G, DO  Dulaglutide (TRULICITY) 8.10 FB/5.1WC SOPN Inject 0.75 mg into the skin once a week. 02/24/20  Yes [provider]  ELIQUIS 5 MG TABS tablet TAKE (1) TABLET BY MOUTH TWICE DAILY. Patient taking differently: Take 5 mg by mouth 2 (two) times daily. 06/11/21  Yes Cook, Jayce G, DO  fluticasone (FLOVENT HFA) 44 MCG/ACT inhaler Inhale 2 puffs into the lungs 2 (two) times daily. 06/08/18  Yes Mikey Kirschner, MD  HUMALOG MIX 75/25 KWIKPEN (75-25) 100 UNIT/ML Kwikpen Inject 12 Units into the skin in the morning and at bedtime. 09/24/15  Yes [provider]  Incontinence Supply Disposable (DEPEND UNDERWEAR X-LARGE) MISC Use daily as needed. 10/31/20  Yes Cook, Jayce G, DO  Insulin Pen Needle 31G X 8 MM MISC Use to inject insulin one time daily 02/21/13  Yes [provider]  levothyroxine (SYNTHROID) 100 MCG tablet Take 100 mcg by mouth daily before breakfast.   Yes [provider]  metoprolol succinate (TOPROL-XL) 100 MG 24 hr tablet Take 1 tablet (100 mg total) by mouth daily. Take with or immediately following a meal. 10/11/21  Yes Cook, [place] G, DO  ONETOUCH ULTRA test strip USE TO TEST BLOOD SUGAR 4 TIMES DAILY AS DIRECTED. 06/18/20  Yes Luking, Elayne Snare, MD  polyethylene glycol (MIRALAX / GLYCOLAX) 17 g packet Take 17 g by mouth daily as needed for mild constipation. 09/04/21  Yes Shah, Pratik D, DO  pregabalin (LYRICA) 100 MG capsule Take 1 capsule (100 mg total) by mouth 3 (three) times daily. 09/09/21  Yes Danford, Suann Larry, MD  traMADol (ULTRAM) 50 MG tablet Take 1-1.5 tablets (50-75 mg total) by mouth every 8 (eight) hours as needed for moderate pain or severe pain. 11/06/21  Yes Cook, Jayce G, DO  pantoprazole (PROTONIX) 40 MG tablet Take 1 tablet (40 mg total) by mouth 2 (two) times daily before a meal.  09/04/21 10/04/21  Manuella Ghazi, Pratik D, DO  triamcinolone cream (KENALOG) 0.1 % Apply 1 Application topically 2 (two) times daily. To affected area 10/14/21   Coral Spikes, DO    Physical Exam: Vitals:   11/09/21 2016 11/09/21 2053 11/09/21 2054 11/09/21 2310  BP: (!) 157/89   (!) 157/75  Pulse: (!) 115 (!) 115 (!) 106 97  Resp: 20 (!) 21 19 (!) 24  Temp: (!) 100.4 F (38 C)     TempSrc: Oral     SpO2: 94% 96% 95% 94%  Weight:      Height:       1.  General: Patient lying supine in bed,  no acute distress   2. Psychiatric: Somnolent but arousable to voice and oriented x 3, mood and behavior normal for situation, and cooperative with exam   3. Neurologic: Speech and language are normal, face is symmetric, moves all 4 extremities voluntarily, at baseline without acute deficits on limited exam   4. HEENMT:  Head is atraumatic, normocephalic, pupils reactive to light, neck is supple, trachea is midline, mucous membranes are moist   5. Respiratory : Lungs are clear to auscultation bilaterally without wheezing, rhonchi, rales, no cyanosis, no increase in work of breathing or accessory muscle use   6. Cardiovascular : Heart rate normal, rhythm is regular, no murmurs, rubs or gallops, trace edema, peripheral pulses palpated   7. Gastrointestinal:  Abdomen is soft, nondistended, nontender to palpation at the time of my exam status post morphine, bowel sounds active, no masses or organomegaly palpated   8. Skin:  Skin is warm, dry and intact without rashes, acute lesions, or ulcers on limited exam   9.Musculoskeletal:  No acute deformities or trauma, no asymmetry in tone, trace edema, peripheral pulses palpated, no tenderness to palpation in the extremities  Data Reviewed: In the ED Temp 100.4, heart rate 106-1 15, respiratory rate 19-21, blood pressure 157/89, satting 94-95% UA pending Leukocytosis with white blood cell count of 8.2, hemoglobin 13.7 Basic chemistry was remarkable for  glucose of 219 Liver function shows an AST 1494, ALT 911, T. bili 2.7 Patient was given Zosyn, half a liter bolus, and morphine in the ED GI  was consulted and recommended admitting patient [facility] to [facility] to trend transaminases in the a.m. and consider right upper quadrant ultrasound  Assessment and Plan: Hypothyroidism - Continue Synthroid  Obstructive sleep apnea - CPAP ordered  SIRS (systemic inflammatory response syndrome) (HCC) - Febrile at 100.4, tachycardic, tachypneic - T. bili greater than 2.7 - Appears there is concern for cholangitis given the transaminitis, but not seen on CT.  No inflammation identified on CT at all. - Zosyn started in the ED, continue Zosyn - UA still pending - Chest x-ray showed no acute disease - For now, suspicion of an intra-abdominal infection and no confirmed source  Knee pain - Bilateral knee pain with known osteoarthritis - Pain scale for pain control  Transaminitis - Acute transaminitis -3 days ago AST 21, ALT 15 - Today AST 1494, ALT 911 - T. bili up to 2.7 from 0.3 - Patient has had transaminitis before with AST up to 392 and ALT up to 269 at which time viral hepatitis was ruled out and patient had clinical improvement which led to discharge without MRCP - CT abdomen pelvis showed no acute liver abnormality - GI was consulted and recommends trending transaminases in the a.m., and considering right upper quadrant ultrasound - GI will see in the a.m. - Hold hepatotoxic agents when possible - Acute hepatitis panel - Continue to monitor  Type 2 diabetes mellitus with hyperlipidemia (HCC) - Hyperglycemic in the ED at 219 - Patient takes 12 units of 70/30 at baseline - While in the hospital we will continue moderate dose sliding scale - Patient currently on clear liquids as she has had abdominal pain and nausea, and there is also possibility that she will need a procedure done  Hypertension - Continue amlodipine, benazepril,  metoprolol - Blood pressure in the ED 157/89 - Continue to monitor      Advance Care Planning:   Code Status: Prior   Consults: GI  Family Communication: 2 daughters at bedside  Severity of Illness: The appropriate patient status for this patient is INPATIENT. Inpatient status is judged to be reasonable and necessary in order to provide the required intensity of service to ensure the patient's safety. The patient's presenting symptoms, physical exam findings, and initial radiographic and laboratory data in the context of their chronic comorbidities is felt to place them at high risk for further clinical deterioration. Furthermore, it is not anticipated that the patient will be medically stable for discharge from the hospital within 2 midnights of admission.   * I certify that at the point of admission it is my clinical judgment that the patient will require inpatient hospital care spanning beyond 2 midnights from the point of admission due to high intensity of service, high risk for further deterioration and high frequency of surveillance required.*  Author: Rolla Plate, DO 11/10/2021 12:18 AM  For on call review [email or web address].

## 2021-11-10 NOTE — Assessment & Plan Note (Signed)
-   Febrile at 100.4, tachycardic, tachypneic - T. bili greater than 2.7 - Appears there is concern for cholangitis given the transaminitis, but not seen on CT.  No inflammation identified on CT at all. - Zosyn started in the ED, continue Zosyn - UA still pending - Chest x-ray showed no acute disease - For now, suspicion of an intra-abdominal infection and no confirmed source

## 2021-11-10 NOTE — Progress Notes (Signed)
Pt and pt family voiced that they wanted to use an external female catheter, but only if it were changed properly by staff. This Probation officer informed them that I would ensure this was done on my shift and upcoming shifts as well. Pt has been pleasant and cooperative this shift, wearing c-pap machine from home as our machine did not fit her correctly per pt. No c/o pain or discomfort, all needs met. Pt resting comfortably in bed at this time. Will continue to monitor.

## 2021-11-11 DIAGNOSIS — R109 Unspecified abdominal pain: Secondary | ICD-10-CM

## 2021-11-11 DIAGNOSIS — K8020 Calculus of gallbladder without cholecystitis without obstruction: Secondary | ICD-10-CM

## 2021-11-11 DIAGNOSIS — R7401 Elevation of levels of liver transaminase levels: Secondary | ICD-10-CM

## 2021-11-11 DIAGNOSIS — E039 Hypothyroidism, unspecified: Secondary | ICD-10-CM | POA: Diagnosis not present

## 2021-11-11 DIAGNOSIS — R6510 Systemic inflammatory response syndrome (SIRS) of non-infectious origin without acute organ dysfunction: Secondary | ICD-10-CM

## 2021-11-11 DIAGNOSIS — I10 Essential (primary) hypertension: Secondary | ICD-10-CM | POA: Diagnosis not present

## 2021-11-11 LAB — CBC WITH DIFFERENTIAL/PLATELET
Abs Immature Granulocytes: 0.02 10*3/uL (ref 0.00–0.07)
Basophils Absolute: 0 10*3/uL (ref 0.0–0.1)
Basophils Relative: 0 %
Eosinophils Absolute: 0.1 10*3/uL (ref 0.0–0.5)
Eosinophils Relative: 1 %
HCT: 33 % — ABNORMAL LOW (ref 36.0–46.0)
Hemoglobin: 10.9 g/dL — ABNORMAL LOW (ref 12.0–15.0)
Immature Granulocytes: 0 %
Lymphocytes Relative: 18 %
Lymphs Abs: 1 10*3/uL (ref 0.7–4.0)
MCH: 28.1 pg (ref 26.0–34.0)
MCHC: 33 g/dL (ref 30.0–36.0)
MCV: 85.1 fL (ref 80.0–100.0)
Monocytes Absolute: 0.6 10*3/uL (ref 0.1–1.0)
Monocytes Relative: 11 %
Neutro Abs: 3.9 10*3/uL (ref 1.7–7.7)
Neutrophils Relative %: 70 %
Platelets: 175 10*3/uL (ref 150–400)
RBC: 3.88 MIL/uL (ref 3.87–5.11)
RDW: 16.8 % — ABNORMAL HIGH (ref 11.5–15.5)
WBC: 5.7 10*3/uL (ref 4.0–10.5)
nRBC: 0 % (ref 0.0–0.2)

## 2021-11-11 LAB — HEPATIC FUNCTION PANEL
ALT: 565 U/L — ABNORMAL HIGH (ref 0–44)
AST: 476 U/L — ABNORMAL HIGH (ref 15–41)
Albumin: 3.1 g/dL — ABNORMAL LOW (ref 3.5–5.0)
Alkaline Phosphatase: 166 U/L — ABNORMAL HIGH (ref 38–126)
Bilirubin, Direct: 1.1 mg/dL — ABNORMAL HIGH (ref 0.0–0.2)
Indirect Bilirubin: 1.1 mg/dL — ABNORMAL HIGH (ref 0.3–0.9)
Total Bilirubin: 2.2 mg/dL — ABNORMAL HIGH (ref 0.3–1.2)
Total Protein: 6.6 g/dL (ref 6.5–8.1)

## 2021-11-11 LAB — GLUCOSE, CAPILLARY
Glucose-Capillary: 130 mg/dL — ABNORMAL HIGH (ref 70–99)
Glucose-Capillary: 131 mg/dL — ABNORMAL HIGH (ref 70–99)
Glucose-Capillary: 155 mg/dL — ABNORMAL HIGH (ref 70–99)
Glucose-Capillary: 157 mg/dL — ABNORMAL HIGH (ref 70–99)

## 2021-11-11 MED ORDER — POTASSIUM CHLORIDE CRYS ER 20 MEQ PO TBCR
40.0000 meq | EXTENDED_RELEASE_TABLET | Freq: Once | ORAL | Status: AC
  Administered 2021-11-11: 40 meq via ORAL
  Filled 2021-11-11: qty 2

## 2021-11-11 NOTE — Progress Notes (Signed)
Patient using home CPAP unit independently. Unit working correctly and plugged into red outlet.

## 2021-11-11 NOTE — Progress Notes (Signed)
Labs reviewed. HFP continues to improve. Surgery consult placed. Holding off on MRI/MRCP at this time as labs improving. Recommend cholecystectomy. No EGD needed. GI signing off. Please reach out if any further questions.  Annitta Needs, PhD, ANP-BC [facility]

## 2021-11-11 NOTE — Consult Note (Addendum)
I was present with the medical student for this service. I personally verified the history of present illness, performed the physical exam, and made the plan for this encounter. I have verified the medical student's documentation and made modifications where appropriately. I have personally documented in my own words a brief history, physical, and plan below.     Pain and nausea improved. LFTs up and gallstones on [place]. Findings all pointing to likely passing a stone.  Plan for Lap chole and IOC on Wednesday. Eliquis will be off and LFTs will have time to come down. Zosyn for now  Diet as tolerated  Hospitalist adding ECHO to assess cardiac changes  No recent CP or SOB  Curlene Labrum, MD [facility] [address] [place], [place] [address] [phone] (office)  Reason for Consult: Cholelithiasis, abnormal LFTs Referring Physician: Dr. Hurshel Keys  [name] is an 66 y.o. female with a past medical history of hypertension, diabetes, right breast cancer with lumpectomy, bilateral knee pain, and atrial flutter.  HPI: On 10/28 around 3am, patient began to have intermittent sharp epigastric pain that radiated to her right shoulder. The pain progressed to being constant and worsened. It was not improved with Protonix or Mylanta. She had fever, nausea, and vomiting with the pain. Patient had similar pain on 8/19 with ED visit. Her last bowel movement was 10/28. She denied jaundice, chills, melena, pale stools, hematochezia, or constipation. She takes Tylenol '650mg'$  2 tablets every 4-6 hours and 1-2 tablets of Tramadol q8hrs for her chronic knee pain.  In the ED, patient was febrile to 100.4 and tachycardic with normal WBC. Lipase was normal. AST 1494, ALT 911, ALP 227, and Bili 2.7. Hepatitis panel was negative. CT showed no gallstones. RUQ [place] yesterday + cholelithiasis.  8/19 ED visit: CT Abd pelvis: Cholelithiasis, RUQ [place]: Gallstones. LFTs were elevated  392/269.  Today, patient reports she is doing well. Her abdominal pain has completely resolved. She denies nausea, vomiting, abdominal pain, fevers, and chills.   Past Medical History:  Diagnosis Date   Asthma    prn neb. and inhaler   Breast cancer ([place]) 01/13/2013   right   Dehydration 04/13/2013   GERD (gastroesophageal reflux disease)    Graves' disease    Hyperlipidemia    Hypertension    on multiple meds., has been on med. > 14 yr.   Non-insulin dependent type 2 diabetes mellitus ([place])    Obesity    Ophthalmic manifestation of Graves disease    Osteoarthritis 01/13/2001   left knee   Radiation 10/11/13-11/29/13   Right Breast   Sleep apnea 04/14/2014   mod  , wears cpap 10 , ramps from 4 , nasal mask   Wears glasses     Past Surgical History:  Procedure Laterality Date   CARDIOVERSION N/A 11/08/2013   Procedure: CARDIOVERSION;  Surgeon: Lelon Perla, MD;  Location: [facility] ENDOSCOPY;  Service: Cardiovascular;  Laterality: N/A;   COLONOSCOPY  2007   COLONOSCOPY W/ POLYPECTOMY  2009   EXCISION OF KELOID N/A 05/18/2014   Procedure: EXCISION OF KELOID;  Surgeon: Erroll Luna, MD;  Location: [place];  Service: General;  Laterality: N/A;   PORT-A-CATH REMOVAL Right 05/18/2014   Procedure: REMOVAL PORT-A-CATH;  Surgeon: Erroll Luna, MD;  Location: [place];  Service: General;  Laterality: Right;   PORTACATH PLACEMENT Right 02/17/2013   Procedure: INSERTION PORT-A-CATH;  Surgeon: Joyice Faster. Cornett, MD;  Location: [place];  Service: General;  Laterality: Right;   TEE WITHOUT CARDIOVERSION N/A 11/08/2013   Procedure: TRANSESOPHAGEAL ECHOCARDIOGRAM (TEE);  Surgeon: Lelon Perla, MD;  Location: [facility] ENDOSCOPY;  Service: Cardiovascular;  Laterality: N/A;   TOTAL KNEE ARTHROPLASTY Left 2003   TOTAL THYROIDECTOMY     TUBAL LIGATION  1985    Family History  Problem Relation Age of Onset   Diabetes Mother    Heart disease  Father    Lung cancer Father    Diabetes Sister    Hypertension Sister    Asthma Sister    Rheum arthritis Daughter    Hypertension Daughter    Fibromyalgia Daughter    Hypertension Daughter     Social History:  reports that she quit smoking about 30 years ago. Her smoking use included cigarettes. She has a 5.00 pack-year smoking history. She has been exposed to tobacco smoke. She has never used smokeless tobacco. She reports that she does not drink alcohol and does not use drugs.  Allergies:  Allergies  Allergen Reactions   Procardia [Nifedipine] Other (See Comments)    MIGRAINES   Aspirin     Unable to take due to blood pressure medication   Diltiazem Itching    Medications: I have reviewed the patient's current medications. Current Facility-Administered Medications  Medication Dose Route Frequency Provider Last Rate Last Admin   0.9 %  sodium chloride infusion   Intravenous Continuous Zierle-Ghosh, Asia B, DO 75 mL/hr at 11/11/21 0423 New Bag at 11/11/21 0423   acetaminophen (TYLENOL) tablet 650 mg  650 mg Oral Q6H PRN Zierle-Ghosh, Asia B, DO   650 mg at 11/10/21 1828   Or   acetaminophen (TYLENOL) suppository 650 mg  650 mg Rectal Q6H PRN Zierle-Ghosh, Asia B, DO       albuterol (PROVENTIL) (2.5 MG/3ML) 0.083% nebulizer solution 2.5 mg  2.5 mg Nebulization Q6H PRN Zierle-Ghosh, Asia B, DO       insulin aspart (novoLOG) injection 0-15 Units  0-15 Units Subcutaneous Q6H Zierle-Ghosh, Asia B, DO   3 Units at 11/11/21 1140   insulin aspart (novoLOG) injection 0-5 Units  0-5 Units Subcutaneous QHS Zierle-Ghosh, Asia B, DO       levothyroxine (SYNTHROID) tablet 100 mcg  100 mcg Oral Q0600 Zierle-Ghosh, Asia B, DO   100 mcg at 11/11/21 0518   metoprolol succinate (TOPROL-XL) 24 hr tablet 100 mg  100 mg Oral Daily Zierle-Ghosh, Asia B, DO   100 mg at 11/11/21 0811   ondansetron (ZOFRAN) tablet 4 mg  4 mg Oral Q6H PRN Zierle-Ghosh, Asia B, DO       Or   ondansetron (ZOFRAN) injection  4 mg  4 mg Intravenous Q6H PRN Zierle-Ghosh, Asia B, DO       oxyCODONE (Oxy IR/ROXICODONE) immediate release tablet 5 mg  5 mg Oral Q4H PRN Zierle-Ghosh, Asia B, DO   5 mg at 11/11/21 1145   pantoprazole (PROTONIX) EC tablet 40 mg  40 mg Oral BID AC Zierle-Ghosh, Asia B, DO   40 mg at 11/11/21 0811   piperacillin-tazobactam (ZOSYN) IVPB 3.375 g  3.375 g Intravenous Q8H Zierle-Ghosh, Asia B, DO 12.5 mL/hr at 11/11/21 1142 3.375 g at 11/11/21 1142   pregabalin (LYRICA) capsule 100 mg  100 mg Oral TID Zierle-Ghosh, Asia B, DO   100 mg at 11/11/21 6837    Results for orders placed or performed during the hospital encounter of 11/09/21 (from the past 48 hour(s))  CBC with Differential  Status: Abnormal   Collection Time: 11/09/21  8:35 PM  Result Value Ref Range   WBC 8.2 4.0 - 10.5 K/uL   RBC 4.95 3.87 - 5.11 MIL/uL   Hemoglobin 13.7 12.0 - 15.0 g/dL   HCT 41.0 36.0 - 46.0 %   MCV 82.8 80.0 - 100.0 fL   MCH 27.7 26.0 - 34.0 pg   MCHC 33.4 30.0 - 36.0 g/dL   RDW 16.9 (H) 11.5 - 15.5 %   Platelets 255 150 - 400 K/uL   nRBC 0.0 0.0 - 0.2 %   Neutrophils Relative % 84 %   Neutro Abs 6.8 1.7 - 7.7 K/uL   Lymphocytes Relative 10 %   Lymphs Abs 0.9 0.7 - 4.0 K/uL   Monocytes Relative 6 %   Monocytes Absolute 0.5 0.1 - 1.0 K/uL   Eosinophils Relative 0 %   Eosinophils Absolute 0.0 0.0 - 0.5 K/uL   Basophils Relative 0 %   Basophils Absolute 0.0 0.0 - 0.1 K/uL   Immature Granulocytes 0 %   Abs Immature Granulocytes 0.03 0.00 - 0.07 K/uL    Comment: Performed at [facility], [address] [place], [place] [address]  Hepatitis panel, acute     Status: None   Collection Time: 11/09/21  8:35 PM  Result Value Ref Range   Hepatitis B Surface Ag NON REACTIVE NON REACTIVE   HCV Ab NON REACTIVE NON REACTIVE    Comment: (NOTE) Nonreactive HCV antibody screen is consistent with no HCV infections,  unless recent infection is suspected or other evidence exists to indicate HCV infection.      Hep A IgM NON REACTIVE NON REACTIVE   Hep B C IgM NON REACTIVE NON REACTIVE    Comment: Performed at [facility], [place] [address] [place], [place] [address]  Lipase, blood     Status: None   Collection Time: 11/09/21  8:36 PM  Result Value Ref Range   Lipase 43 11 - 51 U/L    Comment: Performed at [facility], [address] [place], [place] [address]  Comprehensive metabolic panel     Status: Abnormal   Collection Time: 11/09/21  8:39 PM  Result Value Ref Range   Sodium 138 135 - 145 mmol/L   Potassium 3.8 3.5 - 5.1 mmol/L   Chloride 98 98 - 111 mmol/L   CO2 28 22 - 32 mmol/L   Glucose, Bld 219 (H) 70 - 99 mg/dL    Comment: Glucose reference range applies only to samples taken after fasting for at least 8 hours.   BUN 13 8 - 23 mg/dL   Creatinine, Ser 0.98 0.44 - 1.00 mg/dL   Calcium 9.5 8.9 - 10.3 mg/dL   Total Protein 8.7 (H) 6.5 - 8.1 g/dL   Albumin 4.1 3.5 - 5.0 g/dL   AST 1,494 (H) 15 - 41 U/L   ALT 911 (H) 0 - 44 U/L   Alkaline Phosphatase 227 (H) 38 - 126 U/L   Total Bilirubin 2.7 (H) 0.3 - 1.2 mg/dL   GFR, Estimated >60 >60 mL/min    Comment: (NOTE) Calculated using the CKD-EPI Creatinine Equation (2021)    Anion gap 12 5 - 15    Comment: Performed at [facility], [address], [place], [place] [address]  Glucose, capillary     Status: Abnormal   Collection Time: 11/10/21  1:34 AM  Result Value Ref Range   Glucose-Capillary 176 (H) 70 - 99 mg/dL    Comment:  Glucose reference range applies only to samples taken after fasting for at least 8 hours.  Comprehensive metabolic panel     Status: Abnormal   Collection Time: 11/10/21  4:28 AM  Result Value Ref Range   Sodium 139 135 - 145 mmol/L   Potassium 3.2 (L) 3.5 - 5.1 mmol/L   Chloride 107 98 - 111 mmol/L   CO2 25 22 - 32 mmol/L   Glucose, Bld 162 (H) 70 - 99 mg/dL    Comment: Glucose reference range applies only to samples taken after fasting for at least 8 hours.   BUN 11 8 - 23 mg/dL   Creatinine, Ser  0.86 0.44 - 1.00 mg/dL   Calcium 7.8 (L) 8.9 - 10.3 mg/dL   Total Protein 6.1 (L) 6.5 - 8.1 g/dL   Albumin 3.0 (L) 3.5 - 5.0 g/dL   AST 769 (H) 15 - 41 U/L   ALT 602 (H) 0 - 44 U/L   Alkaline Phosphatase 162 (H) 38 - 126 U/L   Total Bilirubin 2.4 (H) 0.3 - 1.2 mg/dL   GFR, Estimated >60 >60 mL/min    Comment: (NOTE) Calculated using the CKD-EPI Creatinine Equation (2021)    Anion gap 7 5 - 15    Comment: Performed at [facility], [address], [place], [place] [address]  Magnesium     Status: None   Collection Time: 11/10/21  4:28 AM  Result Value Ref Range   Magnesium 1.9 1.7 - 2.4 mg/dL    Comment: Performed at [facility], [address], [place], [place] [address]  CBC with Differential/Platelet     Status: Abnormal   Collection Time: 11/10/21  4:28 AM  Result Value Ref Range   WBC 5.7 4.0 - 10.5 K/uL   RBC 3.88 3.87 - 5.11 MIL/uL   Hemoglobin 10.9 (L) 12.0 - 15.0 g/dL   HCT 33.0 (L) 36.0 - 46.0 %   MCV 85.1 80.0 - 100.0 fL   MCH 28.1 26.0 - 34.0 pg   MCHC 33.0 30.0 - 36.0 g/dL   RDW 16.8 (H) 11.5 - 15.5 %   Platelets 175 150 - 400 K/uL   nRBC 0.0 0.0 - 0.2 %   Neutrophils Relative % 70 %   Neutro Abs 3.9 1.7 - 7.7 K/uL   Lymphocytes Relative 18 %   Lymphs Abs 1.0 0.7 - 4.0 K/uL   Monocytes Relative 11 %   Monocytes Absolute 0.6 0.1 - 1.0 K/uL   Eosinophils Relative 1 %   Eosinophils Absolute 0.1 0.0 - 0.5 K/uL   Basophils Relative 0 %   Basophils Absolute 0.0 0.0 - 0.1 K/uL   Immature Granulocytes 0 %   Abs Immature Granulocytes 0.02 0.00 - 0.07 K/uL    Comment: Performed at [facility], [address], [place], [place] [address]  Glucose, capillary     Status: Abnormal   Collection Time: 11/10/21  6:03 AM  Result Value Ref Range   Glucose-Capillary 193 (H) 70 - 99 mg/dL    Comment: Glucose reference range applies only to samples taken after fasting for at least 8 hours.  Glucose, capillary     Status: Abnormal   Collection Time: 11/10/21 11:12 AM  Result  Value Ref Range   Glucose-Capillary 204 (H) 70 - 99 mg/dL    Comment: Glucose reference range applies only to samples taken after fasting for at least 8 hours.  Glucose, capillary     Status: Abnormal   Collection Time: 11/10/21  4:11  PM  Result Value Ref Range   Glucose-Capillary 148 (H) 70 - 99 mg/dL    Comment: Glucose reference range applies only to samples taken after fasting for at least 8 hours.  Glucose, capillary     Status: Abnormal   Collection Time: 11/10/21  8:23 PM  Result Value Ref Range   Glucose-Capillary 153 (H) 70 - 99 mg/dL    Comment: Glucose reference range applies only to samples taken after fasting for at least 8 hours.   Comment 1 Notify RN    Comment 2 Document in Chart   Glucose, capillary     Status: Abnormal   Collection Time: 11/11/21 12:42 AM  Result Value Ref Range   Glucose-Capillary 131 (H) 70 - 99 mg/dL    Comment: Glucose reference range applies only to samples taken after fasting for at least 8 hours.   Comment 1 Notify RN    Comment 2 Document in Chart   Hepatic function panel     Status: Abnormal   Collection Time: 11/11/21  4:25 AM  Result Value Ref Range   Total Protein 6.6 6.5 - 8.1 g/dL   Albumin 3.1 (L) 3.5 - 5.0 g/dL   AST 476 (H) 15 - 41 U/L   ALT 565 (H) 0 - 44 U/L   Alkaline Phosphatase 166 (H) 38 - 126 U/L   Total Bilirubin 2.2 (H) 0.3 - 1.2 mg/dL   Bilirubin, Direct 1.1 (H) 0.0 - 0.2 mg/dL   Indirect Bilirubin 1.1 (H) 0.3 - 0.9 mg/dL    Comment: Performed at [facility], [address], [place], [place] [address]  Glucose, capillary     Status: Abnormal   Collection Time: 11/11/21  7:01 AM  Result Value Ref Range   Glucose-Capillary 130 (H) 70 - 99 mg/dL    Comment: Glucose reference range applies only to samples taken after fasting for at least 8 hours.  Glucose, capillary     Status: Abnormal   Collection Time: 11/11/21 11:06 AM  Result Value Ref Range   Glucose-Capillary 157 (H) 70 - 99 mg/dL    Comment: Glucose reference  range applies only to samples taken after fasting for at least 8 hours.    US Abdomen Limited RUQ (LIVER/GB)  Result Date: 11/10/2021 CLINICAL DATA:  Transaminitis. EXAM: ULTRASOUND ABDOMEN LIMITED RIGHT UPPER QUADRANT COMPARISON:  CT, 11/09/2021. FINDINGS: Gallbladder: Dependent stones. Mildly distended. No wall thickening or pericholecystic fluid. Common bile duct: Diameter: 4 mm Liver: Limited visualization. Increased parenchymal echogenicity. No defined mass. Portal vein is patent on color Doppler imaging with normal direction of blood flow towards the liver. Other: None. IMPRESSION: 1. Limited exam.  No acute findings. 2. Increased liver parenchymal echogenicity consistent with hepatic steatosis. 3. Cholelithiasis. Electronically Signed   By: Lajean Manes M.D.   On: 11/10/2021 10:39   CT Abdomen Pelvis W Contrast  Result Date: 11/09/2021 CLINICAL DATA:  Acute abdominal pain. EXAM: CT ABDOMEN AND PELVIS WITH CONTRAST TECHNIQUE: Multidetector CT imaging of the abdomen and pelvis was performed using the standard protocol following bolus administration of intravenous contrast. RADIATION DOSE REDUCTION: This exam was performed according to the departmental dose-optimization program which includes automated exposure control, adjustment of the mA and/or kV according to patient size and/or use of iterative reconstruction technique. CONTRAST:  65m OMNIPAQUE IOHEXOL 300 MG/ML  SOLN COMPARISON:  CT abdomen and pelvis 08/31/2021 FINDINGS: Lower chest: No acute abnormality. Hepatobiliary: No focal liver abnormality is seen. No gallstones, gallbladder wall thickening, or biliary dilatation. Pancreas:  Unremarkable. No pancreatic ductal dilatation or surrounding inflammatory changes. Spleen: Normal in size without focal abnormality. Adrenals/Urinary Tract: There is a 2.5 cm left renal cyst, unchanged. Otherwise, the kidneys, adrenal glands and bladder are within normal limits. Stomach/Bowel: Stomach is within  normal limits. Appendix appears normal. No evidence of bowel wall thickening, distention, or inflammatory changes. Vascular/Lymphatic: Aortic atherosclerosis. No enlarged abdominal or pelvic lymph nodes. Reproductive: Calcified uterine fibroid seen measuring 1 cm. No adnexal mass. Other: No abdominal wall hernia or abnormality. No abdominopelvic ascites. Musculoskeletal: Degenerative changes affect the spine. IMPRESSION: 1. No acute localizing process in the abdomen or pelvis. 2. Left Bosniak I benign renal cyst measuring 2.5 cm. No follow-up imaging is recommended. JACR 2018 Feb; 264-273, Management of the Incidental Renal Mass on CT, [employer] 2021; 814-848, Bosniak Classification of Cystic Renal Masses, Version 2019. 3. Calcified uterine fibroid. Aortic Atherosclerosis (ICD10-I70.0). Electronically Signed   By: Ronney Asters M.D.   On: 11/09/2021 22:41   DG Chest Portable 1 View  Result Date: 11/09/2021 CLINICAL DATA:  Shortness of breath EXAM: PORTABLE CHEST 1 VIEW COMPARISON:  Chest x-ray 04/08/2015 FINDINGS: There is some atelectatic changes in the lower lungs. There is no lung consolidation, pleural effusion or pneumothorax. Cardiomediastinal silhouette is within normal limits. Surgical clips overlie the lower neck, unchanged. No acute fractures. IMPRESSION: No active disease. Atelectatic changes in the lower lungs. Electronically Signed   By: Ronney Asters M.D.   On: 11/09/2021 22:10    ROS:  A comprehensive review of systems was negative except for: Musculoskeletal: positive for bilateral knee pain  Blood pressure 137/65, pulse 71, temperature 98.5 F (36.9 C), resp. rate 19, height 5' 4.5" (1.638 m), weight 135.2 kg, SpO2 98 %. Physical Exam:  Gen: Well appearing in no distress Pulm: Normal work of breathing. No wheezing, rhales, or crackles. CV: Normal rate. Normal S1 and S2 no murmurs. Abd: Normal bowel sounds. No scars. No tenderness to palpation, guarding, rigidity, or rebound.  Negative Murphy's Sign.  Assessment/Plan: Cholelithiasis with concern for choledocholithiasis -RUQ [place] notable for cholelithiasis with CBD 73m -Plan for cholecystectomy with intraoperative cholangiogram on 11/1 -Continue Zosyn  Transaminitis  -LFTs and bilirubin continue to downtrend: AST/ALT 476/565, ALP 166, Bili 2.2 -Continue to monitor labs  Atrial flutter -Continue to hold Eliquis for 2 days for surgery on 11/1 (held since 10/29 9am) -Last Echo 2017 LVEF 60-65% -Will repeat echo today  FEN/GI -Advance diet to heart healthy as tolerated -Keep patient NPO 10/31 midnight for 11/1 surgery  PPX -SCDs  ALeonette Nutting MS3 11/11/2021, 12:42 PM

## 2021-11-11 NOTE — Progress Notes (Signed)
PROGRESS NOTE    [name]  ZYY:[id number] DOB: 1955-11-11 DOA: 11/09/2021 PCP: Coral Spikes, DO    Brief Narrative:  66 y/o female with history of DM2, HTN, OSA, Obesity class 3, admitted to the hospital with abdominal pain and elevated LFTs. Noted to have cholelithiasis and similar presentation earlier this year. LFTs trending down with IV fluids. Concern that patient has been passing stones. Currently no evidence of choledocholithiasis on imaging (CT, [place]). GI following. Gen surgery consulted.    Assessment & Plan:   Active Problems:   Hypothyroidism   Obstructive sleep apnea   Hypertension   Type 2 diabetes mellitus with hyperlipidemia (HCC)   Transaminitis   Knee pain   SIRS (systemic inflammatory response syndrome) (HCC)   Transaminitis -Concern for recurrent choledocholithiasis -CT imaging and ultrasound imaging did not reveal any stones and bile duct or bile duct dilatation -She is noted to have cholelithiasis -Overall LFTs are trending down since admission -AST 1494->476 -ALT 911->565 -ALP 227->166 -Bili 2.7->2.2 -GI following, appreciate input -Continue IV fluids and recheck labs in a.m. -Gen surgery consulted for possible cholecystectomy     SIRS -Noted to be febrile, tachycardic and tachypneic -Started on broad-spectrum antibiotics for intra abdominal process -Overall hemodynamics are improving   Type 2 diabetes -Continued on 70/30 insulin -Continue sliding scale -Blood sugar stable   Atrial flutter -Continue on metoprolol -She is on chronic anticoagulation with Eliquis.  Since she may need surgery, will hold off on further Eliquis for now.  Last dose of Eliquis was a.m. of 10/29   Hypertension -Blood pressures currently stable -Holding amlodipine and benazepril -Continue metoprolol for now   Bilateral osteoarthritis of knees -Severely debilitating, mostly wheelchair-bound now -Wishes to pursue operative management, but will need to lose  weight prior to being considered for knee replacements   Obstructive sleep apnea -Continue CPAP   Hypothyroidism -Continue Synthroid   Obesity, class III -BMI 50.3 -Discussed the importance of healthy eating habits and physical activity   DVT prophylaxis: SCDs Start: 11/10/21 0024  Code Status: full code Family Communication: discussed with patient Disposition Plan: Status is: Inpatient Remains inpatient appropriate because: may need surgical management of cholelithiasis     Consultants:  GI Gen surgery  Procedures:    Antimicrobials:  Zosyn 10/29>    Subjective: No abdominal pain or vomiting  Objective: Vitals:   11/10/21 1415 11/10/21 1538 11/10/21 2026 11/11/21 0434  BP: (!) 98/54 135/62 (!) 133/50 (!) 132/48  Pulse: 83 71 68 68  Resp:   20 20  Temp: 99 F (37.2 C) 98.4 F (36.9 C) 99.2 F (37.3 C) 98.5 F (36.9 C)  TempSrc: Oral Oral Oral Oral  SpO2: 94%  97% 98%  Weight:      Height:        Intake/Output Summary (Last 24 hours) at 11/11/2021 1158 Last data filed at 11/11/2021 0800 Gross per 24 hour  Intake 1169.96 ml  Output 1450 ml  Net -280.04 ml   Filed Weights   11/09/21 2011  Weight: 135.2 kg    Examination:  General exam: Appears calm and comfortable  Respiratory system: Clear to auscultation. Respiratory effort normal. Cardiovascular system: S1 & S2 heard, RRR. No JVD, murmurs, rubs, gallops or clicks. No pedal edema. Gastrointestinal system: Abdomen is nondistended, soft and nontender. No organomegaly or masses felt. Normal bowel sounds heard. Central nervous system: Alert and oriented. No focal neurological deficits. Extremities: Symmetric 5 x 5 power. Skin: No rashes, lesions or ulcers Psychiatry:  Judgement and insight appear normal. Mood & affect appropriate.     Data Reviewed: I have personally reviewed following labs and imaging studies  CBC: Recent Labs  Lab 11/06/21 1227 11/09/21 2035 11/10/21 0428  WBC 5.5 8.2  5.7  NEUTROABS  --  6.8 3.9  HGB 12.3 13.7 10.9*  HCT 38.7 41.0 33.0*  MCV 87 82.8 85.1  PLT 215 255 564   Basic Metabolic Panel: Recent Labs  Lab 11/06/21 1227 11/09/21 2039 11/10/21 0428  NA 142 138 139  K 4.4 3.8 3.2*  CL 101 98 107  CO2 '25 28 25  '$ GLUCOSE 144* 219* 162*  BUN '10 13 11  '$ CREATININE 0.89 0.98 0.86  CALCIUM 9.2 9.5 7.8*  MG  --   --  1.9   GFR: Estimated Creatinine Clearance: 87.8 mL/min (by C-G formula based on SCr of 0.86 mg/dL). Liver Function Tests: Recent Labs  Lab 11/06/21 1227 11/09/21 2039 11/10/21 0428 11/11/21 0425  AST 21 1,494* 769* 476*  ALT 15 911* 602* 565*  ALKPHOS 93 227* 162* 166*  BILITOT 0.3 2.7* 2.4* 2.2*  PROT 7.2 8.7* 6.1* 6.6  ALBUMIN 4.2 4.1 3.0* 3.1*   Recent Labs  Lab 11/09/21 2036  LIPASE 43   No results for input(s): "AMMONIA" in the last 168 hours. Coagulation Profile: No results for input(s): "INR", "PROTIME" in the last 168 hours. Cardiac Enzymes: No results for input(s): "CKTOTAL", "CKMB", "CKMBINDEX", "TROPONINI" in the last 168 hours. BNP (last 3 results) No results for input(s): "PROBNP" in the last 8760 hours. HbA1C: No results for input(s): "HGBA1C" in the last 72 hours. CBG: Recent Labs  Lab 11/10/21 1611 11/10/21 2023 11/11/21 0042 11/11/21 0701 11/11/21 1106  GLUCAP 148* 153* 131* 130* 157*   Lipid Profile: No results for input(s): "CHOL", "HDL", "LDLCALC", "TRIG", "CHOLHDL", "LDLDIRECT" in the last 72 hours. Thyroid Function Tests: No results for input(s): "TSH", "T4TOTAL", "FREET4", "T3FREE", "THYROIDAB" in the last 72 hours. Anemia Panel: No results for input(s): "VITAMINB12", "FOLATE", "FERRITIN", "TIBC", "IRON", "RETICCTPCT" in the last 72 hours. Sepsis Labs: No results for input(s): "PROCALCITON", "LATICACIDVEN" in the last 168 hours.  No results found for this or any previous visit (from the past 240 hour(s)).       Radiology Studies: US Abdomen Limited RUQ  (LIVER/GB)  Result Date: 11/10/2021 CLINICAL DATA:  Transaminitis. EXAM: ULTRASOUND ABDOMEN LIMITED RIGHT UPPER QUADRANT COMPARISON:  CT, 11/09/2021. FINDINGS: Gallbladder: Dependent stones. Mildly distended. No wall thickening or pericholecystic fluid. Common bile duct: Diameter: 4 mm Liver: Limited visualization. Increased parenchymal echogenicity. No defined mass. Portal vein is patent on color Doppler imaging with normal direction of blood flow towards the liver. Other: None. IMPRESSION: 1. Limited exam.  No acute findings. 2. Increased liver parenchymal echogenicity consistent with hepatic steatosis. 3. Cholelithiasis. Electronically Signed   By: Lajean Manes M.D.   On: 11/10/2021 10:39   CT Abdomen Pelvis W Contrast  Result Date: 11/09/2021 CLINICAL DATA:  Acute abdominal pain. EXAM: CT ABDOMEN AND PELVIS WITH CONTRAST TECHNIQUE: Multidetector CT imaging of the abdomen and pelvis was performed using the standard protocol following bolus administration of intravenous contrast. RADIATION DOSE REDUCTION: This exam was performed according to the departmental dose-optimization program which includes automated exposure control, adjustment of the mA and/or kV according to patient size and/or use of iterative reconstruction technique. CONTRAST:  64m OMNIPAQUE IOHEXOL 300 MG/ML  SOLN COMPARISON:  CT abdomen and pelvis 08/31/2021 FINDINGS: Lower chest: No acute abnormality. Hepatobiliary: No focal liver abnormality is seen.  No gallstones, gallbladder wall thickening, or biliary dilatation. Pancreas: Unremarkable. No pancreatic ductal dilatation or surrounding inflammatory changes. Spleen: Normal in size without focal abnormality. Adrenals/Urinary Tract: There is a 2.5 cm left renal cyst, unchanged. Otherwise, the kidneys, adrenal glands and bladder are within normal limits. Stomach/Bowel: Stomach is within normal limits. Appendix appears normal. No evidence of bowel wall thickening, distention, or inflammatory  changes. Vascular/Lymphatic: Aortic atherosclerosis. No enlarged abdominal or pelvic lymph nodes. Reproductive: Calcified uterine fibroid seen measuring 1 cm. No adnexal mass. Other: No abdominal wall hernia or abnormality. No abdominopelvic ascites. Musculoskeletal: Degenerative changes affect the spine. IMPRESSION: 1. No acute localizing process in the abdomen or pelvis. 2. Left Bosniak I benign renal cyst measuring 2.5 cm. No follow-up imaging is recommended. JACR 2018 Feb; 264-273, Management of the Incidental Renal Mass on CT, [employer] 2021; [phone], Bosniak Classification of Cystic Renal Masses, Version 2019. 3. Calcified uterine fibroid. Aortic Atherosclerosis (ICD10-I70.0). Electronically Signed   By: Ronney Asters M.D.   On: 11/09/2021 22:41   DG Chest Portable 1 View  Result Date: 11/09/2021 CLINICAL DATA:  Shortness of breath EXAM: PORTABLE CHEST 1 VIEW COMPARISON:  Chest x-ray 04/08/2015 FINDINGS: There is some atelectatic changes in the lower lungs. There is no lung consolidation, pleural effusion or pneumothorax. Cardiomediastinal silhouette is within normal limits. Surgical clips overlie the lower neck, unchanged. No acute fractures. IMPRESSION: No active disease. Atelectatic changes in the lower lungs. Electronically Signed   By: Ronney Asters M.D.   On: 11/09/2021 22:10        Scheduled Meds:  insulin aspart  0-15 Units Subcutaneous Q6H   insulin aspart  0-5 Units Subcutaneous QHS   levothyroxine  100 mcg Oral Q0600   metoprolol succinate  100 mg Oral Daily   pantoprazole  40 mg Oral BID AC   pregabalin  100 mg Oral TID   Continuous Infusions:  sodium chloride 75 mL/hr at 11/11/21 0423   piperacillin-tazobactam 3.375 g (11/11/21 1142)     LOS: 2 days    Time spent: 31mns    JKathie Dike MD [facility]   If 7PM-7AM, please contact night-coverage [email or web address]  11/11/2021, 11:58 AM

## 2021-11-11 NOTE — H&P (View-Only) (Signed)
I was present with the medical student for this service. I personally verified the history of present illness, performed the physical exam, and made the plan for this encounter. I have verified the medical student's documentation and made modifications where appropriately. I have personally documented in my own words a brief history, physical, and plan below.     Pain and nausea improved. LFTs up and gallstones on [place]. Findings all pointing to likely passing a stone.  Plan for Lap chole and IOC on Wednesday. Eliquis will be off and LFTs will have time to come down. Zosyn for now  Diet as tolerated  Hospitalist adding ECHO to assess cardiac changes  No recent CP or SOB  Curlene Labrum, MD [facility] [address] [place], [place] [address] [phone] (office)  Reason for Consult: Cholelithiasis, abnormal LFTs Referring Physician: Dr. Hurshel Keys  [name] is an 66 y.o. female with a past medical history of hypertension, diabetes, right breast cancer with lumpectomy, bilateral knee pain, and atrial flutter.  HPI: On 10/28 around 3am, patient began to have intermittent sharp epigastric pain that radiated to her right shoulder. The pain progressed to being constant and worsened. It was not improved with Protonix or Mylanta. She had fever, nausea, and vomiting with the pain. Patient had similar pain on 8/19 with ED visit. Her last bowel movement was 10/28. She denied jaundice, chills, melena, pale stools, hematochezia, or constipation. She takes Tylenol '650mg'$  2 tablets every 4-6 hours and 1-2 tablets of Tramadol q8hrs for her chronic knee pain.  In the ED, patient was febrile to 100.4 and tachycardic with normal WBC. Lipase was normal. AST 1494, ALT 911, ALP 227, and Bili 2.7. Hepatitis panel was negative. CT showed no gallstones. RUQ [place] yesterday + cholelithiasis.  8/19 ED visit: CT Abd pelvis: Cholelithiasis, RUQ [place]: Gallstones. LFTs were elevated  392/269.  Today, patient reports she is doing well. Her abdominal pain has completely resolved. She denies nausea, vomiting, abdominal pain, fevers, and chills.   Past Medical History:  Diagnosis Date   Asthma    prn neb. and inhaler   Breast cancer ([place]) 01/13/2013   right   Dehydration 04/13/2013   GERD (gastroesophageal reflux disease)    Graves' disease    Hyperlipidemia    Hypertension    on multiple meds., has been on med. > 14 yr.   Non-insulin dependent type 2 diabetes mellitus ([place])    Obesity    Ophthalmic manifestation of Graves disease    Osteoarthritis 01/13/2001   left knee   Radiation 10/11/13-11/29/13   Right Breast   Sleep apnea 04/14/2014   mod  , wears cpap 10 , ramps from 4 , nasal mask   Wears glasses     Past Surgical History:  Procedure Laterality Date   CARDIOVERSION N/A 11/08/2013   Procedure: CARDIOVERSION;  Surgeon: Lelon Perla, MD;  Location: [facility] ENDOSCOPY;  Service: Cardiovascular;  Laterality: N/A;   COLONOSCOPY  2007   COLONOSCOPY W/ POLYPECTOMY  2009   EXCISION OF KELOID N/A 05/18/2014   Procedure: EXCISION OF KELOID;  Surgeon: Erroll Luna, MD;  Location: [place];  Service: General;  Laterality: N/A;   PORT-A-CATH REMOVAL Right 05/18/2014   Procedure: REMOVAL PORT-A-CATH;  Surgeon: Erroll Luna, MD;  Location: [place];  Service: General;  Laterality: Right;   PORTACATH PLACEMENT Right 02/17/2013   Procedure: INSERTION PORT-A-CATH;  Surgeon: Joyice Faster. Cornett, MD;  Location: [place];  Service: General;  Laterality: Right;   TEE WITHOUT CARDIOVERSION N/A 11/08/2013   Procedure: TRANSESOPHAGEAL ECHOCARDIOGRAM (TEE);  Surgeon: Lelon Perla, MD;  Location: [facility] ENDOSCOPY;  Service: Cardiovascular;  Laterality: N/A;   TOTAL KNEE ARTHROPLASTY Left 2003   TOTAL THYROIDECTOMY     TUBAL LIGATION  1985    Family History  Problem Relation Age of Onset   Diabetes Mother    Heart disease  Father    Lung cancer Father    Diabetes Sister    Hypertension Sister    Asthma Sister    Rheum arthritis Daughter    Hypertension Daughter    Fibromyalgia Daughter    Hypertension Daughter     Social History:  reports that she quit smoking about 30 years ago. Her smoking use included cigarettes. She has a 5.00 pack-year smoking history. She has been exposed to tobacco smoke. She has never used smokeless tobacco. She reports that she does not drink alcohol and does not use drugs.  Allergies:  Allergies  Allergen Reactions   Procardia [Nifedipine] Other (See Comments)    MIGRAINES   Aspirin     Unable to take due to blood pressure medication   Diltiazem Itching    Medications: I have reviewed the patient's current medications. Current Facility-Administered Medications  Medication Dose Route Frequency Provider Last Rate Last Admin   0.9 %  sodium chloride infusion   Intravenous Continuous Zierle-Ghosh, Asia B, DO 75 mL/hr at 11/11/21 0423 New Bag at 11/11/21 0423   acetaminophen (TYLENOL) tablet 650 mg  650 mg Oral Q6H PRN Zierle-Ghosh, Asia B, DO   650 mg at 11/10/21 1828   Or   acetaminophen (TYLENOL) suppository 650 mg  650 mg Rectal Q6H PRN Zierle-Ghosh, Asia B, DO       albuterol (PROVENTIL) (2.5 MG/3ML) 0.083% nebulizer solution 2.5 mg  2.5 mg Nebulization Q6H PRN Zierle-Ghosh, Asia B, DO       insulin aspart (novoLOG) injection 0-15 Units  0-15 Units Subcutaneous Q6H Zierle-Ghosh, Asia B, DO   3 Units at 11/11/21 1140   insulin aspart (novoLOG) injection 0-5 Units  0-5 Units Subcutaneous QHS Zierle-Ghosh, Asia B, DO       levothyroxine (SYNTHROID) tablet 100 mcg  100 mcg Oral Q0600 Zierle-Ghosh, Asia B, DO   100 mcg at 11/11/21 0518   metoprolol succinate (TOPROL-XL) 24 hr tablet 100 mg  100 mg Oral Daily Zierle-Ghosh, Asia B, DO   100 mg at 11/11/21 0811   ondansetron (ZOFRAN) tablet 4 mg  4 mg Oral Q6H PRN Zierle-Ghosh, Asia B, DO       Or   ondansetron (ZOFRAN) injection  4 mg  4 mg Intravenous Q6H PRN Zierle-Ghosh, Asia B, DO       oxyCODONE (Oxy IR/ROXICODONE) immediate release tablet 5 mg  5 mg Oral Q4H PRN Zierle-Ghosh, Asia B, DO   5 mg at 11/11/21 1145   pantoprazole (PROTONIX) EC tablet 40 mg  40 mg Oral BID AC Zierle-Ghosh, Asia B, DO   40 mg at 11/11/21 0811   piperacillin-tazobactam (ZOSYN) IVPB 3.375 g  3.375 g Intravenous Q8H Zierle-Ghosh, Asia B, DO 12.5 mL/hr at 11/11/21 1142 3.375 g at 11/11/21 1142   pregabalin (LYRICA) capsule 100 mg  100 mg Oral TID Zierle-Ghosh, Asia B, DO   100 mg at 11/11/21 3151    Results for orders placed or performed during the hospital encounter of 11/09/21 (from the past 48 hour(s))  CBC with Differential  Status: Abnormal   Collection Time: 11/09/21  8:35 PM  Result Value Ref Range   WBC 8.2 4.0 - 10.5 K/uL   RBC 4.95 3.87 - 5.11 MIL/uL   Hemoglobin 13.7 12.0 - 15.0 g/dL   HCT 41.0 36.0 - 46.0 %   MCV 82.8 80.0 - 100.0 fL   MCH 27.7 26.0 - 34.0 pg   MCHC 33.4 30.0 - 36.0 g/dL   RDW 16.9 (H) 11.5 - 15.5 %   Platelets 255 150 - 400 K/uL   nRBC 0.0 0.0 - 0.2 %   Neutrophils Relative % 84 %   Neutro Abs 6.8 1.7 - 7.7 K/uL   Lymphocytes Relative 10 %   Lymphs Abs 0.9 0.7 - 4.0 K/uL   Monocytes Relative 6 %   Monocytes Absolute 0.5 0.1 - 1.0 K/uL   Eosinophils Relative 0 %   Eosinophils Absolute 0.0 0.0 - 0.5 K/uL   Basophils Relative 0 %   Basophils Absolute 0.0 0.0 - 0.1 K/uL   Immature Granulocytes 0 %   Abs Immature Granulocytes 0.03 0.00 - 0.07 K/uL    Comment: Performed at [facility], [address], [place], [place] [address]  Hepatitis panel, acute     Status: None   Collection Time: 11/09/21  8:35 PM  Result Value Ref Range   Hepatitis B Surface Ag NON REACTIVE NON REACTIVE   HCV Ab NON REACTIVE NON REACTIVE    Comment: (NOTE) Nonreactive HCV antibody screen is consistent with no HCV infections,  unless recent infection is suspected or other evidence exists to indicate HCV infection.      Hep A IgM NON REACTIVE NON REACTIVE   Hep B C IgM NON REACTIVE NON REACTIVE    Comment: Performed at [facility], [place] [address], [place], [place] [address]  Lipase, blood     Status: None   Collection Time: 11/09/21  8:36 PM  Result Value Ref Range   Lipase 43 11 - 51 U/L    Comment: Performed at [facility], [address], [place], [place] [address]  Comprehensive metabolic panel     Status: Abnormal   Collection Time: 11/09/21  8:39 PM  Result Value Ref Range   Sodium 138 135 - 145 mmol/L   Potassium 3.8 3.5 - 5.1 mmol/L   Chloride 98 98 - 111 mmol/L   CO2 28 22 - 32 mmol/L   Glucose, Bld 219 (H) 70 - 99 mg/dL    Comment: Glucose reference range applies only to samples taken after fasting for at least 8 hours.   BUN 13 8 - 23 mg/dL   Creatinine, Ser 0.98 0.44 - 1.00 mg/dL   Calcium 9.5 8.9 - 10.3 mg/dL   Total Protein 8.7 (H) 6.5 - 8.1 g/dL   Albumin 4.1 3.5 - 5.0 g/dL   AST 1,494 (H) 15 - 41 U/L   ALT 911 (H) 0 - 44 U/L   Alkaline Phosphatase 227 (H) 38 - 126 U/L   Total Bilirubin 2.7 (H) 0.3 - 1.2 mg/dL   GFR, Estimated >60 >60 mL/min    Comment: (NOTE) Calculated using the CKD-EPI Creatinine Equation (2021)    Anion gap 12 5 - 15    Comment: Performed at [facility], [address], [place],  [address]  Glucose, capillary     Status: Abnormal   Collection Time: 11/10/21  1:34 AM  Result Value Ref Range   Glucose-Capillary 176 (H) 70 - 99 mg/dL    Comment:  Glucose reference range applies only to samples taken after fasting for at least 8 hours.  Comprehensive metabolic panel     Status: Abnormal   Collection Time: 11/10/21  4:28 AM  Result Value Ref Range   Sodium 139 135 - 145 mmol/L   Potassium 3.2 (L) 3.5 - 5.1 mmol/L   Chloride 107 98 - 111 mmol/L   CO2 25 22 - 32 mmol/L   Glucose, Bld 162 (H) 70 - 99 mg/dL    Comment: Glucose reference range applies only to samples taken after fasting for at least 8 hours.   BUN 11 8 - 23 mg/dL   Creatinine, Ser  0.86 0.44 - 1.00 mg/dL   Calcium 7.8 (L) 8.9 - 10.3 mg/dL   Total Protein 6.1 (L) 6.5 - 8.1 g/dL   Albumin 3.0 (L) 3.5 - 5.0 g/dL   AST 769 (H) 15 - 41 U/L   ALT 602 (H) 0 - 44 U/L   Alkaline Phosphatase 162 (H) 38 - 126 U/L   Total Bilirubin 2.4 (H) 0.3 - 1.2 mg/dL   GFR, Estimated >60 >60 mL/min    Comment: (NOTE) Calculated using the CKD-EPI Creatinine Equation (2021)    Anion gap 7 5 - 15    Comment: Performed at [facility], [address], [place], [place] [address]  Magnesium     Status: None   Collection Time: 11/10/21  4:28 AM  Result Value Ref Range   Magnesium 1.9 1.7 - 2.4 mg/dL    Comment: Performed at [facility], [address], [place], [place] [address]  CBC with Differential/Platelet     Status: Abnormal   Collection Time: 11/10/21  4:28 AM  Result Value Ref Range   WBC 5.7 4.0 - 10.5 K/uL   RBC 3.88 3.87 - 5.11 MIL/uL   Hemoglobin 10.9 (L) 12.0 - 15.0 g/dL   HCT 33.0 (L) 36.0 - 46.0 %   MCV 85.1 80.0 - 100.0 fL   MCH 28.1 26.0 - 34.0 pg   MCHC 33.0 30.0 - 36.0 g/dL   RDW 16.8 (H) 11.5 - 15.5 %   Platelets 175 150 - 400 K/uL   nRBC 0.0 0.0 - 0.2 %   Neutrophils Relative % 70 %   Neutro Abs 3.9 1.7 - 7.7 K/uL   Lymphocytes Relative 18 %   Lymphs Abs 1.0 0.7 - 4.0 K/uL   Monocytes Relative 11 %   Monocytes Absolute 0.6 0.1 - 1.0 K/uL   Eosinophils Relative 1 %   Eosinophils Absolute 0.1 0.0 - 0.5 K/uL   Basophils Relative 0 %   Basophils Absolute 0.0 0.0 - 0.1 K/uL   Immature Granulocytes 0 %   Abs Immature Granulocytes 0.02 0.00 - 0.07 K/uL    Comment: Performed at [facility], [address], [place], [place] [address]  Glucose, capillary     Status: Abnormal   Collection Time: 11/10/21  6:03 AM  Result Value Ref Range   Glucose-Capillary 193 (H) 70 - 99 mg/dL    Comment: Glucose reference range applies only to samples taken after fasting for at least 8 hours.  Glucose, capillary     Status: Abnormal   Collection Time: 11/10/21 11:12 AM  Result  Value Ref Range   Glucose-Capillary 204 (H) 70 - 99 mg/dL    Comment: Glucose reference range applies only to samples taken after fasting for at least 8 hours.  Glucose, capillary     Status: Abnormal   Collection Time: 11/10/21  4:11  PM  Result Value Ref Range   Glucose-Capillary 148 (H) 70 - 99 mg/dL    Comment: Glucose reference range applies only to samples taken after fasting for at least 8 hours.  Glucose, capillary     Status: Abnormal   Collection Time: 11/10/21  8:23 PM  Result Value Ref Range   Glucose-Capillary 153 (H) 70 - 99 mg/dL    Comment: Glucose reference range applies only to samples taken after fasting for at least 8 hours.   Comment 1 Notify RN    Comment 2 Document in Chart   Glucose, capillary     Status: Abnormal   Collection Time: 11/11/21 12:42 AM  Result Value Ref Range   Glucose-Capillary 131 (H) 70 - 99 mg/dL    Comment: Glucose reference range applies only to samples taken after fasting for at least 8 hours.   Comment 1 Notify RN    Comment 2 Document in Chart   Hepatic function panel     Status: Abnormal   Collection Time: 11/11/21  4:25 AM  Result Value Ref Range   Total Protein 6.6 6.5 - 8.1 g/dL   Albumin 3.1 (L) 3.5 - 5.0 g/dL   AST 476 (H) 15 - 41 U/L   ALT 565 (H) 0 - 44 U/L   Alkaline Phosphatase 166 (H) 38 - 126 U/L   Total Bilirubin 2.2 (H) 0.3 - 1.2 mg/dL   Bilirubin, Direct 1.1 (H) 0.0 - 0.2 mg/dL   Indirect Bilirubin 1.1 (H) 0.3 - 0.9 mg/dL    Comment: Performed at [facility], [address] [place], [place] [address]  Glucose, capillary     Status: Abnormal   Collection Time: 11/11/21  7:01 AM  Result Value Ref Range   Glucose-Capillary 130 (H) 70 - 99 mg/dL    Comment: Glucose reference range applies only to samples taken after fasting for at least 8 hours.  Glucose, capillary     Status: Abnormal   Collection Time: 11/11/21 11:06 AM  Result Value Ref Range   Glucose-Capillary 157 (H) 70 - 99 mg/dL    Comment: Glucose reference  range applies only to samples taken after fasting for at least 8 hours.    US Abdomen Limited RUQ (LIVER/GB)  Result Date: 11/10/2021 CLINICAL DATA:  Transaminitis. EXAM: ULTRASOUND ABDOMEN LIMITED RIGHT UPPER QUADRANT COMPARISON:  CT, 11/09/2021. FINDINGS: Gallbladder: Dependent stones. Mildly distended. No wall thickening or pericholecystic fluid. Common bile duct: Diameter: 4 mm Liver: Limited visualization. Increased parenchymal echogenicity. No defined mass. Portal vein is patent on color Doppler imaging with normal direction of blood flow towards the liver. Other: None. IMPRESSION: 1. Limited exam.  No acute findings. 2. Increased liver parenchymal echogenicity consistent with hepatic steatosis. 3. Cholelithiasis. Electronically Signed   By: Lajean Manes M.D.   On: 11/10/2021 10:39   CT Abdomen Pelvis W Contrast  Result Date: 11/09/2021 CLINICAL DATA:  Acute abdominal pain. EXAM: CT ABDOMEN AND PELVIS WITH CONTRAST TECHNIQUE: Multidetector CT imaging of the abdomen and pelvis was performed using the standard protocol following bolus administration of intravenous contrast. RADIATION DOSE REDUCTION: This exam was performed according to the departmental dose-optimization program which includes automated exposure control, adjustment of the mA and/or kV according to patient size and/or use of iterative reconstruction technique. CONTRAST:  65m OMNIPAQUE IOHEXOL 300 MG/ML  SOLN COMPARISON:  CT abdomen and pelvis 08/31/2021 FINDINGS: Lower chest: No acute abnormality. Hepatobiliary: No focal liver abnormality is seen. No gallstones, gallbladder wall thickening, or biliary dilatation. Pancreas:  Unremarkable. No pancreatic ductal dilatation or surrounding inflammatory changes. Spleen: Normal in size without focal abnormality. Adrenals/Urinary Tract: There is a 2.5 cm left renal cyst, unchanged. Otherwise, the kidneys, adrenal glands and bladder are within normal limits. Stomach/Bowel: Stomach is within  normal limits. Appendix appears normal. No evidence of bowel wall thickening, distention, or inflammatory changes. Vascular/Lymphatic: Aortic atherosclerosis. No enlarged abdominal or pelvic lymph nodes. Reproductive: Calcified uterine fibroid seen measuring 1 cm. No adnexal mass. Other: No abdominal wall hernia or abnormality. No abdominopelvic ascites. Musculoskeletal: Degenerative changes affect the spine. IMPRESSION: 1. No acute localizing process in the abdomen or pelvis. 2. Left Bosniak I benign renal cyst measuring 2.5 cm. No follow-up imaging is recommended. JACR 2018 Feb; 264-273, Management of the Incidental Renal Mass on CT, [employer] 2021; 814-848, Bosniak Classification of Cystic Renal Masses, Version 2019. 3. Calcified uterine fibroid. Aortic Atherosclerosis (ICD10-I70.0). Electronically Signed   By: Ronney Asters M.D.   On: 11/09/2021 22:41   DG Chest Portable 1 View  Result Date: 11/09/2021 CLINICAL DATA:  Shortness of breath EXAM: PORTABLE CHEST 1 VIEW COMPARISON:  Chest x-ray 04/08/2015 FINDINGS: There is some atelectatic changes in the lower lungs. There is no lung consolidation, pleural effusion or pneumothorax. Cardiomediastinal silhouette is within normal limits. Surgical clips overlie the lower neck, unchanged. No acute fractures. IMPRESSION: No active disease. Atelectatic changes in the lower lungs. Electronically Signed   By: Ronney Asters M.D.   On: 11/09/2021 22:10    ROS:  A comprehensive review of systems was negative except for: Musculoskeletal: positive for bilateral knee pain  Blood pressure 137/65, pulse 71, temperature 98.5 F (36.9 C), resp. rate 19, height 5' 4.5" (1.638 m), weight 135.2 kg, SpO2 98 %. Physical Exam:  Gen: Well appearing in no distress Pulm: Normal work of breathing. No wheezing, rhales, or crackles. CV: Normal rate. Normal S1 and S2 no murmurs. Abd: Normal bowel sounds. No scars. No tenderness to palpation, guarding, rigidity, or rebound.  Negative Murphy's Sign.  Assessment/Plan: Cholelithiasis with concern for choledocholithiasis -RUQ [place] notable for cholelithiasis with CBD 21m -Plan for cholecystectomy with intraoperative cholangiogram on 11/1 -Continue Zosyn  Transaminitis  -LFTs and bilirubin continue to downtrend: AST/ALT 476/565, ALP 166, Bili 2.2 -Continue to monitor labs  Atrial flutter -Continue to hold Eliquis for 2 days for surgery on 11/1 (held since 10/29 9am) -Last Echo 2017 LVEF 60-65% -Will repeat echo today  FEN/GI -Advance diet to heart healthy as tolerated -Keep patient NPO 10/31 midnight for 11/1 surgery  PPX -SCDs  ALeonette Nutting MS3 11/11/2021, 12:42 PM

## 2021-11-12 ENCOUNTER — Inpatient Hospital Stay (HOSPITAL_COMMUNITY): Payer: Medicare Other

## 2021-11-12 DIAGNOSIS — I10 Essential (primary) hypertension: Secondary | ICD-10-CM | POA: Diagnosis not present

## 2021-11-12 DIAGNOSIS — E039 Hypothyroidism, unspecified: Secondary | ICD-10-CM | POA: Diagnosis not present

## 2021-11-12 DIAGNOSIS — R109 Unspecified abdominal pain: Secondary | ICD-10-CM | POA: Diagnosis not present

## 2021-11-12 DIAGNOSIS — Z01810 Encounter for preprocedural cardiovascular examination: Secondary | ICD-10-CM

## 2021-11-12 DIAGNOSIS — R6510 Systemic inflammatory response syndrome (SIRS) of non-infectious origin without acute organ dysfunction: Secondary | ICD-10-CM | POA: Diagnosis not present

## 2021-11-12 LAB — COMPREHENSIVE METABOLIC PANEL
ALT: 352 U/L — ABNORMAL HIGH (ref 0–44)
AST: 237 U/L — ABNORMAL HIGH (ref 15–41)
Albumin: 2.8 g/dL — ABNORMAL LOW (ref 3.5–5.0)
Alkaline Phosphatase: 142 U/L — ABNORMAL HIGH (ref 38–126)
Anion gap: 7 (ref 5–15)
BUN: 7 mg/dL — ABNORMAL LOW (ref 8–23)
CO2: 25 mmol/L (ref 22–32)
Calcium: 7.7 mg/dL — ABNORMAL LOW (ref 8.9–10.3)
Chloride: 109 mmol/L (ref 98–111)
Creatinine, Ser: 1.01 mg/dL — ABNORMAL HIGH (ref 0.44–1.00)
GFR, Estimated: >60 mL/min (ref >60)
Glucose, Bld: 95 mg/dL (ref 70–99)
Potassium: 3.3 mmol/L — ABNORMAL LOW (ref 3.5–5.1)
Sodium: 141 mmol/L (ref 135–145)
Total Bilirubin: 1 mg/dL (ref 0.3–1.2)
Total Protein: 5.9 g/dL — ABNORMAL LOW (ref 6.5–8.1)

## 2021-11-12 LAB — CBC
HCT: 30.7 % — ABNORMAL LOW (ref 36.0–46.0)
Hemoglobin: 9.7 g/dL — ABNORMAL LOW (ref 12.0–15.0)
MCH: 27.8 pg (ref 26.0–34.0)
MCHC: 31.6 g/dL (ref 30.0–36.0)
MCV: 88 fL (ref 80.0–100.0)
Platelets: 150 10*3/uL (ref 150–400)
RBC: 3.49 MIL/uL — ABNORMAL LOW (ref 3.87–5.11)
RDW: 16.9 % — ABNORMAL HIGH (ref 11.5–15.5)
WBC: 4.7 10*3/uL (ref 4.0–10.5)
nRBC: 0 % (ref 0.0–0.2)

## 2021-11-12 LAB — GLUCOSE, CAPILLARY
Glucose-Capillary: 114 mg/dL — ABNORMAL HIGH (ref 70–99)
Glucose-Capillary: 122 mg/dL — ABNORMAL HIGH (ref 70–99)
Glucose-Capillary: 124 mg/dL — ABNORMAL HIGH (ref 70–99)
Glucose-Capillary: 132 mg/dL — ABNORMAL HIGH (ref 70–99)
Glucose-Capillary: 139 mg/dL — ABNORMAL HIGH (ref 70–99)
Glucose-Capillary: 141 mg/dL — ABNORMAL HIGH (ref 70–99)
Glucose-Capillary: 82 mg/dL (ref 70–99)

## 2021-11-12 LAB — ECHOCARDIOGRAM COMPLETE
AR max vel: 2.85 cm2
AV Area VTI: 3.13 cm2
AV Area mean vel: 2.8 cm2
AV Mean grad: 3 mmHg
AV Peak grad: 6.6 mmHg
Ao pk vel: 1.28 m/s
Area-P 1/2: 3.17 cm2
Height: 64.5 in
MV VTI: 2.99 cm2
S' Lateral: 3 cm
Weight: 4768 oz

## 2021-11-12 LAB — TYPE AND SCREEN
ABO/RH(D): A POS
Antibody Screen: NEGATIVE

## 2021-11-12 MED ORDER — SODIUM CHLORIDE 0.9 % IV SOLN
2.0000 g | INTRAVENOUS | Status: AC
  Administered 2021-11-13: 2 g via INTRAVENOUS
  Filled 2021-11-12 (×2): qty 2

## 2021-11-12 MED ORDER — CHLORHEXIDINE GLUCONATE CLOTH 2 % EX PADS
6.0000 | MEDICATED_PAD | Freq: Once | CUTANEOUS | Status: AC
  Administered 2021-11-12: 6 via TOPICAL

## 2021-11-12 MED ORDER — POTASSIUM CHLORIDE CRYS ER 20 MEQ PO TBCR
40.0000 meq | EXTENDED_RELEASE_TABLET | ORAL | Status: AC
  Administered 2021-11-12 – 2021-11-13 (×2): 40 meq via ORAL
  Filled 2021-11-12 (×2): qty 2

## 2021-11-12 MED ORDER — PERFLUTREN LIPID MICROSPHERE
1.0000 mL | INTRAVENOUS | Status: AC | PRN
  Administered 2021-11-12: 5 mL via INTRAVENOUS

## 2021-11-12 NOTE — Anesthesia Preprocedure Evaluation (Addendum)
Anesthesia Evaluation  Patient identified by MRN, date of birth, ID band Patient awake    Reviewed: Allergy & Precautions, NPO status , Patient's Chart, lab work & pertinent test results, reviewed documented beta blocker date and time   Airway Mallampati: III  TM Distance: >3 FB Neck ROM: Full    Dental  (+) Dental Advisory Given, Loose,  Slightly loose teeth:   Pulmonary asthma , sleep apnea and Continuous Positive Airway Pressure Ventilation , former smoker   Pulmonary exam normal breath sounds clear to auscultation       Cardiovascular Exercise Tolerance: Poor hypertension, Pt. on medications and Pt. on home beta blockers Normal cardiovascular exam Rhythm:Regular Rate:Normal  1. Left ventricular ejection fraction, by estimation, is 65 to 70%. The  left ventricle has normal function. The left ventricle has no regional  wall motion abnormalities. There is moderate concentric left ventricular  hypertrophy. Left ventricular diastolic parameters were normal.   2. Right ventricular systolic function is normal. The right ventricular  size is normal. Tricuspid regurgitation signal is inadequate for assessing  PA pressure.   3. The mitral valve is grossly normal, mildly calcified. Trivial mitral  valve regurgitation.   4. The aortic valve was not well visualized. There is mild calcification  of the aortic valve. Aortic valve regurgitation is not visualized. Aortic  valve sclerosis is present, with no evidence of aortic valve stenosis.  Aortic valve mean gradient measures   3.0 mmHg.   5. The inferior vena cava is normal in size with greater than 50%  respiratory variability, suggesting right atrial pressure of 3 mmHg.      Neuro/Psych  PSYCHIATRIC DISORDERS  Depression     Neuromuscular disease    GI/Hepatic Neg liver ROS,GERD  Medicated and Controlled,,  Endo/Other  diabetes, Well Controlled, Type 2, Oral Hypoglycemic Agents,  Insulin DependentHypothyroidism  Morbid obesity  Renal/GU negative Renal ROS  negative genitourinary   Musculoskeletal  (+) Arthritis , Osteoarthritis,    Abdominal   Peds negative pediatric ROS (+)  Hematology negative hematology ROS (+)   Anesthesia Other Findings Right breast cancer resection and axillary lymph node removal. No IVs and BP CUFFS on right upper extremity  Reproductive/Obstetrics negative OB ROS                             Anesthesia Physical Anesthesia Plan  ASA: 3  Anesthesia Plan: General   Post-op Pain Management: Dilaudid IV   Induction: Intravenous, Rapid sequence and Cricoid pressure planned  PONV Risk Score and Plan: 4 or greater and Ondansetron and Metaclopromide  Airway Management Planned: Oral ETT  Additional Equipment:   Intra-op Plan:   Post-operative Plan: Extubation in OR  Informed Consent: I have reviewed the patients History and Physical, chart, labs and discussed the procedure including the risks, benefits and alternatives for the proposed anesthesia with the patient or authorized representative who has indicated his/her understanding and acceptance.     Dental advisory given  Plan Discussed with:   Anesthesia Plan Comments:         Anesthesia Quick Evaluation

## 2021-11-12 NOTE — Progress Notes (Signed)
PROGRESS NOTE    [name]  QPY:[id number] DOB: 1955-02-18 DOA: 11/09/2021 PCP: Coral Spikes, DO    Brief Narrative:  66 y/o female with history of DM2, HTN, OSA, Obesity class 3, admitted to the hospital with abdominal pain and elevated LFTs. Noted to have cholelithiasis and similar presentation earlier this year. LFTs trending down with IV fluids. Concern that patient has been passing stones. Currently no evidence of choledocholithiasis on imaging (CT, [place]). GI following. Gen surgery following and plans on cholecystectomy 11/1   Assessment & Plan:   Active Problems:   Hypothyroidism   Obstructive sleep apnea   Hypertension   Type 2 diabetes mellitus with hyperlipidemia (HCC)   Transaminitis   Knee pain   SIRS (systemic inflammatory response syndrome) (HCC)   Gallstones   Transaminitis Cholelithiasis -Concern for recurrent choledocholithiasis -CT imaging and ultrasound imaging did not reveal any stones and bile duct or bile duct dilatation -She is noted to have cholelithiasis -Overall LFTs are trending down since admission -AST 1494->237 -ALT 911->352 -ALP 227->142 -Bili 2.7->1.0 -GI following, appreciate input -Continue IV fluids and recheck labs in a.m. -Gen surgery following and plans on cholecystectomy 11/1 -Pre-op echo without any significant findings, EF preserved.      SIRS -Noted to be febrile, tachycardic and tachypneic -Started on broad-spectrum antibiotics for intra abdominal process -Overall hemodynamics are improving   Type 2 diabetes -Continued on 70/30 insulin -Continue sliding scale -Blood sugar stable   Atrial flutter -Continue on metoprolol -She is on chronic anticoagulation with Eliquis. Last dose of Eliquis was a.m. of 10/29   Hypertension -Blood pressures currently stable -Holding amlodipine and benazepril -Continue metoprolol for now   Bilateral osteoarthritis of knees -Severely debilitating, mostly wheelchair-bound  now -Wishes to pursue operative management, but will need to lose weight prior to being considered for knee replacements   Obstructive sleep apnea -Continue CPAP   Hypothyroidism -Continue Synthroid   Obesity, class III -BMI 50.3 -Discussed the importance of healthy eating habits and physical activity  Hypokalemia -replace   DVT prophylaxis: SCD's Start: 11/12/21 1340 SCDs Start: 11/10/21 0024  Code Status: full code Family Communication: discussed with patient Disposition Plan: Status is: Inpatient Remains inpatient appropriate because: awaiting surgical management of cholelithiasis     Consultants:  GI Gen surgery  Procedures:    Antimicrobials:  Zosyn 10/29>    Subjective: No abdominal pain or vomiting  Objective: Vitals:   11/11/21 1951 11/12/21 0557 11/12/21 0924 11/12/21 1318  BP: (!) 138/54 138/68  107/79  Pulse: 78 66 68 66  Resp: 20 20    Temp: 99 F (37.2 C) 98.3 F (36.8 C)  98.6 F (37 C)  TempSrc: Oral Oral  Oral  SpO2: 97% 96%  98%  Weight:      Height:        Intake/Output Summary (Last 24 hours) at 11/12/2021 1953 Last data filed at 11/12/2021 1839 Gross per 24 hour  Intake 600 ml  Output 2550 ml  Net -1950 ml   Filed Weights   11/09/21 2011  Weight: 135.2 kg    Examination:  General exam: Appears calm and comfortable  Respiratory system: Clear to auscultation. Respiratory effort normal. Cardiovascular system: S1 & S2 heard, RRR. No JVD, murmurs, rubs, gallops or clicks. No pedal edema. Gastrointestinal system: Abdomen is nondistended, soft and nontender. No organomegaly or masses felt. Normal bowel sounds heard. Central nervous system: Alert and oriented. No focal neurological deficits. Extremities: Symmetric 5 x 5 power. Skin: No  rashes, lesions or ulcers Psychiatry: Judgement and insight appear normal. Mood & affect appropriate.     Data Reviewed: I have personally reviewed following labs and imaging  studies  CBC: Recent Labs  Lab 11/06/21 1227 11/09/21 2035 11/10/21 0428 11/12/21 0328  WBC 5.5 8.2 5.7 4.7  NEUTROABS  --  6.8 3.9  --   HGB 12.3 13.7 10.9* 9.7*  HCT 38.7 41.0 33.0* 30.7*  MCV 87 82.8 85.1 88.0  PLT 215 255 175 975   Basic Metabolic Panel: Recent Labs  Lab 11/06/21 1227 11/09/21 2039 11/10/21 0428 11/12/21 0328  NA 142 138 139 141  K 4.4 3.8 3.2* 3.3*  CL 101 98 107 109  CO2 '25 28 25 25  '$ GLUCOSE 144* 219* 162* 95  BUN '10 13 11 '$ 7*  CREATININE 0.89 0.98 0.86 1.01*  CALCIUM 9.2 9.5 7.8* 7.7*  MG  --   --  1.9  --    GFR: Estimated Creatinine Clearance: 74.7 mL/min (A) (by C-G formula based on SCr of 1.01 mg/dL (H)). Liver Function Tests: Recent Labs  Lab 11/06/21 1227 11/09/21 2039 11/10/21 0428 11/11/21 0425 11/12/21 0328  AST 21 1,494* 769* 476* 237*  ALT 15 911* 602* 565* 352*  ALKPHOS 93 227* 162* 166* 142*  BILITOT 0.3 2.7* 2.4* 2.2* 1.0  PROT 7.2 8.7* 6.1* 6.6 5.9*  ALBUMIN 4.2 4.1 3.0* 3.1* 2.8*   Recent Labs  Lab 11/09/21 2036  LIPASE 43   No results for input(s): "AMMONIA" in the last 168 hours. Coagulation Profile: No results for input(s): "INR", "PROTIME" in the last 168 hours. Cardiac Enzymes: No results for input(s): "CKTOTAL", "CKMB", "CKMBINDEX", "TROPONINI" in the last 168 hours. BNP (last 3 results) No results for input(s): "PROBNP" in the last 8760 hours. HbA1C: No results for input(s): "HGBA1C" in the last 72 hours. CBG: Recent Labs  Lab 11/11/21 1615 11/12/21 0006 11/12/21 0615 11/12/21 1137 11/12/21 1721  GLUCAP 155* 124* 122* 141* 82   Lipid Profile: No results for input(s): "CHOL", "HDL", "LDLCALC", "TRIG", "CHOLHDL", "LDLDIRECT" in the last 72 hours. Thyroid Function Tests: No results for input(s): "TSH", "T4TOTAL", "FREET4", "T3FREE", "THYROIDAB" in the last 72 hours. Anemia Panel: No results for input(s): "VITAMINB12", "FOLATE", "FERRITIN", "TIBC", "IRON", "RETICCTPCT" in the last 72  hours. Sepsis Labs: No results for input(s): "PROCALCITON", "LATICACIDVEN" in the last 168 hours.  No results found for this or any previous visit (from the past 240 hour(s)).       Radiology Studies: ECHOCARDIOGRAM COMPLETE  Result Date: 11/12/2021    ECHOCARDIOGRAM REPORT   Patient Name:   [name] Date of Exam: 11/12/2021 Medical Rec #:  [id number]        Height:       64.5 in Accession #:    [id number]       Weight:       298.0 lb Date of Birth:  1955-10-29        BSA:          2.330 m Patient Age:    86 years         BP:           138/68 mmHg Patient Gender: F                HR:           67 bpm. Exam Location:  [facility] Procedure: 2D Echo, Cardiac Doppler, Color Doppler and Intracardiac  Opacification Agent Indications:    Pre-operative cardiovascular examination  History:        Patient has prior history of Echocardiogram examinations, most                 recent 02/15/2015. CHF, Arrythmias:Atrial Flutter; Risk                 Factors:Hypertension, Diabetes and Former Smoker. Breast CA.  Sonographer:    Wenda Low Referring Phys: [phone] [facility] Urias Sheek  Sonographer Comments: Technically difficult study due to poor echo windows and patient is obese. IMPRESSIONS  1. Left ventricular ejection fraction, by estimation, is 65 to 70%. The left ventricle has normal function. The left ventricle has no regional wall motion abnormalities. There is moderate concentric left ventricular hypertrophy. Left ventricular diastolic parameters were normal.  2. Right ventricular systolic function is normal. The right ventricular size is normal. Tricuspid regurgitation signal is inadequate for assessing PA pressure.  3. The mitral valve is grossly normal, mildly calcified. Trivial mitral valve regurgitation.  4. The aortic valve was not well visualized. There is mild calcification of the aortic valve. Aortic valve regurgitation is not visualized. Aortic valve sclerosis is present, with no evidence  of aortic valve stenosis. Aortic valve mean gradient measures  3.0 mmHg.  5. The inferior vena cava is normal in size with greater than 50% respiratory variability, suggesting right atrial pressure of 3 mmHg. Comparison(s): Prior images unable to be directly viewed. FINDINGS  Left Ventricle: Left ventricular ejection fraction, by estimation, is 65 to 70%. The left ventricle has normal function. The left ventricle has no regional wall motion abnormalities. Definity contrast agent was given IV to delineate the left ventricular  endocardial borders. The left ventricular internal cavity size was normal in size. There is moderate concentric left ventricular hypertrophy. Left ventricular diastolic parameters were normal. Right Ventricle: The right ventricular size is normal. No increase in right ventricular wall thickness. Right ventricular systolic function is normal. Tricuspid regurgitation signal is inadequate for assessing PA pressure. Left Atrium: Left atrial size was normal in size. Right Atrium: Right atrial size was normal in size. Pericardium: There is no evidence of pericardial effusion. Mitral Valve: The mitral valve is grossly normal. There is mild calcification of the mitral valve leaflet(s). Trivial mitral valve regurgitation. MV peak gradient, 3.1 mmHg. The mean mitral valve gradient is 1.0 mmHg. Tricuspid Valve: The tricuspid valve is grossly normal. Tricuspid valve regurgitation is trivial. Aortic Valve: The aortic valve was not well visualized. There is mild calcification of the aortic valve. Aortic valve regurgitation is not visualized. Aortic valve sclerosis is present, with no evidence of aortic valve stenosis. Aortic valve mean gradient measures 3.0 mmHg. Aortic valve peak gradient measures 6.6 mmHg. Aortic valve area, by VTI measures 3.13 cm. Pulmonic Valve: The pulmonic valve was not well visualized. Pulmonic valve regurgitation is trivial. Aorta: The aortic root is normal in size and structure.  Venous: The inferior vena cava is normal in size with greater than 50% respiratory variability, suggesting right atrial pressure of 3 mmHg. IAS/Shunts: No atrial level shunt detected by color flow Doppler.  LEFT VENTRICLE PLAX 2D LVIDd:         4.80 cm   Diastology LVIDs:         3.00 cm   LV e' medial:    9.36 cm/s LV PW:         1.30 cm   LV E/e' medial:  7.3 LV IVS:  1.30 cm   LV e' lateral:   8.27 cm/s LVOT diam:     2.00 cm   LV E/e' lateral: 8.2 LV SV:         84 LV SV Index:   36 LVOT Area:     3.14 cm  LEFT ATRIUM           Index LA diam:      3.90 cm 1.67 cm/m LA Vol (A4C): 50.4 ml 21.63 ml/m  AORTIC VALVE AV Area (Vmax):    2.85 cm AV Area (Vmean):   2.80 cm AV Area (VTI):     3.13 cm AV Vmax:           128.00 cm/s AV Vmean:          84.600 cm/s AV VTI:            0.267 m AV Peak Grad:      6.6 mmHg AV Mean Grad:      3.0 mmHg LVOT Vmax:         116.00 cm/s LVOT Vmean:        75.300 cm/s LVOT VTI:          0.266 m LVOT/AV VTI ratio: 1.00  AORTA Ao Root diam: 3.10 cm MITRAL VALVE MV Area (PHT): 3.17 cm    SHUNTS MV Area VTI:   2.99 cm    Systemic VTI:  0.27 m MV Peak grad:  3.1 mmHg    Systemic Diam: 2.00 cm MV Mean grad:  1.0 mmHg MV Vmax:       0.88 m/s MV Vmean:      51.9 cm/s MV Decel Time: 239 msec MV E velocity: 68.10 cm/s MV A velocity: 87.40 cm/s MV E/A ratio:  0.78 Rozann Lesches MD Electronically signed by Rozann Lesches MD Signature Date/Time: 11/12/2021/10:42:41 AM    Final         Scheduled Meds:  Chlorhexidine Gluconate Cloth  6 each Topical Once   insulin aspart  0-15 Units Subcutaneous Q6H   insulin aspart  0-5 Units Subcutaneous QHS   levothyroxine  100 mcg Oral Q0600   metoprolol succinate  100 mg Oral Daily   pantoprazole  40 mg Oral BID AC   potassium chloride  40 mEq Oral Q4H   pregabalin  100 mg Oral TID   Continuous Infusions:  sodium chloride 75 mL/hr at 11/12/21 0559   [START ON 11/13/2021] cefoTEtan (CEFOTAN) IV     piperacillin-tazobactam 3.375 g  (11/12/21 1156)     LOS: 3 days    Time spent: 35mns    JKathie Dike MD [facility]   If 7PM-7AM, please contact night-coverage [email or web address]  11/12/2021, 7:53 PM

## 2021-11-12 NOTE — Progress Notes (Signed)
*  PRELIMINARY RESULTS* Echocardiogram 2D Echocardiogram has been performed.  [name] 11/12/2021, 10:25 AM

## 2021-11-12 NOTE — Progress Notes (Signed)
Patient using home CPAP unit independently. RT checked on patient to make sure it was plugged into red outlet and that she had enough sterile water.

## 2021-11-12 NOTE — Progress Notes (Signed)
  Subjective: [name] is doing well this morning. She denies nausea, vomiting, abdominal pain, fevers, and chills. She is ready for her surgery tomorrow.  Objective: Vital signs in last 24 hours: Temp:  [98.3 F (36.8 C)-99 F (37.2 C)] 98.3 F (36.8 C) (10/31 0557) Pulse Rate:  [66-78] 66 (10/31 0557) Resp:  [19-20] 20 (10/31 0557) BP: (136-138)/(54-68) 138/68 (10/31 0557) SpO2:  [93 %-98 %] 96 % (10/31 0557) Last BM Date : 11/09/21  Intake/Output from previous day: 10/30 0701 - 10/31 0700 In: 720 [P.O.:720] Out: 1400 [Urine:1400] Intake/Output this shift: No intake/output data recorded.  General appearance: alert and no distress Resp: clear to auscultation bilaterally Cardio: regular rate and rhythm, S1, S2 normal, no murmur, click, rub or gallop Abd: Normal bowel sounds. Nontender to palpation.  Lab Results:  Recent Labs    11/10/21 0428 11/12/21 0328  WBC 5.7 4.7  HGB 10.9* 9.7*  HCT 33.0* 30.7*  PLT 175 150   BMET Recent Labs    11/10/21 0428 11/12/21 0328  NA 139 141  K 3.2* 3.3*  CL 107 109  CO2 25 25  GLUCOSE 162* 95  BUN 11 7*  CREATININE 0.86 1.01*  CALCIUM 7.8* 7.7*   PT/INR No results for input(s): "LABPROT", "INR" in the last 72 hours.  Studies/Results: US Abdomen Limited RUQ (LIVER/GB)  Result Date: 11/10/2021 CLINICAL DATA:  Transaminitis. EXAM: ULTRASOUND ABDOMEN LIMITED RIGHT UPPER QUADRANT COMPARISON:  CT, 11/09/2021. FINDINGS: Gallbladder: Dependent stones. Mildly distended. No wall thickening or pericholecystic fluid. Common bile duct: Diameter: 4 mm Liver: Limited visualization. Increased parenchymal echogenicity. No defined mass. Portal vein is patent on color Doppler imaging with normal direction of blood flow towards the liver. Other: None. IMPRESSION: 1. Limited exam.  No acute findings. 2. Increased liver parenchymal echogenicity consistent with hepatic steatosis. 3. Cholelithiasis. Electronically Signed   By: Lajean Manes M.D.   On: 11/10/2021 10:39    Anti-infectives: Anti-infectives (From admission, onward)    Start     Dose/Rate Route Frequency Ordered Stop   11/10/21 0400  piperacillin-tazobactam (ZOSYN) IVPB 3.375 g        3.375 g 12.5 mL/hr over 240 Minutes Intravenous Every 8 hours 11/10/21 0028     11/09/21 2115  piperacillin-tazobactam (ZOSYN) IVPB 3.375 g        3.375 g 100 mL/hr over 30 Minutes Intravenous  Once 11/09/21 2109 11/09/21 2206       Assessment/Plan: s/p Procedure(s): LAPAROSCOPIC CHOLECYSTECTOMY WITH INTRAOPERATIVE CHOLANGIOGRAM -Continue Zosyn for cholangitis prophylaxis -AST, ALT, ALP, and bilirubin continues to downtrend -Patient continues to be afebrile, WBC normal -Echo today -NPO at midnight for 11/1 surgery   LOS: 3 days    Leonette Nutting, MS3 11/12/2021

## 2021-11-12 NOTE — Progress Notes (Signed)
Informed consent signed for surgical procedure tomorrow , witnessed by this writer and placed in medical chart

## 2021-11-12 NOTE — Progress Notes (Signed)
Patient alert and oriented able to make needs known, patient will be NPO at midnight and will have Laparoscopic Cholecystectomy  tomorrow , Informed consent is signed and in chart. Patient c/o knee pain today PRN oxycodone helpful in managing pain . Peri-care provided to patient , no bath given , patient states she had one on night shift. Patient is able to swallow pills whole ,. Remains on IVF at this time. Call bell and hydration within reach .

## 2021-11-13 ENCOUNTER — Inpatient Hospital Stay (HOSPITAL_COMMUNITY): Payer: Medicare Other | Admitting: Anesthesiology

## 2021-11-13 ENCOUNTER — Encounter (HOSPITAL_COMMUNITY): Payer: Self-pay | Admitting: Family Medicine

## 2021-11-13 ENCOUNTER — Encounter (HOSPITAL_COMMUNITY)
Admission: EM | Disposition: A | Discharge status: Home Health | Payer: Self-pay | Source: Home / Self Care | Attending: Internal Medicine

## 2021-11-13 DIAGNOSIS — K810 Acute cholecystitis: Secondary | ICD-10-CM

## 2021-11-13 DIAGNOSIS — K820 Obstruction of gallbladder: Secondary | ICD-10-CM

## 2021-11-13 DIAGNOSIS — K8020 Calculus of gallbladder without cholecystitis without obstruction: Secondary | ICD-10-CM

## 2021-11-13 DIAGNOSIS — Z9989 Dependence on other enabling machines and devices: Secondary | ICD-10-CM

## 2021-11-13 DIAGNOSIS — G4733 Obstructive sleep apnea (adult) (pediatric): Secondary | ICD-10-CM

## 2021-11-13 HISTORY — PX: CHOLECYSTECTOMY: SHX55

## 2021-11-13 LAB — CBC
HCT: 35.6 % — ABNORMAL LOW (ref 36.0–46.0)
Hemoglobin: 11.7 g/dL — ABNORMAL LOW (ref 12.0–15.0)
MCH: 28.3 pg (ref 26.0–34.0)
MCHC: 32.9 g/dL (ref 30.0–36.0)
MCV: 86 fL (ref 80.0–100.0)
Platelets: 177 10*3/uL (ref 150–400)
RBC: 4.14 MIL/uL (ref 3.87–5.11)
RDW: 16.6 % — ABNORMAL HIGH (ref 11.5–15.5)
WBC: 4.9 10*3/uL (ref 4.0–10.5)
nRBC: 0 % (ref 0.0–0.2)

## 2021-11-13 LAB — COMPREHENSIVE METABOLIC PANEL
ALT: 291 U/L — ABNORMAL HIGH (ref 0–44)
AST: 145 U/L — ABNORMAL HIGH (ref 15–41)
Albumin: 3.2 g/dL — ABNORMAL LOW (ref 3.5–5.0)
Alkaline Phosphatase: 167 U/L — ABNORMAL HIGH (ref 38–126)
Anion gap: 8 (ref 5–15)
BUN: 6 mg/dL — ABNORMAL LOW (ref 8–23)
CO2: 26 mmol/L (ref 22–32)
Calcium: 8.6 mg/dL — ABNORMAL LOW (ref 8.9–10.3)
Chloride: 106 mmol/L (ref 98–111)
Creatinine, Ser: 1.08 mg/dL — ABNORMAL HIGH (ref 0.44–1.00)
GFR, Estimated: 56 mL/min — ABNORMAL LOW (ref >60)
Glucose, Bld: 121 mg/dL — ABNORMAL HIGH (ref 70–99)
Potassium: 4 mmol/L (ref 3.5–5.1)
Sodium: 140 mmol/L (ref 135–145)
Total Bilirubin: 1.1 mg/dL (ref 0.3–1.2)
Total Protein: 6.8 g/dL (ref 6.5–8.1)

## 2021-11-13 LAB — SURGICAL PCR SCREEN
MRSA, PCR: NEGATIVE
Staphylococcus aureus: NEGATIVE

## 2021-11-13 LAB — GLUCOSE, CAPILLARY
Glucose-Capillary: 151 mg/dL — ABNORMAL HIGH (ref 70–99)
Glucose-Capillary: 104 mg/dL — ABNORMAL HIGH (ref 70–99)
Glucose-Capillary: 151 mg/dL — ABNORMAL HIGH (ref 70–99)
Glucose-Capillary: 180 mg/dL — ABNORMAL HIGH (ref 70–99)

## 2021-11-13 SURGERY — LAPAROSCOPIC CHOLECYSTECTOMY
Anesthesia: General | Site: Abdomen

## 2021-11-13 MED ORDER — EPHEDRINE 5 MG/ML INJ
INTRAVENOUS | Status: AC
  Filled 2021-11-13: qty 10

## 2021-11-13 MED ORDER — LIDOCAINE HCL (PF) 2 % IJ SOLN
INTRAMUSCULAR | Status: AC
  Filled 2021-11-13: qty 5

## 2021-11-13 MED ORDER — CHLORHEXIDINE GLUCONATE 0.12 % MT SOLN: 15.0000 mL | Freq: Once | OROMUCOSAL | Status: DC

## 2021-11-13 MED ORDER — PHENYLEPHRINE 80 MCG/ML (10ML) SYRINGE FOR IV PUSH (FOR BLOOD PRESSURE SUPPORT)
PREFILLED_SYRINGE | INTRAVENOUS | Status: AC
  Filled 2021-11-13: qty 10

## 2021-11-13 MED ORDER — LACTATED RINGERS IV SOLN: INTRAVENOUS | Status: DC

## 2021-11-13 MED ORDER — PHENYLEPHRINE HCL (PRESSORS) 10 MG/ML IV SOLN
INTRAVENOUS | Status: DC | PRN
  Administered 2021-11-13: 160 ug via INTRAVENOUS

## 2021-11-13 MED ORDER — LIDOCAINE HCL (CARDIAC) PF 100 MG/5ML IV SOSY
PREFILLED_SYRINGE | INTRAVENOUS | Status: DC | PRN
  Administered 2021-11-13: 60 mg via INTRAVENOUS

## 2021-11-13 MED ORDER — DEXAMETHASONE SODIUM PHOSPHATE 10 MG/ML IJ SOLN
INTRAMUSCULAR | Status: AC
  Filled 2021-11-13: qty 1

## 2021-11-13 MED ORDER — DEXAMETHASONE SODIUM PHOSPHATE 10 MG/ML IJ SOLN
INTRAMUSCULAR | Status: DC | PRN
  Administered 2021-11-13: 5 mg via INTRAVENOUS

## 2021-11-13 MED ORDER — 0.9 % SODIUM CHLORIDE (POUR BTL) OPTIME
TOPICAL | Status: DC | PRN
  Administered 2021-11-13: 1000 mL

## 2021-11-13 MED ORDER — ONDANSETRON HCL 4 MG/2ML IJ SOLN
INTRAMUSCULAR | Status: AC
  Filled 2021-11-13: qty 2

## 2021-11-13 MED ORDER — MIDAZOLAM HCL 5 MG/5ML IJ SOLN
INTRAMUSCULAR | Status: DC | PRN
  Administered 2021-11-13: 2 mg via INTRAVENOUS

## 2021-11-13 MED ORDER — BUPIVACAINE LIPOSOME 1.3 % IJ SUSP
INTRAMUSCULAR | Status: DC | PRN
  Administered 2021-11-13: 20 mL

## 2021-11-13 MED ORDER — MENTHOL 3 MG MT LOZG
1.0000 | LOZENGE | OROMUCOSAL | Status: DC | PRN
  Administered 2021-11-13 – 2021-11-15 (×4): 3 mg via ORAL
  Filled 2021-11-13 (×4): qty 9

## 2021-11-13 MED ORDER — PROPOFOL 10 MG/ML IV BOLUS
INTRAVENOUS | Status: DC | PRN
  Administered 2021-11-13: 180 mg via INTRAVENOUS

## 2021-11-13 MED ORDER — FENTANYL CITRATE (PF) 250 MCG/5ML IJ SOLN
INTRAMUSCULAR | Status: AC
  Filled 2021-11-13: qty 5

## 2021-11-13 MED ORDER — MEPERIDINE HCL 50 MG/ML IJ SOLN: 6.2500 mg | INTRAMUSCULAR | Status: DC | PRN

## 2021-11-13 MED ORDER — BUPIVACAINE LIPOSOME 1.3 % IJ SUSP
INTRAMUSCULAR | Status: AC
  Filled 2021-11-13: qty 20

## 2021-11-13 MED ORDER — SUGAMMADEX SODIUM 500 MG/5ML IV SOLN
INTRAVENOUS | Status: DC | PRN
  Administered 2021-11-13: 400 mg via INTRAVENOUS

## 2021-11-13 MED ORDER — HEMOSTATIC AGENTS (NO CHARGE) OPTIME
TOPICAL | Status: DC | PRN
  Administered 2021-11-13: 1 via TOPICAL

## 2021-11-13 MED ORDER — ORAL CARE MOUTH RINSE: 15.0000 mL | Freq: Once | OROMUCOSAL | Status: DC

## 2021-11-13 MED ORDER — ROCURONIUM BROMIDE 10 MG/ML (PF) SYRINGE
PREFILLED_SYRINGE | INTRAVENOUS | Status: AC
  Filled 2021-11-13: qty 10

## 2021-11-13 MED ORDER — HYDROMORPHONE HCL 1 MG/ML IJ SOLN
INTRAMUSCULAR | Status: AC
  Filled 2021-11-13: qty 0.5

## 2021-11-13 MED ORDER — MIDAZOLAM HCL 2 MG/2ML IJ SOLN
INTRAMUSCULAR | Status: AC
  Filled 2021-11-13: qty 2

## 2021-11-13 MED ORDER — GUAIFENESIN-DM 100-10 MG/5ML PO SYRP
15.0000 mL | ORAL_SOLUTION | ORAL | Status: DC | PRN
  Administered 2021-11-13 – 2021-11-15 (×4): 15 mL via ORAL
  Filled 2021-11-13 (×4): qty 15

## 2021-11-13 MED ORDER — ONDANSETRON HCL 4 MG/2ML IJ SOLN
INTRAMUSCULAR | Status: DC | PRN
  Administered 2021-11-13: 4 mg via INTRAVENOUS

## 2021-11-13 MED ORDER — MECLIZINE HCL 12.5 MG PO TABS
12.5000 mg | ORAL_TABLET | Freq: Three times a day (TID) | ORAL | Status: DC | PRN
  Administered 2021-11-13: 12.5 mg via ORAL
  Filled 2021-11-13: qty 1

## 2021-11-13 MED ORDER — HYDROMORPHONE HCL 1 MG/ML IJ SOLN
0.2500 mg | INTRAMUSCULAR | Status: DC | PRN
  Administered 2021-11-13: 0.5 mg via INTRAVENOUS

## 2021-11-13 MED ORDER — HEMOSTATIC AGENTS (NO CHARGE) OPTIME
TOPICAL | Status: DC | PRN
  Administered 2021-11-13: 2 via TOPICAL

## 2021-11-13 MED ORDER — MUPIROCIN 2 % EX OINT
1.0000 | TOPICAL_OINTMENT | Freq: Two times a day (BID) | CUTANEOUS | Status: DC
  Administered 2021-11-13 – 2021-11-16 (×8): 1 via NASAL
  Filled 2021-11-13 (×4): qty 22

## 2021-11-13 MED ORDER — ROCURONIUM BROMIDE 100 MG/10ML IV SOLN
INTRAVENOUS | Status: DC | PRN
  Administered 2021-11-13: 10 mg via INTRAVENOUS
  Administered 2021-11-13: 60 mg via INTRAVENOUS

## 2021-11-13 MED ORDER — FENTANYL CITRATE (PF) 100 MCG/2ML IJ SOLN
INTRAMUSCULAR | Status: DC | PRN
  Administered 2021-11-13: 50 ug via INTRAVENOUS
  Administered 2021-11-13: 100 ug via INTRAVENOUS
  Administered 2021-11-13 (×2): 50 ug via INTRAVENOUS

## 2021-11-13 SURGICAL SUPPLY — 59 items
ADH SKN CLS APL DERMABOND .7 (GAUZE/BANDAGES/DRESSINGS) ×2
APL PRP STRL LF DISP 70% ISPRP (MISCELLANEOUS) ×2
APL SRG 38 LTWT LNG FL B (MISCELLANEOUS) ×2
APPLICATOR ARISTA FLEXITIP XL (MISCELLANEOUS) ×1 IMPLANT
APPLIER CLIP ROT 10 11.4 M/L (STAPLE) ×2
APR CLP MED LRG 11.4X10 (STAPLE) ×2
BAG DECANTER FOR FLEXI CONT (MISCELLANEOUS) ×3 IMPLANT
BLADE SURG 15 STRL LF DISP TIS (BLADE) ×3 IMPLANT
BLADE SURG 15 STRL SS (BLADE) ×2
CATH CHOLANGIOGRAM 4.5FR (CATHETERS) IMPLANT
CHLORAPREP W/TINT 26 (MISCELLANEOUS) ×3 IMPLANT
CLIP APPLIE ROT 10 11.4 M/L (STAPLE) ×3 IMPLANT
CLOTH BEACON ORANGE TIMEOUT ST (SAFETY) ×3 IMPLANT
COVER LIGHT HANDLE STERIS (MISCELLANEOUS) ×6 IMPLANT
CUTTER FLEX LINEAR 45M (STAPLE) ×1 IMPLANT
DECANTER SPIKE VIAL GLASS SM (MISCELLANEOUS) ×6 IMPLANT
DERMABOND ADVANCED .7 DNX12 (GAUZE/BANDAGES/DRESSINGS) ×3 IMPLANT
DRAPE C-ARM FOLDED MOBILE STRL (DRAPES) ×3 IMPLANT
ELECT REM PT RETURN 9FT ADLT (ELECTROSURGICAL) ×2
ELECTRODE REM PT RTRN 9FT ADLT (ELECTROSURGICAL) ×3 IMPLANT
EVACUATOR DRAINAGE 10X20 100CC (DRAIN) ×1 IMPLANT
EVACUATOR SILICONE 100CC (DRAIN) ×2
GLOVE BIO SURGEON STRL SZ 6.5 (GLOVE) ×3 IMPLANT
GLOVE BIO SURGEON STRL SZ7 (GLOVE) ×1 IMPLANT
GLOVE BIOGEL PI IND STRL 6.5 (GLOVE) ×3 IMPLANT
GLOVE BIOGEL PI IND STRL 7.0 (GLOVE) ×6 IMPLANT
GLOVE BIOGEL PI IND STRL 7.5 (GLOVE) ×1 IMPLANT
GOWN STRL REUS W/TWL LRG LVL3 (GOWN DISPOSABLE) ×9 IMPLANT
HEMOSTAT ARISTA ABSORB 3G PWDR (HEMOSTASIS) ×1 IMPLANT
HEMOSTAT SNOW SURGICEL 2X4 (HEMOSTASIS) ×4 IMPLANT
INST SET LAPROSCOPIC AP (KITS) ×3 IMPLANT
IV NS 500ML (IV SOLUTION) ×2
IV NS 500ML BAXH (IV SOLUTION) ×3 IMPLANT
KIT TURNOVER KIT A (KITS) ×3 IMPLANT
MANIFOLD NEPTUNE II (INSTRUMENTS) ×3 IMPLANT
NEEDLE INSUFFLATION 120MM (ENDOMECHANICALS) ×3 IMPLANT
NS IRRIG 1000ML POUR BTL (IV SOLUTION) ×3 IMPLANT
PACK LAP CHOLE LZT030E (CUSTOM PROCEDURE TRAY) ×3 IMPLANT
PAD ARMBOARD 7.5X6 YLW CONV (MISCELLANEOUS) ×3 IMPLANT
RELOAD 45 VASCULAR/THIN (ENDOMECHANICALS) ×2 IMPLANT
RELOAD STAPLE 45 2.5 WHT GRN (ENDOMECHANICALS) IMPLANT
SET BASIN LINEN APH (SET/KITS/TRAYS/PACK) ×3 IMPLANT
SET TUBE IRRIG SUCTION NO TIP (IRRIGATION / IRRIGATOR) IMPLANT
SET TUBE SMOKE EVAC HIGH FLOW (TUBING) ×3 IMPLANT
SLEEVE Z-THREAD 5X100MM (TROCAR) ×3 IMPLANT
STAPLER VISISTAT (STAPLE) ×1 IMPLANT
SUT ETHILON 3 0 PS 1 18 (SUTURE) ×1 IMPLANT
SUT MNCRL AB 4-0 PS2 18 (SUTURE) ×6 IMPLANT
SUT VICRYL 0 UR6 27IN ABS (SUTURE) ×3 IMPLANT
SYR 20ML LL LF (SYRINGE) ×3 IMPLANT
SYR 30ML LL (SYRINGE) ×3 IMPLANT
SYR CONTROL 10ML LL (SYRINGE) ×3 IMPLANT
SYS BAG RETRIEVAL 10MM (BASKET) ×2
SYSTEM BAG RETRIEVAL 10MM (BASKET) ×3 IMPLANT
TROCAR Z-THRD FIOS HNDL 11X100 (TROCAR) ×3 IMPLANT
TROCAR Z-THREAD FIOS 5X100MM (TROCAR) ×3 IMPLANT
TROCAR Z-THREAD SLEEVE 11X100 (TROCAR) ×3 IMPLANT
TUBE CONNECTING 12X1/4 (SUCTIONS) ×3 IMPLANT
WARMER LAPAROSCOPE (MISCELLANEOUS) ×3 IMPLANT

## 2021-11-13 NOTE — Progress Notes (Signed)
[facility]   Updated daughter and team. Some degree of acute on chronic cholecystitis with inflammation and fibrinous tissue.   Very short cystic duct, was unable to do the cholangiogram as infundibulum fused into the cystic duct. JP in place given use of staple load on the infundibulum/cystic duct junction.   Diet now. PRN for pain JP drain. Hopefully home in next 24-48 hours Zosyn for 24 more hours  Curlene Labrum, MD [facility] [address] [place], [place] [address] [phone] (office)

## 2021-11-13 NOTE — Interval H&P Note (Signed)
History and Physical Interval Note:  11/13/2021 10:02 AM  [name]  has presented today for surgery, with the diagnosis of gallstones, possible choledocoliathiasis, transaminitis.  The various methods of treatment have been discussed with the patient and family. After consideration of risks, benefits and other options for treatment, the patient has consented to  Procedure(s): LAPAROSCOPIC CHOLECYSTECTOMY WITH INTRAOPERATIVE CHOLANGIOGRAM (N/A) as a surgical intervention.  The patient's history has been reviewed, patient examined, no change in status, stable for surgery.  I have reviewed the patient's chart and labs.  Questions were answered to the patient's satisfaction.   LFTs coming down, if can get IOC will try.   Virl Cagey

## 2021-11-13 NOTE — Anesthesia Postprocedure Evaluation (Signed)
Anesthesia Post Note  Patient: [name]  Procedure(s) Performed: LAPAROSCOPIC CHOLECYSTECTOMY (Abdomen)  Patient location during evaluation: Phase II Anesthesia Type: General Level of consciousness: awake and alert and oriented Pain management: pain level controlled Vital Signs Assessment: post-procedure vital signs reviewed and stable Respiratory status: spontaneous breathing, nonlabored ventilation and respiratory function stable Cardiovascular status: blood pressure returned to baseline and stable Postop Assessment: no apparent nausea or vomiting Anesthetic complications: no   No notable events documented.   Last Vitals:  Vitals:   11/13/21 1430 11/13/21 1454  BP: (!) 147/67 (!) 171/66  Pulse: 61 (!) 59  Resp:  20  Temp:  36.8 C  SpO2: 100% 96%    Last Pain:  Vitals:   11/13/21 1454  TempSrc: Oral  PainSc:                  Yago Ludvigsen C Tema Alire

## 2021-11-13 NOTE — Transfer of Care (Signed)
Immediate Anesthesia Transfer of Care Note  Patient: [name]  Procedure(s) Performed: LAPAROSCOPIC CHOLECYSTECTOMY (Abdomen)  Patient Location: PACU  Anesthesia Type:General  Level of Consciousness: awake, alert , oriented and patient cooperative  Airway & Oxygen Therapy: Patient Spontanous Breathing and Patient connected to face mask oxygen  Post-op Assessment: Report given to RN, Post -op Vital signs reviewed and stable and Patient moving all extremities X 4  Post vital signs: Reviewed and stable  Last Vitals:  Vitals Value Taken Time  BP 169/84   Temp 98.8   Pulse 61 11/13/21 1349  Resp    SpO2 99 % 11/13/21 1349  Vitals shown include unvalidated device data.  Last Pain:  Vitals:   11/13/21 0934  TempSrc:   PainSc: 0-No pain      Patients Stated Pain Goal: 0 (21/03/12 8118)  Complications: No notable events documented.

## 2021-11-13 NOTE — Anesthesia Procedure Notes (Signed)
Procedure Name: Intubation Date/Time: 11/13/2021 12:19 PM  Performed by: Jonna Munro, CRNAPre-anesthesia Checklist: Patient identified, Emergency Drugs available, Suction available and Patient being monitored Patient Re-evaluated:Patient Re-evaluated prior to induction Oxygen Delivery Method: Circle system utilized Preoxygenation: Pre-oxygenation with 100% oxygen Induction Type: IV induction Ventilation: Mask ventilation without difficulty Laryngoscope Size: Mac and 3 Grade View: Grade I Tube type: Oral Tube size: 7.0 mm Number of attempts: 1 Airway Equipment and Method: Stylet Placement Confirmation: positive ETCO2, ETT inserted through vocal cords under direct vision and breath sounds checked- equal and bilateral Secured at: 21 cm Tube secured with: Tape Dental Injury: Teeth and Oropharynx as per pre-operative assessment  Comments: Good airway, cords clear, AOI

## 2021-11-13 NOTE — Op Note (Signed)
Operative Note   Preoperative Diagnosis: Symptomatic cholelithiasis, elevated liver test   Postoperative Diagnosis: Acute cholecystitis, elevated liver test    Procedure(s) Performed: Laparoscopic cholecystectomy   Surgeon: Lanell Matar. Constance Haw, MD   Assistants: No qualified resident was available   Anesthesia: General endotracheal   Anesthesiologist: Denese Killings, MD    Specimens: Gallbladder    Estimated Blood Loss: Minimal    Blood Replacement: None    Complications: None    Operative Findings:  Contracted gallbladder with edema and fibrinous tissue   Procedure: The patient was taken to the operating room and placed supine. General endotracheal anesthesia was induced. Intravenous antibiotics were administered per protocol. An orogastric tube positioned to decompress the stomach. The abdomen was prepared and draped in the usual sterile fashion.    A supraumbilical incision was made and a Veress technique was utilized to achieve pneumoperitoneum to 15 mmHg with carbon dioxide. A 11 mm optiview port was placed through the supraumbilical region, and a 10 mm 0-degree operative laparoscope was introduced. The area underlying the trocar and Veress needle were inspected and without evidence of injury.  Remaining trocars were placed under direct vision. Two 5 mm ports were placed in the right abdomen, between the anterior axillary and midclavicular line.  A final 11 mm port was placed through the mid-epigastrium, near the falciform ligament.    The gallbladder fundus was elevated cephalad and the infundibulum was retracted to the patient's right. The gallbladder/cystic duct junction was skeletonized. The cystic artery noted in the triangle of Calot and was also skeletonized.  We then continued liberal medial and lateral dissection until the critical view of safety was achieved.    The cystic artery was doubly clipped and divided. The cystic duct was very short and thick with the  infundibulum basically forming into the cystic duct.  Given this I decided I would not be able to do the cholangiogram as the clamp nor clip would cover the gallbladder to allow for contrast to go down the CBD.  Given the shortness and thickness I opted to take the gallbladder down from the liver bed with electrocautery to get it on a pedicle. From here I took the cystic duct/infundibulum with a vascular staple load. The specimen was placed in an Endopouch and was retrieved through the epigastric site.   Final inspection revealed acceptable hemostasis. Surgicel Powder and Surgical SNOW was placed in the gallbladder bed.  A JP drain was placed and secured with a 3-0 Nylon in the gallbladder fossa to monitor for any leakage given the staple line. Trocars were removed and pneumoperitoneum was released.  Skin incisions were closed with staples. Gauze and dressings were placed. The patient was awakened from anesthesia and extubated without complication.    Curlene Labrum, MD [facility] [address] [place], [place] [address] [phone] (office)

## 2021-11-13 NOTE — Progress Notes (Signed)
Patient has been NP since 0015 this morning. Patient has a swallow of water to take ordered Potassium pills, Patient received a bed bath and pericare twice this shift once by family and once by this nurse and CNA. No further  complaints. Continued to monitor patient.

## 2021-11-13 NOTE — Progress Notes (Signed)
PROGRESS NOTE    MARCIEL OFFENBERGER  XBW:[id number] DOB: [birth year] DOA: 11/09/2021 PCP: Coral Spikes, DO   Brief Narrative:   66 y/o female with history of DM2, HTN, OSA, Obesity class 3, admitted to the hospital with abdominal pain and elevated LFTs. Noted to have cholelithiasis and similar presentation earlier this year. LFTs trending down with IV fluids. Concern that patient has been passing stones. Currently no evidence of choledocholithiasis on imaging (CT, [place]). GI following. Gen surgery following and plans on cholecystectomy 11/1.  Assessment & Plan:   Principal Problem:   Gallstones Active Problems:   Hypothyroidism   Obstructive sleep apnea   Hypertension   Type 2 diabetes mellitus with hyperlipidemia (HCC)   Transaminitis   Knee pain   SIRS (systemic inflammatory response syndrome) (HCC)  Assessment and Plan:   Transaminitis-downtrending Cholelithiasis -Concern for recurrent choledocholithiasis -CT imaging and ultrasound imaging did not reveal any stones and bile duct or bile duct dilatation -She is noted to have cholelithiasis -Overall LFTs are trending down since admission -AST 1494->237 -ALT 911->352 -ALP 227->142 -Bili 2.7->1.0 -GI following, appreciate input -Continue IV fluids and recheck labs in a.m. -Gen surgery following and plans on cholecystectomy 11/1 -Pre-op echo without any significant findings, EF preserved.      SIRS -Noted to be febrile, tachycardic and tachypneic -Started on broad-spectrum antibiotics for intra abdominal process -Overall hemodynamics are improving   Type 2 diabetes -Continued on 70/30 insulin -Continue sliding scale -Blood sugar stable   Atrial flutter -Continue on metoprolol -She is on chronic anticoagulation with Eliquis. Last dose of Eliquis was a.m. of 10/29   Hypertension -Blood pressures currently stable -Holding amlodipine and benazepril until after surgery -Continue metoprolol for now   Bilateral  osteoarthritis of knees -Severely debilitating, mostly wheelchair-bound now -Wishes to pursue operative management, but will need to lose weight prior to being considered for knee replacements   Obstructive sleep apnea -Continue CPAP   Hypothyroidism -Continue Synthroid   Obesity, class III -BMI 50.3 -Discussed the importance of healthy eating habits and physical activity     DVT prophylaxis: SCD's Start: 11/12/21 1340 SCDs Start: 11/10/21 0024   Code Status: full code Family Communication: discussed with patient Disposition Plan: Status is: Inpatient Remains inpatient appropriate because: awaiting surgical management of cholelithiasis     Consultants:  GI Gen surgery   Procedures:      Antimicrobials:  Zosyn 10/29>      Subjective: Patient seen and evaluated today with no new acute complaints or concerns. No acute concerns or events noted overnight. She denies any abdominal pain or N/V at this time.  Objective: Vitals:   11/12/21 1318 11/12/21 2319 11/13/21 0517 11/13/21 0934  BP: 107/79 (!) 158/76 (!) 153/68 (!) 169/74  Pulse: 66 70 77 69  Resp:  '16 16 17  '$ Temp: 98.6 F (37 C) 98 F (36.7 C) 99.2 F (37.3 C) 98.7 F (37.1 C)  TempSrc: Oral Oral Oral   SpO2: 98% 99% 96% 95%  Weight:      Height:        Intake/Output Summary (Last 24 hours) at 11/13/2021 1136 Last data filed at 11/13/2021 0810 Gross per 24 hour  Intake 360 ml  Output 4750 ml  Net -4390 ml   Filed Weights   11/09/21 2011  Weight: 135.2 kg    Examination:  General exam: Appears calm and comfortable, morbidly obese Respiratory system: Clear to auscultation. Respiratory effort normal. Cardiovascular system: S1 & S2 heard, RRR.  Gastrointestinal  system: Abdomen is soft Central nervous system: Alert and awake Extremities: No edema Skin: No significant lesions noted Psychiatry: Flat affect.    Data Reviewed: I have personally reviewed following labs and imaging  studies  CBC: Recent Labs  Lab 11/06/21 1227 11/09/21 2035 11/10/21 0428 11/12/21 0328 11/13/21 0431  WBC 5.5 8.2 5.7 4.7 4.9  NEUTROABS  --  6.8 3.9  --   --   HGB 12.3 13.7 10.9* 9.7* 11.7*  HCT 38.7 41.0 33.0* 30.7* 35.6*  MCV 87 82.8 85.1 88.0 86.0  PLT 215 255 175 150 497   Basic Metabolic Panel: Recent Labs  Lab 11/06/21 1227 11/09/21 2039 11/10/21 0428 11/12/21 0328 11/13/21 0431  NA 142 138 139 141 140  K 4.4 3.8 3.2* 3.3* 4.0  CL 101 98 107 109 106  CO2 '25 28 25 25 26  '$ GLUCOSE 144* 219* 162* 95 121*  BUN '10 13 11 '$ 7* 6*  CREATININE 0.89 0.98 0.86 1.01* 1.08*  CALCIUM 9.2 9.5 7.8* 7.7* 8.6*  MG  --   --  1.9  --   --    GFR: Estimated Creatinine Clearance: 69.9 mL/min (A) (by C-G formula based on SCr of 1.08 mg/dL (H)). Liver Function Tests: Recent Labs  Lab 11/09/21 2039 11/10/21 0428 11/11/21 0425 11/12/21 0328 11/13/21 0431  AST 1,494* 769* 476* 237* 145*  ALT 911* 602* 565* 352* 291*  ALKPHOS 227* 162* 166* 142* 167*  BILITOT 2.7* 2.4* 2.2* 1.0 1.1  PROT 8.7* 6.1* 6.6 5.9* 6.8  ALBUMIN 4.1 3.0* 3.1* 2.8* 3.2*   Recent Labs  Lab 11/09/21 2036  LIPASE 43   No results for input(s): "AMMONIA" in the last 168 hours. Coagulation Profile: No results for input(s): "INR", "PROTIME" in the last 168 hours. Cardiac Enzymes: No results for input(s): "CKTOTAL", "CKMB", "CKMBINDEX", "TROPONINI" in the last 168 hours. BNP (last 3 results) No results for input(s): "PROBNP" in the last 8760 hours. HbA1C: No results for input(s): "HGBA1C" in the last 72 hours. CBG: Recent Labs  Lab 11/12/21 1137 11/12/21 1721 11/12/21 2149 11/12/21 2337 11/13/21 0521  GLUCAP 141* 82 139* 132* 151*   Lipid Profile: No results for input(s): "CHOL", "HDL", "LDLCALC", "TRIG", "CHOLHDL", "LDLDIRECT" in the last 72 hours. Thyroid Function Tests: No results for input(s): "TSH", "T4TOTAL", "FREET4", "T3FREE", "THYROIDAB" in the last 72 hours. Anemia Panel: No results  for input(s): "VITAMINB12", "FOLATE", "FERRITIN", "TIBC", "IRON", "RETICCTPCT" in the last 72 hours. Sepsis Labs: No results for input(s): "PROCALCITON", "LATICACIDVEN" in the last 168 hours.  Recent Results (from the past 240 hour(s))  Surgical PCR screen     Status: None   Collection Time: 11/13/21  1:23 AM   Specimen: Nasal Mucosa; Nasal Swab  Result Value Ref Range Status   MRSA, PCR NEGATIVE NEGATIVE Final   Staphylococcus aureus NEGATIVE NEGATIVE Final    Comment: (NOTE) The Xpert SA Assay (FDA approved for NASAL specimens in patients 22 years of age and older), is one component of a comprehensive surveillance program. It is not intended to diagnose infection nor to guide or monitor treatment. Performed at [facility], [address], [place], [place] [address]          Radiology Studies: ECHOCARDIOGRAM COMPLETE  Result Date: 11/12/2021    ECHOCARDIOGRAM REPORT   Patient Name:   NATHANIEL YADEN Date of Exam: 11/12/2021 Medical Rec #:  [id number]        Height:       64.5 in Accession #:  [id number]       Weight:       298.0 lb Date of Birth:  [birth year]        BSA:          2.330 m Patient Age:    18 years         BP:           138/68 mmHg Patient Gender: F                HR:           67 bpm. Exam Location:  [facility] Procedure: 2D Echo, Cardiac Doppler, Color Doppler and Intracardiac            Opacification Agent Indications:    Pre-operative cardiovascular examination  History:        Patient has prior history of Echocardiogram examinations, most                 recent 02/15/2015. CHF, Arrythmias:Atrial Flutter; Risk                 Factors:Hypertension, Diabetes and Former Smoker. Breast CA.  Sonographer:    Wenda Low Referring Phys: [phone] [facility]  Sonographer Comments: Technically difficult study due to poor echo windows and patient is obese. IMPRESSIONS  1. Left ventricular ejection fraction, by estimation, is 65 to 70%. The left ventricle has normal function.  The left ventricle has no regional wall motion abnormalities. There is moderate concentric left ventricular hypertrophy. Left ventricular diastolic parameters were normal.  2. Right ventricular systolic function is normal. The right ventricular size is normal. Tricuspid regurgitation signal is inadequate for assessing PA pressure.  3. The mitral valve is grossly normal, mildly calcified. Trivial mitral valve regurgitation.  4. The aortic valve was not well visualized. There is mild calcification of the aortic valve. Aortic valve regurgitation is not visualized. Aortic valve sclerosis is present, with no evidence of aortic valve stenosis. Aortic valve mean gradient measures  3.0 mmHg.  5. The inferior vena cava is normal in size with greater than 50% respiratory variability, suggesting right atrial pressure of 3 mmHg. Comparison(s): Prior images unable to be directly viewed. FINDINGS  Left Ventricle: Left ventricular ejection fraction, by estimation, is 65 to 70%. The left ventricle has normal function. The left ventricle has no regional wall motion abnormalities. Definity contrast agent was given IV to delineate the left ventricular  endocardial borders. The left ventricular internal cavity size was normal in size. There is moderate concentric left ventricular hypertrophy. Left ventricular diastolic parameters were normal. Right Ventricle: The right ventricular size is normal. No increase in right ventricular wall thickness. Right ventricular systolic function is normal. Tricuspid regurgitation signal is inadequate for assessing PA pressure. Left Atrium: Left atrial size was normal in size. Right Atrium: Right atrial size was normal in size. Pericardium: There is no evidence of pericardial effusion. Mitral Valve: The mitral valve is grossly normal. There is mild calcification of the mitral valve leaflet(s). Trivial mitral valve regurgitation. MV peak gradient, 3.1 mmHg. The mean mitral valve gradient is 1.0 mmHg.  Tricuspid Valve: The tricuspid valve is grossly normal. Tricuspid valve regurgitation is trivial. Aortic Valve: The aortic valve was not well visualized. There is mild calcification of the aortic valve. Aortic valve regurgitation is not visualized. Aortic valve sclerosis is present, with no evidence of aortic valve stenosis. Aortic valve mean gradient measures 3.0 mmHg. Aortic valve peak gradient measures 6.6 mmHg. Aortic valve area, by VTI measures  3.13 cm. Pulmonic Valve: The pulmonic valve was not well visualized. Pulmonic valve regurgitation is trivial. Aorta: The aortic root is normal in size and structure. Venous: The inferior vena cava is normal in size with greater than 50% respiratory variability, suggesting right atrial pressure of 3 mmHg. IAS/Shunts: No atrial level shunt detected by color flow Doppler.  LEFT VENTRICLE PLAX 2D LVIDd:         4.80 cm   Diastology LVIDs:         3.00 cm   LV e' medial:    9.36 cm/s LV PW:         1.30 cm   LV E/e' medial:  7.3 LV IVS:        1.30 cm   LV e' lateral:   8.27 cm/s LVOT diam:     2.00 cm   LV E/e' lateral: 8.2 LV SV:         84 LV SV Index:   36 LVOT Area:     3.14 cm  LEFT ATRIUM           Index LA diam:      3.90 cm 1.67 cm/m LA Vol (A4C): 50.4 ml 21.63 ml/m  AORTIC VALVE AV Area (Vmax):    2.85 cm AV Area (Vmean):   2.80 cm AV Area (VTI):     3.13 cm AV Vmax:           128.00 cm/s AV Vmean:          84.600 cm/s AV VTI:            0.267 m AV Peak Grad:      6.6 mmHg AV Mean Grad:      3.0 mmHg LVOT Vmax:         116.00 cm/s LVOT Vmean:        75.300 cm/s LVOT VTI:          0.266 m LVOT/AV VTI ratio: 1.00  AORTA Ao Root diam: 3.10 cm MITRAL VALVE MV Area (PHT): 3.17 cm    SHUNTS MV Area VTI:   2.99 cm    Systemic VTI:  0.27 m MV Peak grad:  3.1 mmHg    Systemic Diam: 2.00 cm MV Mean grad:  1.0 mmHg MV Vmax:       0.88 m/s MV Vmean:      51.9 cm/s MV Decel Time: 239 msec MV E velocity: 68.10 cm/s MV A velocity: 87.40 cm/s MV E/A ratio:  0.78 Rozann Lesches MD Electronically signed by Rozann Lesches MD Signature Date/Time: 11/12/2021/10:42:41 AM    Final         Scheduled Meds:  chlorhexidine  15 mL Mouth/Throat Once   Or   mouth rinse  15 mL Mouth Rinse Once   [MAR Hold] insulin aspart  0-15 Units Subcutaneous Q6H   [MAR Hold] insulin aspart  0-5 Units Subcutaneous QHS   [MAR Hold] levothyroxine  100 mcg Oral Q0600   [MAR Hold] metoprolol succinate  100 mg Oral Daily   [MAR Hold] mupirocin ointment  1 Application Nasal BID   [MAR Hold] pantoprazole  40 mg Oral BID AC   [MAR Hold] pregabalin  100 mg Oral TID   Continuous Infusions:  sodium chloride 75 mL/hr at 11/12/21 0559   cefoTEtan (CEFOTAN) IV     lactated ringers     [MAR Hold] piperacillin-tazobactam 3.375 g (11/13/21 0429)     LOS: 4 days    Time spent: 35 minutes  Shakenna Herrero Darleen Crocker, DO [facility]  If 7PM-7AM, please contact night-coverage [email or web address] 11/13/2021, 11:36 AM

## 2021-11-14 DIAGNOSIS — K8020 Calculus of gallbladder without cholecystitis without obstruction: Secondary | ICD-10-CM | POA: Diagnosis not present

## 2021-11-14 LAB — COMPREHENSIVE METABOLIC PANEL
ALT: 254 U/L — ABNORMAL HIGH (ref 0–44)
AST: 134 U/L — ABNORMAL HIGH (ref 15–41)
Albumin: 3.2 g/dL — ABNORMAL LOW (ref 3.5–5.0)
Alkaline Phosphatase: 166 U/L — ABNORMAL HIGH (ref 38–126)
Anion gap: 13 (ref 5–15)
BUN: 10 mg/dL (ref 8–23)
CO2: 22 mmol/L (ref 22–32)
Calcium: 8.5 mg/dL — ABNORMAL LOW (ref 8.9–10.3)
Chloride: 103 mmol/L (ref 98–111)
Creatinine, Ser: 1.26 mg/dL — ABNORMAL HIGH (ref 0.44–1.00)
GFR, Estimated: 47 mL/min — ABNORMAL LOW (ref >60)
Glucose, Bld: 167 mg/dL — ABNORMAL HIGH (ref 70–99)
Potassium: 4.1 mmol/L (ref 3.5–5.1)
Sodium: 138 mmol/L (ref 135–145)
Total Bilirubin: 0.8 mg/dL (ref 0.3–1.2)
Total Protein: 7.1 g/dL (ref 6.5–8.1)

## 2021-11-14 LAB — MAGNESIUM: Magnesium: 2.1 mg/dL (ref 1.7–2.4)

## 2021-11-14 LAB — CBC
HCT: 35.8 % — ABNORMAL LOW (ref 36.0–46.0)
Hemoglobin: 12.1 g/dL (ref 12.0–15.0)
MCH: 28.9 pg (ref 26.0–34.0)
MCHC: 33.8 g/dL (ref 30.0–36.0)
MCV: 85.4 fL (ref 80.0–100.0)
Platelets: 153 10*3/uL (ref 150–400)
RBC: 4.19 MIL/uL (ref 3.87–5.11)
RDW: 16.8 % — ABNORMAL HIGH (ref 11.5–15.5)
WBC: 8.3 10*3/uL (ref 4.0–10.5)
nRBC: 0 % (ref 0.0–0.2)

## 2021-11-14 LAB — GLUCOSE, CAPILLARY
Glucose-Capillary: 180 mg/dL — ABNORMAL HIGH (ref 70–99)
Glucose-Capillary: 189 mg/dL — ABNORMAL HIGH (ref 70–99)
Glucose-Capillary: 203 mg/dL — ABNORMAL HIGH (ref 70–99)
Glucose-Capillary: 223 mg/dL — ABNORMAL HIGH (ref 70–99)

## 2021-11-14 MED ORDER — AMLODIPINE BESYLATE 5 MG PO TABS
10.0000 mg | ORAL_TABLET | Freq: Every day | ORAL | Status: DC
  Administered 2021-11-14 – 2021-11-16 (×3): 10 mg via ORAL
  Filled 2021-11-14 (×3): qty 2

## 2021-11-14 MED ORDER — LIDOCAINE HCL (PF) 2 % IJ SOLN
INTRAMUSCULAR | Status: AC
  Filled 2021-11-14: qty 5

## 2021-11-14 NOTE — Progress Notes (Signed)
PROGRESS NOTE    [name]  [id number]:[id number] DOB: July 25, 1955 DOA: 11/09/2021 PCP: Coral Spikes, DO   Brief Narrative:    66 y/o female with history of DM2, HTN, OSA, Obesity class 3, admitted to the hospital with abdominal pain and elevated LFTs. Noted to have cholelithiasis and similar presentation earlier this year. LFTs trending down with IV fluids. Concern that patient has been passing stones. Currently no evidence of choledocholithiasis on imaging (CT, [place]).  She has undergone laparoscopic cholecystectomy 11/1.  Assessment & Plan:   Principal Problem:   Gallstones Active Problems:   Hypothyroidism   Obstructive sleep apnea   Hypertension   Type 2 diabetes mellitus with hyperlipidemia (HCC)   Transaminitis   Knee pain   SIRS (systemic inflammatory response syndrome) (HCC)  Assessment and Plan:  Transaminitis-downtrending Cholelithiasis -Concern for recurrent choledocholithiasis -CT imaging and ultrasound imaging did not reveal any stones and bile duct or bile duct dilatation -She is noted to have cholelithiasis -Overall LFTs are trending down since admission -Continue IV fluids and recheck labs in a.m. -Status postcholecystectomy 11/1, advance diet per general surgery     Mild AKI -Creatinine 1.26 with baseline near 0.8 -Continue IV fluid and monitor strict I's and O's -Recheck a.m. labs -Avoid nephrotoxic agents   Type 2 diabetes -Continued on 70/30 insulin -Continue sliding scale -Blood sugar stable   Atrial flutter -Continue on metoprolol -She is on chronic anticoagulation with Eliquis. Last dose of Eliquis was a.m. of 10/29   Hypertension -Blood pressures currently stable -Okay to resume amlodipine and hold benazepril -Continue metoprolol for now   Bilateral osteoarthritis of knees -Severely debilitating, mostly wheelchair-bound now -Wishes to pursue operative management, but will need to lose weight prior to being considered for knee  replacements   Obstructive sleep apnea -Continue CPAP   Hypothyroidism -Continue Synthroid   Obesity, class III -BMI 50.3 -Discussed the importance of healthy eating habits and physical activity     DVT prophylaxis: SCD's Start: 11/12/21 1340 SCDs Start: 11/10/21 0024   Code Status: full code Family Communication: discussed with patient Disposition Plan: Status is: Inpatient Remains inpatient appropriate because: awaiting surgical management of cholelithiasis     Consultants:  GI Gen surgery   Procedures:      Antimicrobials:  Zosyn 10/29>    Subjective: Patient seen and evaluated today with ongoing mild cough and sore throat.  She appears to be feeling better in regards to her abdominal pain.  Objective: Vitals:   11/13/21 1806 11/13/21 2020 11/14/21 0457 11/14/21 0849  BP: (!) 191/90 (!) 172/96 (!) 149/70 139/67  Pulse: 80 95 76 70  Resp: '19 16 18 17  '$ Temp: 99.2 F (37.3 C) 99 F (37.2 C) 98 F (36.7 C) 98.9 F (37.2 C)  TempSrc: Oral Oral  Oral  SpO2: 99% 93% 95% 100%  Weight:      Height:        Intake/Output Summary (Last 24 hours) at 11/14/2021 1117 Last data filed at 11/14/2021 0843 Gross per 24 hour  Intake 2760.65 ml  Output 1430 ml  Net 1330.65 ml   Filed Weights   11/09/21 2011  Weight: 135.2 kg    Examination:  General exam: Appears calm and comfortable, morbidly obese Respiratory system: Clear to auscultation. Respiratory effort normal. Cardiovascular system: S1 & S2 heard, RRR.  Gastrointestinal system: Abdomen is soft, incisions C/D/I Central nervous system: Alert and awake Extremities: No edema Skin: No significant lesions noted Psychiatry: Flat affect.    Data  Reviewed: I have personally reviewed following labs and imaging studies  CBC: Recent Labs  Lab 11/09/21 2035 11/10/21 0428 11/12/21 0328 11/13/21 0431 11/14/21 0358  WBC 8.2 5.7 4.7 4.9 8.3  NEUTROABS 6.8 3.9  --   --   --   HGB 13.7 10.9* 9.7* 11.7* 12.1   HCT 41.0 33.0* 30.7* 35.6* 35.8*  MCV 82.8 85.1 88.0 86.0 85.4  PLT 255 175 150 177 160   Basic Metabolic Panel: Recent Labs  Lab 11/09/21 2039 11/10/21 0428 11/12/21 0328 11/13/21 0431 11/14/21 0358  NA 138 139 141 140 138  K 3.8 3.2* 3.3* 4.0 4.1  CL 98 107 109 106 103  CO2 '28 25 25 26 22  '$ GLUCOSE 219* 162* 95 121* 167*  BUN 13 11 7* 6* 10  CREATININE 0.98 0.86 1.01* 1.08* 1.26*  CALCIUM 9.5 7.8* 7.7* 8.6* 8.5*  MG  --  1.9  --   --  2.1   GFR: Estimated Creatinine Clearance: 59.9 mL/min (A) (by C-G formula based on SCr of 1.26 mg/dL (H)). Liver Function Tests: Recent Labs  Lab 11/10/21 0428 11/11/21 0425 11/12/21 0328 11/13/21 0431 11/14/21 0358  AST 769* 476* 237* 145* 134*  ALT 602* 565* 352* 291* 254*  ALKPHOS 162* 166* 142* 167* 166*  BILITOT 2.4* 2.2* 1.0 1.1 0.8  PROT 6.1* 6.6 5.9* 6.8 7.1  ALBUMIN 3.0* 3.1* 2.8* 3.2* 3.2*   Recent Labs  Lab 11/09/21 2036  LIPASE 43   No results for input(s): "AMMONIA" in the last 168 hours. Coagulation Profile: No results for input(s): "INR", "PROTIME" in the last 168 hours. Cardiac Enzymes: No results for input(s): "CKTOTAL", "CKMB", "CKMBINDEX", "TROPONINI" in the last 168 hours. BNP (last 3 results) No results for input(s): "PROBNP" in the last 8760 hours. HbA1C: No results for input(s): "HGBA1C" in the last 72 hours. CBG: Recent Labs  Lab 11/13/21 1206 11/13/21 1558 11/13/21 2018 11/14/21 0029 11/14/21 0459  GLUCAP 104* 151* 180* 223* 203*   Lipid Profile: No results for input(s): "CHOL", "HDL", "LDLCALC", "TRIG", "CHOLHDL", "LDLDIRECT" in the last 72 hours. Thyroid Function Tests: No results for input(s): "TSH", "T4TOTAL", "FREET4", "T3FREE", "THYROIDAB" in the last 72 hours. Anemia Panel: No results for input(s): "VITAMINB12", "FOLATE", "FERRITIN", "TIBC", "IRON", "RETICCTPCT" in the last 72 hours. Sepsis Labs: No results for input(s): "PROCALCITON", "LATICACIDVEN" in the last 168  hours.  Recent Results (from the past 240 hour(s))  Surgical PCR screen     Status: None   Collection Time: 11/13/21  1:23 AM   Specimen: Nasal Mucosa; Nasal Swab  Result Value Ref Range Status   MRSA, PCR NEGATIVE NEGATIVE Final   Staphylococcus aureus NEGATIVE NEGATIVE Final    Comment: (NOTE) The Xpert SA Assay (FDA approved for NASAL specimens in patients 84 years of age and older), is one component of a comprehensive surveillance program. It is not intended to diagnose infection nor to guide or monitor treatment. Performed at [facility], [address] [place], [place] [address]          Radiology Studies: No results found.      Scheduled Meds:  insulin aspart  0-15 Units Subcutaneous Q6H   insulin aspart  0-5 Units Subcutaneous QHS   levothyroxine  100 mcg Oral Q0600   metoprolol succinate  100 mg Oral Daily   mupirocin ointment  1 Application Nasal BID   pantoprazole  40 mg Oral BID AC   pregabalin  100 mg Oral TID   Continuous Infusions:  sodium  chloride 75 mL/hr at 11/12/21 0559   piperacillin-tazobactam 3.375 g (11/14/21 0458)     LOS: 5 days    Time spent: 35 minutes    Sondos Wolfman D Manuella Ghazi, DO [facility]  If 7PM-7AM, please contact night-coverage [email or web address] 11/14/2021, 11:17 AM

## 2021-11-14 NOTE — Progress Notes (Signed)
Patient slept on and off this shift. Pain medication given per patient request. Patient given Cepacol to help with sore throat, she was also given Robitussin at 2007 to help with a mild cough. Continued to monitor patient.

## 2021-11-14 NOTE — Plan of Care (Signed)
  Problem: Acute Rehab PT Goals(only PT should resolve) Goal: Pt Will Go Supine/Side To Sit Outcome: Progressing Flowsheets (Taken 11/14/2021 1534) Pt will go Supine/Side to Sit: with minimal assist Goal: Patient Will Perform Sitting Balance Outcome: Progressing Flowsheets (Taken 11/14/2021 1534) Patient will perform sitting balance: with modified independence Goal: Patient Will Transfer Sit To/From Stand Outcome: Progressing Flowsheets (Taken 11/14/2021 1534) Patient will transfer sit to/from stand:  with moderate assist  from elevated surface Goal: Pt Will Transfer Bed To Chair/Chair To Bed Outcome: Progressing Flowsheets (Taken 11/14/2021 1534) Pt will Transfer Bed to Chair/Chair to Bed:  with mod assist  with max assist Note: Squat pivot transfer   [employer], SPT

## 2021-11-14 NOTE — Progress Notes (Signed)
1 Day Post-Op  Subjective: Patient is meeting post-op goals of passing flatus, urinating, tolerating PO, and pain control with oral medications (Tylenol and Oxy). She reports mild sore throat and cough that have improved with Cepacol and Robitussin. Her JP drain contains dark red liquid.   Objective: Vital signs in last 24 hours: Temp:  [98 F (36.7 C)-99.2 F (37.3 C)] 98.9 F (37.2 C) (11/02 0849) Pulse Rate:  [59-95] 70 (11/02 0849) Resp:  [16-20] 17 (11/02 0849) BP: (139-191)/(64-96) 139/67 (11/02 0849) SpO2:  [93 %-100 %] 100 % (11/02 0849) Last BM Date : 11/09/21  Intake/Output from previous day: 11/01 0701 - 11/02 0700 In: 2520.7 [P.O.:360; I.V.:2060.7; IV Piggyback:100] Out: 2030 [Urine:2000; Drains:30] Intake/Output this shift: Total I/O In: 240 [P.O.:240] Out: -   General appearance: alert and no distress Resp: clear to auscultation bilaterally Cardio: regular rate and rhythm, S1, S2 normal, no murmur, click, rub or gallop Incision/Wound: Laparoscopic sites are non-erythematous and without drainage. JP drain in place with dark red liquid.  Lab Results:  Recent Labs    11/13/21 0431 11/14/21 0358  WBC 4.9 8.3  HGB 11.7* 12.1  HCT 35.6* 35.8*  PLT 177 153   BMET Recent Labs    11/13/21 0431 11/14/21 0358  NA 140 138  K 4.0 4.1  CL 106 103  CO2 26 22  GLUCOSE 121* 167*  BUN 6* 10  CREATININE 1.08* 1.26*  CALCIUM 8.6* 8.5*   PT/INR No results for input(s): "LABPROT", "INR" in the last 72 hours.  Studies/Results: No results found.  Anti-infectives: Anti-infectives (From admission, onward)    Start     Dose/Rate Route Frequency Ordered Stop   11/13/21 0900  cefoTEtan (CEFOTAN) 2 g in sodium chloride 0.9 % 100 mL IVPB        2 g 200 mL/hr over 30 Minutes Intravenous On call to O.R. 11/12/21 1339 11/13/21 1234   11/10/21 0400  piperacillin-tazobactam (ZOSYN) IVPB 3.375 g        3.375 g 12.5 mL/hr over 240 Minutes Intravenous Every 8 hours  11/10/21 0028     11/09/21 2115  piperacillin-tazobactam (ZOSYN) IVPB 3.375 g        3.375 g 100 mL/hr over 30 Minutes Intravenous  Once 11/09/21 2109 11/09/21 2206       Assessment/Plan: s/p Procedure(s): LAPAROSCOPIC CHOLECYSTECTOMY -Meeting post-op goals of pain control, tolerating PO, urinating, and flatus -LFTs down-trending, bilirubin normal -CBC, WBC, Mag normal  AKI -Cr 1.26, GFR 47; baseline Cr 0.9 -Recheck tomorrow  FEN/GI -Advance to soft diet as tolerated   LOS: 5 days   Leonette Nutting, MS3 11/14/2021

## 2021-11-14 NOTE — Evaluation (Signed)
Physical Therapy Evaluation Patient Details Name: [name] MRN: [id number] DOB: May 21, 1955 Today's Date: 11/14/2021  History of Present Illness  [name] is a 66 y.o. female with medical history significant of BMI 50.36, GERD, hyperlipidemia, hypertension, insulin-dependent diabetes mellitus, osteoarthritis, sleep apnea, and more presents the ED with a chief complaint of abdominal pain.  Abdominal pain started at 3:00 in the morning on 28 October.  Patient reports that it is a burning-like pain.  Is located in her epigastrium.  It started as intermittent pain but got progressively worse until it became constant.  She tried Mylanta 3 times without relief.  She took Protonix and did not have relief.  Patient reports she has not had an appetite all day because of the pain.  Her last normal meal was yesterday.  Her last bowel movement was same day as presentation with an enema.  It is normal for her to go 2-3 times per week.  Patient reports that she is completely immobile so she has to use enemas more often and when she was able to move around.  Patient reports that she is immobile secondary to bone-on-bone arthritis in her knees.  She has been to rehab and she has home PT/OT.  She can stand to transfer herself from the wheelchair, she does have a Hoyer lift at home already.  Patient reports she has had some nausea and vomiting.  There is no hematemesis.  Reports the vomit appears as water and is a very small amount.  The only thing that made her pain better was morphine given in the ED.  Patient was admitted in August for the same kind of abdominal pain.  At that time she also had transaminitis.  She was diagnosed with cholelithiasis, but no cholecystitis and no choledocholithiasis.  There is discussion of MRCP which would be technically difficult due to body habitus.  Patient was improving clinically, so patient was discharged with plans to follow-up with GI.   Clinical Impression  Patient  demonstrates labored movement for sitting up at bedside requiring mod assist to elevate trunk and scoot to EOB with increased time and short rest breaks throughout. Patient limited to sitting unsupported at EOB as patient states she is recently non-ambulatory due to chronic knee pain and generalized weakness. Patient left seated EOB after therapy, agreeable to remain sitting up for as long as she can tolerate, with daughter in room - nurse aware. Patient will benefit from continued skilled physical therapy in hospital and recommended venue below to increase strength, balance, endurance for safe ADLs and gait.     Recommendations for follow up therapy are one component of a multi-disciplinary discharge planning process, led by the attending physician.  Recommendations may be updated based on patient status, additional functional criteria and insurance authorization.  Follow Up Recommendations Home health PT      Assistance Recommended at Discharge Intermittent Supervision/Assistance  Patient can return home with the following  Assistance with cooking/housework;A lot of help with walking and/or transfers;A little help with bathing/dressing/bathroom    Equipment Recommendations None recommended by PT  Recommendations for Other Services       Functional Status Assessment Patient has had a recent decline in their functional status and demonstrates the ability to make significant improvements in function in a reasonable and predictable amount of time.     Precautions / Restrictions Precautions Precautions: Fall Restrictions Weight Bearing Restrictions: No      Mobility  Bed Mobility Overal bed mobility: Needs Assistance  Bed Mobility: Supine to Sit     Supine to sit: Mod assist     General bed mobility comments: patient demonstrates labored movement for sitting up at bedside requiring mod assist to elevate trunk and scoot to EOB with increased time and short rest breaks throughout     Transfers                        Ambulation/Gait                  Stairs            Wheelchair Mobility    Modified Rankin (Stroke Patients Only)       Balance Overall balance assessment: Needs assistance   Sitting balance-Leahy Scale: Fair Sitting balance - Comments: seated EOB                                     Pertinent Vitals/Pain Pain Assessment Pain Assessment: 0-10 Pain Score: 7  Pain Location: right knee Pain Descriptors / Indicators: Grimacing, Guarding Pain Intervention(s): Limited activity within patient's tolerance, Monitored during session, Repositioned    Home Living Family/patient expects to be discharged to:: Private residence Living Arrangements: Children Available Help at Discharge: Family;Available 24 hours/day Type of Home: House Home Access: Ramped entrance       Home Layout: One level Home Equipment: Air cabin crew (4 wheels);Grab bars - tub/shower      Prior Function Prior Level of Function : Needs assist       Physical Assist : Mobility (physical);ADLs (physical) Mobility (physical): Bed mobility;Transfers;Gait;Stairs ADLs (physical): IADLs Mobility Comments: non-ambulatory, can sit EOB and stand for very short periods of time ADLs Comments: Independent with performing some ADLs like bathing, feeding, grooming at EOB, but still requires assistance due to being non-ambulatory, gets assist from daughters for all iADLs     Hand Dominance   Dominant Hand: Left    Extremity/Trunk Assessment   Upper Extremity Assessment Upper Extremity Assessment: Generalized weakness    Lower Extremity Assessment Lower Extremity Assessment: Generalized weakness    Cervical / Trunk Assessment Cervical / Trunk Assessment: Normal  Communication   Communication: No difficulties  Cognition Arousal/Alertness: Awake/alert Behavior During Therapy: WFL for tasks assessed/performed Overall Cognitive  Status: Within Functional Limits for tasks assessed                                          General Comments      Exercises     Assessment/Plan    PT Assessment Patient needs continued PT services  PT Problem List Decreased strength;Decreased activity tolerance;Decreased balance;Decreased mobility       PT Treatment Interventions DME instruction;Balance training;Gait training;Stair training;Functional mobility training;Patient/family education;Therapeutic activities;Therapeutic exercise    PT Goals (Current goals can be found in the Care Plan section)  Acute Rehab PT Goals Patient Stated Goal: return home with daughters to assist PT Goal Formulation: With patient/family Time For Goal Achievement: 11/28/21 Potential to Achieve Goals: Good    Frequency Min 3X/week     Co-evaluation               AM-PAC PT "6 Clicks" Mobility  Outcome Measure Help needed turning from your back to your side while in a flat bed without using bedrails?: A Lot  Help needed moving from lying on your back to sitting on the side of a flat bed without using bedrails?: A Lot Help needed moving to and from a bed to a chair (including a wheelchair)?: Total Help needed standing up from a chair using your arms (e.g., wheelchair or bedside chair)?: Total Help needed to walk in hospital room?: Total Help needed climbing 3-5 steps with a railing? : Total 6 Click Score: 8    End of Session   Activity Tolerance: Patient tolerated treatment well;Patient limited by fatigue Patient left: in bed;with family/visitor present;with call bell/phone within reach (seated EOB) Nurse Communication: Mobility status PT Visit Diagnosis: Unsteadiness on feet (R26.81);Other abnormalities of gait and mobility (R26.89);Muscle weakness (generalized) (M62.81)    Time: 9371-6967 PT Time Calculation (min) (ACUTE ONLY): 20 min   Charges:   PT Evaluation $PT Eval Moderate Complexity: 1 Mod PT  Treatments $Therapeutic Activity: 8-22 mins        Zigmund Gottron, SPT

## 2021-11-15 ENCOUNTER — Telehealth: Payer: Self-pay | Admitting: *Deleted

## 2021-11-15 DIAGNOSIS — K8020 Calculus of gallbladder without cholecystitis without obstruction: Secondary | ICD-10-CM | POA: Diagnosis not present

## 2021-11-15 LAB — COMPREHENSIVE METABOLIC PANEL
ALT: 185 U/L — ABNORMAL HIGH (ref 0–44)
AST: 68 U/L — ABNORMAL HIGH (ref 15–41)
Albumin: 3.1 g/dL — ABNORMAL LOW (ref 3.5–5.0)
Alkaline Phosphatase: 132 U/L — ABNORMAL HIGH (ref 38–126)
Anion gap: 11 (ref 5–15)
BUN: 13 mg/dL (ref 8–23)
CO2: 24 mmol/L (ref 22–32)
Calcium: 8 mg/dL — ABNORMAL LOW (ref 8.9–10.3)
Chloride: 102 mmol/L (ref 98–111)
Creatinine, Ser: 1.24 mg/dL — ABNORMAL HIGH (ref 0.44–1.00)
GFR, Estimated: 48 mL/min — ABNORMAL LOW (ref >60)
Glucose, Bld: 156 mg/dL — ABNORMAL HIGH (ref 70–99)
Potassium: 3.6 mmol/L (ref 3.5–5.1)
Sodium: 137 mmol/L (ref 135–145)
Total Bilirubin: 0.8 mg/dL (ref 0.3–1.2)
Total Protein: 6.5 g/dL (ref 6.5–8.1)

## 2021-11-15 LAB — CBC
HCT: 34.5 % — ABNORMAL LOW (ref 36.0–46.0)
Hemoglobin: 11.3 g/dL — ABNORMAL LOW (ref 12.0–15.0)
MCH: 28.1 pg (ref 26.0–34.0)
MCHC: 32.8 g/dL (ref 30.0–36.0)
MCV: 85.8 fL (ref 80.0–100.0)
Platelets: 192 10*3/uL (ref 150–400)
RBC: 4.02 MIL/uL (ref 3.87–5.11)
RDW: 16.9 % — ABNORMAL HIGH (ref 11.5–15.5)
WBC: 7.3 10*3/uL (ref 4.0–10.5)
nRBC: 0 % (ref 0.0–0.2)

## 2021-11-15 LAB — GLUCOSE, CAPILLARY
Glucose-Capillary: 174 mg/dL — ABNORMAL HIGH (ref 70–99)
Glucose-Capillary: 198 mg/dL — ABNORMAL HIGH (ref 70–99)
Glucose-Capillary: 213 mg/dL — ABNORMAL HIGH (ref 70–99)
Glucose-Capillary: 162 mg/dL — ABNORMAL HIGH (ref 70–99)
Glucose-Capillary: 251 mg/dL — ABNORMAL HIGH (ref 70–99)

## 2021-11-15 LAB — MAGNESIUM: Magnesium: 1.8 mg/dL (ref 1.7–2.4)

## 2021-11-15 LAB — SURGICAL PATHOLOGY

## 2021-11-15 NOTE — Progress Notes (Signed)
PROGRESS NOTE    [name]  STM:[id number] DOB: 1955/11/03 DOA: 11/09/2021 PCP: Coral Spikes, DO   Brief Narrative:    66 y/o female with history of DM2, HTN, OSA, Obesity class 3, admitted to the hospital with abdominal pain and elevated LFTs. Noted to have cholelithiasis and similar presentation earlier this year. LFTs trending down with IV fluids. Concern that patient has been passing stones. Currently no evidence of choledocholithiasis on imaging (CT, [place]).  She has undergone laparoscopic cholecystectomy 11/1 and is currently undergoing volume resuscitation for AKI.  Anticipate discharge with home health physical therapy in the next 24-48 hours.  Assessment & Plan:   Principal Problem:   Gallstones Active Problems:   Hypothyroidism   Obstructive sleep apnea   Hypertension   Type 2 diabetes mellitus with hyperlipidemia (HCC)   Transaminitis   Knee pain   SIRS (systemic inflammatory response syndrome) (HCC)  Assessment and Plan:   Transaminitis-downtrending Cholelithiasis -Concern for recurrent choledocholithiasis -CT imaging and ultrasound imaging did not reveal any stones and bile duct or bile duct dilatation -She is noted to have cholelithiasis -Overall LFTs are trending down since admission -Continue IV fluids and recheck labs in a.m. -Status postcholecystectomy 11/1, advance diet per general surgery -Zosyn per general surgery     Mild AKI -Creatinine 1.24 with baseline near 0.8 -Continue IV fluid and monitor strict I's and O's -Recheck a.m. labs -Avoid nephrotoxic agents   Type 2 diabetes -Continued on 70/30 insulin -Continue sliding scale -Blood sugar stable   Atrial flutter -Continue on metoprolol -She is on chronic anticoagulation with Eliquis. Last dose of Eliquis was a.m. of 10/29   Hypertension -Blood pressures currently stable -Okay to resume amlodipine and hold benazepril -Continue metoprolol for now   Bilateral osteoarthritis of  knees -Severely debilitating, mostly wheelchair-bound now -Wishes to pursue operative management, but will need to lose weight prior to being considered for knee replacements -To be discharged with home health physical therapy   Obstructive sleep apnea -Continue CPAP   Hypothyroidism -Continue Synthroid   Obesity, class III -BMI 50.3 -Discussed the importance of healthy eating habits and physical activity     DVT prophylaxis: SCD's Start: 11/12/21 1340 SCDs Start: 11/10/21 0024   Code Status: full code Family Communication: discussed with patient Disposition Plan: Status is: Inpatient Remains inpatient appropriate because: awaiting surgical management of cholelithiasis     Consultants:  GI Gen surgery   Procedures:      Antimicrobials:  Zosyn 10/29>    Subjective: Patient seen and evaluated today with complaints to her knees.  She is tolerating her diet well otherwise.  No acute events overnight.  Objective: Vitals:   11/14/21 1326 11/14/21 2038 11/15/21 0550 11/15/21 0844  BP: (!) 138/58 (!) 131/59 (!) 133/59 135/72  Pulse: 70 69 74 70  Resp:  20 19   Temp: 99 F (37.2 C) 98.6 F (37 C) 99 F (37.2 C)   TempSrc: Oral Oral Oral   SpO2: 98% 96% 95%   Weight:      Height:        Intake/Output Summary (Last 24 hours) at 11/15/2021 1053 Last data filed at 11/15/2021 0800 Gross per 24 hour  Intake 725 ml  Output 945 ml  Net -220 ml   Filed Weights   11/09/21 2011  Weight: 135.2 kg    Examination:  General exam: Appears calm and comfortable, morbidly obese Respiratory system: Clear to auscultation. Respiratory effort normal. Cardiovascular system: S1 & S2 heard, RRR.  Gastrointestinal system: Abdomen is soft, incisions C/D/I Central nervous system: Alert and awake Extremities: No edema Skin: No significant lesions noted Psychiatry: Flat affect.    Data Reviewed: I have personally reviewed following labs and imaging studies  CBC: Recent Labs   Lab 11/09/21 2035 11/10/21 0428 11/12/21 0328 11/13/21 0431 11/14/21 0358 11/15/21 0404  WBC 8.2 5.7 4.7 4.9 8.3 7.3  NEUTROABS 6.8 3.9  --   --   --   --   HGB 13.7 10.9* 9.7* 11.7* 12.1 11.3*  HCT 41.0 33.0* 30.7* 35.6* 35.8* 34.5*  MCV 82.8 85.1 88.0 86.0 85.4 85.8  PLT 255 175 150 177 153 710   Basic Metabolic Panel: Recent Labs  Lab 11/10/21 0428 11/12/21 0328 11/13/21 0431 11/14/21 0358 11/15/21 0404  NA 139 141 140 138 137  K 3.2* 3.3* 4.0 4.1 3.6  CL 107 109 106 103 102  CO2 '25 25 26 22 24  '$ GLUCOSE 162* 95 121* 167* 156*  BUN 11 7* 6* 10 13  CREATININE 0.86 1.01* 1.08* 1.26* 1.24*  CALCIUM 7.8* 7.7* 8.6* 8.5* 8.0*  MG 1.9  --   --  2.1 1.8   GFR: Estimated Creatinine Clearance: 60.9 mL/min (A) (by C-G formula based on SCr of 1.24 mg/dL (H)). Liver Function Tests: Recent Labs  Lab 11/11/21 0425 11/12/21 0328 11/13/21 0431 11/14/21 0358 11/15/21 0404  AST 476* 237* 145* 134* 68*  ALT 565* 352* 291* 254* 185*  ALKPHOS 166* 142* 167* 166* 132*  BILITOT 2.2* 1.0 1.1 0.8 0.8  PROT 6.6 5.9* 6.8 7.1 6.5  ALBUMIN 3.1* 2.8* 3.2* 3.2* 3.1*   Recent Labs  Lab 11/09/21 2036  LIPASE 43   No results for input(s): "AMMONIA" in the last 168 hours. Coagulation Profile: No results for input(s): "INR", "PROTIME" in the last 168 hours. Cardiac Enzymes: No results for input(s): "CKTOTAL", "CKMB", "CKMBINDEX", "TROPONINI" in the last 168 hours. BNP (last 3 results) No results for input(s): "PROBNP" in the last 8760 hours. HbA1C: No results for input(s): "HGBA1C" in the last 72 hours. CBG: Recent Labs  Lab 11/14/21 0459 11/14/21 1120 11/14/21 1645 11/14/21 2348 11/15/21 0557  GLUCAP 203* 189* 180* 198* 174*   Lipid Profile: No results for input(s): "CHOL", "HDL", "LDLCALC", "TRIG", "CHOLHDL", "LDLDIRECT" in the last 72 hours. Thyroid Function Tests: No results for input(s): "TSH", "T4TOTAL", "FREET4", "T3FREE", "THYROIDAB" in the last 72 hours. Anemia  Panel: No results for input(s): "VITAMINB12", "FOLATE", "FERRITIN", "TIBC", "IRON", "RETICCTPCT" in the last 72 hours. Sepsis Labs: No results for input(s): "PROCALCITON", "LATICACIDVEN" in the last 168 hours.  Recent Results (from the past 240 hour(s))  Surgical PCR screen     Status: None   Collection Time: 11/13/21  1:23 AM   Specimen: Nasal Mucosa; Nasal Swab  Result Value Ref Range Status   MRSA, PCR NEGATIVE NEGATIVE Final   Staphylococcus aureus NEGATIVE NEGATIVE Final    Comment: (NOTE) The Xpert SA Assay (FDA approved for NASAL specimens in patients 56 years of age and older), is one component of a comprehensive surveillance program. It is not intended to diagnose infection nor to guide or monitor treatment. Performed at [facility], [address], [place], [place] [address]          Radiology Studies: No results found.      Scheduled Meds:  amLODipine  10 mg Oral Daily   insulin aspart  0-15 Units Subcutaneous Q6H   insulin aspart  0-5 Units Subcutaneous QHS   levothyroxine  100  mcg Oral Q0600   metoprolol succinate  100 mg Oral Daily   mupirocin ointment  1 Application Nasal BID   pantoprazole  40 mg Oral BID AC   pregabalin  100 mg Oral TID   Continuous Infusions:  sodium chloride 100 mL/hr at 11/15/21 0847   piperacillin-tazobactam 3.375 g (11/15/21 0337)     LOS: 6 days    Time spent: 35 minutes    Sugar Vanzandt Darleen Crocker, DO [facility]  If 7PM-7AM, please contact night-coverage [email or web address] 11/15/2021, 10:53 AM

## 2021-11-15 NOTE — Progress Notes (Signed)
2 Days Post-Op  Subjective: [name] is doing well. She denies abdominal pain, fever, nausea, vomiting, and chills. She is tolerating PO well. Her JP drain was dark red in color. She sat on the edge of her bed with assistance from PT yesterday for 45 minutes.   Patient had a discussion with family about next steps after hospitalization. She requests to discharge to rehab facility in [facility] for her chronic bilateral knee pain and immobility. Patient reports prior to 8/19 hospitalization for abdominal pain, she was able to ambulate with walker and do her ADLs. After discharge, patient lost her ability to ambulate and independently achieve her ADLs due to severe knee pain. She was discharged to a SNF in [place] and continued with home health physical therapy but did not feel that home PT helped given her knee pain. Patient is motivated to regain muscle strength and independence.  Objective: Vital signs in last 24 hours: Temp:  [98.3 F (36.8 C)-99 F (37.2 C)] 99 F (37.2 C) (11/03 0550) Pulse Rate:  [67-74] 74 (11/03 0550) Resp:  [17-20] 19 (11/03 0550) BP: (131-140)/(58-68) 133/59 (11/03 0550) SpO2:  [95 %-100 %] 95 % (11/03 0550) Last BM Date : 11/09/21  Intake/Output from previous day: 11/02 0701 - 11/03 0700 In: 725 [P.O.:720] Out: 945 [Urine:900; Drains:45] Intake/Output this shift: No intake/output data recorded.  General appearance: alert and no distress Incision/Wound: Laparoscopic sites are healing well without concern for infection. Staples in place. Pulm: Normal work of breathing Cardio: Normal rate  Lab Results:  Recent Labs    11/14/21 0358 11/15/21 0404  WBC 8.3 7.3  HGB 12.1 11.3*  HCT 35.8* 34.5*  PLT 153 192   BMET Recent Labs    11/14/21 0358 11/15/21 0404  NA 138 137  K 4.1 3.6  CL 103 102  CO2 22 24  GLUCOSE 167* 156*  BUN 10 13  CREATININE 1.26* 1.24*  CALCIUM 8.5* 8.0*   PT/INR No results for input(s): "LABPROT", "INR" in  the last 72 hours.  Studies/Results: No results found.  Anti-infectives: Anti-infectives (From admission, onward)    Start     Dose/Rate Route Frequency Ordered Stop   11/13/21 0900  cefoTEtan (CEFOTAN) 2 g in sodium chloride 0.9 % 100 mL IVPB        2 g 200 mL/hr over 30 Minutes Intravenous On call to O.R. 11/12/21 1339 11/13/21 1234   11/10/21 0400  piperacillin-tazobactam (ZOSYN) IVPB 3.375 g        3.375 g 12.5 mL/hr over 240 Minutes Intravenous Every 8 hours 11/10/21 0028     11/09/21 2115  piperacillin-tazobactam (ZOSYN) IVPB 3.375 g        3.375 g 100 mL/hr over 30 Minutes Intravenous  Once 11/09/21 2109 11/09/21 2206       Assessment/Plan: s/p Procedure(s): LAPAROSCOPIC CHOLECYSTECTOMY -Patient meeting post-op goals of pain control, passing flatus/BM, and sitting on bed -LFTs continue to downtrend -Mag normal, WBC normal, H/H 11.3/34.5 -Surgery will follow peripherally and see patient as needed as patient is meeting all post-op goals  AKI -Cr downtrending 1.26 > 1.24 -Dr. Manuella Ghazi following  FEN/GI -Continuing heart healthy diet  Dispo -Case manager and Dr. Manuella Ghazi following for SNF vs home health PT   LOS: 6 days   Leonette Nutting 11/15/2021

## 2021-11-15 NOTE — Telephone Encounter (Signed)
Daughter called and stated they do not feel like her whole knee issues is just arthritis and weight. They stated they patient has always had arthritis but could walk but after the last hospitalization she couldn't hardly walk and currently she can't even hardly bear weight. They stated they feel it is a nerve issue because the pain comes from the knee and shoots in the thigh and hip and they can't wait 9-10 months for her to lose enough weight to be able to have knee replacement to be able to walk and don't know if that would even fix a nerve issue.   They also want to know if you were able to speak to someone about getting her insurance to pay for her depends diapers at [facility]

## 2021-11-15 NOTE — Care Management Important Message (Signed)
Important Message  Patient Details  Name: [name] MRN: [id number] Date of Birth: 1955-05-30   Medicare Important Message Given:  Yes     Tommy Medal 11/15/2021, 11:37 AM

## 2021-11-15 NOTE — Progress Notes (Signed)
Tele called patient running [employer], per tele this is new. Patient lying in bed resting with family at bedside. MD Manuella Ghazi made aware.

## 2021-11-15 NOTE — TOC Progression Note (Signed)
Transition of Care [facility]) - Progression Note    Patient Details  Name: [name] MRN: [id number] Date of Birth: 1955-10-24  [facility] CM/SW Contact  Boneta Lucks, RN Phone Number: 11/15/2021, 11:43 AM  Clinical Narrative:   PT is recommending HHPT. Patients daughter at the bedside. Patient is active with [facility] for PT. She is asking to get that extended for more visits. MD will order HHPT, Marjory Lies updated.     Expected Discharge Plan: [place] Barriers to Discharge: Continued Medical Work up  Expected Discharge Plan and Services Expected Discharge Plan: [place]      Readmission Risk Interventions    11/15/2021   11:42 AM  Readmission Risk Prevention Plan  Transportation Screening Complete  PCP or Specialist Appt within 5-7 Days Not Complete  Home Care Screening Complete  Medication Review (RN CM) Complete

## 2021-11-15 NOTE — Progress Notes (Signed)
Patients family requested to speak with MD. MD Manuella Ghazi made aware. Contacted family via phone.

## 2021-11-16 DIAGNOSIS — K8020 Calculus of gallbladder without cholecystitis without obstruction: Secondary | ICD-10-CM | POA: Diagnosis not present

## 2021-11-16 LAB — COMPREHENSIVE METABOLIC PANEL
ALT: 122 U/L — ABNORMAL HIGH (ref 0–44)
AST: 35 U/L (ref 15–41)
Albumin: 2.7 g/dL — ABNORMAL LOW (ref 3.5–5.0)
Alkaline Phosphatase: 103 U/L (ref 38–126)
Anion gap: 8 (ref 5–15)
BUN: 10 mg/dL (ref 8–23)
CO2: 21 mmol/L — ABNORMAL LOW (ref 22–32)
Calcium: 6.9 mg/dL — ABNORMAL LOW (ref 8.9–10.3)
Chloride: 110 mmol/L (ref 98–111)
Creatinine, Ser: 0.85 mg/dL (ref 0.44–1.00)
GFR, Estimated: >60 mL/min (ref >60)
Glucose, Bld: 142 mg/dL — ABNORMAL HIGH (ref 70–99)
Potassium: 2.9 mmol/L — ABNORMAL LOW (ref 3.5–5.1)
Sodium: 139 mmol/L (ref 135–145)
Total Bilirubin: 0.7 mg/dL (ref 0.3–1.2)
Total Protein: 5.9 g/dL — ABNORMAL LOW (ref 6.5–8.1)

## 2021-11-16 LAB — MAGNESIUM
Magnesium: 1.5 mg/dL — ABNORMAL LOW (ref 1.7–2.4)
Magnesium: 2.2 mg/dL (ref 1.7–2.4)

## 2021-11-16 LAB — POTASSIUM
Potassium: 4.1 mmol/L (ref 3.5–5.1)
Potassium: 3.9 mmol/L (ref 3.5–5.1)

## 2021-11-16 LAB — GLUCOSE, CAPILLARY
Glucose-Capillary: 173 mg/dL — ABNORMAL HIGH (ref 70–99)
Glucose-Capillary: 179 mg/dL — ABNORMAL HIGH (ref 70–99)
Glucose-Capillary: 201 mg/dL — ABNORMAL HIGH (ref 70–99)
Glucose-Capillary: 226 mg/dL — ABNORMAL HIGH (ref 70–99)
Glucose-Capillary: 242 mg/dL — ABNORMAL HIGH (ref 70–99)

## 2021-11-16 LAB — HEMOGLOBIN AND HEMATOCRIT, BLOOD
HCT: 34.8 % — ABNORMAL LOW (ref 36.0–46.0)
Hemoglobin: 11.5 g/dL — ABNORMAL LOW (ref 12.0–15.0)

## 2021-11-16 MED ORDER — DICLOFENAC SODIUM 1 % EX GEL
4.0000 g | Freq: Four times a day (QID) | CUTANEOUS | Status: DC
  Administered 2021-11-16 (×2): 4 g via TOPICAL
  Filled 2021-11-16: qty 100

## 2021-11-16 MED ORDER — POTASSIUM CHLORIDE CRYS ER 20 MEQ PO TBCR
40.0000 meq | EXTENDED_RELEASE_TABLET | Freq: Once | ORAL | Status: AC
  Administered 2021-11-16: 40 meq via ORAL
  Filled 2021-11-16: qty 2

## 2021-11-16 MED ORDER — KETOROLAC TROMETHAMINE 30 MG/ML IJ SOLN
30.0000 mg | Freq: Once | INTRAMUSCULAR | Status: AC
  Administered 2021-11-16: 30 mg via INTRAVENOUS
  Filled 2021-11-16: qty 1

## 2021-11-16 MED ORDER — OXYCODONE HCL 5 MG PO TABS: 5.0000 mg | ORAL_TABLET | ORAL | 0 refills | Status: DC | PRN

## 2021-11-16 MED ORDER — POTASSIUM CHLORIDE 10 MEQ/100ML IV SOLN: 10.0000 meq | INTRAVENOUS | Status: AC

## 2021-11-16 MED ORDER — POTASSIUM CHLORIDE 10 MEQ/100ML IV SOLN
10.0000 meq | INTRAVENOUS | Status: AC
  Administered 2021-11-16 (×2): 10 meq via INTRAVENOUS
  Filled 2021-11-16 (×2): qty 100

## 2021-11-16 MED ORDER — SORBITOL 70 % SOLN
960.0000 mL | TOPICAL_OIL | Freq: Once | ORAL | Status: DC
  Filled 2021-11-16: qty 240

## 2021-11-16 MED ORDER — PREGABALIN 75 MG PO CAPS
200.0000 mg | ORAL_CAPSULE | Freq: Three times a day (TID) | ORAL | Status: DC
  Administered 2021-11-16 (×3): 200 mg via ORAL
  Filled 2021-11-16 (×3): qty 1

## 2021-11-16 MED ORDER — DICLOFENAC SODIUM 1 % EX GEL: 4.0000 g | Freq: Four times a day (QID) | CUTANEOUS | 0 refills | Status: DC

## 2021-11-16 MED ORDER — MAGNESIUM SULFATE 2 GM/50ML IV SOLN
2.0000 g | Freq: Once | INTRAVENOUS | Status: AC
  Administered 2021-11-16: 2 g via INTRAVENOUS
  Filled 2021-11-16: qty 50

## 2021-11-16 MED ORDER — PREGABALIN 200 MG PO CAPS: 200.0000 mg | ORAL_CAPSULE | Freq: Three times a day (TID) | ORAL | 0 refills | Status: DC

## 2021-11-16 NOTE — Discharge Instructions (Signed)
JP drain Care: Please keep the drain clean and dry. Replace the gauze/ tape around the drain if it gets dirty or wet/ saturated. Please do not mess with or cut the stitch that is keeping the drain in place. Secure the drain to your clothes so that it does not get dislodged.  You may want to wear a binder around your abdomen (girdle) at night for sleeping if you are worried about it getting pulled out.  Please record the output from the drain daily including the color and the amount in milliliters.  Please keep the drain covered with plastic and tape when you shower so that it does not get wet.   Discharge Laparoscopic Surgery Instructions:  Common Complaints: Right shoulder pain is common after laparoscopic surgery. This is secondary to the gas used in the surgery being trapped under the diaphragm.  Walk to help your body absorb the gas. This will improve in a few days. Pain at the port sites are common, especially the larger port sites. This will improve with time.  Some nausea is common and poor appetite. The main goal is to stay hydrated the first few days after surgery.   Diet/ Activity: Diet as tolerated. You may not have an appetite, but it is important to stay hydrated. Drink 64 ounces of water a day. Your appetite will return with time.  Shower per your regular routine daily.  Walk everyday for at least 15-20 minutes. Deep cough and move around every 1-2 hours in the first few days after surgery.  Do not lift > 10 lbs, perform excessive bending, pushing, pulling, squatting for 1-2 weeks after surgery.  Do not pick at the staples.  Do not place lotions or balms on your incision unless instructed to specifically by Dr. Constance Haw.    Contact Information: If you have questions or concerns, please call our office, [phone], Monday- Thursday 8AM-5PM and Friday 8AM-12Noon.  If it is after hours or on the weekend, please call [facility] Main Number, [phone], [phone], and ask to  speak to the surgeon on call for Dr. Constance Haw at [facility].

## 2021-11-16 NOTE — Progress Notes (Signed)
Patient had a bowel movement, patient refused to allow staff to change her due to pain when turning. Patient called daughters to help assist cleaning patient up. Patient cleaned and give PRN for pain. Patient refused enema at this time. Patients family requested patient be seen by neurologist due to patients pain at knee radiating up leg. Started potassium IV reduced rate to 75 due to patient complaining of burning. MD Manuella Ghazi made aware.

## 2021-11-16 NOTE — TOC Transition Note (Signed)
Transition of Care [facility]) - CM/SW Discharge Note   Patient Details  Name: [name] MRN: [id number] Date of Birth: [birth year]  Transition of Care [facility]) CM/SW Contact:  Shade Flood, LCSW Phone Number: 11/16/2021, 3:55 PM   Clinical Narrative:     Pt stable for dc per MD. [name] at [facility]. EMS arranged. No other TOC needs for dc.  Final next level of care: [place] Barriers to Discharge: Barriers Resolved   Patient Goals and CMS Choice        Discharge Placement                       Discharge Plan and Services                                     Social Determinants of Health (SDOH) Interventions     Readmission Risk Interventions    11/15/2021   11:42 AM  Readmission Risk Prevention Plan  Transportation Screening Complete  PCP or Specialist Appt within 5-7 Days Not Complete  Home Care Screening Complete  Medication Review (RN CM) Complete

## 2021-11-16 NOTE — Discharge Summary (Signed)
Physician Discharge Summary  [name] OZD:[id number] DOB: 1955-06-28 DOA: 11/09/2021  PCP: Coral Spikes, DO  Admit date: 11/09/2021  Discharge date: 11/16/2021  Admitted From:Home  Disposition:  Home  Recommendations for Outpatient Follow-up:  Follow up with PCP in 1-2 weeks Follow-up with general surgery Dr. Constance Haw as scheduled 11/9 Continue on increased dose of Lyrica 200 mg 3 times daily and also start Voltaren gel and oxycodone given for 10 tablets and 0 refills for pain management Continue other home medications as prior  Home Health:Yes with PT  Equipment/Devices:  Discharge Condition:Stable  CODE STATUS: Full  Diet recommendation: Heart Healthy/Carb modified  Brief/Interim Summary:  66 y/o female with history of DM2, HTN, OSA, Obesity class 3, admitted to the hospital with abdominal pain and elevated LFTs. Noted to have cholelithiasis and similar presentation earlier this year. LFTs trending down with IV fluids. Concern that patient has been passing stones. Currently no evidence of choledocholithiasis on imaging (CT, [place]).  She has undergone laparoscopic cholecystectomy 11/1 and has required a slightly prolonged stay due to elevated creatinine level requiring some IV fluid hydration.  Her renal function has corrected and she also had some mild electrolyte abnormalities that require correction.  She was seen by physical therapy with recommendations to continue home health physical therapy on discharge and further pain management is ordered as noted above.  She will require ongoing titration of these pain medications as well as outpatient follow-up with general surgery as scheduled.  No other acute events noted throughout the course of the stay.  Discharge Diagnoses:  Principal Problem:   Gallstones Active Problems:   Hypothyroidism   Obstructive sleep apnea   Hypertension   Type 2 diabetes mellitus with hyperlipidemia (HCC)   Transaminitis   Knee pain   SIRS  (systemic inflammatory response syndrome) (HCC)  Principal discharge diagnosis: Acute cholecystitis status post laparoscopic cholecystectomy 11/1 with associated transaminitis.   Discharge Instructions  Discharge Instructions     Diet - low sodium heart healthy   Complete by: As directed    Increase activity slowly   Complete by: As directed    No wound care   Complete by: As directed       Allergies as of 11/16/2021       Reactions   Procardia [nifedipine] Other (See Comments)   MIGRAINES   Aspirin    Unable to take due to blood pressure medication   Diltiazem Itching        Medication List     TAKE these medications    albuterol 108 (90 Base) MCG/ACT inhaler Commonly known as: Ventolin HFA INHALE 2 PUFFS INTO THE LUNGS EVERY 6 HOURS AS NEEDED FOR SHORTNESS OF BREATH OR WHEEZING. What changed:  how much to take how to take this when to take this reasons to take this additional instructions   albuterol (2.5 MG/3ML) 0.083% nebulizer solution Commonly known as: PROVENTIL USE 1 VIAL IN NEBULIZER EVERY 6 HOURS AS NEEDED FOR WHEEZING OR SHORTNESS OF BREATH. What changed: See the new instructions.   amLODipine 10 MG tablet Commonly known as: NORVASC TAKE (1) TABLET BY MOUTH ONCE DAILY. What changed: See the new instructions.   benazepril 40 MG tablet Commonly known as: LOTENSIN TAKE (1) TABLET BY MOUTH ONCE DAILY. What changed: See the new instructions.   Depend Underwear X-Large Misc Use daily as needed.   diclofenac Sodium 1 % Gel Commonly known as: VOLTAREN Apply 4 g topically 4 (four) times daily.   Eliquis 5  MG Tabs tablet Generic drug: apixaban TAKE (1) TABLET BY MOUTH TWICE DAILY. What changed: See the new instructions.   fluticasone 44 MCG/ACT inhaler Commonly known as: Flovent HFA Inhale 2 puffs into the lungs 2 (two) times daily.   HumaLOG Mix 75/25 KwikPen (75-25) 100 UNIT/ML Kwikpen Generic drug: Insulin Lispro Prot & Lispro Inject 12  Units into the skin in the morning and at bedtime.   Insulin Pen Needle 31G X 8 MM Misc Use to inject insulin one time daily   levothyroxine 100 MCG tablet Commonly known as: SYNTHROID Take 100 mcg by mouth daily before breakfast.   metoprolol succinate 100 MG 24 hr tablet Commonly known as: TOPROL-XL Take 1 tablet (100 mg total) by mouth daily. Take with or immediately following a meal.   OneTouch Ultra test strip Generic drug: glucose blood USE TO TEST BLOOD SUGAR 4 TIMES DAILY AS DIRECTED.   oxyCODONE 5 MG immediate release tablet Commonly known as: Oxy IR/ROXICODONE Take 1 tablet (5 mg total) by mouth every 4 (four) hours as needed for severe pain or breakthrough pain.   pantoprazole 40 MG tablet Commonly known as: PROTONIX Take 1 tablet (40 mg total) by mouth 2 (two) times daily before a meal.   polyethylene glycol 17 g packet Commonly known as: MIRALAX / GLYCOLAX Take 17 g by mouth daily as needed for mild constipation.   pregabalin 100 MG capsule Commonly known as: LYRICA Take 1 capsule (100 mg total) by mouth 3 (three) times daily. What changed: Another medication with the same name was added. Make sure you understand how and when to take each.   pregabalin 200 MG capsule Commonly known as: LYRICA Take 1 capsule (200 mg total) by mouth 3 (three) times daily. What changed: You were already taking a medication with the same name, and this prescription was added. Make sure you understand how and when to take each.   traMADol 50 MG tablet Commonly known as: ULTRAM Take 1-1.5 tablets (50-75 mg total) by mouth every 8 (eight) hours as needed for moderate pain or severe pain.   triamcinolone cream 0.1 % Commonly known as: KENALOG Apply 1 Application topically 2 (two) times daily. To affected area   Trulicity 4.94 WH/6.7RF Sopn Generic drug: Dulaglutide Inject 0.75 mg into the skin once a week.        Follow-up Information     [facility] Follow up.    Why: PT will call to schedule your next home visit.        Coral Spikes, DO. Schedule an appointment as soon as possible for a visit in 1 week(s).   Specialty: Family Medicine Contact information: [place] [address] [phone]         Virl Cagey, MD. Go on 11/21/2021.   Specialty: General Surgery Contact information: [address] [place] [facility] [address] [phone]                Allergies  Allergen Reactions   Procardia [Nifedipine] Other (See Comments)    MIGRAINES   Aspirin     Unable to take due to blood pressure medication   Diltiazem Itching    Consultations: GI GS   Procedures/Studies: ECHOCARDIOGRAM COMPLETE  Result Date: 11/12/2021    ECHOCARDIOGRAM REPORT   Patient Name:   ARMANI GAWLIK Date of Exam: 11/12/2021 Medical Rec #:  [id number]        Height:       64.5 in  Accession #:    [id number]       Weight:       298.0 lb Date of Birth:  31-Jan-1955        BSA:          2.330 m Patient Age:    66 years         BP:           138/68 mmHg Patient Gender: F                HR:           67 bpm. Exam Location:  [facility] Procedure: 2D Echo, Cardiac Doppler, Color Doppler and Intracardiac            Opacification Agent Indications:    Pre-operative cardiovascular examination  History:        Patient has prior history of Echocardiogram examinations, most                 recent 02/15/2015. CHF, Arrythmias:Atrial Flutter; Risk                 Factors:Hypertension, Diabetes and Former Smoker. Breast CA.  Sonographer:    Wenda Low Referring Phys: [phone] [facility]  Sonographer Comments: Technically difficult study due to poor echo windows and patient is obese. IMPRESSIONS  1. Left ventricular ejection fraction, by estimation, is 65 to 70%. The left ventricle has normal function. The left ventricle has no regional wall motion abnormalities. There is moderate concentric left ventricular hypertrophy. Left ventricular  diastolic parameters were normal.  2. Right ventricular systolic function is normal. The right ventricular size is normal. Tricuspid regurgitation signal is inadequate for assessing PA pressure.  3. The mitral valve is grossly normal, mildly calcified. Trivial mitral valve regurgitation.  4. The aortic valve was not well visualized. There is mild calcification of the aortic valve. Aortic valve regurgitation is not visualized. Aortic valve sclerosis is present, with no evidence of aortic valve stenosis. Aortic valve mean gradient measures  3.0 mmHg.  5. The inferior vena cava is normal in size with greater than 50% respiratory variability, suggesting right atrial pressure of 3 mmHg. Comparison(s): Prior images unable to be directly viewed. FINDINGS  Left Ventricle: Left ventricular ejection fraction, by estimation, is 65 to 70%. The left ventricle has normal function. The left ventricle has no regional wall motion abnormalities. Definity contrast agent was given IV to delineate the left ventricular  endocardial borders. The left ventricular internal cavity size was normal in size. There is moderate concentric left ventricular hypertrophy. Left ventricular diastolic parameters were normal. Right Ventricle: The right ventricular size is normal. No increase in right ventricular wall thickness. Right ventricular systolic function is normal. Tricuspid regurgitation signal is inadequate for assessing PA pressure. Left Atrium: Left atrial size was normal in size. Right Atrium: Right atrial size was normal in size. Pericardium: There is no evidence of pericardial effusion. Mitral Valve: The mitral valve is grossly normal. There is mild calcification of the mitral valve leaflet(s). Trivial mitral valve regurgitation. MV peak gradient, 3.1 mmHg. The mean mitral valve gradient is 1.0 mmHg. Tricuspid Valve: The tricuspid valve is grossly normal. Tricuspid valve regurgitation is trivial. Aortic Valve: The aortic valve was not  well visualized. There is mild calcification of the aortic valve. Aortic valve regurgitation is not visualized. Aortic valve sclerosis is present, with no evidence of aortic valve stenosis. Aortic valve mean gradient measures 3.0 mmHg. Aortic valve peak gradient measures 6.6 mmHg. Aortic  valve area, by VTI measures 3.13 cm. Pulmonic Valve: The pulmonic valve was not well visualized. Pulmonic valve regurgitation is trivial. Aorta: The aortic root is normal in size and structure. Venous: The inferior vena cava is normal in size with greater than 50% respiratory variability, suggesting right atrial pressure of 3 mmHg. IAS/Shunts: No atrial level shunt detected by color flow Doppler.  LEFT VENTRICLE PLAX 2D LVIDd:         4.80 cm   Diastology LVIDs:         3.00 cm   LV e' medial:    9.36 cm/s LV PW:         1.30 cm   LV E/e' medial:  7.3 LV IVS:        1.30 cm   LV e' lateral:   8.27 cm/s LVOT diam:     2.00 cm   LV E/e' lateral: 8.2 LV SV:         84 LV SV Index:   36 LVOT Area:     3.14 cm  LEFT ATRIUM           Index LA diam:      3.90 cm 1.67 cm/m LA Vol (A4C): 50.4 ml 21.63 ml/m  AORTIC VALVE AV Area (Vmax):    2.85 cm AV Area (Vmean):   2.80 cm AV Area (VTI):     3.13 cm AV Vmax:           128.00 cm/s AV Vmean:          84.600 cm/s AV VTI:            0.267 m AV Peak Grad:      6.6 mmHg AV Mean Grad:      3.0 mmHg LVOT Vmax:         116.00 cm/s LVOT Vmean:        75.300 cm/s LVOT VTI:          0.266 m LVOT/AV VTI ratio: 1.00  AORTA Ao Root diam: 3.10 cm MITRAL VALVE MV Area (PHT): 3.17 cm    SHUNTS MV Area VTI:   2.99 cm    Systemic VTI:  0.27 m MV Peak grad:  3.1 mmHg    Systemic Diam: 2.00 cm MV Mean grad:  1.0 mmHg MV Vmax:       0.88 m/s MV Vmean:      51.9 cm/s MV Decel Time: 239 msec MV E velocity: 68.10 cm/s MV A velocity: 87.40 cm/s MV E/A ratio:  0.78 Rozann Lesches MD Electronically signed by Rozann Lesches MD Signature Date/Time: 11/12/2021/10:42:41 AM    Final    US Abdomen Limited RUQ  (LIVER/GB)  Result Date: 11/10/2021 CLINICAL DATA:  Transaminitis. EXAM: ULTRASOUND ABDOMEN LIMITED RIGHT UPPER QUADRANT COMPARISON:  CT, 11/09/2021. FINDINGS: Gallbladder: Dependent stones. Mildly distended. No wall thickening or pericholecystic fluid. Common bile duct: Diameter: 4 mm Liver: Limited visualization. Increased parenchymal echogenicity. No defined mass. Portal vein is patent on color Doppler imaging with normal direction of blood flow towards the liver. Other: None. IMPRESSION: 1. Limited exam.  No acute findings. 2. Increased liver parenchymal echogenicity consistent with hepatic steatosis. 3. Cholelithiasis. Electronically Signed   By: Lajean Manes M.D.   On: 11/10/2021 10:39   CT Abdomen Pelvis W Contrast  Result Date: 11/09/2021 CLINICAL DATA:  Acute abdominal pain. EXAM: CT ABDOMEN AND PELVIS WITH CONTRAST TECHNIQUE: Multidetector CT imaging of the abdomen and pelvis was performed using the standard protocol following bolus administration of  intravenous contrast. RADIATION DOSE REDUCTION: This exam was performed according to the departmental dose-optimization program which includes automated exposure control, adjustment of the mA and/or kV according to patient size and/or use of iterative reconstruction technique. CONTRAST:  78m OMNIPAQUE IOHEXOL 300 MG/ML  SOLN COMPARISON:  CT abdomen and pelvis 08/31/2021 FINDINGS: Lower chest: No acute abnormality. Hepatobiliary: No focal liver abnormality is seen. No gallstones, gallbladder wall thickening, or biliary dilatation. Pancreas: Unremarkable. No pancreatic ductal dilatation or surrounding inflammatory changes. Spleen: Normal in size without focal abnormality. Adrenals/Urinary Tract: There is a 2.5 cm left renal cyst, unchanged. Otherwise, the kidneys, adrenal glands and bladder are within normal limits. Stomach/Bowel: Stomach is within normal limits. Appendix appears normal. No evidence of bowel wall thickening, distention, or inflammatory  changes. Vascular/Lymphatic: Aortic atherosclerosis. No enlarged abdominal or pelvic lymph nodes. Reproductive: Calcified uterine fibroid seen measuring 1 cm. No adnexal mass. Other: No abdominal wall hernia or abnormality. No abdominopelvic ascites. Musculoskeletal: Degenerative changes affect the spine. IMPRESSION: 1. No acute localizing process in the abdomen or pelvis. 2. Left Bosniak I benign renal cyst measuring 2.5 cm. No follow-up imaging is recommended. JACR 2018 Feb; 264-273, Management of the Incidental Renal Mass on CT, [employer] 2021; 814-848, Bosniak Classification of Cystic Renal Masses, Version 2019. 3. Calcified uterine fibroid. Aortic Atherosclerosis (ICD10-I70.0). Electronically Signed   By: ARonney AstersM.D.   On: 11/09/2021 22:41   DG Chest Portable 1 View  Result Date: 11/09/2021 CLINICAL DATA:  Shortness of breath EXAM: PORTABLE CHEST 1 VIEW COMPARISON:  Chest x-ray 04/08/2015 FINDINGS: There is some atelectatic changes in the lower lungs. There is no lung consolidation, pleural effusion or pneumothorax. Cardiomediastinal silhouette is within normal limits. Surgical clips overlie the lower neck, unchanged. No acute fractures. IMPRESSION: No active disease. Atelectatic changes in the lower lungs. Electronically Signed   By: ARonney AstersM.D.   On: 11/09/2021 22:10     Discharge Exam: Vitals:   11/16/21 0845 11/16/21 1307  BP: (!) 143/70 (!) 134/52  Pulse: 72 72  Resp:  17  Temp:  99.1 F (37.3 C)  SpO2:  96%   Vitals:   11/16/21 0006 11/16/21 0516 11/16/21 0845 11/16/21 1307  BP:  (!) 152/60 (!) 143/70 (!) 134/52  Pulse:  67 72 72  Resp:    17  Temp: 98.2 F (36.8 C) 97.8 F (36.6 C)  99.1 F (37.3 C)  TempSrc: Oral Oral  Oral  SpO2:  97%  96%  Weight:      Height:        General: Pt is alert, awake, not in acute distress, obese Cardiovascular: RRR, S1/S2 +, no rubs, no gallops Respiratory: CTA bilaterally, no wheezing, no rhonchi Abdominal: Soft, NT,  ND, bowel sounds + Extremities: no edema, no cyanosis    The results of significant diagnostics from this hospitalization (including imaging, microbiology, ancillary and laboratory) are listed below for reference.     Microbiology: Recent Results (from the past 240 hour(s))  Surgical PCR screen     Status: None   Collection Time: 11/13/21  1:23 AM   Specimen: Nasal Mucosa; Nasal Swab  Result Value Ref Range Status   MRSA, PCR NEGATIVE NEGATIVE Final   Staphylococcus aureus NEGATIVE NEGATIVE Final    Comment: (NOTE) The Xpert SA Assay (FDA approved for NASAL specimens in patients 269years of age and older), is one component of a comprehensive surveillance program. It is not intended to diagnose infection nor to guide or monitor treatment. Performed  at [facility], [address], [place], [place] [address]      Labs: BNP (last 3 results) No results for input(s): "BNP" in the last 8760 hours. Basic Metabolic Panel: Recent Labs  Lab 11/10/21 0428 11/12/21 0328 11/13/21 0431 11/14/21 0358 11/15/21 0404 11/16/21 0712 11/16/21 1340 11/16/21 1518  NA 139 141 140 138 137 139  --   --   K 3.2* 3.3* 4.0 4.1 3.6 2.9* 4.1 3.9  CL 107 109 106 103 102 110  --   --   CO2 '25 25 26 22 24 '$ 21*  --   --   GLUCOSE 162* 95 121* 167* 156* 142*  --   --   BUN 11 7* 6* '10 13 10  '$ --   --   CREATININE 0.86 1.01* 1.08* 1.26* 1.24* 0.85  --   --   CALCIUM 7.8* 7.7* 8.6* 8.5* 8.0* 6.9*  --   --   MG 1.9  --   --  2.1 1.8 1.5* 2.2  --    Liver Function Tests: Recent Labs  Lab 11/12/21 0328 11/13/21 0431 11/14/21 0358 11/15/21 0404 11/16/21 0712  AST 237* 145* 134* 68* 35  ALT 352* 291* 254* 185* 122*  ALKPHOS 142* 167* 166* 132* 103  BILITOT 1.0 1.1 0.8 0.8 0.7  PROT 5.9* 6.8 7.1 6.5 5.9*  ALBUMIN 2.8* 3.2* 3.2* 3.1* 2.7*   Recent Labs  Lab 11/09/21 2036  LIPASE 43   No results for input(s): "AMMONIA" in the last 168 hours. CBC: Recent Labs  Lab 11/09/21 2035 11/10/21 0428  11/12/21 0328 11/13/21 0431 11/14/21 0358 11/15/21 0404 11/16/21 0712 11/16/21 1340  WBC 8.2 5.7 4.7 4.9 8.3 7.3 5.8  --   NEUTROABS 6.8 3.9  --   --   --   --   --   --   HGB 13.7 10.9* 9.7* 11.7* 12.1 11.3* 8.9* 11.5*  HCT 41.0 33.0* 30.7* 35.6* 35.8* 34.5* 27.3* 34.8*  MCV 82.8 85.1 88.0 86.0 85.4 85.8 87.5  --   PLT 255 175 150 177 153 192 146*  --    Cardiac Enzymes: No results for input(s): "CKTOTAL", "CKMB", "CKMBINDEX", "TROPONINI" in the last 168 hours. BNP: Invalid input(s): "POCBNP" CBG: Recent Labs  Lab 11/15/21 1149 11/15/21 1701 11/15/21 2106 11/16/21 0009 11/16/21 0600  GLUCAP 213* 162* 251* 173* 201*   D-Dimer No results for input(s): "DDIMER" in the last 72 hours. Hgb A1c No results for input(s): "HGBA1C" in the last 72 hours. Lipid Profile No results for input(s): "CHOL", "HDL", "LDLCALC", "TRIG", "CHOLHDL", "LDLDIRECT" in the last 72 hours. Thyroid function studies No results for input(s): "TSH", "T4TOTAL", "T3FREE", "THYROIDAB" in the last 72 hours.  Invalid input(s): "FREET3" Anemia work up No results for input(s): "VITAMINB12", "FOLATE", "FERRITIN", "TIBC", "IRON", "RETICCTPCT" in the last 72 hours. Urinalysis    Component Value Date/Time   COLORURINE STRAW (A) 08/31/2021 1036   APPEARANCEUR CLEAR 08/31/2021 1036   LABSPEC 1.010 08/31/2021 1036   PHURINE 6.0 08/31/2021 1036   GLUCOSEU NEGATIVE 08/31/2021 1036   HGBUR SMALL (A) 08/31/2021 1036   BILIRUBINUR NEGATIVE 08/31/2021 1036   KETONESUR 5 (A) 08/31/2021 1036   PROTEINUR 100 (A) 08/31/2021 1036   UROBILINOGEN 0.2 09/06/2013 1040   NITRITE NEGATIVE 08/31/2021 1036   LEUKOCYTESUR NEGATIVE 08/31/2021 1036   Sepsis Labs Recent Labs  Lab 11/13/21 0431 11/14/21 0358 11/15/21 0404 11/16/21 0712  WBC 4.9 8.3 7.3 5.8   Microbiology Recent Results (from the past 240 hour(s))  Surgical PCR screen     Status: None   Collection Time: 11/13/21  1:23 AM   Specimen: Nasal Mucosa; Nasal  Swab  Result Value Ref Range Status   MRSA, PCR NEGATIVE NEGATIVE Final   Staphylococcus aureus NEGATIVE NEGATIVE Final    Comment: (NOTE) The Xpert SA Assay (FDA approved for NASAL specimens in patients 41 years of age and older), is one component of a comprehensive surveillance program. It is not intended to diagnose infection nor to guide or monitor treatment. Performed at [facility], [address], [place], [place] [address]      Time coordinating discharge: 35 minutes  SIGNED:   Rodena Goldmann, DO [facility] 11/16/2021, 3:52 PM  If 7PM-7AM, please contact night-coverage [email or web address]

## 2021-11-17 LAB — GLUCOSE, CAPILLARY
Glucose-Capillary: 185 mg/dL — ABNORMAL HIGH (ref 70–99)
Glucose-Capillary: 197 mg/dL — ABNORMAL HIGH (ref 70–99)

## 2021-11-17 NOTE — Progress Notes (Signed)
Pt d/c'd home with all belongings and daughter in tow.

## 2021-11-18 ENCOUNTER — Telehealth: Payer: Self-pay | Admitting: *Deleted

## 2021-11-18 LAB — CBC
HCT: 27.3 % — ABNORMAL LOW (ref 36.0–46.0)
Hemoglobin: 8.9 g/dL — ABNORMAL LOW (ref 12.0–15.0)
MCH: 28.5 pg (ref 26.0–34.0)
MCHC: 32.6 g/dL (ref 30.0–36.0)
MCV: 87.5 fL (ref 80.0–100.0)
Platelets: 146 10*3/uL — ABNORMAL LOW (ref 150–400)
RBC: 3.12 MIL/uL — ABNORMAL LOW (ref 3.87–5.11)
RDW: 17 % — ABNORMAL HIGH (ref 11.5–15.5)
WBC: 5.8 10*3/uL (ref 4.0–10.5)
nRBC: 0 % (ref 0.0–0.2)

## 2021-11-18 NOTE — Telephone Encounter (Signed)
Coral Spikes, DO   She has severe arthritis of the knee and also has degenerative disc of the low back.   I do not think the surgeons are going to do surgery on the knee or address her back surgically at her current weight. I would recommend proceeding with the referral for bariatric surgery and go from there. If she would like to see neurosurgery to discuss the back issue (likely irritating a nerve) I am happy to put in a referral. Also, I would continue with PT.  Lastly, will you contact the pharmacy and see if supplies are covered.  Dr. Lacinda Axon

## 2021-11-18 NOTE — Telephone Encounter (Signed)
Transition Care Management Unsuccessful Follow-up Telephone Call  Date of discharge and from where:  11/17/21 from [facility]- abdominal pain  Attempts:  1st Attempt  Reason for unsuccessful TCM follow-up call:  Left voice message- left message with daughter

## 2021-11-18 NOTE — Telephone Encounter (Signed)
Left message to return call 

## 2021-11-20 ENCOUNTER — Encounter (HOSPITAL_COMMUNITY): Payer: Self-pay | Admitting: General Surgery

## 2021-11-20 ENCOUNTER — Other Ambulatory Visit: Payer: Self-pay | Admitting: Family Medicine

## 2021-11-20 DIAGNOSIS — I10 Essential (primary) hypertension: Secondary | ICD-10-CM

## 2021-11-20 NOTE — Telephone Encounter (Signed)
Patient stated Home Health PT came out today for evaluation and they are going to work with her and she sees her surgeon this week and hopes they pull her drain tube. Patient has [facility] hospital follow up scheduled 11/25/21 with Dr Lacinda Axon- Script was sent to [facility] for incontinence supplies.

## 2021-11-20 NOTE — Telephone Encounter (Signed)
Transition Care Management Follow-up Telephone Call Date of discharge and from where: D/C [facility] 11/18/21 How have you been since you were released from the hospital? Getting better after surgery Any questions or concerns? No  Items Reviewed: Did the pt receive and understand the discharge instructions provided? Yes  Medications obtained and verified?  Patient has follow up with surgeon this week Other? No  Any new allergies since your discharge? No  Dietary orders reviewed? Yes Do you have support at home? Yes   Home Care and Equipment/Supplies: Were home health services ordered? yes If so, what is the name of the agency? [facility] unsure of name  Has the agency set up a time to come to the patient's home? yes Were any new equipment or medical supplies ordered?  No What is the name of the medical supply agency? [facility] Were you able to get the supplies/equipment? not applicable Do you have any questions related to the use of the equipment or supplies? No  Functional Questionnaire: (I = Independent and D = Dependent) ADLs: D  Bathing/Dressing- D  Meal Prep- D  Eating- I  Maintaining continence- D  Transferring/Ambulation- D  Managing Meds- D  Follow up appointments reviewed:  PCP Hospital f/u appt confirmed? Yes  Scheduled to see Dr Lacinda Axon 11/25/21  Specialist Hospital f/u appt confirmed? Yes  seeing surgeon this week Are transportation arrangements needed? No  If their condition worsens, is the pt aware to call PCP or go to the Emergency Dept.? Yes Was the patient provided with contact information for the PCP's office or ED? Yes Was to pt encouraged to call back with questions or concerns? Yes

## 2021-11-21 ENCOUNTER — Encounter: Payer: Self-pay | Admitting: General Surgery

## 2021-11-21 ENCOUNTER — Ambulatory Visit (INDEPENDENT_AMBULATORY_CARE_PROVIDER_SITE_OTHER): Payer: Medicare Other | Admitting: General Surgery

## 2021-11-21 VITALS — BP 149/55 | HR 80 | Temp 98.4°F | Resp 16 | Ht 64.5 in | Wt 320.0 lb

## 2021-11-21 DIAGNOSIS — K8020 Calculus of gallbladder without cholecystitis without obstruction: Secondary | ICD-10-CM

## 2021-11-21 NOTE — Patient Instructions (Signed)
Steristrips will peel off in the next 5-7 days. You can remove them once they are peeling off. It is ok to shower. Pat the area dry.  Activity and diet as tolerated. Will call with lab with results.

## 2021-11-21 NOTE — Progress Notes (Signed)
[facility]  Doing well. JP with SS output and only 2.5cc-5 cc recorded. Doing ok but feeling run down today after getting transportation. Working with PT at home.   Diet going well. Having Bms.   BP (!) 149/55   Pulse 80   Temp 98.4 F (36.9 C) (Oral)   Resp 16   Ht 5' 4.5" (1.638 m)   Wt (!) 320 lb (145.2 kg)   SpO2 95%   BMI 54.08 kg/m  Staples looking good, no erythema or drainage, removed and steri strips placed JP with SS output, removed, dressing placed seroma evacuated   Patient s/p Lap cholecystectomy for acute cholecystitis. Doing well.   CBC and CMP  Steristrips will peel off in the next 5-7 days. You can remove them once they are peeling off. It is ok to shower. Pat the area dry.  Activity and diet as tolerated. Will call with lab with results.   Curlene Labrum, MD [facility] [address] [place], [place] [address] [phone] (office)

## 2021-11-22 DIAGNOSIS — G4733 Obstructive sleep apnea (adult) (pediatric): Secondary | ICD-10-CM | POA: Diagnosis not present

## 2021-11-22 LAB — CBC WITH DIFFERENTIAL/PLATELET
Basophils Absolute: 0 10*3/uL (ref 0.0–0.2)
Basos: 1 %
EOS (ABSOLUTE): 0.2 10*3/uL (ref 0.0–0.4)
Eos: 4 %
Hematocrit: 33.7 % — ABNORMAL LOW (ref 34.0–46.6)
Hemoglobin: 10.8 g/dL — ABNORMAL LOW (ref 11.1–15.9)
Immature Grans (Abs): 0 10*3/uL (ref 0.0–0.1)
Immature Granulocytes: 1 %
Lymphocytes Absolute: 1.5 10*3/uL (ref 0.7–3.1)
Lymphs: 27 %
MCH: 28.2 pg (ref 26.6–33.0)
MCHC: 32 g/dL (ref 31.5–35.7)
MCV: 88 fL (ref 79–97)
Monocytes Absolute: 0.4 10*3/uL (ref 0.1–0.9)
Monocytes: 7 %
Neutrophils Absolute: 3.4 10*3/uL (ref 1.4–7.0)
Neutrophils: 60 %
Platelets: 301 10*3/uL (ref 150–450)
RBC: 3.83 x10E6/uL (ref 3.77–5.28)
RDW: 16.2 % — ABNORMAL HIGH (ref 11.7–15.4)
WBC: 5.6 10*3/uL (ref 3.4–10.8)

## 2021-11-22 LAB — COMPREHENSIVE METABOLIC PANEL
ALT: 63 IU/L — ABNORMAL HIGH (ref 0–32)
AST: 22 IU/L (ref 0–40)
Albumin/Globulin Ratio: 1.2 (ref 1.2–2.2)
Albumin: 3.7 g/dL — ABNORMAL LOW (ref 3.9–4.9)
Alkaline Phosphatase: 124 IU/L — ABNORMAL HIGH (ref 44–121)
BUN/Creatinine Ratio: 11 — ABNORMAL LOW (ref 12–28)
BUN: 10 mg/dL (ref 8–27)
Bilirubin Total: 0.3 mg/dL (ref 0.0–1.2)
CO2: 27 mmol/L (ref 20–29)
Calcium: 9.1 mg/dL (ref 8.7–10.3)
Chloride: 103 mmol/L (ref 96–106)
Creatinine, Ser: 0.89 mg/dL (ref 0.57–1.00)
Globulin, Total: 3.1 g/dL (ref 1.5–4.5)
Glucose: 265 mg/dL — ABNORMAL HIGH (ref 70–99)
Potassium: 4.7 mmol/L (ref 3.5–5.2)
Sodium: 144 mmol/L (ref 134–144)
Total Protein: 6.8 g/dL (ref 6.0–8.5)
eGFR: 71 mL/min/{1.73_m2} (ref >59)

## 2021-11-25 ENCOUNTER — Ambulatory Visit: Payer: Medicare Other | Admitting: Family Medicine

## 2021-11-25 ENCOUNTER — Encounter: Payer: Self-pay | Admitting: Family Medicine

## 2021-11-25 ENCOUNTER — Telehealth: Payer: Self-pay | Admitting: *Deleted

## 2021-11-25 VITALS — BP 139/83 | HR 65 | Temp 98.5°F

## 2021-11-25 DIAGNOSIS — K8020 Calculus of gallbladder without cholecystitis without obstruction: Secondary | ICD-10-CM | POA: Diagnosis not present

## 2021-11-25 DIAGNOSIS — I4892 Unspecified atrial flutter: Secondary | ICD-10-CM

## 2021-11-25 DIAGNOSIS — Z8679 Personal history of other diseases of the circulatory system: Secondary | ICD-10-CM

## 2021-11-25 DIAGNOSIS — N179 Acute kidney failure, unspecified: Secondary | ICD-10-CM

## 2021-11-25 MED ORDER — APIXABAN 5 MG PO TABS: 5.0000 mg | ORAL_TABLET | Freq: Two times a day (BID) | ORAL | 5 refills | Status: DC

## 2021-11-25 NOTE — Assessment & Plan Note (Signed)
Patient is stable at this time.  Continue Eliquis.  Refilled today.

## 2021-11-25 NOTE — Patient Instructions (Signed)
Continue with physical therapy.  Contact info for [facility] = P: [phone]  Follow up in 3 months.   Take care  Dr. Lacinda Axon

## 2021-11-25 NOTE — Progress Notes (Signed)
Subjective:  Patient ID: [name], female    DOB: [birth year]  Age: 66 y.o. MRN: [id number]  CC: Chief Complaint  Patient presents with   Hospitalization Follow-up    Pt arrives for hospital follow up. Pt had gallbladder removed . No issues at this time. Has been exercising right knee; daughter would like to discuss FMLA     HPI:  65 year old female with an extensive past medical history presents for hospital follow-up.  Patient recently admitted to the hospital from 10/28 to 11/4.  Hospital course, labs, H&P, progress notes, and discharge summary reviewed.  In summary: Patient presented with abdominal pain and elevated LFTs.  Known cholelithiasis.  There was no choledocholithiasis on CT or ultrasound.  Patient underwent cholecystectomy and required a longer stay than anticipated due to elevated creatinine levels requiring IV hydration.  This resolved and patient was discharged home.  She has since seen her surgeon for follow-up.  She is doing well regarding her recent hospitalization.  Patient still struggling with mobility and functional status due to underlying morbid obesity and osteoarthritis.  She is currently continuing home health physical therapy.  Pain is well controlled at this time.  No nausea or vomiting.  No diarrhea.  She does note some abdominal soreness.  Patient Active Problem List   Diagnosis Date Noted   Gallstones    Knee pain 11/10/2021   Osteoarthritis of right knee 11/07/2021   Morbid obesity ([place]) 11/06/2021   Transaminitis 09/08/2021   H/O atrial flutter 09/01/2021   Diabetic neuropathy ([place]) 04/30/2021   Healthcare maintenance 10/31/2020   Depression 05/24/2015   Obstructive sleep apnea 04/04/2014   Mitral regurgitation 11/08/2013   AKI (acute kidney injury) ([place]) 05/08/2013   Hypothyroidism 05/04/2013   Malignant neoplasm of upper-outer quadrant of right breast in female, estrogen receptor positive ([place]) 01/31/2013   Asthma 12/08/2010    Hypertension    Type 2 diabetes mellitus with hyperlipidemia (HCC)    Tobacco abuse, in remission     Social Hx   Social History   Socioeconomic History   Marital status: Divorced    Spouse name: Not on file   Number of children: 2   Years of education: 12   Highest education level: 12th grade  Occupational History   Occupation: Disability  Tobacco Use   Smoking status: Former    Packs/day: 0.25    Years: 20.00    Total pack years: 5.00    Types: Cigarettes    Quit date: 02/10/1991    Years since quitting: 30.8    Passive exposure: Past   Smokeless tobacco: Never  Vaping Use   Vaping Use: Never used  Substance and Sexual Activity   Alcohol use: No   Drug use: No   Sexual activity: Not Currently    Partners: Male    Birth control/protection: Surgical  Other Topics Concern   Not on file  Social History Narrative   Lives in [place] Determinants of Health   Financial Resource Strain: Low Risk  (10/28/2021)   Overall Financial Resource Strain (CARDIA)    Difficulty of Paying Living Expenses: Not hard at all  Food Insecurity: No Food Insecurity (11/10/2021)   Hunger Vital Sign    Worried About Running Out of Food in the Last Year: Never true    Ran Out of Food in the Last Year: Never true  Transportation Needs: No Transportation Needs (11/10/2021)   PRAPARE - Transportation  Lack of Transportation (Medical): No    Lack of Transportation (Non-Medical): No  Physical Activity: Inactive (10/28/2021)   Exercise Vital Sign    Days of Exercise per Week: 0 days    Minutes of Exercise per Session: 0 min  Stress: No Stress Concern Present (10/28/2021)   [place]    Feeling of Stress : Not at all  Social Connections: Moderately Integrated (10/28/2021)   Social Connection and Isolation Panel [NHANES]    Frequency of Communication with Friends and Family: More than three times a week     Frequency of Social Gatherings with Friends and Family: More than three times a week    Attends Religious Services: More than 4 times per year    Active Member of Clubs or Organizations: Yes    Attends Archivist Meetings: 1 to 4 times per year    Marital Status: Divorced    Review of Systems Per HPI  Objective:  BP 139/83   Pulse 65   Temp 98.5 F (36.9 C)   SpO2 96%      11/25/2021    2:25 PM 11/21/2021    2:15 PM 11/16/2021    8:15 PM  BP/Weight  Systolic BP 644 034 742  Diastolic BP 83 55 72  Wt. (Lbs)  320   BMI  54.08 kg/m2     Physical Exam Vitals and nursing note reviewed.  Constitutional:      General: She is not in acute distress.    Appearance: She is obese.  HENT:     Head: Normocephalic and atraumatic.  Eyes:     General:        Right eye: No discharge.        Left eye: No discharge.     Conjunctiva/sclera: Conjunctivae normal.  Cardiovascular:     Rate and Rhythm: Normal rate and regular rhythm.  Pulmonary:     Effort: Pulmonary effort is normal.     Breath sounds: No wheezing, rhonchi or rales.  Abdominal:     Palpations: Abdomen is soft.     Tenderness: There is no abdominal tenderness.  Neurological:     Mental Status: She is alert.  Psychiatric:        Mood and Affect: Mood normal.        Behavior: Behavior normal.     Lab Results  Component Value Date   WBC 5.6 11/21/2021   HGB 10.8 (L) 11/21/2021   HCT 33.7 (L) 11/21/2021   PLT 301 11/21/2021   GLUCOSE 265 (H) 11/21/2021   CHOL 144 11/06/2021   TRIG 70 11/06/2021   HDL 66 11/06/2021   LDLCALC 64 11/06/2021   ALT 63 (H) 11/21/2021   AST 22 11/21/2021   NA 144 11/21/2021   K 4.7 11/21/2021   CL 103 11/21/2021   CREATININE 0.89 11/21/2021   BUN 10 11/21/2021   CO2 27 11/21/2021   TSH 1.330 05/30/2021   INR 1.6 (H) 09/09/2021   HGBA1C 7.7 (H) 11/06/2021     Assessment & Plan:   Problem List Items Addressed This Visit       Digestive   Gallstones - Primary     Patient presented with transaminitis in the setting of cholelithiasis.  Underwent cholecystectomy.  Did well.  Patient has follow-up with her surgeon.  She is doing as well as can be expected.  Healing well.  Continue home physical therapy.  Information given regarding her referral for  weight loss surgery.        Genitourinary   AKI (acute kidney injury) ([place])    Now resolved.  Recent labs reviewed.        Other   H/O atrial flutter    Patient is stable at this time.  Continue Eliquis.  Refilled today.      Other Visit Diagnoses     Atrial flutter, unspecified type ([place])       Relevant Medications   apixaban (ELIQUIS) 5 MG TABS tablet       Meds ordered this encounter  Medications   apixaban (ELIQUIS) 5 MG TABS tablet    Sig: Take 1 tablet (5 mg total) by mouth 2 (two) times daily.    Dispense:  60 tablet    Refill:  5    Follow-up:  Return in about 3 months (around 02/25/2022).  [place]

## 2021-11-25 NOTE — Assessment & Plan Note (Signed)
Now resolved.  Recent labs reviewed.

## 2021-11-25 NOTE — Telephone Encounter (Signed)
Lab results reviewed by Dr. Constance Haw: liver test headed in right direction, alk phos still up a little, pcp can repeat in another 2-3 months once things have calmed down   Call placed to patient and patient made aware.   Routed results to PCP.

## 2021-11-25 NOTE — Assessment & Plan Note (Signed)
Patient presented with transaminitis in the setting of cholelithiasis.  Underwent cholecystectomy.  Did well.  Patient has follow-up with her surgeon.  She is doing as well as can be expected.  Healing well.  Continue home physical therapy.  Information given regarding her referral for weight loss surgery.

## 2021-11-26 ENCOUNTER — Other Ambulatory Visit: Payer: Self-pay | Admitting: Family Medicine

## 2021-11-26 DIAGNOSIS — Z0000 Encounter for general adult medical examination without abnormal findings: Secondary | ICD-10-CM

## 2021-11-27 ENCOUNTER — Ambulatory Visit: Payer: Self-pay

## 2021-11-27 ENCOUNTER — Telehealth: Payer: Self-pay | Admitting: *Deleted

## 2021-11-27 NOTE — Patient Outreach (Signed)
  Care Coordination   Follow Up Visit Note   11/27/2021 Name: [name] MRN: [id number] DOB: [birth year]  [name] is a 66 y.o. year old female who sees Coral Spikes, DO for primary care. I spoke with  [name] by phone today.  What matters to the patients health and wellness today?  I need resources for incontinence supplies.    Goals Addressed             This Visit's Progress    COMPLETED: Care Coordination Activities       Care Coordination Interventions: Discussed the patient is unable to toilet due to needing a hoyer lift to leave the bed - patient is currently wearing depends and is having difficulty affording Provided the patient with contact to [employer] in [facility] that also provides incontinent supplies Determined the patients daughter will be able to go pick up supplies if they are available for the patient Reviewed the patient has Medicaid MQB that does not cover the costs of incontinent supplies  Educated the patient that if she begins to incur large amounts of medical bills she can submit them to DSS to determine if she qualifies for full community Medicaid which would cover incontinent products Discussed the patient owes [place] from an inpatient stay over the summer when she stayed longer than her health plan covered - patient reports she was not aware a [place] was issued Encouraged the patient to contact her health plan regarding past SNF coverage dates         SDOH assessments and interventions completed:  No     Care Coordination Interventions Activated:  Yes  Care Coordination Interventions:  Yes, provided   Follow up plan: No further intervention required.   Encounter Outcome:  Pt. Visit Completed   Daneen Schick, BSW, CDP Social Worker, Certified Dementia Practitioner [facility]  Care Coordination [phone]

## 2021-11-27 NOTE — Progress Notes (Signed)
  Care Coordination   Note   11/27/2021 Name: DAWNYA GRAMS MRN: [id number] DOB: June 22, 1955  ANNALIESA BLANN is a 66 y.o. year old female who sees Coral Spikes, DO for primary care. I reached out to [name] by phone today to offer care coordination services.  Ms. Munce was given information about Care Coordination services today including:   The Care Coordination services include support from the care team which includes your Nurse Coordinator, Clinical Social Worker, or Pharmacist.  The Care Coordination team is here to help remove barriers to the health concerns and goals most important to you. Care Coordination services are voluntary, and the patient may decline or stop services at any time by request to their care team member.   Care Coordination Consent Status: Patient agreed to services and verbal consent obtained.   Follow up plan:  Telephone appointment with care coordination team member scheduled for:  11/27/21  Encounter Outcome:  Pt. Scheduled  [place]  Direct Dial: [phone]

## 2021-11-27 NOTE — Patient Instructions (Signed)
Visit Information  Thank you for taking time to visit with me today. Please don't hesitate to contact me if I can be of assistance to you.   Following are the goals we discussed today:   Goals Addressed             This Visit's Progress    COMPLETED: Care Coordination Activities       Care Coordination Interventions: Discussed the patient is unable to toilet due to needing a hoyer lift to leave the bed - patient is currently wearing depends and is having difficulty affording Provided the patient with contact to [employer] in [facility] that also provides incontinent supplies Determined the patients daughter will be able to go pick up supplies if they are available for the patient Reviewed the patient has Medicaid MQB that does not cover the costs of incontinent supplies  Educated the patient that if she begins to incur large amounts of medical bills she can submit them to DSS to determine if she qualifies for full community Medicaid which would cover incontinent products Discussed the patient owes [place] from an inpatient stay over the summer when she stayed longer than her health plan covered - patient reports she was not aware a [place] was issued Encouraged the patient to contact her health plan regarding past SNF coverage dates         If you are experiencing a Mental Health or [place] or need someone to talk to, please call the [facility]: [phone]  Patient verbalizes understanding of instructions and care plan provided today and agrees to view in [place]. Active MyChart status and patient understanding of how to access instructions and care plan via MyChart confirmed with patient.     No further follow up required: Please contact the [place] at [phone].   Daneen Schick, BSW, CDP Social Worker, Certified Dementia Practitioner [place] [facility]  Care Coordination [phone]

## 2021-12-02 ENCOUNTER — Ambulatory Visit: Payer: Self-pay | Admitting: Family Medicine

## 2021-12-07 DIAGNOSIS — G4733 Obstructive sleep apnea (adult) (pediatric): Secondary | ICD-10-CM | POA: Diagnosis not present

## 2021-12-07 DIAGNOSIS — R262 Difficulty in walking, not elsewhere classified: Secondary | ICD-10-CM | POA: Diagnosis not present

## 2021-12-07 DIAGNOSIS — M6281 Muscle weakness (generalized): Secondary | ICD-10-CM | POA: Diagnosis not present

## 2021-12-08 DIAGNOSIS — E890 Postprocedural hypothyroidism: Secondary | ICD-10-CM | POA: Diagnosis not present

## 2021-12-08 DIAGNOSIS — K760 Fatty (change of) liver, not elsewhere classified: Secondary | ICD-10-CM | POA: Diagnosis not present

## 2021-12-08 DIAGNOSIS — E0500 Thyrotoxicosis with diffuse goiter without thyrotoxic crisis or storm: Secondary | ICD-10-CM | POA: Diagnosis not present

## 2021-12-08 DIAGNOSIS — J4520 Mild intermittent asthma, uncomplicated: Secondary | ICD-10-CM | POA: Diagnosis not present

## 2021-12-08 DIAGNOSIS — N1831 Chronic kidney disease, stage 3a: Secondary | ICD-10-CM | POA: Diagnosis not present

## 2021-12-08 DIAGNOSIS — Z48815 Encounter for surgical aftercare following surgery on the digestive system: Secondary | ICD-10-CM | POA: Diagnosis not present

## 2021-12-08 DIAGNOSIS — G4733 Obstructive sleep apnea (adult) (pediatric): Secondary | ICD-10-CM | POA: Diagnosis not present

## 2021-12-08 DIAGNOSIS — E1169 Type 2 diabetes mellitus with other specified complication: Secondary | ICD-10-CM | POA: Diagnosis not present

## 2021-12-08 DIAGNOSIS — E785 Hyperlipidemia, unspecified: Secondary | ICD-10-CM | POA: Diagnosis not present

## 2021-12-08 DIAGNOSIS — I150 Renovascular hypertension: Secondary | ICD-10-CM | POA: Diagnosis not present

## 2021-12-08 DIAGNOSIS — I4892 Unspecified atrial flutter: Secondary | ICD-10-CM | POA: Diagnosis not present

## 2021-12-08 DIAGNOSIS — E1122 Type 2 diabetes mellitus with diabetic chronic kidney disease: Secondary | ICD-10-CM | POA: Diagnosis not present

## 2021-12-08 DIAGNOSIS — M1711 Unilateral primary osteoarthritis, right knee: Secondary | ICD-10-CM | POA: Diagnosis not present

## 2021-12-08 DIAGNOSIS — R7401 Elevation of levels of liver transaminase levels: Secondary | ICD-10-CM | POA: Diagnosis not present

## 2021-12-08 DIAGNOSIS — I4891 Unspecified atrial fibrillation: Secondary | ICD-10-CM | POA: Diagnosis not present

## 2021-12-09 ENCOUNTER — Other Ambulatory Visit: Payer: Self-pay | Admitting: Family Medicine

## 2021-12-20 ENCOUNTER — Other Ambulatory Visit: Payer: Self-pay

## 2021-12-20 ENCOUNTER — Telehealth: Payer: Self-pay | Admitting: Family Medicine

## 2021-12-20 NOTE — Telephone Encounter (Signed)
Patient is requesting a referral to [facility] to [facility] orthopedic Specialist. The number is [phone] , please advise

## 2021-12-20 NOTE — Telephone Encounter (Signed)
Patient is requesting a referral to [facility] to [facility] orthopedic Specialist. The number is [phone]

## 2021-12-22 DIAGNOSIS — G4733 Obstructive sleep apnea (adult) (pediatric): Secondary | ICD-10-CM | POA: Diagnosis not present

## 2021-12-23 ENCOUNTER — Other Ambulatory Visit: Payer: Self-pay

## 2021-12-23 DIAGNOSIS — I4892 Unspecified atrial flutter: Secondary | ICD-10-CM

## 2021-12-23 DIAGNOSIS — Z48815 Encounter for surgical aftercare following surgery on the digestive system: Secondary | ICD-10-CM | POA: Diagnosis not present

## 2021-12-23 DIAGNOSIS — I150 Renovascular hypertension: Secondary | ICD-10-CM

## 2021-12-23 DIAGNOSIS — M1711 Unilateral primary osteoarthritis, right knee: Secondary | ICD-10-CM | POA: Diagnosis not present

## 2021-12-23 DIAGNOSIS — M129 Arthropathy, unspecified: Secondary | ICD-10-CM

## 2021-12-23 DIAGNOSIS — N1831 Chronic kidney disease, stage 3a: Secondary | ICD-10-CM | POA: Diagnosis not present

## 2021-12-23 DIAGNOSIS — E1122 Type 2 diabetes mellitus with diabetic chronic kidney disease: Secondary | ICD-10-CM | POA: Diagnosis not present

## 2021-12-23 DIAGNOSIS — I4891 Unspecified atrial fibrillation: Secondary | ICD-10-CM

## 2022-01-02 DIAGNOSIS — L089 Local infection of the skin and subcutaneous tissue, unspecified: Secondary | ICD-10-CM | POA: Diagnosis not present

## 2022-01-08 ENCOUNTER — Other Ambulatory Visit: Payer: Self-pay

## 2022-01-08 DIAGNOSIS — I10 Essential (primary) hypertension: Secondary | ICD-10-CM

## 2022-01-08 MED ORDER — AMLODIPINE BESYLATE 10 MG PO TABS: 10.0000 mg | ORAL_TABLET | Freq: Every day | ORAL | 1 refills | Status: DC

## 2022-01-23 DIAGNOSIS — Z96652 Presence of left artificial knee joint: Secondary | ICD-10-CM | POA: Diagnosis not present

## 2022-01-23 DIAGNOSIS — M25561 Pain in right knee: Secondary | ICD-10-CM | POA: Diagnosis not present

## 2022-01-23 DIAGNOSIS — M25462 Effusion, left knee: Secondary | ICD-10-CM | POA: Diagnosis not present

## 2022-01-23 DIAGNOSIS — T8484XA Pain due to internal orthopedic prosthetic devices, implants and grafts, initial encounter: Secondary | ICD-10-CM | POA: Diagnosis not present

## 2022-01-23 DIAGNOSIS — G8929 Other chronic pain: Secondary | ICD-10-CM | POA: Diagnosis not present

## 2022-01-23 DIAGNOSIS — M1711 Unilateral primary osteoarthritis, right knee: Secondary | ICD-10-CM | POA: Diagnosis not present

## 2022-01-23 DIAGNOSIS — X58XXXA Exposure to other specified factors, initial encounter: Secondary | ICD-10-CM | POA: Diagnosis not present

## 2022-01-23 DIAGNOSIS — M7651 Patellar tendinitis, right knee: Secondary | ICD-10-CM | POA: Diagnosis not present

## 2022-01-23 DIAGNOSIS — M25562 Pain in left knee: Secondary | ICD-10-CM | POA: Diagnosis not present

## 2022-01-24 DIAGNOSIS — R062 Wheezing: Secondary | ICD-10-CM | POA: Diagnosis not present

## 2022-01-24 DIAGNOSIS — G4733 Obstructive sleep apnea (adult) (pediatric): Secondary | ICD-10-CM | POA: Diagnosis not present

## 2022-01-25 ENCOUNTER — Other Ambulatory Visit: Payer: Self-pay | Admitting: Family Medicine

## 2022-01-30 ENCOUNTER — Other Ambulatory Visit: Payer: Self-pay | Admitting: Family Medicine

## 2022-01-30 DIAGNOSIS — M129 Arthropathy, unspecified: Secondary | ICD-10-CM

## 2022-01-31 ENCOUNTER — Other Ambulatory Visit: Payer: Self-pay | Admitting: Family Medicine

## 2022-01-31 DIAGNOSIS — M129 Arthropathy, unspecified: Secondary | ICD-10-CM

## 2022-01-31 MED ORDER — TRAMADOL HCL 50 MG PO TABS: 50.0000 mg | ORAL_TABLET | Freq: Three times a day (TID) | ORAL | 0 refills | Status: DC | PRN

## 2022-02-05 DIAGNOSIS — X58XXXD Exposure to other specified factors, subsequent encounter: Secondary | ICD-10-CM | POA: Diagnosis not present

## 2022-02-05 DIAGNOSIS — M1711 Unilateral primary osteoarthritis, right knee: Secondary | ICD-10-CM | POA: Diagnosis not present

## 2022-02-05 DIAGNOSIS — T8482XD Fibrosis due to internal orthopedic prosthetic devices, implants and grafts, subsequent encounter: Secondary | ICD-10-CM | POA: Diagnosis not present

## 2022-02-05 DIAGNOSIS — M5441 Lumbago with sciatica, right side: Secondary | ICD-10-CM | POA: Diagnosis not present

## 2022-02-05 DIAGNOSIS — M47817 Spondylosis without myelopathy or radiculopathy, lumbosacral region: Secondary | ICD-10-CM | POA: Diagnosis not present

## 2022-02-05 DIAGNOSIS — G8929 Other chronic pain: Secondary | ICD-10-CM | POA: Diagnosis not present

## 2022-02-05 DIAGNOSIS — T8484XD Pain due to internal orthopedic prosthetic devices, implants and grafts, subsequent encounter: Secondary | ICD-10-CM | POA: Diagnosis not present

## 2022-02-05 DIAGNOSIS — Z96652 Presence of left artificial knee joint: Secondary | ICD-10-CM | POA: Diagnosis not present

## 2022-02-06 DIAGNOSIS — R262 Difficulty in walking, not elsewhere classified: Secondary | ICD-10-CM | POA: Diagnosis not present

## 2022-02-24 ENCOUNTER — Ambulatory Visit (INDEPENDENT_AMBULATORY_CARE_PROVIDER_SITE_OTHER): Payer: Medicare Other | Admitting: Family Medicine

## 2022-02-24 DIAGNOSIS — I10 Essential (primary) hypertension: Secondary | ICD-10-CM

## 2022-02-24 DIAGNOSIS — R062 Wheezing: Secondary | ICD-10-CM | POA: Diagnosis not present

## 2022-02-24 DIAGNOSIS — G4733 Obstructive sleep apnea (adult) (pediatric): Secondary | ICD-10-CM | POA: Diagnosis not present

## 2022-02-24 MED ORDER — CARVEDILOL 12.5 MG PO TABS: 12.5000 mg | ORAL_TABLET | Freq: Two times a day (BID) | ORAL | 1 refills | Status: DC

## 2022-02-24 MED ORDER — ONETOUCH ULTRA VI STRP: ORAL_STRIP | 1 refills | Status: DC

## 2022-02-24 NOTE — Assessment & Plan Note (Signed)
BP mildly elevated today.  Continue current medications.  Will continue to monitor.  Carvedilol refilled.  Needs weight loss.

## 2022-02-24 NOTE — Patient Instructions (Signed)
Call [facility] and schedule an appt.  Consider switch to Ozempic.  Follow up in 3 months.

## 2022-02-24 NOTE — Progress Notes (Signed)
Subjective:  Patient ID: [name], female    DOB: 1955-11-07  Age: 67 y.o. MRN: [id number]  CC: Chief Complaint  Patient presents with   Diabetes   Hypertension    HPI:  67 year old female with an extensive past medical history presents for follow-up.  Patient continues to struggle with back pain and bilateral knee pain.  She is able to stand but cannot ambulate.  She is in a wheelchair still.  She has been seen by orthopedics and is now going to see pain management.  Patient never went through with the referral to bariatric surgery.  Will discuss today.  Patient needs drastic weight loss so that she can improve her quality of life and hopefully get mobile again.  BP mildly elevated here today.  She is on amlodipine, benazepril, and carvedilol.  Needs refill on carvedilol.  Patient Active Problem List   Diagnosis Date Noted   Osteoarthritis of right knee 11/07/2021   Morbid obesity ([place]) 11/06/2021   H/O atrial flutter 09/01/2021   Diabetic neuropathy ([place]) 04/30/2021   Healthcare maintenance 10/31/2020   Depression 05/24/2015   Obstructive sleep apnea 04/04/2014   Mitral regurgitation 11/08/2013   Hypothyroidism 05/04/2013   Malignant neoplasm of upper-outer quadrant of right breast in female, estrogen receptor positive ([place]) 01/31/2013   Asthma 12/08/2010   Hypertension    Type 2 diabetes mellitus with hyperlipidemia (HCC)    Tobacco abuse, in remission     Social Hx   Social History   Socioeconomic History   Marital status: Divorced    Spouse name: Not on file   Number of children: 2   Years of education: 12   Highest education level: 12th grade  Occupational History   Occupation: Disability  Tobacco Use   Smoking status: Former    Packs/day: 0.25    Years: 20.00    Total pack years: 5.00    Types: Cigarettes    Quit date: 02/10/1991    Years since quitting: 31.0    Passive exposure: Past   Smokeless tobacco: Never  Vaping Use   Vaping Use:  Never used  Substance and Sexual Activity   Alcohol use: No   Drug use: No   Sexual activity: Not Currently    Partners: Male    Birth control/protection: Surgical  Other Topics Concern   Not on file  Social History Narrative   Lives in [place]         Social Determinants of Health   Financial Resource Strain: Low Risk  (10/28/2021)   Overall Financial Resource Strain (CARDIA)    Difficulty of Paying Living Expenses: Not hard at all  Food Insecurity: No Food Insecurity (11/10/2021)   Hunger Vital Sign    Worried About Running Out of Food in the Last Year: Never true    Ran Out of Food in the Last Year: Never true  Transportation Needs: No Transportation Needs (11/10/2021)   PRAPARE - Hydrologist (Medical): No    Lack of Transportation (Non-Medical): No  Physical Activity: Inactive (10/28/2021)   Exercise Vital Sign    Days of Exercise per Week: 0 days    Minutes of Exercise per Session: 0 min  Stress: No Stress Concern Present (10/28/2021)   [place]    Feeling of Stress : Not at all  Social Connections: Moderately Integrated (10/28/2021)   Social Connection and Isolation Panel [NHANES]    Frequency of Communication  with Friends and Family: More than three times a week    Frequency of Social Gatherings with Friends and Family: More than three times a week    Attends Religious Services: More than 4 times per year    Active Member of [employer] or Organizations: Yes    Attends Archivist Meetings: 1 to 4 times per year    Marital Status: Divorced    Review of Systems  Constitutional:  Positive for activity change.  Musculoskeletal:  Positive for arthralgias and back pain.    Objective:  BP (!) 142/82   Pulse 85   Temp 97.9 F (36.6 C)   Ht 5' 4.5" (1.638 m)   Wt (!) 321 lb (145.6 kg)   SpO2 97%   BMI 54.25 kg/m      02/24/2022    1:12 PM 11/25/2021     2:25 PM 11/21/2021    2:15 PM  BP/Weight  Systolic BP [id number]  Diastolic BP 82 83 55  Wt. (Lbs) 321  320  BMI 54.25 kg/m2  54.08 kg/m2    Physical Exam Constitutional:      General: She is not in acute distress.    Appearance: Normal appearance. She is obese.  HENT:     Head: Normocephalic and atraumatic.  Cardiovascular:     Rate and Rhythm: Normal rate and regular rhythm.  Pulmonary:     Effort: Pulmonary effort is normal.     Breath sounds: Normal breath sounds. No wheezing, rhonchi or rales.  Neurological:     Mental Status: She is alert.  Psychiatric:        Mood and Affect: Mood normal.        Behavior: Behavior normal.     Lab Results  Component Value Date   WBC 5.6 11/21/2021   HGB 10.8 (L) 11/21/2021   HCT 33.7 (L) 11/21/2021   PLT 301 11/21/2021   GLUCOSE 265 (H) 11/21/2021   CHOL 144 11/06/2021   TRIG 70 11/06/2021   HDL 66 11/06/2021   LDLCALC 64 11/06/2021   ALT 63 (H) 11/21/2021   AST 22 11/21/2021   NA 144 11/21/2021   K 4.7 11/21/2021   CL 103 11/21/2021   CREATININE 0.89 11/21/2021   BUN 10 11/21/2021   CO2 27 11/21/2021   TSH 1.330 05/30/2021   INR 1.6 (H) 09/09/2021   HGBA1C 7.7 (H) 11/06/2021     Assessment & Plan:   Problem List Items Addressed This Visit       Cardiovascular and Mediastinum   Hypertension - Primary    BP mildly elevated today.  Continue current medications.  Will continue to monitor.  Carvedilol refilled.  Needs weight loss.      Relevant Medications   carvedilol (COREG) 12.5 MG tablet     Other   Morbid obesity ([place])    We had a lengthy discussion today.  Patient has reservations regarding bariatric surgery.  I advised her that I think this is her best option to help get her mobile again.  Patient would like to complete a week.  I advised her to contact [facility].  Referral has already been placed.       Meds ordered this encounter  Medications   glucose blood (ONETOUCH ULTRA) test  strip    Sig: USE TO TEST BLOOD SUGAR 4 TIMES DAILY AS DIRECTED.    Dispense:  100 strip    Refill:  1   carvedilol (COREG) 12.5 MG tablet  Sig: Take 1 tablet (12.5 mg total) by mouth 2 (two) times daily with a meal.    Dispense:  180 tablet    Refill:  1    Follow-up:  Return in about 3 months (around 05/25/2022).  [place]

## 2022-02-24 NOTE — Assessment & Plan Note (Signed)
We had a lengthy discussion today.  Patient has reservations regarding bariatric surgery.  I advised her that I think this is her best option to help get her mobile again.  Patient would like to complete a week.  I advised her to contact [facility].  Referral has already been placed.

## 2022-02-28 ENCOUNTER — Other Ambulatory Visit: Payer: Self-pay | Admitting: Family Medicine

## 2022-02-28 DIAGNOSIS — M129 Arthropathy, unspecified: Secondary | ICD-10-CM

## 2022-03-06 DIAGNOSIS — T8484XD Pain due to internal orthopedic prosthetic devices, implants and grafts, subsequent encounter: Secondary | ICD-10-CM | POA: Diagnosis not present

## 2022-03-06 DIAGNOSIS — M1711 Unilateral primary osteoarthritis, right knee: Secondary | ICD-10-CM | POA: Diagnosis not present

## 2022-03-09 DIAGNOSIS — R262 Difficulty in walking, not elsewhere classified: Secondary | ICD-10-CM | POA: Diagnosis not present

## 2022-03-09 DIAGNOSIS — G4733 Obstructive sleep apnea (adult) (pediatric): Secondary | ICD-10-CM | POA: Diagnosis not present

## 2022-03-09 DIAGNOSIS — M6281 Muscle weakness (generalized): Secondary | ICD-10-CM | POA: Diagnosis not present

## 2022-03-14 ENCOUNTER — Other Ambulatory Visit: Payer: Self-pay | Admitting: Family Medicine

## 2022-03-20 ENCOUNTER — Other Ambulatory Visit: Payer: Self-pay | Admitting: Family Medicine

## 2022-03-20 DIAGNOSIS — I10 Essential (primary) hypertension: Secondary | ICD-10-CM

## 2022-03-25 DIAGNOSIS — R062 Wheezing: Secondary | ICD-10-CM | POA: Diagnosis not present

## 2022-03-25 DIAGNOSIS — G4733 Obstructive sleep apnea (adult) (pediatric): Secondary | ICD-10-CM | POA: Diagnosis not present

## 2022-03-31 ENCOUNTER — Other Ambulatory Visit: Payer: Self-pay | Admitting: Family Medicine

## 2022-03-31 DIAGNOSIS — E7800 Pure hypercholesterolemia, unspecified: Secondary | ICD-10-CM | POA: Diagnosis not present

## 2022-03-31 DIAGNOSIS — M129 Arthropathy, unspecified: Secondary | ICD-10-CM

## 2022-03-31 DIAGNOSIS — E890 Postprocedural hypothyroidism: Secondary | ICD-10-CM | POA: Diagnosis not present

## 2022-03-31 DIAGNOSIS — I10 Essential (primary) hypertension: Secondary | ICD-10-CM | POA: Diagnosis not present

## 2022-03-31 DIAGNOSIS — E1165 Type 2 diabetes mellitus with hyperglycemia: Secondary | ICD-10-CM | POA: Diagnosis not present

## 2022-04-03 ENCOUNTER — Other Ambulatory Visit: Payer: Self-pay | Admitting: Family Medicine

## 2022-04-03 DIAGNOSIS — M129 Arthropathy, unspecified: Secondary | ICD-10-CM

## 2022-04-04 ENCOUNTER — Telehealth: Payer: Self-pay

## 2022-04-04 NOTE — Telephone Encounter (Signed)
Patient called and wanted to know why her Tramadol refill was refused?

## 2022-04-07 DIAGNOSIS — M6281 Muscle weakness (generalized): Secondary | ICD-10-CM | POA: Diagnosis not present

## 2022-04-07 DIAGNOSIS — G4733 Obstructive sleep apnea (adult) (pediatric): Secondary | ICD-10-CM | POA: Diagnosis not present

## 2022-04-07 DIAGNOSIS — R262 Difficulty in walking, not elsewhere classified: Secondary | ICD-10-CM | POA: Diagnosis not present

## 2022-04-11 ENCOUNTER — Ambulatory Visit: Payer: Medicare Other | Admitting: Orthopedic Surgery

## 2022-04-25 DIAGNOSIS — G4733 Obstructive sleep apnea (adult) (pediatric): Secondary | ICD-10-CM | POA: Diagnosis not present

## 2022-04-25 DIAGNOSIS — R062 Wheezing: Secondary | ICD-10-CM | POA: Diagnosis not present

## 2022-04-30 DIAGNOSIS — M1711 Unilateral primary osteoarthritis, right knee: Secondary | ICD-10-CM | POA: Diagnosis not present

## 2022-05-05 ENCOUNTER — Other Ambulatory Visit: Payer: Self-pay | Admitting: Family Medicine

## 2022-05-05 DIAGNOSIS — M129 Arthropathy, unspecified: Secondary | ICD-10-CM

## 2022-05-07 ENCOUNTER — Telehealth: Payer: Self-pay | Admitting: Family Medicine

## 2022-05-07 NOTE — Telephone Encounter (Signed)
Refill on    traMADol (ULTRAM) 50 MG tablet  [facility] last filled  04/01/22

## 2022-05-08 DIAGNOSIS — R262 Difficulty in walking, not elsewhere classified: Secondary | ICD-10-CM | POA: Diagnosis not present

## 2022-05-08 DIAGNOSIS — G4733 Obstructive sleep apnea (adult) (pediatric): Secondary | ICD-10-CM | POA: Diagnosis not present

## 2022-05-08 DIAGNOSIS — M6281 Muscle weakness (generalized): Secondary | ICD-10-CM | POA: Diagnosis not present

## 2022-05-16 ENCOUNTER — Ambulatory Visit (INDEPENDENT_AMBULATORY_CARE_PROVIDER_SITE_OTHER): Payer: Medicare Other

## 2022-05-16 VITALS — Ht 64.5 in | Wt 321.0 lb

## 2022-05-16 DIAGNOSIS — Z0000 Encounter for general adult medical examination without abnormal findings: Secondary | ICD-10-CM

## 2022-05-16 NOTE — Progress Notes (Signed)
Subjective:   [name] is a 67 y.o. female who presents for Medicare Annual (Subsequent) preventive examination.  I connected with  Kathyrn Sheriff on 05/16/22 by a audio enabled telemedicine application and verified that I am speaking with the correct person using two identifiers.  Patient Location: Home  Provider Location: Office/Clinic  I discussed the limitations of evaluation and management by telemedicine. The patient expressed understanding and agreed to proceed.  Review of Systems     Cardiac Risk Factors include: advanced age (>55men, >87 women);diabetes mellitus;dyslipidemia;hypertension;sedentary lifestyle;obesity (BMI >30kg/m2)     Objective:    Today's Vitals   05/16/22 1423  Weight: (!) 321 lb (145.6 kg)  Height: 5' 4.5" (1.638 m)   Body mass index is 54.25 kg/m.     05/16/2022    2:33 PM 11/13/2021    9:26 AM 11/09/2021    8:12 PM 10/28/2021   11:55 AM 09/01/2021   12:27 AM 08/31/2021    9:10 AM 04/30/2021    3:16 PM  Advanced Directives  Does Patient Have a Medical Advance Directive? No No No No  No No  Would patient like information on creating a medical advance directive? No - Patient declined No - Patient declined No - Patient declined No - Patient declined No - Patient declined  No - Patient declined    Current Medications (verified) Outpatient Encounter Medications as of 05/16/2022  Medication Sig   albuterol (PROVENTIL) (2.5 MG/3ML) 0.083% nebulizer solution USE 1 VIAL IN NEBULIZER EVERY 6 HOURS AS NEEDED FOR WHEEZING OR SHORTNESS OF BREATH. (Patient taking differently: Take 2.5 mg by nebulization every 6 (six) hours as needed for wheezing or shortness of breath.)   albuterol (VENTOLIN HFA) 108 (90 Base) MCG/ACT inhaler INHALE 2 PUFFS INTO THE LUNGS EVERY 6 HOURS AS NEEDED FOR SHORTNESS OF BREATH OR WHEEZING. (Patient taking differently: Inhale 2 puffs into the lungs every 6 (six) hours as needed for wheezing or shortness of breath.)    amLODipine (NORVASC) 10 MG tablet Take 1 tablet (10 mg total) by mouth daily.   apixaban (ELIQUIS) 5 MG TABS tablet Take 1 tablet (5 mg total) by mouth 2 (two) times daily.   benazepril (LOTENSIN) 40 MG tablet TAKE (1) TABLET BY MOUTH ONCE DAILY.   carvedilol (COREG) 12.5 MG tablet Take 1 tablet (12.5 mg total) by mouth 2 (two) times daily with a meal.   Diclofenac Sodium 3 % GEL SMARTSIG:2-4 Gram(s) Topical Twice Daily   DULoxetine (CYMBALTA) 60 MG capsule Take 1 capsule by mouth daily.   fluticasone (FLOVENT HFA) 44 MCG/ACT inhaler Inhale 2 puffs into the lungs 2 (two) times daily.   glucose blood (ONETOUCH ULTRA) test strip USE TO TEST BLOOD SUGAR 4 TIMES DAILY AS DIRECTED.   HUMALOG MIX 75/25 KWIKPEN (75-25) 100 UNIT/ML Kwikpen Inject 12 Units into the skin in the morning and at bedtime.   Incontinence Supply Disposable (DEPEND UNDERWEAR X-LARGE) MISC Use daily as needed.   Insulin Pen Needle 31G X 8 MM MISC Use to inject insulin one time daily   levothyroxine (SYNTHROID) 100 MCG tablet Take 100 mcg by mouth daily before breakfast.   polyethylene glycol (MIRALAX / GLYCOLAX) 17 g packet Take 17 g by mouth daily as needed for mild constipation.   pravastatin (PRAVACHOL) 40 MG tablet TAKE (1) TABLET BY MOUTH AT BEDTIME.   pregabalin (LYRICA) 100 MG capsule Take 1 capsule (100 mg total) by mouth 3 (three) times daily.   traMADol (ULTRAM) 50 MG tablet  Take 1-1.5 tablets by mouth every 8 hours as needed for moderate pain or severe pain.   triamcinolone cream (KENALOG) 0.1 % APPLY TO AFFECTED AREAS TWICE DAILY.   TRULICITY 1.5 MG/0.5ML SOPN    pantoprazole (PROTONIX) 40 MG tablet Take 1 tablet (40 mg total) by mouth 2 (two) times daily before a meal.   [DISCONTINUED] diclofenac Sodium (VOLTAREN) 1 % GEL Apply 4 g topically 4 (four) times daily.   [DISCONTINUED] Dulaglutide (TRULICITY) 0.75 MG/0.5ML SOPN Inject 0.75 mg into the skin once a week.   [DISCONTINUED] pregabalin (LYRICA) 200 MG capsule  Take 1 capsule (200 mg total) by mouth 3 (three) times daily.   No facility-administered encounter medications on file as of 05/16/2022.    Allergies (verified) Procardia [nifedipine], Aspirin, and Diltiazem   History: Past Medical History:  Diagnosis Date   Asthma    prn neb. and inhaler   Breast cancer (HCC) 01/13/2013   right   Dehydration 04/13/2013   GERD (gastroesophageal reflux disease)    Graves' disease    Hyperlipidemia    Hypertension    on multiple meds., has been on med. > 14 yr.   Non-insulin dependent type 2 diabetes mellitus (HCC)    Obesity    Ophthalmic manifestation of Graves disease    Osteoarthritis 01/13/2001   left knee   Radiation 10/11/13-11/29/13   Right Breast   Sleep apnea 04/14/2014   mod  , wears cpap 10 , ramps from 4 , nasal mask   Wears glasses    Past Surgical History:  Procedure Laterality Date   CARDIOVERSION N/A 11/08/2013   Procedure: CARDIOVERSION;  Surgeon: Lewayne Bunting, MD;  Location: [facility] ENDOSCOPY;  Service: Cardiovascular;  Laterality: N/A;   CHOLECYSTECTOMY N/A 11/13/2021   Procedure: LAPAROSCOPIC CHOLECYSTECTOMY;  Surgeon: Lucretia Roers, MD;  Location: [facility] ORS;  Service: General;  Laterality: N/A;   COLONOSCOPY  2007   COLONOSCOPY W/ POLYPECTOMY  2009   EXCISION OF KELOID N/A 05/18/2014   Procedure: EXCISION OF KELOID;  Surgeon: Harriette Bouillon, MD;  Location: [facility];  Service: General;  Laterality: N/A;   PORT-A-CATH REMOVAL Right 05/18/2014   Procedure: REMOVAL PORT-A-CATH;  Surgeon: Harriette Bouillon, MD;  Location: [facility];  Service: General;  Laterality: Right;   PORTACATH PLACEMENT Right 02/17/2013   Procedure: INSERTION PORT-A-CATH;  Surgeon: Clovis Pu. Cornett, MD;  Location: [facility];  Service: General;  Laterality: Right;   TEE WITHOUT CARDIOVERSION N/A 11/08/2013   Procedure: TRANSESOPHAGEAL ECHOCARDIOGRAM (TEE);  Surgeon: Lewayne Bunting, MD;  Location: [facility] ENDOSCOPY;   Service: Cardiovascular;  Laterality: N/A;   TOTAL KNEE ARTHROPLASTY Left 2003   TOTAL THYROIDECTOMY     TUBAL LIGATION  1985   Family History  Problem Relation Age of Onset   Diabetes Mother    Heart disease Father    Lung cancer Father    Diabetes Sister    Hypertension Sister    Asthma Sister    Rheum arthritis Daughter    Hypertension Daughter    Fibromyalgia Daughter    Hypertension Daughter    Social History   Socioeconomic History   Marital status: Divorced    Spouse name: Not on file   Number of children: 2   Years of education: 12   Highest education level: 12th grade  Occupational History   Occupation: Disability  Tobacco Use   Smoking status: Former    Packs/day: 0.25    Years: 20.00  Additional pack years: 0.00    Total pack years: 5.00    Types: Cigarettes    Quit date: 02/10/1991    Years since quitting: 31.2    Passive exposure: Past   Smokeless tobacco: Never  Vaping Use   Vaping Use: Never used  Substance and Sexual Activity   Alcohol use: No   Drug use: No   Sexual activity: Not Currently    Partners: Male    Birth control/protection: Surgical  Other Topics Concern   Not on file  Social History Narrative   Lives in [place] with 2 daughters    Social Determinants of Health   Financial Resource Strain: Low Risk  (05/16/2022)   Overall Financial Resource Strain (CARDIA)    Difficulty of Paying Living Expenses: Not hard at all  Food Insecurity: No Food Insecurity (05/16/2022)   Hunger Vital Sign    Worried About Running Out of Food in the Last Year: Never true    Ran Out of Food in the Last Year: Never true  Transportation Needs: No Transportation Needs (05/16/2022)   PRAPARE - Administrator, Civil Service (Medical): No    Lack of Transportation (Non-Medical): No  Physical Activity: Inactive (05/16/2022)   Exercise Vital Sign    Days of Exercise per Week: 0 days    Minutes of Exercise per Session: 0 min  Stress: No Stress  Concern Present (05/16/2022)   [employer] of Occupational Health - Occupational Stress Questionnaire    Feeling of Stress : Not at all  Social Connections: Moderately Isolated (05/16/2022)   Social Connection and Isolation Panel [NHANES]    Frequency of Communication with Friends and Family: More than three times a week    Frequency of Social Gatherings with Friends and Family: More than three times a week    Attends Religious Services: More than 4 times per year    Active Member of [employer] or Organizations: No    Attends Engineer, structural: Never    Marital Status: Divorced    Tobacco Counseling Counseling given: Not Answered   Clinical Intake:  Pre-visit preparation completed: Yes  Pain : No/denies pain     Diabetes: Yes CBG done?: No Did pt. bring in CBG monitor from home?: No  How often do you need to have someone help you when you read instructions, pamphlets, or other written materials from your doctor or pharmacy?: 1 - Never  Diabetic?Yes   Nutrition Risk Assessment:  Has the patient had any N/V/D within the last 2 months?  No  Does the patient have any non-healing wounds?  No  Has the patient had any unintentional weight loss or weight gain?  No   Diabetes:  Is the patient diabetic?  Yes  If diabetic, was a CBG obtained today?  No  Did the patient bring in their glucometer from home?  No  How often do you monitor your CBG's? CBG; dexcom.   Financial Strains and Diabetes Management:  Are you having any financial strains with the device, your supplies or your medication? No .  Does the patient want to be seen by Chronic Care Management for management of their diabetes?  No  Would the patient like to be referred to a Nutritionist or for Diabetic Management?  No   Diabetic Exams:  Diabetic Eye Exam: Overdue for diabetic eye exam. Pt has been advised about the importance in completing this exam. Patient advised to call and schedule an eye  exam. Diabetic Foot  Exam: Overdue, Pt has been advised about the importance in completing this exam. Pt is scheduled for diabetic foot exam on at next office visit.   Interpreter Needed?: No  Information entered by :: Kandis Fantasia LPN   Activities of Daily Living    05/16/2022    2:32 PM 11/13/2021    9:28 AM  In your present state of health, do you have any difficulty performing the following activities:  Hearing? 0 0  Vision? 0   Difficulty concentrating or making decisions? 0 0  Walking or climbing stairs? 1 0  Dressing or bathing? 0 0  Doing errands, shopping? 1   Preparing Food and eating ? N   Using the Toilet? N   In the past six months, have you accidently leaked urine? N   Do you have problems with loss of bowel control? N   Managing your Medications? N   Managing your Finances? N   Housekeeping or managing your Housekeeping? Y     Patient Care Team: Tommie Sams, DO as PCP - General (Family Medicine) Ollen Gross, MD (Orthopedic Surgery) Dorisann Frames, MD as Referring Physician (Endocrinology) Hetty Ely Corliss Parish, MD as Referring Physician (Anesthesiology) Theda Sers (Orthopedic Surgery)  Indicate any recent Medical Services you may have received from other than [facility] providers in the past year (date may be approximate).     Assessment:   This is a routine wellness examination for [facility].  Hearing/Vision screen Hearing Screening - Comments:: Denies hearing difficulties   Vision Screening - Comments:: No vision problems; will schedule routine/diabetic eye exam soon    Dietary issues and exercise activities discussed: Current Exercise Habits: The patient does not participate in regular exercise at present   Goals Addressed             This Visit's Progress    Regain ability to walk        Depression Screen    05/16/2022    2:33 PM 02/24/2022    1:18 PM 11/25/2021    2:25 PM 11/06/2021   11:33 AM 10/28/2021   11:51 AM  04/30/2021    3:12 PM 05/15/2020   10:13 AM  PHQ 2/9 Scores  PHQ - 2 Score 0 0 0 0 0 0 0  PHQ- 9 Score  0         Fall Risk    05/16/2022    2:32 PM 02/24/2022    1:18 PM 11/25/2021    2:25 PM 11/06/2021   11:32 AM 10/28/2021   11:53 AM  Fall Risk   Falls in the past year? 0 0 0 0 1  Number falls in past yr: 0 0 0 0 1  Injury with Fall? 0 0 0 0 1  Risk for fall due to : Impaired mobility No Fall Risks No Fall Risks No Fall Risks History of fall(s);Impaired balance/gait;Impaired mobility;Medication side effect  Follow up Falls prevention discussed;Education provided;Falls evaluation completed Falls evaluation completed Falls evaluation completed Falls evaluation completed Falls evaluation completed;Education provided;Falls prevention discussed    FALL RISK PREVENTION PERTAINING TO THE HOME:  Any stairs in or around the home? No  If so, are there any without handrails? No  Home free of loose throw rugs in walkways, pet beds, electrical cords, etc? Yes  Adequate lighting in your home to reduce risk of falls? Yes   ASSISTIVE DEVICES UTILIZED TO PREVENT FALLS:  Life alert? No  Use of a cane, walker or w/c? Yes  Grab bars  in the bathroom? Yes  Shower chair or bench in shower? Yes  Elevated toilet seat or a handicapped toilet? Yes   TIMED UP AND GO:  Was the test performed? No . Telephonic visit   Cognitive Function:        05/16/2022    2:32 PM 04/30/2021    3:19 PM  6CIT Screen  What Year? 0 points 0 points  What month? 0 points 0 points  What time? 0 points 0 points  Count back from 20 0 points 0 points  Months in reverse 0 points 4 points  Repeat phrase 0 points 4 points  Total Score 0 points 8 points    Immunizations Immunization History  Administered Date(s) Administered   Fluad Quad(high Dose 65+) 11/15/2019, 10/31/2020, 11/06/2021   Influenza Split 09/28/2012   Influenza,inj,Quad PF,6+ Mos 11/01/2013, 10/25/2014, 11/22/2015, 11/19/2016, 12/03/2017, 11/20/2018    Pneumococcal Polysaccharide-23 12/03/2017   Pneumococcal-Unspecified 12/12/2004    TDAP status: Due, Education has been provided regarding the importance of this vaccine. Advised may receive this vaccine at local pharmacy or Health Dept. Aware to provide a copy of the vaccination record if obtained from local pharmacy or Health Dept. Verbalized acceptance and understanding.  Flu Vaccine status: Up to date  Pneumococcal vaccine status: Up to date  Covid-19 vaccine status: Information provided on how to obtain vaccines.   Qualifies for Shingles Vaccine? Yes   Zostavax completed No   Shingrix Completed?: No.    Education has been provided regarding the importance of this vaccine. Patient has been advised to call insurance company to determine out of pocket expense if they have not yet received this vaccine. Advised may also receive vaccine at local pharmacy or Health Dept. Verbalized acceptance and understanding.  Screening Tests Health Maintenance  Topic Date Due   COVID-19 Vaccine (1) Never done   DTaP/Tdap/Td (1 - Tdap) Never done   Zoster Vaccines- Shingrix (1 of 2) Never done   FOOT EXAM  06/03/2018   HEMOGLOBIN A1C  05/08/2022   Diabetic kidney evaluation - Urine ACR  05/31/2022   OPHTHALMOLOGY EXAM  05/16/2022 (Originally 08/06/1965)   MAMMOGRAM  05/16/2023 (Originally 06/04/2018)   COLONOSCOPY (Pts 45-55yrs Insurance coverage will need to be confirmed)  05/16/2023 (Originally 08/07/1999)   INFLUENZA VACCINE  08/14/2022   Diabetic kidney evaluation - eGFR measurement  11/22/2022   Medicare Annual Wellness (AWV)  05/16/2023   DEXA SCAN  Completed   Hepatitis C Screening  Completed   HPV VACCINES  Aged Out   Pneumonia Vaccine 53+ Years old  Discontinued    Health Maintenance  Health Maintenance Due  Topic Date Due   COVID-19 Vaccine (1) Never done   DTaP/Tdap/Td (1 - Tdap) Never done   Zoster Vaccines- Shingrix (1 of 2) Never done   FOOT EXAM  06/03/2018   HEMOGLOBIN  A1C  05/08/2022   Diabetic kidney evaluation - Urine ACR  05/31/2022    Colorectal cancer screening:  Patient declines   Mammogram status: Patient declines   Bone Density status: Completed 01/17/14. Results reflect: Bone density results: NORMAL. Repeat every 5 years.  Lung Cancer Screening: (Low Dose CT Chest recommended if Age 11-80 years, 30 pack-year currently smoking OR have quit w/in 15years.) does not qualify.   Lung Cancer Screening Referral: n/a  Additional Screening:  Hepatitis C Screening: does qualify; Completed 11/09/21  Vision Screening: Recommended annual ophthalmology exams for early detection of glaucoma and other disorders of the eye. Is the patient up to date with  their annual eye exam?  No  Who is the provider or what is the name of the office in which the patient attends annual eye exams? none If pt is not established with a provider, would they like to be referred to a provider to establish care? No .   Dental Screening: Recommended annual dental exams for proper oral hygiene  Community Resource Referral / Chronic Care Management: CRR required this visit?  No   CCM required this visit?  No      Plan:     I have personally reviewed and noted the following in the patient's chart:   Medical and social history Use of alcohol, tobacco or illicit drugs  Current medications and supplements including opioid prescriptions. Patient is currently taking opioid prescriptions. Information provided to patient regarding non-opioid alternatives. Patient advised to discuss non-opioid treatment plan with their provider. Functional ability and status Nutritional status Physical activity Advanced directives List of other physicians Hospitalizations, surgeries, and ER visits in previous 12 months Vitals Screenings to include cognitive, depression, and falls Referrals and appointments  In addition, I have reviewed and discussed with patient certain preventive protocols,  quality metrics, and best practice recommendations. A written personalized care plan for preventive services as well as general preventive health recommendations were provided to patient.     Durwin Nora, [place]   01/18/1094   Due to this being a virtual visit, the after visit summary with patients personalized plan was offered to patient via mail or my-chart.  Patient would like to access on my-chart  Nurse Notes: No concerns

## 2022-05-16 NOTE — Patient Instructions (Signed)
[name] , Thank you for taking time to come for your Medicare Wellness Visit. I appreciate your ongoing commitment to your health goals. Please review the following plan we discussed and let me know if I can assist you in the future.   These are the goals we discussed:  Goals      Exercise 3x per week (30 min per time)     Try chair exercises as tolerated. Have knee repaired.      Regain ability to walk        This is a list of the screening recommended for you and due dates:  Health Maintenance  Topic Date Due   COVID-19 Vaccine (1) Never done   DTaP/Tdap/Td vaccine (1 - Tdap) Never done   Zoster (Shingles) Vaccine (1 of 2) Never done   Complete foot exam   06/03/2018   Hemoglobin A1C  05/08/2022   Yearly kidney health urinalysis for diabetes  05/31/2022   Eye exam for diabetics  05/16/2022*   Mammogram  05/16/2023*   Colon Cancer Screening  05/16/2023*   Flu Shot  08/14/2022   Yearly kidney function blood test for diabetes  11/22/2022   Medicare Annual Wellness Visit  05/16/2023   DEXA scan (bone density measurement)  Completed   Hepatitis C Screening: USPSTF Recommendation to screen - Ages 75-79 yo.  Completed   HPV Vaccine  Aged Out   Pneumonia Vaccine  Discontinued  *Topic was postponed. The date shown is not the original due date.    Advanced directives: Forms are available if you choose in the future to pursue completion.  This is recommended in order to make sure that your health wishes are honored in the event that you are unable to verbalize them to the provider.    Conditions/risks identified: Aim for 30 minutes of exercise or brisk walking, 6-8 glasses of water, and 5 servings of fruits and vegetables each day.   Next appointment: Follow up in one year for your annual wellness visit    Preventive Care 65 Years and Older, Female Preventive care refers to lifestyle choices and visits with your health care provider that can promote health and wellness. What  does preventive care include? A yearly physical exam. This is also called an annual well check. Dental exams once or twice a year. Routine eye exams. Ask your health care provider how often you should have your eyes checked. Personal lifestyle choices, including: Daily care of your teeth and gums. Regular physical activity. Eating a healthy diet. Avoiding tobacco and drug use. Limiting alcohol use. Practicing safe sex. Taking low-dose aspirin every day. Taking vitamin and mineral supplements as recommended by your health care provider. What happens during an annual well check? The services and screenings done by your health care provider during your annual well check will depend on your age, overall health, lifestyle risk factors, and family history of disease. Counseling  Your health care provider may ask you questions about your: Alcohol use. Tobacco use. Drug use. Emotional well-being. Home and relationship well-being. Sexual activity. Eating habits. History of falls. Memory and ability to understand (cognition). Work and work Astronomer. Reproductive health. Screening  You may have the following tests or measurements: Height, weight, and BMI. Blood pressure. Lipid and cholesterol levels. These may be checked every 5 years, or more frequently if you are over 94 years old. Skin check. Lung cancer screening. You may have this screening every year starting at age 29 if you have a 30-pack-year history  of smoking and currently smoke or have quit within the past 15 years. Fecal occult blood test (FOBT) of the stool. You may have this test every year starting at age 56. Flexible sigmoidoscopy or colonoscopy. You may have a sigmoidoscopy every 5 years or a colonoscopy every 10 years starting at age 63. Hepatitis C blood test. Hepatitis B blood test. Sexually transmitted disease (STD) testing. Diabetes screening. This is done by checking your blood sugar (glucose) after you have  not eaten for a while (fasting). You may have this done every 1-3 years. Bone density scan. This is done to screen for osteoporosis. You may have this done starting at age 39. Mammogram. This may be done every 1-2 years. Talk to your health care provider about how often you should have regular mammograms. Talk with your health care provider about your test results, treatment options, and if necessary, the need for more tests. Vaccines  Your health care provider may recommend certain vaccines, such as: Influenza vaccine. This is recommended every year. Tetanus, diphtheria, and acellular pertussis (Tdap, Td) vaccine. You may need a Td booster every 10 years. Zoster vaccine. You may need this after age 47. Pneumococcal 13-valent conjugate (PCV13) vaccine. One dose is recommended after age 33. Pneumococcal polysaccharide (PPSV23) vaccine. One dose is recommended after age 8. Talk to your health care provider about which screenings and vaccines you need and how often you need them. This information is not intended to replace advice given to you by your health care provider. Make sure you discuss any questions you have with your health care provider. Document Released: 01/26/2015 Document Revised: 09/19/2015 Document Reviewed: 10/31/2014 Elsevier Interactive Patient Education  2017 [place] Prevention in the Home Falls can cause injuries. They can happen to people of all ages. There are many things you can do to make your home safe and to help prevent falls. What can I do on the outside of my home? Regularly fix the edges of walkways and driveways and fix any cracks. Remove anything that might make you trip as you walk through a door, such as a raised step or threshold. Trim any bushes or trees on the path to your home. Use bright outdoor lighting. Clear any walking paths of anything that might make someone trip, such as rocks or tools. Regularly check to see if handrails are loose or  broken. Make sure that both sides of any steps have handrails. Any raised decks and porches should have guardrails on the edges. Have any leaves, snow, or ice cleared regularly. Use sand or salt on walking paths during winter. Clean up any spills in your garage right away. This includes oil or grease spills. What can I do in the bathroom? Use night lights. Install grab bars by the toilet and in the tub and shower. Do not use towel bars as grab bars. Use non-skid mats or decals in the tub or shower. If you need to sit down in the shower, use a plastic, non-slip stool. Keep the floor dry. Clean up any water that spills on the floor as soon as it happens. Remove soap buildup in the tub or shower regularly. Attach bath mats securely with double-sided non-slip rug tape. Do not have throw rugs and other things on the floor that can make you trip. What can I do in the bedroom? Use night lights. Make sure that you have a light by your bed that is easy to reach. Do not use any sheets or blankets  that are too big for your bed. They should not hang down onto the floor. Have a firm chair that has side arms. You can use this for support while you get dressed. Do not have throw rugs and other things on the floor that can make you trip. What can I do in the kitchen? Clean up any spills right away. Avoid walking on wet floors. Keep items that you use a lot in easy-to-reach places. If you need to reach something above you, use a strong step stool that has a grab bar. Keep electrical cords out of the way. Do not use floor polish or wax that makes floors slippery. If you must use wax, use non-skid floor wax. Do not have throw rugs and other things on the floor that can make you trip. What can I do with my stairs? Do not leave any items on the stairs. Make sure that there are handrails on both sides of the stairs and use them. Fix handrails that are broken or loose. Make sure that handrails are as long as  the stairways. Check any carpeting to make sure that it is firmly attached to the stairs. Fix any carpet that is loose or worn. Avoid having throw rugs at the top or bottom of the stairs. If you do have throw rugs, attach them to the floor with carpet tape. Make sure that you have a light switch at the top of the stairs and the bottom of the stairs. If you do not have them, ask someone to add them for you. What else can I do to help prevent falls? Wear shoes that: Do not have high heels. Have rubber bottoms. Are comfortable and fit you well. Are closed at the toe. Do not wear sandals. If you use a stepladder: Make sure that it is fully opened. Do not climb a closed stepladder. Make sure that both sides of the stepladder are locked into place. Ask someone to hold it for you, if possible. Clearly mark and make sure that you can see: Any grab bars or handrails. First and last steps. Where the edge of each step is. Use tools that help you move around (mobility aids) if they are needed. These include: Canes. Walkers. Scooters. Crutches. Turn on the lights when you go into a dark area. Replace any light bulbs as soon as they burn out. Set up your furniture so you have a clear path. Avoid moving your furniture around. If any of your floors are uneven, fix them. If there are any pets around you, be aware of where they are. Review your medicines with your doctor. Some medicines can make you feel dizzy. This can increase your chance of falling. Ask your doctor what other things that you can do to help prevent falls. This information is not intended to replace advice given to you by your health care provider. Make sure you discuss any questions you have with your health care provider. Document Released: 10/26/2008 Document Revised: 06/07/2015 Document Reviewed: 02/03/2014 Elsevier Interactive Patient Education  2017 [employer].

## 2022-05-20 ENCOUNTER — Other Ambulatory Visit: Payer: Self-pay | Admitting: Family Medicine

## 2022-05-20 DIAGNOSIS — G4733 Obstructive sleep apnea (adult) (pediatric): Secondary | ICD-10-CM | POA: Diagnosis not present

## 2022-05-25 DIAGNOSIS — G4733 Obstructive sleep apnea (adult) (pediatric): Secondary | ICD-10-CM | POA: Diagnosis not present

## 2022-05-25 DIAGNOSIS — R062 Wheezing: Secondary | ICD-10-CM | POA: Diagnosis not present

## 2022-05-26 ENCOUNTER — Ambulatory Visit (INDEPENDENT_AMBULATORY_CARE_PROVIDER_SITE_OTHER): Payer: Medicare Other | Admitting: Family Medicine

## 2022-05-26 VITALS — BP 175/79 | HR 74 | Temp 97.7°F | Ht 64.5 in

## 2022-05-26 DIAGNOSIS — E785 Hyperlipidemia, unspecified: Secondary | ICD-10-CM

## 2022-05-26 DIAGNOSIS — Z7984 Long term (current) use of oral hypoglycemic drugs: Secondary | ICD-10-CM

## 2022-05-26 DIAGNOSIS — D649 Anemia, unspecified: Secondary | ICD-10-CM

## 2022-05-26 DIAGNOSIS — I10 Essential (primary) hypertension: Secondary | ICD-10-CM

## 2022-05-26 DIAGNOSIS — M1711 Unilateral primary osteoarthritis, right knee: Secondary | ICD-10-CM

## 2022-05-26 DIAGNOSIS — E1169 Type 2 diabetes mellitus with other specified complication: Secondary | ICD-10-CM

## 2022-05-26 DIAGNOSIS — I4892 Unspecified atrial flutter: Secondary | ICD-10-CM | POA: Diagnosis not present

## 2022-05-26 MED ORDER — APIXABAN 5 MG PO TABS: 5.0000 mg | ORAL_TABLET | Freq: Two times a day (BID) | ORAL | 3 refills | Status: DC

## 2022-05-26 MED ORDER — TRIAMCINOLONE ACETONIDE 0.1 % EX CREA: TOPICAL_CREAM | CUTANEOUS | 3 refills | Status: DC

## 2022-05-26 NOTE — Patient Instructions (Signed)
Labs at next visit.  Follow up in 6 months.  Take care   Dr. Adriana Simas

## 2022-05-27 LAB — MICROALBUMIN / CREATININE URINE RATIO

## 2022-05-27 MED ORDER — TRULICITY 3 MG/0.5ML ~~LOC~~ SOAJ: 3.0000 mg | SUBCUTANEOUS | 1 refills | Status: DC

## 2022-05-27 NOTE — Assessment & Plan Note (Signed)
Home readings well-controlled.  Continue current medications.

## 2022-05-27 NOTE — Assessment & Plan Note (Signed)
Stable.  I have talked with her endocrinologist.  I am increasing her Trulicity with her permission to continue to aid her blood sugars as well as promote weight loss.

## 2022-05-27 NOTE — Assessment & Plan Note (Signed)
Pain is better controlled on Cymbalta.  Continue.

## 2022-05-27 NOTE — Progress Notes (Signed)
Subjective:  Patient ID: [name], female    DOB: [birth year]  Age: 67 y.o. MRN: [id number]  CC: Chief Complaint  Patient presents with   Hypertension    Follow up     HPI:  67 year old female presents for follow-up.  Blood pressure significantly elevated today.  Her home readings are well-controlled.  Patient remains nonambulatory.  Her pain has improved after seeing pain management.  However, she remains unable to ambulate.  She can stand.  Patient's type 2 diabetes is managed by endocrinology.  She is currently on Trulicity.  Will discuss with her recommendations to increase to help with weight loss.   Patient Active Problem List   Diagnosis Date Noted   Osteoarthritis of right knee 11/07/2021   Morbid obesity (HCC) 11/06/2021   H/O atrial flutter 09/01/2021   Diabetic neuropathy (HCC) 04/30/2021   Healthcare maintenance 10/31/2020   Depression 05/24/2015   Obstructive sleep apnea 04/04/2014   Mitral regurgitation 11/08/2013   Hypothyroidism 05/04/2013   Malignant neoplasm of upper-outer quadrant of right breast in female, estrogen receptor positive (HCC) 01/31/2013   Asthma 12/08/2010   Hypertension    Type 2 diabetes mellitus with hyperlipidemia (HCC)    Tobacco abuse, in remission     Social Hx   Social History   Socioeconomic History   Marital status: Divorced    Spouse name: Not on file   Number of children: 2   Years of education: 12   Highest education level: 12th grade  Occupational History   Occupation: Disability  Tobacco Use   Smoking status: Former    Packs/day: 0.25    Years: 20.00    Additional pack years: 0.00    Total pack years: 5.00    Types: Cigarettes    Quit date: 02/10/1991    Years since quitting: 31.3    Passive exposure: Past   Smokeless tobacco: Never  Vaping Use   Vaping Use: Never used  Substance and Sexual Activity   Alcohol use: No   Drug use: No   Sexual activity: Not Currently    Partners: Male    Birth  control/protection: Surgical  Other Topics Concern   Not on file  Social History Narrative   Lives in [place] with 2 daughters    Social Determinants of Health   Financial Resource Strain: Low Risk  (05/16/2022)   Overall Financial Resource Strain (CARDIA)    Difficulty of Paying Living Expenses: Not hard at all  Food Insecurity: No Food Insecurity (05/16/2022)   Hunger Vital Sign    Worried About Running Out of Food in the Last Year: Never true    Ran Out of Food in the Last Year: Never true  Transportation Needs: No Transportation Needs (05/16/2022)   PRAPARE - Administrator, Civil Service (Medical): No    Lack of Transportation (Non-Medical): No  Physical Activity: Inactive (05/16/2022)   Exercise Vital Sign    Days of Exercise per Week: 0 days    Minutes of Exercise per Session: 0 min  Stress: No Stress Concern Present (05/16/2022)   [employer] of Occupational Health - Occupational Stress Questionnaire    Feeling of Stress : Not at all  Social Connections: Moderately Isolated (05/16/2022)   Social Connection and Isolation Panel [NHANES]    Frequency of Communication with Friends and Family: More than three times a week    Frequency of Social Gatherings with Friends and Family: More than three times a week  Attends Religious Services: More than 4 times per year    Active Member of Clubs or Organizations: No    Attends Banker Meetings: Never    Marital Status: Divorced    Review of Systems Per HPI  Objective:  BP (!) 175/79   Pulse 74   Temp 97.7 F (36.5 C)   Ht 5' 4.5" (1.638 m)   SpO2 96%   BMI 54.25 kg/m      05/26/2022    1:09 PM 05/16/2022    2:23 PM 02/24/2022    1:12 PM  BP/Weight  Systolic BP 175  161  Diastolic BP 79  82  Wt. (Lbs)  321 321  BMI  54.25 kg/m2 54.25 kg/m2    Physical Exam Vitals and nursing note reviewed.  Constitutional:      Appearance: Normal appearance. She is obese.  Eyes:     General:         Right eye: No discharge.        Left eye: No discharge.     Conjunctiva/sclera: Conjunctivae normal.  Cardiovascular:     Rate and Rhythm: Normal rate and regular rhythm.  Pulmonary:     Effort: Pulmonary effort is normal.     Breath sounds: Normal breath sounds. No wheezing or rales.  Neurological:     Mental Status: She is alert.  Psychiatric:        Mood and Affect: Mood normal.        Behavior: Behavior normal.     Lab Results  Component Value Date   WBC 4.8 05/26/2022   HGB 12.1 05/26/2022   HCT 37.8 05/26/2022   PLT 228 05/26/2022   GLUCOSE 156 (H) 05/26/2022   CHOL 150 05/26/2022   TRIG 97 05/26/2022   HDL 63 05/26/2022   LDLCALC 69 05/26/2022   ALT 21 05/26/2022   AST 22 05/26/2022   NA 142 05/26/2022   K 4.1 05/26/2022   CL 101 05/26/2022   CREATININE 0.80 05/26/2022   BUN 12 05/26/2022   CO2 28 05/26/2022   TSH 1.330 05/30/2021   INR 1.6 (H) 09/09/2021   HGBA1C 7.7 (H) 11/06/2021     Assessment & Plan:   Problem List Items Addressed This Visit       Cardiovascular and Mediastinum   Hypertension - Primary    Home readings well-controlled.  Continue current medications.      Relevant Medications   apixaban (ELIQUIS) 5 MG TABS tablet   Other Relevant Orders   CMP14+EGFR (Completed)     Endocrine   Type 2 diabetes mellitus with hyperlipidemia (HCC)    Stable.  I have talked with her endocrinologist.  I am increasing her Trulicity with her permission to continue to aid her blood sugars as well as promote weight loss.      Relevant Medications   apixaban (ELIQUIS) 5 MG TABS tablet   Dulaglutide (TRULICITY) 3 MG/0.5ML SOPN   Other Relevant Orders   Microalbumin / creatinine urine ratio (Completed)     Musculoskeletal and Integument   Osteoarthritis of right knee    Pain is better controlled on Cymbalta.  Continue.      Other Visit Diagnoses     Atrial flutter, unspecified type (HCC)       Relevant Medications   apixaban (ELIQUIS) 5 MG  TABS tablet   Anemia, unspecified type       Relevant Orders   CBC (Completed)   Hyperlipidemia associated with type 2 diabetes mellitus (  HCC)       Relevant Medications   apixaban (ELIQUIS) 5 MG TABS tablet   Dulaglutide (TRULICITY) 3 MG/0.5ML SOPN   Other Relevant Orders   Lipid panel (Completed)       Meds ordered this encounter  Medications   apixaban (ELIQUIS) 5 MG TABS tablet    Sig: Take 1 tablet (5 mg total) by mouth 2 (two) times daily.    Dispense:  180 tablet    Refill:  3   triamcinolone cream (KENALOG) 0.1 %    Sig: APPLY TO AFFECTED AREAS TWICE DAILY.    Dispense:  60 g    Refill:  3   Dulaglutide (TRULICITY) 3 MG/0.5ML SOPN    Sig: Inject 3 mg as directed once a week.    Dispense:  3 mL    Refill:  1    Follow-up:  6 months  Morris Longenecker Adriana Simas DO [facility]

## 2022-05-29 LAB — MICROALBUMIN / CREATININE URINE RATIO

## 2022-05-30 LAB — CMP14+EGFR
ALT: 21 IU/L (ref 0–32)
AST: 22 IU/L (ref 0–40)
Albumin/Globulin Ratio: 1.4 (ref 1.2–2.2)
Albumin: 4.1 g/dL (ref 3.9–4.9)
Alkaline Phosphatase: 107 IU/L (ref 44–121)
BUN/Creatinine Ratio: 15 (ref 12–28)
BUN: 12 mg/dL (ref 8–27)
Bilirubin Total: 0.3 mg/dL (ref 0.0–1.2)
CO2: 28 mmol/L (ref 20–29)
Calcium: 9.1 mg/dL (ref 8.7–10.3)
Chloride: 101 mmol/L (ref 96–106)
Creatinine, Ser: 0.8 mg/dL (ref 0.57–1.00)
Globulin, Total: 3 g/dL (ref 1.5–4.5)
Glucose: 156 mg/dL — ABNORMAL HIGH (ref 70–99)
Potassium: 4.1 mmol/L (ref 3.5–5.2)
Sodium: 142 mmol/L (ref 134–144)
Total Protein: 7.1 g/dL (ref 6.0–8.5)
eGFR: 81 mL/min/{1.73_m2} (ref >59)

## 2022-05-30 LAB — LIPID PANEL
Chol/HDL Ratio: 2.4 ratio (ref 0.0–4.4)
Cholesterol, Total: 150 mg/dL (ref 100–199)
HDL: 63 mg/dL (ref >39)
LDL Chol Calc (NIH): 69 mg/dL (ref 0–99)
Triglycerides: 97 mg/dL (ref 0–149)
VLDL Cholesterol Cal: 18 mg/dL (ref 5–40)

## 2022-05-30 LAB — CBC
Hematocrit: 37.8 % (ref 34.0–46.6)
Hemoglobin: 12.1 g/dL (ref 11.1–15.9)
MCH: 27.9 pg (ref 26.6–33.0)
MCHC: 32 g/dL (ref 31.5–35.7)
MCV: 87 fL (ref 79–97)
Platelets: 228 10*3/uL (ref 150–450)
RBC: 4.33 x10E6/uL (ref 3.77–5.28)
RDW: 16.6 % — ABNORMAL HIGH (ref 11.7–15.4)
WBC: 4.8 10*3/uL (ref 3.4–10.8)

## 2022-05-30 LAB — SPECIMEN STATUS REPORT

## 2022-06-06 ENCOUNTER — Other Ambulatory Visit: Payer: Self-pay | Admitting: Family Medicine

## 2022-06-06 DIAGNOSIS — M129 Arthropathy, unspecified: Secondary | ICD-10-CM

## 2022-06-07 DIAGNOSIS — M6281 Muscle weakness (generalized): Secondary | ICD-10-CM | POA: Diagnosis not present

## 2022-06-07 DIAGNOSIS — R262 Difficulty in walking, not elsewhere classified: Secondary | ICD-10-CM | POA: Diagnosis not present

## 2022-06-07 DIAGNOSIS — G4733 Obstructive sleep apnea (adult) (pediatric): Secondary | ICD-10-CM | POA: Diagnosis not present

## 2022-06-12 ENCOUNTER — Other Ambulatory Visit: Payer: Self-pay | Admitting: Family Medicine

## 2022-06-12 DIAGNOSIS — J45909 Unspecified asthma, uncomplicated: Secondary | ICD-10-CM

## 2022-06-17 ENCOUNTER — Other Ambulatory Visit: Payer: Self-pay | Admitting: Family Medicine

## 2022-06-20 DIAGNOSIS — G4733 Obstructive sleep apnea (adult) (pediatric): Secondary | ICD-10-CM | POA: Diagnosis not present

## 2022-06-25 DIAGNOSIS — R062 Wheezing: Secondary | ICD-10-CM | POA: Diagnosis not present

## 2022-06-25 DIAGNOSIS — G4733 Obstructive sleep apnea (adult) (pediatric): Secondary | ICD-10-CM | POA: Diagnosis not present

## 2022-06-28 ENCOUNTER — Other Ambulatory Visit: Payer: Self-pay | Admitting: Family Medicine

## 2022-06-28 DIAGNOSIS — I10 Essential (primary) hypertension: Secondary | ICD-10-CM

## 2022-07-10 ENCOUNTER — Other Ambulatory Visit: Payer: Self-pay | Admitting: Family Medicine

## 2022-07-10 DIAGNOSIS — M129 Arthropathy, unspecified: Secondary | ICD-10-CM

## 2022-07-20 DIAGNOSIS — G4733 Obstructive sleep apnea (adult) (pediatric): Secondary | ICD-10-CM | POA: Diagnosis not present

## 2022-07-25 ENCOUNTER — Other Ambulatory Visit: Payer: Self-pay | Admitting: Family Medicine

## 2022-07-25 DIAGNOSIS — G4733 Obstructive sleep apnea (adult) (pediatric): Secondary | ICD-10-CM | POA: Diagnosis not present

## 2022-07-25 DIAGNOSIS — R062 Wheezing: Secondary | ICD-10-CM | POA: Diagnosis not present

## 2022-08-22 DIAGNOSIS — G4733 Obstructive sleep apnea (adult) (pediatric): Secondary | ICD-10-CM | POA: Diagnosis not present

## 2022-08-25 DIAGNOSIS — G4733 Obstructive sleep apnea (adult) (pediatric): Secondary | ICD-10-CM | POA: Diagnosis not present

## 2022-08-25 DIAGNOSIS — R062 Wheezing: Secondary | ICD-10-CM | POA: Diagnosis not present

## 2022-09-03 ENCOUNTER — Other Ambulatory Visit: Payer: Self-pay | Admitting: Family Medicine

## 2022-09-12 ENCOUNTER — Other Ambulatory Visit: Payer: Self-pay | Admitting: Family Medicine

## 2022-09-22 DIAGNOSIS — G4733 Obstructive sleep apnea (adult) (pediatric): Secondary | ICD-10-CM | POA: Diagnosis not present

## 2022-09-23 ENCOUNTER — Other Ambulatory Visit: Payer: Self-pay | Admitting: Family Medicine

## 2022-10-03 DIAGNOSIS — I10 Essential (primary) hypertension: Secondary | ICD-10-CM | POA: Diagnosis not present

## 2022-10-03 DIAGNOSIS — E1165 Type 2 diabetes mellitus with hyperglycemia: Secondary | ICD-10-CM | POA: Diagnosis not present

## 2022-10-03 DIAGNOSIS — E890 Postprocedural hypothyroidism: Secondary | ICD-10-CM | POA: Diagnosis not present

## 2022-10-03 DIAGNOSIS — E7800 Pure hypercholesterolemia, unspecified: Secondary | ICD-10-CM | POA: Diagnosis not present

## 2022-10-03 DIAGNOSIS — Z23 Encounter for immunization: Secondary | ICD-10-CM | POA: Diagnosis not present

## 2022-10-17 ENCOUNTER — Other Ambulatory Visit: Payer: Self-pay | Admitting: Family Medicine

## 2022-10-17 DIAGNOSIS — Z1212 Encounter for screening for malignant neoplasm of rectum: Secondary | ICD-10-CM

## 2022-10-17 DIAGNOSIS — Z1211 Encounter for screening for malignant neoplasm of colon: Secondary | ICD-10-CM

## 2022-10-22 DIAGNOSIS — G4733 Obstructive sleep apnea (adult) (pediatric): Secondary | ICD-10-CM | POA: Diagnosis not present

## 2022-10-28 DIAGNOSIS — M1711 Unilateral primary osteoarthritis, right knee: Secondary | ICD-10-CM | POA: Diagnosis not present

## 2022-10-29 ENCOUNTER — Other Ambulatory Visit: Payer: Self-pay | Admitting: Family Medicine

## 2022-10-29 DIAGNOSIS — M129 Arthropathy, unspecified: Secondary | ICD-10-CM

## 2022-11-10 DIAGNOSIS — R531 Weakness: Secondary | ICD-10-CM | POA: Diagnosis not present

## 2022-11-10 DIAGNOSIS — M25561 Pain in right knee: Secondary | ICD-10-CM | POA: Diagnosis not present

## 2022-11-10 DIAGNOSIS — M25562 Pain in left knee: Secondary | ICD-10-CM | POA: Diagnosis not present

## 2022-11-10 DIAGNOSIS — R29898 Other symptoms and signs involving the musculoskeletal system: Secondary | ICD-10-CM | POA: Diagnosis not present

## 2022-11-11 DIAGNOSIS — Z1211 Encounter for screening for malignant neoplasm of colon: Secondary | ICD-10-CM | POA: Diagnosis not present

## 2022-11-11 DIAGNOSIS — Z1212 Encounter for screening for malignant neoplasm of rectum: Secondary | ICD-10-CM | POA: Diagnosis not present

## 2022-11-20 LAB — COLOGUARD: COLOGUARD: NEGATIVE

## 2022-11-24 ENCOUNTER — Other Ambulatory Visit: Payer: Self-pay | Admitting: Family Medicine

## 2022-11-24 DIAGNOSIS — J45909 Unspecified asthma, uncomplicated: Secondary | ICD-10-CM

## 2022-11-26 ENCOUNTER — Telehealth: Payer: Medicare Other | Admitting: Family Medicine

## 2022-11-26 DIAGNOSIS — I10 Essential (primary) hypertension: Secondary | ICD-10-CM

## 2022-11-26 DIAGNOSIS — E119 Type 2 diabetes mellitus without complications: Secondary | ICD-10-CM

## 2022-11-26 DIAGNOSIS — E1169 Type 2 diabetes mellitus with other specified complication: Secondary | ICD-10-CM

## 2022-11-26 DIAGNOSIS — Z794 Long term (current) use of insulin: Secondary | ICD-10-CM

## 2022-11-26 DIAGNOSIS — M1711 Unilateral primary osteoarthritis, right knee: Secondary | ICD-10-CM

## 2022-11-26 NOTE — Progress Notes (Signed)
Virtual Visit via Video Note  I connected with [name] on 11/26/22 at 10:40 AM EST by a video enabled telemedicine application and verified that I am speaking with the correct person using two identifiers.  Location: Patient: Home Provider: Office   I discussed the limitations of evaluation and management by telemedicine and the availability of in person appointments. The patient expressed understanding and agreed to proceed.  History of Present Illness: 67 year old female presents for routine follow-up.  Patient's type 2 diabetes has improved.  Her insulin has been decreased to 6 units twice daily and she has been started on Mounjaro.  No longer on Trulicity.  Follows with endocrinology.  Blood pressure has been stable on amlodipine and benazepril.  Patient states that she is improving regarding her pain.  She is doing physical therapy.  She has been losing weight and overall she states that her pain has improved.  She is in good spirits today.    Observations/Objective: General: Well-appearing.  No acute distress. Respiratory: No increased work of breathing. Psych: Normal mood and affect.  Assessment and Plan: 67 year old female presents for follow-up.  Type 2 diabetes -improving.  Hypertension -stable.  Continue current medications.  Chronic pain -improving.  Continue current medications.  Rx is going to be faxed regarding her transfer board.  Follow Up Instructions:    I discussed the assessment and treatment plan with the patient. The patient was provided an opportunity to ask questions and all were answered. The patient agreed with the plan and demonstrated an understanding of the instructions.   The patient was advised to call back or seek an in-person evaluation if the symptoms worsen or if the condition fails to improve as anticipated.  I provided 5 minutes of non-face-to-face time during this encounter.   Tommie Sams, DO

## 2022-12-02 DIAGNOSIS — R29898 Other symptoms and signs involving the musculoskeletal system: Secondary | ICD-10-CM | POA: Diagnosis not present

## 2022-12-02 DIAGNOSIS — R531 Weakness: Secondary | ICD-10-CM | POA: Diagnosis not present

## 2022-12-02 DIAGNOSIS — M25562 Pain in left knee: Secondary | ICD-10-CM | POA: Diagnosis not present

## 2022-12-02 DIAGNOSIS — M25561 Pain in right knee: Secondary | ICD-10-CM | POA: Diagnosis not present

## 2022-12-15 DIAGNOSIS — G4733 Obstructive sleep apnea (adult) (pediatric): Secondary | ICD-10-CM | POA: Diagnosis not present

## 2022-12-25 ENCOUNTER — Other Ambulatory Visit: Payer: Self-pay | Admitting: Family Medicine

## 2022-12-25 DIAGNOSIS — I10 Essential (primary) hypertension: Secondary | ICD-10-CM

## 2023-01-15 DIAGNOSIS — G4733 Obstructive sleep apnea (adult) (pediatric): Secondary | ICD-10-CM | POA: Diagnosis not present

## 2023-01-22 ENCOUNTER — Other Ambulatory Visit: Payer: Self-pay | Admitting: Family Medicine

## 2023-01-22 DIAGNOSIS — I4892 Unspecified atrial flutter: Secondary | ICD-10-CM

## 2023-02-05 DIAGNOSIS — M1711 Unilateral primary osteoarthritis, right knee: Secondary | ICD-10-CM | POA: Diagnosis not present

## 2023-02-15 DIAGNOSIS — G4733 Obstructive sleep apnea (adult) (pediatric): Secondary | ICD-10-CM | POA: Diagnosis not present

## 2023-02-19 ENCOUNTER — Other Ambulatory Visit: Payer: Self-pay | Admitting: Family Medicine

## 2023-02-19 DIAGNOSIS — M129 Arthropathy, unspecified: Secondary | ICD-10-CM

## 2023-02-22 ENCOUNTER — Other Ambulatory Visit: Payer: Self-pay | Admitting: Family Medicine

## 2023-02-23 ENCOUNTER — Other Ambulatory Visit: Payer: Self-pay

## 2023-02-23 MED ORDER — CARVEDILOL 12.5 MG PO TABS: 12.5000 mg | ORAL_TABLET | Freq: Two times a day (BID) | ORAL | 1 refills | Status: DC

## 2023-03-06 DIAGNOSIS — I10 Essential (primary) hypertension: Secondary | ICD-10-CM | POA: Diagnosis not present

## 2023-03-06 DIAGNOSIS — E890 Postprocedural hypothyroidism: Secondary | ICD-10-CM | POA: Diagnosis not present

## 2023-03-06 DIAGNOSIS — E7800 Pure hypercholesterolemia, unspecified: Secondary | ICD-10-CM | POA: Diagnosis not present

## 2023-03-06 DIAGNOSIS — E1165 Type 2 diabetes mellitus with hyperglycemia: Secondary | ICD-10-CM | POA: Diagnosis not present

## 2023-03-16 ENCOUNTER — Other Ambulatory Visit: Payer: Self-pay | Admitting: Family Medicine

## 2023-05-07 DIAGNOSIS — E1165 Type 2 diabetes mellitus with hyperglycemia: Secondary | ICD-10-CM | POA: Diagnosis not present

## 2023-05-22 ENCOUNTER — Ambulatory Visit (INDEPENDENT_AMBULATORY_CARE_PROVIDER_SITE_OTHER): Payer: Medicare Other

## 2023-05-22 VITALS — BP 175/79 | HR 74 | Ht 64.0 in

## 2023-05-22 DIAGNOSIS — Z0000 Encounter for general adult medical examination without abnormal findings: Secondary | ICD-10-CM

## 2023-05-22 DIAGNOSIS — Z1231 Encounter for screening mammogram for malignant neoplasm of breast: Secondary | ICD-10-CM

## 2023-05-22 NOTE — Progress Notes (Unsigned)
 Subjective:   [name] is a 68 y.o. who presents for a Medicare Wellness preventive visit.  As a reminder, Annual Wellness Visits don't include a physical exam, and some assessments may be limited, especially if this visit is performed virtually. We may recommend an in-person visit if needed.  Visit Complete: {VISITMETHODVS:[id number]}  {AWVVIDEO:32072}  Persons Participating in Visit: {Persons Participating in Visit:32444}  AWV Questionnaire: {AWVQuestionnaire:32338}        Objective:     There were no vitals filed for this visit. There is no height or weight on file to calculate BMI.     05/16/2022    2:33 PM 11/13/2021    9:26 AM 11/09/2021    8:12 PM 10/28/2021   11:55 AM 09/01/2021   12:27 AM 08/31/2021    9:10 AM 04/30/2021    3:16 PM  Advanced Directives  Does Patient Have a Medical Advance Directive? No No No No  No No  Would patient like information on creating a medical advance directive? No - Patient declined No - Patient declined No - Patient declined No - Patient declined No - Patient declined  No - Patient declined    Current Medications (verified) Outpatient Encounter Medications as of 05/22/2023  Medication Sig   albuterol  (PROVENTIL ) (2.5 MG/3ML) 0.083% nebulizer solution USE 1 VIAL IN NEBULIZER EVERY 6 HOURS AS NEEDED FOR WHEEZING OR SHORTNESS OF BREATH. (Patient taking differently: Take 2.5 mg by nebulization every 6 (six) hours as needed for wheezing or shortness of breath.)   amLODipine  (NORVASC ) 10 MG tablet TAKE (1) TABLET BY MOUTH ONCE DAILY.   benazepril  (LOTENSIN ) 40 MG tablet TAKE (1) TABLET BY MOUTH ONCE DAILY.   carvedilol  (COREG ) 12.5 MG tablet Take 1 tablet (12.5 mg total) by mouth 2 (two) times daily with a meal.   Diclofenac  Sodium 3 % GEL SMARTSIG:2-4 Gram(s) Topical Twice Daily   DULoxetine (CYMBALTA) 60 MG capsule Take 1 capsule by mouth daily.   ELIQUIS  5 MG TABS tablet TAKE (1) TABLET BY MOUTH TWICE DAILY.   fluticasone  (FLOVENT   HFA) 44 MCG/ACT inhaler Inhale 2 puffs into the lungs 2 (two) times daily.   glucose blood (ONETOUCH ULTRA) test strip USE TO TEST BLOOD SUGAR 4 TIMES DAILY AS DIRECTED.   HUMALOG MIX 75/25 KWIKPEN (75-25) 100 UNIT/ML Kwikpen Inject 12 Units into the skin in the morning and at bedtime.   Insulin  Pen Needle 31G X 8 MM MISC Use to inject insulin  one time daily   levothyroxine  (SYNTHROID ) 100 MCG tablet Take 100 mcg by mouth daily before breakfast.   polyethylene glycol (MIRALAX  / GLYCOLAX ) 17 g packet Take 17 g by mouth daily as needed for mild constipation.   pravastatin  (PRAVACHOL ) 40 MG tablet TAKE (1) TABLET BY MOUTH AT BEDTIME.   traMADol  (ULTRAM ) 50 MG tablet Take 1-1.5 tablets (50-75 mg total) by mouth every 8 (eight) hours as needed for moderate pain (pain score 4-6) or severe pain (pain score 7-10).   triamcinolone  cream (KENALOG ) 0.1 % APPLY TO AFFECTED AREAS TWICE DAILY.   VENTOLIN  HFA 108 (90 Base) MCG/ACT inhaler INHALE 2 PUFFS INTO THE LUNGS EVERY 6 HOURS AS NEEDED FOR SHORTNESS OF BREATH OR WHEEZING.   No facility-administered encounter medications on file as of 05/22/2023.    Allergies (verified) Procardia [nifedipine], Aspirin , and Diltiazem    History: Past Medical History:  Diagnosis Date   Asthma    prn neb. and inhaler   Breast cancer (HCC) 01/13/2013   right   Dehydration 04/13/2013  GERD (gastroesophageal reflux disease)    Graves' disease    Hyperlipidemia    Hypertension    on multiple meds., has been on med. > 14 yr.   Non-insulin  dependent type 2 diabetes mellitus (HCC)    Obesity    Ophthalmic manifestation of Graves disease    Osteoarthritis 01/13/2001   left knee   Radiation 10/11/13-11/29/13   Right Breast   Sleep apnea 04/14/2014   mod  , wears cpap 10 , ramps from 4 , nasal mask   Wears glasses    Past Surgical History:  Procedure Laterality Date   CARDIOVERSION N/A 11/08/2013   Procedure: CARDIOVERSION;  Surgeon: Lenise Quince, MD;   [facility] ENDOSCOPY;  Service: Cardiovascular;  Laterality: N/A;   CHOLECYSTECTOMY N/A 11/13/2021   Procedure: LAPAROSCOPIC CHOLECYSTECTOMY;  Surgeon: Awilda Bogus, MD;  Location: [facility] ORS;  Service: General;  Laterality: N/A;   COLONOSCOPY  2007   COLONOSCOPY W/ POLYPECTOMY  2009   EXCISION OF KELOID N/A 05/18/2014   Procedure: EXCISION OF KELOID;  Surgeon: Sim Dryer, MD;  Location: [facility];  Service: General;  Laterality: N/A;   PORT-A-CATH REMOVAL Right 05/18/2014   Procedure: REMOVAL PORT-A-CATH;  Surgeon: Sim Dryer, MD;  Location: [facility];  Service: General;  Laterality: Right;   PORTACATH PLACEMENT Right 02/17/2013   Procedure: INSERTION PORT-A-CATH;  Surgeon: Brandy Cal. Cornett, MD;  Location: [facility];  Service: General;  Laterality: Right;   TEE WITHOUT CARDIOVERSION N/A 11/08/2013   Procedure: TRANSESOPHAGEAL ECHOCARDIOGRAM (TEE);  Surgeon: Lenise Quince, MD;  Location: [facility] ENDOSCOPY;  Service: Cardiovascular;  Laterality: N/A;   TOTAL KNEE ARTHROPLASTY Left 2003   TOTAL THYROIDECTOMY     TUBAL LIGATION  1985   Family History  Problem Relation Age of Onset   Diabetes Mother    Heart disease Father    Lung cancer Father    Diabetes Sister    Hypertension Sister    Asthma Sister    Rheum arthritis Daughter    Hypertension Daughter    Fibromyalgia Daughter    Hypertension Daughter    Social History   Socioeconomic History   Marital status: Divorced    Spouse name: Not on file   Number of children: 2   Years of education: 12   Highest education level: 12th grade  Occupational History   Occupation: Disability  Tobacco Use   Smoking status: Former    Current packs/day: 0.00    Average packs/day: 0.3 packs/day for 20.0 years (5.0 ttl pk-yrs)    Types: Cigarettes    Start date: 02/10/1971    Quit date: 02/10/1991    Years since quitting: 32.2    Passive exposure: Past   Smokeless tobacco: Never  Vaping  Use   Vaping status: Never Used  Substance and Sexual Activity   Alcohol  use: No   Drug use: No   Sexual activity: Not Currently    Partners: Male    Birth control/protection: Surgical  Other Topics Concern   Not on file  Social History Narrative   Lives in [place] with 2 daughters    Social Drivers of Health   Financial Resource Strain: Low Risk  (05/16/2022)   Overall Financial Resource Strain (CARDIA)    Difficulty of Paying Living Expenses: Not hard at all  Food Insecurity: No Food Insecurity (05/16/2022)   Hunger Vital Sign    Worried About Running Out of Food in the Last Year: Never true  Ran Out of Food in the Last Year: Never true  Transportation Needs: No Transportation Needs (05/16/2022)   PRAPARE - Administrator, Civil Service (Medical): No    Lack of Transportation (Non-Medical): No  Physical Activity: Inactive (05/16/2022)   Exercise Vital Sign    Days of Exercise per Week: 0 days    Minutes of Exercise per Session: 0 min  Stress: No Stress Concern Present (05/16/2022)   [employer] of Occupational Health - Occupational Stress Questionnaire    Feeling of Stress : Not at all  Social Connections: Moderately Isolated (05/16/2022)   Social Connection and Isolation Panel [NHANES]    Frequency of Communication with Friends and Family: More than three times a week    Frequency of Social Gatherings with Friends and Family: More than three times a week    Attends Religious Services: More than 4 times per year    Active Member of Clubs or Organizations: No    Attends Banker Meetings: Never    Marital Status: Divorced    Tobacco Counseling Counseling given: Not Answered    Clinical Intake:              Lab Results  Component Value Date   HGBA1C 7.7 (H) 11/06/2021   HGBA1C 7.4 (H) 05/30/2021   HGBA1C 7.0 (H) 05/15/2020               Activities of Daily Living ***     No data to display          Patient Care  Team: Cook, Jayce G, DO as PCP - General (Family Medicine) Liliane Rei, MD (Orthopedic Surgery) Tasia Farr, MD as Referring Physician (Endocrinology) Bintrim, Drucie Georgia, MD as Referring Physician (Anesthesiology) Stridh, Patrik Erik, PA-C (Orthopedic Surgery)  Indicate any recent Medical Services you may have received from other than [facility] providers in the past year (date may be approximate).     Assessment:    This is a routine wellness examination for [facility].  Hearing/Vision screen No results found.   Goals Addressed   None    Depression Screen ***    05/26/2022    1:25 PM 05/16/2022    2:33 PM 02/24/2022    1:18 PM 11/25/2021    2:25 PM 11/06/2021   11:33 AM 10/28/2021   11:51 AM 04/30/2021    3:12 PM  PHQ 2/9 Scores  PHQ - 2 Score 2 0 0 0 0 0 0  PHQ- 9 Score 7  0        Fall Risk ***    05/26/2022    1:26 PM 05/16/2022    2:32 PM 02/24/2022    1:18 PM 11/25/2021    2:25 PM 11/06/2021   11:32 AM  Fall Risk   Falls in the past year? 0 0 0 0 0  Number falls in past yr: 0 0 0 0 0  Injury with Fall? 0 0 0 0 0  Risk for fall due to : Impaired mobility Impaired mobility No Fall Risks No Fall Risks No Fall Risks  Follow up  Falls prevention discussed;Education provided;Falls evaluation completed Falls evaluation completed Falls evaluation completed Falls evaluation completed    MEDICARE RISK AT HOME: ***    TIMED UP AND GO:  Was the test performed?  {AMBTIMEDUPGO:[id number]}  Cognitive Function: {CognitiveScreening:32337}        05/16/2022    2:32 PM 04/30/2021    3:19 PM  6CIT Screen  What Year? 0 points  0 points  What month? 0 points 0 points  What time? 0 points 0 points  Count back from 20 0 points 0 points  Months in reverse 0 points 4 points  Repeat phrase 0 points 4 points  Total Score 0 points 8 points    Immunizations Immunization History  Administered Date(s) Administered   Fluad Quad(high Dose 65+) 11/15/2019, 10/31/2020, 11/06/2021    Influenza Split 09/28/2012   Influenza,inj,Quad PF,6+ Mos 11/01/2013, 10/25/2014, 11/22/2015, 11/19/2016, 12/03/2017, 11/20/2018   Influenza,trivalent, recombinat, inj, PF 10/03/2022   Pneumococcal Polysaccharide-23 12/03/2017   Pneumococcal-Unspecified 12/12/2004    Screening Tests Health Maintenance  Topic Date Due   COVID-19 Vaccine (1) Never done   OPHTHALMOLOGY EXAM  Never done   DTaP/Tdap/Td (1 - Tdap) Never done   Zoster Vaccines- Shingrix (1 of 2) Never done   FOOT EXAM  06/03/2018   MAMMOGRAM  06/04/2018   HEMOGLOBIN A1C  05/08/2022   Medicare Annual Wellness (AWV)  05/16/2023   Diabetic kidney evaluation - eGFR measurement  05/26/2023   Diabetic kidney evaluation - Urine ACR  05/26/2023   INFLUENZA VACCINE  08/14/2023   Fecal DNA (Cologuard)  11/10/2025   DEXA SCAN  Completed   Hepatitis C Screening  Completed   HPV VACCINES  Aged Out   Meningococcal B Vaccine  Aged Out   Pneumonia Vaccine 68+ Years old  Discontinued    Health Maintenance  Health Maintenance Due  Topic Date Due   COVID-19 Vaccine (1) Never done   OPHTHALMOLOGY EXAM  Never done   DTaP/Tdap/Td (1 - Tdap) Never done   Zoster Vaccines- Shingrix (1 of 2) Never done   FOOT EXAM  06/03/2018   MAMMOGRAM  06/04/2018   HEMOGLOBIN A1C  05/08/2022   Medicare Annual Wellness (AWV)  05/16/2023   Diabetic kidney evaluation - eGFR measurement  05/26/2023   Diabetic kidney evaluation - Urine ACR  05/26/2023   Health Maintenance Items Addressed: {HMMCR (Optional):30011}  Additional Screening:  Vision Screening: Recommended annual ophthalmology exams for early detection of glaucoma and other disorders of the eye.  Dental Screening: Recommended annual dental exams for proper oral hygiene  Community Resource Referral / Chronic Care Management: CRR required this visit?  {YES/NO:21197}  CCM required this visit?  {CCM Required choices:[phone]}   Plan:    I have personally reviewed and noted the  following in the patient's chart:   Medical and social history Use of alcohol , tobacco or illicit drugs  Current medications and supplements including opioid prescriptions. {Opioid [phone]} Functional ability and status Nutritional status Physical activity Advanced directives List of other physicians Hospitalizations, surgeries, and ER visits in previous 12 months Vitals Screenings to include cognitive, depression, and falls Referrals and appointments  In addition, I have reviewed and discussed with patient certain preventive protocols, quality metrics, and best practice recommendations. A written personalized care plan for preventive services as well as general preventive health recommendations were provided to patient.   Michaelle Adolphus, CMA   05/22/2023   After Visit Summary: {CHL AMB AWV After Visit Summary:[phone]}  Notes: {Nurse Notes:32343}

## 2023-05-25 NOTE — Patient Instructions (Signed)
 [name] , Thank you for taking time out of your busy schedule to complete your Annual Wellness Visit with me. I enjoyed our conversation and look forward to speaking with you again next year. I, as well as your care team,  appreciate your ongoing commitment to your health goals. Please review the following plan we discussed and let me know if I can assist you in the future.   Follow up Visits: Next Medicare AWV with our clinical staff: Friday, 05/27/24 @ 2:20p.m.   Have you seen your provider in the last 6 months (3 months if uncontrolled diabetes)? No Next Office Visit with your provider: n/a  Clinician Recommendations:  Aim for 30 minutes of exercise or brisk walking, 6-8 glasses of water, and 5 servings of fruits and vegetables each day. Please make sure you discuss the following w/your provider at your next visit.   Pt is aware and encourage to have the following done: Mammo, foot, Diabetic eye exam, A1c, and Tetanus and shingles vaccine        This is a list of the screening recommended for you and due dates:  Health Maintenance  Topic Date Due   Eye exam for diabetics  Never done   DTaP/Tdap/Td vaccine (1 - Tdap) Never done   Zoster (Shingles) Vaccine (1 of 2) Never done   Complete foot exam   06/03/2018   Mammogram  06/04/2018   Hemoglobin A1C  05/08/2022   Yearly kidney function blood test for diabetes  05/26/2023   Yearly kidney health urinalysis for diabetes  05/26/2023   COVID-19 Vaccine (1) 06/06/2024*   Flu Shot  08/14/2023   Medicare Annual Wellness Visit  05/21/2024   Cologuard (Stool DNA test)  11/10/2025   DEXA scan (bone density measurement)  Completed   Hepatitis C Screening  Completed   HPV Vaccine  Aged Out   Meningitis B Vaccine  Aged Out   Pneumonia Vaccine  Discontinued  *Topic was postponed. The date shown is not the original due date.    Advanced directives: (Declined) Advance directive discussed with you today. Even though you declined this today,  please call our office should you change your mind, and we can give you the proper paperwork for you to fill out. Advance Care Planning is important because it:  [x]  Makes sure you receive the medical care that is consistent with your values, goals, and preferences  [x]  It provides guidance to your family and loved ones and reduces their decisional burden about whether or not they are making the right decisions based on your wishes.  Follow the link provided in your after visit summary or read over the paperwork we have mailed to you to help you started getting your Advance Directives in place. If you need assistance in completing these, please reach out to us  so that we can help you!  See attachments for Preventive Care and Fall Prevention Tips.

## 2023-05-28 ENCOUNTER — Other Ambulatory Visit: Payer: Self-pay | Admitting: Family Medicine

## 2023-05-28 DIAGNOSIS — I10 Essential (primary) hypertension: Secondary | ICD-10-CM

## 2023-05-29 ENCOUNTER — Telehealth: Payer: Self-pay | Admitting: Family Medicine

## 2023-05-29 ENCOUNTER — Other Ambulatory Visit: Payer: Self-pay

## 2023-05-29 MED ORDER — ONETOUCH ULTRA VI STRP: ORAL_STRIP | 1 refills | Status: DC

## 2023-05-29 NOTE — Telephone Encounter (Signed)
 Refill on    glucose blood (ONETOUCH ULTRA) test strip  [facility]

## 2023-06-09 ENCOUNTER — Other Ambulatory Visit: Payer: Self-pay | Admitting: Family Medicine

## 2023-06-09 DIAGNOSIS — I10 Essential (primary) hypertension: Secondary | ICD-10-CM

## 2023-06-09 MED ORDER — BENAZEPRIL HCL 40 MG PO TABS: ORAL_TABLET | ORAL | 1 refills | Status: DC

## 2023-06-09 NOTE — Telephone Encounter (Signed)
 Unable to pend requested albuterol  (PROVENTIL ) (2.5 MG/3ML) 0.083% nebulizer.  Copied from CRM [id number]. Topic: Clinical - Medication Refill >> Jun 09, 2023 11:09 AM Danna Duster wrote: Medication:  albuterol  (PROVENTIL ) (2.5 MG/3ML) 0.083% nebulizer  Benazepril  (Lotensin ) 40 MG tablets   Has the patient contacted their pharmacy? Yes  (Agent: If no, request that the patient contact the pharmacy for the refill. If patient does not wish to contact the pharmacy document the reason why and proceed with request.) (Agent: If yes, when and what did the pharmacy advise?)  This is the patient's preferred pharmacy:  [facility] [place], [place] - [id number] [place] [address] Phone: [phone] Fax: [phone]  Is this the correct pharmacy for this prescription? Yes If no, delete pharmacy and type the correct one.   Has the prescription been filled recently? No  Is the patient out of the medication? Yes  Has the patient been seen for an appointment in the last year OR does the patient have an upcoming appointment? No  Can we respond through MyChart? Yes  Agent: Please be advised that Rx refills may take up to 3 business days. We ask that you follow-up with your pharmacy.

## 2023-06-09 NOTE — Telephone Encounter (Unsigned)
 Copied from CRM [id number]. Topic: Clinical - Medication Refill >> Jun 09, 2023 11:09 AM Danna Duster wrote: Medication:  albuterol  (PROVENTIL ) (2.5 MG/3ML) 0.083% nebulizer  Benazepril  (Lotensin ) 40 MG tablets   Has the patient contacted their pharmacy? Yes  (Agent: If no, request that the patient contact the pharmacy for the refill. If patient does not wish to contact the pharmacy document the reason why and proceed with request.) (Agent: If yes, when and what did the pharmacy advise?)  This is the patient's preferred pharmacy:  [facility] [place], [place] - [id number] [place] [address] Phone: [phone] Fax: [phone]  Is this the correct pharmacy for this prescription? Yes If no, delete pharmacy and type the correct one.   Has the prescription been filled recently? No  Is the patient out of the medication? Yes  Has the patient been seen for an appointment in the last year OR does the patient have an upcoming appointment? No  Can we respond through MyChart? Yes  Agent: Please be advised that Rx refills may take up to 3 business days. We ask that you follow-up with your pharmacy.

## 2023-06-09 NOTE — Telephone Encounter (Unsigned)
 Copied from CRM [id number]. Topic: Clinical - Medication Refill >> Jun 09, 2023 12:59 PM Danna Duster wrote: Medication: albuterol  (PROVENTIL ) (2.5 MG/3ML) 0.083% nebulizer solution, benazepril  (LOTENSIN ) 40 MG tablet  Has the patient contacted their pharmacy? Yes (Agent: If no, request that the patient contact the pharmacy for the refill. If patient does not wish to contact the pharmacy document the reason why and proceed with request.) (Agent: If yes, when and what did the pharmacy advise?)  This is the patient's preferred pharmacy:  [facility] [place], [place] - [id number] [place] [address] Phone: [phone] Fax: [phone]  Is this the correct pharmacy for this prescription? Yes If no, delete pharmacy and type the correct one.   Has the prescription been filled recently? No  Is the patient out of the medication? Yes  Has the patient been seen for an appointment in the last year OR does the patient have an upcoming appointment? Yes  Can we respond through MyChart? Yes  Agent: Please be advised that Rx refills may take up to 3 business days. We ask that you follow-up with your pharmacy.

## 2023-06-15 ENCOUNTER — Telehealth: Payer: Self-pay | Admitting: Family Medicine

## 2023-06-15 MED ORDER — PRAVASTATIN SODIUM 40 MG PO TABS: 40.0000 mg | ORAL_TABLET | Freq: Every day | ORAL | 0 refills | Status: DC

## 2023-06-15 NOTE — Telephone Encounter (Signed)
Received via fax Rx request: Prescription sent electronically to pharmacy  

## 2023-06-15 NOTE — Telephone Encounter (Signed)
 Refill on pravastatin  (PRAVACHOL ) 40 MG tablet  last filled 03/16/23 [facility]

## 2023-06-21 ENCOUNTER — Other Ambulatory Visit: Payer: Self-pay | Admitting: Family Medicine

## 2023-06-21 DIAGNOSIS — I10 Essential (primary) hypertension: Secondary | ICD-10-CM

## 2023-06-22 ENCOUNTER — Other Ambulatory Visit: Payer: Self-pay | Admitting: Family Medicine

## 2023-06-22 DIAGNOSIS — I4892 Unspecified atrial flutter: Secondary | ICD-10-CM

## 2023-07-09 ENCOUNTER — Other Ambulatory Visit: Payer: Self-pay | Admitting: Physician Assistant

## 2023-07-09 DIAGNOSIS — M129 Arthropathy, unspecified: Secondary | ICD-10-CM

## 2023-07-12 ENCOUNTER — Other Ambulatory Visit: Payer: Self-pay | Admitting: Family Medicine

## 2023-07-12 DIAGNOSIS — M129 Arthropathy, unspecified: Secondary | ICD-10-CM

## 2023-07-31 ENCOUNTER — Encounter: Payer: Self-pay | Admitting: Advanced Practice Midwife

## 2023-08-04 DIAGNOSIS — Z79899 Other long term (current) drug therapy: Secondary | ICD-10-CM | POA: Diagnosis not present

## 2023-08-04 DIAGNOSIS — Z96652 Presence of left artificial knee joint: Secondary | ICD-10-CM | POA: Diagnosis not present

## 2023-08-04 DIAGNOSIS — M1711 Unilateral primary osteoarthritis, right knee: Secondary | ICD-10-CM | POA: Diagnosis not present

## 2023-08-19 ENCOUNTER — Ambulatory Visit: Admitting: Family Medicine

## 2023-08-19 ENCOUNTER — Encounter: Payer: Self-pay | Admitting: Family Medicine

## 2023-08-19 DIAGNOSIS — E1169 Type 2 diabetes mellitus with other specified complication: Secondary | ICD-10-CM | POA: Diagnosis not present

## 2023-08-19 DIAGNOSIS — Z8679 Personal history of other diseases of the circulatory system: Secondary | ICD-10-CM

## 2023-08-19 DIAGNOSIS — I10 Essential (primary) hypertension: Secondary | ICD-10-CM | POA: Diagnosis not present

## 2023-08-19 DIAGNOSIS — E785 Hyperlipidemia, unspecified: Secondary | ICD-10-CM | POA: Diagnosis not present

## 2023-08-19 DIAGNOSIS — Z0000 Encounter for general adult medical examination without abnormal findings: Secondary | ICD-10-CM

## 2023-08-19 DIAGNOSIS — M1711 Unilateral primary osteoarthritis, right knee: Secondary | ICD-10-CM | POA: Diagnosis not present

## 2023-08-19 DIAGNOSIS — M129 Arthropathy, unspecified: Secondary | ICD-10-CM

## 2023-08-19 DIAGNOSIS — Z7901 Long term (current) use of anticoagulants: Secondary | ICD-10-CM

## 2023-08-19 MED ORDER — TRAMADOL HCL 50 MG PO TABS: 50.0000 mg | ORAL_TABLET | Freq: Three times a day (TID) | ORAL | 3 refills | Status: DC | PRN

## 2023-08-19 MED ORDER — APIXABAN 5 MG PO TABS: 5.0000 mg | ORAL_TABLET | Freq: Two times a day (BID) | ORAL | 3 refills | Status: AC

## 2023-08-19 NOTE — Assessment & Plan Note (Signed)
 BP mildly elevated here today. Continue current medications. Will continue to monitor.

## 2023-08-19 NOTE — Assessment & Plan Note (Signed)
 Stable.  Tramadol refilled.

## 2023-08-19 NOTE — Assessment & Plan Note (Signed)
 Lipid panel to assess. Continue statin.

## 2023-08-19 NOTE — Progress Notes (Signed)
 Subjective:  Patient ID: [name], female    DOB: 03/17/1955  Age: 68 y.o. MRN: [id number]  CC:   Chief Complaint  Patient presents with   Follow-up    Med refills     HPI:  68 year old female with the below mentioned medical problems presents for follow up.  Accompanied by her daughter today.   Remains nonambulatory. Can stand but is not walking. No able to use the toilet. Wears protective underwear.  Needs mammogram. Has history of breast cancer. Advised her to schedule.  Has chronic pain, particularly of the right knee. Managed with Tramadol . Needs refill.  Is also on Cymbalta.   Has a history of Aflutter. Remains on Eliquis . Needs refill.   Diabetes managed by Endo. No current available A1c. Needs foot exam. Needs urine microalbumin. Has eye exam in October.  BP mildly elevated here today.  Compliant with Coreg , Benazepril , and Amlodipine .  Patient Active Problem List   Diagnosis Date Noted   Hyperlipidemia associated with type 2 diabetes mellitus (HCC) 08/19/2023   Osteoarthritis of right knee 11/07/2021   Morbid obesity (HCC) 11/06/2021   H/O atrial flutter 09/01/2021   Diabetic neuropathy (HCC) 04/30/2021   Healthcare maintenance 10/31/2020   Depression 05/24/2015   Obstructive sleep apnea 04/04/2014   Mitral regurgitation 11/08/2013   Malignant neoplasm of upper-outer quadrant of right breast in female, estrogen receptor positive (HCC) 01/31/2013   Graves' eye disease 05/20/2011   Postoperative hypothyroidism 03/13/2011   Asthma 12/08/2010   Hypertension    Type 2 diabetes mellitus with hyperlipidemia (HCC)    Tobacco abuse, in remission     Social Hx   Social History   Socioeconomic History   Marital status: Divorced    Spouse name: Not on file   Number of children: 2   Years of education: 12   Highest education level: 12th grade  Occupational History   Occupation: Disability  Tobacco Use   Smoking status: Former    Current packs/day:  0.00    Average packs/day: 0.3 packs/day for 20.0 years (5.0 ttl pk-yrs)    Types: Cigarettes    Start date: 02/10/1971    Quit date: 02/10/1991    Years since quitting: 32.5    Passive exposure: Past   Smokeless tobacco: Never  Vaping Use   Vaping status: Never Used  Substance and Sexual Activity   Alcohol  use: No   Drug use: No   Sexual activity: Not Currently    Partners: Male    Birth control/protection: Surgical  Other Topics Concern   Not on file  Social History Narrative   Lives in [place] with 2 daughters    Social Drivers of Health   Financial Resource Strain: Low Risk  (05/22/2023)   Overall Financial Resource Strain (CARDIA)    Difficulty of Paying Living Expenses: Not hard at all  Food Insecurity: No Food Insecurity (05/22/2023)   Hunger Vital Sign    Worried About Running Out of Food in the Last Year: Never true    Ran Out of Food in the Last Year: Never true  Transportation Needs: No Transportation Needs (05/22/2023)   PRAPARE - Administrator, Civil Service (Medical): No    Lack of Transportation (Non-Medical): No  Physical Activity: Insufficiently Active (05/22/2023)   Exercise Vital Sign    Days of Exercise per Week: 3 days    Minutes of Exercise per Session: 30 min  Stress: No Stress Concern Present (05/22/2023)   [place]  [employer] - Occupational Stress Questionnaire    Feeling of Stress : Not at all  Social Connections: Moderately Isolated (05/22/2023)   Social Connection and Isolation Panel    Frequency of Communication with Friends and Family: More than three times a week    Frequency of Social Gatherings with Friends and Family: More than three times a week    Attends Religious Services: Never    Database administrator or Organizations: Yes    Attends Banker Meetings: Never    Marital Status: Divorced    Review of Systems  Respiratory: Negative.    Cardiovascular: Negative.     Objective:  BP (!)  151/82   Pulse 77   Temp (!) 97.3 F (36.3 C)   Ht 5' 4 (1.626 m)   SpO2 98%   BMI 55.10 kg/m      08/19/2023   11:40 AM 08/19/2023   11:16 AM 05/22/2023    2:00 PM  BP/Weight  Systolic BP 151 165 175  Diastolic BP 82 89 79  Wt. (Lbs)  -- --    Physical Exam Vitals and nursing note reviewed.  Constitutional:      General: She is not in acute distress.    Appearance: Normal appearance. She is obese.  HENT:     Head: Normocephalic and atraumatic.  Eyes:     General:        Right eye: No discharge.        Left eye: No discharge.     Conjunctiva/sclera: Conjunctivae normal.  Cardiovascular:     Rate and Rhythm: Normal rate and regular rhythm.  Pulmonary:     Effort: Pulmonary effort is normal.     Breath sounds: Normal breath sounds. No wheezing, rhonchi or rales.  Feet:     Comments: Diabetic foot exam performed today. See quality metrics. Neurological:     Mental Status: She is alert.  Psychiatric:        Mood and Affect: Mood normal.        Behavior: Behavior normal.     Lab Results  Component Value Date   WBC 4.8 05/26/2022   HGB 12.1 05/26/2022   HCT 37.8 05/26/2022   PLT 228 05/26/2022   GLUCOSE 156 (H) 05/26/2022   CHOL 150 05/26/2022   TRIG 97 05/26/2022   HDL 63 05/26/2022   LDLCALC 69 05/26/2022   ALT 21 05/26/2022   AST 22 05/26/2022   NA 142 05/26/2022   K 4.1 05/26/2022   CL 101 05/26/2022   CREATININE 0.80 05/26/2022   BUN 12 05/26/2022   CO2 28 05/26/2022   TSH 1.330 05/30/2021   INR 1.6 (H) 09/09/2021   HGBA1C 7.7 (H) 11/06/2021     Assessment & Plan:  Arthritis, multiple joint involvement -     traMADol  HCl; Take 1-1.5 tablets (50-75 mg total) by mouth every 8 (eight) hours as needed for moderate pain (pain score 4-6) or severe pain (pain score 7-10).  Dispense: 90 tablet; Refill: 3  Type 2 diabetes mellitus with hyperlipidemia (HCC) Assessment & Plan: Managed by Endo. Labs including urine microalbumin ordered.  Orders: -      CMP14+EGFR -     Hemoglobin A1c -     Microalbumin / creatinine urine ratio  Hyperlipidemia associated with type 2 diabetes mellitus (HCC) Assessment & Plan: Lipid panel to assess. Continue statin.  Orders: -     Lipid panel  Chronic anticoagulation -  CBC  Healthcare maintenance Assessment & Plan: Advised to schedule mammogram.   H/O atrial flutter Assessment & Plan: Eliquis  refilled.  Orders: -     Apixaban ; Take 1 tablet (5 mg total) by mouth 2 (two) times daily.  Dispense: 180 tablet; Refill: 3  Primary hypertension Assessment & Plan: BP mildly elevated here today. Continue current medications. Will continue to monitor.   Primary osteoarthritis of right knee Assessment & Plan: Stable. Tramadol  refilled.  Orders: -     traMADol  HCl; Take 1-1.5 tablets (50-75 mg total) by mouth every 8 (eight) hours as needed for moderate pain (pain score 4-6) or severe pain (pain score 7-10).  Dispense: 90 tablet; Refill: 3    Follow-up:  6 months  Jarquis Walker Bluford DO [facility]

## 2023-08-19 NOTE — Patient Instructions (Addendum)
 Labs today.   Try and get a urine sample.  Get your mammogram please.  Follow up in 6 months.

## 2023-08-19 NOTE — Assessment & Plan Note (Signed)
 Eliquis refilled.

## 2023-08-19 NOTE — Assessment & Plan Note (Signed)
 Managed by Endo. Labs including urine microalbumin ordered.

## 2023-08-19 NOTE — Assessment & Plan Note (Signed)
Advised to schedule mammogram.

## 2023-08-20 ENCOUNTER — Ambulatory Visit: Payer: Self-pay | Admitting: Family Medicine

## 2023-08-20 LAB — LIPID PANEL
Chol/HDL Ratio: 2.7 ratio (ref 0.0–4.4)
Cholesterol, Total: 162 mg/dL (ref 100–199)
HDL: 60 mg/dL (ref >39)
LDL Chol Calc (NIH): 85 mg/dL (ref 0–99)
Triglycerides: 93 mg/dL (ref 0–149)
VLDL Cholesterol Cal: 17 mg/dL (ref 5–40)

## 2023-08-20 LAB — CBC
Hematocrit: 41.4 % (ref 34.0–46.6)
Hemoglobin: 13 g/dL (ref 11.1–15.9)
MCH: 27.6 pg (ref 26.6–33.0)
MCHC: 31.4 g/dL — ABNORMAL LOW (ref 31.5–35.7)
MCV: 88 fL (ref 79–97)
Platelets: 272 x10E3/uL (ref 150–450)
RBC: 4.71 x10E6/uL (ref 3.77–5.28)
RDW: 15.7 % — ABNORMAL HIGH (ref 11.7–15.4)
WBC: 6.5 x10E3/uL (ref 3.4–10.8)

## 2023-08-20 LAB — CMP14+EGFR
ALT: 15 IU/L (ref 0–32)
AST: 18 IU/L (ref 0–40)
Albumin: 4.1 g/dL (ref 3.9–4.9)
Alkaline Phosphatase: 101 IU/L (ref 44–121)
BUN/Creatinine Ratio: 11 — ABNORMAL LOW (ref 12–28)
BUN: 10 mg/dL (ref 8–27)
Bilirubin Total: 0.3 mg/dL (ref 0.0–1.2)
CO2: 23 mmol/L (ref 20–29)
Calcium: 9.5 mg/dL (ref 8.7–10.3)
Chloride: 100 mmol/L (ref 96–106)
Creatinine, Ser: 0.91 mg/dL (ref 0.57–1.00)
Globulin, Total: 3.2 g/dL (ref 1.5–4.5)
Glucose: 172 mg/dL — ABNORMAL HIGH (ref 70–99)
Potassium: 4.3 mmol/L (ref 3.5–5.2)
Sodium: 141 mmol/L (ref 134–144)
Total Protein: 7.3 g/dL (ref 6.0–8.5)
eGFR: 68 mL/min/1.73 (ref >59)

## 2023-08-20 LAB — HEMOGLOBIN A1C
Est. average glucose Bld gHb Est-mCnc: 186 mg/dL
Hgb A1c MFr Bld: 8.1 % — ABNORMAL HIGH (ref 4.8–5.6)

## 2023-08-24 ENCOUNTER — Other Ambulatory Visit: Payer: Self-pay | Admitting: Family Medicine

## 2023-08-25 DIAGNOSIS — G4733 Obstructive sleep apnea (adult) (pediatric): Secondary | ICD-10-CM | POA: Diagnosis not present

## 2023-08-27 ENCOUNTER — Telehealth: Payer: Self-pay | Admitting: Family Medicine

## 2023-08-27 ENCOUNTER — Other Ambulatory Visit: Payer: Self-pay | Admitting: Nurse Practitioner

## 2023-08-27 MED ORDER — TRIAMCINOLONE ACETONIDE 0.1 % EX CREA: TOPICAL_CREAM | CUTANEOUS | 0 refills | Status: DC

## 2023-08-27 NOTE — Telephone Encounter (Signed)
 Refill on   triamcinolone  cream (KENALOG ) 0.1 %   [facility]

## 2023-08-27 NOTE — Telephone Encounter (Signed)
 Done

## 2023-09-03 DIAGNOSIS — E7800 Pure hypercholesterolemia, unspecified: Secondary | ICD-10-CM | POA: Diagnosis not present

## 2023-09-03 DIAGNOSIS — E890 Postprocedural hypothyroidism: Secondary | ICD-10-CM | POA: Diagnosis not present

## 2023-09-03 DIAGNOSIS — E1165 Type 2 diabetes mellitus with hyperglycemia: Secondary | ICD-10-CM | POA: Diagnosis not present

## 2023-09-03 DIAGNOSIS — I10 Essential (primary) hypertension: Secondary | ICD-10-CM | POA: Diagnosis not present

## 2023-09-04 LAB — HEMOGLOBIN A1C: Hemoglobin A1C: 7.7

## 2023-09-04 LAB — TSH: TSH: 4.83 (ref 0.41–5.90)

## 2023-09-10 ENCOUNTER — Other Ambulatory Visit: Payer: Self-pay | Admitting: Family Medicine

## 2023-09-10 DIAGNOSIS — E785 Hyperlipidemia, unspecified: Secondary | ICD-10-CM | POA: Diagnosis not present

## 2023-09-10 DIAGNOSIS — E1169 Type 2 diabetes mellitus with other specified complication: Secondary | ICD-10-CM | POA: Diagnosis not present

## 2023-09-11 LAB — MICROALBUMIN / CREATININE URINE RATIO
Creatinine, Urine: 85.4 mg/dL
Microalb/Creat Ratio: 168 mg/g{creat} — ABNORMAL HIGH (ref 0–29)
Microalbumin, Urine: 143.3 ug/mL

## 2023-09-16 ENCOUNTER — Encounter: Payer: Self-pay | Admitting: Family Medicine

## 2023-09-23 ENCOUNTER — Other Ambulatory Visit: Payer: Self-pay | Admitting: Family Medicine

## 2023-09-23 MED ORDER — DAPAGLIFLOZIN PROPANEDIOL 5 MG PO TABS: 5.0000 mg | ORAL_TABLET | Freq: Every day | ORAL | 3 refills | Status: AC

## 2023-09-24 ENCOUNTER — Telehealth: Payer: Self-pay | Admitting: Pharmacy Technician

## 2023-09-24 ENCOUNTER — Other Ambulatory Visit (HOSPITAL_COMMUNITY): Payer: Self-pay

## 2023-09-24 NOTE — Telephone Encounter (Signed)
 Pharmacy Patient Advocate Encounter   Received notification from Onbase that prior authorization for Dapagliflozin  Propanediol 5mg  tablets is required/requested.   Insurance verification completed.   The patient is insured through [facility] MEDICARE .   Per test claim:  BRAND NAME FARXIGA  is preferred by the insurance.  If suggested medication is appropriate, Please send in a new RX and discontinue this one. If not, please advise as to why it's not appropriate so that we may request a Prior Authorization. Please note, some preferred medications may still require a PA.  If the suggested medications have not been trialed and there are no contraindications to their use, the PA will not be submitted, as it will not be approved.  New prescription is not required. Medications are interchangeable at the pharmacy as brand preferred. Patient's pharmacy dispensed brand name on 09/24/2023. Next refill is due on or after 10/17/2023.

## 2023-09-28 ENCOUNTER — Inpatient Hospital Stay (HOSPITAL_COMMUNITY)

## 2023-09-28 ENCOUNTER — Inpatient Hospital Stay (HOSPITAL_COMMUNITY)
Admission: EM | Admit: 2023-09-28 | Discharge: 2023-09-30 | DRG: 915 | Disposition: A | Discharge status: Home Health | Attending: Pulmonary Disease | Admitting: Pulmonary Disease

## 2023-09-28 ENCOUNTER — Other Ambulatory Visit: Payer: Self-pay

## 2023-09-28 ENCOUNTER — Encounter (HOSPITAL_COMMUNITY): Payer: Self-pay | Admitting: Emergency Medicine

## 2023-09-28 ENCOUNTER — Emergency Department (HOSPITAL_COMMUNITY)

## 2023-09-28 DIAGNOSIS — M1990 Unspecified osteoarthritis, unspecified site: Secondary | ICD-10-CM | POA: Diagnosis present

## 2023-09-28 DIAGNOSIS — J9601 Acute respiratory failure with hypoxia: Secondary | ICD-10-CM | POA: Diagnosis not present

## 2023-09-28 DIAGNOSIS — I4892 Unspecified atrial flutter: Secondary | ICD-10-CM | POA: Diagnosis not present

## 2023-09-28 DIAGNOSIS — Z825 Family history of asthma and other chronic lower respiratory diseases: Secondary | ICD-10-CM

## 2023-09-28 DIAGNOSIS — G8929 Other chronic pain: Secondary | ICD-10-CM | POA: Diagnosis present

## 2023-09-28 DIAGNOSIS — Z87891 Personal history of nicotine dependence: Secondary | ICD-10-CM | POA: Diagnosis not present

## 2023-09-28 DIAGNOSIS — E785 Hyperlipidemia, unspecified: Secondary | ICD-10-CM | POA: Diagnosis not present

## 2023-09-28 DIAGNOSIS — E890 Postprocedural hypothyroidism: Secondary | ICD-10-CM | POA: Diagnosis present

## 2023-09-28 DIAGNOSIS — Z96652 Presence of left artificial knee joint: Secondary | ICD-10-CM | POA: Diagnosis present

## 2023-09-28 DIAGNOSIS — T783XXA Angioneurotic edema, initial encounter: Secondary | ICD-10-CM | POA: Diagnosis not present

## 2023-09-28 DIAGNOSIS — Z79890 Hormone replacement therapy (postmenopausal): Secondary | ICD-10-CM

## 2023-09-28 DIAGNOSIS — Z9911 Dependence on respirator [ventilator] status: Secondary | ICD-10-CM

## 2023-09-28 DIAGNOSIS — Z7985 Long-term (current) use of injectable non-insulin antidiabetic drugs: Secondary | ICD-10-CM

## 2023-09-28 DIAGNOSIS — I10 Essential (primary) hypertension: Secondary | ICD-10-CM | POA: Diagnosis not present

## 2023-09-28 DIAGNOSIS — Z885 Allergy status to narcotic agent status: Secondary | ICD-10-CM

## 2023-09-28 DIAGNOSIS — A419 Sepsis, unspecified organism: Secondary | ICD-10-CM | POA: Diagnosis not present

## 2023-09-28 DIAGNOSIS — Z9049 Acquired absence of other specified parts of digestive tract: Secondary | ICD-10-CM

## 2023-09-28 DIAGNOSIS — Z993 Dependence on wheelchair: Secondary | ICD-10-CM

## 2023-09-28 DIAGNOSIS — Z794 Long term (current) use of insulin: Secondary | ICD-10-CM | POA: Diagnosis not present

## 2023-09-28 DIAGNOSIS — R609 Edema, unspecified: Secondary | ICD-10-CM | POA: Diagnosis not present

## 2023-09-28 DIAGNOSIS — J45909 Unspecified asthma, uncomplicated: Secondary | ICD-10-CM | POA: Diagnosis not present

## 2023-09-28 DIAGNOSIS — Z1152 Encounter for screening for COVID-19: Secondary | ICD-10-CM

## 2023-09-28 DIAGNOSIS — R0989 Other specified symptoms and signs involving the circulatory and respiratory systems: Secondary | ICD-10-CM | POA: Diagnosis not present

## 2023-09-28 DIAGNOSIS — Z833 Family history of diabetes mellitus: Secondary | ICD-10-CM | POA: Diagnosis not present

## 2023-09-28 DIAGNOSIS — Z6841 Body Mass Index (BMI) 40.0 and over, adult: Secondary | ICD-10-CM

## 2023-09-28 DIAGNOSIS — Z853 Personal history of malignant neoplasm of breast: Secondary | ICD-10-CM

## 2023-09-28 DIAGNOSIS — G4733 Obstructive sleep apnea (adult) (pediatric): Secondary | ICD-10-CM | POA: Diagnosis not present

## 2023-09-28 DIAGNOSIS — E66813 Obesity, class 3: Secondary | ICD-10-CM | POA: Diagnosis present

## 2023-09-28 DIAGNOSIS — E119 Type 2 diabetes mellitus without complications: Secondary | ICD-10-CM | POA: Diagnosis not present

## 2023-09-28 DIAGNOSIS — R0602 Shortness of breath: Secondary | ICD-10-CM | POA: Diagnosis not present

## 2023-09-28 DIAGNOSIS — Z7901 Long term (current) use of anticoagulants: Secondary | ICD-10-CM

## 2023-09-28 DIAGNOSIS — Z7401 Bed confinement status: Secondary | ICD-10-CM | POA: Diagnosis not present

## 2023-09-28 DIAGNOSIS — Z886 Allergy status to analgesic agent status: Secondary | ICD-10-CM

## 2023-09-28 DIAGNOSIS — T783XXD Angioneurotic edema, subsequent encounter: Secondary | ICD-10-CM | POA: Diagnosis not present

## 2023-09-28 DIAGNOSIS — Z7984 Long term (current) use of oral hypoglycemic drugs: Secondary | ICD-10-CM | POA: Diagnosis not present

## 2023-09-28 DIAGNOSIS — R000 Tachycardia, unspecified: Secondary | ICD-10-CM | POA: Diagnosis not present

## 2023-09-28 DIAGNOSIS — N179 Acute kidney failure, unspecified: Secondary | ICD-10-CM

## 2023-09-28 DIAGNOSIS — I517 Cardiomegaly: Secondary | ICD-10-CM | POA: Diagnosis not present

## 2023-09-28 DIAGNOSIS — R404 Transient alteration of awareness: Secondary | ICD-10-CM | POA: Diagnosis not present

## 2023-09-28 DIAGNOSIS — Z79899 Other long term (current) drug therapy: Secondary | ICD-10-CM

## 2023-09-28 DIAGNOSIS — Z4682 Encounter for fitting and adjustment of non-vascular catheter: Secondary | ICD-10-CM | POA: Diagnosis not present

## 2023-09-28 DIAGNOSIS — I493 Ventricular premature depolarization: Secondary | ICD-10-CM | POA: Diagnosis present

## 2023-09-28 DIAGNOSIS — Z7951 Long term (current) use of inhaled steroids: Secondary | ICD-10-CM

## 2023-09-28 DIAGNOSIS — Z888 Allergy status to other drugs, medicaments and biological substances status: Secondary | ICD-10-CM

## 2023-09-28 LAB — BLOOD GAS, VENOUS
Acid-Base Excess: 0.2 mmol/L (ref 0.0–2.0)
Bicarbonate: 26.9 mmol/L (ref 20.0–28.0)
Drawn by: 7020
O2 Saturation: 36.5 %
Patient temperature: 37.3
pCO2, Ven: 52 mmHg (ref 44–60)
pH, Ven: 7.33 (ref 7.25–7.43)
pO2, Ven: <31 mmHg — CL (ref 32–45)

## 2023-09-28 LAB — COMPREHENSIVE METABOLIC PANEL WITH GFR
ALT: 14 U/L (ref 0–44)
AST: 22 U/L (ref 15–41)
Albumin: 4.2 g/dL (ref 3.5–5.0)
Alkaline Phosphatase: 95 U/L (ref 38–126)
Anion gap: 14 (ref 5–15)
BUN: 13 mg/dL (ref 8–23)
CO2: 23 mmol/L (ref 22–32)
Calcium: 9 mg/dL (ref 8.9–10.3)
Chloride: 100 mmol/L (ref 98–111)
Creatinine, Ser: 0.88 mg/dL (ref 0.44–1.00)
GFR, Estimated: >60 mL/min (ref >60)
Glucose, Bld: 180 mg/dL — ABNORMAL HIGH (ref 70–99)
Potassium: 3.3 mmol/L — ABNORMAL LOW (ref 3.5–5.1)
Sodium: 137 mmol/L (ref 135–145)
Total Bilirubin: 0.6 mg/dL (ref 0.0–1.2)
Total Protein: 8.7 g/dL — ABNORMAL HIGH (ref 6.5–8.1)

## 2023-09-28 LAB — C-REACTIVE PROTEIN: CRP: 5.7 mg/dL — ABNORMAL HIGH (ref <1.0)

## 2023-09-28 LAB — SEDIMENTATION RATE: Sed Rate: 57 mm/h — ABNORMAL HIGH (ref 0–30)

## 2023-09-28 LAB — TROPONIN I (HIGH SENSITIVITY): Troponin I (High Sensitivity): 6 ng/L (ref <18)

## 2023-09-28 LAB — CBC WITH DIFFERENTIAL/PLATELET
Abs Immature Granulocytes: 0.06 K/uL (ref 0.00–0.07)
Basophils Absolute: 0.1 K/uL (ref 0.0–0.1)
Basophils Relative: 1 %
Eosinophils Absolute: 0.2 K/uL (ref 0.0–0.5)
Eosinophils Relative: 3 %
HCT: 42.2 % (ref 36.0–46.0)
Hemoglobin: 13.9 g/dL (ref 12.0–15.0)
Immature Granulocytes: 1 %
Lymphocytes Relative: 31 %
Lymphs Abs: 2.6 K/uL (ref 0.7–4.0)
MCH: 28.1 pg (ref 26.0–34.0)
MCHC: 32.9 g/dL (ref 30.0–36.0)
MCV: 85.4 fL (ref 80.0–100.0)
Monocytes Absolute: 0.7 K/uL (ref 0.1–1.0)
Monocytes Relative: 9 %
Neutro Abs: 4.7 K/uL (ref 1.7–7.7)
Neutrophils Relative %: 55 %
Platelets: 267 K/uL (ref 150–400)
RBC: 4.94 MIL/uL (ref 3.87–5.11)
RDW: 15.9 % — ABNORMAL HIGH (ref 11.5–15.5)
WBC: 8.3 K/uL (ref 4.0–10.5)
nRBC: 0 % (ref 0.0–0.2)

## 2023-09-28 LAB — BASIC METABOLIC PANEL WITH GFR
Anion gap: 15 (ref 5–15)
BUN: 15 mg/dL (ref 8–23)
CO2: 21 mmol/L — ABNORMAL LOW (ref 22–32)
Calcium: 8.9 mg/dL (ref 8.9–10.3)
Chloride: 99 mmol/L (ref 98–111)
Creatinine, Ser: 1.02 mg/dL — ABNORMAL HIGH (ref 0.44–1.00)
GFR, Estimated: 59 mL/min — ABNORMAL LOW (ref >60)
Glucose, Bld: 231 mg/dL — ABNORMAL HIGH (ref 70–99)
Potassium: 4.6 mmol/L (ref 3.5–5.1)
Sodium: 135 mmol/L (ref 135–145)

## 2023-09-28 LAB — BLOOD GAS, ARTERIAL
Acid-Base Excess: 2.8 mmol/L — ABNORMAL HIGH (ref 0.0–2.0)
Bicarbonate: 26.1 mmol/L (ref 20.0–28.0)
Drawn by: 10555
O2 Saturation: 99.7 %
Patient temperature: 37
pCO2 arterial: 35 mmHg (ref 32–48)
pH, Arterial: 7.48 — ABNORMAL HIGH (ref 7.35–7.45)
pO2, Arterial: 129 mmHg — ABNORMAL HIGH (ref 83–108)

## 2023-09-28 LAB — MAGNESIUM
Magnesium: 2 mg/dL (ref 1.7–2.4)
Magnesium: 1.9 mg/dL (ref 1.7–2.4)

## 2023-09-28 LAB — GLUCOSE, CAPILLARY
Glucose-Capillary: 246 mg/dL — ABNORMAL HIGH (ref 70–99)
Glucose-Capillary: 220 mg/dL — ABNORMAL HIGH (ref 70–99)
Glucose-Capillary: 205 mg/dL — ABNORMAL HIGH (ref 70–99)
Glucose-Capillary: 233 mg/dL — ABNORMAL HIGH (ref 70–99)

## 2023-09-28 LAB — PROTIME-INR
INR: 1.1 (ref 0.8–1.2)
Prothrombin Time: 14.5 s (ref 11.4–15.2)

## 2023-09-28 LAB — LACTIC ACID, PLASMA: Lactic Acid, Venous: 1.3 mmol/L (ref 0.5–1.9)

## 2023-09-28 LAB — TSH: TSH: 2.724 u[IU]/mL (ref 0.350–4.500)

## 2023-09-28 MED ORDER — FAMOTIDINE 20 MG PO TABS: 20.0000 mg | ORAL_TABLET | Freq: Two times a day (BID) | ORAL | Status: DC

## 2023-09-28 MED ORDER — METRONIDAZOLE 500 MG/100ML IV SOLN
500.0000 mg | Freq: Once | INTRAVENOUS | Status: AC
  Administered 2023-09-28: 500 mg via INTRAVENOUS
  Filled 2023-09-28: qty 100

## 2023-09-28 MED ORDER — SODIUM CHLORIDE 0.9 % IV SOLN
Freq: Once | INTRAVENOUS | Status: DC
Start: 2023-09-28 — End: 2023-09-28

## 2023-09-28 MED ORDER — ETOMIDATE 2 MG/ML IV SOLN
0.3000 mg/kg | Freq: Once | INTRAVENOUS | Status: AC
  Filled 2023-09-28: qty 30

## 2023-09-28 MED ORDER — CHLORHEXIDINE GLUCONATE CLOTH 2 % EX PADS
6.0000 | MEDICATED_PAD | Freq: Every day | CUTANEOUS | Status: DC
  Administered 2023-09-28 – 2023-09-29 (×2): 6 via TOPICAL

## 2023-09-28 MED ORDER — ETOMIDATE 2 MG/ML IV SOLN
INTRAVENOUS | Status: AC
  Administered 2023-09-28: 43.8 mg via INTRAVENOUS
  Filled 2023-09-28: qty 20

## 2023-09-28 MED ORDER — CARVEDILOL 12.5 MG PO TABS
12.5000 mg | ORAL_TABLET | Freq: Two times a day (BID) | ORAL | Status: DC
  Administered 2023-09-28 – 2023-09-29 (×2): 12.5 mg
  Filled 2023-09-28 (×3): qty 1

## 2023-09-28 MED ORDER — PROPOFOL 1000 MG/100ML IV EMUL
0.0000 ug/kg/min | INTRAVENOUS | Status: DC
  Administered 2023-09-28: 30 ug/kg/min via INTRAVENOUS
  Administered 2023-09-28: 40 ug/kg/min via INTRAVENOUS
  Administered 2023-09-28: 35 ug/kg/min via INTRAVENOUS
  Administered 2023-09-28: 40 ug/kg/min via INTRAVENOUS
  Administered 2023-09-28 (×2): 45 ug/kg/min via INTRAVENOUS
  Administered 2023-09-29 (×3): 50 ug/kg/min via INTRAVENOUS
  Filled 2023-09-28 (×8): qty 100

## 2023-09-28 MED ORDER — VANCOMYCIN HCL IN DEXTROSE 1-5 GM/200ML-% IV SOLN
1000.0000 mg | Freq: Once | INTRAVENOUS | Status: AC
  Administered 2023-09-28: 1000 mg via INTRAVENOUS
  Filled 2023-09-28: qty 200

## 2023-09-28 MED ORDER — ORAL CARE MOUTH RINSE
15.0000 mL | OROMUCOSAL | Status: DC
  Administered 2023-09-28 – 2023-09-29 (×13): 15 mL via OROMUCOSAL

## 2023-09-28 MED ORDER — ORAL CARE MOUTH RINSE: 15.0000 mL | OROMUCOSAL | Status: DC

## 2023-09-28 MED ORDER — FENTANYL CITRATE PF 50 MCG/ML IJ SOSY
50.0000 ug | PREFILLED_SYRINGE | INTRAMUSCULAR | Status: DC | PRN
  Administered 2023-09-28: 50 ug via INTRAVENOUS
  Administered 2023-09-28 – 2023-09-29 (×3): 100 ug via INTRAVENOUS
  Filled 2023-09-28: qty 2
  Filled 2023-09-28: qty 1
  Filled 2023-09-28: qty 2
  Filled 2023-09-28: qty 1
  Filled 2023-09-28 (×2): qty 2

## 2023-09-28 MED ORDER — POTASSIUM CHLORIDE 10 MEQ/100ML IV SOLN
INTRAVENOUS | Status: AC
  Filled 2023-09-28: qty 100

## 2023-09-28 MED ORDER — METHYLPREDNISOLONE SODIUM SUCC 40 MG IJ SOLR
40.0000 mg | Freq: Two times a day (BID) | INTRAMUSCULAR | Status: DC
  Administered 2023-09-28 – 2023-09-30 (×4): 40 mg via INTRAVENOUS
  Filled 2023-09-28 (×4): qty 1

## 2023-09-28 MED ORDER — FENTANYL CITRATE (PF) 100 MCG/2ML IJ SOLN
50.0000 ug | Freq: Once | INTRAMUSCULAR | Status: AC
  Administered 2023-09-28: 50 ug via INTRAVENOUS

## 2023-09-28 MED ORDER — FENTANYL CITRATE (PF) 100 MCG/2ML IJ SOLN
100.0000 ug | Freq: Once | INTRAMUSCULAR | Status: AC
  Administered 2023-09-28: 100 ug via INTRAVENOUS
  Filled 2023-09-28: qty 2

## 2023-09-28 MED ORDER — FENTANYL CITRATE (PF) 100 MCG/2ML IJ SOLN
INTRAMUSCULAR | Status: AC
  Filled 2023-09-28: qty 2

## 2023-09-28 MED ORDER — ROCURONIUM BROMIDE 10 MG/ML (PF) SYRINGE
1.0000 mg/kg | PREFILLED_SYRINGE | Freq: Once | INTRAVENOUS | Status: AC
  Filled 2023-09-28: qty 20

## 2023-09-28 MED ORDER — ACETAMINOPHEN 325 MG PO TABS: 650.0000 mg | ORAL_TABLET | Freq: Four times a day (QID) | ORAL | Status: DC | PRN

## 2023-09-28 MED ORDER — LABETALOL HCL 5 MG/ML IV SOLN
10.0000 mg | INTRAVENOUS | Status: DC | PRN
  Administered 2023-09-28 (×2): 10 mg via INTRAVENOUS
  Filled 2023-09-28 (×2): qty 4

## 2023-09-28 MED ORDER — LABETALOL HCL 5 MG/ML IV SOLN
5.0000 mg | Freq: Once | INTRAVENOUS | Status: AC
Start: 2023-09-28 — End: 2023-09-28
  Administered 2023-09-28: 5 mg via INTRAVENOUS
  Filled 2023-09-28: qty 4

## 2023-09-28 MED ORDER — IOHEXOL 300 MG/ML  SOLN: 75.0000 mL | Freq: Once | INTRAMUSCULAR | Status: DC | PRN

## 2023-09-28 MED ORDER — ARTIFICIAL TEARS OPHTHALMIC OINT
TOPICAL_OINTMENT | OPHTHALMIC | Status: DC | PRN
  Filled 2023-09-28: qty 3.5

## 2023-09-28 MED ORDER — INSULIN ASPART 100 UNIT/ML IJ SOLN
0.0000 [IU] | INTRAMUSCULAR | Status: DC
  Administered 2023-09-28 (×4): 5 [IU] via SUBCUTANEOUS
  Administered 2023-09-29: 3 [IU] via SUBCUTANEOUS
  Administered 2023-09-29: 5 [IU] via SUBCUTANEOUS

## 2023-09-28 MED ORDER — SUCCINYLCHOLINE CHLORIDE 200 MG/10ML IV SOSY
PREFILLED_SYRINGE | INTRAVENOUS | Status: AC
  Filled 2023-09-28: qty 10

## 2023-09-28 MED ORDER — POTASSIUM CHLORIDE 10 MEQ/100ML IV SOLN
10.0000 meq | INTRAVENOUS | Status: AC
  Administered 2023-09-28 (×3): 10 meq via INTRAVENOUS
  Filled 2023-09-28 (×2): qty 100

## 2023-09-28 MED ORDER — DOCUSATE SODIUM 50 MG/5ML PO LIQD
100.0000 mg | Freq: Two times a day (BID) | ORAL | Status: DC
  Administered 2023-09-28 – 2023-09-29 (×2): 100 mg
  Filled 2023-09-28 (×2): qty 10

## 2023-09-28 MED ORDER — MAGNESIUM SULFATE IN D5W 1-5 GM/100ML-% IV SOLN
1.0000 g | Freq: Once | INTRAVENOUS | Status: AC
  Administered 2023-09-28: 1 g via INTRAVENOUS
  Filled 2023-09-28: qty 100

## 2023-09-28 MED ORDER — LACTATED RINGERS IV BOLUS (SEPSIS)
500.0000 mL | Freq: Once | INTRAVENOUS | Status: AC
  Administered 2023-09-28: 500 mL via INTRAVENOUS

## 2023-09-28 MED ORDER — LEVOTHYROXINE SODIUM 100 MCG/5ML IV SOLN
100.0000 ug | Freq: Every day | INTRAVENOUS | Status: DC
  Administered 2023-09-28 – 2023-09-30 (×3): 100 ug via INTRAVENOUS
  Filled 2023-09-28 (×3): qty 5

## 2023-09-28 MED ORDER — METHYLPREDNISOLONE SODIUM SUCC 125 MG IJ SOLR
125.0000 mg | Freq: Once | INTRAMUSCULAR | Status: AC
  Administered 2023-09-28: 125 mg via INTRAVENOUS
  Filled 2023-09-28: qty 2

## 2023-09-28 MED ORDER — PREGABALIN 75 MG PO CAPS
100.0000 mg | ORAL_CAPSULE | Freq: Three times a day (TID) | ORAL | Status: DC
  Administered 2023-09-28 – 2023-09-30 (×6): 100 mg
  Filled 2023-09-28 (×6): qty 1

## 2023-09-28 MED ORDER — BUDESONIDE 0.5 MG/2ML IN SUSP
0.5000 mg | Freq: Two times a day (BID) | RESPIRATORY_TRACT | Status: DC
Start: 2023-09-28 — End: 2023-09-30
  Administered 2023-09-28 – 2023-09-30 (×5): 0.5 mg via RESPIRATORY_TRACT
  Filled 2023-09-28 (×5): qty 2

## 2023-09-28 MED ORDER — ROCURONIUM BROMIDE 10 MG/ML (PF) SYRINGE
PREFILLED_SYRINGE | INTRAVENOUS | Status: AC
  Administered 2023-09-28: 146 mg via INTRAVENOUS
  Filled 2023-09-28: qty 10

## 2023-09-28 MED ORDER — ORAL CARE MOUTH RINSE: 15.0000 mL | OROMUCOSAL | Status: DC | PRN

## 2023-09-28 MED ORDER — DIAZEPAM 5 MG/ML IJ SOLN
5.0000 mg | Freq: Once | INTRAMUSCULAR | Status: AC
  Administered 2023-09-28: 5 mg via INTRAVENOUS
  Filled 2023-09-28: qty 2

## 2023-09-28 MED ORDER — SODIUM CHLORIDE 0.9 % IV SOLN
2.0000 g | Freq: Once | INTRAVENOUS | Status: AC
  Administered 2023-09-28: 2 g via INTRAVENOUS
  Filled 2023-09-28: qty 12.5

## 2023-09-28 MED ORDER — FENTANYL CITRATE PF 50 MCG/ML IJ SOSY
50.0000 ug | PREFILLED_SYRINGE | INTRAMUSCULAR | Status: AC | PRN
  Administered 2023-09-28 – 2023-09-29 (×3): 50 ug via INTRAVENOUS
  Filled 2023-09-28 (×2): qty 1

## 2023-09-28 MED ORDER — APIXABAN 5 MG PO TABS
5.0000 mg | ORAL_TABLET | Freq: Two times a day (BID) | ORAL | Status: DC
  Administered 2023-09-28 – 2023-09-29 (×3): 5 mg
  Filled 2023-09-28 (×3): qty 1

## 2023-09-28 MED ORDER — FAMOTIDINE IN NACL 20-0.9 MG/50ML-% IV SOLN
20.0000 mg | Freq: Two times a day (BID) | INTRAVENOUS | Status: DC
  Administered 2023-09-28 – 2023-09-30 (×4): 20 mg via INTRAVENOUS
  Filled 2023-09-28 (×4): qty 50

## 2023-09-28 MED ORDER — FREE WATER
100.0000 mL | Status: DC
  Administered 2023-09-28 – 2023-09-29 (×7): 100 mL

## 2023-09-28 MED ORDER — ALBUTEROL SULFATE (2.5 MG/3ML) 0.083% IN NEBU: 2.5000 mg | INHALATION_SOLUTION | RESPIRATORY_TRACT | Status: DC | PRN

## 2023-09-28 MED ORDER — HYDROMORPHONE HCL 1 MG/ML IJ SOLN
1.0000 mg | Freq: Once | INTRAMUSCULAR | Status: AC
  Administered 2023-09-28: 1 mg via INTRAVENOUS
  Filled 2023-09-28: qty 1

## 2023-09-28 MED ORDER — PROPOFOL 1000 MG/100ML IV EMUL
INTRAVENOUS | Status: AC
  Filled 2023-09-28: qty 100

## 2023-09-28 MED ORDER — POLYETHYLENE GLYCOL 3350 17 G PO PACK
17.0000 g | PACK | Freq: Every day | ORAL | Status: DC
  Administered 2023-09-28 – 2023-09-29 (×2): 17 g
  Filled 2023-09-28 (×2): qty 1

## 2023-09-28 MED ORDER — POTASSIUM CHLORIDE 20 MEQ PO PACK
40.0000 meq | PACK | Freq: Once | ORAL | Status: AC
Start: 2023-09-28 — End: 2023-09-28
  Administered 2023-09-28: 40 meq
  Filled 2023-09-28: qty 2

## 2023-09-28 MED ORDER — PROPOFOL 1000 MG/100ML IV EMUL
0.0000 ug/kg/min | INTRAVENOUS | Status: DC
  Filled 2023-09-28: qty 100

## 2023-09-28 MED ORDER — INSULIN GLARGINE 100 UNIT/ML ~~LOC~~ SOLN
5.0000 [IU] | Freq: Two times a day (BID) | SUBCUTANEOUS | Status: DC
  Administered 2023-09-28 – 2023-09-29 (×2): 5 [IU] via SUBCUTANEOUS
  Filled 2023-09-28 (×3): qty 0.05

## 2023-09-28 NOTE — ED Provider Notes (Signed)
 [facility] EMERGENCY DEPARTMENT AT [facility] Provider Note   CSN: [id number] Arrival date & time: 09/28/23  0448     Patient presents with: Allergic Reaction   [name] is a 68 y.o. female.    Allergic Reaction Presenting symptoms: difficulty swallowing   Patient presents for tongue swelling.  Medical history includes breast cancer, asthma, OSA, depression, atrial flutter, Graves' disease, postoperative hypothyroidism.  She states that in the past she has had unilateral tongue swelling which was attributed to the blue dye in her thyroid  medication.  She switched to a nondiet formula.  She has since gone back to the blue dye pills.  Last night, she was in her normal state of health.  She woke up at 1 AM with swelling to tongue and floor of mouth.  This swelling is painful.  It has caused her difficulty breathing.  She arrives via EMS.  EMS gave 0.3 mg of intramuscular epinephrine  prior to arrival.     Prior to Admission medications   Medication Sig Start Date End Date Taking? Authorizing Provider  albuterol  (PROVENTIL ) (2.5 MG/3ML) 0.083% nebulizer solution USE 1 VIAL IN NEBULIZER EVERY 6 HOURS AS NEEDED FOR WHEEZING OR SHORTNESS OF BREATH. Patient taking differently: Take 2.5 mg by nebulization every 6 (six) hours as needed for wheezing or shortness of breath. 02/06/21   Cook, Jayce G, DO  amLODipine  (NORVASC ) 10 MG tablet TAKE (1) TABLET BY MOUTH ONCE DAILY. 06/23/23   Cook, Jayce G, DO  apixaban  (ELIQUIS ) 5 MG TABS tablet Take 1 tablet (5 mg total) by mouth 2 (two) times daily. 08/19/23   Cook, Jayce G, DO  benazepril  (LOTENSIN ) 40 MG tablet TAKE (1) TABLET BY MOUTH ONCE DAILY. 06/09/23   Cook, Jayce G, DO  carvedilol  (COREG ) 12.5 MG tablet TAKE 1 TABLET BY MOUTH 2 TIMES DAILY WITH A MEAL. 08/24/23   Cook, Jayce G, DO  Continuous Glucose Sensor (DEXCOM G7 SENSOR) MISC USE TO MONITOR BLOOD SUGAR. CHANGE EVERY 10 DAYS. 07/21/23   [provider]  dapagliflozin   propanediol (FARXIGA ) 5 MG TABS tablet Take 1 tablet (5 mg total) by mouth daily. 09/23/23   Cook, Jayce G, DO  Diclofenac  Sodium 3 % GEL SMARTSIG:2-4 Gram(s) Topical Twice Daily    [provider]  DULoxetine  (CYMBALTA ) 60 MG capsule Take 1 capsule by mouth daily. 04/30/22 08/19/23  [provider]  fluticasone  (FLOVENT  HFA) 44 MCG/ACT inhaler Inhale 2 puffs into the lungs 2 (two) times daily. 06/08/18   Alphonsa Elsie GORMAN, MD  glucose blood (ONETOUCH ULTRA) test strip USE TO TEST BLOOD SUGAR 4 TIMES DAILY AS DIRECTED. 05/29/23   Cook, Jayce G, DO  HUMALOG MIX 75/25 KWIKPEN (75-25) 100 UNIT/ML Kwikpen Inject 12 Units into the skin in the morning and at bedtime. 09/24/15   [provider]  Insulin  Pen Needle 31G X 8 MM MISC Use to inject insulin  one time daily 02/21/13   [provider]  levothyroxine  (SYNTHROID ) 100 MCG tablet Take 100 mcg by mouth daily before breakfast.    [provider]  MOUNJARO 10 MG/0.5ML Pen Inject 10 mg into the skin once a week.    [provider]  polyethylene glycol (MIRALAX  / GLYCOLAX ) 17 g packet Take 17 g by mouth daily as needed for mild constipation. 09/04/21   Maree, Pratik D, DO  pravastatin  (PRAVACHOL ) 40 MG tablet TAKE ONE TABLET BY MOUTH AT BEDTIME 09/10/23   Cook, Jayce G, DO  pregabalin  (LYRICA ) 100 MG capsule  Take 100 mg by mouth 3 (three) times daily. 07/27/23   [provider]  traMADol  (ULTRAM ) 50 MG tablet Take 1-1.5 tablets (50-75 mg total) by mouth every 8 (eight) hours as needed for moderate pain (pain score 4-6) or severe pain (pain score 7-10). 08/19/23   Cook, Jayce G, DO  triamcinolone  cream (KENALOG ) 0.1 % APPLY TO AFFECTED AREAS TWICE DAILY PRN 08/27/23   Mauro Elveria BROCKS, NP  VENTOLIN  HFA 108 (90 Base) MCG/ACT inhaler INHALE 2 PUFFS INTO THE LUNGS EVERY 6 HOURS AS NEEDED FOR SHORTNESS OF BREATH OR WHEEZING. 11/24/22   Cook, Jayce G, DO    Allergies: Procardia [nifedipine], Aspirin , and Diltiazem      Review of Systems  HENT:  Positive for facial swelling, trouble swallowing and voice change.   Respiratory:  Positive for shortness of breath.   All other systems reviewed and are negative.   Updated Vital Signs BP (!) 195/110   Pulse (!) 108   Resp (!) 24   Ht 5' 4 (1.626 m)   Wt (!) 146 kg   SpO2 100%   BMI 55.25 kg/m   Physical Exam Vitals and nursing note reviewed.  Constitutional:      General: She is not in acute distress.    Appearance: She is well-developed. She is ill-appearing. She is not toxic-appearing or diaphoretic.  HENT:     Head: Normocephalic and atraumatic.     Right Ear: External ear normal.     Left Ear: External ear normal.     Nose: Nose normal.     Mouth/Throat:     Comments: Significant tongue swelling, greater on the right; woody edema present floor of mouth.  Submental swelling crossing midline, slightly greater on right. Eyes:     Extraocular Movements: Extraocular movements intact.     Conjunctiva/sclera: Conjunctivae normal.  Cardiovascular:     Rate and Rhythm: Regular rhythm. Tachycardia present.     Heart sounds: No murmur heard. Pulmonary:     Effort: Pulmonary effort is normal. No respiratory distress.     Breath sounds: Normal breath sounds. No wheezing or rales.  Abdominal:     General: There is no distension.     Palpations: Abdomen is soft.     Tenderness: There is no abdominal tenderness.  Musculoskeletal:        General: No swelling. Normal range of motion.     Cervical back: Neck supple.  Skin:    General: Skin is warm and dry.     Coloration: Skin is not jaundiced or pale.  Neurological:     General: No focal deficit present.     Mental Status: She is alert and oriented to person, place, and time.  Psychiatric:        Mood and Affect: Mood normal.        Behavior: Behavior normal.     (all labs ordered are listed, but only abnormal results are displayed) Labs Reviewed  COMPREHENSIVE METABOLIC PANEL WITH GFR -  Abnormal; Notable for the following components:      Result Value   Potassium 3.3 (*)    Glucose, Bld 180 (*)    Total Protein 8.7 (*)    All other components within normal limits  CBC WITH DIFFERENTIAL/PLATELET - Abnormal; Notable for the following components:   RDW 15.9 (*)    All other components within normal limits  SEDIMENTATION RATE - Abnormal; Notable for the following components:   Sed Rate 57 (*)    All  other components within normal limits  BLOOD GAS, ARTERIAL - Abnormal; Notable for the following components:   pH, Arterial 7.48 (*)    pO2, Arterial 129 (*)    Acid-Base Excess 2.8 (*)    All other components within normal limits  CULTURE, BLOOD (ROUTINE X 2)  CULTURE, BLOOD (ROUTINE X 2)  RESP PANEL BY RT-PCR (RSV, FLU A&B, COVID)  RVPGX2  MRSA NEXT GEN BY PCR, NASAL  LACTIC ACID, PLASMA  PROTIME-INR  MAGNESIUM   TSH  LACTIC ACID, PLASMA  URINALYSIS, W/ REFLEX TO CULTURE (INFECTION SUSPECTED)  C-REACTIVE PROTEIN  TROPONIN I (HIGH SENSITIVITY)    EKG: EKG Interpretation Date/Time:  Monday September 28 2023 05:37:12 EDT Ventricular Rate:  118 PR Interval:  148 QRS Duration:  91 QT Interval:  344 QTC Calculation: 482 R Axis:   164  Text Interpretation: Right and left arm electrode reversal, interpretation assumes no reversal Sinus tachycardia Multiple ventricular premature complexes Lateral infarct, recent Abnormal T, consider ischemia, diffuse leads Confirmed by Melvenia Motto [phone]) on 09/28/2023 5:49:05 AM  Radiology: [facility] Chest Port 1 View Result Date: 09/28/2023 CLINICAL DATA:  Sepsis.  Shortness of breath. EXAM: PORTABLE CHEST 1 VIEW COMPARISON:  11/09/2021 FINDINGS: Endotracheal tube tip is approximately 2.8 cm above the base of the carina. The cardio pericardial silhouette is enlarged. There is pulmonary vascular congestion without overt pulmonary edema. No acute bony abnormality. Surgical clips overlie the lower neck bilaterally, as before. Telemetry leads  overlie the chest. IMPRESSION: 1. Endotracheal tube tip is approximately 2.8 cm above the base of the carina. 2. Enlargement of the cardiopericardial silhouette with pulmonary vascular congestion. Electronically Signed   By: Camellia Candle M.D.   On: 09/28/2023 05:39     Procedure Name: Intubation Date/Time: 09/28/2023 5:22 AM  Performed by: Melvenia Motto, MDPre-anesthesia Checklist: Patient identified, Patient being monitored and Suction available Oxygen Delivery Method: Ambu bag Preoxygenation: Pre-oxygenation with 100% oxygen Induction Type: Rapid sequence Ventilation: Mask ventilation with difficulty Laryngoscope Size: Glidescope and 4 Grade View: Grade II Tube size: 7.0 mm Number of attempts: 1 Airway Equipment and Method: Rigid stylet and Video-laryngoscopy Placement Confirmation: ETT inserted through vocal cords under direct vision, CO2 detector, Positive ETCO2 and Breath sounds checked- equal and bilateral Secured at: 26 cm Tube secured with: ETT holder Dental Injury: Teeth and Oropharynx as per pre-operative assessment  Difficulty Due To: Difficulty was anticipated       Medications Ordered in the ED  vancomycin  (VANCOCIN ) IVPB 1000 mg/200 mL premix (1,000 mg Intravenous New Bag/Given 09/28/23 0654)  vancomycin  (VANCOCIN ) IVPB 1000 mg/200 mL premix (has no administration in time range)  0.9 %  sodium chloride  infusion (has no administration in time range)  propofol  (DIPRIVAN ) 1000 MG/100ML infusion (20 mcg/kg/min  146 kg Intravenous Rate/Dose Change 09/28/23 0633)  Chlorhexidine  Gluconate Cloth 2 % PADS 6 each (has no administration in time range)  potassium chloride  10 mEq in 100 mL IVPB (has no administration in time range)  iohexol  (OMNIPAQUE ) 300 MG/ML solution 75 mL (has no administration in time range)  lactated ringers  bolus 500 mL (0 mLs Intravenous Stopped 09/28/23 0603)  ceFEPIme  (MAXIPIME ) 2 g in sodium chloride  0.9 % 100 mL IVPB (0 g Intravenous Stopped 09/28/23  0544)  metroNIDAZOLE  (FLAGYL ) IVPB 500 mg (0 mg Intravenous Stopped 09/28/23 0654)  methylPREDNISolone  sodium succinate (SOLU-MEDROL ) 125 mg/2 mL injection 125 mg (125 mg Intravenous Given 09/28/23 0523)  etomidate  (AMIDATE ) injection 43.8 mg (43.8 mg Intravenous Given 09/28/23 0514)  rocuronium  (ZEMURON ) injection 146  mg (146 mg Intravenous Given 09/28/23 0515)  fentaNYL  (SUBLIMAZE ) injection 50 mcg (50 mcg Intravenous Given 09/28/23 0513)  fentaNYL  (SUBLIMAZE ) injection 100 mcg (100 mcg Intravenous Given 09/28/23 0712)  labetalol  (NORMODYNE ) injection 5 mg (5 mg Intravenous Given 09/28/23 9287)                                    Medical Decision Making Amount and/or Complexity of Data Reviewed Labs: ordered. Radiology: ordered.  Risk Prescription drug management. Decision regarding hospitalization.   This patient presents to the ED for concern of oral swelling, this involves an extensive number of treatment options, and is a complaint that carries with it a high risk of complications and morbidity.  The differential diagnosis includes angioedema, other allergic reaction, Ludwig's angina, occult trauma   Co morbidities / Chronic conditions that complicate the patient evaluation  breast cancer, asthma, OSA, depression, atrial flutter, Graves' disease, postoperative hypothyroidism   Additional history obtained:  Additional history obtained from EMR External records from outside source obtained and reviewed including EMS, patient's family   Lab Tests:  I Ordered, and personally interpreted labs.  The pertinent results include: Normal kidney function, mild hypokalemia with otherwise normal electrolytes, normal hemoglobin, no leukocytosis, normal lactate   Imaging Studies ordered:  I ordered imaging studies including chest x-ray, CT face I independently visualized and interpreted imaging which showed x-ray shows some pulmonary vascular congestion.  CT was pending at time of  admission. I agree with the radiologist interpretation   Cardiac Monitoring: / EKG:  The patient was maintained on a cardiac monitor.  I personally viewed and interpreted the cardiac monitored which showed an underlying rhythm of: Sinus rhythm   Problem List / ED Course / Critical interventions / Medication management  Patient presenting with swelling to tongue, floor of mouth, submental area, and throat.  She woke up with the symptoms at 1 AM, several hours prior to arrival.  EMS gave 0.3 mg of intramuscular epinephrine  prior to arrival.  On arrival, patient is alert and oriented.  She has woody edema and floor of mouth.  She has significant tongue swelling, greater on the right.  She has submental swelling crossing midline and greater on the right.  Findings concerning for Ludwig's angina and/or angioedema.  She does describe prior episodes of angioedema which consisted of unilateral tongue swelling only.  Amount of swelling is concerning for impending airway compromise.  Patient was consented for intubation.  Patient was intubated, as per procedure note above.  VL intubation did reveal swelling down to area of glottis.  I was able to pass a 7.0 cm ETT.  Although presentation consistent with angioedema, patient to be treated empirically for Ludwig's angina with broad-spectrum antibiotics.  Solu-Medrol  ordered as well.  Will obtain imaging of facial area.  Propofol  was ordered for postintubation sedation.  I spoke with intensivist on-call, Dr. Dub, who accepts the patient in transfer for admission to ICU in [place].  Patient's lab work showed no leukocytosis.  Lactate was normal.  ESR, when age-adjusted, was also normal.  This is suggestive of angioedema rather than Ludwig's angina.  Further lab work showed hypokalemia was present and replacement potassium was ordered.  Current kidney function is normal.  CT of face was ordered.  Patient had elevated blood pressure while in the ED.  This was  from cuff measuring pressure on forearm, below the level of heart.  Propofol  gtt.  rate was increased.  Dose of labetalol  was ordered.  Patient's daughters confirm that she does take benazepril .  She has reportedly been taking this for years.  This may be the cause of her recurrent angioedema.  At time of signout, transfer to [facility] [address] pending. I ordered medication including IV fluids and broad-spectrum antibiotics for empiric treatment of sepsis; fentanyl  for analgesia; etomidate  and rocuronium  for RSI; propofol  for postintubation sedation; labetalol  for HTN Reevaluation of the patient after these medicines showed that the patient improved I have reviewed the patients home medicines and have made adjustments as needed   Consultations Obtained:  I requested consultation with the intensivist, Dr. Dub,  and discussed lab and imaging findings as well as pertinent plan - they recommend: Admission to ICU   Social Determinants of Health:  Lives independently  CRITICAL CARE Performed by: Bernardino Fireman   Total critical care time: 35 minutes  Critical care time was exclusive of separately billable procedures and treating other patients.  Critical care was necessary to treat or prevent imminent or life-threatening deterioration.  Critical care was time spent personally by me on the following activities: development of treatment plan with patient and/or surrogate as well as nursing, discussions with consultants, evaluation of patient's response to treatment, examination of patient, obtaining history from patient or surrogate, ordering and performing treatments and interventions, ordering and review of laboratory studies, ordering and review of radiographic studies, pulse oximetry and re-evaluation of patient's condition.     Final diagnoses:  Angioedema, initial encounter    ED Discharge Orders     None          Fireman Bernardino, MD 09/28/23 [phone]

## 2023-09-28 NOTE — Progress Notes (Signed)
 Notified of frequent PVCs, some trigeminy while more agitated, sedation adjusted and PVC's better but still frequent. SBP 180.  S/p KCL replete earlier.   P:  - coreg  ordered - recheck BMET/ mag now     Lacey Pesa, NP [facility] 09/28/2023, 4:03 PM  See Amion for pager If no response to pager , please call [phone] until 7pm After 7:00 pm call Elink  [phone]

## 2023-09-28 NOTE — ED Triage Notes (Signed)
 Pt bib EMS after she called for swelling of tongue, SOB, and feeling like her throat was closing. EMS reports pt had some medication changes recently but unsure if that caused it. EMS reports they administered 0.3 mg Epinephrine  IM en route. EDP @ bedside.

## 2023-09-28 NOTE — ED Notes (Signed)
 Pt intubated with a 7ETT at 26cm at the lip

## 2023-09-28 NOTE — ED Provider Notes (Signed)
  Physical Exam  BP (!) 195/110   Pulse (!) 108   Resp (!) 24   Ht 5' 4 (1.626 m)   Wt (!) 146 kg   SpO2 100%   BMI 55.25 kg/m   Physical Exam  Procedures  Procedures  ED Course / MDM    Medical Decision Making Amount and/or Complexity of Data Reviewed Labs: ordered. Radiology: ordered.  Risk Prescription drug management. Decision regarding hospitalization.   Patient intubated after angioedema.  Awaiting transfer to ICU. Patient otherwise stable. Nursing staff did indicate that patient has maxed out on propofol , still appears agitated.  I will put in IV Valium  and hydromorphone .  Patient noted to be tachycardic, hypertensive so suspect that she probably needs additional sedation.       Charlyn Sora, MD 09/28/23 [phone]

## 2023-09-28 NOTE — Progress Notes (Signed)
 I met with two of [name]'s daughters to provide support around their mom's unexpected hospitalization.  They are concerned that they do not yet know what caused her tongue to swell.  They have family support, but they are waiting to find out some answers before sharing more broadly with their family, especially since the family suffered the loss of [name]'s younger sister earlier this year.  I provided support by facilitating sharing emotions, assisting them with reflection on support resources and prayer.

## 2023-09-28 NOTE — Progress Notes (Signed)
 eLink Physician-Brief Progress Note Patient Name: [name] DOB: June 26, 1955 MRN: [id number]   Date of Service  09/28/2023  HPI/Events of Note  Admitted and intubated for angioedema hypertensive to 193/101 Home amlodipine  and carvedilol  continued, BenzePril held  eICU Interventions  Add labetalol  as needed   0110 -refractory hypertension, add hydralazine  second line, increase dose of the patient will  Intervention Category Intermediate Interventions: Hypertension - evaluation and management  Martin Smeal 09/28/2023, 8:18 PM

## 2023-09-28 NOTE — H&P (Addendum)
 NAME:  [name], MRN:  [id number], DOB:  1955-12-21, LOS: 0 ADMISSION DATE:  09/28/2023, CONSULTATION DATE:  09/28/23 REFERRING MD:  Dr. Charlyn, CHIEF COMPLAINT:  throat swelling   History of Present Illness:  HPI obtained from EMR as pt remains encephalopathic and per daughters at bedside.   27 yoM with PMH as below, significant for Graves disease with postoperative hypothyroidism, atrial flutter on eliquis , HTN, HLD, DMT2, asthma, OSA, arthritis, chronic pain, breast cancer who presented to [facility] ER with complaints of swelling to her tongue and floor of mouth and SOB with EMS.  Pt with prior hx of unilateral tongue swelling per daughters, no rash, fever, or URI, otherwise in her normal state of health yesterday.  This was her third episode per daughters, first in 11/2022 and second in 01/2023, both episodes did not require ER evaluation.  Improved at home with popsicles and benadryl . She followed up with her endocrine doctor, felt attributed then possibly to the blue dye in her thyroid  medication in which she switched to different med in January.  Recently switched back to original thyroid  with blue dye in August.  No known family hx of angioedema per daughters and otherwise no known issues with other blue dye in foods.  Recent new medication of farxiga  but otherwise no new meds.  Has been on benazepril  for several years.  At baseline is non weight bearing due to severe osteoarthritis and pain, is wheelchair bound.    She was treated with IM epi with EMS.  On arrival to ER, noted to have significant tongue and submental right greater than left swelling, with concern for Ludwig's angina vs angioedema.  Pt electively intubated for airway protection.  On intubation, swelling noted down to glottis.  Steroids and empiric broad spectrum abx started.  Further workup noted normal lactic, WBC, and ESR more suggestive of angioedema.  Treated with labetalol  prn and placed on propofol  and prn fentanyl  for  sedation.  CT maxillofacial ordered but not completed yet. Transferred to [facility] ICU for further care and evaluation, PCCM accepting.   Pertinent  Medical History   Past Medical History:  Diagnosis Date   Asthma    prn neb. and inhaler   Breast cancer (HCC) 01/13/2013   right   Dehydration 04/13/2013   GERD (gastroesophageal reflux disease)    Graves' disease    Hyperlipidemia    Hypertension    on multiple meds., has been on med. > 14 yr.   Non-insulin  dependent type 2 diabetes mellitus (HCC)    Obesity    Ophthalmic manifestation of Graves disease    Osteoarthritis 01/13/2001   left knee   Radiation 10/11/13-11/29/13   Right Breast   Sleep apnea 04/14/2014   mod  , wears cpap 10 , ramps from 4 , nasal mask   Wears glasses    Significant Hospital Events: Including procedures, antibiotic start and stop dates in addition to other pertinent events   9/15 tx [facility] ER intubated to [facility] for angioedema  Interim History / Subjective:    Objective    Blood pressure (!) 195/110, pulse (!) 108, resp. rate (!) 24, height 5' 4 (1.626 m), weight (!) 146 kg, SpO2 100%.    Vent Mode: PRVC FiO2 (%):  [40 %] 40 % Set Rate:  [24 bmp] 24 bmp Vt Set:  [430 mL] 430 mL PEEP:  [5 cmH20] 5 cmH20   Intake/Output Summary (Last 24 hours) at 09/28/2023 0906 Last data filed at 09/28/2023 9192 Gross  per 24 hour  Intake 400 ml  Output --  Net 400 ml   Filed Weights   09/28/23 0452  Weight: (!) 146 kg    Examination: propofol  40 General:  critically ill morbidly obese female sedated in NAD on MV HEENT: MM pink/moist, ETT 7 at 27 at lip, moderate tongue swelling noted, some oral secretions Neuro: sedated CV: rr, NSR, +1 pulses PULM:  MV supported, clear anteriorly, no wheeze GI: soft, +bs Extremities: warm/dry, no pitting LE edema  Skin: no rashes  Labs reviewed   Resolved problem list   Assessment and Plan   Angioedema- unclear etiology at this time.  Could be related to ACE-I, vs  allergy but no signs of anaphylaxis, hypotension or rash, vs r/o HAE Acute airway insufficiency related to above  Hx Asthma  - cont full LTVV support, 4-8cc/kg IBW with goal Pplat <30 and DP<15  - VAP prevention protocol/ PPI - PAD protocol for sedation> propofol  with prn fentanyl  with scheduled bowel regimen - intermittent CXR/ ABG - wean FiO2 as able for SpO2 >92%  - daily SAT & SBT when angioedema resolved, cuff leak present  - brovanna, prn albuterol   - cont solumedrol 40mg  BID, pepcid .  Consider benadryl  prn  - remains otherwise hemodynamically stable  - check C4 and C1 esterase inhibitor.  Slightly elevated ESR/ CRP could be related to underlying severe arthritis  - hold on empiric broad spectrum abx, follow BC and trend WBC/ fever curve - hold on ct maxillofacial   HTN HLD - would hold benazepril , avoid any ACE-I given angioedema - hold coreg , norvasc , can resume if remains hemodynamically stable   Atrial flutter on Eliquis   Compliant per daughters - remains in SR, cont tele monitoring - cont eliquis  5mg  BID - optimize electrolytes  Hypothyroidism - synthroid , will discuss with pharmacy to avoid any dyes  DMT2 - CBG q 4, prn SSI  Osteoarthritis/ chronic pain  Chronic immobility  - resume Cymbalta  when able to take PO - cont lyrica , hold hold tramadol   Obesity, BMI 55 - outpt weight loss counseling    Daughters updated at bedside, 9/15.   Labs   CBC: Recent Labs  Lab 09/28/23 0534  WBC 8.3  NEUTROABS 4.7  HGB 13.9  HCT 42.2  MCV 85.4  PLT 267    Basic Metabolic Panel: Recent Labs  Lab 09/28/23 0534  NA 137  K 3.3*  CL 100  CO2 23  GLUCOSE 180*  BUN 13  CREATININE 0.88  CALCIUM  9.0  MG 2.0   GFR: Estimated Creatinine Clearance: 86.9 mL/min (by C-G formula based on SCr of 0.88 mg/dL). Recent Labs  Lab 09/28/23 0534  WBC 8.3  LATICACIDVEN 1.3    Liver Function Tests: Recent Labs  Lab 09/28/23 0534  AST 22  ALT 14  ALKPHOS 95   BILITOT 0.6  PROT 8.7*  ALBUMIN 4.2   No results for input(s): LIPASE, AMYLASE in the last 168 hours. No results for input(s): AMMONIA in the last 168 hours.  ABG    Component Value Date/Time   PHART 7.48 (H) 09/28/2023 0552   PCO2ART 35 09/28/2023 0552   PO2ART 129 (H) 09/28/2023 0552   HCO3 26.1 09/28/2023 0552   TCO2 27.8 05/02/2013 1440   O2SAT 99.7 09/28/2023 0552     Coagulation Profile: Recent Labs  Lab 09/28/23 0534  INR 1.1    Cardiac Enzymes: No results for input(s): CKTOTAL, CKMB, CKMBINDEX, TROPONINI in the last 168 hours.  HbA1C: Hgb A1c  MFr Bld  Date/Time Value Ref Range Status  08/19/2023 12:10 PM 8.1 (H) 4.8 - 5.6 % Final    Comment:             Prediabetes: 5.7 - 6.4          Diabetes: >6.4          Glycemic control for adults with diabetes: <7.0   11/06/2021 12:27 PM 7.7 (H) 4.8 - 5.6 % Final    Comment:             Prediabetes: 5.7 - 6.4          Diabetes: >6.4          Glycemic control for adults with diabetes: <7.0     CBG: No results for input(s): GLUCAP in the last 168 hours.  Review of Systems:   Unable   Past Medical History:  She,  has a past medical history of Asthma, Breast cancer (HCC) (01/13/2013), Dehydration (04/13/2013), GERD (gastroesophageal reflux disease), Graves' disease, Hyperlipidemia, Hypertension, Non-insulin  dependent type 2 diabetes mellitus (HCC), Obesity, Ophthalmic manifestation of Graves disease, Osteoarthritis (01/13/2001), Radiation (10/11/13-11/29/13), Sleep apnea (04/14/2014), and Wears glasses.   Surgical History:   Past Surgical History:  Procedure Laterality Date   CARDIOVERSION N/A 11/08/2013   Procedure: CARDIOVERSION;  Surgeon: Redell GORMAN Shallow, MD;  Location: [facility] ENDOSCOPY;  Service: Cardiovascular;  Laterality: N/A;   CHOLECYSTECTOMY N/A 11/13/2021   Procedure: LAPAROSCOPIC CHOLECYSTECTOMY;  Surgeon: Kallie Manuelita BROCKS, MD;  Location: [facility] ORS;  Service: General;  Laterality: N/A;    COLONOSCOPY  2007   COLONOSCOPY W/ POLYPECTOMY  2009   EXCISION OF KELOID N/A 05/18/2014   Procedure: EXCISION OF KELOID;  Surgeon: Debby Shipper, MD;  Location: [facility];  Service: General;  Laterality: N/A;   PORT-A-CATH REMOVAL Right 05/18/2014   Procedure: REMOVAL PORT-A-CATH;  Surgeon: Debby Shipper, MD;  Location: [facility];  Service: General;  Laterality: Right;   PORTACATH PLACEMENT Right 02/17/2013   Procedure: INSERTION PORT-A-CATH;  Surgeon: Debby LABOR. Cornett, MD;  Location: [facility];  Service: General;  Laterality: Right;   TEE WITHOUT CARDIOVERSION N/A 11/08/2013   Procedure: TRANSESOPHAGEAL ECHOCARDIOGRAM (TEE);  Surgeon: Redell GORMAN Shallow, MD;  Location: [facility] ENDOSCOPY;  Service: Cardiovascular;  Laterality: N/A;   TOTAL KNEE ARTHROPLASTY Left 2003   TOTAL THYROIDECTOMY     TUBAL LIGATION  1985     Social History:   reports that she quit smoking about 32 years ago. Her smoking use included cigarettes. She started smoking about 52 years ago. She has a 5 pack-year smoking history. She has been exposed to tobacco smoke. She has never used smokeless tobacco. She reports that she does not drink alcohol  and does not use drugs.   Family History:  Her family history includes Asthma in her sister; Diabetes in her mother and sister; Fibromyalgia in her daughter; Heart disease in her father; Hypertension in her daughter, daughter, and sister; Lung cancer in her father; Rheum arthritis in her daughter.   Allergies Allergies  Allergen Reactions   Procardia [Nifedipine] Other (See Comments)    MIGRAINES   Aspirin      Unable to take due to blood pressure medication   Diltiazem  Itching     Home Medications  Prior to Admission medications   Medication Sig Start Date End Date Taking? Authorizing Provider  albuterol  (PROVENTIL ) (2.5 MG/3ML) 0.083% nebulizer solution USE 1 VIAL IN NEBULIZER EVERY 6 HOURS AS NEEDED FOR WHEEZING OR SHORTNESS OF  BREATH. Patient taking differently: Take 2.5 mg by nebulization every 6 (six) hours as needed for wheezing or shortness of breath. 02/06/21   Cook, Jayce G, DO  amLODipine  (NORVASC ) 10 MG tablet TAKE (1) TABLET BY MOUTH ONCE DAILY. 06/23/23   Cook, Jayce G, DO  apixaban  (ELIQUIS ) 5 MG TABS tablet Take 1 tablet (5 mg total) by mouth 2 (two) times daily. 08/19/23   Cook, Jayce G, DO  benazepril  (LOTENSIN ) 40 MG tablet TAKE (1) TABLET BY MOUTH ONCE DAILY. 06/09/23   Cook, Jayce G, DO  carvedilol  (COREG ) 12.5 MG tablet TAKE 1 TABLET BY MOUTH 2 TIMES DAILY WITH A MEAL. 08/24/23   Cook, Jayce G, DO  Continuous Glucose Sensor (DEXCOM G7 SENSOR) MISC USE TO MONITOR BLOOD SUGAR. CHANGE EVERY 10 DAYS. 07/21/23   [provider]  dapagliflozin  propanediol (FARXIGA ) 5 MG TABS tablet Take 1 tablet (5 mg total) by mouth daily. 09/23/23   Cook, Jayce G, DO  Diclofenac  Sodium 3 % GEL SMARTSIG:2-4 Gram(s) Topical Twice Daily    [provider]  DULoxetine  (CYMBALTA ) 60 MG capsule Take 1 capsule by mouth daily. 04/30/22 08/19/23  [provider]  fluticasone  (FLOVENT  HFA) 44 MCG/ACT inhaler Inhale 2 puffs into the lungs 2 (two) times daily. 06/08/18   Alphonsa Elsie RAMAN, MD  glucose blood (ONETOUCH ULTRA) test strip USE TO TEST BLOOD SUGAR 4 TIMES DAILY AS DIRECTED. 05/29/23   Cook, Jayce G, DO  HUMALOG MIX 75/25 KWIKPEN (75-25) 100 UNIT/ML Kwikpen Inject 12 Units into the skin in the morning and at bedtime. 09/24/15   [provider]  Insulin  Pen Needle 31G X 8 MM MISC Use to inject insulin  one time daily 02/21/13   [provider]  levothyroxine  (SYNTHROID ) 100 MCG tablet Take 100 mcg by mouth daily before breakfast.    [provider]  MOUNJARO 10 MG/0.5ML Pen Inject 10 mg into the skin once a week.    [provider]  polyethylene glycol (MIRALAX  / GLYCOLAX ) 17 g packet Take 17 g by mouth daily as needed for mild constipation. 09/04/21   Maree, Pratik D, DO  pravastatin   (PRAVACHOL ) 40 MG tablet TAKE ONE TABLET BY MOUTH AT BEDTIME 09/10/23   Cook, Jayce G, DO  pregabalin  (LYRICA ) 100 MG capsule Take 100 mg by mouth 3 (three) times daily. 07/27/23   [provider]  traMADol  (ULTRAM ) 50 MG tablet Take 1-1.5 tablets (50-75 mg total) by mouth every 8 (eight) hours as needed for moderate pain (pain score 4-6) or severe pain (pain score 7-10). 08/19/23   Cook, Jayce G, DO  triamcinolone  cream (KENALOG ) 0.1 % APPLY TO AFFECTED AREAS TWICE DAILY PRN 08/27/23   Mauro Elveria BROCKS, NP  VENTOLIN  HFA 108 (90 Base) MCG/ACT inhaler INHALE 2 PUFFS INTO THE LUNGS EVERY 6 HOURS AS NEEDED FOR SHORTNESS OF BREATH OR WHEEZING. 11/24/22   Cook, Jayce G, DO     Critical care time: 50 mins      Lyle Pesa, NP [facility] 09/28/2023, 11:31 AM  See Amion for pager If no response to pager , please call [phone] until 7pm After 7:00 pm call Elink  [phone]

## 2023-09-29 DIAGNOSIS — N179 Acute kidney failure, unspecified: Secondary | ICD-10-CM

## 2023-09-29 DIAGNOSIS — I4892 Unspecified atrial flutter: Secondary | ICD-10-CM

## 2023-09-29 DIAGNOSIS — Z9911 Dependence on respirator [ventilator] status: Secondary | ICD-10-CM

## 2023-09-29 LAB — BASIC METABOLIC PANEL WITH GFR
Anion gap: 17 — ABNORMAL HIGH (ref 5–15)
BUN: 19 mg/dL (ref 8–23)
CO2: 19 mmol/L — ABNORMAL LOW (ref 22–32)
Calcium: 8.9 mg/dL (ref 8.9–10.3)
Chloride: 100 mmol/L (ref 98–111)
Creatinine, Ser: 1.31 mg/dL — ABNORMAL HIGH (ref 0.44–1.00)
GFR, Estimated: 44 mL/min — ABNORMAL LOW (ref >60)
Glucose, Bld: 212 mg/dL — ABNORMAL HIGH (ref 70–99)
Potassium: 4.3 mmol/L (ref 3.5–5.1)
Sodium: 136 mmol/L (ref 135–145)

## 2023-09-29 LAB — BETA-HYDROXYBUTYRIC ACID: Beta-Hydroxybutyric Acid: 0.29 mmol/L — ABNORMAL HIGH (ref 0.05–0.27)

## 2023-09-29 LAB — GLUCOSE, CAPILLARY
Glucose-Capillary: 178 mg/dL — ABNORMAL HIGH (ref 70–99)
Glucose-Capillary: 234 mg/dL — ABNORMAL HIGH (ref 70–99)
Glucose-Capillary: 245 mg/dL — ABNORMAL HIGH (ref 70–99)
Glucose-Capillary: 217 mg/dL — ABNORMAL HIGH (ref 70–99)
Glucose-Capillary: 217 mg/dL — ABNORMAL HIGH (ref 70–99)
Glucose-Capillary: 231 mg/dL — ABNORMAL HIGH (ref 70–99)

## 2023-09-29 LAB — CBC
HCT: 39.4 % (ref 36.0–46.0)
Hemoglobin: 12.6 g/dL (ref 12.0–15.0)
MCH: 27.3 pg (ref 26.0–34.0)
MCHC: 32 g/dL (ref 30.0–36.0)
MCV: 85.5 fL (ref 80.0–100.0)
Platelets: 257 K/uL (ref 150–400)
RBC: 4.61 MIL/uL (ref 3.87–5.11)
RDW: 15.9 % — ABNORMAL HIGH (ref 11.5–15.5)
WBC: 10.2 K/uL (ref 4.0–10.5)
nRBC: 0 % (ref 0.0–0.2)

## 2023-09-29 LAB — TRIGLYCERIDES: Triglycerides: 196 mg/dL — ABNORMAL HIGH (ref <150)

## 2023-09-29 LAB — MAGNESIUM: Magnesium: 2.4 mg/dL (ref 1.7–2.4)

## 2023-09-29 LAB — C4 COMPLEMENT: Complement C4, Body Fluid: 49 mg/dL — ABNORMAL HIGH (ref 12–38)

## 2023-09-29 MED ORDER — POLYETHYLENE GLYCOL 3350 17 G PO PACK
17.0000 g | PACK | Freq: Every day | ORAL | Status: DC
  Administered 2023-09-30: 17 g via ORAL
  Filled 2023-09-29: qty 1

## 2023-09-29 MED ORDER — APIXABAN 5 MG PO TABS
5.0000 mg | ORAL_TABLET | Freq: Two times a day (BID) | ORAL | Status: DC
  Administered 2023-09-29 – 2023-09-30 (×2): 5 mg via ORAL
  Filled 2023-09-29 (×2): qty 1

## 2023-09-29 MED ORDER — INSULIN GLARGINE 100 UNIT/ML ~~LOC~~ SOLN
8.0000 [IU] | Freq: Two times a day (BID) | SUBCUTANEOUS | Status: DC
  Administered 2023-09-29 – 2023-09-30 (×2): 8 [IU] via SUBCUTANEOUS
  Filled 2023-09-29 (×3): qty 0.08

## 2023-09-29 MED ORDER — LABETALOL HCL 5 MG/ML IV SOLN
20.0000 mg | INTRAVENOUS | Status: DC | PRN
Start: 2023-09-29 — End: 2023-09-30
  Administered 2023-09-29 – 2023-09-30 (×2): 20 mg via INTRAVENOUS
  Filled 2023-09-29 (×2): qty 4

## 2023-09-29 MED ORDER — DEXMEDETOMIDINE HCL IN NACL 200 MCG/50ML IV SOLN
0.0000 ug/kg/h | INTRAVENOUS | Status: DC
  Administered 2023-09-29: 0.4 ug/kg/h via INTRAVENOUS
  Filled 2023-09-29: qty 50

## 2023-09-29 MED ORDER — PRAVASTATIN SODIUM 20 MG PO TABS
40.0000 mg | ORAL_TABLET | Freq: Every day | ORAL | Status: DC
  Administered 2023-09-29: 40 mg via ORAL
  Filled 2023-09-29: qty 2

## 2023-09-29 MED ORDER — DULOXETINE HCL 30 MG PO CPEP: 60.0000 mg | ORAL_CAPSULE | Freq: Every day | ORAL | Status: DC

## 2023-09-29 MED ORDER — HYDRALAZINE HCL 20 MG/ML IJ SOLN
10.0000 mg | INTRAMUSCULAR | Status: DC | PRN
  Filled 2023-09-29: qty 1

## 2023-09-29 MED ORDER — LACTATED RINGERS IV BOLUS
500.0000 mL | Freq: Once | INTRAVENOUS | Status: AC
  Administered 2023-09-29: 500 mL via INTRAVENOUS

## 2023-09-29 MED ORDER — PRAVASTATIN SODIUM 20 MG PO TABS: 40.0000 mg | ORAL_TABLET | Freq: Every day | ORAL | Status: DC

## 2023-09-29 MED ORDER — POLYVINYL ALCOHOL 1.4 % OP SOLN
1.0000 [drp] | OPHTHALMIC | Status: DC | PRN
  Administered 2023-09-29: 1 [drp] via OPHTHALMIC
  Filled 2023-09-29: qty 15

## 2023-09-29 MED ORDER — INSULIN ASPART 100 UNIT/ML IJ SOLN
0.0000 [IU] | INTRAMUSCULAR | Status: DC
  Administered 2023-09-29 (×3): 7 [IU] via SUBCUTANEOUS
  Administered 2023-09-30: 11 [IU] via SUBCUTANEOUS
  Administered 2023-09-30 (×2): 7 [IU] via SUBCUTANEOUS
  Administered 2023-09-30: 4 [IU] via SUBCUTANEOUS
  Administered 2023-09-30: 7 [IU] via SUBCUTANEOUS

## 2023-09-29 MED ORDER — CARVEDILOL 12.5 MG PO TABS
12.5000 mg | ORAL_TABLET | Freq: Two times a day (BID) | ORAL | Status: DC
  Administered 2023-09-30 (×2): 12.5 mg via ORAL
  Filled 2023-09-29 (×2): qty 1

## 2023-09-29 MED ORDER — ACETAMINOPHEN 325 MG PO TABS
650.0000 mg | ORAL_TABLET | Freq: Four times a day (QID) | ORAL | Status: DC | PRN
  Administered 2023-09-30: 650 mg via ORAL
  Filled 2023-09-29: qty 2

## 2023-09-29 MED ORDER — DOCUSATE SODIUM 50 MG/5ML PO LIQD
100.0000 mg | Freq: Two times a day (BID) | ORAL | Status: DC
  Administered 2023-09-29 – 2023-09-30 (×2): 100 mg via ORAL
  Filled 2023-09-29 (×2): qty 10

## 2023-09-29 NOTE — Procedures (Signed)
 Extubation Procedure Note  Patient Details:   Name: [name] DOB: 1955-07-02 MRN: [id number]   Airway Documentation:    Vent end date: 09/29/23 Vent end time: 1049   Evaluation  O2 sats: stable throughout Complications: No apparent complications Patient did tolerate procedure well. Bilateral Breath Sounds: Clear, Diminished   Yes  Patient tolerated wean. Positive for cuff leak. Patient extubated per order to a 4 Lpm Central. No signs of dyspnea or stridor noted. Daughter and RN at bedside. Patient resting comfortably.   Estell Sailors 09/29/2023, 11:10 AM

## 2023-09-29 NOTE — Progress Notes (Addendum)
 Performed swallow eval with patient. Patient able to swallow liquid with no adverse reaction at this time. Requested clear liquid diet for patient. See orders

## 2023-09-29 NOTE — Progress Notes (Signed)
 I met with Lenora and her daughters.  She is in good spirits today and is so grateful to be feeling better, to be able to speak and breathe without difficulty.  She was appreciative that I had prayed with her daughters yesterday and requested prayer again today.

## 2023-09-29 NOTE — Inpatient Diabetes Management (Signed)
 Inpatient Diabetes Program Recommendations  AACE/ADA: New Consensus Statement on Inpatient Glycemic Control (2015)  Target Ranges:  Prepandial:   less than 140 mg/dL      Peak postprandial:   less than 180 mg/dL (1-2 hours)      Critically ill patients:  140 - 180 mg/dL   Lab Results  Component Value Date   GLUCAP 234 (H) 09/29/2023   HGBA1C 8.1 (H) 08/19/2023    Review of Glycemic Control  Latest Reference Range & Units 09/28/23 12:34 09/28/23 15:49 09/28/23 19:23 09/28/23 23:27 09/29/23 03:38 09/29/23 07:39  Glucose-Capillary 70 - 99 mg/dL 753 (H) 779 (H) 794 (H) 233 (H) 178 (H) 234 (H)  (H): Data is abnormally high  Diabetes history: DM2 Outpatient Diabetes medications: 75/25-12 units BID, Mounjaro 10 mg weekly, Farxiga  5 mg every day Current orders for Inpatient glycemic control: Lantus  5 units BID, Novolog  0-15 units Q4H, Solumedrol 40 mg Q12H  Inpatient Diabetes Program Recommendations:    Please consider increasing basal insulin :  Lantus  8 units BID  Thank you, Wyvonna Pinal, MSN, CDCES Diabetes Coordinator Inpatient Diabetes Program [phone] (team pager from 8a-5p)

## 2023-09-29 NOTE — Progress Notes (Addendum)
 NAME:  [name], MRN:  [id number], DOB:  [birth year], LOS: 1 ADMISSION DATE:  09/28/2023, CONSULTATION DATE:  09/28/23 REFERRING MD:  Dr. Charlyn, CHIEF COMPLAINT:  throat swelling   History of Present Illness:  HPI obtained from EMR as pt remains encephalopathic and per daughters at bedside.   30 yoM with PMH as below, significant for Graves disease with postoperative hypothyroidism, atrial flutter on eliquis , HTN, HLD, DMT2, asthma, OSA, arthritis, chronic pain, breast cancer who presented to [facility] ER with complaints of swelling to her tongue and floor of mouth and SOB with EMS.  Pt with prior hx of unilateral tongue swelling per daughters, no rash, fever, or URI, otherwise in her normal state of health yesterday.  This was her third episode per daughters, first in 11/2022 and second in 01/2023, both episodes did not require ER evaluation.  Improved at home with popsicles and benadryl . She followed up with her endocrine doctor, felt attributed then possibly to the blue dye in her thyroid  medication in which she switched to different med in January.  Recently switched back to original thyroid  with blue dye in August.  No known family hx of angioedema per daughters and otherwise no known issues with other blue dye in foods.  Recent new medication of farxiga  but otherwise no new meds.  Has been on benazepril  for several years.  At baseline is non weight bearing due to severe osteoarthritis and pain, is wheelchair bound.    She was treated with IM epi with EMS.  On arrival to ER, noted to have significant tongue and submental right greater than left swelling, with concern for Ludwig's angina vs angioedema.  Pt electively intubated for airway protection.  On intubation, swelling noted down to glottis.  Steroids and empiric broad spectrum abx started.  Further workup noted normal lactic, WBC, and ESR more suggestive of angioedema.  Treated with labetalol  prn and placed on propofol  and prn fentanyl  for  sedation.  CT maxillofacial ordered but not completed yet. Transferred to [facility] ICU for further care and evaluation, PCCM accepting.   Pertinent  Medical History   Past Medical History:  Diagnosis Date   Asthma    prn neb. and inhaler   Breast cancer (HCC) 01/13/2013   right   Dehydration 04/13/2013   GERD (gastroesophageal reflux disease)    Graves' disease    Hyperlipidemia    Hypertension    on multiple meds., has been on med. > 14 yr.   Non-insulin  dependent type 2 diabetes mellitus (HCC)    Obesity    Ophthalmic manifestation of Graves disease    Osteoarthritis 01/13/2001   left knee   Radiation 10/11/13-11/29/13   Right Breast   Sleep apnea 04/14/2014   mod  , wears cpap 10 , ramps from 4 , nasal mask   Wears glasses    Significant Hospital Events: Including procedures, antibiotic start and stop dates in addition to other pertinent events   9/15: tx [facility] ER intubated to [facility] for angioedema  Interim History / Subjective:  Patient remains intubated and sedated. Nods appropriately. Denies pain. Tongue swelling significantly improved.  Objective    Blood pressure (!) 154/95, pulse 84, temperature 99.4 F (37.4 C), temperature source Axillary, resp. rate 20, height 5' 4 (1.626 m), weight (!) 152.5 kg, SpO2 100%.    Vent Mode: CPAP;PSV FiO2 (%):  [40 %] 40 % Set Rate:  [20 bmp] 20 bmp Vt Set:  [430 mL] 430 mL PEEP:  [5 cmH20] 5  cmH20 Pressure Support:  [5 cmH20-10 cmH20] 5 cmH20 Plateau Pressure:  [15 cmH20-16 cmH20] 16 cmH20   Intake/Output Summary (Last 24 hours) at 09/29/2023 0956 Last data filed at 09/29/2023 0700 Gross per 24 hour  Intake 2189.6 ml  Output 1845 ml  Net 344.6 ml   Filed Weights   09/28/23 0452 09/28/23 1000 09/29/23 0500  Weight: (!) 146 kg (!) 142.4 kg (!) 152.5 kg    Examination: On propofol  15 General: morbidly obese female, intubated and sedated, no acute distress HEENT: MM pink/moist, mild tongue swelling, +cuff leak  Neuro: follows  commands throughout on light sedation, non-focal  CV: regular rate and rhythm, normal S1 and S2, no m/r/g PULM: on mechanical ventilation, clear to auscultation bilaterally, diminished bilateral bases  GI: soft, non-distended, normoactive bowel sounds  Extremities: warm, palpable pedal pulses, no edema Skin: no rashes or lesions  Labs reviewed  Resolved problem list   Assessment and Plan   Angioedema, unclear etiology at this time, improved. Could be related to ACE-I, vs allergy but no signs of anaphylaxis, hypotension or rash, vs r/o HAE Acute airway insufficiency related to above  Hx Asthma  - cont full LTVV support, 4-8cc/kg IBW with goal Pplat <30 and DP<15  - VAP prevention protocol/ PPI - intermittent CXR/ ABG - wean FiO2 as able for SpO2 >92%  - wean sedation to work towards extubation  - SAT & SBT, given significant improvement in angioedema and positive cuff leak will plan to extubate as able - brovanna, prn albuterol   - cont solumedrol 40mg  BID, pepcid . Consider benadryl  prn  - C4 compliment mildly elevated at 49, follow-up pending C1 esterase inhibitor. Slightly elevated ESR/ CRP could be related to underlying severe arthritis   HTN HLD - would hold benazepril , avoid any ACE-I given angioedema - continue home coreg , holding home norvasc  for now - will resume as blood pressure allows   Atrial flutter PVCs On Eliquis  and compliant per daughters. Remains in SR-ST with frequent PVCs - continue tele monitoring  - cont home coreg  per above - cont eliquis  5mg  BID - optimize electrolytes  AKI, likely pre-renal in the setting of poor PO intake - will give 500 cc LR bolus - continue to monitor daily BMP - avoid nephrotoxins, renally dose medications  Hypothyroidism - cont home synthroid , will discuss with pharmacy to avoid any dyes  T2DM - CBG q 4, prn SSI - increase glargine to 8 units BID  Osteoarthritis/ chronic pain  Chronic immobility  - resume Cymbalta  when  able to take PO - cont lyrica , hold hold tramadol   Obesity, BMI 55 - outpt weight loss counseling   Nutrition - wean to extubate and beside swallow as able, if unable to extubate will need tube feeds  Labs   CBC: Recent Labs  Lab 09/28/23 0534 09/29/23 0550  WBC 8.3 10.2  NEUTROABS 4.7  --   HGB 13.9 12.6  HCT 42.2 39.4  MCV 85.4 85.5  PLT 267 257    Basic Metabolic Panel: Recent Labs  Lab 09/28/23 0534 09/28/23 1617 09/29/23 0550  NA 137 135 136  K 3.3* 4.6 4.3  CL 100 99 100  CO2 23 21* 19*  GLUCOSE 180* 231* 212*  BUN 13 15 19   CREATININE 0.88 1.02* 1.31*  CALCIUM  9.0 8.9 8.9  MG 2.0 1.9 2.4   GFR: Estimated Creatinine Clearance: 60 mL/min (A) (by C-G formula based on SCr of 1.31 mg/dL (H)). Recent Labs  Lab 09/28/23 0534 09/29/23 0550  WBC 8.3 10.2  LATICACIDVEN 1.3  --     Liver Function Tests: Recent Labs  Lab 09/28/23 0534  AST 22  ALT 14  ALKPHOS 95  BILITOT 0.6  PROT 8.7*  ALBUMIN 4.2   No results for input(s): LIPASE, AMYLASE in the last 168 hours. No results for input(s): AMMONIA in the last 168 hours.  ABG    Component Value Date/Time   PHART 7.48 (H) 09/28/2023 0552   PCO2ART 35 09/28/2023 0552   PO2ART 129 (H) 09/28/2023 0552   HCO3 26.9 09/28/2023 1034   TCO2 27.8 05/02/2013 1440   O2SAT 36.5 09/28/2023 1034     Coagulation Profile: Recent Labs  Lab 09/28/23 0534  INR 1.1    Cardiac Enzymes: No results for input(s): CKTOTAL, CKMB, CKMBINDEX, TROPONINI in the last 168 hours.  HbA1C: Hgb A1c MFr Bld  Date/Time Value Ref Range Status  08/19/2023 12:10 PM 8.1 (H) 4.8 - 5.6 % Final    Comment:             Prediabetes: 5.7 - 6.4          Diabetes: >6.4          Glycemic control for adults with diabetes: <7.0   11/06/2021 12:27 PM 7.7 (H) 4.8 - 5.6 % Final    Comment:             Prediabetes: 5.7 - 6.4          Diabetes: >6.4          Glycemic control for adults with diabetes: <7.0      CBG: Recent Labs  Lab 09/28/23 1549 09/28/23 1923 09/28/23 2327 09/29/23 0338 09/29/23 0739  GLUCAP 220* 205* 233* 178* 234*      The patient is critically ill with multiple organ system failure and requires high complexity decision making for assessment and support, frequent evaluation and titration of therapies, advanced monitoring, review of radiographic studies and interpretation of complex data.   Critical Care Time devoted to patient care services, exclusive of separately billable procedures, described in this note is 30 minutes.  Rexene LOISE Blush, PA-C [facility] 09/29/23 11:09 AM  Please see [email or web address] for pager details.  From 7A-7P if no response, please call [phone] After hours, please call ELink [phone]

## 2023-09-30 ENCOUNTER — Other Ambulatory Visit (HOSPITAL_BASED_OUTPATIENT_CLINIC_OR_DEPARTMENT_OTHER): Payer: Self-pay

## 2023-09-30 ENCOUNTER — Other Ambulatory Visit (HOSPITAL_COMMUNITY): Payer: Self-pay

## 2023-09-30 DIAGNOSIS — T783XXD Angioneurotic edema, subsequent encounter: Secondary | ICD-10-CM

## 2023-09-30 LAB — CBC
HCT: 35.5 % — ABNORMAL LOW (ref 36.0–46.0)
Hemoglobin: 11.6 g/dL — ABNORMAL LOW (ref 12.0–15.0)
MCH: 27.4 pg (ref 26.0–34.0)
MCHC: 32.7 g/dL (ref 30.0–36.0)
MCV: 83.9 fL (ref 80.0–100.0)
Platelets: 251 K/uL (ref 150–400)
RBC: 4.23 MIL/uL (ref 3.87–5.11)
RDW: 15.9 % — ABNORMAL HIGH (ref 11.5–15.5)
WBC: 10.1 K/uL (ref 4.0–10.5)
nRBC: 0 % (ref 0.0–0.2)

## 2023-09-30 LAB — BASIC METABOLIC PANEL WITH GFR
Anion gap: 14 (ref 5–15)
BUN: 32 mg/dL — ABNORMAL HIGH (ref 8–23)
CO2: 22 mmol/L (ref 22–32)
Calcium: 8.6 mg/dL — ABNORMAL LOW (ref 8.9–10.3)
Chloride: 102 mmol/L (ref 98–111)
Creatinine, Ser: 1.29 mg/dL — ABNORMAL HIGH (ref 0.44–1.00)
GFR, Estimated: 45 mL/min — ABNORMAL LOW (ref >60)
Glucose, Bld: 233 mg/dL — ABNORMAL HIGH (ref 70–99)
Potassium: 4.6 mmol/L (ref 3.5–5.1)
Sodium: 138 mmol/L (ref 135–145)

## 2023-09-30 LAB — GLUCOSE, CAPILLARY
Glucose-Capillary: 210 mg/dL — ABNORMAL HIGH (ref 70–99)
Glucose-Capillary: 170 mg/dL — ABNORMAL HIGH (ref 70–99)
Glucose-Capillary: 281 mg/dL — ABNORMAL HIGH (ref 70–99)
Glucose-Capillary: 236 mg/dL — ABNORMAL HIGH (ref 70–99)

## 2023-09-30 LAB — C1 ESTERASE INHIBITOR: C1INH SerPl-mCnc: 51 mg/dL — ABNORMAL HIGH (ref 21–39)

## 2023-09-30 LAB — MAGNESIUM: Magnesium: 2.5 mg/dL — ABNORMAL HIGH (ref 1.7–2.4)

## 2023-09-30 LAB — PHOSPHORUS: Phosphorus: 4.3 mg/dL (ref 2.5–4.6)

## 2023-09-30 MED ORDER — PREDNISONE 10 MG PO TABS
30.0000 mg | ORAL_TABLET | Freq: Every day | ORAL | 0 refills | Status: DC
  Filled 2023-09-30: qty 9, 3d supply, fill #0

## 2023-09-30 MED ORDER — PREGABALIN 75 MG PO CAPS
100.0000 mg | ORAL_CAPSULE | Freq: Three times a day (TID) | ORAL | Status: DC
  Administered 2023-09-30: 100 mg via ORAL
  Filled 2023-09-30: qty 1

## 2023-09-30 MED ORDER — AMLODIPINE BESYLATE 10 MG PO TABS
10.0000 mg | ORAL_TABLET | Freq: Every day | ORAL | Status: DC
  Administered 2023-09-30: 10 mg via ORAL
  Filled 2023-09-30: qty 1

## 2023-09-30 MED ORDER — PREDNISONE 10 MG PO TABS
30.0000 mg | ORAL_TABLET | Freq: Every day | ORAL | 0 refills | Status: AC
Start: 2023-09-30 — End: 2023-10-03
  Filled 2023-09-30: qty 9, 3d supply, fill #0

## 2023-09-30 MED ORDER — BOOST / RESOURCE BREEZE PO LIQD CUSTOM
1.0000 | Freq: Three times a day (TID) | ORAL | Status: DC
  Administered 2023-09-30: 1 via ORAL

## 2023-09-30 NOTE — Progress Notes (Signed)
 eLink Physician-Brief Progress Note Patient Name: [name] DOB: 08-06-1955 MRN: [id number]   Date of Service  09/30/2023  HPI/Events of Note   GRAM POSITIVE RODS AEROBIC BOTTLE ONLY  Blood cultures drawn at any [facility]-no evidence of sepsis, shock, or infectious signs or symptoms  eICU Interventions  Likely contaminant, continue observation     Intervention Category Intermediate Interventions: Infection - evaluation and management  Joselle Deeds 09/30/2023, 5:52 AM

## 2023-09-30 NOTE — Evaluation (Signed)
 Physical Therapy Evaluation Patient Details Name: GRACLYN LAWTHER MRN: [id number] DOB: [birth year] Today's Date: 09/30/2023  History of Present Illness  Stephania Macfarlane is a 68 y/o female who presents for angioedema requiring invasive mechanical ventilation due to upper airway swelling. Grave's disease c/b postoperative hypothyroidism, atrial flutter on eliquis , HTN, HLD, DM, and recurrent episodes of angioedema  Clinical Impression  Pt admitted with above diagnosis.  Pt currently with functional limitations due to the deficits listed below (see PT Problem List). Pt will benefit from acute skilled PT to increase their independence and safety with mobility to allow discharge.     The patient is very pleasant and able to provide information for PLOF.  Patient reports family uses Hoyer lift to transfer to  [employer]. Patient is able to welf propel Wc in her home, does not transfer to a commode.   Patient tolerated mobilizing to  sitting with mod assist, sat x ~ 10 minutes with supervision, assisted back into bed. Recommend HHPT at DC to ensure  continued mobility and strength.      If plan is discharge home, recommend the following: Two people to help with walking and/or transfers;A lot of help with bathing/dressing/bathroom;Assistance with cooking/housework;Assist for transportation;Help with stairs or ramp for entrance   Can travel by private vehicle        Equipment Recommendations None recommended by PT  Recommendations for Other Services       Functional Status Assessment Patient has had a recent decline in their functional status and/or demonstrates limited ability to make significant improvements in function in a reasonable and predictable amount of time     Precautions / Restrictions Precautions Precautions: Fall Restrictions Weight Bearing Restrictions Per Provider Order: No      Mobility  Bed Mobility Overal bed mobility: Needs Assistance Bed Mobility:  Rolling Rolling: Mod assist, Used rails   Supine to sit: Mod assist, +2 for safety/equipment, HOB elevated, Used rails Sit to supine: Max assist, +2 for physical assistance, +2 for safety/equipment   General bed mobility comments: patient able to slide legs to bed edge and used rails to pull to sitting, abl e to assist with scooting buttocks  to bed edge, bed pad used some, max assist with legs back onto bed. Patient able to roll to the  right, can move the  left leg  to facilitate roll, use of bed rail.    Transfers                   General transfer comment: will need lift equipment    Ambulation/Gait                  Stairs            Wheelchair Mobility     Tilt Bed    Modified Rankin (Stroke Patients Only)       Balance Overall balance assessment: Needs assistance Sitting-balance support: Feet supported, No upper extremity supported Sitting balance-Leahy Scale: Fair                                       Pertinent Vitals/Pain Pain Assessment Pain Assessment: Faces Faces Pain Scale: Hurts even more Pain Location: L lower leg Pain Descriptors / Indicators: Grimacing, Guarding, Discomfort Pain Intervention(s): Monitored during session, Limited activity within patient's tolerance    Home Living Family/patient expects to be discharged to:: Private residence Living  Arrangements: Children Available Help at Discharge: Available 24 hours/day Type of Home: House Home Access: Ramped entrance       Home Layout: One level Home Equipment: Wheelchair - manual Additional Comments: Hoyer    Prior Function Prior Level of Function : Needs assist             Mobility Comments: nonambulatory, family Hoyers OOB to Wc, can self propel Wc, Spnge bath, uses depends for toileting.       Extremity/Trunk Assessment        Lower Extremity Assessment Lower Extremity Assessment: RLE deficits/detail;LLE deficits/detail RLE Deficits /  Details: limited knee flex( post failed TKA per pt), able to slide leg to bed edge,  cannot lift from bed RLE: Unable to fully assess due to pain LLE Deficits / Details: unable to lift leg, can slide to bed edge    Cervical / Trunk Assessment Cervical / Trunk Assessment: Normal;Other exceptions Cervical / Trunk Exceptions: body habitus  Communication   Communication Communication: No apparent difficulties    Cognition Arousal: Alert Behavior During Therapy: WFL for tasks assessed/performed   PT - Cognitive impairments: No apparent impairments                         Following commands: Intact       Cueing       General Comments      Exercises     Assessment/Plan    PT Assessment Patient needs continued PT services  PT Problem List Decreased strength;Decreased range of motion;Decreased activity tolerance;Decreased balance;Pain;Decreased mobility       PT Treatment Interventions Functional mobility training;Therapeutic activities;Therapeutic exercise;Patient/family education    PT Goals (Current goals can be found in the Care Plan section)  Acute Rehab PT Goals Patient Stated Goal: go home PT Goal Formulation: With patient Time For Goal Achievement: 10/14/23 Potential to Achieve Goals: Good    Frequency Min 2X/week     Co-evaluation PT/OT/SLP Co-Evaluation/Treatment: Yes Reason for Co-Treatment: To address functional/ADL transfers PT goals addressed during session: Mobility/safety with mobility OT goals addressed during session: ADL's and self-care       AM-PAC PT 6 Clicks Mobility  Outcome Measure                  End of Session   Activity Tolerance: Patient tolerated treatment well Patient left: in bed;with call bell/phone within reach Nurse Communication: Mobility status PT Visit Diagnosis: Unsteadiness on feet (R26.81);Muscle weakness (generalized) (M62.81);Pain Pain - Right/Left: Left Pain - part of body: Leg    Time:  8971-8893 PT Time Calculation (min) (ACUTE ONLY): 38 min   Charges:   PT Evaluation $PT Eval Low Complexity: 1 Low PT Treatments $Therapeutic Activity: 8-22 mins PT General Charges $$ ACUTE PT VISIT: 1 Visit         Darice Potters PT Acute Rehabilitation Services Office [phone]   Potters Darice Norris 09/30/2023, 1:31 PM

## 2023-09-30 NOTE — Plan of Care (Signed)
  Problem: Education: Goal: Knowledge of General Education information will improve Description: Including pain rating scale, medication(s)/side effects and non-pharmacologic comfort measures Outcome: Adequate for Discharge   Problem: Health Behavior/Discharge Planning: Goal: Ability to manage health-related needs will improve Outcome: Adequate for Discharge   Problem: Clinical Measurements: Goal: Ability to maintain clinical measurements within normal limits will improve Outcome: Adequate for Discharge Goal: Will remain free from infection Outcome: Adequate for Discharge Goal: Diagnostic test results will improve Outcome: Adequate for Discharge Goal: Respiratory complications will improve Outcome: Adequate for Discharge Goal: Cardiovascular complication will be avoided Outcome: Adequate for Discharge   Problem: Activity: Goal: Risk for activity intolerance will decrease Outcome: Adequate for Discharge   Problem: Nutrition: Goal: Adequate nutrition will be maintained Outcome: Adequate for Discharge   Problem: Coping: Goal: Level of anxiety will decrease Outcome: Adequate for Discharge   Problem: Elimination: Goal: Will not experience complications related to bowel motility Outcome: Adequate for Discharge Goal: Will not experience complications related to urinary retention Outcome: Adequate for Discharge   Problem: Pain Managment: Goal: General experience of comfort will improve and/or be controlled Outcome: Adequate for Discharge   Problem: Safety: Goal: Ability to remain free from injury will improve Outcome: Adequate for Discharge   Problem: Skin Integrity: Goal: Risk for impaired skin integrity will decrease Outcome: Adequate for Discharge   Problem: Activity: Goal: Ability to tolerate increased activity will improve Outcome: Adequate for Discharge   Problem: Respiratory: Goal: Ability to maintain a clear airway and adequate ventilation will  improve Outcome: Adequate for Discharge   Problem: Role Relationship: Goal: Method of communication will improve Outcome: Adequate for Discharge   Problem: Education: Goal: Ability to describe self-care measures that may prevent or decrease complications (Diabetes Survival Skills Education) will improve Outcome: Adequate for Discharge Goal: Individualized Educational Video(s) Outcome: Adequate for Discharge   Problem: Coping: Goal: Ability to adjust to condition or change in health will improve Outcome: Adequate for Discharge   Problem: Fluid Volume: Goal: Ability to maintain a balanced intake and output will improve Outcome: Adequate for Discharge   Problem: Health Behavior/Discharge Planning: Goal: Ability to identify and utilize available resources and services will improve Outcome: Adequate for Discharge Goal: Ability to manage health-related needs will improve Outcome: Adequate for Discharge   Problem: Metabolic: Goal: Ability to maintain appropriate glucose levels will improve Outcome: Adequate for Discharge   Problem: Nutritional: Goal: Maintenance of adequate nutrition will improve Outcome: Adequate for Discharge Goal: Progress toward achieving an optimal weight will improve Outcome: Adequate for Discharge   Problem: Skin Integrity: Goal: Risk for impaired skin integrity will decrease Outcome: Adequate for Discharge   Problem: Tissue Perfusion: Goal: Adequacy of tissue perfusion will improve Outcome: Adequate for Discharge

## 2023-09-30 NOTE — Discharge Summary (Signed)
 Physician Discharge Summary  DEZARAI PREW FMW:[id number] DOB: [birth year] DOA: 09/28/2023  PCP: Cook, Jayce G, DO  Admit date: 09/28/2023 Discharge date: 09/30/2023  Admitted From: Home  Discharge disposition: Home   Recommendations for Outpatient Follow-Up:   Follow up with your primary care provider in one week.  Check CBC, BMP, magnesium  in the next visit Patient has been discontinued off benazepril  due to concerns for angioedema.  Please consider alternative if needed. C1 esterase inhibitor level pending for outpatient follow-up.  Discharge Diagnosis:   Principal Problem:   Angioedema Active Problems:   On mechanically assisted ventilation (HCC)   AKI (acute kidney injury) (HCC)   Discharge Condition: Improved.  Diet recommendation: Low sodium, heart healthy.    Wound care: None.  Code status: Full.   History of Present Illness:   68 years old female with past medical history of Graves' disease with postop hypothyroidism, atrial flutter on eliquis , hypertension, hyperlipidemia, type 2 diabetes, asthma, OSA, arthritis, chronic pain, breast cancer who initially presented to [facility] with complaints of swelling of her tongue and floor of of her mouth with shortness of breath.   This was her third episode per daughters, first in 11/2022 and second in 01/2023, both episodes did not require ER evaluation.  Improved at home with popsicles and benadryl . She followed up with her endocrine doctor, felt attributed then possibly to the blue dye in her thyroid  medication in which she switched to different med in January.  Recently switched back to original thyroid  with blue dye in August.   Recent new medication of farxiga  but otherwise no new meds.  Has been on benazepril  for several years.  At baseline is non weight bearing due to severe osteoarthritis and pain, is wheelchair bound.  In the ED patient was treated with EpiPen .  She was noted to have significant tongue and  submental swelling and was intubated for airway protection and was admitted to the ICU.  Received IV Solu-Medrol  and broad-spectrum antibiotic initially.  Hospital Course:   Following conditions were addressed during hospitalization as listed below,  Angioedema, uncertain etiology but could be related to ACE-I, no signs of anaphylaxis.   Acute airway insufficiency related to above  Hx Asthma  Initially was intubated and mechanically ventilated.  Currently has been extubated and currently on room air and is comfortable.  Patient received brovanna, prn albuterol , IV Solu-Medrol , Pepcid , Benadryl  during hospitalization.. C4 compliment mildly elevated at 49, follow-up pending C1 esterase inhibitor.    Essential hypertension Hyperlipidemia Will discontinue benazepril , avoid any ACE-I given angioedema Continue Coreg  and Norvasc  on discharge.  Paroxysmal atrial flutter/PVCs Continue Coreg  and Eliquis .  Rate controlled at this time.   AKI, likely pre-renal in the setting of poor PO intake. Received IV fluids.  Latest creatinine of 1.2.  Has improved.  Patient has had good oral intake.  Hypothyroidism Continue Synthroid  on discharge.   Diabetes mellitus type 2. On Humalog 75/25 insulin  at home.  Continue on discharge.  Continue diabetic diet   Osteoarthritis/ chronic pain  Chronic immobility  Continue Lyrica .  Has completed Cymbalta  as outpatient.  Tramadol  as outpatient.   Class III obesity, BMI 55 Patient would benefit from ongoing weight loss as outpatient.   Nutrition Has been advised on soft diet and patient is able to swallow okay.  Disposition.  At this time, patient is stable for disposition home with outpatient PCP follow-up  Medical Consultants:   PCCM  Procedures:    Intubation mechanical ventilation and  subsequent extubation  Subjective:   Today, patient was seen and examined at bedside.  Denies any throat pain, discomfort, dyspnea shortness of breath cough or  chest pain.  Appears to be at her baseline.  Patient's daughter at bedside.  Discharge Exam:   Vitals:   09/30/23 1200 09/30/23 1300  BP: (!) 160/67 (!) 159/71  Pulse: 83 79  Resp: 17 14  Temp: 98.3 F (36.8 C)   SpO2: 96% 99%   Vitals:   09/30/23 0911 09/30/23 1031 09/30/23 1200 09/30/23 1300  BP: (!) 162/61 (!) 178/72 (!) 160/67 (!) 159/71  Pulse: 76  83 79  Resp:   17 14  Temp:   98.3 F (36.8 C)   TempSrc:   Oral   SpO2:   96% 99%  Weight:      Height:       Body mass index is 57.71 kg/m.   General: Alert awake, not in obvious distress, obese built HENT: pupils equally reacting to light,  No scleral pallor or icterus noted. Oral mucosa is moist.  Chest:    Diminished breath sounds bilaterally. No crackles or wheezes.  CVS: S1 &S2 heard. No murmur.  Regular rate and rhythm. Abdomen: Soft, nontender, nondistended.  Bowel sounds are heard.   Extremities: No cyanosis, clubbing or edema.  Peripheral pulses are palpable. Psych: Alert, awake and oriented, normal mood CNS:  No cranial nerve deficits.  Power equal in all extremities.   Skin: Warm and dry.  No rashes noted.  The results of significant diagnostics from this hospitalization (including imaging, microbiology, ancillary and laboratory) are listed below for reference.     Diagnostic Studies:   DG Abd Portable 1V Result Date: 09/28/2023 CLINICAL DATA:  [id number] Encounter for imaging study to confirm orogastric (OG) tube placement [id number] EXAM: PORTABLE ABDOMEN - 1 VIEW COMPARISON:  11/09/2021 FINDINGS: Nonobstructive bowel gas pattern.Esophagogastric tube terminates in the region of the gastric antrum.No pneumoperitoneum. No organomegaly or radiopaque calculi. IMPRESSION: Esophagogastric tube terminates in the region of the gastric antrum. Electronically Signed   By: Rogelia Myers M.D.   On: 09/28/2023 11:10   DG Chest Port 1 View Result Date: 09/28/2023 CLINICAL DATA:  ETT present EXAM: PORTABLE CHEST - 1 VIEW  COMPARISON:  09/28/2023 FINDINGS: Well-positioned endotracheal tube terminating in the mid trachea, unchanged. Low lung volumes. No focal airspace consolidation, pleural effusion, or pneumothorax. Unchanged cardiomegaly. Surgical clips in the upper mediastinum/lower neck. IMPRESSION: 1. Similarly positioned endotracheal tube terminating in the mid trachea. 2. Low lung volumes. Otherwise, no significant interval change to the lungs. Electronically Signed   By: Rogelia Myers M.D.   On: 09/28/2023 10:49   DG Chest Port 1 View Result Date: 09/28/2023 CLINICAL DATA:  Sepsis.  Shortness of breath. EXAM: PORTABLE CHEST 1 VIEW COMPARISON:  11/09/2021 FINDINGS: Endotracheal tube tip is approximately 2.8 cm above the base of the carina. The cardio pericardial silhouette is enlarged. There is pulmonary vascular congestion without overt pulmonary edema. No acute bony abnormality. Surgical clips overlie the lower neck bilaterally, as before. Telemetry leads overlie the chest. IMPRESSION: 1. Endotracheal tube tip is approximately 2.8 cm above the base of the carina. 2. Enlargement of the cardiopericardial silhouette with pulmonary vascular congestion. Electronically Signed   By: Camellia Candle M.D.   On: 09/28/2023 05:39     Labs:   Basic Metabolic Panel: Recent Labs  Lab 09/28/23 0534 09/28/23 1617 09/29/23 0550 09/30/23 0307  NA 137 135 136 138  K 3.3* 4.6 4.3 4.6  CL 100 99 100 102  CO2 23 21* 19* 22  GLUCOSE 180* 231* 212* 233*  BUN 13 15 19  32*  CREATININE 0.88 1.02* 1.31* 1.29*  CALCIUM  9.0 8.9 8.9 8.6*  MG 2.0 1.9 2.4 2.5*  PHOS  --   --   --  4.3   GFR Estimated Creatinine Clearance: 60.9 mL/min (A) (by C-G formula based on SCr of 1.29 mg/dL (H)). Liver Function Tests: Recent Labs  Lab 09/28/23 0534  AST 22  ALT 14  ALKPHOS 95  BILITOT 0.6  PROT 8.7*  ALBUMIN 4.2   No results for input(s): LIPASE, AMYLASE in the last 168 hours. No results for input(s): AMMONIA in the last 168  hours. Coagulation profile Recent Labs  Lab 09/28/23 0534  INR 1.1    CBC: Recent Labs  Lab 09/28/23 0534 09/29/23 0550 09/30/23 0307  WBC 8.3 10.2 10.1  NEUTROABS 4.7  --   --   HGB 13.9 12.6 11.6*  HCT 42.2 39.4 35.5*  MCV 85.4 85.5 83.9  PLT 267 257 251   Cardiac Enzymes: No results for input(s): CKTOTAL, CKMB, CKMBINDEX, TROPONINI in the last 168 hours. BNP: Invalid input(s): POCBNP CBG: Recent Labs  Lab 09/29/23 1923 09/29/23 2326 09/30/23 0330 09/30/23 0815 09/30/23 1140  GLUCAP 217* 231* 210* 170* 281*   D-Dimer No results for input(s): DDIMER in the last 72 hours. Hgb A1c No results for input(s): HGBA1C in the last 72 hours. Lipid Profile Recent Labs    09/29/23 0550  TRIG 196*   Thyroid  function studies Recent Labs    09/28/23 0534  TSH 2.724   Anemia work up No results for input(s): VITAMINB12, FOLATE, FERRITIN, TIBC, IRON, RETICCTPCT in the last 72 hours. Microbiology Recent Results (from the past 240 hours)  Blood Culture (routine x 2)     Status: None (Preliminary result)   Collection Time: 09/28/23  5:34 AM   Specimen: BLOOD LEFT FOREARM  Result Value Ref Range Status   Specimen Description   Final    BLOOD LEFT FOREARM Performed at [facility], [address] [place], [place] [address]    Special Requests   Final    BOTTLES DRAWN AEROBIC AND ANAEROBIC Blood Culture adequate volume Performed at [facility], [address] [place], [place] [address]    Culture  Setup Time   Final    GRAM POSITIVE RODS AEROBIC BOTTLE ONLY Gram Stain Report Called to,Read Back By and Verified With: M FIELDS,RN@0518  09/30/23 [employer] Performed at [facility], [address], [place], [place] [address]    Culture GRAM POSITIVE RODS  Final   Report Status PENDING  Incomplete  Blood Culture (routine x 2)     Status: None (Preliminary result)   Collection Time: 09/28/23  5:34 AM   Specimen: BLOOD RIGHT HAND  Result Value Ref  Range Status   Specimen Description BLOOD RIGHT HAND  Final   Special Requests   Final    BOTTLES DRAWN AEROBIC AND ANAEROBIC Blood Culture adequate volume   Culture   Final    NO GROWTH 2 DAYS Performed at [facility], [address], [place], [place] [address]    Report Status PENDING  Incomplete     Discharge Instructions:   Discharge Instructions     Call MD for:  difficulty breathing, headache or visual disturbances   Complete by: As directed    Call MD for:  temperature >100.4   Complete by: As directed    Diet - low  sodium heart healthy   Complete by: As directed    Discharge instructions   Complete by: As directed    Follow-up with your primary care provider in 1 week.  Check blood work at that time.  Check your blood pressure in the next visit.  Do not take benazepril .  Might need new blood pressure medication if remains high.  Seek medical attention for worsening symptoms.   Increase activity slowly   Complete by: As directed       Allergies as of 09/30/2023       Reactions   Benazepril  Anaphylaxis, Swelling   Angioedema   Blue Dye #1 (brilliant Blue) Anaphylaxis   Found in levothyroxine    Aspirin  Other (See Comments)   Unable to take due to blood thinner   Diltiazem  Itching   Procardia [nifedipine] Other (See Comments)   MIGRAINES        Medication List     STOP taking these medications    benazepril  40 MG tablet Commonly known as: LOTENSIN    DULoxetine  60 MG capsule Commonly known as: CYMBALTA        TAKE these medications    acetaminophen  325 MG tablet Commonly known as: TYLENOL  Take 650 mg by mouth every 6 (six) hours as needed for mild pain (pain score 1-3) or moderate pain (pain score 4-6).   albuterol  (2.5 MG/3ML) 0.083% nebulizer solution Commonly known as: PROVENTIL  USE 1 VIAL IN NEBULIZER EVERY 6 HOURS AS NEEDED FOR WHEEZING OR SHORTNESS OF BREATH. What changed: See the new instructions.   Ventolin  HFA 108 (90 Base) MCG/ACT  inhaler Generic drug: albuterol  INHALE 2 PUFFS INTO THE LUNGS EVERY 6 HOURS AS NEEDED FOR SHORTNESS OF BREATH OR WHEEZING. What changed: Another medication with the same name was changed. Make sure you understand how and when to take each.   amLODipine  10 MG tablet Commonly known as: NORVASC  TAKE (1) TABLET BY MOUTH ONCE DAILY.   apixaban  5 MG Tabs tablet Commonly known as: Eliquis  Take 1 tablet (5 mg total) by mouth 2 (two) times daily.   carvedilol  12.5 MG tablet Commonly known as: COREG  TAKE 1 TABLET BY MOUTH 2 TIMES DAILY WITH A MEAL.   cholecalciferol  25 MCG (1000 UNIT) tablet Commonly known as: VITAMIN D3 Take 1,000 Units by mouth daily.   dapagliflozin  propanediol 5 MG Tabs tablet Commonly known as: FARXIGA  Take 1 tablet (5 mg total) by mouth daily.   Dexcom G7 Sensor Misc USE TO MONITOR BLOOD SUGAR. CHANGE EVERY 10 DAYS.   diphenhydrAMINE  25 mg capsule Commonly known as: BENADRYL  Take 25 mg by mouth daily at 12 noon.   fluticasone  44 MCG/ACT inhaler Commonly known as: Flovent  HFA Inhale 2 puffs into the lungs 2 (two) times daily. What changed:  when to take this reasons to take this   HumaLOG Mix 75/25 KwikPen (75-25) 100 UNIT/ML KwikPen Generic drug: Insulin  Lispro Prot & Lispro Inject 12 Units into the skin in the morning and at bedtime.   Insulin  Pen Needle 31G X 8 MM Misc Use to inject insulin  one time daily   levothyroxine  137 MCG tablet Commonly known as: SYNTHROID  Take 137 mcg by mouth daily before breakfast.   Mounjaro 10 MG/0.5ML Pen Generic drug: tirzepatide Inject 10 mg into the skin once a week.   OneTouch Ultra test strip Generic drug: glucose blood USE TO TEST BLOOD SUGAR 4 TIMES DAILY AS DIRECTED.   polyethylene glycol 17 g packet Commonly known as: MIRALAX  / GLYCOLAX  Take 17 g by mouth  daily as needed for mild constipation.   pravastatin  40 MG tablet Commonly known as: PRAVACHOL  TAKE ONE TABLET BY MOUTH AT BEDTIME   predniSONE   10 MG tablet Commonly known as: DELTASONE  Take 3 tablets (30 mg total) by mouth daily with breakfast for 3 days.   pregabalin  100 MG capsule Commonly known as: LYRICA  Take 100 mg by mouth 3 (three) times daily.   traMADol  50 MG tablet Commonly known as: ULTRAM  Take 1-1.5 tablets (50-75 mg total) by mouth every 8 (eight) hours as needed for moderate pain (pain score 4-6) or severe pain (pain score 7-10).   triamcinolone  cream 0.1 % Commonly known as: KENALOG  APPLY TO AFFECTED AREAS TWICE DAILY PRN   Voltaren  Arthritis Pain 1 % Gel Generic drug: diclofenac  Sodium Apply 2 g topically 4 (four) times daily as needed (arthritis pain).        Follow-up Information     [facility], [facility] Follow up.   Specialty: Home Health Services Why: Once you are home from the hospital someone from [facility] will call you to start your Home Health Physical therapy. Contact information: [address] [place] [address] [phone]                  Time coordinating discharge: 39 minutes  Signed:  Ginna Schuur  [facility] 09/30/2023, 4:07 PM

## 2023-09-30 NOTE — TOC Transition Note (Addendum)
 Transition of Care [facility]) - Discharge Note   Patient Details  Name: [name] MRN: [id number] Date of Birth: Oct 17, 1955  [facility] CM/SW Contact:  Jon ONEIDA Anon, RN Phone Number: 09/30/2023, 2:20 PM   Clinical Narrative:    Pt will discharge home with daughters. Home Health PT/OT set up through [facility]. HH orders are in place. Pt will transport via [facility] at time of DC. Denies any DME or SDOH needs. There are no other ICM needs at this time.     Final next level of care: Home w Home Health Services Barriers to Discharge: No Barriers Identified   Patient Goals and CMS Choice Patient states their goals for this hospitalization and ongoing recovery are:: To return home with daughters CMS Medicare.gov Compare Post Acute Care list provided to:: Patient Choice offered to / list presented to : Patient [place] ownership interest in [facility].provided to:: Patient    Discharge Placement                  Name of family member notified: Enola Alken (Daughter)  [phone] Patient and family notified of of transfer: 09/30/23  Discharge Plan and Services Additional resources added to the After Visit Summary for                  DME Arranged: N/A DME Agency: NA       HH Arranged: PT HH Agency: [facility] Date [facility] Agency Contacted: 09/30/23 Time HH Agency Contacted: [phone] Representative spoke with at [facility] Agency: Cindie Sillmon  Social Drivers of Health (SDOH) Interventions SDOH Screenings   Food Insecurity: No Food Insecurity (09/28/2023)  Housing: Low Risk  (09/28/2023)  Transportation Needs: No Transportation Needs (09/28/2023)  Utilities: Not At Risk (09/28/2023)  Alcohol  Screen: Low Risk  (05/22/2023)  Depression (PHQ2-9): Low Risk  (08/19/2023)  Financial Resource Strain: Low Risk  (05/22/2023)  Physical Activity: Insufficiently Active (05/22/2023)  Social Connections: Unknown (09/28/2023)  Stress: No Stress Concern  Present (05/22/2023)  Tobacco Use: Medium Risk (09/28/2023)  Health Literacy: Adequate Health Literacy (05/22/2023)     Readmission Risk Interventions    11/15/2021   11:42 AM  Readmission Risk Prevention Plan  Transportation Screening Complete  PCP or Specialist Appt within 5-7 Days Not Complete  Home Care Screening Complete  Medication Review (RN CM) Complete

## 2023-09-30 NOTE — Evaluation (Signed)
 Occupational Therapy Evaluation Patient Details Name: [name] MRN: [id number] DOB: 02/12/1955 Today's Date: 09/30/2023   History of Present Illness   [name] is a 68 y/o female who presents for angioedema requiring invasive mechanical ventilation due to upper airway swelling. Grave's disease c/b postoperative hypothyroidism, atrial flutter on eliquis , HTN, HLD, DM, and recurrent episodes of angioedema     Clinical Impressions PTA, patient lives with family at home with daughters assisting with all LB self care and required hoyer lift from bed to recliner and w/c, able to self propel w/c short distances, bed level bathing and use of briefs for toileting. Currently, patient presents with deficits outlined below (see OT Problem List for details) most significantly LE pain, decreased activity tolerance, generalized muscle weakness,  decreased B shoulder ROM, and increased body habitus limiting BADL's and functional mobility performance. Patient requires continued Acute care hospital level OT services to progress safety and functional performance and allow for discharge. Recommending home with family assist and support with HHOT services. Patient reports she has all needed DME from previous.       If plan is discharge home, recommend the following:   Two people to help with walking and/or transfers;A lot of help with bathing/dressing/bathroom;Assistance with cooking/housework;Direct supervision/assist for medications management;Direct supervision/assist for financial management;Assist for transportation;Help with stairs or ramp for entrance     Functional Status Assessment   Patient has had a recent decline in their functional status and demonstrates the ability to make significant improvements in function in a reasonable and predictable amount of time.     Equipment Recommendations   None recommended by OT;Other (comment) (patient reports she has all needed DMe in home)       Precautions/Restrictions   Precautions Precautions: Fall Restrictions Weight Bearing Restrictions Per Provider Order: No     Mobility Bed Mobility Overal bed mobility: Needs Assistance Bed Mobility: Rolling Rolling: Mod assist, Used rails   Supine to sit: Mod assist, +2 for safety/equipment, HOB elevated, Used rails Sit to supine: Max assist, +2 for physical assistance, +2 for safety/equipment   General bed mobility comments: patient able to slide legs ot bed edge andused rails to pull to sitting, abl eto assist with scoot to bed edge, bed pad used some, max assist with legs back onto bed. able to roll to the  right, can move the  left leg  to facilitate roll, use of bed rail.    Transfers                   General transfer comment: will need lift equipment      Balance Overall balance assessment: Needs assistance Sitting-balance support: Feet supported, No upper extremity supported Sitting balance-Leahy Scale: Fair                                     ADL either performed or assessed with clinical judgement   ADL Overall ADL's : Needs assistance/impaired Eating/Feeding: Set up;Bed level   Grooming: Wash/dry hands;Wash/dry face;Oral care;Minimal assistance;Sitting Grooming Details (indicate cue type and reason): EOB tolerance Upper Body Bathing: Moderate assistance;Bed level   Lower Body Bathing: Total assistance;Bed level   Upper Body Dressing : Moderate assistance;Bed level   Lower Body Dressing: Total assistance;Bed level       Toileting- Clothing Manipulation and Hygiene: Total assistance;Bed level       Functional mobility during ADLs:  (will be a  lift transfer) General ADL Comments: body habitus and prolonged bed rest with chronic LE pain limits LB self care and functional reach     Vision Baseline Vision/History: 0 No visual deficits Ability to See in Adequate Light: 0 Adequate Patient Visual Report: No change from  baseline Vision Assessment?: No apparent visual deficits            Pertinent Vitals/Pain Pain Assessment Pain Assessment: Faces Faces Pain Scale: Hurts even more Facial Expression: Grimacing Body Movements: Protection Muscle Tension: Tense, rigid Compliance with ventilator (intubated pts.): Tolerating ventilator or movement Vocalization (extubated pts.): N/A CPOT Total: 4 Pain Location: L lower leg Pain Descriptors / Indicators: Grimacing, Guarding, Discomfort Pain Intervention(s): Monitored during session, Limited activity within patient's tolerance, Repositioned     Extremity/Trunk Assessment Upper Extremity Assessment Upper Extremity Assessment: Generalized weakness;Left hand dominant;RUE deficits/detail;LUE deficits/detail RUE: Shoulder pain with ROM (baseline at 120 degrees) LUE: Shoulder pain with ROM (baseline ~ 120 degrees)   Lower Extremity Assessment Lower Extremity Assessment: Defer to PT evaluation RLE Deficits / Details: limited knee flex( post failed TKA per pt), able to slide leg to bed edge,  cannot lift from bed RLE: Unable to fully assess due to pain LLE Deficits / Details: unable to lift leg, can slide to bed edge   Cervical / Trunk Assessment Cervical / Trunk Assessment: Normal;Other exceptions Cervical / Trunk Exceptions: body habitus   Communication Communication Communication: No apparent difficulties   Cognition Arousal: Alert Behavior During Therapy: WFL for tasks assessed/performed Cognition: No apparent impairments                               Following commands: Intact       Cueing  General Comments      no SOB, SpO2 on RA 98% EOB, limited by pain in LE's and effects of prolonged bed rest           Home Living Family/patient expects to be discharged to:: Private residence Living Arrangements: Children Available Help at Discharge: Available 24 hours/day Type of Home: House Home Access: Ramped entrance     Home  Layout: One level     Bathroom Shower/Tub: Chief Strategy Officer: Handicapped height Bathroom Accessibility: Yes   Home Equipment: Wheelchair - manual   Additional Comments: Product/process development scientist      Prior Functioning/Environment Prior Level of Function : Needs assist       Physical Assist : Mobility (physical)     Mobility Comments: nonambulatory, family Hoyers OOB to Wc, can self propel Wc, Spnge bath, uses depends for toileting. ADLs Comments: family assists bed level for bathing and use of briefs for toileting, hoyer to recliner, indep UB    OT Problem List: Decreased strength;Decreased range of motion;Decreased activity tolerance;Impaired balance (sitting and/or standing);Cardiopulmonary status limiting activity;Obesity;Pain   OT Treatment/Interventions: Self-care/ADL training;Therapeutic exercise;Neuromuscular education;Energy conservation;DME and/or AE instruction;Therapeutic activities;Manual therapy;Patient/family education;Balance training      OT Goals(Current goals can be found in the care plan section)   Acute Rehab OT Goals Patient Stated Goal: to go home OT Goal Formulation: With patient Time For Goal Achievement: 10/14/23 Potential to Achieve Goals: Fair ADL Goals Pt Will Perform Grooming: with set-up Pt Will Perform Upper Body Bathing: with supervision;bed level Pt Will Perform Upper Body Dressing: with contact guard assist;sitting Pt/caregiver will Perform Home Exercise Program: Increased strength;Both right and left upper extremity;With written HEP provided;With Supervision   OT Frequency:  Min  2X/week    Co-evaluation   Reason for Co-Treatment: To address functional/ADL transfers PT goals addressed during session: Mobility/safety with mobility OT goals addressed during session: ADL's and self-care      AM-PAC OT 6 Clicks Daily Activity     Outcome Measure Help from another person eating meals?: A Little Help from another person taking care  of personal grooming?: A Little Help from another person toileting, which includes using toliet, bedpan, or urinal?: Total Help from another person bathing (including washing, rinsing, drying)?: A Lot Help from another person to put on and taking off regular upper body clothing?: A Lot Help from another person to put on and taking off regular lower body clothing?: Total 6 Click Score: 12   End of Session Nurse Communication: Mobility status;Weight bearing status;Other (comment) (nursing present for most of session for BP meds)  Activity Tolerance: Patient limited by pain Patient left: in bed;with call bell/phone within reach;with bed alarm set  OT Visit Diagnosis: Other abnormalities of gait and mobility (R26.89);Muscle weakness (generalized) (M62.81);Pain Pain - part of body: Leg (Bly)                Time: 1030-1105 OT Time Calculation (min): 35 min Charges:  OT General Charges $OT Visit: 1 Visit OT Evaluation $OT Eval Low Complexity: 1 Low  Doyt Castellana OT/L Acute Rehabilitation Department  [phone]  09/30/2023, 2:48 PM

## 2023-10-01 ENCOUNTER — Telehealth: Payer: Self-pay | Admitting: *Deleted

## 2023-10-01 ENCOUNTER — Telehealth: Payer: Self-pay | Admitting: Family Medicine

## 2023-10-01 MED FILL — Fentanyl Citrate Preservative Free (PF) Inj 100 MCG/2ML: INTRAMUSCULAR | Qty: 2 | Status: AC

## 2023-10-01 NOTE — Transitions of Care (Post Inpatient/ED Visit) (Signed)
   10/01/2023  Name: [name] MRN: [id number] DOB: [birth year]  Today's TOC FU Call Status: Today's TOC FU Call Status:: Unsuccessful Call (1st Attempt) Unsuccessful Call (1st Attempt) Date: 10/01/23  Attempted to reach the patient regarding the most recent Inpatient/ED visit.  Follow Up Plan: Additional outreach attempts will be made to reach the patient to complete the Transitions of Care (Post Inpatient/ED visit) call.   Mliss Creed [facility], BSN RN Care Manager/ Transition of Care [place]/ [facility] [phone]

## 2023-10-01 NOTE — Telephone Encounter (Unsigned)
 Copied from CRM [id number]. Topic: Clinical - Medication Question >> Oct 01, 2023  1:34 PM Wess RAMAN wrote: Reason for CRM: Patient's daughter, Louretta, would like to speak to Dr. Bluford about changing her blood pressure medication (Benazepril  hydrochloride (HCl). Patient was hospitialized due to an allergic reaction on 9/15 with swollen tongue and sore throat and believes the medication was the cause.  Callback #: [phone]  Pharmacy: [facility] [place], [place] - [id number] [place] [id number] Phone: [phone] Fax: [phone] Hours: Not open 24 hours

## 2023-10-02 ENCOUNTER — Telehealth: Payer: Self-pay | Admitting: *Deleted

## 2023-10-02 NOTE — Telephone Encounter (Signed)
 Spoke with patient and she has scheduled an appt for next week for er follow up

## 2023-10-02 NOTE — Transitions of Care (Post Inpatient/ED Visit) (Signed)
   10/02/2023  Name: [name] MRN: [id number] DOB: 1955/03/05  Today's TOC FU Call Status: Today's TOC FU Call Status:: Unsuccessful Call (2nd Attempt) Unsuccessful Call (2nd Attempt) Date: 10/02/23  Attempted to reach the patient regarding the most recent Inpatient/ED visit.  Follow Up Plan: Additional outreach attempts will be made to reach the patient to complete the Transitions of Care (Post Inpatient/ED visit) call.   Mliss Creed [facility], BSN RN Care Manager/ Transition of Care [place]/ [facility] [phone]

## 2023-10-03 DIAGNOSIS — E785 Hyperlipidemia, unspecified: Secondary | ICD-10-CM | POA: Diagnosis not present

## 2023-10-03 DIAGNOSIS — I4892 Unspecified atrial flutter: Secondary | ICD-10-CM | POA: Diagnosis not present

## 2023-10-03 DIAGNOSIS — Z853 Personal history of malignant neoplasm of breast: Secondary | ICD-10-CM | POA: Diagnosis not present

## 2023-10-03 DIAGNOSIS — Z7952 Long term (current) use of systemic steroids: Secondary | ICD-10-CM | POA: Diagnosis not present

## 2023-10-03 DIAGNOSIS — Z794 Long term (current) use of insulin: Secondary | ICD-10-CM | POA: Diagnosis not present

## 2023-10-03 DIAGNOSIS — Z7901 Long term (current) use of anticoagulants: Secondary | ICD-10-CM | POA: Diagnosis not present

## 2023-10-03 DIAGNOSIS — Z7985 Long-term (current) use of injectable non-insulin antidiabetic drugs: Secondary | ICD-10-CM | POA: Diagnosis not present

## 2023-10-03 DIAGNOSIS — I493 Ventricular premature depolarization: Secondary | ICD-10-CM | POA: Diagnosis not present

## 2023-10-03 DIAGNOSIS — Z96652 Presence of left artificial knee joint: Secondary | ICD-10-CM | POA: Diagnosis not present

## 2023-10-03 DIAGNOSIS — E66813 Obesity, class 3: Secondary | ICD-10-CM | POA: Diagnosis not present

## 2023-10-03 DIAGNOSIS — G4733 Obstructive sleep apnea (adult) (pediatric): Secondary | ICD-10-CM | POA: Diagnosis not present

## 2023-10-03 DIAGNOSIS — Z6841 Body Mass Index (BMI) 40.0 and over, adult: Secondary | ICD-10-CM | POA: Diagnosis not present

## 2023-10-03 DIAGNOSIS — E119 Type 2 diabetes mellitus without complications: Secondary | ICD-10-CM | POA: Diagnosis not present

## 2023-10-03 DIAGNOSIS — J45909 Unspecified asthma, uncomplicated: Secondary | ICD-10-CM | POA: Diagnosis not present

## 2023-10-03 DIAGNOSIS — I10 Essential (primary) hypertension: Secondary | ICD-10-CM | POA: Diagnosis not present

## 2023-10-03 DIAGNOSIS — G8929 Other chronic pain: Secondary | ICD-10-CM | POA: Diagnosis not present

## 2023-10-03 DIAGNOSIS — E890 Postprocedural hypothyroidism: Secondary | ICD-10-CM | POA: Diagnosis not present

## 2023-10-03 DIAGNOSIS — Z7951 Long term (current) use of inhaled steroids: Secondary | ICD-10-CM | POA: Diagnosis not present

## 2023-10-03 DIAGNOSIS — T783XXD Angioneurotic edema, subsequent encounter: Secondary | ICD-10-CM | POA: Diagnosis not present

## 2023-10-03 DIAGNOSIS — Z9181 History of falling: Secondary | ICD-10-CM | POA: Diagnosis not present

## 2023-10-03 LAB — CULTURE, BLOOD (ROUTINE X 2)
Culture: NO GROWTH
Special Requests: ADEQUATE

## 2023-10-05 ENCOUNTER — Telehealth: Payer: Self-pay | Admitting: *Deleted

## 2023-10-05 LAB — CULTURE, BLOOD (ROUTINE X 2): Special Requests: ADEQUATE

## 2023-10-05 NOTE — Transitions of Care (Post Inpatient/ED Visit) (Signed)
 10/05/2023  Name: [name] MRN: [id number] DOB: [birth year]  Today's TOC FU Call Status: Today's TOC FU Call Status:: Successful TOC FU Call Completed TOC FU Call Complete Date: 10/05/23 Patient's Name and Date of Birth confirmed.  Transition Care Management Follow-up Telephone Call Date of Discharge: 09/30/23 Discharge Facility: [facility]) Type of Discharge: Inpatient Admission Primary Inpatient Discharge Diagnosis:: angioedema How have you been since you were released from the hospital?: Better Any questions or concerns?: No  Items Reviewed: Did you receive and understand the discharge instructions provided?: Yes Medications obtained,verified, and reconciled?: Yes (Medications Reviewed) Any new allergies since your discharge?: No Dietary orders reviewed?: Yes Type of Diet Ordered:: low sodium, heart healthy, carb modified Do you have support at home?: Yes People in Home [RPT]: child(ren), adult Name of Support/Comfort Primary Source: Clotilda and Dyjxpdyj Hamptom-Daughters  Medications Reviewed Today: Medications Reviewed Today     Reviewed by Lucky Andrea LABOR, RN (Registered Nurse) on 10/05/23 at 1552  Med List Status: <None>   Medication Order Taking? Sig Documenting Provider Last Dose Status Informant  acetaminophen  (TYLENOL ) 325 MG tablet [id number] Yes Take 650 mg by mouth every 6 (six) hours as needed for mild pain (pain score 1-3) or moderate pain (pain score 4-6).  Patient taking differently: Take 2,000 mg by mouth 3 (three) times daily.   [provider]  Active Pharmacy Records, Multiple Informants, Family Member  albuterol  (PROVENTIL ) (2.5 MG/3ML) 0.083% nebulizer solution [id number] Yes USE 1 VIAL IN NEBULIZER EVERY 6 HOURS AS NEEDED FOR WHEEZING OR SHORTNESS OF BREATH. Cook, Jayce G, DO  Active Pharmacy Records, Multiple Informants, Family Member  amLODipine  (NORVASC ) 10 MG tablet [id number] Yes TAKE (1) TABLET BY MOUTH ONCE DAILY. Cook, Jayce G,  DO  Active Pharmacy Records, Multiple Informants, Family Member  apixaban  (ELIQUIS ) 5 MG TABS tablet [id number] Yes Take 1 tablet (5 mg total) by mouth 2 (two) times daily. Cook, Jayce G, DO  Active Pharmacy Records, Multiple Informants, Family Member  carvedilol  (COREG ) 12.5 MG tablet [id number] Yes TAKE 1 TABLET BY MOUTH 2 TIMES DAILY WITH A MEAL. Cook, Jayce G, DO  Active Pharmacy Records, Multiple Informants, Family Member  cholecalciferol  (VITAMIN D3) 25 MCG (1000 UNIT) tablet [id number] Yes Take 1,000 Units by mouth daily. [provider]  Active Pharmacy Records, Multiple Informants, Family Member  Continuous Glucose Sensor (DEXCOM G7 SENSOR) [place] [id number] Yes USE TO MONITOR BLOOD SUGAR. CHANGE EVERY 10 DAYS. [provider]  Active Pharmacy Records, Multiple Informants, Family Member  dapagliflozin  propanediol (FARXIGA ) 5 MG TABS tablet [id number] Yes Take 1 tablet (5 mg total) by mouth daily. Cook, Jayce G, DO  Active Pharmacy Records, Multiple Informants, Family Member           Med Note STEFFI, ADELITA   Mon Sep 28, 2023 10:46 AM) New medication started on 09/24/2023   diclofenac  Sodium (VOLTAREN  ARTHRITIS PAIN) 1 % GEL [id number] Yes Apply 2 g topically 4 (four) times daily as needed (arthritis pain). [provider]  Active Pharmacy Records, Multiple Informants, Family Member  diphenhydrAMINE  (BENADRYL ) 25 mg capsule [id number]  Take 25 mg by mouth daily at 12 noon.  Patient not taking: Reported on 10/05/2023   [provider]  Active Pharmacy Records, Multiple Informants, Family Member  fluticasone  (FLOVENT  HFA) 44 MCG/ACT inhaler [id number] Yes Inhale 2 puffs into the lungs 2 (two) times daily.  Patient taking differently: Inhale 2 puffs into the lungs daily as needed (shortness of breath).   Alphonsa Fallow  S, MD  Active Pharmacy Records, Multiple Informants, Family Member  glucose blood (ONETOUCH ULTRA) test strip [id number] Yes USE TO TEST BLOOD SUGAR  4 TIMES DAILY AS DIRECTED. Cook, Jayce G, DO  Active Pharmacy Records, Multiple Informants, Family Member  HUMALOG MIX 75/25 KWIKPEN (75-25) 100 UNIT/ML [place] [id number] Yes Inject 12 Units into the skin in the morning and at bedtime. [provider]  Active Pharmacy Records, Multiple Informants, Family Member           Med Note DELBERTA, [place] C   Dju Aug 31, 2021  1:11 PM)    Insulin  Pen Needle 31G X 8 MM MISC [id number] Yes Use to inject insulin  one time daily [provider]  Active Pharmacy Records, Multiple Informants, Family Member           Med Note DELBERTA, [place] C   Dju Aug 31, 2021  1:11 PM)    levothyroxine  (SYNTHROID ) 137 MCG tablet [id number] Yes Take 137 mcg by mouth daily before breakfast. [provider]  Active Pharmacy Records, Multiple Informants, Family Member  MOUNJARO 10 MG/0.5ML Pen [id number] Yes Inject 10 mg into the skin once a week. [provider]  Active Pharmacy Records, Multiple Informants, Family Member  polyethylene glycol (MIRALAX  / GLYCOLAX ) 17 g packet [id number] Yes Take 17 g by mouth daily as needed for mild constipation. Maree Adron BIRCH, DO  Active Pharmacy Records, Multiple Informants, Family Member  pravastatin  (PRAVACHOL ) 40 MG tablet [id number] Yes TAKE ONE TABLET BY MOUTH AT BEDTIME Cook, Jayce G, DO  Active Pharmacy Records, Multiple Informants, Family Member  pregabalin  (LYRICA ) 100 MG capsule [id number] Yes Take 100 mg by mouth 3 (three) times daily. [provider]  Active Pharmacy Records, Multiple Informants, Family Member  traMADol  (ULTRAM ) 50 MG tablet [id number] Yes Take 1-1.5 tablets (50-75 mg total) by mouth every 8 (eight) hours as needed for moderate pain (pain score 4-6) or severe pain (pain score 7-10). Cook, Jayce G, DO  Active Pharmacy Records, Multiple Informants, Family Member  triamcinolone  cream (KENALOG ) 0.1 % [id number]  APPLY TO AFFECTED AREAS TWICE DAILY PRN Mauro Elveria BROCKS, NP  Active Pharmacy  Records, Multiple Informants, Family Member  VENTOLIN  HFA 108 [phone] Base) MCG/ACT inhaler [id number]  INHALE 2 PUFFS INTO THE LUNGS EVERY 6 HOURS AS NEEDED FOR SHORTNESS OF BREATH OR WHEEZING.  Patient not taking: Reported on 10/05/2023   Cook, Jayce G, DO  Active Pharmacy Records, Multiple Informants, Family Member            Home Care and Equipment/Supplies: Were Home Health Services Ordered?: Yes Name of Home Health Agency:: [facility] Has Agency set up a time to come to your home?: Yes First Home Health Visit Date: 10/06/23 Any new equipment or medical supplies ordered?: No  Functional Questionnaire: Do you need assistance with bathing/showering or dressing?: Yes Do you need assistance with meal preparation?: Yes Do you need assistance with eating?: No Do you have difficulty maintaining continence: Yes Do you need assistance with getting out of bed/getting out of a chair/moving?: Yes Do you have difficulty managing or taking your medications?: No  Follow up appointments reviewed: PCP Follow-up appointment confirmed?: Yes Date of PCP follow-up appointment?: 10/08/23 Follow-up Provider: Dr. [facility] Follow-up appointment confirmed?: NA Do you need transportation to your follow-up appointment?: No Do you understand care options if your condition(s) worsen?: Yes-patient verbalized understanding  SDOH Interventions Today    Flowsheet Row Most Recent Value  SDOH Interventions   Food  Insecurity Interventions Intervention Not Indicated  Housing Interventions Intervention Not Indicated  Transportation Interventions Intervention Not Indicated  Utilities Interventions Intervention Not Indicated    Goals Addressed             This Visit's Progress    [facility] Transitions of Care (TOC) Care Plan       Problems:  Recent Hospitalization for treatment of Angioedema Equipment/DME barrier Patient would like a power chair to aide in mobility and  independence  Goal:  Over the next 30 days, the patient will not experience hospital readmission  Interventions:  Transitions of Care: Doctor Visits  - discussed the importance of doctor visits Post discharge activity limitations prescribed by provider reviewed Medication reviewed, discussed discontinued Benazepril  Advised patient to contact her insurance provider to inquire about coverage for power chair Advised patient to discuss power chair with PCP during upcoming visit Discussed the importance of patient checking her BP daily, recording and taking to provider visits  Patient Self Care Activities:  Attend all scheduled provider appointments Call provider office for new concerns or questions  Notify RN Care Manager of TOC call rescheduling needs Participate in Transition of Care Program/Attend TOC scheduled calls Take medications as prescribed   Monitor BP, record and take to provider appointments  Plan:  Telephone follow up appointment with care management team member scheduled for:  10/13/23 at 2:30pm        Andrea Dimes RN, BSN [place]  [facility] RN Care Manager [phone]

## 2023-10-07 DIAGNOSIS — Z7952 Long term (current) use of systemic steroids: Secondary | ICD-10-CM | POA: Diagnosis not present

## 2023-10-07 DIAGNOSIS — G8929 Other chronic pain: Secondary | ICD-10-CM | POA: Diagnosis not present

## 2023-10-07 DIAGNOSIS — Z7951 Long term (current) use of inhaled steroids: Secondary | ICD-10-CM | POA: Diagnosis not present

## 2023-10-07 DIAGNOSIS — Z794 Long term (current) use of insulin: Secondary | ICD-10-CM | POA: Diagnosis not present

## 2023-10-07 DIAGNOSIS — T783XXD Angioneurotic edema, subsequent encounter: Secondary | ICD-10-CM | POA: Diagnosis not present

## 2023-10-07 DIAGNOSIS — Z7985 Long-term (current) use of injectable non-insulin antidiabetic drugs: Secondary | ICD-10-CM | POA: Diagnosis not present

## 2023-10-07 DIAGNOSIS — J45909 Unspecified asthma, uncomplicated: Secondary | ICD-10-CM | POA: Diagnosis not present

## 2023-10-07 DIAGNOSIS — G4733 Obstructive sleep apnea (adult) (pediatric): Secondary | ICD-10-CM | POA: Diagnosis not present

## 2023-10-07 DIAGNOSIS — E119 Type 2 diabetes mellitus without complications: Secondary | ICD-10-CM | POA: Diagnosis not present

## 2023-10-07 DIAGNOSIS — Z6841 Body Mass Index (BMI) 40.0 and over, adult: Secondary | ICD-10-CM | POA: Diagnosis not present

## 2023-10-07 DIAGNOSIS — Z9181 History of falling: Secondary | ICD-10-CM | POA: Diagnosis not present

## 2023-10-07 DIAGNOSIS — Z7901 Long term (current) use of anticoagulants: Secondary | ICD-10-CM | POA: Diagnosis not present

## 2023-10-07 DIAGNOSIS — E785 Hyperlipidemia, unspecified: Secondary | ICD-10-CM | POA: Diagnosis not present

## 2023-10-07 DIAGNOSIS — Z853 Personal history of malignant neoplasm of breast: Secondary | ICD-10-CM | POA: Diagnosis not present

## 2023-10-07 DIAGNOSIS — E890 Postprocedural hypothyroidism: Secondary | ICD-10-CM | POA: Diagnosis not present

## 2023-10-07 DIAGNOSIS — I493 Ventricular premature depolarization: Secondary | ICD-10-CM | POA: Diagnosis not present

## 2023-10-07 DIAGNOSIS — I4892 Unspecified atrial flutter: Secondary | ICD-10-CM | POA: Diagnosis not present

## 2023-10-07 DIAGNOSIS — E66813 Obesity, class 3: Secondary | ICD-10-CM | POA: Diagnosis not present

## 2023-10-07 DIAGNOSIS — Z96652 Presence of left artificial knee joint: Secondary | ICD-10-CM | POA: Diagnosis not present

## 2023-10-07 DIAGNOSIS — I10 Essential (primary) hypertension: Secondary | ICD-10-CM | POA: Diagnosis not present

## 2023-10-08 ENCOUNTER — Inpatient Hospital Stay: Admitting: Family Medicine

## 2023-10-09 ENCOUNTER — Telehealth: Payer: Self-pay

## 2023-10-09 DIAGNOSIS — I493 Ventricular premature depolarization: Secondary | ICD-10-CM | POA: Diagnosis not present

## 2023-10-09 DIAGNOSIS — Z7952 Long term (current) use of systemic steroids: Secondary | ICD-10-CM | POA: Diagnosis not present

## 2023-10-09 DIAGNOSIS — Z9181 History of falling: Secondary | ICD-10-CM | POA: Diagnosis not present

## 2023-10-09 DIAGNOSIS — Z794 Long term (current) use of insulin: Secondary | ICD-10-CM | POA: Diagnosis not present

## 2023-10-09 DIAGNOSIS — E66813 Obesity, class 3: Secondary | ICD-10-CM | POA: Diagnosis not present

## 2023-10-09 DIAGNOSIS — T783XXD Angioneurotic edema, subsequent encounter: Secondary | ICD-10-CM | POA: Diagnosis not present

## 2023-10-09 DIAGNOSIS — Z7951 Long term (current) use of inhaled steroids: Secondary | ICD-10-CM | POA: Diagnosis not present

## 2023-10-09 DIAGNOSIS — Z6841 Body Mass Index (BMI) 40.0 and over, adult: Secondary | ICD-10-CM | POA: Diagnosis not present

## 2023-10-09 DIAGNOSIS — Z7985 Long-term (current) use of injectable non-insulin antidiabetic drugs: Secondary | ICD-10-CM | POA: Diagnosis not present

## 2023-10-09 DIAGNOSIS — G4733 Obstructive sleep apnea (adult) (pediatric): Secondary | ICD-10-CM | POA: Diagnosis not present

## 2023-10-09 DIAGNOSIS — Z96652 Presence of left artificial knee joint: Secondary | ICD-10-CM | POA: Diagnosis not present

## 2023-10-09 DIAGNOSIS — E119 Type 2 diabetes mellitus without complications: Secondary | ICD-10-CM | POA: Diagnosis not present

## 2023-10-09 DIAGNOSIS — Z7901 Long term (current) use of anticoagulants: Secondary | ICD-10-CM | POA: Diagnosis not present

## 2023-10-09 DIAGNOSIS — E785 Hyperlipidemia, unspecified: Secondary | ICD-10-CM | POA: Diagnosis not present

## 2023-10-09 DIAGNOSIS — G8929 Other chronic pain: Secondary | ICD-10-CM | POA: Diagnosis not present

## 2023-10-09 DIAGNOSIS — I4892 Unspecified atrial flutter: Secondary | ICD-10-CM | POA: Diagnosis not present

## 2023-10-09 DIAGNOSIS — J45909 Unspecified asthma, uncomplicated: Secondary | ICD-10-CM | POA: Diagnosis not present

## 2023-10-09 DIAGNOSIS — E890 Postprocedural hypothyroidism: Secondary | ICD-10-CM | POA: Diagnosis not present

## 2023-10-09 DIAGNOSIS — Z853 Personal history of malignant neoplasm of breast: Secondary | ICD-10-CM | POA: Diagnosis not present

## 2023-10-09 DIAGNOSIS — I10 Essential (primary) hypertension: Secondary | ICD-10-CM | POA: Diagnosis not present

## 2023-10-09 NOTE — Telephone Encounter (Signed)
 Copied from CRM [id number]. Topic: Clinical - Home Health Verbal Orders >> Oct 09, 2023 10:34 AM Avram MATSU wrote: Caller/Agency: [facility] Number: [phone] Service Requested: Occupational Therapy Frequency: 2*4 Any new concerns about the patient? No

## 2023-10-12 ENCOUNTER — Encounter: Payer: Self-pay | Admitting: Family Medicine

## 2023-10-12 ENCOUNTER — Ambulatory Visit (INDEPENDENT_AMBULATORY_CARE_PROVIDER_SITE_OTHER): Admitting: Family Medicine

## 2023-10-12 VITALS — BP 172/95 | HR 80

## 2023-10-12 DIAGNOSIS — Z23 Encounter for immunization: Secondary | ICD-10-CM | POA: Diagnosis not present

## 2023-10-12 DIAGNOSIS — E876 Hypokalemia: Secondary | ICD-10-CM | POA: Diagnosis not present

## 2023-10-12 DIAGNOSIS — D649 Anemia, unspecified: Secondary | ICD-10-CM | POA: Diagnosis not present

## 2023-10-12 DIAGNOSIS — T783XXD Angioneurotic edema, subsequent encounter: Secondary | ICD-10-CM

## 2023-10-12 NOTE — Patient Instructions (Signed)
 Labs today.  Follow up in 3-6 months.  Take care  Dr. Bluford

## 2023-10-12 NOTE — Progress Notes (Signed)
 Subjective:  Patient ID: [name], female    DOB: 1955/02/13  Age: 68 y.o. MRN: [id number]  CC:   Chief Complaint  Patient presents with   Hospitalization Follow-up    HPI:  68 year old female presents for hospital follow-up.  Patient recently hospitalized from 9/15 to 9/17.  Hospital course, labs, notes reviewed.  In summary:  Patient presented with angioedema.  She had had prior bouts of mild tongue swelling but these resolved with supportive care.  She presented with acute onset severe angioedema which required intubation.  ACE inhibitor was stopped.  Patient was treated with IV corticosteroids.  Patient was subsequently extubated and did well following.  Of note, C4 complement mildly elevated and C1 esterase inhibitor level was elevated as well.  Patient presents today feeling well.  No chest pain or shortness of breath.  She is in good spirits.  She is amenable to getting her flu vaccine today.  Patient Active Problem List   Diagnosis Date Noted   Angioedema 09/28/2023   Hyperlipidemia associated with type 2 diabetes mellitus (HCC) 08/19/2023   Osteoarthritis of right knee 11/07/2021   Morbid obesity (HCC) 11/06/2021   H/O atrial flutter 09/01/2021   Diabetic neuropathy (HCC) 04/30/2021   Healthcare maintenance 10/31/2020   Depression 05/24/2015   Obstructive sleep apnea 04/04/2014   Mitral regurgitation 11/08/2013   Malignant neoplasm of upper-outer quadrant of right breast in female, estrogen receptor positive (HCC) 01/31/2013   Graves' eye disease 05/20/2011   Postoperative hypothyroidism 03/13/2011   Asthma 12/08/2010   Hypertension    Type 2 diabetes mellitus with hyperlipidemia (HCC)    Tobacco abuse, in remission     Social Hx   Social History   Socioeconomic History   Marital status: Divorced    Spouse name: Not on file   Number of children: 2   Years of education: 12   Highest education level: 12th grade  Occupational History   Occupation:  Disability  Tobacco Use   Smoking status: Former    Current packs/day: 0.00    Average packs/day: 0.3 packs/day for 20.0 years (5.0 ttl pk-yrs)    Types: Cigarettes    Start date: 02/10/1971    Quit date: 02/10/1991    Years since quitting: 32.6    Passive exposure: Past   Smokeless tobacco: Never  Vaping Use   Vaping status: Never Used  Substance and Sexual Activity   Alcohol  use: No   Drug use: No   Sexual activity: Not Currently    Partners: Male    Birth control/protection: Surgical  Other Topics Concern   Not on file  Social History Narrative   Lives in [place] with 2 daughters    Social Drivers of Health   Financial Resource Strain: Low Risk  (05/22/2023)   Overall Financial Resource Strain (CARDIA)    Difficulty of Paying Living Expenses: Not hard at all  Food Insecurity: No Food Insecurity (10/05/2023)   Hunger Vital Sign    Worried About Running Out of Food in the Last Year: Never true    Ran Out of Food in the Last Year: Never true  Transportation Needs: Unknown (10/05/2023)   PRAPARE - Administrator, Civil Service (Medical): No    Lack of Transportation (Non-Medical): Not on file  Physical Activity: Insufficiently Active (05/22/2023)   Exercise Vital Sign    Days of Exercise per Week: 3 days    Minutes of Exercise per Session: 30 min  Stress: No Stress Concern  Present (05/22/2023)   [employer] of Occupational Health - Occupational Stress Questionnaire    Feeling of Stress : Not at all  Social Connections: Unknown (09/28/2023)   Social Connection and Isolation Panel    Frequency of Communication with Friends and Family: More than three times a week    Frequency of Social Gatherings with Friends and Family: More than three times a week    Attends Religious Services: Not on file    Active Member of Clubs or Organizations: Yes    Attends Banker Meetings: Never    Marital Status: Divorced    Review of Systems Per HPI  Objective:   BP (!) 172/95   Pulse 80   SpO2 96%      10/12/2023    9:58 AM 09/30/2023    4:00 PM 09/30/2023    1:00 PM  BP/Weight  Systolic BP 172 132 159  Diastolic BP 95 66 71    Physical Exam Vitals and nursing note reviewed.  Constitutional:      General: She is not in acute distress.    Appearance: Normal appearance. She is obese.  HENT:     Head: Normocephalic and atraumatic.  Cardiovascular:     Rate and Rhythm: Normal rate and regular rhythm.  Pulmonary:     Effort: Pulmonary effort is normal.     Breath sounds: Normal breath sounds. No wheezing, rhonchi or rales.  Neurological:     Mental Status: She is alert.  Psychiatric:        Mood and Affect: Mood normal.        Behavior: Behavior normal.     Lab Results  Component Value Date   WBC 10.1 09/30/2023   HGB 11.6 (L) 09/30/2023   HCT 35.5 (L) 09/30/2023   PLT 251 09/30/2023   GLUCOSE 233 (H) 09/30/2023   CHOL 162 08/19/2023   TRIG 196 (H) 09/29/2023   HDL 60 08/19/2023   LDLCALC 85 08/19/2023   ALT 14 09/28/2023   AST 22 09/28/2023   NA 138 09/30/2023   K 4.6 09/30/2023   CL 102 09/30/2023   CREATININE 1.29 (H) 09/30/2023   BUN 32 (H) 09/30/2023   CO2 22 09/30/2023   TSH 2.724 09/28/2023   INR 1.1 09/28/2023   HGBA1C 8.1 (H) 08/19/2023     Assessment & Plan:  Angioedema, subsequent encounter Assessment & Plan: Hospital course, labs, notes, discharge summary reviewed.   Doing well at this time.  Reaching out to allergy to discuss elevated C1.  No ACE inhibitor or ARB in the future.  Enalapril has been discontinued. Repeating labs today as recommended by hospitalist on discharge summary.   Hypokalemia -     Basic metabolic panel with GFR -     Magnesium   Anemia, unspecified type -     CBC with Differential/Platelet  Encounter for immunization -     Flu vaccine HIGH DOSE PF(Fluzone Trivalent)    Follow-up:  3-6 months  Quientin Jent Bluford DO [facility]

## 2023-10-12 NOTE — Telephone Encounter (Signed)
 Left message giving verbal for therapy needs.

## 2023-10-12 NOTE — Assessment & Plan Note (Addendum)
 Hospital course, labs, notes, discharge summary reviewed.   Doing well at this time.  Reaching out to allergy to discuss elevated C1.  No ACE inhibitor or ARB in the future.  Enalapril has been discontinued. Repeating labs today as recommended by hospitalist on discharge summary.

## 2023-10-13 ENCOUNTER — Other Ambulatory Visit: Payer: Self-pay | Admitting: *Deleted

## 2023-10-13 DIAGNOSIS — I493 Ventricular premature depolarization: Secondary | ICD-10-CM | POA: Diagnosis not present

## 2023-10-13 DIAGNOSIS — E890 Postprocedural hypothyroidism: Secondary | ICD-10-CM | POA: Diagnosis not present

## 2023-10-13 DIAGNOSIS — Z7901 Long term (current) use of anticoagulants: Secondary | ICD-10-CM | POA: Diagnosis not present

## 2023-10-13 DIAGNOSIS — E119 Type 2 diabetes mellitus without complications: Secondary | ICD-10-CM | POA: Diagnosis not present

## 2023-10-13 DIAGNOSIS — G8929 Other chronic pain: Secondary | ICD-10-CM | POA: Diagnosis not present

## 2023-10-13 DIAGNOSIS — Z7985 Long-term (current) use of injectable non-insulin antidiabetic drugs: Secondary | ICD-10-CM | POA: Diagnosis not present

## 2023-10-13 DIAGNOSIS — Z853 Personal history of malignant neoplasm of breast: Secondary | ICD-10-CM | POA: Diagnosis not present

## 2023-10-13 DIAGNOSIS — Z7952 Long term (current) use of systemic steroids: Secondary | ICD-10-CM | POA: Diagnosis not present

## 2023-10-13 DIAGNOSIS — Z794 Long term (current) use of insulin: Secondary | ICD-10-CM | POA: Diagnosis not present

## 2023-10-13 DIAGNOSIS — J45909 Unspecified asthma, uncomplicated: Secondary | ICD-10-CM | POA: Diagnosis not present

## 2023-10-13 DIAGNOSIS — T783XXD Angioneurotic edema, subsequent encounter: Secondary | ICD-10-CM | POA: Diagnosis not present

## 2023-10-13 DIAGNOSIS — Z7951 Long term (current) use of inhaled steroids: Secondary | ICD-10-CM | POA: Diagnosis not present

## 2023-10-13 DIAGNOSIS — Z6841 Body Mass Index (BMI) 40.0 and over, adult: Secondary | ICD-10-CM | POA: Diagnosis not present

## 2023-10-13 DIAGNOSIS — E785 Hyperlipidemia, unspecified: Secondary | ICD-10-CM | POA: Diagnosis not present

## 2023-10-13 DIAGNOSIS — G4733 Obstructive sleep apnea (adult) (pediatric): Secondary | ICD-10-CM | POA: Diagnosis not present

## 2023-10-13 DIAGNOSIS — Z96652 Presence of left artificial knee joint: Secondary | ICD-10-CM | POA: Diagnosis not present

## 2023-10-13 DIAGNOSIS — E66813 Obesity, class 3: Secondary | ICD-10-CM | POA: Diagnosis not present

## 2023-10-13 DIAGNOSIS — Z9181 History of falling: Secondary | ICD-10-CM | POA: Diagnosis not present

## 2023-10-13 DIAGNOSIS — I4892 Unspecified atrial flutter: Secondary | ICD-10-CM | POA: Diagnosis not present

## 2023-10-13 DIAGNOSIS — I10 Essential (primary) hypertension: Secondary | ICD-10-CM | POA: Diagnosis not present

## 2023-10-13 LAB — BASIC METABOLIC PANEL WITH GFR
BUN/Creatinine Ratio: 9 — ABNORMAL LOW (ref 12–28)
BUN: 10 mg/dL (ref 8–27)
CO2: 22 mmol/L (ref 20–29)
Calcium: 9.1 mg/dL (ref 8.7–10.3)
Chloride: 102 mmol/L (ref 96–106)
Creatinine, Ser: 1.06 mg/dL — ABNORMAL HIGH (ref 0.57–1.00)
Glucose: 149 mg/dL — ABNORMAL HIGH (ref 70–99)
Potassium: 3.6 mmol/L (ref 3.5–5.2)
Sodium: 142 mmol/L (ref 134–144)
eGFR: 57 mL/min/1.73 — ABNORMAL LOW (ref >59)

## 2023-10-13 LAB — CBC WITH DIFFERENTIAL/PLATELET
Basophils Absolute: 0 x10E3/uL (ref 0.0–0.2)
Basos: 1 %
EOS (ABSOLUTE): 0.2 x10E3/uL (ref 0.0–0.4)
Eos: 2 %
Hematocrit: 37.6 % (ref 34.0–46.6)
Hemoglobin: 11.7 g/dL (ref 11.1–15.9)
Immature Grans (Abs): 0 x10E3/uL (ref 0.0–0.1)
Immature Granulocytes: 0 %
Lymphocytes Absolute: 1.7 x10E3/uL (ref 0.7–3.1)
Lymphs: 26 %
MCH: 27.8 pg (ref 26.6–33.0)
MCHC: 31.1 g/dL — ABNORMAL LOW (ref 31.5–35.7)
MCV: 89 fL (ref 79–97)
Monocytes Absolute: 0.6 x10E3/uL (ref 0.1–0.9)
Monocytes: 9 %
Neutrophils Absolute: 4 x10E3/uL (ref 1.4–7.0)
Neutrophils: 61 %
Platelets: 233 x10E3/uL (ref 150–450)
RBC: 4.21 x10E6/uL (ref 3.77–5.28)
RDW: 15.8 % — ABNORMAL HIGH (ref 11.7–15.4)
WBC: 6.5 x10E3/uL (ref 3.4–10.8)

## 2023-10-13 LAB — MAGNESIUM: Magnesium: 1.8 mg/dL (ref 1.6–2.3)

## 2023-10-13 NOTE — Patient Instructions (Signed)
 Visit Information  Thank you for taking time to visit with me today. Please don't hesitate to contact me if I can be of assistance to you before our next scheduled telephone appointment.  Our next appointment is by telephone on 10/21/23 @ 945 am  Following is a copy of your care plan:   Goals Addressed             This Visit's Progress    VBCI Transitions of Care (TOC) Care Plan       Problems:  Recent Hospitalization for treatment of Angioedema Equipment/DME barrier Patient would like a power chair to aide in mobility and independence Pt saw primary care provider 10/12/23, unsure if power chair was discussed, will talk with daughter Pt is checking blood pressure few times per week  Goal:  Over the next 30 days, the patient will not experience hospital readmission  Interventions:  Transitions of Care: Doctor Visits  - discussed the importance of doctor visits Post discharge activity limitations prescribed by provider reviewed Medication reviewed, discussed discontinued Benazepril  Advised patient to contact her insurance provider to inquire about coverage for power chair Advised patient to discuss power chair with PCP during upcoming visit Reinforced the importance of patient checking her BP daily, recording and taking to provider visits Reviewed signs/ symptoms angioedema, importance of seeking emergency care  Patient Self Care Activities:  Attend all scheduled provider appointments Call provider office for new concerns or questions  Notify RN Care Manager of TOC call rescheduling needs Participate in Transition of Care Program/Attend TOC scheduled calls Take medications as prescribed   Monitor BP, record and take to provider appointments Please discuss the need for a power chair with your doctor  Plan:  Telephone follow up appointment with care management team member scheduled for:  10/21/23 @ 945 am        Patient verbalizes understanding of instructions and care plan  provided today and agrees to view in MyChart. Active MyChart status and patient understanding of how to access instructions and care plan via MyChart confirmed with patient.     Telephone follow up appointment with care management team member scheduled for:  10/21/23 @ 945 am  Please call the care guide team at [phone] if you need to cancel or reschedule your appointment.   Please call the Suicide and Crisis Lifeline: 988 call the [place]  National Suicide Prevention Lifeline: 202-435-5664 or TTY: (843)511-3752 TTY 5851615547) to talk to a trained counselor call 1-800-273-TALK (toll free, 24 hour hotline) go to [facility] [address], [place] [phone]) call the [facility] Crisis Line: [phone] call 911 if you are experiencing a Mental Health or Behavioral Health Crisis or need someone to talk to.  Mliss Creed [facility], BSN RN Care Manager/ Transition of Care [place]/ [facility] [phone]

## 2023-10-13 NOTE — Patient Outreach (Signed)
 Transition of Care week 2  Visit Note  10/13/2023  Name: [name] MRN: [id number]          DOB: September 09, 1955  Situation: Patient enrolled in [facility] 30-day program. Visit completed with patient by telephone.   Background:   Initial Transition Care Management Follow-up Telephone Call    Past Medical History:  Diagnosis Date   Asthma    prn neb. and inhaler   Breast cancer (HCC) 01/13/2013   right   Dehydration 04/13/2013   GERD (gastroesophageal reflux disease)    Graves' disease    Hyperlipidemia    Hypertension    on multiple meds., has been on med. > 14 yr.   Non-insulin  dependent type 2 diabetes mellitus (HCC)    Obesity    Ophthalmic manifestation of Graves disease    Osteoarthritis 01/13/2001   left knee   Radiation 10/11/13-11/29/13   Right Breast   Sleep apnea 04/14/2014   mod  , wears cpap 10 , ramps from 4 , nasal mask   Wears glasses     Assessment: Patient Reported Symptoms: Cognitive Cognitive Status: No symptoms reported, Alert and oriented to person, place, and time, Normal speech and language skills, Able to follow simple commands      Neurological Neurological Review of Symptoms: No symptoms reported    HEENT HEENT Symptoms Reported: No symptoms reported      Cardiovascular Cardiovascular Symptoms Reported: No symptoms reported Does patient have uncontrolled Hypertension?: No Cardiovascular Management Strategies: Adequate rest, Medication therapy Cardiovascular Self-Management Outcome: 4 (good) Cardiovascular Comment: BP today 123/75  Respiratory Respiratory Symptoms Reported: No symptoms reported    Endocrine Endocrine Symptoms Reported: No symptoms reported Is patient diabetic?: Yes Is patient checking blood sugars at home?: Yes List most recent blood sugar readings, include date and time of day: Dexcom CGM   FBS today 145 Endocrine Self-Management Outcome: 4 (good)  Gastrointestinal Gastrointestinal Symptoms Reported: No symptoms  reported      Genitourinary Genitourinary Symptoms Reported: No symptoms reported    Integumentary Integumentary Symptoms Reported: No symptoms reported    Musculoskeletal Musculoskelatal Symptoms Reviewed: Limited mobility Additional Musculoskeletal Details: pt has not walked in 2.5 years Musculoskeletal Management Strategies: Medical device, Adequate rest, Routine screening (uses wheelchair and hoyer lift) Musculoskeletal Self-Management Outcome: 3 (uncertain) Musculoskeletal Comment: [facility] continues working with pt,  reviewed safety precautions      Psychosocial Psychosocial Symptoms Reported: No symptoms reported         Vitals:   10/13/23 1441  BP: 123/75    Medications Reviewed Today     Reviewed by Aura Mliss LABOR, RN (Registered Nurse) on 10/13/23 at 1439  Med List Status: <None>   Medication Order Taking? Sig Documenting Provider Last Dose Status Informant  acetaminophen  (TYLENOL ) 325 MG tablet [id number]  Take 650 mg by mouth every 6 (six) hours as needed for mild pain (pain score 1-3) or moderate pain (pain score 4-6).  Patient taking differently: Take 2,000 mg by mouth 3 (three) times daily.   [provider]  Active Pharmacy Records, Multiple Informants, Family Member  albuterol  (PROVENTIL ) (2.5 MG/3ML) 0.083% nebulizer solution [id number]  USE 1 VIAL IN NEBULIZER EVERY 6 HOURS AS NEEDED FOR WHEEZING OR SHORTNESS OF BREATH. Cook, Jayce G, DO  Active Pharmacy Records, Multiple Informants, Family Member  amLODipine  (NORVASC ) 10 MG tablet [id number]  TAKE (1) TABLET BY MOUTH ONCE DAILY. Cook, Jayce G, DO  Active Pharmacy Records, Multiple Informants, Family Member  apixaban  (ELIQUIS ) 5 MG TABS  tablet [id number]  Take 1 tablet (5 mg total) by mouth 2 (two) times daily. Cook, Jayce G, DO  Active Pharmacy Records, Multiple Informants, Family Member  carvedilol  (COREG ) 12.5 MG tablet [id number]  TAKE 1 TABLET BY MOUTH 2 TIMES DAILY WITH A MEAL. Cook, Jayce G, DO   Active Pharmacy Records, Multiple Informants, Family Member  cholecalciferol  (VITAMIN D3) 25 MCG (1000 UNIT) tablet [id number]  Take 1,000 Units by mouth daily. [provider]  Active Pharmacy Records, Multiple Informants, Family Member  Continuous Glucose Sensor (DEXCOM G7 SENSOR) [place] [id number]  USE TO MONITOR BLOOD SUGAR. CHANGE EVERY 10 DAYS. [provider]  Active Pharmacy Records, Multiple Informants, Family Member  dapagliflozin  propanediol (FARXIGA ) 5 MG TABS tablet [id number]  Take 1 tablet (5 mg total) by mouth daily. Cook, Jayce G, DO  Active Pharmacy Records, Multiple Informants, Family Member           Med Note STEFFI, ADELITA   Mon Sep 28, 2023 10:46 AM) New medication started on 09/24/2023   diclofenac  Sodium (VOLTAREN  ARTHRITIS PAIN) 1 % GEL [id number]  Apply 2 g topically 4 (four) times daily as needed (arthritis pain). [provider]  Active Pharmacy Records, Multiple Informants, Family Member  diphenhydrAMINE  (BENADRYL ) 25 mg capsule [id number]  Take 25 mg by mouth daily at 12 noon.  Patient not taking: Reported on 10/05/2023   [provider]  Active Pharmacy Records, Multiple Informants, Family Member  fluticasone  (FLOVENT  HFA) 44 MCG/ACT inhaler [id number]  Inhale 2 puffs into the lungs 2 (two) times daily.  Patient taking differently: Inhale 2 puffs into the lungs daily as needed (shortness of breath).   Alphonsa Elsie RAMAN, MD  Active Pharmacy Records, Multiple Informants, Family Member  glucose blood [facility] ULTRA) test strip [id number]  USE TO TEST BLOOD SUGAR 4 TIMES DAILY AS DIRECTED. Cook, Jayce G, DO  Active Pharmacy Records, Multiple Informants, Family Member  HUMALOG MIX 75/25 KWIKPEN (75-25) 100 UNIT/ML Kary [id number]  Inject 12 Units into the skin in the morning and at bedtime. [provider]  Active Pharmacy Records, Multiple Informants, Family Member           Med Note DELBERTA, [place] C   Dju Aug 31, 2021  1:11 PM)     Insulin  Pen Needle 31G X 8 MM MISC [id number]  Use to inject insulin  one time daily [provider]  Active Pharmacy Records, Multiple Informants, Family Member           Med Note DELBERTA, [place] C   Dju Aug 31, 2021  1:11 PM)    levothyroxine  (SYNTHROID ) 137 MCG tablet [id number]  Take 137 mcg by mouth daily before breakfast. [provider]  Active Pharmacy Records, Multiple Informants, Family Member  MOUNJARO 10 MG/0.5ML Pen [id number]  Inject 10 mg into the skin once a week. [provider]  Active Pharmacy Records, Multiple Informants, Family Member  polyethylene glycol (MIRALAX  / GLYCOLAX ) 17 g packet [id number]  Take 17 g by mouth daily as needed for mild constipation. Maree Adron BIRCH, DO  Active Pharmacy Records, Multiple Informants, Family Member  pravastatin  (PRAVACHOL ) 40 MG tablet [id number]  TAKE ONE TABLET BY MOUTH AT BEDTIME Cook, Jayce G, DO  Active Pharmacy Records, Multiple Informants, Family Member  pregabalin  (LYRICA ) 100 MG capsule [id number]  Take 100 mg by mouth 3 (three) times daily. [provider]  Active Pharmacy Records, Multiple Informants, Family Member  traMADol  (ULTRAM ) 50 MG tablet [id number]  Take 1-1.5 tablets (50-75 mg total)  by mouth every 8 (eight) hours as needed for moderate pain (pain score 4-6) or severe pain (pain score 7-10). Cook, Jayce G, DO  Active Pharmacy Records, Multiple Informants, Family Member  triamcinolone  cream (KENALOG ) 0.1 % [id number]  APPLY TO AFFECTED AREAS TWICE DAILY PRN Mauro Elveria BROCKS, NP  Active Pharmacy Records, Multiple Informants, Family Member            Goals Addressed             This Visit's Progress    VBCI Transitions of Care (TOC) Care Plan       Problems:  Recent Hospitalization for treatment of Angioedema Equipment/DME barrier Patient would like a power chair to aide in mobility and independence Pt saw primary care provider 10/12/23, unsure if power chair was discussed, will talk  with daughter Pt is checking blood pressure few times per week  Goal:  Over the next 30 days, the patient will not experience hospital readmission  Interventions:  Transitions of Care: Doctor Visits  - discussed the importance of doctor visits Post discharge activity limitations prescribed by provider reviewed Medication reviewed, discussed discontinued Benazepril  Advised patient to contact her insurance provider to inquire about coverage for power chair Advised patient to discuss power chair with PCP during upcoming visit Reinforced the importance of patient checking her BP daily, recording and taking to provider visits Reviewed signs/ symptoms angioedema, importance of seeking emergency care  Patient Self Care Activities:  Attend all scheduled provider appointments Call provider office for new concerns or questions  Notify RN Care Manager of [facility] call rescheduling needs Participate in Transition of Care Program/Attend TOC scheduled calls Take medications as prescribed   Monitor BP, record and take to provider appointments Please discuss the need for a power chair with your doctor  Plan:  Telephone follow up appointment with care management team member scheduled for:  10/21/23 @ 945 am        Recommendation:   PCP Follow-up  Follow Up Plan:   Telephone follow-up 10/21/23 @ 945 am  Mliss Creed [facility], BSN RN Care Manager/ Transition of Care [place]/ [name] [phone]

## 2023-10-14 ENCOUNTER — Ambulatory Visit: Payer: Self-pay | Admitting: Family Medicine

## 2023-10-14 DIAGNOSIS — Z7952 Long term (current) use of systemic steroids: Secondary | ICD-10-CM | POA: Diagnosis not present

## 2023-10-14 DIAGNOSIS — Z6841 Body Mass Index (BMI) 40.0 and over, adult: Secondary | ICD-10-CM | POA: Diagnosis not present

## 2023-10-14 DIAGNOSIS — E785 Hyperlipidemia, unspecified: Secondary | ICD-10-CM | POA: Diagnosis not present

## 2023-10-14 DIAGNOSIS — I10 Essential (primary) hypertension: Secondary | ICD-10-CM | POA: Diagnosis not present

## 2023-10-14 DIAGNOSIS — Z853 Personal history of malignant neoplasm of breast: Secondary | ICD-10-CM | POA: Diagnosis not present

## 2023-10-14 DIAGNOSIS — Z9181 History of falling: Secondary | ICD-10-CM | POA: Diagnosis not present

## 2023-10-14 DIAGNOSIS — Z794 Long term (current) use of insulin: Secondary | ICD-10-CM | POA: Diagnosis not present

## 2023-10-14 DIAGNOSIS — E66813 Obesity, class 3: Secondary | ICD-10-CM | POA: Diagnosis not present

## 2023-10-14 DIAGNOSIS — I493 Ventricular premature depolarization: Secondary | ICD-10-CM | POA: Diagnosis not present

## 2023-10-14 DIAGNOSIS — Z7985 Long-term (current) use of injectable non-insulin antidiabetic drugs: Secondary | ICD-10-CM | POA: Diagnosis not present

## 2023-10-14 DIAGNOSIS — I4892 Unspecified atrial flutter: Secondary | ICD-10-CM | POA: Diagnosis not present

## 2023-10-14 DIAGNOSIS — Z7951 Long term (current) use of inhaled steroids: Secondary | ICD-10-CM | POA: Diagnosis not present

## 2023-10-14 DIAGNOSIS — Z7901 Long term (current) use of anticoagulants: Secondary | ICD-10-CM | POA: Diagnosis not present

## 2023-10-14 DIAGNOSIS — G4733 Obstructive sleep apnea (adult) (pediatric): Secondary | ICD-10-CM | POA: Diagnosis not present

## 2023-10-14 DIAGNOSIS — Z96652 Presence of left artificial knee joint: Secondary | ICD-10-CM | POA: Diagnosis not present

## 2023-10-14 DIAGNOSIS — T783XXD Angioneurotic edema, subsequent encounter: Secondary | ICD-10-CM | POA: Diagnosis not present

## 2023-10-14 DIAGNOSIS — J45909 Unspecified asthma, uncomplicated: Secondary | ICD-10-CM | POA: Diagnosis not present

## 2023-10-14 DIAGNOSIS — E890 Postprocedural hypothyroidism: Secondary | ICD-10-CM | POA: Diagnosis not present

## 2023-10-14 DIAGNOSIS — G8929 Other chronic pain: Secondary | ICD-10-CM | POA: Diagnosis not present

## 2023-10-14 DIAGNOSIS — E119 Type 2 diabetes mellitus without complications: Secondary | ICD-10-CM | POA: Diagnosis not present

## 2023-10-15 ENCOUNTER — Telehealth: Payer: Self-pay | Admitting: *Deleted

## 2023-10-15 DIAGNOSIS — Z794 Long term (current) use of insulin: Secondary | ICD-10-CM | POA: Diagnosis not present

## 2023-10-15 DIAGNOSIS — G8929 Other chronic pain: Secondary | ICD-10-CM | POA: Diagnosis not present

## 2023-10-15 DIAGNOSIS — Z7985 Long-term (current) use of injectable non-insulin antidiabetic drugs: Secondary | ICD-10-CM | POA: Diagnosis not present

## 2023-10-15 DIAGNOSIS — J45909 Unspecified asthma, uncomplicated: Secondary | ICD-10-CM | POA: Diagnosis not present

## 2023-10-15 DIAGNOSIS — I493 Ventricular premature depolarization: Secondary | ICD-10-CM | POA: Diagnosis not present

## 2023-10-15 DIAGNOSIS — E890 Postprocedural hypothyroidism: Secondary | ICD-10-CM | POA: Diagnosis not present

## 2023-10-15 DIAGNOSIS — E785 Hyperlipidemia, unspecified: Secondary | ICD-10-CM | POA: Diagnosis not present

## 2023-10-15 DIAGNOSIS — T783XXD Angioneurotic edema, subsequent encounter: Secondary | ICD-10-CM | POA: Diagnosis not present

## 2023-10-15 DIAGNOSIS — Z7951 Long term (current) use of inhaled steroids: Secondary | ICD-10-CM | POA: Diagnosis not present

## 2023-10-15 DIAGNOSIS — Z6841 Body Mass Index (BMI) 40.0 and over, adult: Secondary | ICD-10-CM | POA: Diagnosis not present

## 2023-10-15 DIAGNOSIS — I4892 Unspecified atrial flutter: Secondary | ICD-10-CM | POA: Diagnosis not present

## 2023-10-15 DIAGNOSIS — Z7952 Long term (current) use of systemic steroids: Secondary | ICD-10-CM | POA: Diagnosis not present

## 2023-10-15 DIAGNOSIS — E119 Type 2 diabetes mellitus without complications: Secondary | ICD-10-CM | POA: Diagnosis not present

## 2023-10-15 DIAGNOSIS — E66813 Obesity, class 3: Secondary | ICD-10-CM | POA: Diagnosis not present

## 2023-10-15 DIAGNOSIS — Z9181 History of falling: Secondary | ICD-10-CM | POA: Diagnosis not present

## 2023-10-15 DIAGNOSIS — I10 Essential (primary) hypertension: Secondary | ICD-10-CM | POA: Diagnosis not present

## 2023-10-15 DIAGNOSIS — Z853 Personal history of malignant neoplasm of breast: Secondary | ICD-10-CM | POA: Diagnosis not present

## 2023-10-15 DIAGNOSIS — Z96652 Presence of left artificial knee joint: Secondary | ICD-10-CM | POA: Diagnosis not present

## 2023-10-15 DIAGNOSIS — Z7901 Long term (current) use of anticoagulants: Secondary | ICD-10-CM | POA: Diagnosis not present

## 2023-10-15 DIAGNOSIS — G4733 Obstructive sleep apnea (adult) (pediatric): Secondary | ICD-10-CM | POA: Diagnosis not present

## 2023-10-15 NOTE — Telephone Encounter (Signed)
 Copied from CRM #[id number]. Topic: General - Other >> Oct 15, 2023  8:18 AM Willma SAUNDERS wrote: Reason for CRM: Rosina from [facility] calling to let Dr Bluford know patient was scheduled to get Occupational Therapy today and she canceled. Had back to back sessions with PT and she is tired and felt like she couldn't do it.  Ashely can be reached at [phone]

## 2023-10-19 DIAGNOSIS — T783XXD Angioneurotic edema, subsequent encounter: Secondary | ICD-10-CM | POA: Diagnosis not present

## 2023-10-19 DIAGNOSIS — Z9181 History of falling: Secondary | ICD-10-CM | POA: Diagnosis not present

## 2023-10-19 DIAGNOSIS — I493 Ventricular premature depolarization: Secondary | ICD-10-CM | POA: Diagnosis not present

## 2023-10-19 DIAGNOSIS — Z7951 Long term (current) use of inhaled steroids: Secondary | ICD-10-CM | POA: Diagnosis not present

## 2023-10-19 DIAGNOSIS — G4733 Obstructive sleep apnea (adult) (pediatric): Secondary | ICD-10-CM | POA: Diagnosis not present

## 2023-10-19 DIAGNOSIS — Z96652 Presence of left artificial knee joint: Secondary | ICD-10-CM | POA: Diagnosis not present

## 2023-10-19 DIAGNOSIS — Z794 Long term (current) use of insulin: Secondary | ICD-10-CM | POA: Diagnosis not present

## 2023-10-19 DIAGNOSIS — I4892 Unspecified atrial flutter: Secondary | ICD-10-CM | POA: Diagnosis not present

## 2023-10-19 DIAGNOSIS — G8929 Other chronic pain: Secondary | ICD-10-CM | POA: Diagnosis not present

## 2023-10-19 DIAGNOSIS — Z853 Personal history of malignant neoplasm of breast: Secondary | ICD-10-CM | POA: Diagnosis not present

## 2023-10-19 DIAGNOSIS — E785 Hyperlipidemia, unspecified: Secondary | ICD-10-CM | POA: Diagnosis not present

## 2023-10-19 DIAGNOSIS — E66813 Obesity, class 3: Secondary | ICD-10-CM | POA: Diagnosis not present

## 2023-10-19 DIAGNOSIS — E890 Postprocedural hypothyroidism: Secondary | ICD-10-CM | POA: Diagnosis not present

## 2023-10-19 DIAGNOSIS — Z7952 Long term (current) use of systemic steroids: Secondary | ICD-10-CM | POA: Diagnosis not present

## 2023-10-19 DIAGNOSIS — Z7985 Long-term (current) use of injectable non-insulin antidiabetic drugs: Secondary | ICD-10-CM | POA: Diagnosis not present

## 2023-10-19 DIAGNOSIS — Z7901 Long term (current) use of anticoagulants: Secondary | ICD-10-CM | POA: Diagnosis not present

## 2023-10-19 DIAGNOSIS — I10 Essential (primary) hypertension: Secondary | ICD-10-CM | POA: Diagnosis not present

## 2023-10-19 DIAGNOSIS — E119 Type 2 diabetes mellitus without complications: Secondary | ICD-10-CM | POA: Diagnosis not present

## 2023-10-19 DIAGNOSIS — J45909 Unspecified asthma, uncomplicated: Secondary | ICD-10-CM | POA: Diagnosis not present

## 2023-10-19 DIAGNOSIS — Z6841 Body Mass Index (BMI) 40.0 and over, adult: Secondary | ICD-10-CM | POA: Diagnosis not present

## 2023-10-20 DIAGNOSIS — E66813 Obesity, class 3: Secondary | ICD-10-CM | POA: Diagnosis not present

## 2023-10-20 DIAGNOSIS — I493 Ventricular premature depolarization: Secondary | ICD-10-CM | POA: Diagnosis not present

## 2023-10-20 DIAGNOSIS — J45909 Unspecified asthma, uncomplicated: Secondary | ICD-10-CM | POA: Diagnosis not present

## 2023-10-20 DIAGNOSIS — T783XXD Angioneurotic edema, subsequent encounter: Secondary | ICD-10-CM | POA: Diagnosis not present

## 2023-10-20 DIAGNOSIS — G8929 Other chronic pain: Secondary | ICD-10-CM | POA: Diagnosis not present

## 2023-10-20 DIAGNOSIS — Z6841 Body Mass Index (BMI) 40.0 and over, adult: Secondary | ICD-10-CM | POA: Diagnosis not present

## 2023-10-20 DIAGNOSIS — Z7901 Long term (current) use of anticoagulants: Secondary | ICD-10-CM | POA: Diagnosis not present

## 2023-10-20 DIAGNOSIS — E890 Postprocedural hypothyroidism: Secondary | ICD-10-CM | POA: Diagnosis not present

## 2023-10-20 DIAGNOSIS — Z794 Long term (current) use of insulin: Secondary | ICD-10-CM | POA: Diagnosis not present

## 2023-10-20 DIAGNOSIS — Z9181 History of falling: Secondary | ICD-10-CM | POA: Diagnosis not present

## 2023-10-20 DIAGNOSIS — Z853 Personal history of malignant neoplasm of breast: Secondary | ICD-10-CM | POA: Diagnosis not present

## 2023-10-20 DIAGNOSIS — E119 Type 2 diabetes mellitus without complications: Secondary | ICD-10-CM | POA: Diagnosis not present

## 2023-10-20 DIAGNOSIS — Z96652 Presence of left artificial knee joint: Secondary | ICD-10-CM | POA: Diagnosis not present

## 2023-10-20 DIAGNOSIS — I10 Essential (primary) hypertension: Secondary | ICD-10-CM | POA: Diagnosis not present

## 2023-10-20 DIAGNOSIS — I4892 Unspecified atrial flutter: Secondary | ICD-10-CM | POA: Diagnosis not present

## 2023-10-20 DIAGNOSIS — Z7951 Long term (current) use of inhaled steroids: Secondary | ICD-10-CM | POA: Diagnosis not present

## 2023-10-20 DIAGNOSIS — G4733 Obstructive sleep apnea (adult) (pediatric): Secondary | ICD-10-CM | POA: Diagnosis not present

## 2023-10-20 DIAGNOSIS — Z7985 Long-term (current) use of injectable non-insulin antidiabetic drugs: Secondary | ICD-10-CM | POA: Diagnosis not present

## 2023-10-20 DIAGNOSIS — Z7952 Long term (current) use of systemic steroids: Secondary | ICD-10-CM | POA: Diagnosis not present

## 2023-10-20 DIAGNOSIS — E785 Hyperlipidemia, unspecified: Secondary | ICD-10-CM | POA: Diagnosis not present

## 2023-10-21 ENCOUNTER — Other Ambulatory Visit: Payer: Self-pay | Admitting: *Deleted

## 2023-10-21 DIAGNOSIS — Z7901 Long term (current) use of anticoagulants: Secondary | ICD-10-CM | POA: Diagnosis not present

## 2023-10-21 DIAGNOSIS — T783XXD Angioneurotic edema, subsequent encounter: Secondary | ICD-10-CM | POA: Diagnosis not present

## 2023-10-21 DIAGNOSIS — Z7985 Long-term (current) use of injectable non-insulin antidiabetic drugs: Secondary | ICD-10-CM | POA: Diagnosis not present

## 2023-10-21 DIAGNOSIS — Z794 Long term (current) use of insulin: Secondary | ICD-10-CM | POA: Diagnosis not present

## 2023-10-21 DIAGNOSIS — I10 Essential (primary) hypertension: Secondary | ICD-10-CM | POA: Diagnosis not present

## 2023-10-21 DIAGNOSIS — I4892 Unspecified atrial flutter: Secondary | ICD-10-CM | POA: Diagnosis not present

## 2023-10-21 DIAGNOSIS — I493 Ventricular premature depolarization: Secondary | ICD-10-CM | POA: Diagnosis not present

## 2023-10-21 DIAGNOSIS — E890 Postprocedural hypothyroidism: Secondary | ICD-10-CM | POA: Diagnosis not present

## 2023-10-21 DIAGNOSIS — Z6841 Body Mass Index (BMI) 40.0 and over, adult: Secondary | ICD-10-CM | POA: Diagnosis not present

## 2023-10-21 DIAGNOSIS — Z853 Personal history of malignant neoplasm of breast: Secondary | ICD-10-CM | POA: Diagnosis not present

## 2023-10-21 DIAGNOSIS — G8929 Other chronic pain: Secondary | ICD-10-CM | POA: Diagnosis not present

## 2023-10-21 DIAGNOSIS — E66813 Obesity, class 3: Secondary | ICD-10-CM | POA: Diagnosis not present

## 2023-10-21 DIAGNOSIS — Z96652 Presence of left artificial knee joint: Secondary | ICD-10-CM | POA: Diagnosis not present

## 2023-10-21 DIAGNOSIS — Z7951 Long term (current) use of inhaled steroids: Secondary | ICD-10-CM | POA: Diagnosis not present

## 2023-10-21 DIAGNOSIS — E119 Type 2 diabetes mellitus without complications: Secondary | ICD-10-CM | POA: Diagnosis not present

## 2023-10-21 DIAGNOSIS — Z9181 History of falling: Secondary | ICD-10-CM | POA: Diagnosis not present

## 2023-10-21 DIAGNOSIS — E785 Hyperlipidemia, unspecified: Secondary | ICD-10-CM | POA: Diagnosis not present

## 2023-10-21 DIAGNOSIS — G4733 Obstructive sleep apnea (adult) (pediatric): Secondary | ICD-10-CM | POA: Diagnosis not present

## 2023-10-21 DIAGNOSIS — J45909 Unspecified asthma, uncomplicated: Secondary | ICD-10-CM | POA: Diagnosis not present

## 2023-10-21 DIAGNOSIS — Z7952 Long term (current) use of systemic steroids: Secondary | ICD-10-CM | POA: Diagnosis not present

## 2023-10-21 NOTE — Patient Instructions (Signed)
 Visit Information  Thank you for taking time to visit with me today. Please don't hesitate to contact me if I can be of assistance to you before our next scheduled telephone appointment.  Our next appointment is by telephone on 10/28/23 @ 115 pm  Following is a copy of your care plan:   Goals Addressed             This Visit's Progress    VBCI Transitions of Care (TOC) Care Plan       Problems:  Recent Hospitalization for treatment of Angioedema Equipment/DME barrier Patient would like a power chair to aide in mobility and independence Pt saw primary care provider 10/12/23, unsure if power chair was discussed, will talk with daughter Pt is checking blood pressure few times per week, [facility] also monitoring per pt,  CBG fasting ranges 137-157, pt reports  doing well, no concerns reported  Goal:  Over the next 30 days, the patient will not experience hospital readmission  Interventions:  Transitions of Care: Doctor Visits  - discussed the importance of doctor visits Post discharge activity limitations prescribed by provider reviewed Medication reviewed, discussed discontinued Benazepril  Advised patient to contact her insurance provider to inquire about coverage for power chair Advised patient to discuss power chair with PCP  Reinforced the importance of patient checking her BP daily, recording and taking to provider visits Reinforced signs/ symptoms angioedema, importance of seeking emergency care  Patient Self Care Activities:  Attend all scheduled provider appointments Call provider office for new concerns or questions  Notify RN Care Manager of TOC call rescheduling needs Participate in Transition of Care Program/Attend TOC scheduled calls Take medications as prescribed   Monitor BP, record and take to provider appointments Please discuss the need for a power chair with your doctor  Plan:  Telephone follow up appointment with care management team member  scheduled for:  10/28/23 @ 115 pm        Patient verbalizes understanding of instructions and care plan provided today and agrees to view in MyChart. Active MyChart status and patient understanding of how to access instructions and care plan via MyChart confirmed with patient.     Telephone follow up appointment with care management team member scheduled for: 10/28/23 @ 115 pm  Please call the care guide team at [phone] if you need to cancel or reschedule your appointment.   Please call the Suicide and Crisis Lifeline: 988 call the [place]  National Suicide Prevention Lifeline: 4010547933 or TTY: 214 421 6446 TTY 765-017-5762) to talk to a trained counselor call 1-800-273-TALK (toll free, 24 hour hotline) go to [facility] [address], [place] [phone]) call the [facility] Crisis Line: [phone] call 911 if you are experiencing a Mental Health or Behavioral Health Crisis or need someone to talk to.  Mliss Creed [facility], BSN RN Care Manager/ Transition of Care [place]/ [facility] [phone]

## 2023-10-21 NOTE — Patient Outreach (Signed)
 Transition of Care week 3  Visit Note  10/21/2023  Name: [name] MRN: [id number]          DOB: 09/27/1955  Situation: Patient enrolled in [facility] 30-day program. Visit completed with patient by telephone.   Background:    Past Medical History:  Diagnosis Date   Asthma    prn neb. and inhaler   Breast cancer (HCC) 01/13/2013   right   Dehydration 04/13/2013   GERD (gastroesophageal reflux disease)    Graves' disease    Hyperlipidemia    Hypertension    on multiple meds., has been on med. > 14 yr.   Non-insulin  dependent type 2 diabetes mellitus (HCC)    Obesity    Ophthalmic manifestation of Graves disease    Osteoarthritis 01/13/2001   left knee   Radiation 10/11/13-11/29/13   Right Breast   Sleep apnea 04/14/2014   mod  , wears cpap 10 , ramps from 4 , nasal mask   Wears glasses     Assessment: Patient Reported Symptoms: Cognitive Cognitive Status: No symptoms reported, Alert and oriented to person, place, and time, Normal speech and language skills, Able to follow simple commands      Neurological Neurological Review of Symptoms: No symptoms reported    HEENT HEENT Symptoms Reported: No symptoms reported      Cardiovascular Cardiovascular Symptoms Reported: No symptoms reported Does patient have uncontrolled Hypertension?: No Cardiovascular Management Strategies: Adequate rest, Medication therapy, Routine screening Cardiovascular Self-Management Outcome: 4 (good) Cardiovascular Comment: recent BP 128/70  Respiratory Respiratory Symptoms Reported: No symptoms reported    Endocrine Endocrine Symptoms Reported: No symptoms reported Is patient diabetic?: Yes Is patient checking blood sugars at home?: Yes List most recent blood sugar readings, include date and time of day: Dexcom G7  FBS ranges 137-157 Endocrine Self-Management Outcome: 4 (good)  Gastrointestinal Gastrointestinal Symptoms Reported: No symptoms reported      Genitourinary Genitourinary  Symptoms Reported: No symptoms reported    Integumentary Integumentary Symptoms Reported: No symptoms reported    Musculoskeletal Musculoskelatal Symptoms Reviewed: Limited mobility Additional Musculoskeletal Details: pt has not walked in 2.5 years Musculoskeletal Management Strategies: Adequate rest, Medical device, Routine screening Musculoskeletal Self-Management Outcome: 3 (uncertain) Musculoskeletal Comment: [facility] is working with pt,  reinforced safety precautions      Psychosocial Psychosocial Symptoms Reported: No symptoms reported         There were no vitals filed for this visit.  Medications Reviewed Today     Reviewed by Aura Mliss LABOR, RN (Registered Nurse) on 10/21/23 at 1032  Med List Status: <None>   Medication Order Taking? Sig Documenting Provider Last Dose Status Informant  acetaminophen  (TYLENOL ) 325 MG tablet [id number]  Take 650 mg by mouth every 6 (six) hours as needed for mild pain (pain score 1-3) or moderate pain (pain score 4-6).  Patient taking differently: Take 2,000 mg by mouth 3 (three) times daily.   [provider]  Active Pharmacy Records, Multiple Informants, Family Member  albuterol  (PROVENTIL ) (2.5 MG/3ML) 0.083% nebulizer solution [id number]  USE 1 VIAL IN NEBULIZER EVERY 6 HOURS AS NEEDED FOR WHEEZING OR SHORTNESS OF BREATH. Cook, Jayce G, DO  Active Pharmacy Records, Multiple Informants, Family Member  amLODipine  (NORVASC ) 10 MG tablet [id number]  TAKE (1) TABLET BY MOUTH ONCE DAILY. Cook, Jayce G, DO  Active Pharmacy Records, Multiple Informants, Family Member  apixaban  (ELIQUIS ) 5 MG TABS tablet [id number]  Take 1 tablet (5 mg total) by mouth 2 (two) times daily.  Cook, Jayce G, DO  Active Pharmacy Records, Multiple Informants, Family Member  carvedilol  (COREG ) 12.5 MG tablet [id number]  TAKE 1 TABLET BY MOUTH 2 TIMES DAILY WITH A MEAL. Cook, Jayce G, DO  Active Pharmacy Records, Multiple Informants, Family Member  cholecalciferol   (VITAMIN D3) 25 MCG (1000 UNIT) tablet [id number]  Take 1,000 Units by mouth daily. [provider]  Active Pharmacy Records, Multiple Informants, Family Member  Continuous Glucose Sensor (DEXCOM G7 SENSOR) [place] [id number]  USE TO MONITOR BLOOD SUGAR. CHANGE EVERY 10 DAYS. [provider]  Active Pharmacy Records, Multiple Informants, Family Member  dapagliflozin  propanediol (FARXIGA ) 5 MG TABS tablet [id number]  Take 1 tablet (5 mg total) by mouth daily. Cook, Jayce G, DO  Active Pharmacy Records, Multiple Informants, Family Member           Med Note STEFFI, ADELITA   Mon Sep 28, 2023 10:46 AM) New medication started on 09/24/2023   diclofenac  Sodium (VOLTAREN  ARTHRITIS PAIN) 1 % GEL [id number]  Apply 2 g topically 4 (four) times daily as needed (arthritis pain). [provider]  Active Pharmacy Records, Multiple Informants, Family Member  diphenhydrAMINE  (BENADRYL ) 25 mg capsule [id number]  Take 25 mg by mouth daily at 12 noon.  Patient not taking: Reported on 10/05/2023   [provider]  Active Pharmacy Records, Multiple Informants, Family Member  fluticasone  (FLOVENT  HFA) 44 MCG/ACT inhaler [id number]  Inhale 2 puffs into the lungs 2 (two) times daily.  Patient taking differently: Inhale 2 puffs into the lungs daily as needed (shortness of breath).   Alphonsa Elsie RAMAN, MD  Active Pharmacy Records, Multiple Informants, Family Member  glucose blood [facility] ULTRA) test strip [id number]  USE TO TEST BLOOD SUGAR 4 TIMES DAILY AS DIRECTED. Cook, Jayce G, DO  Active Pharmacy Records, Multiple Informants, Family Member  HUMALOG MIX 75/25 KWIKPEN (75-25) 100 UNIT/ML Kary [id number]  Inject 12 Units into the skin in the morning and at bedtime. [provider]  Active Pharmacy Records, Multiple Informants, Family Member           Med Note DELBERTA, [place] C   Dju Aug 31, 2021  1:11 PM)    Insulin  Pen Needle 31G X 8 MM MISC [id number]  Use to inject insulin  one  time daily [provider]  Active Pharmacy Records, Multiple Informants, Family Member           Med Note DELBERTA, [place] C   Dju Aug 31, 2021  1:11 PM)    levothyroxine  (SYNTHROID ) 137 MCG tablet [id number]  Take 137 mcg by mouth daily before breakfast. [provider]  Active Pharmacy Records, Multiple Informants, Family Member  MOUNJARO 10 MG/0.5ML Pen [id number]  Inject 10 mg into the skin once a week. [provider]  Active Pharmacy Records, Multiple Informants, Family Member  polyethylene glycol (MIRALAX  / GLYCOLAX ) 17 g packet [id number]  Take 17 g by mouth daily as needed for mild constipation. Maree Adron BIRCH, DO  Active Pharmacy Records, Multiple Informants, Family Member  pravastatin  (PRAVACHOL ) 40 MG tablet [id number]  TAKE ONE TABLET BY MOUTH AT BEDTIME Cook, Jayce G, DO  Active Pharmacy Records, Multiple Informants, Family Member  pregabalin  (LYRICA ) 100 MG capsule [id number]  Take 100 mg by mouth 3 (three) times daily. [provider]  Active Pharmacy Records, Multiple Informants, Family Member  traMADol  (ULTRAM ) 50 MG tablet [id number]  Take 1-1.5 tablets (50-75 mg total) by mouth every 8 (eight) hours as needed for moderate pain (pain score 4-6) or  severe pain (pain score 7-10). Cook, Jayce G, DO  Active Pharmacy Records, Multiple Informants, Family Member  triamcinolone  cream (KENALOG ) 0.1 % [id number]  APPLY TO AFFECTED AREAS TWICE DAILY PRN Mauro Elveria BROCKS, NP  Active Pharmacy Records, Multiple Informants, Family Member            Goals Addressed             This Visit's Progress    VBCI Transitions of Care (TOC) Care Plan       Problems:  Recent Hospitalization for treatment of Angioedema Equipment/DME barrier Patient would like a power chair to aide in mobility and independence Pt saw primary care provider 10/12/23, unsure if power chair was discussed, will talk with daughter Pt is checking blood pressure few times per week, [facility]  home [facility] also monitoring per pt,  CBG fasting ranges 137-157, pt reports  doing well, no concerns reported  Goal:  Over the next 30 days, the patient will not experience hospital readmission  Interventions:  Transitions of Care: Doctor Visits  - discussed the importance of doctor visits Post discharge activity limitations prescribed by provider reviewed Medication reviewed, discussed discontinued Benazepril  Advised patient to contact her insurance provider to inquire about coverage for power chair Advised patient to discuss power chair with PCP  Reinforced the importance of patient checking her BP daily, recording and taking to provider visits Reinforced signs/ symptoms angioedema, importance of seeking emergency care  Patient Self Care Activities:  Attend all scheduled provider appointments Call provider office for new concerns or questions  Notify RN Care Manager of [facility] call rescheduling needs Participate in Transition of Care Program/Attend TOC scheduled calls Take medications as prescribed   Monitor BP, record and take to provider appointments Please discuss the need for a power chair with your doctor  Plan:  Telephone follow up appointment with care management team member scheduled for:  10/28/23 @ 115 pm        Recommendation:   PCP Follow-up  Follow Up Plan:   Telephone follow-up 10/28/23 @ 115 pm  Mliss Creed [facility], BSN RN Care Manager/ Transition of Care [place]/ [facility] Population Health [phone]

## 2023-10-22 DIAGNOSIS — G8929 Other chronic pain: Secondary | ICD-10-CM | POA: Diagnosis not present

## 2023-10-22 DIAGNOSIS — Z7951 Long term (current) use of inhaled steroids: Secondary | ICD-10-CM | POA: Diagnosis not present

## 2023-10-22 DIAGNOSIS — I4892 Unspecified atrial flutter: Secondary | ICD-10-CM | POA: Diagnosis not present

## 2023-10-22 DIAGNOSIS — Z853 Personal history of malignant neoplasm of breast: Secondary | ICD-10-CM | POA: Diagnosis not present

## 2023-10-22 DIAGNOSIS — I493 Ventricular premature depolarization: Secondary | ICD-10-CM | POA: Diagnosis not present

## 2023-10-22 DIAGNOSIS — Z6841 Body Mass Index (BMI) 40.0 and over, adult: Secondary | ICD-10-CM | POA: Diagnosis not present

## 2023-10-22 DIAGNOSIS — Z7985 Long-term (current) use of injectable non-insulin antidiabetic drugs: Secondary | ICD-10-CM | POA: Diagnosis not present

## 2023-10-22 DIAGNOSIS — Z794 Long term (current) use of insulin: Secondary | ICD-10-CM | POA: Diagnosis not present

## 2023-10-22 DIAGNOSIS — J45909 Unspecified asthma, uncomplicated: Secondary | ICD-10-CM | POA: Diagnosis not present

## 2023-10-22 DIAGNOSIS — T783XXD Angioneurotic edema, subsequent encounter: Secondary | ICD-10-CM | POA: Diagnosis not present

## 2023-10-22 DIAGNOSIS — Z7952 Long term (current) use of systemic steroids: Secondary | ICD-10-CM | POA: Diagnosis not present

## 2023-10-22 DIAGNOSIS — I10 Essential (primary) hypertension: Secondary | ICD-10-CM | POA: Diagnosis not present

## 2023-10-22 DIAGNOSIS — Z7901 Long term (current) use of anticoagulants: Secondary | ICD-10-CM | POA: Diagnosis not present

## 2023-10-22 DIAGNOSIS — E119 Type 2 diabetes mellitus without complications: Secondary | ICD-10-CM | POA: Diagnosis not present

## 2023-10-22 DIAGNOSIS — Z96652 Presence of left artificial knee joint: Secondary | ICD-10-CM | POA: Diagnosis not present

## 2023-10-22 DIAGNOSIS — Z9181 History of falling: Secondary | ICD-10-CM | POA: Diagnosis not present

## 2023-10-22 DIAGNOSIS — G4733 Obstructive sleep apnea (adult) (pediatric): Secondary | ICD-10-CM | POA: Diagnosis not present

## 2023-10-22 DIAGNOSIS — E785 Hyperlipidemia, unspecified: Secondary | ICD-10-CM | POA: Diagnosis not present

## 2023-10-22 DIAGNOSIS — E890 Postprocedural hypothyroidism: Secondary | ICD-10-CM | POA: Diagnosis not present

## 2023-10-22 DIAGNOSIS — E66813 Obesity, class 3: Secondary | ICD-10-CM | POA: Diagnosis not present

## 2023-10-26 DIAGNOSIS — E890 Postprocedural hypothyroidism: Secondary | ICD-10-CM | POA: Diagnosis not present

## 2023-10-26 DIAGNOSIS — E119 Type 2 diabetes mellitus without complications: Secondary | ICD-10-CM | POA: Diagnosis not present

## 2023-10-26 DIAGNOSIS — Z6841 Body Mass Index (BMI) 40.0 and over, adult: Secondary | ICD-10-CM | POA: Diagnosis not present

## 2023-10-26 DIAGNOSIS — Z7985 Long-term (current) use of injectable non-insulin antidiabetic drugs: Secondary | ICD-10-CM | POA: Diagnosis not present

## 2023-10-26 DIAGNOSIS — E66813 Obesity, class 3: Secondary | ICD-10-CM | POA: Diagnosis not present

## 2023-10-26 DIAGNOSIS — G8929 Other chronic pain: Secondary | ICD-10-CM | POA: Diagnosis not present

## 2023-10-26 DIAGNOSIS — G4733 Obstructive sleep apnea (adult) (pediatric): Secondary | ICD-10-CM | POA: Diagnosis not present

## 2023-10-26 DIAGNOSIS — Z853 Personal history of malignant neoplasm of breast: Secondary | ICD-10-CM | POA: Diagnosis not present

## 2023-10-26 DIAGNOSIS — Z7951 Long term (current) use of inhaled steroids: Secondary | ICD-10-CM | POA: Diagnosis not present

## 2023-10-26 DIAGNOSIS — E785 Hyperlipidemia, unspecified: Secondary | ICD-10-CM | POA: Diagnosis not present

## 2023-10-26 DIAGNOSIS — Z794 Long term (current) use of insulin: Secondary | ICD-10-CM | POA: Diagnosis not present

## 2023-10-26 DIAGNOSIS — I10 Essential (primary) hypertension: Secondary | ICD-10-CM | POA: Diagnosis not present

## 2023-10-26 DIAGNOSIS — Z7901 Long term (current) use of anticoagulants: Secondary | ICD-10-CM | POA: Diagnosis not present

## 2023-10-26 DIAGNOSIS — J45909 Unspecified asthma, uncomplicated: Secondary | ICD-10-CM | POA: Diagnosis not present

## 2023-10-26 DIAGNOSIS — T783XXD Angioneurotic edema, subsequent encounter: Secondary | ICD-10-CM | POA: Diagnosis not present

## 2023-10-26 DIAGNOSIS — Z96652 Presence of left artificial knee joint: Secondary | ICD-10-CM | POA: Diagnosis not present

## 2023-10-26 DIAGNOSIS — I493 Ventricular premature depolarization: Secondary | ICD-10-CM | POA: Diagnosis not present

## 2023-10-26 DIAGNOSIS — Z9181 History of falling: Secondary | ICD-10-CM | POA: Diagnosis not present

## 2023-10-26 DIAGNOSIS — Z7952 Long term (current) use of systemic steroids: Secondary | ICD-10-CM | POA: Diagnosis not present

## 2023-10-26 DIAGNOSIS — I4892 Unspecified atrial flutter: Secondary | ICD-10-CM | POA: Diagnosis not present

## 2023-10-28 ENCOUNTER — Other Ambulatory Visit: Payer: Self-pay | Admitting: *Deleted

## 2023-10-28 ENCOUNTER — Telehealth: Payer: Self-pay | Admitting: *Deleted

## 2023-10-28 DIAGNOSIS — Z9181 History of falling: Secondary | ICD-10-CM | POA: Diagnosis not present

## 2023-10-28 DIAGNOSIS — T783XXD Angioneurotic edema, subsequent encounter: Secondary | ICD-10-CM | POA: Diagnosis not present

## 2023-10-28 DIAGNOSIS — Z7952 Long term (current) use of systemic steroids: Secondary | ICD-10-CM | POA: Diagnosis not present

## 2023-10-28 DIAGNOSIS — G4733 Obstructive sleep apnea (adult) (pediatric): Secondary | ICD-10-CM | POA: Diagnosis not present

## 2023-10-28 DIAGNOSIS — G8929 Other chronic pain: Secondary | ICD-10-CM | POA: Diagnosis not present

## 2023-10-28 DIAGNOSIS — Z7985 Long-term (current) use of injectable non-insulin antidiabetic drugs: Secondary | ICD-10-CM | POA: Diagnosis not present

## 2023-10-28 DIAGNOSIS — Z853 Personal history of malignant neoplasm of breast: Secondary | ICD-10-CM | POA: Diagnosis not present

## 2023-10-28 DIAGNOSIS — Z794 Long term (current) use of insulin: Secondary | ICD-10-CM | POA: Diagnosis not present

## 2023-10-28 DIAGNOSIS — I4892 Unspecified atrial flutter: Secondary | ICD-10-CM | POA: Diagnosis not present

## 2023-10-28 DIAGNOSIS — Z7901 Long term (current) use of anticoagulants: Secondary | ICD-10-CM | POA: Diagnosis not present

## 2023-10-28 DIAGNOSIS — E890 Postprocedural hypothyroidism: Secondary | ICD-10-CM | POA: Diagnosis not present

## 2023-10-28 DIAGNOSIS — E66813 Obesity, class 3: Secondary | ICD-10-CM | POA: Diagnosis not present

## 2023-10-28 DIAGNOSIS — I10 Essential (primary) hypertension: Secondary | ICD-10-CM | POA: Diagnosis not present

## 2023-10-28 DIAGNOSIS — Z7951 Long term (current) use of inhaled steroids: Secondary | ICD-10-CM | POA: Diagnosis not present

## 2023-10-28 DIAGNOSIS — J45909 Unspecified asthma, uncomplicated: Secondary | ICD-10-CM | POA: Diagnosis not present

## 2023-10-28 DIAGNOSIS — I493 Ventricular premature depolarization: Secondary | ICD-10-CM | POA: Diagnosis not present

## 2023-10-28 DIAGNOSIS — E785 Hyperlipidemia, unspecified: Secondary | ICD-10-CM | POA: Diagnosis not present

## 2023-10-28 DIAGNOSIS — Z96652 Presence of left artificial knee joint: Secondary | ICD-10-CM | POA: Diagnosis not present

## 2023-10-28 DIAGNOSIS — E119 Type 2 diabetes mellitus without complications: Secondary | ICD-10-CM | POA: Diagnosis not present

## 2023-10-28 DIAGNOSIS — Z6841 Body Mass Index (BMI) 40.0 and over, adult: Secondary | ICD-10-CM | POA: Diagnosis not present

## 2023-10-28 NOTE — Telephone Encounter (Signed)
 Copied from CRM #[id number]. Topic: Clinical - Home Health Verbal Orders >> Oct 28, 2023 12:55 PM Avram MATSU wrote: Caller/Agency: Rosina Rushing Number: [phone] Service Requested: Occupational Therapy Frequency: 2*1 for an additional 2 weeks Any new concerns about the patient? No

## 2023-10-28 NOTE — Patient Instructions (Signed)
 Visit Information  Thank you for taking time to visit with me today. Please don't hesitate to contact me if I can be of assistance to you before our next scheduled telephone appointment.  Our next appointment is by telephone on 11/05/23 @ 915 am  Following is a copy of your care plan:   Goals Addressed             This Visit's Progress    VBCI Transitions of Care (TOC) Care Plan       Problems:  Recent Hospitalization for treatment of Angioedema Equipment/DME barrier Patient would like a power chair to aide in mobility and independence Pt saw primary care provider 10/12/23, unsure if power chair was discussed, will talk with daughter Pt is checking blood pressure few times per week, [facility] also monitoring per pt,  CBG random reading today 171, BP 129/70 pt reports  doing well, no concerns reported  Goal:  Over the next 30 days, the patient will not experience hospital readmission  Interventions:  Transitions of Care: Doctor Visits  - discussed the importance of doctor visits Post discharge activity limitations prescribed by provider reviewed Medication reviewed, discussed discontinued Benazepril  Advised patient to contact her insurance provider to inquire about coverage for power chair Advised patient to discuss power chair with PCP  Reviewed the importance of patient checking her BP daily, recording and taking to provider visits Reviewed signs/ symptoms angioedema, importance of seeking emergency care  Patient Self Care Activities:  Attend all scheduled provider appointments Call provider office for new concerns or questions  Notify RN Care Manager of TOC call rescheduling needs Participate in Transition of Care Program/Attend TOC scheduled calls Take medications as prescribed   Monitor BP, record and take to provider appointments Please discuss the need for a power chair with your doctor Continue checking blood pressure, blood sugar and keeping a log  Plan:   Telephone follow up appointment with care management team member scheduled for:  11/05/23 @ 915 am        Patient verbalizes understanding of instructions and care plan provided today and agrees to view in MyChart. Active MyChart status and patient understanding of how to access instructions and care plan via MyChart confirmed with patient.     Telephone follow up appointment with care management team member scheduled for: 11/05/23 @ 915 am  Please call the care guide team at [phone] if you need to cancel or reschedule your appointment.   Please call the Suicide and Crisis Lifeline: 988 call the [place]  National Suicide Prevention Lifeline: (817) 078-8943 or TTY: (878)650-1384 TTY (205)489-4112) to talk to a trained counselor call 1-800-273-TALK (toll free, 24 hour hotline) go to [facility] [address] [place] [id number]) call the [facility] Crisis Line: [phone] call 911 if you are experiencing a Mental Health or Behavioral Health Crisis or need someone to talk to.  Mliss Creed [facility], BSN RN Care Manager/ Transition of Care [place]/ [facility] [phone]

## 2023-10-28 NOTE — Patient Outreach (Signed)
 Transition of Care week 4  Visit Note  10/28/2023  Name: [name] MRN: [id number]          DOB: 11-Mar-1955  Situation: Patient enrolled in [facility] 30-day program. Visit completed with patient by telephone.   Background:  Discharge Date and Diagnosis: 09/30/23, angioedema   Past Medical History:  Diagnosis Date   Asthma    prn neb. and inhaler   Breast cancer (HCC) 01/13/2013   right   Dehydration 04/13/2013   GERD (gastroesophageal reflux disease)    Graves' disease    Hyperlipidemia    Hypertension    on multiple meds., has been on med. > 14 yr.   Non-insulin  dependent type 2 diabetes mellitus (HCC)    Obesity    Ophthalmic manifestation of Graves disease    Osteoarthritis 01/13/2001   left knee   Radiation 10/11/13-11/29/13   Right Breast   Sleep apnea 04/14/2014   mod  , wears cpap 10 , ramps from 4 , nasal mask   Wears glasses     Assessment: Patient Reported Symptoms: Cognitive Cognitive Status: No symptoms reported, Able to follow simple commands, Alert and oriented to person, place, and time, Normal speech and language skills      Neurological Neurological Review of Symptoms: No symptoms reported    HEENT HEENT Symptoms Reported: No symptoms reported      Cardiovascular Cardiovascular Symptoms Reported: No symptoms reported Does patient have uncontrolled Hypertension?: No Cardiovascular Comment: BP today 129/70  Respiratory Respiratory Symptoms Reported: No symptoms reported    Endocrine Endocrine Symptoms Reported: No symptoms reported Is patient diabetic?: Yes Is patient checking blood sugars at home?: Yes List most recent blood sugar readings, include date and time of day: Dexcom G7  RBS roday 171 Endocrine Self-Management Outcome: 4 (good)  Gastrointestinal Gastrointestinal Symptoms Reported: No symptoms reported      Genitourinary Genitourinary Symptoms Reported: No symptoms reported    Integumentary Integumentary Symptoms Reported: No  symptoms reported    Musculoskeletal Musculoskelatal Symptoms Reviewed: Limited mobility Musculoskeletal Management Strategies: Adequate rest, Routine screening, Medical device Musculoskeletal Self-Management Outcome: 3 (uncertain) Musculoskeletal Comment: [facility] continues working with pt,  OT/PT saw pt today,  reviewed safety precautions      Psychosocial Psychosocial Symptoms Reported: No symptoms reported         There were no vitals filed for this visit.  Medications Reviewed Today     Reviewed by Aura Mliss LABOR, RN (Registered Nurse) on 10/28/23 at 1328  Med List Status: <None>   Medication Order Taking? Sig Documenting Provider Last Dose Status Informant  acetaminophen  (TYLENOL ) 325 MG tablet [id number]  Take 650 mg by mouth every 6 (six) hours as needed for mild pain (pain score 1-3) or moderate pain (pain score 4-6).  Patient taking differently: Take 2,000 mg by mouth 3 (three) times daily.   [provider]  Active Pharmacy Records, Multiple Informants, Family Member  albuterol  (PROVENTIL ) (2.5 MG/3ML) 0.083% nebulizer solution [id number]  USE 1 VIAL IN NEBULIZER EVERY 6 HOURS AS NEEDED FOR WHEEZING OR SHORTNESS OF BREATH. Cook, Jayce G, DO  Active Pharmacy Records, Multiple Informants, Family Member  amLODipine  (NORVASC ) 10 MG tablet [id number]  TAKE (1) TABLET BY MOUTH ONCE DAILY. Cook, Jayce G, DO  Active Pharmacy Records, Multiple Informants, Family Member  apixaban  (ELIQUIS ) 5 MG TABS tablet [id number]  Take 1 tablet (5 mg total) by mouth 2 (two) times daily. Cook, Jayce G, DO  Active Pharmacy Records, Multiple Informants, Family Member  carvedilol  (COREG ) 12.5 MG tablet [id number]  TAKE 1 TABLET BY MOUTH 2 TIMES DAILY WITH A MEAL. Cook, Jayce G, DO  Active Pharmacy Records, Multiple Informants, Family Member  cholecalciferol  (VITAMIN D3) 25 MCG (1000 UNIT) tablet [id number]  Take 1,000 Units by mouth daily. [provider]  Active Pharmacy Records,  Multiple Informants, Family Member  Continuous Glucose Sensor (DEXCOM G7 SENSOR) [place] [id number]  USE TO MONITOR BLOOD SUGAR. CHANGE EVERY 10 DAYS. [provider]  Active Pharmacy Records, Multiple Informants, Family Member  dapagliflozin  propanediol (FARXIGA ) 5 MG TABS tablet [id number]  Take 1 tablet (5 mg total) by mouth daily. Cook, Jayce G, DO  Active Pharmacy Records, Multiple Informants, Family Member           Med Note STEFFI, ADELITA   Mon Sep 28, 2023 10:46 AM) New medication started on 09/24/2023   diclofenac  Sodium (VOLTAREN  ARTHRITIS PAIN) 1 % GEL [id number]  Apply 2 g topically 4 (four) times daily as needed (arthritis pain). [provider]  Active Pharmacy Records, Multiple Informants, Family Member  diphenhydrAMINE  (BENADRYL ) 25 mg capsule [id number]  Take 25 mg by mouth daily at 12 noon.  Patient not taking: Reported on 10/05/2023   [provider]  Active Pharmacy Records, Multiple Informants, Family Member  fluticasone  (FLOVENT  HFA) 44 MCG/ACT inhaler [id number]  Inhale 2 puffs into the lungs 2 (two) times daily.  Patient taking differently: Inhale 2 puffs into the lungs daily as needed (shortness of breath).   Alphonsa Elsie RAMAN, MD  Active Pharmacy Records, Multiple Informants, Family Member  glucose blood [facility] ULTRA) test strip [id number]  USE TO TEST BLOOD SUGAR 4 TIMES DAILY AS DIRECTED. Cook, Jayce G, DO  Active Pharmacy Records, Multiple Informants, Family Member  HUMALOG MIX 75/25 KWIKPEN (75-25) 100 UNIT/ML Kary [id number]  Inject 12 Units into the skin in the morning and at bedtime. [provider]  Active Pharmacy Records, Multiple Informants, Family Member           Med Note DELBERTA, [place] C   Dju Aug 31, 2021  1:11 PM)    Insulin  Pen Needle 31G X 8 MM MISC [id number]  Use to inject insulin  one time daily [provider]  Active Pharmacy Records, Multiple Informants, Family Member           Med Note DELBERTA, [place] C    Dju Aug 31, 2021  1:11 PM)    levothyroxine  (SYNTHROID ) 137 MCG tablet [id number]  Take 137 mcg by mouth daily before breakfast. [provider]  Active Pharmacy Records, Multiple Informants, Family Member  MOUNJARO 10 MG/0.5ML Pen [id number]  Inject 10 mg into the skin once a week. [provider]  Active Pharmacy Records, Multiple Informants, Family Member  polyethylene glycol (MIRALAX  / GLYCOLAX ) 17 g packet [id number]  Take 17 g by mouth daily as needed for mild constipation. Maree Adron BIRCH, DO  Active Pharmacy Records, Multiple Informants, Family Member  pravastatin  (PRAVACHOL ) 40 MG tablet [id number]  TAKE ONE TABLET BY MOUTH AT BEDTIME Cook, Jayce G, DO  Active Pharmacy Records, Multiple Informants, Family Member  pregabalin  (LYRICA ) 100 MG capsule [id number]  Take 100 mg by mouth 3 (three) times daily. [provider]  Active Pharmacy Records, Multiple Informants, Family Member  traMADol  (ULTRAM ) 50 MG tablet [id number]  Take 1-1.5 tablets (50-75 mg total) by mouth every 8 (eight) hours as needed for moderate pain (pain score 4-6) or severe pain (pain score 7-10). Cook, Jayce G, DO  Active Pharmacy Records,  Multiple Informants, Family Member  triamcinolone  cream (KENALOG ) 0.1 % [id number]  APPLY TO AFFECTED AREAS TWICE DAILY PRN Mauro Elveria BROCKS, NP  Active Pharmacy Records, Multiple Informants, Family Member            Goals Addressed             This Visit's Progress    VBCI Transitions of Care (TOC) Care Plan       Problems:  Recent Hospitalization for treatment of Angioedema Equipment/DME barrier Patient would like a power chair to aide in mobility and independence Pt saw primary care provider 10/12/23, unsure if power chair was discussed, will talk with daughter Pt is checking blood pressure few times per week, [facility] home [facility] also monitoring per pt,  CBG random reading today 171, BP 129/70 pt reports  doing well, no concerns reported  Goal:  Over  the next 30 days, the patient will not experience hospital readmission  Interventions:  Transitions of Care: Doctor Visits  - discussed the importance of doctor visits Post discharge activity limitations prescribed by provider reviewed Medication reviewed, discussed discontinued Benazepril  Advised patient to contact her insurance provider to inquire about coverage for power chair Advised patient to discuss power chair with PCP  Reviewed the importance of patient checking her BP daily, recording and taking to provider visits Reviewed signs/ symptoms angioedema, importance of seeking emergency care  Patient Self Care Activities:  Attend all scheduled provider appointments Call provider office for new concerns or questions  Notify RN Care Manager of TOC call rescheduling needs Participate in Transition of Care Program/Attend TOC scheduled calls Take medications as prescribed   Monitor BP, record and take to provider appointments Please discuss the need for a power chair with your doctor Continue checking blood pressure, blood sugar and keeping a log  Plan:  Telephone follow up appointment with care management team member scheduled for:  11/05/23 @ 915 am         Recommendation:   PCP Follow-up  Follow Up Plan:   Telephone follow-up 11/05/23 @ 915 am  Mliss Creed [facility], BSN RN Care Manager/ Transition of Care [place]/ [facility] Population Health [phone]

## 2023-10-29 NOTE — Telephone Encounter (Signed)
 Left message to return call

## 2023-11-02 DIAGNOSIS — Z6841 Body Mass Index (BMI) 40.0 and over, adult: Secondary | ICD-10-CM | POA: Diagnosis not present

## 2023-11-02 DIAGNOSIS — E66813 Obesity, class 3: Secondary | ICD-10-CM | POA: Diagnosis not present

## 2023-11-02 DIAGNOSIS — I493 Ventricular premature depolarization: Secondary | ICD-10-CM | POA: Diagnosis not present

## 2023-11-02 DIAGNOSIS — G8929 Other chronic pain: Secondary | ICD-10-CM | POA: Diagnosis not present

## 2023-11-02 DIAGNOSIS — Z9181 History of falling: Secondary | ICD-10-CM | POA: Diagnosis not present

## 2023-11-02 DIAGNOSIS — Z853 Personal history of malignant neoplasm of breast: Secondary | ICD-10-CM | POA: Diagnosis not present

## 2023-11-02 DIAGNOSIS — Z7951 Long term (current) use of inhaled steroids: Secondary | ICD-10-CM | POA: Diagnosis not present

## 2023-11-02 DIAGNOSIS — E890 Postprocedural hypothyroidism: Secondary | ICD-10-CM | POA: Diagnosis not present

## 2023-11-02 DIAGNOSIS — T783XXD Angioneurotic edema, subsequent encounter: Secondary | ICD-10-CM | POA: Diagnosis not present

## 2023-11-02 DIAGNOSIS — Z96652 Presence of left artificial knee joint: Secondary | ICD-10-CM | POA: Diagnosis not present

## 2023-11-02 DIAGNOSIS — E119 Type 2 diabetes mellitus without complications: Secondary | ICD-10-CM | POA: Diagnosis not present

## 2023-11-02 DIAGNOSIS — I4892 Unspecified atrial flutter: Secondary | ICD-10-CM | POA: Diagnosis not present

## 2023-11-02 DIAGNOSIS — J45909 Unspecified asthma, uncomplicated: Secondary | ICD-10-CM | POA: Diagnosis not present

## 2023-11-02 DIAGNOSIS — E785 Hyperlipidemia, unspecified: Secondary | ICD-10-CM | POA: Diagnosis not present

## 2023-11-02 DIAGNOSIS — G4733 Obstructive sleep apnea (adult) (pediatric): Secondary | ICD-10-CM | POA: Diagnosis not present

## 2023-11-02 DIAGNOSIS — Z794 Long term (current) use of insulin: Secondary | ICD-10-CM | POA: Diagnosis not present

## 2023-11-02 DIAGNOSIS — Z7901 Long term (current) use of anticoagulants: Secondary | ICD-10-CM | POA: Diagnosis not present

## 2023-11-02 DIAGNOSIS — I10 Essential (primary) hypertension: Secondary | ICD-10-CM | POA: Diagnosis not present

## 2023-11-02 DIAGNOSIS — Z7985 Long-term (current) use of injectable non-insulin antidiabetic drugs: Secondary | ICD-10-CM | POA: Diagnosis not present

## 2023-11-02 DIAGNOSIS — Z7952 Long term (current) use of systemic steroids: Secondary | ICD-10-CM | POA: Diagnosis not present

## 2023-11-04 DIAGNOSIS — I493 Ventricular premature depolarization: Secondary | ICD-10-CM | POA: Diagnosis not present

## 2023-11-04 DIAGNOSIS — Z6841 Body Mass Index (BMI) 40.0 and over, adult: Secondary | ICD-10-CM | POA: Diagnosis not present

## 2023-11-04 DIAGNOSIS — Z96652 Presence of left artificial knee joint: Secondary | ICD-10-CM | POA: Diagnosis not present

## 2023-11-04 DIAGNOSIS — Z7951 Long term (current) use of inhaled steroids: Secondary | ICD-10-CM | POA: Diagnosis not present

## 2023-11-04 DIAGNOSIS — G4733 Obstructive sleep apnea (adult) (pediatric): Secondary | ICD-10-CM | POA: Diagnosis not present

## 2023-11-04 DIAGNOSIS — I10 Essential (primary) hypertension: Secondary | ICD-10-CM | POA: Diagnosis not present

## 2023-11-04 DIAGNOSIS — T783XXD Angioneurotic edema, subsequent encounter: Secondary | ICD-10-CM | POA: Diagnosis not present

## 2023-11-04 DIAGNOSIS — Z853 Personal history of malignant neoplasm of breast: Secondary | ICD-10-CM | POA: Diagnosis not present

## 2023-11-04 DIAGNOSIS — Z794 Long term (current) use of insulin: Secondary | ICD-10-CM | POA: Diagnosis not present

## 2023-11-04 DIAGNOSIS — Z7985 Long-term (current) use of injectable non-insulin antidiabetic drugs: Secondary | ICD-10-CM | POA: Diagnosis not present

## 2023-11-04 DIAGNOSIS — E785 Hyperlipidemia, unspecified: Secondary | ICD-10-CM | POA: Diagnosis not present

## 2023-11-04 DIAGNOSIS — E890 Postprocedural hypothyroidism: Secondary | ICD-10-CM | POA: Diagnosis not present

## 2023-11-04 DIAGNOSIS — E66813 Obesity, class 3: Secondary | ICD-10-CM | POA: Diagnosis not present

## 2023-11-04 DIAGNOSIS — J45909 Unspecified asthma, uncomplicated: Secondary | ICD-10-CM | POA: Diagnosis not present

## 2023-11-04 DIAGNOSIS — I4892 Unspecified atrial flutter: Secondary | ICD-10-CM | POA: Diagnosis not present

## 2023-11-04 DIAGNOSIS — Z7952 Long term (current) use of systemic steroids: Secondary | ICD-10-CM | POA: Diagnosis not present

## 2023-11-04 DIAGNOSIS — Z9181 History of falling: Secondary | ICD-10-CM | POA: Diagnosis not present

## 2023-11-04 DIAGNOSIS — G8929 Other chronic pain: Secondary | ICD-10-CM | POA: Diagnosis not present

## 2023-11-04 DIAGNOSIS — Z7901 Long term (current) use of anticoagulants: Secondary | ICD-10-CM | POA: Diagnosis not present

## 2023-11-04 DIAGNOSIS — E119 Type 2 diabetes mellitus without complications: Secondary | ICD-10-CM | POA: Diagnosis not present

## 2023-11-05 ENCOUNTER — Other Ambulatory Visit: Payer: Self-pay | Admitting: *Deleted

## 2023-11-05 NOTE — Patient Outreach (Signed)
 Transition of Care week 5  Visit Note  11/05/2023  Name: [name] MRN: [id number]          DOB: 1955/03/01  Situation: Patient enrolled in [facility] 30-day program. Visit completed with patient by telephone.   Background:  Discharge Date and Diagnosis: 09/30/23, angioedema   Past Medical History:  Diagnosis Date   Asthma    prn neb. and inhaler   Breast cancer (HCC) 01/13/2013   right   Dehydration 04/13/2013   GERD (gastroesophageal reflux disease)    Graves' disease    Hyperlipidemia    Hypertension    on multiple meds., has been on med. > 14 yr.   Non-insulin  dependent type 2 diabetes mellitus (HCC)    Obesity    Ophthalmic manifestation of Graves disease    Osteoarthritis 01/13/2001   left knee   Radiation 10/11/13-11/29/13   Right Breast   Sleep apnea 04/14/2014   mod  , wears cpap 10 , ramps from 4 , nasal mask   Wears glasses     Assessment: Patient Reported Symptoms: Cognitive Cognitive Status: No symptoms reported, Alert and oriented to person, place, and time, Normal speech and language skills, Able to follow simple commands      Neurological Neurological Review of Symptoms: No symptoms reported    HEENT HEENT Symptoms Reported: No symptoms reported      Cardiovascular Cardiovascular Symptoms Reported: No symptoms reported Does patient have uncontrolled Hypertension?: No Cardiovascular Management Strategies: Adequate rest, Medication therapy, Routine screening Cardiovascular Self-Management Outcome: 4 (good) Cardiovascular Comment: BP yesterday checked by OT  128/80  Respiratory Respiratory Symptoms Reported: No symptoms reported    Endocrine Endocrine Symptoms Reported: No symptoms reported Is patient diabetic?: Yes Is patient checking blood sugars at home?: Yes List most recent blood sugar readings, include date and time of day: Dexcom G7  RBS 171 Endocrine Self-Management Outcome: 4 (good)  Gastrointestinal Gastrointestinal Symptoms Reported:  No symptoms reported      Genitourinary Genitourinary Symptoms Reported: No symptoms reported    Integumentary Integumentary Symptoms Reported: No symptoms reported    Musculoskeletal Musculoskelatal Symptoms Reviewed: Limited mobility Additional Musculoskeletal Details: pt has not walked in 2.5 years Musculoskeletal Management Strategies: Adequate rest, Routine screening Musculoskeletal Self-Management Outcome: 3 (uncertain) Musculoskeletal Comment: Home health continues working with pt      Psychosocial Psychosocial Symptoms Reported: No symptoms reported         There were no vitals filed for this visit.  Medications Reviewed Today     Reviewed by Aura Mliss LABOR, RN (Registered Nurse) on 11/05/23 at [phone]  Med List Status: <None>   Medication Order Taking? Sig Documenting Provider Last Dose Status Informant  acetaminophen  (TYLENOL ) 325 MG tablet [id number]  Take 650 mg by mouth every 6 (six) hours as needed for mild pain (pain score 1-3) or moderate pain (pain score 4-6).  Patient taking differently: Take 2,000 mg by mouth 3 (three) times daily.   [provider]  Active Pharmacy Records, Multiple Informants, Family Member  albuterol  (PROVENTIL ) (2.5 MG/3ML) 0.083% nebulizer solution [id number]  USE 1 VIAL IN NEBULIZER EVERY 6 HOURS AS NEEDED FOR WHEEZING OR SHORTNESS OF BREATH. Cook, Jayce G, DO  Active Pharmacy Records, Multiple Informants, Family Member  amLODipine  (NORVASC ) 10 MG tablet [id number]  TAKE (1) TABLET BY MOUTH ONCE DAILY. Cook, Jayce G, DO  Active Pharmacy Records, Multiple Informants, Family Member  apixaban  (ELIQUIS ) 5 MG TABS tablet [id number]  Take 1 tablet (5 mg total) by mouth 2 (  two) times daily. Cook, Jayce G, DO  Active Pharmacy Records, Multiple Informants, Family Member  carvedilol  (COREG ) 12.5 MG tablet [id number]  TAKE 1 TABLET BY MOUTH 2 TIMES DAILY WITH A MEAL. Cook, Jayce G, DO  Active Pharmacy Records, Multiple Informants, Family Member   cholecalciferol  (VITAMIN D3) 25 MCG (1000 UNIT) tablet [id number]  Take 1,000 Units by mouth daily. [provider]  Active Pharmacy Records, Multiple Informants, Family Member  Continuous Glucose Sensor (DEXCOM G7 SENSOR) [place] [id number]  USE TO MONITOR BLOOD SUGAR. CHANGE EVERY 10 DAYS. [provider]  Active Pharmacy Records, Multiple Informants, Family Member  dapagliflozin  propanediol (FARXIGA ) 5 MG TABS tablet [id number]  Take 1 tablet (5 mg total) by mouth daily. Cook, Jayce G, DO  Active Pharmacy Records, Multiple Informants, Family Member           Med Note STEFFI, ADELITA   Mon Sep 28, 2023 10:46 AM) New medication started on 09/24/2023   diclofenac  Sodium (VOLTAREN  ARTHRITIS PAIN) 1 % GEL [id number]  Apply 2 g topically 4 (four) times daily as needed (arthritis pain). [provider]  Active Pharmacy Records, Multiple Informants, Family Member  diphenhydrAMINE  (BENADRYL ) 25 mg capsule [id number]  Take 25 mg by mouth daily at 12 noon.  Patient not taking: Reported on 10/05/2023   [provider]  Active Pharmacy Records, Multiple Informants, Family Member  fluticasone  (FLOVENT  HFA) 44 MCG/ACT inhaler [id number]  Inhale 2 puffs into the lungs 2 (two) times daily.  Patient taking differently: Inhale 2 puffs into the lungs daily as needed (shortness of breath).   Alphonsa Elsie RAMAN, MD  Active Pharmacy Records, Multiple Informants, Family Member  glucose blood [facility] ULTRA) test strip [id number]  USE TO TEST BLOOD SUGAR 4 TIMES DAILY AS DIRECTED. Cook, Jayce G, DO  Active Pharmacy Records, Multiple Informants, Family Member  HUMALOG MIX 75/25 KWIKPEN (75-25) 100 UNIT/ML Kary [id number]  Inject 12 Units into the skin in the morning and at bedtime. [provider]  Active Pharmacy Records, Multiple Informants, Family Member           Med Note DELBERTA, [place] C   Dju Aug 31, 2021  1:11 PM)    Insulin  Pen Needle 31G X 8 MM MISC [id number]  Use to  inject insulin  one time daily [provider]  Active Pharmacy Records, Multiple Informants, Family Member           Med Note DELBERTA, [place] C   Sat Aug 31, 2021  1:11 PM)    levothyroxine  (SYNTHROID ) 137 MCG tablet [id number]  Take 137 mcg by mouth daily before breakfast. [provider]  Active Pharmacy Records, Multiple Informants, Family Member  MOUNJARO 10 MG/0.5ML Pen [id number]  Inject 10 mg into the skin once a week. [provider]  Active Pharmacy Records, Multiple Informants, Family Member  polyethylene glycol (MIRALAX  / GLYCOLAX ) 17 g packet [id number]  Take 17 g by mouth daily as needed for mild constipation. Maree Adron BIRCH, DO  Active Pharmacy Records, Multiple Informants, Family Member  pravastatin  (PRAVACHOL ) 40 MG tablet [id number]  TAKE ONE TABLET BY MOUTH AT BEDTIME Cook, Jayce G, DO  Active Pharmacy Records, Multiple Informants, Family Member  pregabalin  (LYRICA ) 100 MG capsule [id number]  Take 100 mg by mouth 3 (three) times daily. [provider]  Active Pharmacy Records, Multiple Informants, Family Member  traMADol  (ULTRAM ) 50 MG tablet [id number]  Take 1-1.5 tablets (50-75 mg total) by mouth every 8 (eight) hours as needed for moderate pain (pain  score 4-6) or severe pain (pain score 7-10). Cook, Jayce G, DO  Active Pharmacy Records, Multiple Informants, Family Member  triamcinolone  cream (KENALOG ) 0.1 % [id number]  APPLY TO AFFECTED AREAS TWICE DAILY PRN Mauro Elveria BROCKS, NP  Active Pharmacy Records, Multiple Informants, Family Member            Goals Addressed             This Visit's Progress    COMPLETED: VBCI Transitions of Care (TOC) Care Plan       Problems:  Recent Hospitalization for treatment of Angioedema Equipment/DME barrier Patient would like a power chair to aide in mobility and independence Pt saw primary care provider 10/12/23, unsure if power chair was discussed, will talk with daughter 11/05/23-Pt is checking blood  pressure few times per week, [facility] also monitoring per pt,  CBG random reading today 152, BP 128/80 pt reports  doing well, no concerns reported, pt reports she and her daughter still plan to discuss with primary care provider about getting a power wheelchair/ qualifications  Goal:  Over the next 30 days, the patient will not experience hospital readmission  Interventions:  Transitions of Care: Doctor Visits  - discussed the importance of doctor visits Post discharge activity limitations prescribed by provider reviewed Medication reviewed, discussed discontinued Benazepril  Advised patient to contact her insurance provider to inquire about coverage for power chair Advised patient to discuss power chair with PCP  Reviewed the importance of patient checking her BP daily, recording and taking to provider visits Reinforced signs/ symptoms angioedema, importance of seeking emergency care Reviewed plan of care with pt including TOC case closure   Patient Self Care Activities:  Attend all scheduled provider appointments Call provider office for new concerns or questions  Notify RN Care Manager of TOC call rescheduling needs Participate in Transition of Care Program/Attend TOC scheduled calls Take medications as prescribed   Monitor BP, record and take to provider appointments Please discuss the need for a power chair with your doctor Continue checking blood pressure, blood sugar and keeping a log TOC case closure  Plan:  No further follow up required: case closure        Recommendation:   PCP Follow-up  Follow Up Plan:   Closing From:  Transitions of Care Program  Mliss Creed [facility], BSN RN Care Manager/ Transition of Care [place]/ [facility] Population [facility] [phone]

## 2023-11-05 NOTE — Patient Instructions (Signed)
 Visit Information  Thank you for taking time to visit with me today. Please don't hesitate to contact me if I can be of assistance to you before our next scheduled telephone appointment.  Our next appointment is no further scheduled appointments.    Following is a copy of your care plan:   Goals Addressed             This Visit's Progress    COMPLETED: VBCI Transitions of Care (TOC) Care Plan       Problems:  Recent Hospitalization for treatment of Angioedema Equipment/DME barrier Patient would like a power chair to aide in mobility and independence Pt saw primary care provider 10/12/23, unsure if power chair was discussed, will talk with daughter 11/05/23-Pt is checking blood pressure few times per week, [facility] also monitoring per pt,  CBG random reading today 152, BP 128/80 pt reports  doing well, no concerns reported, pt reports she and her daughter still plan to discuss with primary care provider about getting a power wheelchair/ qualifications  Goal:  Over the next 30 days, the patient will not experience hospital readmission  Interventions:  Transitions of Care: Doctor Visits  - discussed the importance of doctor visits Post discharge activity limitations prescribed by provider reviewed Medication reviewed, discussed discontinued Benazepril  Advised patient to contact her insurance provider to inquire about coverage for power chair Advised patient to discuss power chair with PCP  Reviewed the importance of patient checking her BP daily, recording and taking to provider visits Reinforced signs/ symptoms angioedema, importance of seeking emergency care Reviewed plan of care with pt including TOC case closure   Patient Self Care Activities:  Attend all scheduled provider appointments Call provider office for new concerns or questions  Notify RN Care Manager of TOC call rescheduling needs Participate in Transition of Care Program/Attend TOC scheduled calls Take  medications as prescribed   Monitor BP, record and take to provider appointments Please discuss the need for a power chair with your doctor Continue checking blood pressure, blood sugar and keeping a log TOC case closure  Plan:  No further follow up required: case closure        Care plan and visit instructions communicated with the patient verbally today. Patient agrees to receive a copy in MyChart. Active MyChart status and patient understanding of how to access instructions and care plan via MyChart confirmed with patient.     No further follow up required: case closure  Please call the care guide team at [phone] if you need to cancel or reschedule your appointment.   Please call the Suicide and Crisis Lifeline: 988 call the [place]  National Suicide Prevention Lifeline: 586-736-6752 or TTY: 930-828-0697 TTY 770-330-1396) to talk to a trained counselor call 1-800-273-TALK (toll free, 24 hour hotline) go to [facility] [address] [place] [phone]) call the [facility] Crisis Line: 346-765-9959 call 911 if you are experiencing a Mental Health or Behavioral Health Crisis or need someone to talk to.  Mliss Creed [facility], BSN RN Care Manager/ Transition of Care [place]/ [facility] [phone]

## 2023-11-06 DIAGNOSIS — E66813 Obesity, class 3: Secondary | ICD-10-CM | POA: Diagnosis not present

## 2023-11-06 DIAGNOSIS — E119 Type 2 diabetes mellitus without complications: Secondary | ICD-10-CM | POA: Diagnosis not present

## 2023-11-06 DIAGNOSIS — I10 Essential (primary) hypertension: Secondary | ICD-10-CM | POA: Diagnosis not present

## 2023-11-06 DIAGNOSIS — I493 Ventricular premature depolarization: Secondary | ICD-10-CM | POA: Diagnosis not present

## 2023-11-06 DIAGNOSIS — Z853 Personal history of malignant neoplasm of breast: Secondary | ICD-10-CM | POA: Diagnosis not present

## 2023-11-06 DIAGNOSIS — Z7901 Long term (current) use of anticoagulants: Secondary | ICD-10-CM | POA: Diagnosis not present

## 2023-11-06 DIAGNOSIS — J45909 Unspecified asthma, uncomplicated: Secondary | ICD-10-CM | POA: Diagnosis not present

## 2023-11-06 DIAGNOSIS — Z9181 History of falling: Secondary | ICD-10-CM | POA: Diagnosis not present

## 2023-11-06 DIAGNOSIS — Z96652 Presence of left artificial knee joint: Secondary | ICD-10-CM | POA: Diagnosis not present

## 2023-11-06 DIAGNOSIS — E890 Postprocedural hypothyroidism: Secondary | ICD-10-CM | POA: Diagnosis not present

## 2023-11-06 DIAGNOSIS — Z7951 Long term (current) use of inhaled steroids: Secondary | ICD-10-CM | POA: Diagnosis not present

## 2023-11-06 DIAGNOSIS — Z6841 Body Mass Index (BMI) 40.0 and over, adult: Secondary | ICD-10-CM | POA: Diagnosis not present

## 2023-11-06 DIAGNOSIS — Z7985 Long-term (current) use of injectable non-insulin antidiabetic drugs: Secondary | ICD-10-CM | POA: Diagnosis not present

## 2023-11-06 DIAGNOSIS — G4733 Obstructive sleep apnea (adult) (pediatric): Secondary | ICD-10-CM | POA: Diagnosis not present

## 2023-11-06 DIAGNOSIS — E785 Hyperlipidemia, unspecified: Secondary | ICD-10-CM | POA: Diagnosis not present

## 2023-11-06 DIAGNOSIS — I4892 Unspecified atrial flutter: Secondary | ICD-10-CM | POA: Diagnosis not present

## 2023-11-06 DIAGNOSIS — Z794 Long term (current) use of insulin: Secondary | ICD-10-CM | POA: Diagnosis not present

## 2023-11-06 DIAGNOSIS — Z7952 Long term (current) use of systemic steroids: Secondary | ICD-10-CM | POA: Diagnosis not present

## 2023-11-06 DIAGNOSIS — T783XXD Angioneurotic edema, subsequent encounter: Secondary | ICD-10-CM | POA: Diagnosis not present

## 2023-11-06 DIAGNOSIS — G8929 Other chronic pain: Secondary | ICD-10-CM | POA: Diagnosis not present

## 2023-11-06 NOTE — Telephone Encounter (Signed)
 Left message to return call -Home health

## 2023-11-09 DIAGNOSIS — I4892 Unspecified atrial flutter: Secondary | ICD-10-CM | POA: Diagnosis not present

## 2023-11-09 DIAGNOSIS — J45909 Unspecified asthma, uncomplicated: Secondary | ICD-10-CM | POA: Diagnosis not present

## 2023-11-09 DIAGNOSIS — I493 Ventricular premature depolarization: Secondary | ICD-10-CM | POA: Diagnosis not present

## 2023-11-09 DIAGNOSIS — E119 Type 2 diabetes mellitus without complications: Secondary | ICD-10-CM | POA: Diagnosis not present

## 2023-11-09 DIAGNOSIS — G8929 Other chronic pain: Secondary | ICD-10-CM | POA: Diagnosis not present

## 2023-11-09 DIAGNOSIS — E890 Postprocedural hypothyroidism: Secondary | ICD-10-CM | POA: Diagnosis not present

## 2023-11-09 DIAGNOSIS — Z853 Personal history of malignant neoplasm of breast: Secondary | ICD-10-CM | POA: Diagnosis not present

## 2023-11-09 DIAGNOSIS — Z7951 Long term (current) use of inhaled steroids: Secondary | ICD-10-CM | POA: Diagnosis not present

## 2023-11-09 DIAGNOSIS — G4733 Obstructive sleep apnea (adult) (pediatric): Secondary | ICD-10-CM | POA: Diagnosis not present

## 2023-11-09 DIAGNOSIS — Z9181 History of falling: Secondary | ICD-10-CM | POA: Diagnosis not present

## 2023-11-09 DIAGNOSIS — Z794 Long term (current) use of insulin: Secondary | ICD-10-CM | POA: Diagnosis not present

## 2023-11-09 DIAGNOSIS — E785 Hyperlipidemia, unspecified: Secondary | ICD-10-CM | POA: Diagnosis not present

## 2023-11-09 DIAGNOSIS — Z7901 Long term (current) use of anticoagulants: Secondary | ICD-10-CM | POA: Diagnosis not present

## 2023-11-09 DIAGNOSIS — I10 Essential (primary) hypertension: Secondary | ICD-10-CM | POA: Diagnosis not present

## 2023-11-09 DIAGNOSIS — Z7952 Long term (current) use of systemic steroids: Secondary | ICD-10-CM | POA: Diagnosis not present

## 2023-11-09 DIAGNOSIS — Z96652 Presence of left artificial knee joint: Secondary | ICD-10-CM | POA: Diagnosis not present

## 2023-11-09 DIAGNOSIS — E66813 Obesity, class 3: Secondary | ICD-10-CM | POA: Diagnosis not present

## 2023-11-09 DIAGNOSIS — T783XXD Angioneurotic edema, subsequent encounter: Secondary | ICD-10-CM | POA: Diagnosis not present

## 2023-11-09 DIAGNOSIS — Z7985 Long-term (current) use of injectable non-insulin antidiabetic drugs: Secondary | ICD-10-CM | POA: Diagnosis not present

## 2023-11-09 DIAGNOSIS — Z6841 Body Mass Index (BMI) 40.0 and over, adult: Secondary | ICD-10-CM | POA: Diagnosis not present

## 2023-11-11 DIAGNOSIS — G8929 Other chronic pain: Secondary | ICD-10-CM | POA: Diagnosis not present

## 2023-11-11 DIAGNOSIS — Z853 Personal history of malignant neoplasm of breast: Secondary | ICD-10-CM | POA: Diagnosis not present

## 2023-11-11 DIAGNOSIS — E785 Hyperlipidemia, unspecified: Secondary | ICD-10-CM | POA: Diagnosis not present

## 2023-11-11 DIAGNOSIS — Z7951 Long term (current) use of inhaled steroids: Secondary | ICD-10-CM | POA: Diagnosis not present

## 2023-11-11 DIAGNOSIS — E119 Type 2 diabetes mellitus without complications: Secondary | ICD-10-CM | POA: Diagnosis not present

## 2023-11-11 DIAGNOSIS — E890 Postprocedural hypothyroidism: Secondary | ICD-10-CM | POA: Diagnosis not present

## 2023-11-11 DIAGNOSIS — Z7985 Long-term (current) use of injectable non-insulin antidiabetic drugs: Secondary | ICD-10-CM | POA: Diagnosis not present

## 2023-11-11 DIAGNOSIS — I4892 Unspecified atrial flutter: Secondary | ICD-10-CM | POA: Diagnosis not present

## 2023-11-11 DIAGNOSIS — Z7952 Long term (current) use of systemic steroids: Secondary | ICD-10-CM | POA: Diagnosis not present

## 2023-11-11 DIAGNOSIS — Z6841 Body Mass Index (BMI) 40.0 and over, adult: Secondary | ICD-10-CM | POA: Diagnosis not present

## 2023-11-11 DIAGNOSIS — Z7901 Long term (current) use of anticoagulants: Secondary | ICD-10-CM | POA: Diagnosis not present

## 2023-11-11 DIAGNOSIS — T783XXD Angioneurotic edema, subsequent encounter: Secondary | ICD-10-CM | POA: Diagnosis not present

## 2023-11-11 DIAGNOSIS — E66813 Obesity, class 3: Secondary | ICD-10-CM | POA: Diagnosis not present

## 2023-11-11 DIAGNOSIS — Z794 Long term (current) use of insulin: Secondary | ICD-10-CM | POA: Diagnosis not present

## 2023-11-11 DIAGNOSIS — J45909 Unspecified asthma, uncomplicated: Secondary | ICD-10-CM | POA: Diagnosis not present

## 2023-11-11 DIAGNOSIS — Z96652 Presence of left artificial knee joint: Secondary | ICD-10-CM | POA: Diagnosis not present

## 2023-11-11 DIAGNOSIS — I10 Essential (primary) hypertension: Secondary | ICD-10-CM | POA: Diagnosis not present

## 2023-11-11 DIAGNOSIS — Z9181 History of falling: Secondary | ICD-10-CM | POA: Diagnosis not present

## 2023-11-11 DIAGNOSIS — G4733 Obstructive sleep apnea (adult) (pediatric): Secondary | ICD-10-CM | POA: Diagnosis not present

## 2023-11-11 DIAGNOSIS — I493 Ventricular premature depolarization: Secondary | ICD-10-CM | POA: Diagnosis not present

## 2023-11-12 DIAGNOSIS — E890 Postprocedural hypothyroidism: Secondary | ICD-10-CM | POA: Diagnosis not present

## 2023-11-12 DIAGNOSIS — I493 Ventricular premature depolarization: Secondary | ICD-10-CM | POA: Diagnosis not present

## 2023-11-12 DIAGNOSIS — I10 Essential (primary) hypertension: Secondary | ICD-10-CM | POA: Diagnosis not present

## 2023-11-12 DIAGNOSIS — Z6841 Body Mass Index (BMI) 40.0 and over, adult: Secondary | ICD-10-CM | POA: Diagnosis not present

## 2023-11-12 DIAGNOSIS — Z7901 Long term (current) use of anticoagulants: Secondary | ICD-10-CM | POA: Diagnosis not present

## 2023-11-12 DIAGNOSIS — G4733 Obstructive sleep apnea (adult) (pediatric): Secondary | ICD-10-CM | POA: Diagnosis not present

## 2023-11-12 DIAGNOSIS — E66813 Obesity, class 3: Secondary | ICD-10-CM | POA: Diagnosis not present

## 2023-11-12 DIAGNOSIS — E119 Type 2 diabetes mellitus without complications: Secondary | ICD-10-CM | POA: Diagnosis not present

## 2023-11-12 DIAGNOSIS — Z7985 Long-term (current) use of injectable non-insulin antidiabetic drugs: Secondary | ICD-10-CM | POA: Diagnosis not present

## 2023-11-12 DIAGNOSIS — G8929 Other chronic pain: Secondary | ICD-10-CM | POA: Diagnosis not present

## 2023-11-12 DIAGNOSIS — I4892 Unspecified atrial flutter: Secondary | ICD-10-CM | POA: Diagnosis not present

## 2023-11-12 DIAGNOSIS — Z9181 History of falling: Secondary | ICD-10-CM | POA: Diagnosis not present

## 2023-11-12 DIAGNOSIS — T783XXD Angioneurotic edema, subsequent encounter: Secondary | ICD-10-CM | POA: Diagnosis not present

## 2023-11-12 DIAGNOSIS — E785 Hyperlipidemia, unspecified: Secondary | ICD-10-CM | POA: Diagnosis not present

## 2023-11-12 DIAGNOSIS — Z7952 Long term (current) use of systemic steroids: Secondary | ICD-10-CM | POA: Diagnosis not present

## 2023-11-12 DIAGNOSIS — Z7951 Long term (current) use of inhaled steroids: Secondary | ICD-10-CM | POA: Diagnosis not present

## 2023-11-12 DIAGNOSIS — J45909 Unspecified asthma, uncomplicated: Secondary | ICD-10-CM | POA: Diagnosis not present

## 2023-11-12 DIAGNOSIS — Z96652 Presence of left artificial knee joint: Secondary | ICD-10-CM | POA: Diagnosis not present

## 2023-11-12 DIAGNOSIS — Z853 Personal history of malignant neoplasm of breast: Secondary | ICD-10-CM | POA: Diagnosis not present

## 2023-11-12 DIAGNOSIS — Z794 Long term (current) use of insulin: Secondary | ICD-10-CM | POA: Diagnosis not present

## 2023-11-13 ENCOUNTER — Ambulatory Visit: Admitting: Surgical

## 2023-11-16 ENCOUNTER — Encounter: Payer: Self-pay | Admitting: Radiology

## 2023-11-16 DIAGNOSIS — I493 Ventricular premature depolarization: Secondary | ICD-10-CM | POA: Diagnosis not present

## 2023-11-16 DIAGNOSIS — T783XXD Angioneurotic edema, subsequent encounter: Secondary | ICD-10-CM | POA: Diagnosis not present

## 2023-11-16 DIAGNOSIS — I10 Essential (primary) hypertension: Secondary | ICD-10-CM | POA: Diagnosis not present

## 2023-11-16 DIAGNOSIS — Z7951 Long term (current) use of inhaled steroids: Secondary | ICD-10-CM | POA: Diagnosis not present

## 2023-11-16 DIAGNOSIS — Z96652 Presence of left artificial knee joint: Secondary | ICD-10-CM | POA: Diagnosis not present

## 2023-11-16 DIAGNOSIS — Z9181 History of falling: Secondary | ICD-10-CM | POA: Diagnosis not present

## 2023-11-16 DIAGNOSIS — Z794 Long term (current) use of insulin: Secondary | ICD-10-CM | POA: Diagnosis not present

## 2023-11-16 DIAGNOSIS — G8929 Other chronic pain: Secondary | ICD-10-CM | POA: Diagnosis not present

## 2023-11-16 DIAGNOSIS — I4892 Unspecified atrial flutter: Secondary | ICD-10-CM | POA: Diagnosis not present

## 2023-11-16 DIAGNOSIS — E785 Hyperlipidemia, unspecified: Secondary | ICD-10-CM | POA: Diagnosis not present

## 2023-11-16 DIAGNOSIS — E119 Type 2 diabetes mellitus without complications: Secondary | ICD-10-CM | POA: Diagnosis not present

## 2023-11-16 DIAGNOSIS — Z7985 Long-term (current) use of injectable non-insulin antidiabetic drugs: Secondary | ICD-10-CM | POA: Diagnosis not present

## 2023-11-16 DIAGNOSIS — G4733 Obstructive sleep apnea (adult) (pediatric): Secondary | ICD-10-CM | POA: Diagnosis not present

## 2023-11-16 DIAGNOSIS — Z853 Personal history of malignant neoplasm of breast: Secondary | ICD-10-CM | POA: Diagnosis not present

## 2023-11-16 DIAGNOSIS — E890 Postprocedural hypothyroidism: Secondary | ICD-10-CM | POA: Diagnosis not present

## 2023-11-16 DIAGNOSIS — Z7901 Long term (current) use of anticoagulants: Secondary | ICD-10-CM | POA: Diagnosis not present

## 2023-11-16 DIAGNOSIS — J45909 Unspecified asthma, uncomplicated: Secondary | ICD-10-CM | POA: Diagnosis not present

## 2023-11-16 DIAGNOSIS — Z6841 Body Mass Index (BMI) 40.0 and over, adult: Secondary | ICD-10-CM | POA: Diagnosis not present

## 2023-11-16 DIAGNOSIS — E66813 Obesity, class 3: Secondary | ICD-10-CM | POA: Diagnosis not present

## 2023-11-16 DIAGNOSIS — Z7952 Long term (current) use of systemic steroids: Secondary | ICD-10-CM | POA: Diagnosis not present

## 2023-11-17 DIAGNOSIS — T783XXD Angioneurotic edema, subsequent encounter: Secondary | ICD-10-CM | POA: Diagnosis not present

## 2023-11-17 DIAGNOSIS — G4733 Obstructive sleep apnea (adult) (pediatric): Secondary | ICD-10-CM | POA: Diagnosis not present

## 2023-11-17 DIAGNOSIS — Z6841 Body Mass Index (BMI) 40.0 and over, adult: Secondary | ICD-10-CM | POA: Diagnosis not present

## 2023-11-17 DIAGNOSIS — Z7985 Long-term (current) use of injectable non-insulin antidiabetic drugs: Secondary | ICD-10-CM | POA: Diagnosis not present

## 2023-11-17 DIAGNOSIS — Z7951 Long term (current) use of inhaled steroids: Secondary | ICD-10-CM | POA: Diagnosis not present

## 2023-11-17 DIAGNOSIS — I4892 Unspecified atrial flutter: Secondary | ICD-10-CM | POA: Diagnosis not present

## 2023-11-17 DIAGNOSIS — E785 Hyperlipidemia, unspecified: Secondary | ICD-10-CM | POA: Diagnosis not present

## 2023-11-17 DIAGNOSIS — J45909 Unspecified asthma, uncomplicated: Secondary | ICD-10-CM | POA: Diagnosis not present

## 2023-11-17 DIAGNOSIS — E890 Postprocedural hypothyroidism: Secondary | ICD-10-CM | POA: Diagnosis not present

## 2023-11-17 DIAGNOSIS — E119 Type 2 diabetes mellitus without complications: Secondary | ICD-10-CM | POA: Diagnosis not present

## 2023-11-17 DIAGNOSIS — Z7901 Long term (current) use of anticoagulants: Secondary | ICD-10-CM | POA: Diagnosis not present

## 2023-11-17 DIAGNOSIS — Z9181 History of falling: Secondary | ICD-10-CM | POA: Diagnosis not present

## 2023-11-17 DIAGNOSIS — I10 Essential (primary) hypertension: Secondary | ICD-10-CM | POA: Diagnosis not present

## 2023-11-17 DIAGNOSIS — Z7952 Long term (current) use of systemic steroids: Secondary | ICD-10-CM | POA: Diagnosis not present

## 2023-11-17 DIAGNOSIS — E66813 Obesity, class 3: Secondary | ICD-10-CM | POA: Diagnosis not present

## 2023-11-17 DIAGNOSIS — Z96652 Presence of left artificial knee joint: Secondary | ICD-10-CM | POA: Diagnosis not present

## 2023-11-17 DIAGNOSIS — Z794 Long term (current) use of insulin: Secondary | ICD-10-CM | POA: Diagnosis not present

## 2023-11-17 DIAGNOSIS — G8929 Other chronic pain: Secondary | ICD-10-CM | POA: Diagnosis not present

## 2023-11-17 DIAGNOSIS — Z853 Personal history of malignant neoplasm of breast: Secondary | ICD-10-CM | POA: Diagnosis not present

## 2023-11-17 DIAGNOSIS — I493 Ventricular premature depolarization: Secondary | ICD-10-CM | POA: Diagnosis not present

## 2023-11-18 DIAGNOSIS — G8929 Other chronic pain: Secondary | ICD-10-CM | POA: Diagnosis not present

## 2023-11-18 DIAGNOSIS — E785 Hyperlipidemia, unspecified: Secondary | ICD-10-CM | POA: Diagnosis not present

## 2023-11-18 DIAGNOSIS — Z7951 Long term (current) use of inhaled steroids: Secondary | ICD-10-CM | POA: Diagnosis not present

## 2023-11-18 DIAGNOSIS — Z7901 Long term (current) use of anticoagulants: Secondary | ICD-10-CM | POA: Diagnosis not present

## 2023-11-18 DIAGNOSIS — I10 Essential (primary) hypertension: Secondary | ICD-10-CM | POA: Diagnosis not present

## 2023-11-18 DIAGNOSIS — Z7985 Long-term (current) use of injectable non-insulin antidiabetic drugs: Secondary | ICD-10-CM | POA: Diagnosis not present

## 2023-11-18 DIAGNOSIS — E66813 Obesity, class 3: Secondary | ICD-10-CM | POA: Diagnosis not present

## 2023-11-18 DIAGNOSIS — E890 Postprocedural hypothyroidism: Secondary | ICD-10-CM | POA: Diagnosis not present

## 2023-11-18 DIAGNOSIS — Z96652 Presence of left artificial knee joint: Secondary | ICD-10-CM | POA: Diagnosis not present

## 2023-11-18 DIAGNOSIS — J45909 Unspecified asthma, uncomplicated: Secondary | ICD-10-CM | POA: Diagnosis not present

## 2023-11-18 DIAGNOSIS — T783XXD Angioneurotic edema, subsequent encounter: Secondary | ICD-10-CM | POA: Diagnosis not present

## 2023-11-18 DIAGNOSIS — Z794 Long term (current) use of insulin: Secondary | ICD-10-CM | POA: Diagnosis not present

## 2023-11-18 DIAGNOSIS — Z7952 Long term (current) use of systemic steroids: Secondary | ICD-10-CM | POA: Diagnosis not present

## 2023-11-18 DIAGNOSIS — I493 Ventricular premature depolarization: Secondary | ICD-10-CM | POA: Diagnosis not present

## 2023-11-18 DIAGNOSIS — Z9181 History of falling: Secondary | ICD-10-CM | POA: Diagnosis not present

## 2023-11-18 DIAGNOSIS — E119 Type 2 diabetes mellitus without complications: Secondary | ICD-10-CM | POA: Diagnosis not present

## 2023-11-18 DIAGNOSIS — Z853 Personal history of malignant neoplasm of breast: Secondary | ICD-10-CM | POA: Diagnosis not present

## 2023-11-18 DIAGNOSIS — G4733 Obstructive sleep apnea (adult) (pediatric): Secondary | ICD-10-CM | POA: Diagnosis not present

## 2023-11-18 DIAGNOSIS — Z6841 Body Mass Index (BMI) 40.0 and over, adult: Secondary | ICD-10-CM | POA: Diagnosis not present

## 2023-11-18 DIAGNOSIS — I4892 Unspecified atrial flutter: Secondary | ICD-10-CM | POA: Diagnosis not present

## 2023-11-20 DIAGNOSIS — E66813 Obesity, class 3: Secondary | ICD-10-CM | POA: Diagnosis not present

## 2023-11-20 DIAGNOSIS — Z853 Personal history of malignant neoplasm of breast: Secondary | ICD-10-CM | POA: Diagnosis not present

## 2023-11-20 DIAGNOSIS — Z7901 Long term (current) use of anticoagulants: Secondary | ICD-10-CM | POA: Diagnosis not present

## 2023-11-20 DIAGNOSIS — Z794 Long term (current) use of insulin: Secondary | ICD-10-CM | POA: Diagnosis not present

## 2023-11-20 DIAGNOSIS — Z7951 Long term (current) use of inhaled steroids: Secondary | ICD-10-CM | POA: Diagnosis not present

## 2023-11-20 DIAGNOSIS — T783XXD Angioneurotic edema, subsequent encounter: Secondary | ICD-10-CM | POA: Diagnosis not present

## 2023-11-20 DIAGNOSIS — I493 Ventricular premature depolarization: Secondary | ICD-10-CM | POA: Diagnosis not present

## 2023-11-20 DIAGNOSIS — E119 Type 2 diabetes mellitus without complications: Secondary | ICD-10-CM | POA: Diagnosis not present

## 2023-11-20 DIAGNOSIS — Z6841 Body Mass Index (BMI) 40.0 and over, adult: Secondary | ICD-10-CM | POA: Diagnosis not present

## 2023-11-20 DIAGNOSIS — Z7952 Long term (current) use of systemic steroids: Secondary | ICD-10-CM | POA: Diagnosis not present

## 2023-11-20 DIAGNOSIS — Z7985 Long-term (current) use of injectable non-insulin antidiabetic drugs: Secondary | ICD-10-CM | POA: Diagnosis not present

## 2023-11-20 DIAGNOSIS — I4892 Unspecified atrial flutter: Secondary | ICD-10-CM | POA: Diagnosis not present

## 2023-11-20 DIAGNOSIS — E890 Postprocedural hypothyroidism: Secondary | ICD-10-CM | POA: Diagnosis not present

## 2023-11-20 DIAGNOSIS — G8929 Other chronic pain: Secondary | ICD-10-CM | POA: Diagnosis not present

## 2023-11-20 DIAGNOSIS — J45909 Unspecified asthma, uncomplicated: Secondary | ICD-10-CM | POA: Diagnosis not present

## 2023-11-20 DIAGNOSIS — Z96652 Presence of left artificial knee joint: Secondary | ICD-10-CM | POA: Diagnosis not present

## 2023-11-20 DIAGNOSIS — Z9181 History of falling: Secondary | ICD-10-CM | POA: Diagnosis not present

## 2023-11-20 DIAGNOSIS — I10 Essential (primary) hypertension: Secondary | ICD-10-CM | POA: Diagnosis not present

## 2023-11-20 DIAGNOSIS — E785 Hyperlipidemia, unspecified: Secondary | ICD-10-CM | POA: Diagnosis not present

## 2023-11-20 DIAGNOSIS — G4733 Obstructive sleep apnea (adult) (pediatric): Secondary | ICD-10-CM | POA: Diagnosis not present

## 2023-11-20 NOTE — Telephone Encounter (Signed)
 Left message to return call

## 2023-11-30 ENCOUNTER — Other Ambulatory Visit: Payer: Self-pay | Admitting: Nurse Practitioner

## 2023-12-02 ENCOUNTER — Other Ambulatory Visit: Payer: Self-pay

## 2023-12-02 ENCOUNTER — Encounter: Payer: Self-pay | Admitting: Surgical

## 2023-12-02 ENCOUNTER — Ambulatory Visit: Admitting: Surgical

## 2023-12-02 DIAGNOSIS — M1711 Unilateral primary osteoarthritis, right knee: Secondary | ICD-10-CM

## 2023-12-02 DIAGNOSIS — G8929 Other chronic pain: Secondary | ICD-10-CM

## 2023-12-02 DIAGNOSIS — M25561 Pain in right knee: Secondary | ICD-10-CM

## 2023-12-02 MED ORDER — TRIAMCINOLONE ACETONIDE 40 MG/ML IJ SUSP
40.0000 mg | INTRAMUSCULAR | Status: AC | PRN
  Administered 2023-12-02: 40 mg via INTRA_ARTICULAR

## 2023-12-02 MED ORDER — LIDOCAINE HCL 1 % IJ SOLN
5.0000 mL | INTRAMUSCULAR | Status: AC | PRN
  Administered 2023-12-02: 5 mL

## 2023-12-02 MED ORDER — BUPIVACAINE HCL 0.25 % IJ SOLN
4.0000 mL | INTRAMUSCULAR | Status: AC | PRN
  Administered 2023-12-02: 4 mL via INTRA_ARTICULAR

## 2023-12-02 NOTE — Progress Notes (Signed)
 Office Visit Note   Patient: [name]           Date of Birth: September 23, 1955           MRN: [id number] Visit Date: 12/02/2023 Requested by: Cook, Jayce G, DO [address] [place],  [place] [address] PCP: Bluford Jacqulyn MATSU, DO  Subjective: Chief Complaint  Patient presents with   Right Knee - Pain    HPI: [name] is a 68 y.o. female who presents to the office reporting right knee pain.  Patient has right knee pain and history of arthritis.  She has had left total knee arthroplasty in 2003 by Dr. Sheena.  Has not had surgery on her right knee.  She describes anterior medial pain primarily without radiation.  No groin pain.  No history of prior hip surgery.  She is in pain management.  She ambulates primarily with a wheelchair and is working with physical therapy and Occupational Therapy as well as completing home exercise program in order to progress her mobility level.  She has been able to get to the point where she can stand in place for extended periods of time which was not possible before PT.  Aside from her knee pain, she does have history of diabetes with last A1c below 8.0 according to her.  She has an endocrinologist.  Denies any stroke history but she does have A-fib and takes Eliquis .  She is currently focusing on losing weight in order to pursue knee replacement on her right knee..                ROS: All systems reviewed are negative as they relate to the chief complaint within the history of present illness.  Patient denies fevers or chills.  Assessment & Plan: Visit Diagnoses:  1. Arthritis of right knee   2. Chronic pain of right knee     Plan: Impression is 68 year old female who is here today for evaluation of right knee pain.  Has history of right knee arthritis with severe end-stage arthritis primarily in the medial compartment.  Discussed options available to patient.  Has had prior gel injections which have not helped.  We will try cortisone injection  today.  Patient tolerated procedure well..  Did discuss that this will temporarily increase her blood glucose and she will monitor this and contact her endocrinologist if there are any problems. Her goal weight at her height would be to get to about 240 pounds in order to be ready for surgical intervention in the form of knee replacement.  Needs BMI around 40.  Follow-Up Instructions: No follow-ups on file.   Orders:  Orders Placed This Encounter  Procedures   XR Knee 1-2 Views Right   No orders of the defined types were placed in this encounter.     Procedures: Large Joint Inj: R knee on 12/02/2023 1:59 PM Indications: diagnostic evaluation, joint swelling and pain Details: 18 G 1.5 in needle, superolateral approach  Arthrogram: No  Medications: 5 mL lidocaine  1 %; 4 mL bupivacaine  0.25 %; 40 mg triamcinolone  acetonide 40 MG/ML Outcome: tolerated well, no immediate complications Procedure, treatment alternatives, risks and benefits explained, specific risks discussed. Consent was given by the patient. Immediately prior to procedure a time out was called to verify the correct patient, procedure, equipment, support staff and site/side marked as required. Patient was prepped and draped in the usual sterile fashion.       Clinical Data: No additional findings.  Objective: Vital Signs: There were no vitals taken for this visit.  Physical Exam:  Constitutional: Patient appears well-developed HEENT:  Head: Normocephalic Eyes:EOM are normal Neck: Normal range of motion Cardiovascular: Normal rate Pulmonary/chest: Effort normal Neurologic: Patient is alert Skin: Skin is warm Psychiatric: Patient has normal mood and affect  Ortho Exam: Ortho exam demonstrates right knee with 3 degrees of knee extension and about 60 degrees of knee flexion.  She has decent hip mobility without pain.  Stable to anterior and posterior drawer sign.  Stable to varus and valgus stress.  Palpable DP  pulse rated 1+.  She has intact ankle dorsiflexion and plantarflexion.  Intact quad strength rated 5/5.    Specialty Comments:  No specialty comments available.  Imaging: No results found.   PMFS History: Patient Active Problem List   Diagnosis Date Noted   Angioedema 09/28/2023   Hyperlipidemia associated with type 2 diabetes mellitus (HCC) 08/19/2023   Osteoarthritis of right knee 11/07/2021   Morbid obesity (HCC) 11/06/2021   H/O atrial flutter 09/01/2021   Diabetic neuropathy (HCC) 04/30/2021   Healthcare maintenance 10/31/2020   Depression 05/24/2015   Obstructive sleep apnea 04/04/2014   Mitral regurgitation 11/08/2013   Malignant neoplasm of upper-outer quadrant of right breast in female, estrogen receptor positive (HCC) 01/31/2013   Graves' eye disease 05/20/2011   Postoperative hypothyroidism 03/13/2011   Asthma 12/08/2010   Hypertension    Type 2 diabetes mellitus with hyperlipidemia (HCC)    Tobacco abuse, in remission    Past Medical History:  Diagnosis Date   Asthma    prn neb. and inhaler   Breast cancer (HCC) 01/13/2013   right   Dehydration 04/13/2013   GERD (gastroesophageal reflux disease)    Graves' disease    Hyperlipidemia    Hypertension    on multiple meds., has been on med. > 14 yr.   Non-insulin  dependent type 2 diabetes mellitus (HCC)    Obesity    Ophthalmic manifestation of Graves disease    Osteoarthritis 01/13/2001   left knee   Radiation 10/11/13-11/29/13   Right Breast   Sleep apnea 04/14/2014   mod  , wears cpap 10 , ramps from 4 , nasal mask   Wears glasses     Family History  Problem Relation Age of Onset   Diabetes Mother    Heart disease Father    Lung cancer Father    Diabetes Sister    Hypertension Sister    Asthma Sister    Rheum arthritis Daughter    Hypertension Daughter    Fibromyalgia Daughter    Hypertension Daughter     Past Surgical History:  Procedure Laterality Date   CARDIOVERSION N/A 11/08/2013    Procedure: CARDIOVERSION;  Surgeon: Redell GORMAN Shallow, MD;  Location: [facility] ENDOSCOPY;  Service: Cardiovascular;  Laterality: N/A;   CHOLECYSTECTOMY N/A 11/13/2021   Procedure: LAPAROSCOPIC CHOLECYSTECTOMY;  Surgeon: Kallie Manuelita BROCKS, MD;  Location: [facility];  Service: General;  Laterality: N/A;   COLONOSCOPY  2007   COLONOSCOPY W/ POLYPECTOMY  2009   EXCISION OF KELOID N/A 05/18/2014   Procedure: EXCISION OF KELOID;  Surgeon: Debby Shipper, MD;  Location: [facility];  Service: General;  Laterality: N/A;   PORT-A-CATH REMOVAL Right 05/18/2014   Procedure: REMOVAL PORT-A-CATH;  Surgeon: Debby Shipper, MD;  Location: [facility];  Service: General;  Laterality: Right;   PORTACATH PLACEMENT Right 02/17/2013   Procedure: INSERTION PORT-A-CATH;  Surgeon: Debby LABOR. Cornett, MD;  Location: [facility];  Service: General;  Laterality: Right;   TEE WITHOUT CARDIOVERSION N/A 11/08/2013   Procedure: TRANSESOPHAGEAL ECHOCARDIOGRAM (TEE);  Surgeon: Redell GORMAN Shallow, MD;  [facility];  Service: Cardiovascular;  Laterality: N/A;   TOTAL KNEE ARTHROPLASTY Left 2003   TOTAL THYROIDECTOMY     TUBAL LIGATION  1985   Social History   Occupational History   Occupation: Disability  Tobacco Use   Smoking status: Former    Current packs/day: 0.00    Average packs/day: 0.3 packs/day for 20.0 years (5.0 ttl pk-yrs)    Types: Cigarettes    Start date: 02/10/1971    Quit date: 02/10/1991    Years since quitting: 32.8    Passive exposure: Past   Smokeless tobacco: Never  Vaping Use   Vaping status: Never Used  Substance and Sexual Activity   Alcohol  use: No   Drug use: No   Sexual activity: Not Currently    Partners: Male    Birth control/protection: Surgical

## 2023-12-04 ENCOUNTER — Encounter: Payer: Self-pay | Admitting: Family Medicine

## 2023-12-07 ENCOUNTER — Telehealth: Payer: Self-pay | Admitting: Family Medicine

## 2023-12-07 NOTE — Telephone Encounter (Signed)
 Patient is requesting a prescription for benazepril  HCL 40 mg not on her current medication list. This was prescribe by different provider.

## 2023-12-08 NOTE — Telephone Encounter (Signed)
 Cook, Jayce G, DO     12/07/23 10:13 AM She can't have this medication. Had angioedema

## 2023-12-09 NOTE — Telephone Encounter (Signed)
 Left a message for pt to return call to inform per doctor's notes re: benazapril - message sent via my chart

## 2023-12-16 ENCOUNTER — Telehealth: Payer: Self-pay | Admitting: Family Medicine

## 2023-12-16 MED ORDER — PRAVASTATIN SODIUM 40 MG PO TABS: 40.0000 mg | ORAL_TABLET | Freq: Every day | ORAL | 0 refills | Status: AC

## 2023-12-16 NOTE — Telephone Encounter (Signed)
 Refill on    pravastatin  (PRAVACHOL ) 40 MG tablet    [facility]

## 2023-12-19 ENCOUNTER — Other Ambulatory Visit: Payer: Self-pay | Admitting: Family Medicine

## 2023-12-19 DIAGNOSIS — M1711 Unilateral primary osteoarthritis, right knee: Secondary | ICD-10-CM

## 2023-12-19 DIAGNOSIS — M129 Arthropathy, unspecified: Secondary | ICD-10-CM

## 2023-12-24 ENCOUNTER — Ambulatory Visit: Payer: Self-pay

## 2023-12-24 ENCOUNTER — Other Ambulatory Visit: Payer: Self-pay | Admitting: Family Medicine

## 2023-12-24 MED ORDER — CEPHALEXIN 500 MG PO CAPS: 500.0000 mg | ORAL_CAPSULE | Freq: Two times a day (BID) | ORAL | 0 refills | Status: AC

## 2023-12-24 NOTE — Telephone Encounter (Signed)
 FYI Only or Action Required?: Action required by provider: clinical question for provider and update on patient condition.  Patient was last seen in primary care on 10/12/2023 by Cook, Jayce G, DO.  Called Nurse Triage reporting Urinary Frequency.  Symptoms began several weeks ago.  Interventions attempted: Nothing.  Symptoms are: unchanged.  Triage Disposition: See Physician Within 24 Hours  Patient/caregiver understands and will follow disposition?: No, refuses disposition  Copied from CRM #[id number]. Topic: Clinical - Red Word Triage >> Dec 24, 2023 10:17 AM Alfonso HERO wrote: Red Word that prompted transfer to Nurse Triage: possible UTI, dark urine and frequent urges to go to the bathroom Reason for Disposition  Urinating more frequently than usual (i.e., frequency) OR new-onset of the feeling of an urgent need to urinate (i.e., urgency)  Answer Assessment - Initial Assessment Questions Patient's phone number: [phone]  Patient is disabled and it is difficult for her to travel. She would like an order for urinalysis and to collect the urine at home to be evaluated for UTI  1. SYMPTOM: What's the main symptom you're concerned about? (e.g., frequency, incontinence)     Dark urine, pain when beginning to urinate, frequency/urgency 2. ONSET: When did the  symptoms  start?     Two weeks ago  3. PAIN: Is there any pain? If Yes, ask: How bad is it? (Scale: 1-10; mild, moderate, severe)     Denies abdominal or back/flank pain 4. CAUSE: What do you think is causing the symptoms?     Concern for UTI 5. OTHER SYMPTOMS: Do you have any other symptoms? (e.g., blood in urine, fever, flank pain, pain with urination)     denies  Protocols used: Urinary Symptoms-A-AH

## 2023-12-25 ENCOUNTER — Encounter: Payer: Self-pay | Admitting: Family Medicine

## 2023-12-25 NOTE — Telephone Encounter (Signed)
 Duplicate- see other message. Patient is aware.

## 2023-12-25 NOTE — Telephone Encounter (Signed)
 Patient notified

## 2023-12-29 ENCOUNTER — Other Ambulatory Visit: Payer: Self-pay | Admitting: Family Medicine

## 2023-12-29 DIAGNOSIS — I10 Essential (primary) hypertension: Secondary | ICD-10-CM

## 2023-12-31 DIAGNOSIS — E876 Hypokalemia: Secondary | ICD-10-CM | POA: Diagnosis not present

## 2024-01-20 ENCOUNTER — Telehealth: Payer: Self-pay | Admitting: *Deleted

## 2024-01-20 NOTE — Telephone Encounter (Unsigned)
 Copied from CRM [id number]. Topic: General - Other >> Jan 19, 2024  8:47 AM Antwanette L wrote: Reason for CRM: Crystal from [facility] called regarding the patient request for lower tiered coverage review for  apixaban  (ELIQUIS ) 5 MG TABS tablet and dapagliflozin  propanediol (FARXIGA ) 5 MG TABS tablet. The request was denied. Crystal will fax the denial letter. She can be reached at [phone] opt [phone]

## 2024-01-24 ENCOUNTER — Other Ambulatory Visit: Payer: Self-pay | Admitting: Family Medicine

## 2024-01-24 MED ORDER — LANCETS MISC: 3 refills | Status: AC

## 2024-01-24 MED ORDER — LANCET DEVICE MISC: 0 refills | Status: AC

## 2024-01-24 MED ORDER — BLOOD GLUCOSE TEST VI STRP: ORAL_STRIP | 3 refills | Status: AC

## 2024-01-24 MED ORDER — BLOOD GLUCOSE MONITORING SUPPL DEVI: 0 refills | Status: AC

## 2024-02-19 ENCOUNTER — Ambulatory Visit: Admitting: Family Medicine

## 2024-02-29 ENCOUNTER — Ambulatory Visit: Admitting: Family Medicine

## 2024-03-18 ENCOUNTER — Ambulatory Visit: Admitting: Surgical

## 2024-05-27 ENCOUNTER — Encounter
